# Patient Record
Sex: Female | Born: 1945
Health system: Southern US, Community
[De-identification: ages and names within clinical notes are randomized; demographics above are authoritative.]

## PROBLEM LIST (undated history)

## (undated) DIAGNOSIS — R42 Dizziness and giddiness: Secondary | ICD-10-CM

## (undated) DIAGNOSIS — F329 Major depressive disorder, single episode, unspecified: Secondary | ICD-10-CM

## (undated) DIAGNOSIS — J449 Chronic obstructive pulmonary disease, unspecified: Secondary | ICD-10-CM

## (undated) DIAGNOSIS — F32A Depression, unspecified: Secondary | ICD-10-CM

## (undated) DIAGNOSIS — I071 Rheumatic tricuspid insufficiency: Secondary | ICD-10-CM

## (undated) DIAGNOSIS — D649 Anemia, unspecified: Secondary | ICD-10-CM

## (undated) DIAGNOSIS — A770 Spotted fever due to Rickettsia rickettsii: Secondary | ICD-10-CM

## (undated) DIAGNOSIS — G4730 Sleep apnea, unspecified: Secondary | ICD-10-CM

## (undated) DIAGNOSIS — K219 Gastro-esophageal reflux disease without esophagitis: Secondary | ICD-10-CM

## (undated) DIAGNOSIS — T7840XA Allergy, unspecified, initial encounter: Secondary | ICD-10-CM

## (undated) DIAGNOSIS — M810 Age-related osteoporosis without current pathological fracture: Secondary | ICD-10-CM

## (undated) DIAGNOSIS — I517 Cardiomegaly: Secondary | ICD-10-CM

## (undated) DIAGNOSIS — I272 Pulmonary hypertension, unspecified: Secondary | ICD-10-CM

## (undated) DIAGNOSIS — K761 Chronic passive congestion of liver: Secondary | ICD-10-CM

## (undated) DIAGNOSIS — G4700 Insomnia, unspecified: Secondary | ICD-10-CM

## (undated) DIAGNOSIS — K760 Fatty (change of) liver, not elsewhere classified: Secondary | ICD-10-CM

## (undated) DIAGNOSIS — E785 Hyperlipidemia, unspecified: Secondary | ICD-10-CM

## (undated) DIAGNOSIS — I2721 Secondary pulmonary arterial hypertension: Secondary | ICD-10-CM

## (undated) DIAGNOSIS — G43909 Migraine, unspecified, not intractable, without status migrainosus: Secondary | ICD-10-CM

## (undated) DIAGNOSIS — E119 Type 2 diabetes mellitus without complications: Secondary | ICD-10-CM

## (undated) DIAGNOSIS — I5032 Chronic diastolic (congestive) heart failure: Secondary | ICD-10-CM

## (undated) DIAGNOSIS — G459 Transient cerebral ischemic attack, unspecified: Secondary | ICD-10-CM

## (undated) DIAGNOSIS — F419 Anxiety disorder, unspecified: Secondary | ICD-10-CM

## (undated) DIAGNOSIS — I10 Essential (primary) hypertension: Secondary | ICD-10-CM

## (undated) HISTORY — DX: Type 2 diabetes mellitus without complications: E11.9

## (undated) HISTORY — DX: Hyperlipidemia, unspecified: E78.5

## (undated) HISTORY — DX: Transient cerebral ischemic attack, unspecified: G45.9

## (undated) HISTORY — PX: TUBAL LIGATION: SHX77

## (undated) HISTORY — PX: CORONARY ANGIOPLASTY: SHX604

## (undated) HISTORY — DX: Major depressive disorder, single episode, unspecified: F32.9

## (undated) HISTORY — DX: Migraine, unspecified, not intractable, without status migrainosus: G43.909

## (undated) HISTORY — DX: Anxiety disorder, unspecified: F41.9

## (undated) HISTORY — PX: ABDOMINAL HYSTERECTOMY: SHX81

## (undated) HISTORY — DX: Insomnia, unspecified: G47.00

## (undated) HISTORY — DX: Age-related osteoporosis without current pathological fracture: M81.0

## (undated) HISTORY — DX: Spotted fever due to Rickettsia rickettsii: A77.0

## (undated) HISTORY — DX: Allergy, unspecified, initial encounter: T78.40XA

## (undated) HISTORY — DX: Depression, unspecified: F32.A

## (undated) HISTORY — DX: Anemia, unspecified: D64.9

## (undated) HISTORY — DX: Essential (primary) hypertension: I10

---

## 2004-08-03 ENCOUNTER — Emergency Department: Payer: Self-pay | Admitting: Emergency Medicine

## 2004-10-06 ENCOUNTER — Ambulatory Visit: Payer: Self-pay | Admitting: Family Medicine

## 2005-08-16 ENCOUNTER — Ambulatory Visit: Payer: Self-pay | Admitting: Cardiovascular Disease

## 2007-06-28 DIAGNOSIS — M1990 Unspecified osteoarthritis, unspecified site: Secondary | ICD-10-CM | POA: Insufficient documentation

## 2007-07-11 ENCOUNTER — Ambulatory Visit: Payer: Self-pay | Admitting: Gastroenterology

## 2010-05-29 ENCOUNTER — Ambulatory Visit: Payer: Self-pay | Admitting: Family Medicine

## 2010-07-03 ENCOUNTER — Ambulatory Visit: Payer: Self-pay | Admitting: Internal Medicine

## 2010-07-13 ENCOUNTER — Ambulatory Visit: Payer: Self-pay | Admitting: Unknown Physician Specialty

## 2010-08-05 ENCOUNTER — Ambulatory Visit: Payer: Self-pay | Admitting: Unknown Physician Specialty

## 2011-03-19 ENCOUNTER — Emergency Department: Payer: Self-pay | Admitting: Unknown Physician Specialty

## 2011-11-14 LAB — HM MAMMOGRAPHY

## 2012-02-10 ENCOUNTER — Ambulatory Visit: Payer: Self-pay | Admitting: Medical

## 2012-02-10 ENCOUNTER — Emergency Department: Payer: Self-pay | Admitting: Emergency Medicine

## 2012-02-10 LAB — BASIC METABOLIC PANEL
Anion Gap: 7 (ref 7–16)
Calcium, Total: 9.4 mg/dL (ref 8.5–10.1)
Chloride: 102 mmol/L (ref 98–107)
Co2: 31 mmol/L (ref 21–32)
EGFR (African American): >60
EGFR (Non-African Amer.): >60
Sodium: 140 mmol/L (ref 136–145)

## 2012-02-10 LAB — CBC
HGB: 11.6 g/dL — ABNORMAL LOW (ref 12.0–16.0)
MCH: 29.6 pg (ref 26.0–34.0)
MCHC: 34.1 g/dL (ref 32.0–36.0)
MCV: 87 fL (ref 80–100)
Platelet: 250 10*3/uL (ref 150–440)
RBC: 3.91 10*6/uL (ref 3.80–5.20)

## 2012-02-10 LAB — CK TOTAL AND CKMB (NOT AT ARMC)
CK, Total: 99 U/L (ref 21–215)
CK-MB: <0.5 ng/mL — ABNORMAL LOW (ref 0.5–3.6)

## 2012-02-14 DIAGNOSIS — E1165 Type 2 diabetes mellitus with hyperglycemia: Secondary | ICD-10-CM

## 2012-02-14 HISTORY — DX: Type 2 diabetes mellitus with hyperglycemia: E11.65

## 2012-03-20 DIAGNOSIS — E039 Hypothyroidism, unspecified: Secondary | ICD-10-CM | POA: Insufficient documentation

## 2012-04-17 ENCOUNTER — Observation Stay: Payer: Self-pay | Admitting: Internal Medicine

## 2012-04-17 LAB — CBC
HCT: 33.4 % — ABNORMAL LOW (ref 35.0–47.0)
HGB: 11 g/dL — ABNORMAL LOW (ref 12.0–16.0)
MCH: 28.6 pg (ref 26.0–34.0)
MCHC: 33.1 g/dL (ref 32.0–36.0)
MCV: 86 fL (ref 80–100)
Platelet: 242 10*3/uL (ref 150–440)
RBC: 3.87 10*6/uL (ref 3.80–5.20)
RDW: 14.4 % (ref 11.5–14.5)
WBC: 5 10*3/uL (ref 3.6–11.0)

## 2012-04-17 LAB — COMPREHENSIVE METABOLIC PANEL
Albumin: 3.7 g/dL (ref 3.4–5.0)
Alkaline Phosphatase: 58 U/L (ref 50–136)
Anion Gap: 8 (ref 7–16)
BUN: 12 mg/dL (ref 7–18)
Calcium, Total: 8.8 mg/dL (ref 8.5–10.1)
Creatinine: 0.79 mg/dL (ref 0.60–1.30)
Glucose: 105 mg/dL — ABNORMAL HIGH (ref 65–99)
Osmolality: 287 (ref 275–301)
Potassium: 3.8 mmol/L (ref 3.5–5.1)
Sodium: 144 mmol/L (ref 136–145)
Total Protein: 7.2 g/dL (ref 6.4–8.2)

## 2012-04-17 LAB — URINALYSIS, COMPLETE
Bacteria: NONE SEEN
Bilirubin,UR: NEGATIVE
Blood: NEGATIVE
Glucose,UR: NEGATIVE mg/dL (ref 0–75)
Ketone: NEGATIVE
Nitrite: NEGATIVE
Specific Gravity: 1.024 (ref 1.003–1.030)
Squamous Epithelial: <1
WBC UR: 3 /HPF (ref 0–5)

## 2012-04-17 LAB — PROTIME-INR
INR: 0.9
Prothrombin Time: 12.2 secs (ref 11.5–14.7)

## 2012-04-17 LAB — APTT: Activated PTT: 27.3 secs (ref 23.6–35.9)

## 2012-04-18 LAB — HEMOGLOBIN A1C: Hemoglobin A1C: 6.5 % — ABNORMAL HIGH (ref 4.2–6.3)

## 2012-04-18 LAB — LIPID PANEL
Cholesterol: 185 mg/dL (ref 0–200)
HDL Cholesterol: 42 mg/dL (ref 40–60)
Ldl Cholesterol, Calc: 118 mg/dL — ABNORMAL HIGH (ref 0–100)
Triglycerides: 126 mg/dL (ref 0–200)
VLDL Cholesterol, Calc: 25 mg/dL (ref 5–40)

## 2012-04-18 LAB — CBC WITH DIFFERENTIAL/PLATELET
Basophil #: 0 10*3/uL (ref 0.0–0.1)
Eosinophil #: 0.1 10*3/uL (ref 0.0–0.7)
Eosinophil %: 1.3 %
MCH: 28.8 pg (ref 26.0–34.0)
Monocyte #: 0.3 x10 3/mm (ref 0.2–0.9)
Neutrophil #: 3.2 10*3/uL (ref 1.4–6.5)
Neutrophil %: 64.1 %
Platelet: 215 10*3/uL (ref 150–440)
RBC: 3.47 10*6/uL — ABNORMAL LOW (ref 3.80–5.20)
RDW: 14.5 % (ref 11.5–14.5)
WBC: 4.9 10*3/uL (ref 3.6–11.0)

## 2013-02-13 ENCOUNTER — Ambulatory Visit: Payer: Self-pay | Admitting: Gastroenterology

## 2013-02-18 LAB — HM COLONOSCOPY

## 2013-04-10 ENCOUNTER — Ambulatory Visit: Payer: Self-pay | Admitting: Internal Medicine

## 2013-04-10 LAB — CBC CANCER CENTER
Basophil #: 0 x10 3/mm (ref 0.0–0.1)
Eosinophil #: 0 x10 3/mm (ref 0.0–0.7)
Eosinophil %: 0.6 %
HCT: 35.5 % (ref 35.0–47.0)
Lymphocyte #: 1.8 x10 3/mm (ref 1.0–3.6)
Lymphocyte %: 34.2 %
MCH: 28.7 pg (ref 26.0–34.0)
MCHC: 32.5 g/dL (ref 32.0–36.0)
MCV: 88 fL (ref 80–100)
Monocyte #: 0.3 x10 3/mm (ref 0.2–0.9)
Monocyte %: 4.7 %
Neutrophil #: 3.2 x10 3/mm (ref 1.4–6.5)
Neutrophil %: 60.2 %
Platelet: 274 x10 3/mm (ref 150–440)
RBC: 4.02 10*6/uL (ref 3.80–5.20)
RDW: 14.3 % (ref 11.5–14.5)

## 2013-04-10 LAB — RETICULOCYTES
Absolute Retic Count: 0.0666 10*6/uL (ref 0.019–0.186)
Reticulocyte: 1.66 % (ref 0.4–3.1)

## 2013-05-06 ENCOUNTER — Ambulatory Visit: Payer: Self-pay | Admitting: Internal Medicine

## 2013-11-25 DIAGNOSIS — R9431 Abnormal electrocardiogram [ECG] [EKG]: Secondary | ICD-10-CM

## 2013-11-25 DIAGNOSIS — R002 Palpitations: Secondary | ICD-10-CM

## 2013-11-25 HISTORY — DX: Abnormal electrocardiogram (ECG) (EKG): R94.31

## 2013-11-25 HISTORY — DX: Palpitations: R00.2

## 2014-04-18 ENCOUNTER — Ambulatory Visit: Payer: Self-pay | Admitting: Family Medicine

## 2014-04-24 LAB — HM DIABETES EYE EXAM

## 2014-04-29 ENCOUNTER — Telehealth: Payer: Self-pay | Admitting: *Deleted

## 2014-04-29 ENCOUNTER — Encounter: Payer: Self-pay | Admitting: Podiatry

## 2014-04-29 ENCOUNTER — Ambulatory Visit (INDEPENDENT_AMBULATORY_CARE_PROVIDER_SITE_OTHER): Payer: Medicare Other | Admitting: Podiatry

## 2014-04-29 ENCOUNTER — Ambulatory Visit (INDEPENDENT_AMBULATORY_CARE_PROVIDER_SITE_OTHER): Payer: BC Managed Care – PPO

## 2014-04-29 VITALS — BP 144/86 | HR 57 | Resp 16 | Ht 67.0 in | Wt 181.0 lb

## 2014-04-29 DIAGNOSIS — E119 Type 2 diabetes mellitus without complications: Secondary | ICD-10-CM

## 2014-04-29 DIAGNOSIS — M722 Plantar fascial fibromatosis: Secondary | ICD-10-CM

## 2014-04-29 NOTE — Progress Notes (Signed)
Subjective:    Patient ID: MIKELL CAMP, female    DOB: 04-05-1946, 69 y.o.   MRN: [id number]  HPI Comments: 68 year old female presents the office today with complaints of a cyst on the bottom of her left foot which has been present for proximally 2 weeks. She does state the area has decreased in size to the last couple weeks and the pain is resolving. She does that she continues have mild discomfort to the area particularly with pressure. She has not had any prior treatment. Due to her history diabetes she is concerned about possible infection. She denies any redness or any skin changes overlying the area. She denies any systemic complaints as fevers, chills, nausea, vomiting. No other complaints at this time.  Foot Pain Associated symptoms include fatigue.      Review of Systems  Constitutional: Positive for fatigue.  All other systems reviewed and are negative.      Objective:   Physical Exam  AAO 3, NAD DP/PT pulses palpable bilaterally, CRT less than 3 seconds Protective sensation intact with Simms Weinstein monofilament, vibratory sensation intact, Achilles tendon reflex intact. There is a small, firm, mobile soft tissue mass within the plantar aspect of the left forefoot just proximal to the metatarsal heads. There is no fluctuance or crepitus identified. There is no overlying skin ulceration. No overlying erythema, edema, streaking. There is no clinical signs of infection. There is mild tenderness upon deep palpation to the area. No other areas identified. No open lesions. MMT 5/5, ROM WNL      Assessment & Plan:  68 year old female left foot plantar soft tissue mass -X-rays were obtained and reviewed with the patient. -Conservative versus surgical treatment discussed including alternatives, risks, complications. -Patient is concerned about what the actual mass is. It feels like it is a fibroma. We'll obtain an ultrasound to further evaluate the mass. Discussed with the  patient to monitor for any clinical signs or symptoms of infection and directed to call the office if any are to occur or go directly to the emergency room. -Dispensed offloading pads to help alleviate the pressure while walking/standing. -Follow-up after ultrasound. In the meantime, call the office with any questions, concerns, changes symptoms.

## 2014-04-29 NOTE — Patient Instructions (Signed)
Monitor for any signs/symptoms of infection. Call the office immediately if any occur or go directly to the emergency room. Call with any questions/concerns.

## 2014-04-29 NOTE — Telephone Encounter (Signed)
Called and left message for pt letting her know ultrasound sch for 12.1.15 at 3:15. Arrival time 3:00 [place] location. [address] - [facility].

## 2014-05-06 ENCOUNTER — Ambulatory Visit: Payer: Self-pay | Admitting: Family Medicine

## 2014-05-06 ENCOUNTER — Ambulatory Visit: Payer: Self-pay | Admitting: Podiatry

## 2014-05-08 ENCOUNTER — Encounter: Payer: Self-pay | Admitting: Podiatry

## 2014-05-13 ENCOUNTER — Ambulatory Visit (INDEPENDENT_AMBULATORY_CARE_PROVIDER_SITE_OTHER): Payer: BC Managed Care – PPO | Admitting: Podiatry

## 2014-05-13 VITALS — BP 134/68 | HR 66 | Resp 16

## 2014-05-13 DIAGNOSIS — E119 Type 2 diabetes mellitus without complications: Secondary | ICD-10-CM

## 2014-05-13 DIAGNOSIS — M722 Plantar fascial fibromatosis: Secondary | ICD-10-CM

## 2014-05-13 NOTE — Patient Instructions (Signed)
Monitor the mass for any changes. If there is any increase in size, pain or skin changes over the area call the office.

## 2014-05-13 NOTE — Progress Notes (Signed)
Patient ID: Jamie Murray, female   DOB: 11/14/1945, 68 y.o.   MRN: [id number]  Subjective: 68 year old female returns the office they for follow-up evaluation to discuss results of the ultrasound. She states that since last appointment the area has decreased in size and swelling and she only has pain to the area with direct pressure over the mass. She denies stepping on any foreign objects and she states that she routinely checks her feet every day to ensure this. No acute changes since last appointment. No other complaints at this time. Denies any systemic complaints as fevers, chills, nausea, vomiting.  Objective: AAO x3, NAD DP/PT pulses palpable bilaterally, CRT less than 3 seconds Protective sensation intact with Simms Weinstein monofilament, vibratory sensation intact, Achilles tendon reflex intact Small firm mobile soft tissue mass in the plantar aspect left forefoot 15 the first and second metatarsal heads proximally. Overlying skin is intact for any skin changes. The mass is appear to be somewhat smaller compared to prior appointment. There is only pain with direct palpation of the mass. There is no areas of fluctuance, crepitus. There is no overlying erythema, increase in warmth. Patient is able to wear regular shoes.difficulty and presents today wearing a high-heeled shoe. MMT 5/5, ROM WNL No open lesions or pre-ulcerative lesions. No pain with calf compression, swelling, warmth, erythema.  Assessment: 68 year old female with left plantar soft tissue mass.  Plan: -Ultrasound results were reviewed with the patient. Ultrasound revealed possible foreign body reaction (which the patient adamantly states that she did not step on any objects and there is no signs of a foreign body on x-ray), versus inclusion cyst. A morton neuroma or fibroma is less likely. Discussed with the patient we can obtain an MRI to further evaluate the area. However at this time. This appear to be improving symptoms  will hold off at this time. Discussed the patient to continue to monitor the area for any changes in the call the office should any occur. Discussed with her that if the area becomes more painful, increase in size, any overlying skin changes toe and is now and we will order an MRI. -Dispensed offloading pads to help protect the area. -Follow-up as needed. In the meantime, call the office with any questions, concerns, change in symptoms.

## 2014-06-06 ENCOUNTER — Ambulatory Visit: Payer: Self-pay | Admitting: Family Medicine

## 2014-06-06 DIAGNOSIS — G459 Transient cerebral ischemic attack, unspecified: Secondary | ICD-10-CM

## 2014-06-06 HISTORY — DX: Transient cerebral ischemic attack, unspecified: G45.9

## 2014-09-23 NOTE — H&P (Signed)
PATIENT NAME:  Jamie Murray, Jamie Murray MR#:  [id number] DATE OF BIRTH:  1946/03/30  DATE OF ADMISSION:  04/17/2012  PRIMARY CARE PHYSICIAN: [facility]  ER PHYSICIAN: Marta Antu, MD  CHIEF COMPLAINT: Slurred speech and confusion.   HISTORY OF PRESENT ILLNESS: A 69 year old female with history of hypertension and diabetes who came because the patient had an episode of slurred speech, confusion  ____________________________ Epifanio Lesches, MD sk:slb D: 04/17/2012 14:30:00 ET T: 04/17/2012 14:49:47 ET JOB#: [id number]  cc: Epifanio Lesches, MD, <Dictator> Epifanio Lesches MD ELECTRONICALLY SIGNED 05/22/2012 14:49

## 2014-09-23 NOTE — H&P (Signed)
PATIENT NAME:  Jamie Murray, ROYAL MR#:  [id number] DATE OF BIRTH:  [birth year]  DATE OF ADMISSION:  04/17/2012  PRIMARY CARE PHYSICIAN: Golden Pop, MD  ER PHYSICIAN: Marta Antu, MD  CHIEF COMPLAINT: Altered mental status, slurred speech, and headache.   HISTORY OF PRESENT ILLNESS: This is a 69 year old female with hypertension and diabetes brought by the daughter. The patient and the daughter was in [place] on Sunday and after eating dinner Sunday night the patient started to have sudden onset of confusion and her speech was slurred and also not right, according to the daughter. The patient was noticed to have weakness and numbness on the left hand which lasted about 15 minutes. After that the patient's speech was clear and she slept okay and then the family flew back to [facility] yesterday morning. The  patient says that she ha been having headaches since Sunday and she felt dizzy Sunday night. She did not have any blurred vision and speech was slurred and weakness of the left hand. The patient denies any loss of consciousness, no seizure activity, no deviation of mouth, and no fever. The patient's blood sugar was 220, according to the daughter.  PAST MEDICAL HISTORY:  1. Hypertension. 2. Diabetes.  3. Hypothyroidism.  ALLERGIES: No known drug allergies.   SOCIAL HISTORY: No smoking, no drinking, and no drugs.   PAST SURGICAL HISTORY: Hysterectomy.   FAMILY HISTORY: No hypertension or diabetes.   MEDICATIONS:  1. Aspirin 81 mg daily.  2. HCTZ/Losartan 12.5/150 mg p.o. twice a day.  3. Januvia 100 mg p.o. daily.  4. Levothyroxine 50 mcg p.o. daily.  5. Metformin 1 gram p.o. twice a day. 6. Multivitamin 1 tablet daily.  7. Trazodone 100 mg p.o. daily. 8. Vitamin B12 1,000 mcg p.o. daily.  9. Ambien 10 mg daily.   REVIEW OF SYSTEMS: CONSTITUTIONAL: Has no fever but complains of fatigue. EYES: No blurred vision. ENT: No tinnitus. No epistaxis. No difficulty swallowing. RESPIRATORY: No  cough. No wheezing. CARDIOVASCULAR: No chest pain. No orthopnea. GASTROINTESTINAL: Feels nauseous all the time and poor p.o. intake. GENITOURINARY: No dysuria. ENDOCRINE: No polyuria or nocturia. INTEGUMENTARY: No skin dryness. MUSCULOSKELETAL: No joint pain. NEUROLOGIC: Had numbness in the left hand on Sunday which is resolved. Now no weakness. Had dysarthria on Sunday. No epilepsy. No tremor, vertigo, or ataxia. The patient has been complaining of headache for last two days. PSYCH: No anxiety or insomnia.   PHYSICAL EXAMINATION:   VITAL SIGNS: Temperature 97.4, pulse 75, respirations 20, blood pressure 140/74, and saturation 98% on room air.  GENERAL: Alert, awake, and oriented.   HEAD/EYES: Head atraumatic, normocephalic. Pupils are equally reacting to light. Extraocular movements intact.   ENT: No tympanic membrane congestion. No turbinate hypertrophy. No oropharyngeal erythema.   NECK: Normal range of motion. No JVD. No carotid bruit.   CARDIOVASCULAR: S1 and S2 regular. No murmurs. PMI is not displaced. Cranial nerves are intact.  PULMONARY: Lungs are clear to auscultation. No wheeze. No rales.   ABDOMEN: Soft, nontender, and nondistended. Bowel sounds present. No hernia.  EXTREMITIES: No extremity edema. No cyanosis. No clubbing.   NEUROLOGIC: She is alert, awake, and oriented. Cranial nerves II through XII are intact. Power is 5 out of 5 in upper and lower extremities. Sensation is intact. Deep tendon reflexes are 2+ bilaterally. Finger-nose test is intact.   PSYCH: Mood and affect are within normal limits.   LABORATORY, DIAGNOSTIC AND RADIOLOGIC DATA: CT of head showed no acute intracranial process.  WBC 5, hemoglobin 11, hematocrit 33.4, and platelets 242.   Electrolytes: Sodium 144, potassium 3.8, chloride 107, bicarbonate 29, BUN 12, creatinine 0.79, and glucose 105.   LFTs within normal limits.   Urinalysis is clear, 1+ leukocyte esterase.   EKG: I ordered one and is  not done yet.   ASSESSMENT AND PLAN:  80. A 69 year old female patient with severe headache and confusion on Sunday. The patient does not have any confusion or slurred speech here, but still has headache. The patient's symptoms of sudden onset of slurred speech with weakness on the left side is resolved now concerning for transient ischemic attack and has risk factors of hypertension and diabetes. Admit her overnight, continue aspirin, and check fasting lipase. Get MRI, EKG, and an echocardiogram.  2. Diabetes mellitus type II. Continue the home medication, Januvia, along with metformin.  3. Hypothyroidism. Continue Synthroid.  4. History of hypertension. Continue HCTZ/losartan 12.5/150 mg daily. The patient had an echocardiogram and a stress test in September with Dr. Neoma Laming. According to her, they were normal, so we will get those records from them.      I discussed the plan with the patient's daughter.   TIME SPENT: About 55 minutes.  ____________________________ Epifanio Lesches, MD sk:slb D: 04/17/2012 14:37:41 ET T: 04/17/2012 15:01:19 ET JOB#: [id number]  cc: Epifanio Lesches, MD, <Dictator> Guadalupe Maple, MD Epifanio Lesches MD ELECTRONICALLY SIGNED 05/22/2012 14:54

## 2014-09-23 NOTE — Discharge Summary (Signed)
PATIENT NAME:  Jamie Murray, Jamie Murray MR#:  [id number] DATE OF BIRTH:  1945/12/17  DATE OF ADMISSION:  04/17/2012 DATE OF DISCHARGE:  04/18/2012  PRIMARY CARE PHYSICIAN: Clayborn Bigness, MD  DISCHARGE DIAGNOSES:  1. Transient ischemic attack.  2. Hypertension.  3. Hyperlipidemia.   IMAGING STUDIES: MRI of the brain showed no acute stroke, mass, or bleed.   CT scan of the head showed no acute intracranial abnormality.   Ultrasound of the carotids showed no significant stenosis.   Recent 2-D echocardiogram done as an outpatient showed no significant valvular disease, ejection fraction greater than 55%, no PFO or intraluminal clots.   ADMITTING HISTORY AND PHYSICAL: Please see detailed history and physical dictated on 04/17/2012. In brief, this is a 69 year old African American female patient with history of hypertension and diabetes who presented to the Emergency Room complaining of slurred speech, headache, and confusion which had resolved prior to admission. The patient was admitted secondary to her advanced age and risk factors for further work-up transient ischemic attack/cerebrovascular accident.   HOSPITAL COURSE:  1. Transient ischemic attack. The patient's symptoms resolved and did not have any recurrence during the hospital stay. Telemetry did not show any arrhythmias. A 2-D echocardiogram done recently showed no acute abnormalities. Carotid Dopplers and MRI have been normal. The patient was diagnosed with transient ischemic attack and started on aspirin and statin with Zocor. LDL was 118, fasting. The patient did not need any physical therapy services,  was deep vein thrombosis prophylaxis during the hospital stay.  2. Hypertension and diabetes were controlled during the hospital stay.  At the time of discharge, temperature is 98.4, blood pressure 112/71, heart rate 60, and is being discharged home with normal neurological examination.   DISCHARGE MEDICATIONS:  1. Aspirin 81 mg oral once a day.   2. Zocor 40 mg oral once a day.  3. Trazodone 100 mg oral once a day.  4. Januvia 100 mg oral once a day.  5. Hydrochlorothiazide/losartan 12.5/150 mg oral twice a day.  6. Metformin 500 mg 2 tablets oral twice a day. 7. Zolpidem 10 mg oral once a day at bedtime.  8. Levothyroxine 50 mcg oral once a day.  9. Vitamin B12 1000 mg oral once a day.  10. Multivitamin 1 tablet oral once a day.   DISCHARGE INSTRUCTIONS: The patient will be on a low-salt, low-fat diabetic diet. Activity as tolerated. Follow-up with primary care physician, Dr. Clayborn Bigness, in 1 to 2 weeks. The patient has been asked to call her doctor or return to the Emergency Room if she notices any similar symptoms or focal weakness of stroke. This plan was discussed with the patient and her husband at bedside who verbalized understanding and are okay with the plan.   TIME SPENT: Time spent today on the day of discharge and coordination was 35 minutes. ____________________________ Leia Alf Fynley Chrystal, MD srs:slb D: 04/18/2012 14:23:32 ET T: 04/18/2012 15:19:03 ET JOB#: [id number]  cc: Alveta Heimlich R. Darvin Neighbours, MD, <Dictator> Lavera Guise, MD Neita Carp MD ELECTRONICALLY SIGNED 04/21/2012 9:01

## 2014-09-24 ENCOUNTER — Other Ambulatory Visit: Payer: Self-pay | Admitting: Unknown Physician Specialty

## 2014-09-24 DIAGNOSIS — R42 Dizziness and giddiness: Secondary | ICD-10-CM

## 2014-09-25 ENCOUNTER — Ambulatory Visit
Admit: 2014-09-25 | Disposition: A | Discharge status: Home / Self Care | Payer: Self-pay | Attending: Unknown Physician Specialty | Admitting: Unknown Physician Specialty

## 2014-09-25 LAB — CREATININE, SERUM
Creatinine: 0.87 mg/dL
EGFR (African American): >60
EGFR (Non-African Amer.): >60

## 2014-10-06 DIAGNOSIS — R42 Dizziness and giddiness: Secondary | ICD-10-CM | POA: Insufficient documentation

## 2014-10-06 DIAGNOSIS — G479 Sleep disorder, unspecified: Secondary | ICD-10-CM | POA: Insufficient documentation

## 2014-10-06 DIAGNOSIS — G4700 Insomnia, unspecified: Secondary | ICD-10-CM | POA: Insufficient documentation

## 2014-10-06 DIAGNOSIS — G44221 Chronic tension-type headache, intractable: Secondary | ICD-10-CM | POA: Insufficient documentation

## 2014-10-09 DIAGNOSIS — R0602 Shortness of breath: Secondary | ICD-10-CM

## 2014-11-26 ENCOUNTER — Other Ambulatory Visit: Payer: Self-pay | Admitting: Unknown Physician Specialty

## 2014-12-31 ENCOUNTER — Other Ambulatory Visit: Payer: Self-pay

## 2014-12-31 MED ORDER — IRBESARTAN-HYDROCHLOROTHIAZIDE 150-12.5 MG PO TABS: 1.0000 | ORAL_TABLET | Freq: Every day | ORAL | Status: DC

## 2014-12-31 NOTE — Telephone Encounter (Signed)
Patient was last seen on 10/08/14, practice partner number is [phone], and pharmacy is [facility].

## 2015-01-26 ENCOUNTER — Telehealth: Payer: Self-pay

## 2015-01-26 DIAGNOSIS — R42 Dizziness and giddiness: Secondary | ICD-10-CM

## 2015-01-26 DIAGNOSIS — F32A Depression, unspecified: Secondary | ICD-10-CM

## 2015-01-26 DIAGNOSIS — F410 Panic disorder [episodic paroxysmal anxiety] without agoraphobia: Secondary | ICD-10-CM

## 2015-01-26 DIAGNOSIS — E119 Type 2 diabetes mellitus without complications: Secondary | ICD-10-CM

## 2015-01-26 DIAGNOSIS — E039 Hypothyroidism, unspecified: Secondary | ICD-10-CM

## 2015-01-26 DIAGNOSIS — G43909 Migraine, unspecified, not intractable, without status migrainosus: Secondary | ICD-10-CM | POA: Insufficient documentation

## 2015-01-26 DIAGNOSIS — G4700 Insomnia, unspecified: Secondary | ICD-10-CM

## 2015-01-26 DIAGNOSIS — I639 Cerebral infarction, unspecified: Secondary | ICD-10-CM | POA: Insufficient documentation

## 2015-01-26 DIAGNOSIS — D509 Iron deficiency anemia, unspecified: Secondary | ICD-10-CM

## 2015-01-26 DIAGNOSIS — F329 Major depressive disorder, single episode, unspecified: Secondary | ICD-10-CM

## 2015-01-26 DIAGNOSIS — J309 Allergic rhinitis, unspecified: Secondary | ICD-10-CM

## 2015-01-26 DIAGNOSIS — F418 Other specified anxiety disorders: Secondary | ICD-10-CM | POA: Insufficient documentation

## 2015-01-26 DIAGNOSIS — F419 Anxiety disorder, unspecified: Secondary | ICD-10-CM

## 2015-01-26 DIAGNOSIS — E785 Hyperlipidemia, unspecified: Secondary | ICD-10-CM | POA: Insufficient documentation

## 2015-01-26 DIAGNOSIS — I10 Essential (primary) hypertension: Secondary | ICD-10-CM | POA: Insufficient documentation

## 2015-01-26 DIAGNOSIS — M810 Age-related osteoporosis without current pathological fracture: Secondary | ICD-10-CM

## 2015-01-26 HISTORY — DX: Cerebral infarction, unspecified: I63.9

## 2015-01-26 MED ORDER — IRBESARTAN-HYDROCHLOROTHIAZIDE 150-12.5 MG PO TABS
2.0000 | ORAL_TABLET | Freq: Every day | ORAL | Status: DC
Start: 2015-01-26 — End: 2015-09-07

## 2015-01-26 NOTE — Telephone Encounter (Signed)
Pharmacy called stating that the patient called about her irbesartan hctz. Patient states she has always taken twice a day but the new rx says to only take one a day. Practice partner number is [phone]. If it is supposed to be twice a day, they just need a new rx sent into [facility].

## 2015-01-27 ENCOUNTER — Ambulatory Visit (INDEPENDENT_AMBULATORY_CARE_PROVIDER_SITE_OTHER): Payer: Medicare Other | Admitting: Unknown Physician Specialty

## 2015-01-27 ENCOUNTER — Encounter: Payer: Self-pay | Admitting: Unknown Physician Specialty

## 2015-01-27 VITALS — BP 126/74 | HR 49 | Temp 98.0°F | Ht 66.0 in | Wt 174.8 lb

## 2015-01-27 DIAGNOSIS — E785 Hyperlipidemia, unspecified: Secondary | ICD-10-CM

## 2015-01-27 DIAGNOSIS — I10 Essential (primary) hypertension: Secondary | ICD-10-CM

## 2015-01-27 DIAGNOSIS — E119 Type 2 diabetes mellitus without complications: Secondary | ICD-10-CM

## 2015-01-27 DIAGNOSIS — G4700 Insomnia, unspecified: Secondary | ICD-10-CM | POA: Diagnosis not present

## 2015-01-27 LAB — MICROALBUMIN, URINE WAIVED
Creatinine, Urine Waived: 100 mg/dL (ref 10–300)
Microalb, Ur Waived: 30 mg/L — ABNORMAL HIGH (ref 0–19)
Microalb/Creat Ratio: <30 mg/g (ref <30)

## 2015-01-27 LAB — BAYER DCA HB A1C WAIVED: HB A1C (BAYER DCA - WAIVED): 7.2 % — ABNORMAL HIGH (ref <7.0)

## 2015-01-27 MED ORDER — LIRAGLUTIDE 18 MG/3ML ~~LOC~~ SOPN: 1.8000 mg | PEN_INJECTOR | Freq: Every morning | SUBCUTANEOUS | Status: DC

## 2015-01-27 NOTE — Progress Notes (Signed)
BP 126/74 mmHg  Pulse 49  Temp(Src) 98 F (36.7 C)  Ht _0  (1.676 m)  Wt 174 lb 12.8 oz (79.289 kg)  BMI 28.23 kg/m2  SpO2 99%  LMP  (LMP Unknown)   Subjective:    Patient ID: Jamie Murray, female    DOB: 09-Mar-1946, 69 y.o.   MRN: [id number]  HPI: Jamie Murray is a 69 y.o. female  Chief Complaint  Patient presents with  . Diabetes  . Hyperlipidemia  . Hypertension    Relevant past medical, surgical, family and social history reviewed and updated as indicated. Interim medical history since our last visit reviewed. Allergies and medications reviewed and updated.  Hypertension/Hyperlipidemia: Patient takes blood pressure medication twice per day. She checks her blood pressure a few times each week. Last week it was running high in 559'R systolic. Today it is within normal limits. She denies headaches, chest pain or shortness of breath.   Diabetes: She reports not being able to afford Trulicity on a regular basis. She took it for three months but has not been back on it. She checks her Blood glucose twice daily, morning sugars have been high in 120's to 130. She would like to get her A1C back in the 6.0 - 6.5 range.   Insomnia: She reports feeling better since starting trazadone. She no longer has a "foggy" feeling the next day.     Review of Systems  Constitutional: Negative.  Negative for activity change, appetite change and fatigue.  HENT: Negative.   Respiratory: Negative.  Negative for cough, chest tightness, shortness of breath, wheezing and stridor.   Cardiovascular: Negative.  Negative for chest pain, palpitations and leg swelling.  Genitourinary: Negative.  Negative for dysuria and frequency.  Skin: Negative.  Negative for color change, pallor, rash and wound.  Neurological: Negative.  Negative for dizziness, weakness, light-headedness, numbness and headaches.  Psychiatric/Behavioral: Negative.     Per HPI unless specifically indicated  above     Objective:    BP 126/74 mmHg  Pulse 49  Temp(Src) 98 F (36.7 C)  Ht _1  (1.676 m)  Wt 174 lb 12.8 oz (79.289 kg)  BMI 28.23 kg/m2  SpO2 99%  LMP  (LMP Unknown)  Wt Readings from Last 3 Encounters:  01/27/15 174 lb 12.8 oz (79.289 kg)  10/08/14 177 lb (80.287 kg)  04/29/14 181 lb (82.101 kg)    Physical Exam  Constitutional: She is oriented to person, place, and time. She appears well-developed and well-nourished. No distress.  HENT:  Head: Normocephalic and atraumatic.  Cardiovascular: Normal rate and regular rhythm.  Exam reveals no gallop and no friction rub.   No murmur heard. Pulmonary/Chest: Effort normal and breath sounds normal. No respiratory distress. She has no wheezes. She has no rales. She exhibits no tenderness.  Neurological: She is alert and oriented to person, place, and time.  Skin: Skin is warm and dry. No rash noted. She is not diaphoretic. No erythema. No pallor.  Psychiatric: She has a normal mood and affect. Her behavior is normal. Judgment and thought content normal.        Assessment & Plan:   Problem List Items Addressed This Visit      Unprioritized   Insomnia    Currently taking trazadone 152m nightly. Wakes with no residual feeling.      Hypertension    Blood Pressure well controlled on irbesartan-HCTZ 150-12.568mtwice daily.      Relevant Orders   Microalbumin, Urine  Waived   Uric acid   Comprehensive metabolic panel   Diabetes - Primary    A1C today is 7.2      Relevant Medications   Liraglutide (VICTOZA) 18 MG/3ML SOPN   Other Relevant Orders   Microalbumin, Urine Waived   Bayer DCA Hb A1c Waived   Hyperlipidemia    Last lipid panel in May was normal. Recheck in 3 months.          Follow up plan: Follow up in 3 months

## 2015-01-27 NOTE — Assessment & Plan Note (Signed)
Last lipid panel in May was normal. Recheck in 3 months.

## 2015-01-27 NOTE — Assessment & Plan Note (Addendum)
A1C today is 7.2. Started on Victoza. Start at 0.41m daily for two weeks then increase to 1.268mdaily. Recheck in 3 months

## 2015-01-27 NOTE — Patient Instructions (Signed)
Victoza.  Directon are 1 .8 mgs but take    mgs for 2 weeks and then 1.2 mgs.

## 2015-01-27 NOTE — Assessment & Plan Note (Addendum)
Blood Pressure well controlled on irbesartan-HCTZ 150-12.42m twice daily.

## 2015-01-27 NOTE — Assessment & Plan Note (Signed)
Currently taking trazadone 153m nightly. Wakes with no residual feeling.

## 2015-01-28 ENCOUNTER — Encounter: Payer: Self-pay | Admitting: Family Medicine

## 2015-01-28 LAB — URIC ACID: Uric Acid: 6.3 mg/dL (ref 2.5–7.1)

## 2015-02-02 ENCOUNTER — Other Ambulatory Visit: Payer: Self-pay

## 2015-02-02 MED ORDER — METFORMIN HCL ER 500 MG PO TB24: 500.0000 mg | ORAL_TABLET | Freq: Four times a day (QID) | ORAL | Status: DC

## 2015-02-02 NOTE — Telephone Encounter (Signed)
Patient was last seen 01/27/15 and pharmacy is [facility].

## 2015-02-04 ENCOUNTER — Telehealth: Payer: Self-pay | Admitting: Unknown Physician Specialty

## 2015-02-04 LAB — COMPREHENSIVE METABOLIC PANEL
A/G RATIO: 2 (ref 1.1–2.5)
ALK PHOS: 69 IU/L (ref 39–117)
ALT: 14 IU/L (ref 0–32)
AST: 14 IU/L (ref 0–40)
Albumin: 4.3 g/dL (ref 3.6–4.8)
BUN/Creatinine Ratio: 11 (ref 11–26)
BUN: 10 mg/dL (ref 8–27)
Bilirubin Total: <0.2 mg/dL (ref 0.0–1.2)
CO2: 19 mmol/L (ref 18–29)
Calcium: 9.2 mg/dL (ref 8.7–10.3)
Chloride: 101 mmol/L (ref 97–108)
Creatinine, Ser: 0.92 mg/dL (ref 0.57–1.00)
GFR calc Af Amer: 74 mL/min/{1.73_m2} (ref >59)
GFR calc non Af Amer: 64 mL/min/{1.73_m2} (ref >59)
GLUCOSE: 162 mg/dL — AB (ref 65–99)
Globulin, Total: 2.1 g/dL (ref 1.5–4.5)
POTASSIUM: 4 mmol/L (ref 3.5–5.2)
Sodium: 143 mmol/L (ref 134–144)
Total Protein: 6.4 g/dL (ref 6.0–8.5)

## 2015-02-04 LAB — SPECIMEN STATUS REPORT

## 2015-02-04 NOTE — Telephone Encounter (Signed)
Is there anything you recommend that I tell this patient before I call her?

## 2015-02-04 NOTE — Telephone Encounter (Signed)
I don't know what her questions are.

## 2015-02-04 NOTE — Telephone Encounter (Signed)
Pt called has questions about how to use Victoza. Please call pt ASAP. Pt does not know how to use the medication. Pt stated she is scared she does not like needles. Thanks.

## 2015-02-06 ENCOUNTER — Telehealth: Payer: Self-pay | Admitting: Unknown Physician Specialty

## 2015-02-06 MED ORDER — DULAGLUTIDE 1.5 MG/0.5ML ~~LOC~~ SOAJ: 1.5000 mg | SUBCUTANEOUS | Status: DC

## 2015-02-06 NOTE — Telephone Encounter (Signed)
I routed the message the first time she called to [place] but Malachy Mood must not have gotten it. Patient wants to know how to use Victoza and stated she is afraid of needles. Im not sure what to tell her as far as how to use this. Malachy Mood, can you please give me some sort of instructions on what I can tell this patient?

## 2015-02-06 NOTE — Telephone Encounter (Signed)
Called and let patient know new rx was sent.

## 2015-02-06 NOTE — Telephone Encounter (Signed)
Patient returned call and stated that she cannot do the needles for the [place]. Patient stated she used to be on trulicity but stopped because of the cost. Patient stated she is willing to pay the extra cost so she does not have to deal with needles if Malachy Mood will prescribe the trulicity. Pharmacy is [facility].

## 2015-02-06 NOTE — Telephone Encounter (Signed)
Called and left patient a voicemail asking for her to return my call.

## 2015-02-06 NOTE — Telephone Encounter (Signed)
Trulicity is written

## 2015-02-06 NOTE — Telephone Encounter (Signed)
I can't anticipate her question until we ask her

## 2015-02-06 NOTE — Telephone Encounter (Signed)
Pt called wondering why no one has called her back about her Victoza.  Can someone please call her today?  She has an appt at 1:30 so either before or after that would be good for her.

## 2015-02-25 ENCOUNTER — Ambulatory Visit: Payer: Medicare Other

## 2015-02-25 DIAGNOSIS — Z23 Encounter for immunization: Secondary | ICD-10-CM

## 2015-03-31 ENCOUNTER — Encounter: Payer: Self-pay | Admitting: Family Medicine

## 2015-03-31 ENCOUNTER — Ambulatory Visit (INDEPENDENT_AMBULATORY_CARE_PROVIDER_SITE_OTHER): Payer: Medicare Other | Admitting: Family Medicine

## 2015-03-31 VITALS — BP 119/67 | HR 66 | Temp 97.8°F | Ht 66.0 in | Wt 173.0 lb

## 2015-03-31 DIAGNOSIS — J309 Allergic rhinitis, unspecified: Secondary | ICD-10-CM | POA: Diagnosis not present

## 2015-03-31 DIAGNOSIS — E119 Type 2 diabetes mellitus without complications: Secondary | ICD-10-CM | POA: Diagnosis not present

## 2015-03-31 DIAGNOSIS — Z1239 Encounter for other screening for malignant neoplasm of breast: Secondary | ICD-10-CM

## 2015-03-31 NOTE — Assessment & Plan Note (Signed)
Discuss patient with normal thyroid and no signs symptoms of problems from Trulicity Discuss allergy care and treatment use of OTC medications

## 2015-03-31 NOTE — Progress Notes (Signed)
BP 119/67 mmHg  Pulse 66  Temp(Src) 97.8 F (36.6 C)  Ht _0  (1.676 m)  Wt 173 lb (78.472 kg)  BMI 27.94 kg/m2  LMP  (LMP Unknown)   Subjective:    Patient ID: Jamie Murray, female    DOB: 08-03-1945, 69 y.o.   MRN: [id number]  HPI: Jamie Murray is a 69 y.o. female  Chief Complaint  Patient presents with  . Adenopathy    bilateral   Patient concerned may be side effect from Trulicity . Patient's been having these symptoms for 4-5 days also some sinus congestion and runny nose and allergic shiners. Patient has not tried any medication. No fever or chills cough cold Is really concerned may be the neck issues related to Trulicity.  Relevant past medical, surgical, family and social history reviewed and updated as indicated. Interim medical history since our last visit reviewed. Allergies and medications reviewed and updated.  Review of Systems  Constitutional: Negative.   Respiratory: Negative.   Cardiovascular: Negative.     Per HPI unless specifically indicated above     Objective:    BP 119/67 mmHg  Pulse 66  Temp(Src) 97.8 F (36.6 C)  Ht _1  (1.676 m)  Wt 173 lb (78.472 kg)  BMI 27.94 kg/m2  LMP  (LMP Unknown)  Wt Readings from Last 3 Encounters:  03/31/15 173 lb (78.472 kg)  01/27/15 174 lb 12.8 oz (79.289 kg)  10/08/14 177 lb (80.287 kg)    Physical Exam  Constitutional: She is oriented to person, place, and time. She appears well-developed and well-nourished. No distress.  HENT:  Head: Normocephalic and atraumatic.  Right Ear: Hearing normal.  Left Ear: Hearing normal.  Nose: Nose normal.  Eyes: Conjunctivae and lids are normal. Right eye exhibits no discharge. Left eye exhibits no discharge. No scleral icterus.  Neck: No tracheal deviation present. No thyromegaly present.  Mildly prominent and tender cervical adenopathy Throat inflamed with cobblestone pattern of allergies  Pulmonary/Chest: Effort normal. No respiratory  distress.  Musculoskeletal: Normal range of motion.  Lymphadenopathy:    She has cervical adenopathy.  Neurological: She is alert and oriented to person, place, and time.  Skin: Skin is intact. No rash noted.  Psychiatric: She has a normal mood and affect. Her speech is normal and behavior is normal. Judgment and thought content normal. Cognition and memory are normal.    Results for orders placed or performed in visit on 01/27/15  Microalbumin, Urine Waived  Result Value Ref Range   Microalb, Ur Waived 30 (H) 0 - 19 mg/L   Creatinine, Urine Waived 100 10 - 300 mg/dL   Microalb/Creat Ratio <30 <30 mg/g  Uric acid  Result Value Ref Range   Uric Acid 6.3 2.5 - 7.1 mg/dL  Bayer DCA Hb A1c Waived  Result Value Ref Range   Bayer DCA Hb A1c Waived 7.2 (H) <7.0 %  Specimen status report  Result Value Ref Range   specimen status report Comment   Comprehensive metabolic panel  Result Value Ref Range   Glucose 162 (H) 65 - 99 mg/dL   BUN 10 8 - 27 mg/dL   Creatinine, Ser 0.92 0.57 - 1.00 mg/dL   GFR calc non Af Amer 64 >59 mL/min/1.73   GFR calc Af Amer 74 >59 mL/min/1.73   BUN/Creatinine Ratio 11 11 - 26   Sodium 143 134 - 144 mmol/L   Potassium 4.0 3.5 - 5.2 mmol/L   Chloride 101 97 - 108  mmol/L   CO2 19 18 - 29 mmol/L   Calcium 9.2 8.7 - 10.3 mg/dL   Total Protein 6.4 6.0 - 8.5 g/dL   Albumin 4.3 3.6 - 4.8 g/dL   Globulin, Total 2.1 1.5 - 4.5 g/dL   Albumin/Globulin Ratio 2.0 1.1 - 2.5   Bilirubin Total <0.2 0.0 - 1.2 mg/dL   Alkaline Phosphatase 69 39 - 117 IU/L   AST 14 0 - 40 IU/L   ALT 14 0 - 32 IU/L      Assessment & Plan:   Problem List Items Addressed This Visit      Respiratory   Allergic rhinitis    Discuss patient with normal thyroid and no signs symptoms of problems from Trulicity Discuss allergy care and treatment use of OTC medications        Endocrine   Diabetes ([place])    Continue current care case samples Trulicity Patient has follow-up next month        Other Visit Diagnoses    Breast cancer screening    -  Primary    Relevant Orders    MM DIGITAL SCREENING BILATERAL        Follow up plan: Return for Has an appointment coming up with Malachy Mood for diabetes recheck.

## 2015-03-31 NOTE — Assessment & Plan Note (Signed)
Continue current care case samples Trulicity Patient has follow-up next month

## 2015-04-08 ENCOUNTER — Other Ambulatory Visit: Payer: Self-pay

## 2015-04-08 MED ORDER — SIMVASTATIN 40 MG PO TABS: 40.0000 mg | ORAL_TABLET | Freq: Every day | ORAL | Status: DC

## 2015-04-08 NOTE — Telephone Encounter (Signed)
LAST VISIT: 03/31/2015 PATIENT HAS AN UPCOMING APPT: 04/28/2015  Request for Simvastin 52m tab.

## 2015-04-28 ENCOUNTER — Encounter: Payer: Self-pay | Admitting: Family Medicine

## 2015-04-28 ENCOUNTER — Ambulatory Visit (INDEPENDENT_AMBULATORY_CARE_PROVIDER_SITE_OTHER): Payer: Medicare Other | Admitting: Family Medicine

## 2015-04-28 VITALS — BP 153/82 | HR 65 | Temp 97.7°F | Ht 66.5 in | Wt 168.0 lb

## 2015-04-28 DIAGNOSIS — I10 Essential (primary) hypertension: Secondary | ICD-10-CM

## 2015-04-28 DIAGNOSIS — N3000 Acute cystitis without hematuria: Secondary | ICD-10-CM

## 2015-04-28 DIAGNOSIS — E119 Type 2 diabetes mellitus without complications: Secondary | ICD-10-CM

## 2015-04-28 DIAGNOSIS — N390 Urinary tract infection, site not specified: Secondary | ICD-10-CM | POA: Insufficient documentation

## 2015-04-28 LAB — URINALYSIS, ROUTINE W REFLEX MICROSCOPIC
Bilirubin, UA: NEGATIVE
Glucose, UA: NEGATIVE
Ketones, UA: NEGATIVE
Nitrite, UA: NEGATIVE
PH UA: 5.5 (ref 5.0–7.5)
PROTEIN UA: NEGATIVE
RBC, UA: NEGATIVE
Specific Gravity, UA: <1.005 — ABNORMAL LOW (ref 1.005–1.030)
UUROB: 0.2 mg/dL (ref 0.2–1.0)

## 2015-04-28 LAB — MICROSCOPIC EXAMINATION

## 2015-04-28 LAB — BAYER DCA HB A1C WAIVED: HB A1C: 6 % (ref <7.0)

## 2015-04-28 MED ORDER — AMITRIPTYLINE HCL 25 MG PO TABS: 25.0000 mg | ORAL_TABLET | Freq: Every day | ORAL | Status: DC

## 2015-04-28 MED ORDER — CIPROFLOXACIN HCL 250 MG PO TABS: 250.0000 mg | ORAL_TABLET | Freq: Two times a day (BID) | ORAL | Status: DC

## 2015-04-28 NOTE — Assessment & Plan Note (Signed)
The current medical regimen is effective;  continue present plan and medications.  

## 2015-04-28 NOTE — Assessment & Plan Note (Signed)
Patient's blood pressures elevated today but will observe does well at home has been taken some over-the-counter cough and cold medications

## 2015-04-28 NOTE — Progress Notes (Signed)
BP 153/82 mmHg  Pulse 65  Temp(Src) 97.7 F (36.5 C)  Ht 5' 6.5" (1.689 m)  Wt 168 lb (76.204 kg)  BMI 26.71 kg/m2  SpO2 95%  LMP  (LMP Unknown)   Subjective:    Patient ID: Jamie Murray, female    DOB: 20-Oct-1945, 69 y.o.   MRN: [id number]  HPI: Jamie Murray is a 69 y.o. female  Chief Complaint  Patient presents with  . Diabetes  . Insomnia   diabetes doing well with no complaints from medications Elavil causes too much dry mouth once to try something else to help for when necessary sleep Has frequency urgency dysuria ongoing for over a week As upper respiratory tract infection which is on the wane after about 2 weeks  Relevant past medical, surgical, family and social history reviewed and updated as indicated. Interim medical history since our last visit reviewed. Allergies and medications reviewed and updated.  Review of Systems  Constitutional: Negative.   Respiratory: Negative.   Cardiovascular: Negative.     Per HPI unless specifically indicated above     Objective:    BP 153/82 mmHg  Pulse 65  Temp(Src) 97.7 F (36.5 C)  Ht 5' 6.5" (1.689 m)  Wt 168 lb (76.204 kg)  BMI 26.71 kg/m2  SpO2 95%  LMP  (LMP Unknown)  Wt Readings from Last 3 Encounters:  04/28/15 168 lb (76.204 kg)  03/31/15 173 lb (78.472 kg)  01/27/15 174 lb 12.8 oz (79.289 kg)    Physical Exam  Constitutional: She is oriented to person, place, and time. She appears well-developed and well-nourished. No distress.  HENT:  Head: Normocephalic and atraumatic.  Right Ear: Hearing normal.  Left Ear: Hearing normal.  Nose: Nose normal.  Eyes: Conjunctivae and lids are normal. Right eye exhibits no discharge. Left eye exhibits no discharge. No scleral icterus.  Cardiovascular: Normal rate, regular rhythm and normal heart sounds.   Pulmonary/Chest: Effort normal and breath sounds normal. No respiratory distress.  Musculoskeletal: Normal range of motion.  Neurological: She  is alert and oriented to person, place, and time.  Skin: Skin is intact. No rash noted.  Psychiatric: She has a normal mood and affect. Her speech is normal and behavior is normal. Judgment and thought content normal. Cognition and memory are normal.    Results for orders placed or performed in visit on 01/27/15  Microalbumin, Urine Waived  Result Value Ref Range   Microalb, Ur Waived 30 (H) 0 - 19 mg/L   Creatinine, Urine Waived 100 10 - 300 mg/dL   Microalb/Creat Ratio <30 <30 mg/g  Uric acid  Result Value Ref Range   Uric Acid 6.3 2.5 - 7.1 mg/dL  Bayer DCA Hb A1c Waived  Result Value Ref Range   Bayer DCA Hb A1c Waived 7.2 (H) <7.0 %  Specimen status report  Result Value Ref Range   specimen status report Comment   Comprehensive metabolic panel  Result Value Ref Range   Glucose 162 (H) 65 - 99 mg/dL   BUN 10 8 - 27 mg/dL   Creatinine, Ser 0.92 0.57 - 1.00 mg/dL   GFR calc non Af Amer 64 >59 mL/min/1.73   GFR calc Af Amer 74 >59 mL/min/1.73   BUN/Creatinine Ratio 11 11 - 26   Sodium 143 134 - 144 mmol/L   Potassium 4.0 3.5 - 5.2 mmol/L   Chloride 101 97 - 108 mmol/L   CO2 19 18 - 29 mmol/L   Calcium 9.2 8.7 -  10.3 mg/dL   Total Protein 6.4 6.0 - 8.5 g/dL   Albumin 4.3 3.6 - 4.8 g/dL   Globulin, Total 2.1 1.5 - 4.5 g/dL   Albumin/Globulin Ratio 2.0 1.1 - 2.5   Bilirubin Total <0.2 0.0 - 1.2 mg/dL   Alkaline Phosphatase 69 39 - 117 IU/L   AST 14 0 - 40 IU/L   ALT 14 0 - 32 IU/L      Assessment & Plan:   Problem List Items Addressed This Visit      Cardiovascular and Mediastinum   Hypertension    Patient's blood pressures elevated today but will observe does well at home has been taken some over-the-counter cough and cold medications        Endocrine   DM type 2 (diabetes mellitus, type 2) ([place]) - Primary    The current medical regimen is effective;  continue present plan and medications.       Relevant Orders   Bayer DCA Hb A1c Waived   Urinalysis,  Routine w reflex microscopic (not at [facility])     Genitourinary   Urinary tract infection    Discuss urinary tract infection care and treatment start Cipro          Follow up plan: Return in about 3 months (around 07/29/2015) for Physical Exam with Kathrine Haddock.

## 2015-04-28 NOTE — Assessment & Plan Note (Signed)
Discuss urinary tract infection care and treatment start Cipro

## 2015-05-15 ENCOUNTER — Other Ambulatory Visit: Payer: Self-pay

## 2015-05-15 MED ORDER — SITAGLIPTIN PHOSPHATE 100 MG PO TABS: 100.0000 mg | ORAL_TABLET | Freq: Every day | ORAL | Status: DC

## 2015-05-15 NOTE — Telephone Encounter (Signed)
Patient saw Dr. Jeananne Rama on 04/28/15 for diabetes but she is Jamie Murray's patient. Pharmacy is [facility].

## 2015-05-18 ENCOUNTER — Other Ambulatory Visit: Payer: Self-pay

## 2015-05-18 MED ORDER — ESCITALOPRAM OXALATE 20 MG PO TABS: 20.0000 mg | ORAL_TABLET | Freq: Every day | ORAL | Status: DC

## 2015-05-18 NOTE — Telephone Encounter (Signed)
Patient was last seen 04/28/15 and pharmacy is [facility].

## 2015-07-22 ENCOUNTER — Ambulatory Visit (INDEPENDENT_AMBULATORY_CARE_PROVIDER_SITE_OTHER): Payer: Medicare Other | Admitting: Unknown Physician Specialty

## 2015-07-22 ENCOUNTER — Encounter: Payer: Self-pay | Admitting: Unknown Physician Specialty

## 2015-07-22 VITALS — BP 140/82 | HR 83 | Temp 97.9°F | Ht 66.1 in | Wt 177.4 lb

## 2015-07-22 DIAGNOSIS — Z1239 Encounter for other screening for malignant neoplasm of breast: Secondary | ICD-10-CM | POA: Diagnosis not present

## 2015-07-22 DIAGNOSIS — F418 Other specified anxiety disorders: Secondary | ICD-10-CM

## 2015-07-22 DIAGNOSIS — K219 Gastro-esophageal reflux disease without esophagitis: Secondary | ICD-10-CM | POA: Diagnosis not present

## 2015-07-22 DIAGNOSIS — F419 Anxiety disorder, unspecified: Principal | ICD-10-CM

## 2015-07-22 DIAGNOSIS — E785 Hyperlipidemia, unspecified: Secondary | ICD-10-CM | POA: Diagnosis not present

## 2015-07-22 DIAGNOSIS — E1122 Type 2 diabetes mellitus with diabetic chronic kidney disease: Secondary | ICD-10-CM

## 2015-07-22 DIAGNOSIS — I10 Essential (primary) hypertension: Secondary | ICD-10-CM | POA: Diagnosis not present

## 2015-07-22 DIAGNOSIS — N182 Chronic kidney disease, stage 2 (mild): Secondary | ICD-10-CM

## 2015-07-22 DIAGNOSIS — Z0000 Encounter for general adult medical examination without abnormal findings: Secondary | ICD-10-CM

## 2015-07-22 DIAGNOSIS — F329 Major depressive disorder, single episode, unspecified: Secondary | ICD-10-CM

## 2015-07-22 LAB — BAYER DCA HB A1C WAIVED: HB A1C: 7.5 % — AB (ref <7.0)

## 2015-07-22 NOTE — Assessment & Plan Note (Signed)
Await lipid panel

## 2015-07-22 NOTE — Assessment & Plan Note (Signed)
Stable, continue present medications.   

## 2015-07-22 NOTE — Progress Notes (Signed)
BP 140/82 mmHg  Pulse 83  Temp(Src) 97.9 F (36.6 C)  Ht 5' 6.1" (1.679 m)  Wt 177 lb 6.4 oz (80.468 kg)  BMI 28.54 kg/m2  SpO2   LMP  (LMP Unknown)   Subjective:    Patient ID: Jamie Murray, female    DOB: 1946-01-30, 70 y.o.   MRN: [id number]  HPI: Jamie Murray is a 70 y.o. female  Chief Complaint  Patient presents with  . Medicare Wellness    requesting last eye exam report from pt's eye doctor  . Medication Refill    pt states she needs meclizine refilled  . Gastroesophageal Reflux    pt states she is having terrible heartburn at night   Functional Status Survey: Is the patient deaf or have difficulty hearing?: No Does the patient have difficulty seeing, even when wearing glasses/contacts?: No Does the patient have difficulty concentrating, remembering, or making decisions?: No Does the patient have difficulty walking or climbing stairs?: No Does the patient have difficulty dressing or bathing?: No Does the patient have difficulty doing errands alone such as visiting a doctor's office or shopping?: No  Fall Risk  07/22/2015 04/28/2015  Falls in the past year? No No    Pt is able to perform complex mental tasks, recognize clock face, recognize time and do a 3 item recall.      Diabetes:  Using medications without difficulties No hypoglycemic episodes No hyperglycemic episodes Feet problems: none Blood Sugars averaging: 135-140 eye exam within last year: No, needs to do that  Hypertension:  Using medications without difficulty Average home BPs "good at home"   Using medication without problems or lightheadedness No chest pain with exertion or shortness of breath No Edema  Elevated Cholesterol: Using medications without problems: No Muscle aches Diet compliance: poor Exercise: Stopped walking due to family stress.    GERD Cardiologist has her on Pantoprazole.  She eats at 6p but admits to an evening snack.    Depression Lots of  stress.   Depression screen [facility] 2/9 07/22/2015 04/28/2015  Decreased Interest 0 1  Down, Depressed, Hopeless 0 0  PHQ - 2 Score 0 1    Relevant past medical, surgical, family and social history reviewed and updated as indicated. Interim medical history since our last visit reviewed. Allergies and medications reviewed and updated.  Review of Systems  Constitutional: Negative.   HENT: Negative.   Eyes: Negative.   Respiratory: Negative.   Cardiovascular: Negative.   Gastrointestinal: Negative.   Endocrine: Negative.   Genitourinary: Negative.   Musculoskeletal: Negative.   Skin: Negative.   Allergic/Immunologic: Negative.   Neurological: Negative.   Hematological: Negative.   Psychiatric/Behavioral: Negative.     Per HPI unless specifically indicated above     Objective:    BP 140/82 mmHg  Pulse 83  Temp(Src) 97.9 F (36.6 C)  Ht 5' 6.1" (1.679 m)  Wt 177 lb 6.4 oz (80.468 kg)  BMI 28.54 kg/m2  SpO2   LMP  (LMP Unknown)  Wt Readings from Last 3 Encounters:  07/22/15 177 lb 6.4 oz (80.468 kg)  04/28/15 168 lb (76.204 kg)  03/31/15 173 lb (78.472 kg)    Physical Exam  Constitutional: She is oriented to person, place, and time. She appears well-developed and well-nourished.  HENT:  Head: Normocephalic and atraumatic.  Eyes: Pupils are equal, round, and reactive to light. Right eye exhibits no discharge. Left eye exhibits no discharge. No scleral icterus.  Neck: Normal range of motion.  Neck supple. Carotid bruit is not present. No thyromegaly present.  Cardiovascular: Normal rate, regular rhythm and normal heart sounds.  Exam reveals no gallop and no friction rub.   No murmur heard. Pulmonary/Chest: Effort normal and breath sounds normal. No respiratory distress. She has no wheezes. She has no rales.  Abdominal: Soft. Bowel sounds are normal. There is no tenderness. There is no rebound.  Genitourinary: No breast swelling, tenderness or discharge.  Musculoskeletal:  Normal range of motion.  Lymphadenopathy:    She has no cervical adenopathy.  Neurological: She is alert and oriented to person, place, and time.  Skin: Skin is warm, dry and intact. No rash noted.  Psychiatric: She has a normal mood and affect. Her speech is normal and behavior is normal. Judgment and thought content normal. Cognition and memory are normal.      Assessment & Plan:   Problem List Items Addressed This Visit      Unprioritized   Anxiety and depression - Primary   Hypertension    Stable, continue present medications.        Relevant Orders   Comprehensive metabolic panel   DM type 2 (diabetes mellitus, type 2) (HCC)    Hgb A1C is 7.5.  She will work on her diet and exercise rather than change medications      Relevant Orders   CBC with Differential/Platelet   Bayer DCA Hb A1c Waived   Comprehensive metabolic panel   Hyperlipidemia    Await lipid panel      Relevant Orders   Lipid Panel w/o Chol/HDL Ratio   Gastroesophageal reflux disease without esophagitis    Refer to GI with no help from Protonix.  Eliminate evening snack      Relevant Orders   Ambulatory referral to Gastroenterology    Other Visit Diagnoses    Routine general medical examination at a health care facility        Relevant Orders    Hepatitis C antibody    Breast cancer screening            Follow up plan: Return in about 3 months (around 10/19/2015).

## 2015-07-22 NOTE — Assessment & Plan Note (Signed)
Hgb A1C is 7.5.  She will work on her diet and exercise rather than change medications

## 2015-07-22 NOTE — Patient Instructions (Signed)
Please do call to schedule your mammogram; the number to schedule one at either [facility] or [facility] is [phone]

## 2015-07-22 NOTE — Assessment & Plan Note (Addendum)
Refer to GI with no help from Protonix.  Eliminate evening snack

## 2015-07-23 LAB — CBC WITH DIFFERENTIAL/PLATELET
BASOS ABS: 0 10*3/uL (ref 0.0–0.2)
BASOS: 0 %
EOS (ABSOLUTE): 0.1 10*3/uL (ref 0.0–0.4)
Eos: 2 %
Hematocrit: 32.4 % — ABNORMAL LOW (ref 34.0–46.6)
Hemoglobin: 10.3 g/dL — ABNORMAL LOW (ref 11.1–15.9)
IMMATURE GRANS (ABS): 0 10*3/uL (ref 0.0–0.1)
IMMATURE GRANULOCYTES: 0 %
LYMPHS: 30 %
Lymphocytes Absolute: 1.6 10*3/uL (ref 0.7–3.1)
MCH: 27 pg (ref 26.6–33.0)
MCHC: 31.8 g/dL (ref 31.5–35.7)
MCV: 85 fL (ref 79–97)
MONOS ABS: 0.3 10*3/uL (ref 0.1–0.9)
Monocytes: 5 %
NEUTROS PCT: 63 %
Neutrophils Absolute: 3.3 10*3/uL (ref 1.4–7.0)
PLATELETS: 250 10*3/uL (ref 150–379)
RBC: 3.81 x10E6/uL (ref 3.77–5.28)
RDW: 15 % (ref 12.3–15.4)
WBC: 5.3 10*3/uL (ref 3.4–10.8)

## 2015-07-23 LAB — COMPREHENSIVE METABOLIC PANEL
ALK PHOS: 68 IU/L (ref 39–117)
ALT: 19 IU/L (ref 0–32)
AST: 14 IU/L (ref 0–40)
Albumin/Globulin Ratio: 1.7 (ref 1.1–2.5)
Albumin: 4.1 g/dL (ref 3.6–4.8)
BUN/Creatinine Ratio: 13 (ref 11–26)
BUN: 11 mg/dL (ref 8–27)
Bilirubin Total: 0.2 mg/dL (ref 0.0–1.2)
CO2: 24 mmol/L (ref 18–29)
Calcium: 9.5 mg/dL (ref 8.7–10.3)
Chloride: 98 mmol/L (ref 96–106)
Creatinine, Ser: 0.82 mg/dL (ref 0.57–1.00)
GFR calc Af Amer: 85 mL/min/{1.73_m2} (ref >59)
GFR calc non Af Amer: 74 mL/min/{1.73_m2} (ref >59)
Globulin, Total: 2.4 g/dL (ref 1.5–4.5)
Glucose: 147 mg/dL — ABNORMAL HIGH (ref 65–99)
Potassium: 3.9 mmol/L (ref 3.5–5.2)
SODIUM: 138 mmol/L (ref 134–144)
Total Protein: 6.5 g/dL (ref 6.0–8.5)

## 2015-07-23 LAB — LIPID PANEL W/O CHOL/HDL RATIO
Cholesterol, Total: 139 mg/dL (ref 100–199)
HDL: 45 mg/dL (ref >39)
LDL Calculated: 63 mg/dL (ref 0–99)
Triglycerides: 156 mg/dL — ABNORMAL HIGH (ref 0–149)
VLDL Cholesterol Cal: 31 mg/dL (ref 5–40)

## 2015-07-23 LAB — HEPATITIS C ANTIBODY: Hep C Virus Ab: <0.1 s/co ratio (ref 0.0–0.9)

## 2015-07-28 ENCOUNTER — Encounter: Payer: Self-pay | Admitting: Unknown Physician Specialty

## 2015-07-31 ENCOUNTER — Telehealth: Payer: Self-pay

## 2015-07-31 NOTE — Telephone Encounter (Signed)
Patient notified. I asked for her to give [place] a call if she has any questions about the letter once she gets it.

## 2015-07-31 NOTE — Telephone Encounter (Signed)
Patient called and stated she was returning Cheryl's call about labs. She states that if she does not answer that you can leave a voicemail.

## 2015-07-31 NOTE — Telephone Encounter (Signed)
Please let her know I sent her a letter about her labs

## 2015-08-13 ENCOUNTER — Encounter: Payer: Self-pay | Admitting: Gastroenterology

## 2015-08-13 ENCOUNTER — Ambulatory Visit: Payer: Medicare Other | Admitting: Gastroenterology

## 2015-08-13 ENCOUNTER — Ambulatory Visit (INDEPENDENT_AMBULATORY_CARE_PROVIDER_SITE_OTHER): Payer: Medicare Other | Admitting: Gastroenterology

## 2015-08-13 VITALS — BP 127/75 | HR 83 | Ht 66.5 in | Wt 177.0 lb

## 2015-08-13 DIAGNOSIS — K219 Gastro-esophageal reflux disease without esophagitis: Secondary | ICD-10-CM

## 2015-08-14 ENCOUNTER — Other Ambulatory Visit: Payer: Self-pay

## 2015-08-15 NOTE — Progress Notes (Signed)
Gastroenterology Consultation  Referring Provider:     Kathrine Haddock, NP Primary Care Physician:  Kathrine Haddock, NP Primary Gastroenterologist:  Dr. Allen Norris     Reason for Consultation:     Heartburn         HPI:   Jamie Murray is a 70 y.o. y/o female referred for consultation & management of Heartburn by Dr. Kathrine Haddock, NP.    This patient comes in today with a history of heartburn. The patient reports that she takes Protonix for her heartburn.  She also tells me that she has acid breakthrough at night. She sometimes has to get up multiple times and night to take comes but usually treats herself with Pepto-Bismol  For her heartburn.  There is no report of any unexplained weight loss, fevers, chills, nausea or vomiting. She also reports that she takes the Protonix in the morning.  The heartburn usually occurs every single day.  She denies any dysphagia with food or liquid being unable to go down.  Past Medical History  Diagnosis Date  . Diabetes ([place])   . Anxiety   . Osteoporosis   . Anemia   . Hypertension   . Hyperlipidemia   . Insomnia   . Depression   . [facility] spotted fever   . Allergy   . Transient cerebral ischemia   . Migraines     Past Surgical History  Procedure Laterality Date  . Abdominal hysterectomy    . Tubal ligation    . Coronary angioplasty      Prior to Admission medications   Medication Sig Start Date End Date Taking? Authorizing Provider  amitriptyline (ELAVIL) 25 MG tablet Take 1 tablet (25 mg total) by mouth at bedtime. PRN sleep 04/28/15  Yes Guadalupe Maple, MD  aspirin 81 MG tablet Take 81 mg by mouth daily.   Yes Historical Provider, MD  clopidogrel (PLAVIX) 75 MG tablet Take 75 mg by mouth daily.   Yes Historical Provider, MD  Dulaglutide (TRULICITY) 1.5 DJ/2.4QA SOPN Inject 1.5 mg into the skin once a week. 02/06/15  Yes Kathrine Haddock, NP  escitalopram (LEXAPRO) 20 MG tablet Take 1 tablet (20 mg total) by mouth daily. 05/18/15   Yes Kathrine Haddock, NP  irbesartan-hydrochlorothiazide (AVALIDE) 150-12.5 MG per tablet Take 2 tablets by mouth daily. 01/26/15  Yes Kathrine Haddock, NP  metFORMIN (GLUCOPHAGE-XR) 500 MG 24 hr tablet Take 1 tablet (500 mg total) by mouth 4 (four) times daily. 02/02/15  Yes Kathrine Haddock, NP  metoprolol succinate (TOPROL-XL) 25 MG 24 hr tablet Take 12.5 mg by mouth daily.  04/15/14  Yes Historical Provider, MD  Multiple Vitamin (MULTIVITAMIN) tablet Take 1 tablet by mouth daily.   Yes Historical Provider, MD  pantoprazole (PROTONIX) 40 MG tablet Take 40 mg by mouth daily.   Yes Historical Provider, MD  simvastatin (ZOCOR) 40 MG tablet Take 1 tablet (40 mg total) by mouth daily at 6 PM. 04/08/15  Yes Guadalupe Maple, MD  sitaGLIPtin (JANUVIA) 100 MG tablet Take 1 tablet (100 mg total) by mouth daily. 05/15/15  Yes Kathrine Haddock, NP  vitamin B-12 (CYANOCOBALAMIN) 1000 MCG tablet Take 1,000 mcg by mouth daily.   Yes Historical Provider, MD  meclizine (ANTIVERT) 25 MG tablet Take 25 mg by mouth every 6 (six) hours as needed. Reported on 08/13/2015 04/22/14   Historical Provider, MD    Family History  Problem Relation Age of Onset  . Diabetes Mother   . Hypertension Mother   .  Stroke Mother   . Heart disease Mother     MI  . Cancer Father     pancreatic  . Stroke Father   . Hypertension Sister   . Diabetes Brother   . Diabetes Maternal Grandfather   . Diabetes Sister   . Diabetes Sister   . Diabetes Brother   . Diabetes Brother      Social History  Substance Use Topics  . Smoking status: Never Smoker   . Smokeless tobacco: Never Used  . Alcohol Use: No    Allergies as of 08/13/2015 - Review Complete 08/13/2015  Allergen Reaction Noted  . Prozac [fluoxetine hcl] Other (See Comments) 01/26/2015    Review of Systems:    All systems reviewed and negative except where noted in HPI.   Physical Exam:  BP 127/75 mmHg  Pulse 83  Ht 5' 6.5" (1.689 m)  Wt 177 lb (80.287 kg)  BMI 28.14 kg/m2   LMP  (LMP Unknown) No LMP recorded (lmp unknown). Patient has had a hysterectomy. Psych:  Alert and cooperative. Normal mood and affect. General:   Alert,  Well-developed, well-nourished, pleasant and cooperative in NAD Head:  Normocephalic and atraumatic. Eyes:  Sclera clear, no icterus.   Conjunctiva pink. Ears:  Normal auditory acuity. Nose:  No deformity, discharge, or lesions. Mouth:  No deformity or lesions,oropharynx pink & moist. Neck:  Supple; no masses or thyromegaly. Lungs:  Respirations even and unlabored.  Clear throughout to auscultation.   No wheezes, crackles, or rhonchi. No acute distress. Heart:  Regular rate and rhythm; no murmurs, clicks, rubs, or gallops. Abdomen:  Normal bowel sounds.  No bruits.  Soft, non-tender and non-distended without masses, hepatosplenomegaly or hernias noted.  No guarding or rebound tenderness.  Negative Carnett sign.   Rectal:  Deferred.  Msk:  Symmetrical without gross deformities.  Good, equal movement & strength bilaterally. Pulses:  Normal pulses noted. Extremities:  No clubbing or edema.  No cyanosis. Neurologic:  Alert and oriented x3;  grossly normal neurologically. Skin:  Intact without significant lesions or rashes.  No jaundice. Lymph Nodes:  No significant cervical adenopathy. Psych:  Alert and cooperative. Normal mood and affect.  Imaging Studies: No results found.  Assessment and Plan:   Jamie Murray is a 70 y.o. y/o female  Who comes in today with a history of acid reflux not be completely resolved by  Pantoprazole. The patient reports that most of her acid breakthrough is at night when she's laying down and it wakes her up for a sleep. The patient has been told that she should take her Protonix in the evening for better nocturnal acid suppression. She will also be set up for an upper endoscopy due to the long-standing heartburn.I have discussed risks & benefits which include, but are not limited to, bleeding,  infection, perforation & drug reaction.  The patient agrees with this plan & written consent will be obtained.      Note: This dictation was prepared with Dragon dictation along with smaller phrase technology. Any transcriptional errors that result from this process are unintentional.

## 2015-08-17 ENCOUNTER — Encounter: Payer: Self-pay | Admitting: *Deleted

## 2015-08-20 NOTE — Discharge Instructions (Signed)
General Anesthesia, Adult, Care After Refer to this sheet in the next few weeks. These instructions provide you with information on caring for yourself after your procedure. Your health care provider may also give you more specific instructions. Your treatment has been planned according to current medical practices, but problems sometimes occur. Call your health care provider if you have any problems or questions after your procedure. WHAT TO EXPECT AFTER THE PROCEDURE After the procedure, it is typical to experience:  Sleepiness.  Nausea and vomiting. HOME CARE INSTRUCTIONS  For the first 24 hours after general anesthesia:  Have a responsible person with you.  Do not drive a car. If you are alone, do not take public transportation.  Do not drink alcohol.  Do not take medicine that has not been prescribed by your health care provider.  Do not sign important papers or make important decisions.  You may resume a normal diet and activities as directed by your health care provider.  Change bandages (dressings) as directed.  If you have questions or problems that seem related to general anesthesia, call the hospital and ask for the anesthetist or anesthesiologist on call. SEEK MEDICAL CARE IF:  You have nausea and vomiting that continue the day after anesthesia.  You develop a rash. SEEK IMMEDIATE MEDICAL CARE IF:   You have difficulty breathing.  You have chest pain.  You have any allergic problems.   This information is not intended to replace advice given to you by your health care provider. Make sure you discuss any questions you have with your health care provider.   Document Released: 08/29/2000 Document Revised: 06/13/2014 Document Reviewed: 09/21/2011 Elsevier Interactive Patient Education [employer].

## 2015-08-24 ENCOUNTER — Other Ambulatory Visit: Payer: Self-pay

## 2015-08-24 ENCOUNTER — Ambulatory Visit: Payer: Medicare Other | Admitting: Anesthesiology

## 2015-08-24 ENCOUNTER — Ambulatory Visit
Admission: RE | Admit: 2015-08-24 | Discharge: 2015-08-24 | Disposition: A | Discharge status: Home / Self Care | Payer: Medicare Other | Source: Ambulatory Visit | Attending: Gastroenterology | Admitting: Gastroenterology

## 2015-08-24 ENCOUNTER — Encounter
Admission: RE | Disposition: A | Discharge status: Home / Self Care | Payer: Self-pay | Source: Ambulatory Visit | Attending: Gastroenterology

## 2015-08-24 ENCOUNTER — Encounter: Payer: Self-pay | Admitting: *Deleted

## 2015-08-24 DIAGNOSIS — G43909 Migraine, unspecified, not intractable, without status migrainosus: Secondary | ICD-10-CM | POA: Insufficient documentation

## 2015-08-24 DIAGNOSIS — Z79899 Other long term (current) drug therapy: Secondary | ICD-10-CM | POA: Insufficient documentation

## 2015-08-24 DIAGNOSIS — R12 Heartburn: Secondary | ICD-10-CM | POA: Diagnosis not present

## 2015-08-24 DIAGNOSIS — E119 Type 2 diabetes mellitus without complications: Secondary | ICD-10-CM | POA: Diagnosis not present

## 2015-08-24 DIAGNOSIS — F329 Major depressive disorder, single episode, unspecified: Secondary | ICD-10-CM | POA: Diagnosis not present

## 2015-08-24 DIAGNOSIS — Z7984 Long term (current) use of oral hypoglycemic drugs: Secondary | ICD-10-CM | POA: Diagnosis not present

## 2015-08-24 DIAGNOSIS — K449 Diaphragmatic hernia without obstruction or gangrene: Secondary | ICD-10-CM | POA: Diagnosis not present

## 2015-08-24 DIAGNOSIS — K219 Gastro-esophageal reflux disease without esophagitis: Secondary | ICD-10-CM | POA: Diagnosis not present

## 2015-08-24 DIAGNOSIS — E785 Hyperlipidemia, unspecified: Secondary | ICD-10-CM | POA: Diagnosis not present

## 2015-08-24 DIAGNOSIS — F419 Anxiety disorder, unspecified: Secondary | ICD-10-CM | POA: Diagnosis not present

## 2015-08-24 DIAGNOSIS — K222 Esophageal obstruction: Secondary | ICD-10-CM | POA: Insufficient documentation

## 2015-08-24 DIAGNOSIS — Z7982 Long term (current) use of aspirin: Secondary | ICD-10-CM | POA: Diagnosis not present

## 2015-08-24 DIAGNOSIS — E039 Hypothyroidism, unspecified: Secondary | ICD-10-CM | POA: Diagnosis not present

## 2015-08-24 DIAGNOSIS — I10 Essential (primary) hypertension: Secondary | ICD-10-CM | POA: Diagnosis not present

## 2015-08-24 DIAGNOSIS — M810 Age-related osteoporosis without current pathological fracture: Secondary | ICD-10-CM | POA: Diagnosis not present

## 2015-08-24 DIAGNOSIS — Z90710 Acquired absence of both cervix and uterus: Secondary | ICD-10-CM | POA: Insufficient documentation

## 2015-08-24 HISTORY — PX: ESOPHAGOGASTRODUODENOSCOPY (EGD) WITH PROPOFOL: SHX5813

## 2015-08-24 HISTORY — DX: Gastro-esophageal reflux disease without esophagitis: K21.9

## 2015-08-24 HISTORY — DX: Dizziness and giddiness: R42

## 2015-08-24 LAB — GLUCOSE, CAPILLARY
Glucose-Capillary: 119 mg/dL — ABNORMAL HIGH (ref 65–99)
Glucose-Capillary: 109 mg/dL — ABNORMAL HIGH (ref 65–99)

## 2015-08-24 SURGERY — ESOPHAGOGASTRODUODENOSCOPY (EGD) WITH PROPOFOL
Anesthesia: Monitor Anesthesia Care | Wound class: Clean Contaminated

## 2015-08-24 MED ORDER — AMITRIPTYLINE HCL 25 MG PO TABS: 25.0000 mg | ORAL_TABLET | Freq: Every day | ORAL | Status: DC

## 2015-08-24 MED ORDER — SIMVASTATIN 40 MG PO TABS: 40.0000 mg | ORAL_TABLET | Freq: Every day | ORAL | Status: DC

## 2015-08-24 MED ORDER — PROPOFOL 10 MG/ML IV BOLUS
INTRAVENOUS | Status: DC | PRN
  Administered 2015-08-24: 20 mg via INTRAVENOUS
  Administered 2015-08-24: 140 mg via INTRAVENOUS
  Administered 2015-08-24: 50 mg via INTRAVENOUS

## 2015-08-24 MED ORDER — PANTOPRAZOLE SODIUM 40 MG PO TBEC: 40.0000 mg | DELAYED_RELEASE_TABLET | Freq: Two times a day (BID) | ORAL | Status: DC

## 2015-08-24 MED ORDER — SIMETHICONE 40 MG/0.6ML PO SUSP
ORAL | Status: DC | PRN
  Administered 2015-08-24: 08:00:00

## 2015-08-24 MED ORDER — GLYCOPYRROLATE 0.2 MG/ML IJ SOLN
INTRAMUSCULAR | Status: DC | PRN
  Administered 2015-08-24: 0.1 mg via INTRAVENOUS

## 2015-08-24 MED ORDER — ACETAMINOPHEN 160 MG/5ML PO SOLN: 325.0000 mg | ORAL | Status: DC | PRN

## 2015-08-24 MED ORDER — LACTATED RINGERS IV SOLN
INTRAVENOUS | Status: DC
  Administered 2015-08-24: 07:00:00 via INTRAVENOUS

## 2015-08-24 MED ORDER — LIDOCAINE HCL (CARDIAC) 20 MG/ML IV SOLN
INTRAVENOUS | Status: DC | PRN
  Administered 2015-08-24: 50 mg via INTRAVENOUS

## 2015-08-24 MED ORDER — ACETAMINOPHEN 325 MG PO TABS: 325.0000 mg | ORAL_TABLET | ORAL | Status: DC | PRN

## 2015-08-24 SURGICAL SUPPLY — 31 items
BALLN DILATOR 10-12 8 (BALLOONS)
BALLN DILATOR 12-15 8 (BALLOONS) ×3
BALLN DILATOR 15-18 8 (BALLOONS)
BALLN DILATOR CRE 0-12 8 (BALLOONS)
BALLN DILATOR ESOPH 8 10 CRE (MISCELLANEOUS) IMPLANT
BALLOON DILATOR 12-15 8 (BALLOONS) ×1 IMPLANT
BALLOON DILATOR 15-18 8 (BALLOONS) IMPLANT
BALLOON DILATOR CRE 0-12 8 (BALLOONS) IMPLANT
BLOCK BITE 60FR ADLT L/F GRN (MISCELLANEOUS) ×3 IMPLANT
CANISTER SUCT 1200ML W/VALVE (MISCELLANEOUS) ×3 IMPLANT
CLIP HMST 235XBRD CATH ROT (MISCELLANEOUS) IMPLANT
CLIP RESOLUTION 360 11X235 (MISCELLANEOUS)
FCP ESCP3.2XJMB 240X2.8X (MISCELLANEOUS)
FORCEPS BIOP RAD 4 LRG CAP 4 (CUTTING FORCEPS) IMPLANT
FORCEPS BIOP RJ4 240 W/NDL (MISCELLANEOUS)
FORCEPS ESCP3.2XJMB 240X2.8X (MISCELLANEOUS) IMPLANT
GOWN CVR UNV OPN BCK APRN NK (MISCELLANEOUS) ×2 IMPLANT
GOWN ISOL THUMB LOOP REG UNIV (MISCELLANEOUS) ×4
INJECTOR VARIJECT VIN23 (MISCELLANEOUS) IMPLANT
KIT DEFENDO VALVE AND CONN (KITS) IMPLANT
KIT ENDO PROCEDURE OLY (KITS) ×3 IMPLANT
MARKER SPOT ENDO TATTOO 5ML (MISCELLANEOUS) IMPLANT
PAD GROUND ADULT SPLIT (MISCELLANEOUS) IMPLANT
SNARE SHORT THROW 13M SML OVAL (MISCELLANEOUS) IMPLANT
SNARE SHORT THROW 30M LRG OVAL (MISCELLANEOUS) IMPLANT
SPOT EX ENDOSCOPIC TATTOO (MISCELLANEOUS)
SYR INFLATION 60ML (SYRINGE) ×3 IMPLANT
VARIJECT INJECTOR VIN23 (MISCELLANEOUS)
WATER STERILE IRR 250ML POUR (IV SOLUTION) ×3 IMPLANT
WIDE-EYE POLYPTRAP (MISCELLANEOUS) IMPLANT
WIRE CRE 18-20MM 8CM F G (MISCELLANEOUS) IMPLANT

## 2015-08-24 NOTE — H&P (Signed)
[facility]  [address] [place], [place] [address] Phone: [phone] Fax : [phone]  Primary Care Physician:  Kathrine Haddock, NP Primary Gastroenterologist:  Dr. Allen Norris  Pre-Procedure History & Physical: HPI:  Jamie Murray is a 70 y.o. female is here for an endoscopy.   Past Medical History  Diagnosis Date  . Diabetes ([place])   . Anxiety   . Osteoporosis   . Anemia   . Hypertension   . Hyperlipidemia   . Insomnia   . Depression   . [facility] spotted fever   . Allergy   . Transient cerebral ischemia   . GERD (gastroesophageal reflux disease)   . Migraines   . Vertigo     every 2-3 months    Past Surgical History  Procedure Laterality Date  . Abdominal hysterectomy    . Tubal ligation    . Coronary angioplasty      Prior to Admission medications   Medication Sig Start Date End Date Taking? Authorizing Provider  amitriptyline (ELAVIL) 25 MG tablet Take 1 tablet (25 mg total) by mouth at bedtime. PRN sleep 04/28/15  Yes Guadalupe Maple, MD  aspirin 81 MG tablet Take 81 mg by mouth daily.   Yes Historical Provider, MD  clopidogrel (PLAVIX) 75 MG tablet Take 75 mg by mouth daily.   Yes Historical Provider, MD  Dulaglutide (TRULICITY) 1.5 CX/4.4YJ SOPN Inject 1.5 mg into the skin once a week. 02/06/15  Yes Kathrine Haddock, NP  escitalopram (LEXAPRO) 20 MG tablet Take 1 tablet (20 mg total) by mouth daily. 05/18/15  Yes Kathrine Haddock, NP  irbesartan-hydrochlorothiazide (AVALIDE) 150-12.5 MG per tablet Take 2 tablets by mouth daily. 01/26/15  Yes Kathrine Haddock, NP  meclizine (ANTIVERT) 25 MG tablet Take 25 mg by mouth every 6 (six) hours as needed. Reported on 08/13/2015 04/22/14  Yes Historical Provider, MD  metFORMIN (GLUCOPHAGE-XR) 500 MG 24 hr tablet Take 1 tablet (500 mg total) by mouth 4 (four) times daily. 02/02/15  Yes Kathrine Haddock, NP  metoprolol succinate (TOPROL-XL) 25 MG 24 hr tablet Take 12.5 mg by mouth daily.  04/15/14  Yes  Historical Provider, MD  Multiple Vitamin (MULTIVITAMIN) tablet Take 1 tablet by mouth daily.   Yes Historical Provider, MD  pantoprazole (PROTONIX) 40 MG tablet Take 40 mg by mouth daily.   Yes Historical Provider, MD  simvastatin (ZOCOR) 40 MG tablet Take 1 tablet (40 mg total) by mouth daily at 6 PM. 04/08/15  Yes Guadalupe Maple, MD  sitaGLIPtin (JANUVIA) 100 MG tablet Take 1 tablet (100 mg total) by mouth daily. 05/15/15  Yes Kathrine Haddock, NP  vitamin B-12 (CYANOCOBALAMIN) 1000 MCG tablet Take 1,000 mcg by mouth daily.   Yes Historical Provider, MD    Allergies as of 08/14/2015 - Review Complete 08/13/2015  Allergen Reaction Noted  . Prozac [fluoxetine hcl] Other (See Comments) 01/26/2015    Family History  Problem Relation Age of Onset  . Diabetes Mother   . Hypertension Mother   . Stroke Mother   . Heart disease Mother     MI  . Cancer Father     pancreatic  . Stroke Father   . Hypertension Sister   . Diabetes Brother   . Diabetes Maternal Grandfather   . Diabetes Sister   . Diabetes Sister   . Diabetes Brother   . Diabetes Brother     Social History   Social History  . Marital Status: Married    Spouse Name:  N/A  . Number of Children: N/A  . Years of Education: N/A   Occupational History  . Not on file.   Social History Main Topics  . Smoking status: Never Smoker   . Smokeless tobacco: Never Used  . Alcohol Use: No  . Drug Use: No  . Sexual Activity: No   Other Topics Concern  . Not on file   Social History Narrative    Review of Systems: See HPI, otherwise negative ROS  Physical Exam: BP 149/85 mmHg  Pulse 99  Temp(Src) 97.5 F (36.4 C)  Resp 16  Ht 5' 6.5" (1.689 m)  Wt 177 lb (80.287 kg)  BMI 28.14 kg/m2  SpO2 99%  LMP  (LMP Unknown) General:   Alert,  pleasant and cooperative in NAD Head:  Normocephalic and atraumatic. Neck:  Supple; no masses or thyromegaly. Lungs:  Clear throughout to auscultation.    Heart:  Regular rate and  rhythm. Abdomen:  Soft, nontender and nondistended. Normal bowel sounds, without guarding, and without rebound.   Neurologic:  Alert and  oriented x4;  grossly normal neurologically.  Impression/Plan: Jamie Murray is here for an endoscopy to be performed for GERD  Risks, benefits, limitations, and alternatives regarding  endoscopy have been reviewed with the patient.  Questions have been answered.  All parties agreeable.   Ollen Bowl, MD  08/24/2015, 7:45 AM

## 2015-08-24 NOTE — Anesthesia Preprocedure Evaluation (Signed)
Anesthesia Evaluation  Patient identified by MRN, date of birth, ID band Patient awake    Reviewed: Allergy & Precautions, H&P , NPO status , Patient's Chart, lab work & pertinent test results, reviewed documented beta blocker date and time   Airway Mallampati: II  TM Distance: >3 FB Neck ROM: full    Dental no notable dental hx.    Pulmonary neg pulmonary ROS,    Pulmonary exam normal breath sounds clear to auscultation       Cardiovascular Exercise Tolerance: Good hypertension, Normal cardiovascular exam Rhythm:regular Rate:Normal     Neuro/Psych PSYCHIATRIC DISORDERS negative neurological ROS     GI/Hepatic Neg liver ROS, GERD  Poorly Controlled,  Endo/Other  diabetes, Type 2Hypothyroidism   Renal/GU negative Renal ROS  negative genitourinary   Musculoskeletal   Abdominal   Peds  Hematology negative hematology ROS (+)   Anesthesia Other Findings   Reproductive/Obstetrics negative OB ROS                             Anesthesia Physical Anesthesia Plan  ASA: II  Anesthesia Plan: MAC   Post-op Pain Management:    Induction: Intravenous  Airway Management Planned: Nasal Cannula  Additional Equipment:   Intra-op Plan:   Post-operative Plan:   Informed Consent: I have reviewed the patients History and Physical, chart, labs and discussed the procedure including the risks, benefits and alternatives for the proposed anesthesia with the patient or authorized representative who has indicated his/her understanding and acceptance.   Dental Advisory Given  Plan Discussed with: CRNA  Anesthesia Plan Comments:         Anesthesia Quick Evaluation

## 2015-08-24 NOTE — Op Note (Signed)
[facility] Patient Name: Jamie Murray Procedure Date: 08/24/2015 7:40 AM MRN: [id number] Account #: [id number] Date of Birth: [birth year] Admit Type: Outpatient Age: 70 Room: [facility] OR ROOM 01 Gender: Female Note Status: Finalized Procedure:            Upper GI endoscopy Indications:          Heartburn Providers:            Lucilla Lame, MD Referring MD:         Kathrine Haddock, PA (Referring MD) Medicines:            Propofol per Anesthesia Complications:        No immediate complications. Procedure:            Pre-Anesthesia Assessment:                       - Prior to the procedure, a History and Physical was                        performed, and patient medications and allergies were                        reviewed. The patient's tolerance of previous                        anesthesia was also reviewed. The risks and benefits of                        the procedure and the sedation options and risks were                        discussed with the patient. All questions were                        answered, and informed consent was obtained. Prior                        Anticoagulants: The patient has taken no previous                        anticoagulant or antiplatelet agents. ASA Grade                        Assessment: II - A patient with mild systemic disease.                        After reviewing the risks and benefits, the patient was                        deemed in satisfactory condition to undergo the                        procedure.                       After obtaining informed consent, the endoscope was                        passed under direct vision. Throughout the procedure,  the patient's blood pressure, pulse, and oxygen                        saturations were monitored continuously. The Olympus                        GIF H180J endoscope (S#: [id number]) was introduced                        through the mouth,  and advanced to the second part of                        duodenum. The upper GI endoscopy was accomplished                        without difficulty. The patient tolerated the procedure                        well. Findings:      A small hiatal hernia was present.      One mild benign-appearing, intrinsic stenosis was found at the       gastroesophageal junction. And was traversed. A TTS dilator was passed       through the scope. Dilation with a 15-16.5-18 mm balloon dilator was       performed to 18 mm.      A large amount of food (residue) was found in the entire examined       stomach.      The examined duodenum was normal. Impression:           - Small hiatal hernia.                       - Benign-appearing esophageal stenosis. Dilated.                       - A large amount of food (residue) in the stomach.                       - Normal examined duodenum.                       - No specimens collected. Recommendation:       - Use Protonix (pantoprazole) 40 mg PO BID. Procedure Code(s):    --- Professional ---                       [phone], Esophagogastroduodenoscopy, flexible, transoral;                        with transendoscopic balloon dilation of esophagus                        (less than 30 mm diameter) Diagnosis Code(s):    --- Professional ---                       R12, Heartburn                       K44.9, Diaphragmatic hernia without obstruction or  gangrene                       K22.2, Esophageal obstruction CPT copyright 2016 American Medical Association. All rights reserved. The codes documented in this report are preliminary and upon coder review may  be revised to meet current compliance requirements. Lucilla Lame, MD 08/24/2015 8:10:30 AM This report has been signed electronically. Number of Addenda: 0 Note Initiated On: 08/24/2015 7:40 AM      [facility]

## 2015-08-24 NOTE — Anesthesia Postprocedure Evaluation (Signed)
Anesthesia Post Note  Patient: Jamie Murray  Procedure(s) Performed: Procedure(s) (LRB): ESOPHAGOGASTRODUODENOSCOPY (EGD) WITH PROPOFOL with dialation (N/A)  Patient location during evaluation: PACU Anesthesia Type: MAC Level of consciousness: awake and alert Pain management: pain level controlled Vital Signs Assessment: post-procedure vital signs reviewed and stable Respiratory status: spontaneous breathing, nonlabored ventilation and respiratory function stable Cardiovascular status: stable and blood pressure returned to baseline Anesthetic complications: no    Trecia Rogers

## 2015-08-24 NOTE — Telephone Encounter (Signed)
Patient was last seen 07/22/15 and has appointment 10/21/15. Pharmacy is [facility].

## 2015-08-24 NOTE — Transfer of Care (Signed)
Immediate Anesthesia Transfer of Care Note  Patient: Jamie Murray  Procedure(s) Performed: Procedure(s) with comments: ESOPHAGOGASTRODUODENOSCOPY (EGD) WITH PROPOFOL with dialation (N/A) - Diabetic - oral meds  Patient Location: PACU  Anesthesia Type: MAC  Level of Consciousness: awake, alert  and patient cooperative  Airway and Oxygen Therapy: Patient Spontanous Breathing and Patient connected to supplemental oxygen  Post-op Assessment: Post-op Vital signs reviewed, Patient's Cardiovascular Status Stable, Respiratory Function Stable, Patent Airway and No signs of Nausea or vomiting  Post-op Vital Signs: Reviewed and stable  Complications: No apparent anesthesia complications

## 2015-08-24 NOTE — Anesthesia Procedure Notes (Signed)
Procedure Name: MAC Performed by: Cameron Ali Pre-anesthesia Checklist: Patient identified, Emergency Drugs available, Suction available, Timeout performed and Patient being monitored Patient Re-evaluated:Patient Re-evaluated prior to inductionOxygen Delivery Method: Nasal cannula Placement Confirmation: positive ETCO2

## 2015-08-25 ENCOUNTER — Encounter: Payer: Self-pay | Admitting: Gastroenterology

## 2015-09-07 ENCOUNTER — Other Ambulatory Visit: Payer: Self-pay

## 2015-09-07 MED ORDER — IRBESARTAN-HYDROCHLOROTHIAZIDE 150-12.5 MG PO TABS: 2.0000 | ORAL_TABLET | Freq: Every day | ORAL | Status: DC

## 2015-09-07 NOTE — Telephone Encounter (Signed)
Patient was last seen 07/22/15 for physical. Pharmacy is [facility].

## 2015-09-21 ENCOUNTER — Telehealth: Payer: Self-pay

## 2015-09-21 ENCOUNTER — Other Ambulatory Visit: Payer: Self-pay

## 2015-09-21 DIAGNOSIS — R42 Dizziness and giddiness: Secondary | ICD-10-CM

## 2015-09-21 MED ORDER — MECLIZINE HCL 25 MG PO TABS: 25.0000 mg | ORAL_TABLET | Freq: Four times a day (QID) | ORAL | Status: DC | PRN

## 2015-09-21 NOTE — Telephone Encounter (Signed)
Patient's daughter, Tillie Rung , called and stated that the patient has been very anxious, no concentration here lately, and has been having "head spells". Daughter states that she knows her mother takes meclizine for dizziness. Patient was seen by neurology, Dr. Melrose Nakayama, last May and daughter states that he found a spot on the patient's brain. Patient's daughter stated that the patient is aware that she called [place] today and the family would like a referral for the patient to go back to neurology to see if the spot that Dr. Melrose Nakayama found was getting worse or if they could find out why the patient is experiencing these symptoms. Patient's daughter stated that they prefer not to go back to Dr. Melrose Nakayama, but she would like her mother to be re-evaluated by neurology.

## 2015-09-21 NOTE — Telephone Encounter (Signed)
Routing to provider.

## 2015-09-21 NOTE — Telephone Encounter (Signed)
Called and let patient's daughter know that referral was put in for [facility]. I asked for her to please give [place] a call if they have not heard anything within a week.

## 2015-09-21 NOTE — Telephone Encounter (Signed)
Will do.  We probably need to refer to [facility] neurological

## 2015-10-01 ENCOUNTER — Ambulatory Visit: Payer: BC Managed Care – PPO | Admitting: Neurology

## 2015-10-02 ENCOUNTER — Other Ambulatory Visit: Payer: Self-pay

## 2015-10-02 ENCOUNTER — Ambulatory Visit: Payer: BC Managed Care – PPO | Admitting: Diagnostic Neuroimaging

## 2015-10-02 MED ORDER — CLOPIDOGREL BISULFATE 75 MG PO TABS: 75.0000 mg | ORAL_TABLET | Freq: Every day | ORAL | Status: DC

## 2015-10-02 NOTE — Telephone Encounter (Signed)
Patient was seen 07/22/15 and has appointment 10/21/15. Pharmacy is [facility].

## 2015-10-20 ENCOUNTER — Ambulatory Visit (INDEPENDENT_AMBULATORY_CARE_PROVIDER_SITE_OTHER): Payer: Medicare Other | Admitting: Diagnostic Neuroimaging

## 2015-10-20 ENCOUNTER — Encounter: Payer: Self-pay | Admitting: Diagnostic Neuroimaging

## 2015-10-20 VITALS — Ht 67.0 in | Wt 179.8 lb

## 2015-10-20 DIAGNOSIS — G450 Vertebro-basilar artery syndrome: Secondary | ICD-10-CM

## 2015-10-20 DIAGNOSIS — R4781 Slurred speech: Secondary | ICD-10-CM | POA: Diagnosis not present

## 2015-10-20 DIAGNOSIS — R42 Dizziness and giddiness: Secondary | ICD-10-CM | POA: Diagnosis not present

## 2015-10-20 DIAGNOSIS — R269 Unspecified abnormalities of gait and mobility: Secondary | ICD-10-CM

## 2015-10-20 NOTE — Progress Notes (Signed)
[facility]  PATIENT: Jamie Murray DOB: November 29, 1945  REFERRING CLINICIAN: Katheren Puller, NP HISTORY FROM: patient and daughter Tillie Rung) REASON FOR VISIT: new consult   HISTORICAL  CHIEF COMPLAINT:  Chief Complaint  Patient presents with  . Dizziness    rm 6, New Pt, dgtr- Kendra, "falls x 2, vertigo/dizzness episodes increasing"    HISTORY OF PRESENT ILLNESS:   70 year old right-handed female with history of depression, anxiety, diabetes, here for evaluation of dizziness and balance difficulty. Patient has had intermittent "spells" for past 5-6 years, every 2 weeks or every 2 months, lasting 1-3 days. She feels a foggy, cloudy, so he headed sensation or head. No spinning sensation nausea or vomiting. No passing out. She has had several falls. Sometimes she has slurred speech, memory loss and anxiety.  Over the past 2 months patient has had increasing problems with balance and walking difficulty. Patient was diagnosed with possible vertigo problems as well as possible silent migraines. She has been treated with meclizine which seems to relieve her symptoms partially. She was also prescribed nortriptyline which did not help.  No specific triggering or aggravating factors.   REVIEW OF SYSTEMS: Full 14 system review of systems performed and negative with exception of: Depression anxiety not asleep decreased energy disinterest in activities allergies slurred speech dizziness sleepiness feeling hot cough fatigue.  ALLERGIES: Allergies  Allergen Reactions  . Prozac [Fluoxetine Hcl] Other (See Comments)    "felt crazy"    HOME MEDICATIONS: Outpatient Prescriptions Prior to Visit  Medication Sig Dispense Refill  . amitriptyline (ELAVIL) 25 MG tablet Take 1 tablet (25 mg total) by mouth at bedtime. PRN sleep 30 tablet 3  . aspirin 81 MG tablet Take 81 mg by mouth daily.    . clopidogrel (PLAVIX) 75 MG tablet Take 1 tablet (75 mg total) by mouth daily. 90  tablet 3  . Dulaglutide (TRULICITY) 1.5 HY/8.6VH SOPN Inject 1.5 mg into the skin once a week. 12 pen 3  . escitalopram (LEXAPRO) 20 MG tablet Take 1 tablet (20 mg total) by mouth daily. 90 tablet 1  . irbesartan-hydrochlorothiazide (AVALIDE) 150-12.5 MG tablet Take 2 tablets by mouth daily. 180 tablet 1  . meclizine (ANTIVERT) 25 MG tablet Take 1 tablet (25 mg total) by mouth every 6 (six) hours as needed. Reported on 08/13/2015 30 tablet 1  . metFORMIN (GLUCOPHAGE-XR) 500 MG 24 hr tablet Take 1 tablet (500 mg total) by mouth 4 (four) times daily. 360 tablet 3  . metoprolol succinate (TOPROL-XL) 25 MG 24 hr tablet Take 12.5 mg by mouth daily.     . Multiple Vitamin (MULTIVITAMIN) tablet Take 1 tablet by mouth daily.    . pantoprazole (PROTONIX) 40 MG tablet Take 1 tablet (40 mg total) by mouth 2 (two) times daily. 60 tablet 11  . simvastatin (ZOCOR) 40 MG tablet Take 1 tablet (40 mg total) by mouth daily at 6 PM. 30 tablet 3  . sitaGLIPtin (JANUVIA) 100 MG tablet Take 1 tablet (100 mg total) by mouth daily. 90 tablet 3  . vitamin B-12 (CYANOCOBALAMIN) 1000 MCG tablet Take 1,000 mcg by mouth daily.    . pantoprazole (PROTONIX) 40 MG tablet Take 40 mg by mouth daily.     No facility-administered medications prior to visit.    PAST MEDICAL HISTORY: Past Medical History  Diagnosis Date  . Diabetes ([place])   . Anxiety   . Osteoporosis   . Anemia   . Hypertension   . Hyperlipidemia   .  Insomnia   . Depression   . [facility] spotted fever   . Allergy   . Transient cerebral ischemia   . GERD (gastroesophageal reflux disease)   . Migraines   . Vertigo     every 2-3 months    PAST SURGICAL HISTORY: Past Surgical History  Procedure Laterality Date  . Abdominal hysterectomy    . Tubal ligation    . Coronary angioplasty    . Esophagogastroduodenoscopy (egd) with propofol N/A 08/24/2015    Procedure: ESOPHAGOGASTRODUODENOSCOPY (EGD) WITH PROPOFOL with dialation;  Surgeon: Lucilla Lame,  MD;  Location: [place];  Service: Endoscopy;  Laterality: N/A;  Diabetic - oral meds    FAMILY HISTORY: Family History  Problem Relation Age of Onset  . Diabetes Mother   . Hypertension Mother   . Stroke Mother   . Heart disease Mother     MI  . Cancer Father     pancreatic  . Stroke Father   . Hypertension Sister   . Diabetes Brother   . Diabetes Maternal Grandfather   . Diabetes Sister   . Diabetes Sister   . Diabetes Brother   . Diabetes Brother     SOCIAL HISTORY:  Social History   Social History  . Marital Status: Married    Spouse Name: [name]  . Number of Children: 3  . Years of Education: 14   Occupational History  .      work part time, day care   Social History Main Topics  . Smoking status: Never Smoker   . Smokeless tobacco: Never Used  . Alcohol Use: No  . Drug Use: No  . Sexual Activity: No   Other Topics Concern  . Not on file   Social History Narrative   Lives with husband   Caffeine use- coffee 2 cups daily, Coke once a week, green tea daily     PHYSICAL EXAM  GENERAL EXAM/CONSTITUTIONAL: Vitals:  Filed Vitals:   10/20/15 0840  Height: _0  (1.702 m)  Weight: 179 lb 12.8 oz (81.557 kg)    Orthostatic Blood Pressure (10/20/15) Blood pressure: lying 154/89, sitting 149/85, standing 144/79 Pulse:  lying 73, sitting 71, standing 75   Body mass index is 28.15 kg/(m^2).  Visual Acuity Screening   Right eye Left eye Both eyes  Without correction: 20/40 20/30   With correction:        Patient is in no distress; well developed, nourished and groomed; neck is supple  CARDIOVASCULAR:  Examination of carotid arteries is normal; no carotid bruits  Regular rate and rhythm, no murmurs  Examination of peripheral vascular system by observation and palpation is normal  EYES:  Ophthalmoscopic exam of optic discs and posterior segments is normal; no papilledema or hemorrhages  MUSCULOSKELETAL:  Gait, strength, tone,  movements noted in Neurologic exam below  NEUROLOGIC: MENTAL STATUS:  No flowsheet data found.  awake, alert, oriented to person, place and time  recent and remote memory intact  normal attention and concentration  language fluent, comprehension intact, naming intact,   fund of knowledge appropriate  CRANIAL NERVE:   2nd - no papilledema on fundoscopic exam  2nd, 3rd, 4th, 6th - pupils equal and reactive to light, visual fields full to confrontation, extraocular muscles intact, no nystagmus  5th - facial sensation symmetric  7th - facial strength symmetric  8th - hearing intact  9th - palate elevates symmetrically, uvula midline  11th - shoulder shrug symmetric  12th - tongue protrusion midline  MOTOR:   normal bulk and tone, full strength in the BUE, BLE  SENSORY:   normal and symmetric to light touch, pinprick, temperature, vibration; DECR PP AND VIB AT TOES  COORDINATION:   finger-nose-finger, fine finger movements normal  REFLEXES:   deep tendon reflexes present and symmetric; TRACE AT ANKLES  GAIT/STATION:   narrow based gait; romberg is negative    DIAGNOSTIC DATA (LABS, IMAGING, TESTING) - I reviewed patient records, labs, notes, testing and imaging myself where available.  Lab Results  Component Value Date   WBC 5.3 07/22/2015   HGB 11.5* 04/10/2013   HCT 32.4* 07/22/2015   MCV 85 07/22/2015   PLT 250 07/22/2015      Component Value Date/Time   NA 138 07/22/2015 0843   NA 144 04/17/2012 1056   K 3.9 07/22/2015 0843   K 3.8 04/17/2012 1056   CL 98 07/22/2015 0843   CL 107 04/17/2012 1056   CO2 24 07/22/2015 0843   CO2 29 04/17/2012 1056   GLUCOSE 147* 07/22/2015 0843   GLUCOSE 105* 04/17/2012 1056   BUN 11 07/22/2015 0843   BUN 12 04/17/2012 1056   CREATININE 0.82 07/22/2015 0843   CREATININE 0.87 09/25/2014 1028   CALCIUM 9.5 07/22/2015 0843   CALCIUM 8.8 04/17/2012 1056   PROT 6.5 07/22/2015 0843   PROT 7.2 04/17/2012 1056    ALBUMIN 4.1 07/22/2015 0843   ALBUMIN 3.7 04/17/2012 1056   AST 14 07/22/2015 0843   AST 24 04/17/2012 1056   ALT 19 07/22/2015 0843   ALT 19 04/17/2012 1056   ALKPHOS 68 07/22/2015 0843   ALKPHOS 58 04/17/2012 1056   BILITOT 0.2 07/22/2015 0843   BILITOT 0.3 04/17/2012 1056   GFRNONAA 74 07/22/2015 0843   GFRNONAA >60 09/25/2014 1028   GFRAA 85 07/22/2015 0843   GFRAA >60 09/25/2014 1028   Lab Results  Component Value Date   CHOL 139 07/22/2015   HDL 45 07/22/2015   LDLCALC 63 07/22/2015   TRIG 156* 07/22/2015   Lab Results  Component Value Date   HGBA1C 6.5* 04/18/2012   No results found for: VITAMINB12 No results found for: TSH  02/10/12 EKG [I reviewed images myself and agree with interpretation. -VRP]  - normal sinus rhythm; LVH, cannot rule out septal infarct.  04/17/12 carotid u/s  - There is no evidence of a hemodynamically significant carotid stenosis.   09/25/14 MRI brain [I reviewed images myself and agree with interpretation. -VRP]  - Question small acute infarct right midbrain versus artifact. - Otherwise negative. - Sinusitis.    ASSESSMENT AND PLAN  70 y.o. year old female here with 5-6 years intermittent spells of cloudy headed sensation, foggy headed sensation, dizziness, slurred speech, confusion, balance difficulty.  Ddx: TIA, migraine phenomenon, metabolic, hypoglycemia, hypotension, diabetic neuropathy (+/- autonomic neuropathy)   1. Vertebrobasilar TIAs   2. Gait difficulty   3. Slurred speech   4. Dizziness and giddiness      PLAN: - I will check MRI brain, MRA head and neck - continue aspirin, plavix, BP, diabetes and cholesterol medications - monitor blood pressure and blood sugar at home; monitor spells at home and work - consider PT for balance difficulty  Orders Placed This Encounter  Procedures  . MR Brain W Wo Contrast  . MR MRA HEAD WO CONTRAST  . MR Angiogram Neck W Wo Contrast   Return in about 6 weeks (around  12/01/2015).  I reviewed images, labs, notes, records myself. I summarized findings  and reviewed with patient, for this high risk condition (possible TIA) requiring high complexity decision making.    Penni Bombard, MD 5/54/7689, 15:52 AM Certified in Neurology, Neurophysiology and Neuroimaging  [facility] [address] [place], [place] [address] [phone]

## 2015-10-20 NOTE — Patient Instructions (Signed)
Thank you for coming to see [place] at [facility]. I hope we have been able to provide you high quality care today.  You may receive a patient satisfaction survey over the next few weeks. We would appreciate your feedback and comments so that we may continue to improve ourselves and the health of our patients.  - I will check MRI brain, MRA head and neck - continue aspirin, plavix, BP, diabetes and cholesterol medications - monitor blood pressure and blood sugar at home; monitor spells at home and work   ~~~~~~~~~~~~~~~~~~~~~~~~~~~~~~~~~~~~~~~~~~~~~~~~~~~~~~~~~~~~~~~~~  DR. Brandin Dilday'S GUIDE TO HAPPY AND HEALTHY LIVING These are some of my general health and wellness recommendations. Some of them may apply to you better than others. Please use common sense as you try these suggestions and feel free to ask me any questions.   ACTIVITY/FITNESS Mental, social, emotional and physical stimulation are very important for brain and body health. Try learning a new activity (arts, music, language, sports, games).  Keep moving your body to the best of your abilities. You can do this at home, inside or outside, the park, community center, gym or anywhere you like. Consider a physical therapist or personal trainer to get started. Consider the app Sworkit. Fitness trackers such as smart-watches, smart-phones or Fitbits can help as well.   NUTRITION Eat more plants: colorful vegetables, nuts, seeds and berries.  Eat less sugar, salt, preservatives and processed foods.  Avoid toxins such as cigarettes and alcohol.  Drink water when you are thirsty. Warm water with a slice of lemon is an excellent morning drink to start the day.  Consider these websites for more information The Nutrition Source (https://www.henry-hernandez.biz/) Precision Nutrition (WindowBlog.ch)   RELAXATION Consider practicing mindfulness meditation or other relaxation  techniques such as deep breathing, prayer, yoga, tai chi, massage. See website mindful.org or the apps Headspace or Calm to help get started.   SLEEP Try to get at least 7-8+ hours sleep per day. Regular exercise and reduced caffeine will help you sleep better. Practice good sleep hygeine techniques. See website sleep.org for more information.   PLANNING Prepare estate planning, living will, healthcare POA documents. Sometimes this is best planned with the help of an attorney. Theconversationproject.org and agingwithdignity.org are excellent resources.

## 2015-10-21 ENCOUNTER — Encounter: Payer: Self-pay | Admitting: Unknown Physician Specialty

## 2015-10-21 ENCOUNTER — Ambulatory Visit (INDEPENDENT_AMBULATORY_CARE_PROVIDER_SITE_OTHER): Payer: Medicare Other | Admitting: Unknown Physician Specialty

## 2015-10-21 VITALS — BP 151/85 | HR 82 | Temp 97.3°F | Ht 66.3 in | Wt 179.2 lb

## 2015-10-21 DIAGNOSIS — I10 Essential (primary) hypertension: Secondary | ICD-10-CM

## 2015-10-21 DIAGNOSIS — N182 Chronic kidney disease, stage 2 (mild): Secondary | ICD-10-CM

## 2015-10-21 DIAGNOSIS — E1122 Type 2 diabetes mellitus with diabetic chronic kidney disease: Secondary | ICD-10-CM | POA: Diagnosis not present

## 2015-10-21 LAB — BAYER DCA HB A1C WAIVED: HB A1C (BAYER DCA - WAIVED): 7.7 % — ABNORMAL HIGH (ref <7.0)

## 2015-10-21 NOTE — Assessment & Plan Note (Signed)
Hgb A1C is 7.7.  She would like to not start any new medications at this time.  Will work on diet and exercise and recheck in 3 months

## 2015-10-21 NOTE — Progress Notes (Signed)
BP 151/85 mmHg  Pulse 82  Temp(Src) 97.3 F (36.3 C)  Ht 5' 6.3" (1.684 m)  Wt 179 lb 3.2 oz (81.285 kg)  BMI 28.66 kg/m2  SpO2   LMP  (LMP Unknown)   Subjective:    Patient ID: Jamie Murray, female    DOB: 07/09/1945, 70 y.o.   MRN: [id number]  HPI: Jamie Murray is a 70 y.o. female  Chief Complaint  Patient presents with  . Diabetes    pt states last eye exam that we have on file is correct  . Hyperlipidemia  . Hypertension   Diabetes: Using medications without difficulties No hypoglycemic episodes No hyperglycemic episodes Feet problems: none Blood Sugars averaging: "a little high lately" eye exam within last year Last Hgb A1C: 7.5  Hypertension  Using medications without difficulty Average home BPs "always good"   Using medication without problems or lightheadedness No chest pain with exertion or shortness of breath No Edema  Elevated Cholesterol Using medications without problems No Muscle aches  Diet/exercise: Sister in the hospital and not eating right or walking   Relevant past medical, surgical, family and social history reviewed and updated as indicated. Interim medical history since our last visit reviewed. Allergies and medications reviewed and updated.  Review of Systems  Constitutional: Negative.   HENT: Negative.   Eyes: Negative.   Respiratory: Negative.   Cardiovascular: Negative.   Gastrointestinal: Negative.   Endocrine: Negative.   Genitourinary: Negative.   Musculoskeletal: Negative.   Skin: Negative.        Some bruising at Trulicity injection sites  Allergic/Immunologic: Negative.   Neurological: Negative.   Hematological: Negative.   Psychiatric/Behavioral: Negative.     Per HPI unless specifically indicated above     Objective:    BP 151/85 mmHg  Pulse 82  Temp(Src) 97.3 F (36.3 C)  Ht 5' 6.3" (1.684 m)  Wt 179 lb 3.2 oz (81.285 kg)  BMI 28.66 kg/m2  SpO2   LMP  (LMP Unknown)  Wt Readings  from Last 3 Encounters:  10/21/15 179 lb 3.2 oz (81.285 kg)  10/20/15 179 lb 12.8 oz (81.557 kg)  08/24/15 177 lb (80.287 kg)    Physical Exam  Constitutional: She is oriented to person, place, and time. She appears well-developed and well-nourished. No distress.  HENT:  Head: Normocephalic and atraumatic.  Eyes: Conjunctivae and lids are normal. Right eye exhibits no discharge. Left eye exhibits no discharge. No scleral icterus.  Neck: Normal range of motion. Neck supple. No JVD present. Carotid bruit is not present.  Cardiovascular: Normal rate, regular rhythm and normal heart sounds.   Pulmonary/Chest: Effort normal and breath sounds normal.  Abdominal: Normal appearance. There is no splenomegaly or hepatomegaly.  Musculoskeletal: Normal range of motion.  Neurological: She is alert and oriented to person, place, and time.  Skin: Skin is warm, dry and intact. No rash noted. No pallor.  Psychiatric: She has a normal mood and affect. Her behavior is normal. Judgment and thought content normal.      Assessment & Plan:   Problem List Items Addressed This Visit      Unprioritized   DM type 2 (diabetes mellitus, type 2) ([place]) - Primary    Hgb A1C is 7.7.  She would like to not start any new medications at this time.  Will work on diet and exercise and recheck in 3 months      Relevant Orders   Bayer DCA Hb A1c Waived   Comprehensive  metabolic panel   Essential (primary) hypertension    Not to goal.  We will see if diet and exercise helps.  Better numbers at home          Follow up plan: Return in about 3 months (around 01/21/2016).

## 2015-10-21 NOTE — Assessment & Plan Note (Addendum)
Not to goal.  We will see if diet and exercise helps.  Better numbers at home

## 2015-10-22 LAB — COMPREHENSIVE METABOLIC PANEL
ALBUMIN: 3.8 g/dL (ref 3.6–4.8)
ALT: 19 IU/L (ref 0–32)
AST: 16 IU/L (ref 0–40)
Albumin/Globulin Ratio: 1.7 (ref 1.2–2.2)
Alkaline Phosphatase: 64 IU/L (ref 39–117)
BUN / CREAT RATIO: 12 (ref 12–28)
BUN: 11 mg/dL (ref 8–27)
Bilirubin Total: 0.2 mg/dL (ref 0.0–1.2)
CO2: 25 mmol/L (ref 18–29)
CREATININE: 0.91 mg/dL (ref 0.57–1.00)
Calcium: 9 mg/dL (ref 8.7–10.3)
Chloride: 99 mmol/L (ref 96–106)
GFR calc non Af Amer: 65 mL/min/{1.73_m2} (ref >59)
GFR, EST AFRICAN AMERICAN: 74 mL/min/{1.73_m2} (ref >59)
GLUCOSE: 119 mg/dL — AB (ref 65–99)
Globulin, Total: 2.2 g/dL (ref 1.5–4.5)
Potassium: 3.8 mmol/L (ref 3.5–5.2)
Sodium: 141 mmol/L (ref 134–144)
TOTAL PROTEIN: 6 g/dL (ref 6.0–8.5)

## 2015-11-03 ENCOUNTER — Telehealth: Payer: Self-pay | Admitting: Unknown Physician Specialty

## 2015-11-03 NOTE — Telephone Encounter (Signed)
Patient called stating that her BP was running 162/82 this morning and want Malachy Mood to know that its been like that for the last couple days and wants to talk to her about it.

## 2015-11-03 NOTE — Telephone Encounter (Signed)
Called pt and discussed her BP.  She states it is high when she gets dizzy.  When she is not, it is lower than 120/80.  I feel the dizzyness is causing increased BP at the time.  She is seeing neurology for her dizzyness.  She is awaiting a complete w/u

## 2015-11-03 NOTE — Telephone Encounter (Signed)
Routing to provider.  

## 2015-11-04 LAB — HM DIABETES EYE EXAM

## 2015-11-14 ENCOUNTER — Ambulatory Visit
Admission: EM | Admit: 2015-11-14 | Discharge: 2015-11-14 | Disposition: A | Discharge status: Home / Self Care | Payer: Medicare Other | Attending: Family Medicine | Admitting: Family Medicine

## 2015-11-14 DIAGNOSIS — F4329 Adjustment disorder with other symptoms: Secondary | ICD-10-CM

## 2015-11-14 DIAGNOSIS — I10 Essential (primary) hypertension: Secondary | ICD-10-CM

## 2015-11-14 LAB — GLUCOSE, CAPILLARY: GLUCOSE-CAPILLARY: 125 mg/dL — AB (ref 65–99)

## 2015-11-14 MED ORDER — CLONIDINE HCL 0.1 MG PO TABS
0.1000 mg | ORAL_TABLET | ORAL | Status: AC
  Administered 2015-11-14: 0.1 mg via ORAL

## 2015-11-14 NOTE — ED Provider Notes (Signed)
CSN: [id number]     Arrival date & time 11/14/15  1246 History   None    Chief Complaint  Patient presents with  . Headache    Pt awoke today feeling lightheaded and headache. Checked B/P and found to be high. Took one of her blood pressure medications but no improvement.   (Consider location/radiation/quality/duration/timing/severity/associated sxs/prior Treatment) HPI Comments: 70 yo female with a h/o hypertension, diabetes presents with a c/o high blood pressure since this morning. States has been under a lot of stress with family situation and has been having headaches. Denies any chest pains, shortness of breath, numbness/tingling, one-sided weakness, vision changes.   The history is provided by the patient.    Past Medical History  Diagnosis Date  . Diabetes ([place])   . Anxiety   . Osteoporosis   . Anemia   . Hypertension   . Hyperlipidemia   . Insomnia   . Depression   . [facility] spotted fever   . Allergy   . Transient cerebral ischemia   . GERD (gastroesophageal reflux disease)   . Migraines   . Vertigo     every 2-3 months   Past Surgical History  Procedure Laterality Date  . Abdominal hysterectomy    . Tubal ligation    . Coronary angioplasty    . Esophagogastroduodenoscopy (egd) with propofol N/A 08/24/2015    Procedure: ESOPHAGOGASTRODUODENOSCOPY (EGD) WITH PROPOFOL with dialation;  Surgeon: Lucilla Lame, MD;  Location: [place];  Service: Endoscopy;  Laterality: N/A;  Diabetic - oral meds   Family History  Problem Relation Age of Onset  . Diabetes Mother   . Hypertension Mother   . Stroke Mother   . Heart disease Mother     MI  . Cancer Father     pancreatic  . Stroke Father   . Hypertension Sister   . Diabetes Brother   . Diabetes Maternal Grandfather   . Diabetes Sister   . Diabetes Sister   . Diabetes Brother   . Diabetes Brother    Social History  Substance Use Topics  . Smoking status: Never Smoker   . Smokeless tobacco:  Never Used  . Alcohol Use: No   OB History    No data available     Review of Systems  Allergies  Prozac  Home Medications   Prior to Admission medications   Medication Sig Start Date End Date Taking? Authorizing Provider  amitriptyline (ELAVIL) 25 MG tablet Take 1 tablet (25 mg total) by mouth at bedtime. PRN sleep 08/24/15  Yes Kathrine Haddock, NP  aspirin 81 MG tablet Take 81 mg by mouth daily.   Yes Historical Provider, MD  clopidogrel (PLAVIX) 75 MG tablet Take 1 tablet (75 mg total) by mouth daily. 10/02/15  Yes Kathrine Haddock, NP  Dulaglutide (TRULICITY) 1.5 VO/5.3GU SOPN Inject 1.5 mg into the skin once a week. 02/06/15  Yes Kathrine Haddock, NP  escitalopram (LEXAPRO) 20 MG tablet Take 1 tablet (20 mg total) by mouth daily. 05/18/15  Yes Kathrine Haddock, NP  Flaxseed, Linseed, (FLAX SEED OIL PO) Take by mouth 2 (two) times daily.   Yes Historical Provider, MD  irbesartan-hydrochlorothiazide (AVALIDE) 150-12.5 MG tablet Take 2 tablets by mouth daily. 09/07/15  Yes Kathrine Haddock, NP  meclizine (ANTIVERT) 25 MG tablet Take 1 tablet (25 mg total) by mouth every 6 (six) hours as needed. Reported on 08/13/2015 09/21/15  Yes Kathrine Haddock, NP  metFORMIN (GLUCOPHAGE-XR) 500 MG 24 hr tablet Take 1 tablet (  500 mg total) by mouth 4 (four) times daily. 02/02/15  Yes Kathrine Haddock, NP  metoprolol succinate (TOPROL-XL) 25 MG 24 hr tablet Take 12.5 mg by mouth daily.  04/15/14  Yes Historical Provider, MD  Multiple Vitamin (MULTIVITAMIN) tablet Take 1 tablet by mouth daily.   Yes Historical Provider, MD  pantoprazole (PROTONIX) 40 MG tablet Take 1 tablet (40 mg total) by mouth 2 (two) times daily. 08/24/15  Yes Lucilla Lame, MD  simvastatin (ZOCOR) 40 MG tablet Take 1 tablet (40 mg total) by mouth daily at 6 PM. 08/24/15  Yes Kathrine Haddock, NP  sitaGLIPtin (JANUVIA) 100 MG tablet Take 1 tablet (100 mg total) by mouth daily. 05/15/15  Yes Kathrine Haddock, NP   Meds Ordered and Administered this Visit   Medications   cloNIDine (CATAPRES) tablet 0.1 mg (0.1 mg Oral Given 11/14/15 1312)    BP 150/90 mmHg  Pulse 83  Temp(Src) 98.5 F (36.9 C) (Oral)  Resp 18  Ht _0  (1.702 m)  Wt 180 lb (81.647 kg)  BMI 28.19 kg/m2  SpO2 99%  LMP  (LMP Unknown) No data found.   Physical Exam  Constitutional: She appears well-developed and well-nourished. No distress.  Neck: Neck supple.  Cardiovascular: Normal rate, regular rhythm and normal heart sounds.   Pulmonary/Chest: Effort normal and breath sounds normal. No respiratory distress. She has no wheezes. She has no rales.  Neurological: She is alert.  Skin: She is not diaphoretic.  Nursing note and vitals reviewed.   ED Course  Procedures (including critical care time)  Labs Review Labs Reviewed  GLUCOSE, CAPILLARY - Abnormal; Notable for the following:    Glucose-Capillary 125 (*)    All other components within normal limits  CBG MONITORING, ED    Imaging Review No results found.   Visual Acuity Review  Right Eye Distance:   Left Eye Distance:   Bilateral Distance:    Right Eye Near:   Left Eye Near:    Bilateral Near:         MDM   1. Essential hypertension   2. Stress and adjustment reaction    Discharge Medication List as of 11/14/2015  2:40 PM     1. diagnosis reviewed with patient 2. Patient given clonidine 0.55m po x 1 with improvement of blood pressure 3. Recommend supportive treatment with stress/anxiety reduction behavioral techniques; continue home blood pressure medication 4. Follow-up prn if symptoms worsen or don't improve    ONorval Gable MD 11/17/15 1435

## 2015-11-16 ENCOUNTER — Telehealth: Payer: Self-pay | Admitting: Unknown Physician Specialty

## 2015-11-16 NOTE — Telephone Encounter (Signed)
Routing to provider.  

## 2015-11-16 NOTE — Telephone Encounter (Signed)
Pt scheduled but she would like a call back from PCP to discuss what she should do until her appt.

## 2015-11-16 NOTE — Telephone Encounter (Signed)
Wednesday at 1:30.

## 2015-11-16 NOTE — Telephone Encounter (Signed)
Called and spoke to patient. I gave her Cheryl's instructions and patient questioned that because she is already taking 2 tablets of irbesartan/hctz. So I went and asked Malachy Mood and Malachy Mood asked for her to double her metoprolol instead. So I gave the patient these instructions. Will route back to provider so she can sign off on this.

## 2015-11-16 NOTE — Telephone Encounter (Signed)
Thank you

## 2015-11-16 NOTE — Telephone Encounter (Signed)
Pt called and stated that her BP is 182/104 and she would like to know if there was any way we could get her worked in.

## 2015-11-16 NOTE — Telephone Encounter (Signed)
I have only 15 patients tomorrow.  I should be able to work her in.  Ask her to double her Irbesartin/HCTZ until then

## 2015-11-18 ENCOUNTER — Encounter: Payer: Self-pay | Admitting: Unknown Physician Specialty

## 2015-11-18 ENCOUNTER — Ambulatory Visit (INDEPENDENT_AMBULATORY_CARE_PROVIDER_SITE_OTHER): Payer: Medicare Other | Admitting: Unknown Physician Specialty

## 2015-11-18 VITALS — BP 144/79 | HR 111 | Ht 66.0 in | Wt 178.4 lb

## 2015-11-18 DIAGNOSIS — R000 Tachycardia, unspecified: Secondary | ICD-10-CM

## 2015-11-18 DIAGNOSIS — R5382 Chronic fatigue, unspecified: Secondary | ICD-10-CM | POA: Diagnosis not present

## 2015-11-18 DIAGNOSIS — G4700 Insomnia, unspecified: Secondary | ICD-10-CM

## 2015-11-18 DIAGNOSIS — I10 Essential (primary) hypertension: Secondary | ICD-10-CM | POA: Diagnosis not present

## 2015-11-18 MED ORDER — VALSARTAN-HYDROCHLOROTHIAZIDE 320-25 MG PO TABS: 1.0000 | ORAL_TABLET | Freq: Every day | ORAL | Status: DC

## 2015-11-18 NOTE — Assessment & Plan Note (Signed)
Discussed use of Magnesium and Melatonin

## 2015-11-18 NOTE — Progress Notes (Signed)
BP 144/79 mmHg  Pulse 111  Ht 5' 6" (1.676 m)  Wt 178 lb 6.4 oz (80.922 kg)  BMI 28.81 kg/m2  SpO2   LMP  (LMP Unknown)   Subjective:    Patient ID: Jamie Murray, female    DOB: May 04, 1946, 70 y.o.   MRN: [id number]  HPI: Jamie Murray is a 70 y.o. female  Chief Complaint  Patient presents with  . Hypertension  . Labs Only    pt states she would like thyroid, cholesterol, and vitamin d checked   Hypertension Went to urgent care for high BP.  Was given Clonidine and it was decided her BP elevation was due to stress and anxiety.  She does not feel this.  Her increased the Irbesartan  2 daily and her daughter wants me to change to Valsartan.   After a phone call, we increased the Metoprolol to 25 mg as was only taking 12.5 mg.   No chest pain with exertion or shortness of breath No Edema  Fatigue Tired all the time.  Hard to move for "point a to b."  Stress test done in the last 6 weeks  Insomnia Family wants to know if she can take Melatonin.     Relevant past medical, surgical, family and social history reviewed and updated as indicated. Interim medical history since our last visit reviewed. Allergies and medications reviewed and updated.  Review of Systems  Per HPI unless specifically indicated above     Objective:    BP 144/79 mmHg  Pulse 111  Ht 5' 6" (1.676 m)  Wt 178 lb 6.4 oz (80.922 kg)  BMI 28.81 kg/m2  SpO2   LMP  (LMP Unknown)  Wt Readings from Last 3 Encounters:  11/18/15 178 lb 6.4 oz (80.922 kg)  11/14/15 180 lb (81.647 kg)  10/21/15 179 lb 3.2 oz (81.285 kg)    Physical Exam  Constitutional: She is oriented to person, place, and time. She appears well-developed and well-nourished. No distress.  HENT:  Head: Normocephalic and atraumatic.  Eyes: Conjunctivae and lids are normal. Right eye exhibits no discharge. Left eye exhibits no discharge. No scleral icterus.  Neck: Normal range of motion. Neck supple. No JVD present.  Carotid bruit is not present.  Cardiovascular: Regular rhythm and normal heart sounds.  Tachycardia present.   Recent stres test with Dr. Humphrey Rolls  Pulmonary/Chest: Effort normal and breath sounds normal.  Abdominal: Normal appearance. There is no splenomegaly or hepatomegaly.  Musculoskeletal: Normal range of motion.  Neurological: She is alert and oriented to person, place, and time.  Skin: Skin is warm, dry and intact. No rash noted. No pallor.  Psychiatric: She has a normal mood and affect. Her behavior is normal. Judgment and thought content normal.    Results for orders placed or performed during the hospital encounter of 11/14/15  Glucose, capillary  Result Value Ref Range   Glucose-Capillary 125 (H) 65 - 99 mg/dL      Assessment & Plan:   Problem List Items Addressed This Visit      Unprioritized   Hypertension - Primary    Change to Valsartan per pt request.  Will continue 25 mg Metoprolol as that seems to be helping her BP      Relevant Medications   valsartan-hydrochlorothiazide (DIOVAN-HCT) 320-25 MG tablet   Other Relevant Orders   Comprehensive metabolic panel   Insomnia    Discussed use of Magnesium and Melatonin       Other Visit  Diagnoses    Tachycardia        Relevant Orders    TSH    Chronic fatigue        Relevant Orders    CBC with Differential/Platelet    VITAMIN D 25 Hydroxy (Vit-D Deficiency, Fractures)    TSH        Follow up plan: Return in about 4 weeks (around 12/16/2015).

## 2015-11-18 NOTE — Assessment & Plan Note (Signed)
Change to Valsartan per pt request.  Will continue 25 mg Metoprolol as that seems to be helping her BP

## 2015-11-18 NOTE — Patient Instructions (Signed)
Take melatonin .5 to 1 mg an hour before bedtime with a Magnesium supplement.  I prefer Magnesium Citrate (I use something called Calm and Relax) which is better absorbed then the more common Magnesium Oxide.    Chamomile supplements help will help with anxiety.

## 2015-11-19 LAB — COMPREHENSIVE METABOLIC PANEL
A/G RATIO: 1.6 (ref 1.2–2.2)
ALT: 18 IU/L (ref 0–32)
AST: 14 IU/L (ref 0–40)
Albumin: 4.2 g/dL (ref 3.6–4.8)
Alkaline Phosphatase: 73 IU/L (ref 39–117)
BUN / CREAT RATIO: 10 — AB (ref 12–28)
BUN: 9 mg/dL (ref 8–27)
Bilirubin Total: 0.2 mg/dL (ref 0.0–1.2)
CALCIUM: 9.6 mg/dL (ref 8.7–10.3)
CO2: 22 mmol/L (ref 18–29)
Chloride: 95 mmol/L — ABNORMAL LOW (ref 96–106)
Creatinine, Ser: 0.87 mg/dL (ref 0.57–1.00)
GFR, EST AFRICAN AMERICAN: 79 mL/min/{1.73_m2} (ref >59)
GFR, EST NON AFRICAN AMERICAN: 68 mL/min/{1.73_m2} (ref >59)
GLOBULIN, TOTAL: 2.7 g/dL (ref 1.5–4.5)
Glucose: 248 mg/dL — ABNORMAL HIGH (ref 65–99)
POTASSIUM: 3.7 mmol/L (ref 3.5–5.2)
SODIUM: 137 mmol/L (ref 134–144)
TOTAL PROTEIN: 6.9 g/dL (ref 6.0–8.5)

## 2015-11-19 LAB — TSH: TSH: 5.25 u[IU]/mL — ABNORMAL HIGH (ref 0.450–4.500)

## 2015-11-19 LAB — CBC WITH DIFFERENTIAL/PLATELET
BASOS: 0 %
Basophils Absolute: 0 10*3/uL (ref 0.0–0.2)
EOS (ABSOLUTE): 0.1 10*3/uL (ref 0.0–0.4)
EOS: 1 %
HEMATOCRIT: 33.1 % — AB (ref 34.0–46.6)
Hemoglobin: 10.5 g/dL — ABNORMAL LOW (ref 11.1–15.9)
IMMATURE GRANS (ABS): 0 10*3/uL (ref 0.0–0.1)
IMMATURE GRANULOCYTES: 0 %
LYMPHS: 29 %
Lymphocytes Absolute: 1.3 10*3/uL (ref 0.7–3.1)
MCH: 26.8 pg (ref 26.6–33.0)
MCHC: 31.7 g/dL (ref 31.5–35.7)
MCV: 84 fL (ref 79–97)
MONOS ABS: 0.2 10*3/uL (ref 0.1–0.9)
Monocytes: 4 %
NEUTROS ABS: 3 10*3/uL (ref 1.4–7.0)
NEUTROS PCT: 66 %
Platelets: 245 10*3/uL (ref 150–379)
RBC: 3.92 x10E6/uL (ref 3.77–5.28)
RDW: 15.6 % — ABNORMAL HIGH (ref 12.3–15.4)
WBC: 4.6 10*3/uL (ref 3.4–10.8)

## 2015-11-19 LAB — VITAMIN D 25 HYDROXY (VIT D DEFICIENCY, FRACTURES): Vit D, 25-Hydroxy: 31.1 ng/mL (ref 30.0–100.0)

## 2015-11-20 ENCOUNTER — Telehealth: Payer: Self-pay | Admitting: Unknown Physician Specialty

## 2015-11-20 DIAGNOSIS — E1165 Type 2 diabetes mellitus with hyperglycemia: Principal | ICD-10-CM

## 2015-11-20 DIAGNOSIS — IMO0001 Reserved for inherently not codable concepts without codable children: Secondary | ICD-10-CM

## 2015-11-20 MED ORDER — GLIPIZIDE ER 5 MG PO TB24: 5.0000 mg | ORAL_TABLET | Freq: Every day | ORAL | Status: DC

## 2015-11-20 NOTE — Telephone Encounter (Signed)
Phone call with patient.  Discussed labs and high blood sugar.  Stop Januvia for no effect.  States BS is 161 in the AM.  She states she needs to think about insulin.  Will start Glipizide to take daily.  Will refer to the lifestyle center.  Will not start on a thyroid medication for now

## 2015-12-02 ENCOUNTER — Ambulatory Visit: Payer: BC Managed Care – PPO | Admitting: Diagnostic Neuroimaging

## 2015-12-11 ENCOUNTER — Ambulatory Visit
Admission: RE | Admit: 2015-12-11 | Discharge: 2015-12-11 | Disposition: A | Discharge status: Home / Self Care | Payer: Medicare Other | Source: Ambulatory Visit | Attending: Diagnostic Neuroimaging | Admitting: Diagnostic Neuroimaging

## 2015-12-11 DIAGNOSIS — R42 Dizziness and giddiness: Secondary | ICD-10-CM

## 2015-12-11 DIAGNOSIS — R269 Unspecified abnormalities of gait and mobility: Secondary | ICD-10-CM

## 2015-12-11 DIAGNOSIS — R4781 Slurred speech: Secondary | ICD-10-CM

## 2015-12-11 DIAGNOSIS — G450 Vertebro-basilar artery syndrome: Secondary | ICD-10-CM

## 2015-12-11 MED ORDER — GADOBENATE DIMEGLUMINE 529 MG/ML IV SOLN
16.0000 mL | Freq: Once | INTRAVENOUS | Status: AC | PRN
  Administered 2015-12-11: 16 mL via INTRAVENOUS

## 2015-12-14 ENCOUNTER — Ambulatory Visit (INDEPENDENT_AMBULATORY_CARE_PROVIDER_SITE_OTHER): Payer: Medicare Other | Admitting: Diagnostic Neuroimaging

## 2015-12-14 ENCOUNTER — Encounter: Payer: Self-pay | Admitting: Diagnostic Neuroimaging

## 2015-12-14 VITALS — BP 138/80 | HR 74 | Ht 63.0 in | Wt 181.2 lb

## 2015-12-14 DIAGNOSIS — I160 Hypertensive urgency: Secondary | ICD-10-CM

## 2015-12-14 DIAGNOSIS — R42 Dizziness and giddiness: Secondary | ICD-10-CM | POA: Diagnosis not present

## 2015-12-14 DIAGNOSIS — G450 Vertebro-basilar artery syndrome: Secondary | ICD-10-CM

## 2015-12-14 NOTE — Progress Notes (Signed)
[facility]  PATIENT: Jamie Murray DOB: Oct 02, 1945  REFERRING CLINICIAN: Katheren Puller, NP HISTORY FROM: patient  REASON FOR VISIT: follow up   HISTORICAL  CHIEF COMPLAINT:  Chief Complaint  Patient presents with  . Vertebrobasilar TIAs    rm 6, "have had 2 HA, took Meclizine and was fine, BP high both times; went to urgent care for hypertension;-new medicine"  . Follow-up    6 week, review results -- MRI, MRA on 12/11/15    HISTORY OF PRESENT ILLNESS:   UPDATE 12/14/15: Since last visit, had 2 more attacks. Both assoc with high systolic BP (979 and over 200). Now on better BP and DM meds. Test results reviewed.   PRIOR HPI (10/20/15): 70 year old right-handed female with history of depression, anxiety, diabetes, here for evaluation of dizziness and balance difficulty. Patient has had intermittent "spells" for past 5-6 years, every 2 weeks or every 2 months, lasting 1-3 days. She feels a foggy, cloudy, so he headed sensation or head. No spinning sensation nausea or vomiting. No passing out. She has had several falls. Sometimes she has slurred speech, memory loss and anxiety. Over the past 2 months patient has had increasing problems with balance and walking difficulty. Patient was diagnosed with possible vertigo problems as well as possible silent migraines. She has been treated with meclizine which seems to relieve her symptoms partially. She was also prescribed nortriptyline which did not help. No specific triggering or aggravating factors.   REVIEW OF SYSTEMS: Full 14 system review of systems performed and negative except HPI.  ALLERGIES: Allergies  Allergen Reactions  . Prozac [Fluoxetine Hcl] Other (See Comments)    "felt crazy"    HOME MEDICATIONS: Outpatient Prescriptions Prior to Visit  Medication Sig Dispense Refill  . amitriptyline (ELAVIL) 25 MG tablet Take 1 tablet (25 mg total) by mouth at bedtime. PRN sleep 30 tablet 3  . aspirin 81 MG  tablet Take 81 mg by mouth daily.    . clopidogrel (PLAVIX) 75 MG tablet Take 1 tablet (75 mg total) by mouth daily. 90 tablet 3  . Dulaglutide (TRULICITY) 1.5 GX/2.1JH SOPN Inject 1.5 mg into the skin once a week. 12 pen 3  . escitalopram (LEXAPRO) 20 MG tablet Take 1 tablet (20 mg total) by mouth daily. 90 tablet 1  . Flaxseed, Linseed, (FLAX SEED OIL PO) Take by mouth 2 (two) times daily.    Marland Kitchen glipiZIDE (GLUCOTROL XL) 5 MG 24 hr tablet Take 1 tablet (5 mg total) by mouth daily with breakfast. 30 tablet 2  . meclizine (ANTIVERT) 25 MG tablet Take 1 tablet (25 mg total) by mouth every 6 (six) hours as needed. Reported on 08/13/2015 30 tablet 1  . metFORMIN (GLUCOPHAGE-XR) 500 MG 24 hr tablet Take 1 tablet (500 mg total) by mouth 4 (four) times daily. (Patient taking differently: Take 500 mg by mouth. 4 tablets daily) 360 tablet 3  . metoprolol succinate (TOPROL-XL) 25 MG 24 hr tablet Take 12.5 mg by mouth daily.     . Multiple Vitamin (MULTIVITAMIN) tablet Take 1 tablet by mouth daily.    . pantoprazole (PROTONIX) 40 MG tablet Take 1 tablet (40 mg total) by mouth 2 (two) times daily. 60 tablet 11  . simvastatin (ZOCOR) 40 MG tablet Take 1 tablet (40 mg total) by mouth daily at 6 PM. 30 tablet 3  . valsartan-hydrochlorothiazide (DIOVAN-HCT) 320-25 MG tablet Take 1 tablet by mouth daily. DC Irbesartan 30 tablet 1   No facility-administered medications prior  to visit.    PAST MEDICAL HISTORY: Past Medical History  Diagnosis Date  . Diabetes ([place])   . Anxiety   . Osteoporosis   . Anemia   . Hypertension   . Hyperlipidemia   . Insomnia   . Depression   . [facility] spotted fever   . Allergy   . Transient cerebral ischemia   . GERD (gastroesophageal reflux disease)   . Migraines   . Vertigo     every 2-3 months    PAST SURGICAL HISTORY: Past Surgical History  Procedure Laterality Date  . Abdominal hysterectomy    . Tubal ligation    . Coronary angioplasty    .  Esophagogastroduodenoscopy (egd) with propofol N/A 08/24/2015    Procedure: ESOPHAGOGASTRODUODENOSCOPY (EGD) WITH PROPOFOL with dialation;  Surgeon: Lucilla Lame, MD;  Location: [place];  Service: Endoscopy;  Laterality: N/A;  Diabetic - oral meds    FAMILY HISTORY: Family History  Problem Relation Age of Onset  . Diabetes Mother   . Hypertension Mother   . Stroke Mother   . Heart disease Mother     MI  . Cancer Father     pancreatic  . Stroke Father   . Hypertension Sister   . Diabetes Brother   . Diabetes Maternal Grandfather   . Diabetes Sister   . Diabetes Sister   . Diabetes Brother   . Diabetes Brother     SOCIAL HISTORY:  Social History   Social History  . Marital Status: Married    Spouse Name: [name]  . Number of Children: 3  . Years of Education: 14   Occupational History  .      work part time, day care   Social History Main Topics  . Smoking status: Never Smoker   . Smokeless tobacco: Never Used  . Alcohol Use: No  . Drug Use: No  . Sexual Activity: No   Other Topics Concern  . Not on file   Social History Narrative   Lives with husband   Caffeine use- coffee 2 cups daily, Coke once a week, green tea daily     PHYSICAL EXAM  GENERAL EXAM/CONSTITUTIONAL: Vitals:  Filed Vitals:   12/14/15 1535  BP: 138/80  Pulse: 74  Height: _0  (1.6 m)  Weight: 181 lb 3.2 oz (82.192 kg)    Orthostatic Blood Pressure (10/20/15) Blood pressure: lying 154/89, sitting 149/85, standing 144/79 Pulse:  lying 73, sitting 71, standing 75  Body mass index is 32.11 kg/(m^2). No exam data present  Patient is in no distress; well developed, nourished and groomed; neck is supple  CARDIOVASCULAR:  Examination of carotid arteries is normal; no carotid bruits  Regular rate and rhythm, no murmurs  Examination of peripheral vascular system by observation and palpation is normal  EYES:  Ophthalmoscopic exam of optic discs and posterior segments is  normal; no papilledema or hemorrhages  MUSCULOSKELETAL:  Gait, strength, tone, movements noted in Neurologic exam below  NEUROLOGIC: MENTAL STATUS:  No flowsheet data found.  awake, alert, oriented to person, place and time  recent and remote memory intact  normal attention and concentration  language fluent, comprehension intact, naming intact,   fund of knowledge appropriate  CRANIAL NERVE:   2nd - no papilledema on fundoscopic exam  2nd, 3rd, 4th, 6th - pupils equal and reactive to light, visual fields full to confrontation, extraocular muscles intact, no nystagmus  5th - facial sensation symmetric  7th - facial strength symmetric  8th -  hearing intact  9th - palate elevates symmetrically, uvula midline  11th - shoulder shrug symmetric  12th - tongue protrusion midline  MOTOR:   normal bulk and tone, full strength in the BUE, BLE  SENSORY:   normal and symmetric to light touch, pinprick, temperature, vibration; DECR PP AND VIB AT TOES  COORDINATION:   finger-nose-finger, fine finger movements normal  REFLEXES:   deep tendon reflexes present and symmetric; TRACE AT ANKLES  GAIT/STATION:   narrow based gait; romberg is negative    DIAGNOSTIC DATA (LABS, IMAGING, TESTING) - I reviewed patient records, labs, notes, testing and imaging myself where available.  Lab Results  Component Value Date   WBC 4.6 11/18/2015   HGB 11.5* 04/10/2013   HCT 33.1* 11/18/2015   MCV 84 11/18/2015   PLT 245 11/18/2015      Component Value Date/Time   NA 137 11/18/2015 1403   NA 144 04/17/2012 1056   K 3.7 11/18/2015 1403   K 3.8 04/17/2012 1056   CL 95* 11/18/2015 1403   CL 107 04/17/2012 1056   CO2 22 11/18/2015 1403   CO2 29 04/17/2012 1056   GLUCOSE 248* 11/18/2015 1403   GLUCOSE 105* 04/17/2012 1056   BUN 9 11/18/2015 1403   BUN 12 04/17/2012 1056   CREATININE 0.87 11/18/2015 1403   CREATININE 0.87 09/25/2014 1028   CALCIUM 9.6 11/18/2015 1403    CALCIUM 8.8 04/17/2012 1056   PROT 6.9 11/18/2015 1403   PROT 7.2 04/17/2012 1056   ALBUMIN 4.2 11/18/2015 1403   ALBUMIN 3.7 04/17/2012 1056   AST 14 11/18/2015 1403   AST 24 04/17/2012 1056   ALT 18 11/18/2015 1403   ALT 19 04/17/2012 1056   ALKPHOS 73 11/18/2015 1403   ALKPHOS 58 04/17/2012 1056   BILITOT 0.2 11/18/2015 1403   BILITOT 0.3 04/17/2012 1056   GFRNONAA 68 11/18/2015 1403   GFRNONAA >60 09/25/2014 1028   GFRAA 79 11/18/2015 1403   GFRAA >60 09/25/2014 1028   Lab Results  Component Value Date   CHOL 139 07/22/2015   HDL 45 07/22/2015   LDLCALC 63 07/22/2015   TRIG 156* 07/22/2015   Lab Results  Component Value Date   HGBA1C 6.5* 04/18/2012   No results found for: [id number] Lab Results  Component Value Date   TSH 5.250* 11/18/2015    02/10/12 EKG [I reviewed images myself and agree with interpretation. -VRP]  - normal sinus rhythm; LVH, cannot rule out septal infarct.  04/17/12 carotid u/s  - There is no evidence of a hemodynamically significant carotid stenosis.   09/25/14 MRI brain [I reviewed images myself and agree with interpretation. -VRP]  - Question small acute infarct right midbrain versus artifact. - Otherwise negative. - Sinusitis.  12/11/15 MRI brain  1. There are a couple T2/FLAIR hyperintense foci consistent with very minimal chronic microvascular ischemic change. None of the foci appeared to be acute but 1 focus in the left periatrial white matter was not clearly seen on the prior MRI. 2. Chronic inflammatory changes noted in the right maxillary sinus. 3. There are no acute findings.  12/11/15 MRA head 1. There does not appear to be any region of stenosis. 2. No aneurysms are noted. 3. There is normal variant anatomy with the left vertebral artery terminating as the left posterior inferior cerebellar artery and the right posterior cerebral artery obtaining flow from the anterior circulation through a widely patent right  posterior communicating artery.  12/11/15 MRA neck - normal  ASSESSMENT AND PLAN  70 y.o. year old female here with 5-6 years intermittent spells of cloudy headed sensation, foggy headed sensation, dizziness, slurred speech, confusion, balance difficulty. Now associated with high BP attacks.   Ddx: likely hypertensive urgency; less likely TIA, migraine phenomenon, metabolic, diabetic neuropathy (+/- autonomic neuropathy)  1. Hypertensive urgency   2. Vertebrobasilar TIAs   3. Dizziness and giddiness     PLAN: - continue aspirin, plavix, BP, diabetes and cholesterol medications - continue to monitor blood pressure and blood sugar at home; monitor spells at home and work  Return if symptoms worsen or fail to improve, for return to PCP.    Penni Bombard, MD 3/50/0938, 1:82 PM Certified in Neurology, Neurophysiology and Neuroimaging  [facility] [address], [place], [place] [address] [phone]

## 2015-12-14 NOTE — Patient Instructions (Signed)
  Diagnosis: hypertensive urgency attacks (dizziness due to high blood pressure episodes)  PLAN: - continue aspirin, plavix, BP, diabetes and cholesterol medications - continue to monitor blood pressure and blood sugar at home; monitor spells at home and work

## 2015-12-18 ENCOUNTER — Ambulatory Visit (INDEPENDENT_AMBULATORY_CARE_PROVIDER_SITE_OTHER): Payer: Medicare Other | Admitting: Unknown Physician Specialty

## 2015-12-18 ENCOUNTER — Encounter: Payer: Self-pay | Admitting: Unknown Physician Specialty

## 2015-12-18 VITALS — BP 131/84 | HR 73 | Temp 98.1°F | Ht 66.5 in | Wt 178.2 lb

## 2015-12-18 DIAGNOSIS — I10 Essential (primary) hypertension: Secondary | ICD-10-CM

## 2015-12-18 NOTE — Assessment & Plan Note (Signed)
Stable, continue present medications.   

## 2015-12-18 NOTE — Progress Notes (Signed)
BP 131/84 mmHg  Pulse 73  Temp(Src) 98.1 F (36.7 C)  Ht 5' 6.5" (1.689 m)  Wt 178 lb 3.2 oz (80.831 kg)  BMI 28.33 kg/m2  SpO2   LMP  (LMP Unknown)   Subjective:    Patient ID: Jamie Murray, female    DOB: Jul 29, 1945, 70 y.o.   MRN: [id number]  HPI: Jamie Murray is a 70 y.o. female  Chief Complaint  Patient presents with  . Insomnia    Patient says she's sleeping a little better.   . Hypertension    States shes been checking her BP and it's been better with the valsartan.   Marland Kitchen Trulicity    Patient wants a demonstration/sample on using trulicity. Last time she tried, she spilled the medication out.    Hypertension Using medications without difficulty Average home BPs: It's doing well    No problems or lightheadedness No chest pain with exertion or shortness of breath No Edema  Relevant past medical, surgical, family and social history reviewed and updated as indicated. Interim medical history since our last visit reviewed. Allergies and medications reviewed and updated.  Review of Systems  Per HPI unless specifically indicated above     Objective:    BP 131/84 mmHg  Pulse 73  Temp(Src) 98.1 F (36.7 C)  Ht 5' 6.5" (1.689 m)  Wt 178 lb 3.2 oz (80.831 kg)  BMI 28.33 kg/m2  SpO2   LMP  (LMP Unknown)  Wt Readings from Last 3 Encounters:  12/18/15 178 lb 3.2 oz (80.831 kg)  12/14/15 181 lb 3.2 oz (82.192 kg)  11/18/15 178 lb 6.4 oz (80.922 kg)    Physical Exam  Constitutional: She is oriented to person, place, and time. She appears well-developed and well-nourished. No distress.  HENT:  Head: Normocephalic and atraumatic.  Eyes: Conjunctivae and lids are normal. Right eye exhibits no discharge. Left eye exhibits no discharge. No scleral icterus.  Neck: Normal range of motion. Neck supple. No JVD present. Carotid bruit is not present.  Cardiovascular: Normal rate, regular rhythm and normal heart sounds.   Pulmonary/Chest: Effort normal and  breath sounds normal.  Abdominal: Normal appearance. There is no splenomegaly or hepatomegaly.  Musculoskeletal: Normal range of motion.  Neurological: She is alert and oriented to person, place, and time.  Skin: Skin is warm, dry and intact. No rash noted. No pallor.  Psychiatric: She has a normal mood and affect. Her behavior is normal. Judgment and thought content normal.    Results for orders placed or performed in visit on 11/18/15  CBC with Differential/Platelet  Result Value Ref Range   WBC 4.6 3.4 - 10.8 x10E3/uL   RBC 3.92 3.77 - 5.28 x10E6/uL   Hemoglobin 10.5 (L) 11.1 - 15.9 g/dL   Hematocrit 33.1 (L) 34.0 - 46.6 %   MCV 84 79 - 97 fL   MCH 26.8 26.6 - 33.0 pg   MCHC 31.7 31.5 - 35.7 g/dL   RDW 15.6 (H) 12.3 - 15.4 %   Platelets 245 150 - 379 x10E3/uL   Neutrophils 66 %   Lymphs 29 %   Monocytes 4 %   Eos 1 %   Basos 0 %   Neutrophils Absolute 3.0 1.4 - 7.0 x10E3/uL   Lymphocytes Absolute 1.3 0.7 - 3.1 x10E3/uL   Monocytes Absolute 0.2 0.1 - 0.9 x10E3/uL   EOS (ABSOLUTE) 0.1 0.0 - 0.4 x10E3/uL   Basophils Absolute 0.0 0.0 - 0.2 x10E3/uL   Immature Granulocytes 0 %  Immature Grans (Abs) 0.0 0.0 - 0.1 x10E3/uL  Comprehensive metabolic panel  Result Value Ref Range   Glucose 248 (H) 65 - 99 mg/dL   BUN 9 8 - 27 mg/dL   Creatinine, Ser 0.87 0.57 - 1.00 mg/dL   GFR calc non Af Amer 68 >59 mL/min/1.73   GFR calc Af Amer 79 >59 mL/min/1.73   BUN/Creatinine Ratio 10 (L) 12 - 28   Sodium 137 134 - 144 mmol/L   Potassium 3.7 3.5 - 5.2 mmol/L   Chloride 95 (L) 96 - 106 mmol/L   CO2 22 18 - 29 mmol/L   Calcium 9.6 8.7 - 10.3 mg/dL   Total Protein 6.9 6.0 - 8.5 g/dL   Albumin 4.2 3.6 - 4.8 g/dL   Globulin, Total 2.7 1.5 - 4.5 g/dL   Albumin/Globulin Ratio 1.6 1.2 - 2.2   Bilirubin Total 0.2 0.0 - 1.2 mg/dL   Alkaline Phosphatase 73 39 - 117 IU/L   AST 14 0 - 40 IU/L   ALT 18 0 - 32 IU/L  VITAMIN D 25 Hydroxy (Vit-D Deficiency, Fractures)  Result Value Ref Range    Vit D, 25-Hydroxy 31.1 30.0 - 100.0 ng/mL  TSH  Result Value Ref Range   TSH 5.250 (H) 0.450 - 4.500 uIU/mL      Assessment & Plan:   Problem List Items Addressed This Visit      Unprioritized   Essential (primary) hypertension - Primary    Stable, continue present medications.            Follow up plan: Return in about 4 weeks (around 01/15/2016) for Diabetes.

## 2015-12-22 ENCOUNTER — Other Ambulatory Visit: Payer: Self-pay

## 2015-12-22 ENCOUNTER — Emergency Department: Payer: Medicare Other

## 2015-12-22 ENCOUNTER — Inpatient Hospital Stay
Admission: EM | Admit: 2015-12-22 | Discharge: 2015-12-24 | DRG: 683 | Disposition: A | Discharge status: Home / Self Care | Payer: Medicare Other | Attending: Internal Medicine | Admitting: Internal Medicine

## 2015-12-22 ENCOUNTER — Encounter: Payer: Self-pay | Admitting: Emergency Medicine

## 2015-12-22 ENCOUNTER — Ambulatory Visit: Payer: Medicare Other | Admitting: Family Medicine

## 2015-12-22 DIAGNOSIS — I959 Hypotension, unspecified: Secondary | ICD-10-CM | POA: Diagnosis present

## 2015-12-22 DIAGNOSIS — Z9861 Coronary angioplasty status: Secondary | ICD-10-CM

## 2015-12-22 DIAGNOSIS — E860 Dehydration: Secondary | ICD-10-CM | POA: Diagnosis present

## 2015-12-22 DIAGNOSIS — Z7902 Long term (current) use of antithrombotics/antiplatelets: Secondary | ICD-10-CM | POA: Diagnosis not present

## 2015-12-22 DIAGNOSIS — Z90710 Acquired absence of both cervix and uterus: Secondary | ICD-10-CM

## 2015-12-22 DIAGNOSIS — Z79899 Other long term (current) drug therapy: Secondary | ICD-10-CM | POA: Diagnosis not present

## 2015-12-22 DIAGNOSIS — Z7982 Long term (current) use of aspirin: Secondary | ICD-10-CM | POA: Diagnosis not present

## 2015-12-22 DIAGNOSIS — E785 Hyperlipidemia, unspecified: Secondary | ICD-10-CM | POA: Diagnosis present

## 2015-12-22 DIAGNOSIS — I10 Essential (primary) hypertension: Secondary | ICD-10-CM | POA: Diagnosis present

## 2015-12-22 DIAGNOSIS — K219 Gastro-esophageal reflux disease without esophagitis: Secondary | ICD-10-CM | POA: Diagnosis present

## 2015-12-22 DIAGNOSIS — N289 Disorder of kidney and ureter, unspecified: Secondary | ICD-10-CM

## 2015-12-22 DIAGNOSIS — R109 Unspecified abdominal pain: Secondary | ICD-10-CM | POA: Diagnosis present

## 2015-12-22 DIAGNOSIS — N179 Acute kidney failure, unspecified: Principal | ICD-10-CM | POA: Diagnosis present

## 2015-12-22 DIAGNOSIS — M810 Age-related osteoporosis without current pathological fracture: Secondary | ICD-10-CM | POA: Diagnosis present

## 2015-12-22 DIAGNOSIS — E871 Hypo-osmolality and hyponatremia: Secondary | ICD-10-CM | POA: Diagnosis present

## 2015-12-22 DIAGNOSIS — R197 Diarrhea, unspecified: Secondary | ICD-10-CM | POA: Diagnosis not present

## 2015-12-22 DIAGNOSIS — E119 Type 2 diabetes mellitus without complications: Secondary | ICD-10-CM | POA: Diagnosis present

## 2015-12-22 DIAGNOSIS — E876 Hypokalemia: Secondary | ICD-10-CM | POA: Diagnosis present

## 2015-12-22 DIAGNOSIS — R000 Tachycardia, unspecified: Secondary | ICD-10-CM | POA: Diagnosis present

## 2015-12-22 DIAGNOSIS — Z8673 Personal history of transient ischemic attack (TIA), and cerebral infarction without residual deficits: Secondary | ICD-10-CM

## 2015-12-22 DIAGNOSIS — N1832 Chronic kidney disease, stage 3b: Secondary | ICD-10-CM | POA: Diagnosis present

## 2015-12-22 DIAGNOSIS — Z7984 Long term (current) use of oral hypoglycemic drugs: Secondary | ICD-10-CM | POA: Diagnosis not present

## 2015-12-22 DIAGNOSIS — G4700 Insomnia, unspecified: Secondary | ICD-10-CM | POA: Diagnosis present

## 2015-12-22 DIAGNOSIS — Z9851 Tubal ligation status: Secondary | ICD-10-CM | POA: Diagnosis not present

## 2015-12-22 HISTORY — DX: Hypokalemia: E87.6

## 2015-12-22 LAB — GASTROINTESTINAL PANEL BY PCR, STOOL (REPLACES STOOL CULTURE)

## 2015-12-22 LAB — URINALYSIS COMPLETE WITH MICROSCOPIC (ARMC ONLY)
Bilirubin Urine: NEGATIVE
Glucose, UA: 50 mg/dL — AB
Hgb urine dipstick: NEGATIVE
LEUKOCYTES UA: NEGATIVE
Nitrite: NEGATIVE
PROTEIN: 100 mg/dL — AB
Specific Gravity, Urine: 1.025 (ref 1.005–1.030)
pH: 5 (ref 5.0–8.0)

## 2015-12-22 LAB — MAGNESIUM: Magnesium: 1.3 mg/dL — ABNORMAL LOW (ref 1.7–2.4)

## 2015-12-22 LAB — COMPREHENSIVE METABOLIC PANEL
ALT: 16 U/L (ref 14–54)
AST: 25 U/L (ref 15–41)
Albumin: 3.6 g/dL (ref 3.5–5.0)
Alkaline Phosphatase: 102 U/L (ref 38–126)
Anion gap: 18 — ABNORMAL HIGH (ref 5–15)
BUN: 21 mg/dL — ABNORMAL HIGH (ref 6–20)
CHLORIDE: 95 mmol/L — AB (ref 101–111)
CO2: 20 mmol/L — AB (ref 22–32)
Calcium: 8.3 mg/dL — ABNORMAL LOW (ref 8.9–10.3)
Creatinine, Ser: 1.95 mg/dL — ABNORMAL HIGH (ref 0.44–1.00)
GFR, EST AFRICAN AMERICAN: 29 mL/min — AB (ref >60)
GFR, EST NON AFRICAN AMERICAN: 25 mL/min — AB (ref >60)
Glucose, Bld: 393 mg/dL — ABNORMAL HIGH (ref 65–99)
POTASSIUM: 2.5 mmol/L — AB (ref 3.5–5.1)
SODIUM: 133 mmol/L — AB (ref 135–145)
Total Bilirubin: 0.4 mg/dL (ref 0.3–1.2)
Total Protein: 6.9 g/dL (ref 6.5–8.1)

## 2015-12-22 LAB — CBC
HEMATOCRIT: 41.5 % (ref 35.0–47.0)
Hemoglobin: 13.8 g/dL (ref 12.0–16.0)
MCH: 27.9 pg (ref 26.0–34.0)
MCHC: 33.2 g/dL (ref 32.0–36.0)
MCV: 84.1 fL (ref 80.0–100.0)
Platelets: 327 10*3/uL (ref 150–440)
RBC: 4.93 MIL/uL (ref 3.80–5.20)
RDW: 15.8 % — ABNORMAL HIGH (ref 11.5–14.5)
WBC: 10.9 10*3/uL (ref 3.6–11.0)

## 2015-12-22 LAB — PHOSPHORUS: PHOSPHORUS: 4.4 mg/dL (ref 2.5–4.6)

## 2015-12-22 LAB — C DIFFICILE QUICK SCREEN W PCR REFLEX
C DIFFICILE (CDIFF) INTERP: NOT DETECTED
C DIFFICILE (CDIFF) TOXIN: NEGATIVE
C Diff antigen: NEGATIVE

## 2015-12-22 LAB — GLUCOSE, CAPILLARY
GLUCOSE-CAPILLARY: 239 mg/dL — AB (ref 65–99)
GLUCOSE-CAPILLARY: 263 mg/dL — AB (ref 65–99)
GLUCOSE-CAPILLARY: 196 mg/dL — AB (ref 65–99)
Glucose-Capillary: 192 mg/dL — ABNORMAL HIGH (ref 65–99)

## 2015-12-22 LAB — LIPASE, BLOOD: LIPASE: 19 U/L (ref 11–51)

## 2015-12-22 LAB — TROPONIN I

## 2015-12-22 MED ORDER — SODIUM CHLORIDE 0.9 % IV BOLUS (SEPSIS)
1000.0000 mL | Freq: Once | INTRAVENOUS | Status: AC
  Administered 2015-12-22: 1000 mL via INTRAVENOUS

## 2015-12-22 MED ORDER — SODIUM CHLORIDE 0.9 % IV SOLN
Freq: Once | INTRAVENOUS | Status: AC
  Administered 2015-12-22: 08:00:00 via INTRAVENOUS

## 2015-12-22 MED ORDER — ASPIRIN EC 81 MG PO TBEC
81.0000 mg | DELAYED_RELEASE_TABLET | Freq: Every day | ORAL | Status: DC
  Administered 2015-12-22 – 2015-12-23 (×2): 81 mg via ORAL
  Filled 2015-12-22 (×2): qty 1

## 2015-12-22 MED ORDER — INSULIN ASPART 100 UNIT/ML ~~LOC~~ SOLN
0.0000 [IU] | Freq: Three times a day (TID) | SUBCUTANEOUS | Status: DC
  Administered 2015-12-22: 5 [IU] via SUBCUTANEOUS
  Administered 2015-12-22: 2 [IU] via SUBCUTANEOUS
  Administered 2015-12-23: 3 [IU] via SUBCUTANEOUS
  Administered 2015-12-23: 5 [IU] via SUBCUTANEOUS
  Administered 2015-12-23: 3 [IU] via SUBCUTANEOUS
  Administered 2015-12-24: 5 [IU] via SUBCUTANEOUS
  Filled 2015-12-22 (×2): qty 3
  Filled 2015-12-22 (×3): qty 5
  Filled 2015-12-22: qty 2

## 2015-12-22 MED ORDER — ONDANSETRON HCL 4 MG PO TABS: 4.0000 mg | ORAL_TABLET | Freq: Four times a day (QID) | ORAL | Status: DC | PRN

## 2015-12-22 MED ORDER — DIATRIZOATE MEGLUMINE & SODIUM 66-10 % PO SOLN: 15.0000 mL | ORAL | Status: AC

## 2015-12-22 MED ORDER — SIMVASTATIN 40 MG PO TABS
40.0000 mg | ORAL_TABLET | Freq: Every day | ORAL | Status: DC
  Administered 2015-12-22 – 2015-12-23 (×2): 40 mg via ORAL
  Filled 2015-12-22 (×2): qty 1

## 2015-12-22 MED ORDER — ONDANSETRON HCL 4 MG/2ML IJ SOLN: 4.0000 mg | Freq: Four times a day (QID) | INTRAMUSCULAR | Status: DC | PRN

## 2015-12-22 MED ORDER — ESCITALOPRAM OXALATE 10 MG PO TABS
20.0000 mg | ORAL_TABLET | Freq: Every day | ORAL | Status: DC
  Administered 2015-12-22 – 2015-12-24 (×3): 20 mg via ORAL
  Filled 2015-12-22 (×3): qty 2

## 2015-12-22 MED ORDER — AMITRIPTYLINE HCL 25 MG PO TABS
25.0000 mg | ORAL_TABLET | Freq: Every day | ORAL | Status: DC
  Administered 2015-12-22 – 2015-12-23 (×2): 25 mg via ORAL
  Filled 2015-12-22 (×2): qty 1

## 2015-12-22 MED ORDER — POTASSIUM CHLORIDE 10 MEQ/100ML IV SOLN
10.0000 meq | INTRAVENOUS | Status: AC
  Administered 2015-12-22 (×2): 10 meq via INTRAVENOUS
  Filled 2015-12-22 (×2): qty 100

## 2015-12-22 MED ORDER — MECLIZINE HCL 25 MG PO TABS: 25.0000 mg | ORAL_TABLET | Freq: Four times a day (QID) | ORAL | Status: DC | PRN

## 2015-12-22 MED ORDER — HYDROCODONE-ACETAMINOPHEN 5-325 MG PO TABS
1.0000 | ORAL_TABLET | ORAL | Status: DC | PRN
  Administered 2015-12-24: 1 via ORAL
  Filled 2015-12-22: qty 1

## 2015-12-22 MED ORDER — AMITRIPTYLINE HCL 25 MG PO TABS: 25.0000 mg | ORAL_TABLET | Freq: Every day | ORAL | Status: DC

## 2015-12-22 MED ORDER — POTASSIUM CHLORIDE CRYS ER 20 MEQ PO TBCR
40.0000 meq | EXTENDED_RELEASE_TABLET | Freq: Once | ORAL | Status: AC
  Administered 2015-12-22: 40 meq via ORAL
  Filled 2015-12-22 (×2): qty 2

## 2015-12-22 MED ORDER — POTASSIUM CHLORIDE CRYS ER 20 MEQ PO TBCR
40.0000 meq | EXTENDED_RELEASE_TABLET | Freq: Once | ORAL | Status: AC
  Administered 2015-12-22: 40 meq via ORAL
  Filled 2015-12-22: qty 2

## 2015-12-22 MED ORDER — HEPARIN SODIUM (PORCINE) 5000 UNIT/ML IJ SOLN
5000.0000 [IU] | Freq: Three times a day (TID) | INTRAMUSCULAR | Status: DC
  Administered 2015-12-22 – 2015-12-24 (×6): 5000 [IU] via SUBCUTANEOUS
  Filled 2015-12-22 (×6): qty 1

## 2015-12-22 MED ORDER — CLOPIDOGREL BISULFATE 75 MG PO TABS
75.0000 mg | ORAL_TABLET | Freq: Every day | ORAL | Status: DC
  Administered 2015-12-22 – 2015-12-24 (×3): 75 mg via ORAL
  Filled 2015-12-22 (×3): qty 1

## 2015-12-22 MED ORDER — ACETAMINOPHEN 325 MG PO TABS: 650.0000 mg | ORAL_TABLET | Freq: Four times a day (QID) | ORAL | Status: DC | PRN

## 2015-12-22 MED ORDER — ALBUTEROL SULFATE (2.5 MG/3ML) 0.083% IN NEBU
2.5000 mg | INHALATION_SOLUTION | RESPIRATORY_TRACT | Status: DC | PRN
Start: 2015-12-22 — End: 2015-12-24

## 2015-12-22 MED ORDER — PANTOPRAZOLE SODIUM 40 MG PO TBEC
40.0000 mg | DELAYED_RELEASE_TABLET | Freq: Two times a day (BID) | ORAL | Status: DC
  Administered 2015-12-22 – 2015-12-24 (×5): 40 mg via ORAL
  Filled 2015-12-22 (×5): qty 1

## 2015-12-22 MED ORDER — INSULIN ASPART 100 UNIT/ML ~~LOC~~ SOLN
0.0000 [IU] | Freq: Every day | SUBCUTANEOUS | Status: DC
  Administered 2015-12-23: 2 [IU] via SUBCUTANEOUS
  Filled 2015-12-22: qty 2

## 2015-12-22 MED ORDER — MAGNESIUM SULFATE 4 GM/100ML IV SOLN
4.0000 g | Freq: Once | INTRAVENOUS | Status: AC
  Administered 2015-12-22: 4 g via INTRAVENOUS
  Filled 2015-12-22: qty 100

## 2015-12-22 MED ORDER — ACETAMINOPHEN 650 MG RE SUPP: 650.0000 mg | Freq: Four times a day (QID) | RECTAL | Status: DC | PRN

## 2015-12-22 NOTE — ED Notes (Signed)
Pt presents to ED via EMS from home with c/o abd cramping and frequent diarrhea for the past several days. Denies n/v.

## 2015-12-22 NOTE — ED Notes (Addendum)
Dr. Lovena Le at bedside for rectal, guiac negative

## 2015-12-22 NOTE — ED Notes (Signed)
Pt vomited immediately after potassium PO administration.  Pt cleaned, gown changed, sheets changed, pt denies current nausea.  Dr. Lovena Le informed

## 2015-12-22 NOTE — H&P (Addendum)
[facility] NAME: Jamie Murray    MR#:  [id number]  DATE OF BIRTH:  [birth year]  DATE OF ADMISSION:  12/22/2015  PRIMARY CARE PHYSICIAN: Kathrine Haddock, NP   REQUESTING/REFERRING PHYSICIAN: Ruby Cola, MD  CHIEF COMPLAINT:   Chief Complaint  Patient presents with  . Diarrhea  . Abdominal Cramping    HISTORY OF PRESENT ILLNESS:  Jamie Murray  is a 71 y.o. female with a known history of HTN, DM, anemia and GERD. She has had watery diarrhea multiple times a day for 4 days. She feels dizzy and weak, has abdominal cramp, vomited once in ED,  but denies any fever, chills, abdominal pain, melena or bloody stool. She eat at restaurant 4 days ago. No history of ill contact or recent travel. She was found hypotension, tachycaria, renal failure and K 2.5 in ED, being given NS iv and KCl.  PAST MEDICAL HISTORY:   Past Medical History  Diagnosis Date  . Diabetes ([place])   . Anxiety   . Osteoporosis   . Anemia   . Hypertension   . Hyperlipidemia   . Insomnia   . Depression   . [facility] spotted fever   . Allergy   . Transient cerebral ischemia   . GERD (gastroesophageal reflux disease)   . Migraines   . Vertigo     every 2-3 months    PAST SURGICAL HISTORY:   Past Surgical History  Procedure Laterality Date  . Abdominal hysterectomy    . Tubal ligation    . Coronary angioplasty    . Esophagogastroduodenoscopy (egd) with propofol N/A 08/24/2015    Procedure: ESOPHAGOGASTRODUODENOSCOPY (EGD) WITH PROPOFOL with dialation;  Surgeon: Lucilla Lame, MD;  Location: [facility];  Service: Endoscopy;  Laterality: N/A;  Diabetic - oral meds    SOCIAL HISTORY:   Social History  Substance Use Topics  . Smoking status: Never Smoker   . Smokeless tobacco: Never Used  . Alcohol Use: No    FAMILY HISTORY:   Family History  Problem Relation Age of Onset  . Diabetes Mother   . Hypertension Mother   . Stroke Mother     . Heart disease Mother     MI  . Cancer Father     pancreatic  . Stroke Father   . Hypertension Sister   . Diabetes Brother   . Diabetes Maternal Grandfather   . Diabetes Sister   . Diabetes Sister   . Diabetes Brother   . Diabetes Brother     DRUG ALLERGIES:   Allergies  Allergen Reactions  . Prozac [Fluoxetine Hcl] Other (See Comments)    "felt crazy"    REVIEW OF SYSTEMS:   Review of Systems  Constitutional: Positive for malaise/fatigue. Negative for fever and chills.  Eyes: Negative for blurred vision and double vision.  Respiratory: Negative for cough, sputum production, shortness of breath and wheezing.   Cardiovascular: Negative for chest pain, palpitations, orthopnea and leg swelling.  Gastrointestinal: Positive for nausea, vomiting and diarrhea. Negative for abdominal pain, blood in stool and melena.  Genitourinary: Negative for dysuria, urgency, frequency and flank pain.  Musculoskeletal: Negative for joint pain.  Skin: Negative.   Neurological: Positive for dizziness and weakness. Negative for focal weakness, seizures, loss of consciousness and headaches.  Psychiatric/Behavioral: Negative for depression. The patient is not nervous/anxious.     MEDICATIONS AT HOME:   Prior to Admission medications   Medication Sig Start Date  End Date Taking? Authorizing Provider  aspirin 81 MG tablet Take 81 mg by mouth daily.   Yes Historical Provider, MD  Dulaglutide (TRULICITY) 1.5 DI/2.6EB SOPN Inject 1.5 mg into the skin once a week. 02/06/15  Yes Kathrine Haddock, NP  Flaxseed, Linseed, (FLAX SEED OIL PO) Take by mouth 2 (two) times daily.   Yes Historical Provider, MD  JANUVIA 100 MG tablet Take 100 mg by mouth daily.  11/16/15  Yes Historical Provider, MD  metoprolol succinate (TOPROL-XL) 25 MG 24 hr tablet Take 12.5 mg by mouth daily.  04/15/14  Yes Historical Provider, MD  Multiple Vitamin (MULTIVITAMIN) tablet Take 1 tablet by mouth daily.   Yes Historical Provider, MD   amitriptyline (ELAVIL) 25 MG tablet Take 1 tablet (25 mg total) by mouth at bedtime. PRN sleep 08/24/15   Kathrine Haddock, NP  clopidogrel (PLAVIX) 75 MG tablet Take 1 tablet (75 mg total) by mouth daily. 10/02/15   Kathrine Haddock, NP  escitalopram (LEXAPRO) 20 MG tablet Take 1 tablet (20 mg total) by mouth daily. 05/18/15   Kathrine Haddock, NP  glipiZIDE (GLUCOTROL XL) 5 MG 24 hr tablet Take 1 tablet (5 mg total) by mouth daily with breakfast. 11/20/15   Kathrine Haddock, NP  meclizine (ANTIVERT) 25 MG tablet Take 1 tablet (25 mg total) by mouth every 6 (six) hours as needed. Reported on 08/13/2015 Patient taking differently: Take 25 mg by mouth every 6 (six) hours as needed for dizziness. Reported on 08/13/2015 09/21/15   Kathrine Haddock, NP  metFORMIN (GLUCOPHAGE-XR) 500 MG 24 hr tablet Take 1 tablet (500 mg total) by mouth 4 (four) times daily. Patient taking differently: Take 500 mg by mouth. 4 tablets daily 02/02/15   Kathrine Haddock, NP  pantoprazole (PROTONIX) 40 MG tablet Take 1 tablet (40 mg total) by mouth 2 (two) times daily. 08/24/15   Lucilla Lame, MD  simvastatin (ZOCOR) 40 MG tablet Take 1 tablet (40 mg total) by mouth daily at 6 PM. 08/24/15   Kathrine Haddock, NP  valsartan-hydrochlorothiazide (DIOVAN-HCT) 320-25 MG tablet Take 1 tablet by mouth daily. DC Irbesartan 11/18/15   Kathrine Haddock, NP      VITAL SIGNS:  Blood pressure 126/76, pulse 109, temperature 97.4 F (36.3 C), temperature source Oral, resp. rate 24, height _0  (1.702 m), weight 178 lb (80.74 kg), SpO2 99 %.  PHYSICAL EXAMINATION:  Physical Exam  Constitutional: She is oriented to person, place, and time and well-developed, well-nourished, and in no distress. No distress.  HENT:  Head: Normocephalic.  Mouth/Throat: Oropharynx is clear and moist.  Eyes: Conjunctivae and EOM are normal. Pupils are equal, round, and reactive to light.  Neck: Neck supple. No JVD present. No tracheal deviation present. No thyromegaly present.   Cardiovascular: Normal rate, regular rhythm, normal heart sounds and intact distal pulses.  Exam reveals no friction rub.   No murmur heard. Pulmonary/Chest: Effort normal and breath sounds normal. No respiratory distress. She has no wheezes. She has no rales.  Abdominal: Soft. Bowel sounds are normal. She exhibits no distension. There is no tenderness. There is no rebound and no guarding.  Musculoskeletal: Normal range of motion. She exhibits no edema or tenderness.  Lymphadenopathy:    She has no cervical adenopathy.  Neurological: She is alert and oriented to person, place, and time. She has normal reflexes. No cranial nerve deficit.  Skin: Skin is warm and dry. No rash noted. No erythema.  Psychiatric: Mood, memory, affect and judgment normal.    GENERAL:  70 y.o.-year-old patient lying in the bed with no acute distress.  EYES: Pupils equal, round, reactive to light and accommodation. No scleral icterus. Extraocular muscles intact.  HEENT: Head atraumatic, normocephalic. Oropharynx and nasopharynx clear.  NECK:  Supple, no jugular venous distention. No thyroid enlargement, no tenderness.  LUNGS: Normal breath sounds bilaterally, no wheezing, rales,rhonchi or crepitation. No use of accessory muscles of respiration.  CARDIOVASCULAR: S1, S2 normal. No murmurs, rubs, or gallops.  ABDOMEN: Soft, nontender, nondistended. Bowel sounds present. No organomegaly or mass.  EXTREMITIES: No pedal edema, cyanosis, or clubbing.  NEUROLOGIC: Cranial nerves II through XII are intact. Muscle strength 5/5 in all extremities. Sensation intact. Gait not checked.  PSYCHIATRIC: The patient is alert and oriented x 3.  SKIN: No obvious rash, lesion, or ulcer.   LABORATORY PANEL:   CBC  Recent Labs Lab 12/22/15 0500  WBC 10.9  HGB 13.8  HCT 41.5  PLT 327   ------------------------------------------------------------------------------------------------------------------  Chemistries   Recent  Labs Lab 12/22/15 0500  NA 133*  K 2.5*  CL 95*  CO2 20*  GLUCOSE 393*  BUN 21*  CREATININE 1.95*  CALCIUM 8.3*  AST 25  ALT 16  ALKPHOS 102  BILITOT 0.4   ------------------------------------------------------------------------------------------------------------------  Cardiac Enzymes  Recent Labs Lab 12/22/15 0500  TROPONINI <0.03   ------------------------------------------------------------------------------------------------------------------  RADIOLOGY:  Ct Abdomen Pelvis Wo Contrast  12/22/2015  CLINICAL DATA:  Abdominal pain and diarrhea EXAM: CT ABDOMEN AND PELVIS WITHOUT CONTRAST TECHNIQUE: Multidetector CT imaging of the abdomen and pelvis was performed following the standard protocol without IV contrast. COMPARISON:  None. FINDINGS: Lower chest and abdominal wall:  No contributory findings. Hepatobiliary: No focal liver abnormality.Rim of pericholecystic edema. No visible calcified gallstone. No bile duct dilatation. Pancreas: Unremarkable. Spleen: Unremarkable. Adrenals/Urinary Tract: Negative adrenals. No hydronephrosis or stone. Unremarkable bladder. Stomach/Bowel:  No obstruction. No appendicitis. Reproductive:Hysterectomy and possible oophorectomies. Vascular/Lymphatic: No acute vascular abnormality. Prominent number of mesenteric lymph nodes but nonenlarged and likely incidental. Other: No ascites or pneumoperitoneum. Musculoskeletal: No acute abnormalities. Advanced lower lumbar facet arthropathy. IMPRESSION: 1. Pericholecystic fluid. If acute cholecystitis is a clinical possibility, recommend sonography to evaluate for calculi. 2. No other explanation for abdominal pain. Electronically Signed   By: Monte Fantasia M.D.   On: 12/22/2015 06:12   Dg Chest 1 View  12/22/2015  CLINICAL DATA:  Initial evaluation for acute abdominal pain and diarrhea. EXAM: CHEST 1 VIEW COMPARISON:  Air radiograph from 04/17/2012. FINDINGS: The cardiac and mediastinal silhouettes are  stable in size and contour, and remain within normal limits. Minimal plaque within the aortic arch. The lungs are hypoinflated. Associated mild bibasilar atelectasis. No focal infiltrates. No pulmonary edema or pleural effusion. No pneumothorax. No acute osseous abnormality identified. IMPRESSION: 1. Shallow lung inflation with mild bibasilar atelectasis. No other active cardiopulmonary disease. 2. Aortic atherosclerosis. Electronically Signed   By: Jeannine Boga M.D.   On: 12/22/2015 05:56      IMPRESSION AND PLAN:   Acute diarrhea. IVF support. Follow up stool test.  ARF and hyponatremia. Admitted to medical floor, hold diovan-HCTZ, give NS iv, follow up BMP.  Hypokalemia. Give KC iv and po, follow up BMP and mag.  Tachycardia due to above.   DM2. Hold po medication, start sliding scale.  HTN. Hold HTN medication due to low BP.    All the records are reviewed and case discussed with ED provider. Management plans discussed with the patient, her husband and son,  and they are in agreement.  CODE STATUS: full code.  TOTAL TIME TAKING CARE OF THIS PATIENT: 55 minutes.    Demetrios Loll M.D on 12/22/2015 at 8:09 AM  Between 7am to 6pm - Pager - [phone]  After 6pm go to [email or web address] - Proofreader  [facility]  Office  [phone]  CC: Primary care physician; Kathrine Haddock, NP   Note: This dictation was prepared with Dragon dictation along with smaller phrase technology. Any transcriptional errors that result from this process are unintentional.

## 2015-12-22 NOTE — ED Provider Notes (Addendum)
[facility] Emergency Department Provider Note  ___________________________________________  Time seen: Approximately 4:59 AM  I have reviewed the triage vital signs and the nursing notes.   HISTORY  Chief Complaint Diarrhea and Abdominal Cramping  HPI Jamie Murray is a 71 y.o. female who has had multiple episodes of diarrhea and abdominal cramping that has progressively worsened over the past 3-4 days. Patient states that the diarrhea has been diffuse and watery and it initially got better for about 24 hours but is back again and is just as bad as it was when this started. Patient has had a lot of abdominal cramping but mostly comes right before the diarrhea and then get some better. She denies any fever or chills but states she has been sweating. Patient is diabetic and has history of hypertension but has not taken any of her medicines and the 4 days because she wasn't able to eat with the diarrhea. She is also on Plavix and has not taken that as well. On arrival to the ER patient was tachycardic and mildly hypotensive. Patient states her pain in her abdomen at this time is about a 3 and is more of a colicky crampy type of pain. She has not recently been on antibiotics.   Past Medical History  Diagnosis Date  . Diabetes ([place])   . Anxiety   . Osteoporosis   . Anemia   . Hypertension   . Hyperlipidemia   . Insomnia   . Depression   . [facility] spotted fever   . Allergy   . Transient cerebral ischemia   . GERD (gastroesophageal reflux disease)   . Migraines   . Vertigo     every 2-3 months    Patient Active Problem List   Diagnosis Date Noted  . Heartburn   . Hiatal hernia   . Stricture and stenosis of esophagus   . Gastroesophageal reflux disease without esophagitis 07/22/2015  . Urinary tract infection 04/28/2015  . Osteoporosis 01/26/2015  . Iron deficiency anemia 01/26/2015  . Insomnia 01/26/2015  . Allergic rhinitis  01/26/2015  . Anxiety and depression 01/26/2015  . Hypothyroidism 01/26/2015  . Migraine 01/26/2015  . Hypertension 01/26/2015  . Panic disorder 01/26/2015  . DM type 2 (diabetes mellitus, type 2) ([place]) 01/26/2015  . Hyperlipidemia 01/26/2015  . Vertigo 01/26/2015  . CVA (cerebral infarction) 01/26/2015  . Chronic tension-type headache, intractable 10/06/2014  . Dizziness 10/06/2014  . Essential (primary) hypertension 10/06/2014  . Disordered sleep 10/06/2014    Past Surgical History  Procedure Laterality Date  . Abdominal hysterectomy    . Tubal ligation    . Coronary angioplasty    . Esophagogastroduodenoscopy (egd) with propofol N/A 08/24/2015    Procedure: ESOPHAGOGASTRODUODENOSCOPY (EGD) WITH PROPOFOL with dialation;  Surgeon: Lucilla Lame, MD;  Location: [facility];  Service: Endoscopy;  Laterality: N/A;  Diabetic - oral meds    Current Outpatient Rx  Name  Route  Sig  Dispense  Refill  . aspirin 81 MG tablet   Oral   Take 81 mg by mouth daily.         . Dulaglutide (TRULICITY) 1.5 WC/3.7SE SOPN   Subcutaneous   Inject 1.5 mg into the skin once a week.   12 pen   3   . Flaxseed, Linseed, (FLAX SEED OIL PO)   Oral   Take by mouth 2 (two) times daily.         Marland Kitchen JANUVIA 100 MG tablet  Oral   Take 100 mg by mouth daily.       3     Dispense as written.   . metoprolol succinate (TOPROL-XL) 25 MG 24 hr tablet   Oral   Take 12.5 mg by mouth daily.          . Multiple Vitamin (MULTIVITAMIN) tablet   Oral   Take 1 tablet by mouth daily.         Marland Kitchen amitriptyline (ELAVIL) 25 MG tablet   Oral   Take 1 tablet (25 mg total) by mouth at bedtime. PRN sleep   30 tablet   3   . clopidogrel (PLAVIX) 75 MG tablet   Oral   Take 1 tablet (75 mg total) by mouth daily.   90 tablet   3   . escitalopram (LEXAPRO) 20 MG tablet   Oral   Take 1 tablet (20 mg total) by mouth daily.   90 tablet   1   . glipiZIDE (GLUCOTROL XL) 5 MG 24 hr tablet    Oral   Take 1 tablet (5 mg total) by mouth daily with breakfast.   30 tablet   2   . meclizine (ANTIVERT) 25 MG tablet   Oral   Take 1 tablet (25 mg total) by mouth every 6 (six) hours as needed. Reported on 08/13/2015 Patient taking differently: Take 25 mg by mouth every 6 (six) hours as needed for dizziness. Reported on 08/13/2015   30 tablet   1   . metFORMIN (GLUCOPHAGE-XR) 500 MG 24 hr tablet   Oral   Take 1 tablet (500 mg total) by mouth 4 (four) times daily. Patient taking differently: Take 500 mg by mouth. 4 tablets daily   360 tablet   3   . pantoprazole (PROTONIX) 40 MG tablet   Oral   Take 1 tablet (40 mg total) by mouth 2 (two) times daily.   60 tablet   11   . simvastatin (ZOCOR) 40 MG tablet   Oral   Take 1 tablet (40 mg total) by mouth daily at 6 PM.   30 tablet   3   . valsartan-hydrochlorothiazide (DIOVAN-HCT) 320-25 MG tablet   Oral   Take 1 tablet by mouth daily. DC Irbesartan   30 tablet   1     Allergies Prozac  Family History  Problem Relation Age of Onset  . Diabetes Mother   . Hypertension Mother   . Stroke Mother   . Heart disease Mother     MI  . Cancer Father     pancreatic  . Stroke Father   . Hypertension Sister   . Diabetes Brother   . Diabetes Maternal Grandfather   . Diabetes Sister   . Diabetes Sister   . Diabetes Brother   . Diabetes Brother     Social History Social History  Substance Use Topics  . Smoking status: Never Smoker   . Smokeless tobacco: Never Used  . Alcohol Use: No    Review of Systems Constitutional: No fever/chills Eyes: No visual changes. ENT: No sore throat. Cardiovascular: Denies chest pain.Rapid heart rate Respiratory: Denies shortness of breath. Gastrointestinal: Positive for abdominal cramping.  Mild nausea, no vomiting.  Multiple episodes of diarrhea that were profuse watery in nature.  No constipation. Genitourinary: Positive for dysuria, frequency, and suprapubic 18. Musculoskeletal:  Negative for back pain. Skin: Negative for rash. Neurological: Negative for headaches, focal weakness or numbness.  10-point ROS otherwise negative.  ____________________________________________  PHYSICAL EXAM:  VITAL SIGNS: ED Triage Vitals  Enc Vitals Group     BP --      Pulse --      Resp 12/22/15 0452 18     Temp --      Temp Source 12/22/15 0452 Rectal     SpO2 --      Weight 12/22/15 0452 178 lb (80.74 kg)     Height 12/22/15 0452 _0  (1.702 m)     Head Cir --      Peak Flow --      Pain Score 12/22/15 0457 0     Pain Loc --      Pain Edu? --      Excl. in [place]? --     Constitutional: Alert and oriented. Well appearing and in Mild distress secondary to her tachycardia and hypotension. Eyes: Conjunctivae are normal. PERRL. EOMI.  Head: Atraumatic. Nose: No congestion/rhinnorhea. Mouth/Throat: Mucous membranes are .  Oropharynx non-erythematous. Neck: No stridor.   Cardiovascular: Rapid rate, regular rhythm. Grossly normal heart sounds.  Good peripheral circulation. Respiratory: Normal respiratory effort.  No retractions. Lungs CTAB. Gastrointestinal: Soft and mild diffuse periumbilical tenderness but no significant rebound or guarding or peritoneal signs.. No distention. No abdominal bruits. No CVA tenderness. Hemoccult was performed, patient had no masses per the rectum and she was Hemoccult negative. Musculoskeletal: No lower extremity tenderness nor edema.  No joint effusions. Neurologic:  Normal speech and language. No gross focal neurologic deficits are appreciated. No gait instability. Skin:  Skin is warm, dry and intact. No rash noted. Psychiatric: Mood and affect are normal. Speech and behavior are normal.  ____________________________________________   LABS (all labs ordered are listed, but only abnormal results are displayed)  Labs Reviewed  COMPREHENSIVE METABOLIC PANEL - Abnormal; Notable for the following:    Sodium 133 (*)    Potassium 2.5 (*)     Chloride 95 (*)    CO2 20 (*)    Glucose, Bld 393 (*)    BUN 21 (*)    Creatinine, Ser 1.95 (*)    Calcium 8.3 (*)    GFR calc non Af Amer 25 (*)    GFR calc Af Amer 29 (*)    Anion gap 18 (*)    All other components within normal limits  CBC - Abnormal; Notable for the following:    RDW 15.8 (*)    All other components within normal limits  URINALYSIS COMPLETEWITH MICROSCOPIC ([facility] ONLY) - Abnormal; Notable for the following:    Color, Urine AMBER (*)    APPearance HAZY (*)    Glucose, UA 50 (*)    Ketones, ur TRACE (*)    Protein, ur 100 (*)    Bacteria, UA RARE (*)    Squamous Epithelial / LPF 0-5 (*)    All other components within normal limits  C DIFFICILE QUICK SCREEN W PCR REFLEX  GASTROINTESTINAL PANEL BY PCR, STOOL (REPLACES STOOL CULTURE)  LIPASE, BLOOD  TROPONIN I   ____________________________________________  EKG  ED ECG REPORT I, Ruby Cola, the attending physician, personally viewed and interpreted this ECG.   Date: 12/22/2015  EKG Time: 0458  Rate: 121  Rhythm: sinus tachycardia  Axis: normal  Intervals:none  ST&T Change: Nonspecific ST/Twave changes  ____________________________________________  RADIOLOGY  Ct Abdomen Pelvis Wo Contrast  12/22/2015  CLINICAL DATA:  Abdominal pain and diarrhea EXAM: CT ABDOMEN AND PELVIS WITHOUT CONTRAST TECHNIQUE: Multidetector CT imaging of the abdomen and pelvis  was performed following the standard protocol without IV contrast. COMPARISON:  None. FINDINGS: Lower chest and abdominal wall:  No contributory findings. Hepatobiliary: No focal liver abnormality.Rim of pericholecystic edema. No visible calcified gallstone. No bile duct dilatation. Pancreas: Unremarkable. Spleen: Unremarkable. Adrenals/Urinary Tract: Negative adrenals. No hydronephrosis or stone. Unremarkable bladder. Stomach/Bowel:  No obstruction. No appendicitis. Reproductive:Hysterectomy and possible oophorectomies. Vascular/Lymphatic: No acute  vascular abnormality. Prominent number of mesenteric lymph nodes but nonenlarged and likely incidental. Other: No ascites or pneumoperitoneum. Musculoskeletal: No acute abnormalities. Advanced lower lumbar facet arthropathy. IMPRESSION: 1. Pericholecystic fluid. If acute cholecystitis is a clinical possibility, recommend sonography to evaluate for calculi. 2. No other explanation for abdominal pain. Electronically Signed   By: Monte Fantasia M.D.   On: 12/22/2015 06:12   Dg Chest 1 View  12/22/2015  CLINICAL DATA:  Initial evaluation for acute abdominal pain and diarrhea. EXAM: CHEST 1 VIEW COMPARISON:  Air radiograph from 04/17/2012. FINDINGS: The cardiac and mediastinal silhouettes are stable in size and contour, and remain within normal limits. Minimal plaque within the aortic arch. The lungs are hypoinflated. Associated mild bibasilar atelectasis. No focal infiltrates. No pulmonary edema or pleural effusion. No pneumothorax. No acute osseous abnormality identified. IMPRESSION: 1. Shallow lung inflation with mild bibasilar atelectasis. No other active cardiopulmonary disease. 2. Aortic atherosclerosis. Electronically Signed   By: Jeannine Boga M.D.   On: 12/22/2015 05:56   Mr Virgel P[facility] Contrast  12/12/2015   [facility] [address], [place], [place] [address] [phone] NEUROIMAGING REPORT STUDY DATE: 12/11/2015 PATIENT NAME: Krisa Blattner DOB: 01-01-1946 MRN: [id number] EXAM: MR angiogram of the intracranial arteries ORDERING CLINICIAN: Andrey Spearman M.D. CLINICAL HISTORY: 70 year old woman with vertebral basilar transient ischemic attack, dizziness, gait disturbance and slurred speech. COMPARISON FILMS: none TECHNIQUE: MR angiogram of the head was obtained utilizing 3D time of flight sequences from below the vertebrobasilar junction up to the intracranial vasculature without contrast.  Computerized reconstructions were obtained. CONTRAST: None IMAGING SITE:  [facility], [place], [place], [place] FINDINGS: The imaged extracranial and intracranial portions of the internal carotid arteries appear normal. The middle cerebral and anterior cerebral arteries appear normal. In the posterior circulation, the right vertebral artery is tortuous forms the basilar artery. The left vertebral artery terminates as the left posterior inferior cerebellar artery. No stenosis is noted within the vertebral and the basilar arteries. The posterior cerebral arteries appear normal.   Of note, there is a normal variant where the right posterior cerebral artery obtains its flow from the anterior circulation through a patent right posterior communicating artery. No aneurysms were identified.   12/12/2015   This MR angiogram of the intracerebral arteries shows the following: 1.    There does not appear to be any region of stenosis. 2.     No aneurysms are noted. 3.     There is normal variant anatomy with the left vertebral artery terminating as the left posterior inferior cerebellar artery and the right posterior cerebral artery obtaining flow from the anterior circulation through a widely patent right posterior communicating artery. INTERPRETING PHYSICIAN: Richard A. Felecia Shelling, MD, PhD Certified in  [place] by [employer] of Neuroimaging   Mr Angiogram Neck W [facility] Contrast  12/12/2015   [facility] [address] [place], [place] [address] [phone] NEUROIMAGING REPORT STUDY DATE:   12/11/2015 PATIENT NAME: Emilina Smarr DOB: [birth year] MRN: [id number] EXAM:  MR angiogram of the neck arteries with and without contrast  ORDERING CLINICIAN:  Andrey Spearman M.D. CLINICAL HISTORY:  70 year old woman with vertebral basilar TIAs,dizziness, slurred speech and gait disturbance COMPARISON FILMS: none TECHNIQUE: MR angiogram of the neck was obtained utilizing 3D time-of-flight sequences from the aortic arch up to the intracranial vasculature postbolus  contrast infusion.  2D time-of-flight noncontrast views and computerized reconstructions were obtained. CONTRAST:  16 mL MultiHance IMAGING SITE: [facility], [place], [place], [place] FINDINGS: This study is of adequate technical quality.  There is anterograde flow in the bilateral vertebral and carotid arteries on 2D-TOF views.  The flow signal of the left subclavian artery has no stenosis.  The left common, internal and external carotid arteries have no stenosis.  The left vertebral artery has no stenosis from its origin distally.    Of note, the left vertebral artery terminates as the left posterior inferior cerebellar artery.  On the right, the brachiocephalic trunk and subclavian arteries have no stenosis. The right common, internal and external carotid arteries have no stenosis. The right vertebral artery has no stenosis from its origin to the vertebrobasilar junction.   12/12/2015   This is a normal MR angiogram of the neck arteries.   Incidental note is made of a normal variant where the left vertebral artery terminates as the left posterior inferior cerebellar artery. INTERPRETING PHYSICIAN: Richard A. Felecia Shelling, MD, PhD Certified in  [place] by [place] of Neuroimaging   Mr Jeri [facility] Contrast  12/12/2015   [facility] [address], [place], [place] [address] [phone] NEUROIMAGING REPORT STUDY DATE: 12/11/2015 PATIENT NAME: Rebeckah Masih DOB: Oct 29, 1945 MRN: [id number] EXAM: MRI Brain with and without contrast ORDERING CLINICIAN: Andrey Spearman M.D. CLINICAL HISTORY: 70 year old woman with vertebral basilar TIA, dizziness, slurred speech and gait disturbance COMPARISON FILMS: MRI of the brain 09/25/2014 TECHNIQUE:MRI of the brain with and without contrast was obtained utilizing 5 mm axial slices with T1, T2, T2 flair, SWI and diffusion weighted views.  T1 sagittal, T2 coronal and postcontrast views in the axial and coronal plane were  obtained. CONTRAST: 16 ml Multihance IMAGING SITE: [facility], [place]. FINDINGS: On sagittal images, the spinal cord is imaged caudally to C4 and is normal in caliber.   The contents of the posterior fossa are of normal size and position.   The pituitary gland and optic chiasm appear normal.    Brain volume appears normal for age.   The ventricles are normal in size and without distortion.  There are no abnormal extra-axial collections of fluid.  The cerebellum and brainstem appears normal.   The deep gray matter appears normal.  There are a couple small T2/FLAIR hyperintense foci in the left cerebral hemisphere. One left periatrial focus was not seen on the 09/25/2014 MRI.Marland Kitchen  The orbits appear normal.   The VIIth/VIIIth nerve complex appears normal.  The mastoid air cells appear normal.  There is mucoperiosteal thickening a large retention cyst in the right maxillary sinus. Other paranasal sinuses appear normal.  Flow voids are identified within the major intracerebral arteries.   Diffusion weighted images are normal.  Susceptibility weighted images are normal.   After the infusion of contrast material, a normal enhancement pattern is noted.   12/12/2015   This MRI of the brain with and without contrast shows the following: 1.    There are a couple T2/FLAIR hyperintense foci consistent with very minimal chronic microvascular ischemic change. None of the foci appeared to be acute but 1 focus in the  left periatrial white matter was not clearly seen on the prior MRI. 2.    Chronic inflammatory changes noted in the right maxillary sinus. 3.    There are no acute findings. INTERPRETING PHYSICIAN: Richard A. Felecia Shelling, MD, PhD Certified in  [place] by [place] of Neuroimaging    ____________________________________________   PROCEDURES  Procedure(s) performed: None  Procedures  Critical Care performed: No  ____________________________________________   INITIAL IMPRESSION /  ASSESSMENT AND PLAN / ED COURSE  Pertinent labs & imaging results that were available during my care of the patient were reviewed by me and considered in my medical decision making (see chart for details).  6:42 AM Patient will be started on IV fluids and given some pain and nausea medicine while awaiting routine labs and CT of the abdomen and pelvis to evaluate. We also ordered stool for C. difficile as well as routine stool cultures.  6:42 AM Patient's potassium was 2.5. Patient was given some by mouth potassium 40 mEq as well as 10 mEq per IV 2 doses.  Patient's creatinine was also mildly elevated therefore she was given a second liter IV bolus along with start an infusion at 120 from the lesion per hour for her severe dehydration.  6:42 AM Pt was admitted by the hospitalist for further treatment.  Pt's CT was essentially negative. ____________________________________________   FINAL CLINICAL IMPRESSION(S) / ED DIAGNOSES  Final diagnoses:  Acute abdominal pain  Diarrhea, unspecified type  Dehydration  Acute renal insufficiency  Hypokalemia      NEW MEDICATIONS STARTED DURING THIS VISIT:  New Prescriptions   No medications on file     Note:  This document was prepared using Dragon voice recognition software and may include unintentional dictation errors.    Ruby Cola, MD 12/22/15 [place], MD 12/22/15 [phone]

## 2015-12-22 NOTE — ED Notes (Signed)
Pt transported to room via tech

## 2015-12-23 LAB — BASIC METABOLIC PANEL
Anion gap: 9 (ref 5–15)
BUN: 20 mg/dL (ref 6–20)
CO2: 23 mmol/L (ref 22–32)
Calcium: 7.7 mg/dL — ABNORMAL LOW (ref 8.9–10.3)
Chloride: 101 mmol/L (ref 101–111)
Creatinine, Ser: 0.96 mg/dL (ref 0.44–1.00)
GFR, EST NON AFRICAN AMERICAN: 59 mL/min — AB (ref >60)
Glucose, Bld: 236 mg/dL — ABNORMAL HIGH (ref 65–99)
POTASSIUM: 3.2 mmol/L — AB (ref 3.5–5.1)
SODIUM: 133 mmol/L — AB (ref 135–145)

## 2015-12-23 LAB — CBC
HEMATOCRIT: 37.3 % (ref 35.0–47.0)
Hemoglobin: 12.6 g/dL (ref 12.0–16.0)
MCH: 28 pg (ref 26.0–34.0)
MCHC: 33.9 g/dL (ref 32.0–36.0)
MCV: 82.6 fL (ref 80.0–100.0)
PLATELETS: 281 10*3/uL (ref 150–440)
RBC: 4.52 MIL/uL (ref 3.80–5.20)
RDW: 16.6 % — AB (ref 11.5–14.5)
WBC: 7.7 10*3/uL (ref 3.6–11.0)

## 2015-12-23 LAB — GLUCOSE, CAPILLARY
GLUCOSE-CAPILLARY: 237 mg/dL — AB (ref 65–99)
GLUCOSE-CAPILLARY: 262 mg/dL — AB (ref 65–99)
GLUCOSE-CAPILLARY: 244 mg/dL — AB (ref 65–99)
Glucose-Capillary: 244 mg/dL — ABNORMAL HIGH (ref 65–99)

## 2015-12-23 LAB — MAGNESIUM: Magnesium: 2.4 mg/dL (ref 1.7–2.4)

## 2015-12-23 MED ORDER — LOPERAMIDE HCL 2 MG PO CAPS
2.0000 mg | ORAL_CAPSULE | ORAL | Status: DC | PRN
  Administered 2015-12-23 – 2015-12-24 (×5): 2 mg via ORAL
  Filled 2015-12-23 (×5): qty 1

## 2015-12-23 MED ORDER — INSULIN GLARGINE 100 UNIT/ML ~~LOC~~ SOLN
16.0000 [IU] | Freq: Every day | SUBCUTANEOUS | Status: DC
  Administered 2015-12-23: 16 [IU] via SUBCUTANEOUS
  Filled 2015-12-23 (×2): qty 0.16

## 2015-12-23 MED ORDER — POTASSIUM CHLORIDE CRYS ER 20 MEQ PO TBCR
40.0000 meq | EXTENDED_RELEASE_TABLET | Freq: Once | ORAL | Status: AC
  Administered 2015-12-23: 40 meq via ORAL
  Filled 2015-12-23: qty 2

## 2015-12-23 MED ORDER — SODIUM CHLORIDE 0.9 % IV SOLN: Freq: Once | INTRAVENOUS | Status: DC

## 2015-12-23 NOTE — Progress Notes (Signed)
[place] [facility] [place] NAME: Jamie Murray    MR#:  [id number]  DATE OF BIRTH:  November 13, 1945  SUBJECTIVE:  CHIEF COMPLAINT:   Chief Complaint  Patient presents with  . Diarrhea  . Abdominal Cramping   Multiple watery diarrhea REVIEW OF SYSTEMS:  Review of Systems  Constitutional: Negative for fever and chills.  HENT: Negative.   Eyes: Negative.   Respiratory: Negative for cough, hemoptysis, sputum production, shortness of breath and wheezing.   Cardiovascular: Negative for chest pain, palpitations and leg swelling.  Gastrointestinal: Positive for diarrhea. Negative for nausea, vomiting, abdominal pain, blood in stool and melena.  Genitourinary: Negative for dysuria, urgency and frequency.  Musculoskeletal: Negative for joint pain.  Skin: Negative.   Neurological: Negative for dizziness and weakness.  Psychiatric/Behavioral: Negative for depression. The patient is not nervous/anxious.     DRUG ALLERGIES:   Allergies  Allergen Reactions  . Prozac [Fluoxetine Hcl] Other (See Comments)    "felt crazy"   VITALS:  Blood pressure 127/72, pulse 113, temperature 97.7 F (36.5 C), temperature source Oral, resp. rate 17, height 5' 7" (1.702 m), weight 178 lb (80.74 kg), SpO2 100 %. PHYSICAL EXAMINATION:  Physical Exam  Constitutional: She is oriented to person, place, and time and well-developed, well-nourished, and in no distress.  HENT:  Head: Normocephalic.  Mouth/Throat: Oropharynx is clear and moist.  Eyes: Conjunctivae and EOM are normal. Pupils are equal, round, and reactive to light.  Neck: Neck supple. No JVD present. No thyromegaly present.  Cardiovascular: Normal rate, regular rhythm and normal heart sounds.   No murmur heard. Pulmonary/Chest: Effort normal and breath sounds normal. No respiratory distress. She has no wheezes. She has no rales.  Abdominal: Soft. Bowel sounds are normal. She exhibits no distension. There is no  tenderness.  Musculoskeletal: Normal range of motion. She exhibits no edema or tenderness.  Neurological: She is alert and oriented to person, place, and time. No cranial nerve deficit.  Skin: No rash noted. No erythema.  Psychiatric: Mood, memory, affect and judgment normal.   LABORATORY PANEL:   CBC  Recent Labs Lab 12/23/15 0513  WBC 7.7  HGB 12.6  HCT 37.3  PLT 281   ------------------------------------------------------------------------------------------------------------------ Chemistries   Recent Labs Lab 12/22/15 0500 12/23/15 0513  NA 133* 133*  K 2.5* 3.2*  CL 95* 101  CO2 20* 23  GLUCOSE 393* 236*  BUN 21* 20  CREATININE 1.95* 0.96  CALCIUM 8.3* 7.7*  MG 1.3* 2.4  AST 25  --   ALT 16  --   ALKPHOS 102  --   BILITOT 0.4  --    RADIOLOGY:  No results found. ASSESSMENT AND PLAN:   Acute diarrhea. Treated with IVF support. Negative stool test.  ARF and hyponatremia.  hold diovan-HCTZ,  Improved with NS iv.  Hypokalemia. Given KC iv and po, improving. Hypomagnesemia. Given mag iv, improved. Tachycardia due to above.   DM2. Hold po medication, on sliding scale. Add lantus 16 units HS.  HTN. Hold HTN medication due to low BP.  All the records are reviewed and case discussed with Care Management/Social Worker. Management plans discussed with the patient, family and they are in agreement.  CODE STATUS: full code.  TOTAL TIME TAKING CARE OF THIS PATIENT: 33 minutes.   More than 50% of the time was spent in counseling/coordination of care: YES  POSSIBLE D/C IN 1-2 DAYS, DEPENDING ON CLINICAL CONDITION.   Demetrios Loll M.D on  12/23/2015 at 2:50 PM  Between 7am to 6pm - Pager - [phone]  After 6pm go to [email or web address] - Proofreader  [facility]  Office  [phone]  CC: Primary care physician; Kathrine Haddock, NP  Note: This dictation was prepared with Dragon dictation along with smaller phrase technology.  Any transcriptional errors that result from this process are unintentional.

## 2015-12-23 NOTE — Progress Notes (Signed)
Inpatient Diabetes Program Recommendations  AACE/ADA: New Consensus Statement on Inpatient Glycemic Control (2015)  Target Ranges:  Prepandial:   less than 140 mg/dL      Peak postprandial:   less than 180 mg/dL (1-2 hours)      Critically ill patients:  140 - 180 mg/dL     Review of Glycemic ControlResults for YASMEN, CORTNER (MRN [id number]) as of 12/23/2015 12:07  Ref. Range 12/22/2015 11:22 12/22/2015 15:55 12/22/2015 21:00 12/23/2015 07:58 12/23/2015 11:27  Glucose-Capillary Latest Ref Range: 65-99 mg/dL 263 (H) 196 (H) 192 (H) 237 (H) 262 (H)   Diabetes history: Type 2 diabetes- last A1C per primary MD's note was 7.7% Outpatient Diabetes medications: Truliciity 1.5 mg weekly, Januvia 100 mg daily, Glipizide XL 5 mg daily, Metformin 500 mg 4 times daily Current orders for Inpatient glycemic control:  Novolog sensitive tid with meals and HS  Inpatient Diabetes Program Recommendations:   Note that diabetes oral agents and GLP-1 on hold during hospitalization. Trulicity and Metformin can both have side effects of diarrhea/GI upset therefore may want to avoid until patient condition improves.  May consider adding Lantus 16 units daily while in the hospital.  Will need follow-up with PCP.   Thanks, Adah Perl, RN, BC-ADM Inpatient Diabetes Coordinator Pager [phone] (8a-5p)

## 2015-12-24 LAB — BASIC METABOLIC PANEL
Anion gap: 8 (ref 5–15)
BUN: 21 mg/dL — AB (ref 6–20)
CHLORIDE: 99 mmol/L — AB (ref 101–111)
CO2: 23 mmol/L (ref 22–32)
Calcium: 7.5 mg/dL — ABNORMAL LOW (ref 8.9–10.3)
Creatinine, Ser: 0.9 mg/dL (ref 0.44–1.00)
GFR calc Af Amer: >60 mL/min (ref >60)
GFR calc non Af Amer: >60 mL/min (ref >60)
GLUCOSE: 248 mg/dL — AB (ref 65–99)
POTASSIUM: 3.6 mmol/L (ref 3.5–5.1)
SODIUM: 130 mmol/L — AB (ref 135–145)

## 2015-12-24 LAB — GLUCOSE, CAPILLARY: GLUCOSE-CAPILLARY: 252 mg/dL — AB (ref 65–99)

## 2015-12-24 NOTE — Progress Notes (Signed)
Inpatient Diabetes Program Recommendations  AACE/ADA: New Consensus Statement on Inpatient Glycemic Control (2015)  Target Ranges:  Prepandial:   less than 140 mg/dL      Peak postprandial:   less than 180 mg/dL (1-2 hours)      Critically ill patients:  140 - 180 mg/dL   Lab Results  Component Value Date   GLUCAP 252* 12/24/2015   HGBA1C 6.5* 04/18/2012    Review of Glycemic Control  Results for WARDA, MCQUEARY (MRN [id number]) as of 12/24/2015 08:54  Ref. Range 12/23/2015 07:58 12/23/2015 11:27 12/23/2015 16:23 12/23/2015 21:21 12/24/2015 07:31  Glucose-Capillary Latest Ref Range: 65-99 mg/dL 237 (H) 262 (H) 244 (H) 244 (H) 252 (H)    Diabetes history: Type 2 diabetes- last A1C per primary MD's note was 7.7% Outpatient Diabetes medications: Truliciity 1.5 mg weekly, Januvia 100 mg daily, Glipizide XL 5 mg daily, Metformin 500 mg 4 times daily  Current orders for Inpatient glycemic control: Novolog sensitive tid with meals and HS, Lantus 16 units qhs  Inpatient Diabetes Program Recommendations:  Note that diabetes oral agents and GLP-1 on hold during hospitalization. Trulicity and Metformin can both have side effects of diarrhea/GI upset therefore may want to avoid until patient condition improves.  Will need follow-up with PCP.   Fasting blood sugar 270m/dl despite Lantus 16 units last night- consider increasing Lantus to 20 units qhs.   Post prandial blood sugars elevated despite current dose of Novolog correction- consider adding Novolog 3 units tid with meals.   JGentry Fitz RN, BA, MHA, CDE Diabetes Coordinator Inpatient Diabetes Program  [phone](Team Pager) [phone]([place] 12/24/2015 8:57 AM

## 2015-12-24 NOTE — Progress Notes (Signed)
Pt A and O x 4. VSS. Pt tolerating diet well. No complaints of pain or nausea. IV removed intact. Pt voiced understanding of discharge instructions with no further questions. Pt discharged via wheelchair with axillary.

## 2015-12-24 NOTE — Discharge Summary (Addendum)
[facility] [place] NAME: Jamie Murray    MR#:  [id number]  DATE OF BIRTH:  09/05/1945  DATE OF ADMISSION:  12/22/2015 ADMITTING PHYSICIAN: Demetrios Loll, MD  DATE OF DISCHARGE: 12/24/2015 10:52 AM  PRIMARY CARE PHYSICIAN: Kathrine Haddock, NP    ADMISSION DIAGNOSIS:  Dehydration [E86.0] Hypokalemia [E87.6] Acute abdominal pain [R10.0] Acute renal insufficiency [N28.9] Diarrhea, unspecified type [R19.7]  DISCHARGE DIAGNOSIS:  Principal Problem:   ARF (acute renal failure) (HCC) Active Problems:   Hypokalemia Acute diarrhea.  SECONDARY DIAGNOSIS:   Past Medical History  Diagnosis Date  . Diabetes ([place])   . Anxiety   . Osteoporosis   . Anemia   . Hypertension   . Hyperlipidemia   . Insomnia   . Depression   . [facility] spotted fever   . Allergy   . Transient cerebral ischemia   . GERD (gastroesophageal reflux disease)   . Migraines   . Vertigo     every 2-3 months    HOSPITAL COURSE:   Acute diarrhea. Resolved. Treated with IVF support. Negative stool test.  ARF and hyponatremia.  hold diovan-HCTZ, Improved with NS iv.  Hypokalemia. Given KC iv and po, improved. Hypomagnesemia. Given mag iv, improved. Tachycardia due to above. Improved.  DM2. Hold po medication, on sliding scale and  lantus 16 units HS. Resume po meds after discharge.  HTN. Hold HTN medication due to low BP. Resume after discharge.  DISCHARGE CONDITIONS:   Stable, discharged to home today.  CONSULTS OBTAINED:     DRUG ALLERGIES:   Allergies  Allergen Reactions  . Prozac [Fluoxetine Hcl] Other (See Comments)    "felt crazy"    DISCHARGE MEDICATIONS:   Discharge Medication List as of 12/24/2015  9:50 AM    CONTINUE these medications which have NOT CHANGED   Details  aspirin 81 MG tablet Take 81 mg by mouth daily., Until Discontinued, Historical Med    Dulaglutide (TRULICITY) 1.5 LT/9.0ZE SOPN Inject 1.5 mg into the  skin once a week., Starting 02/06/2015, Until Discontinued, Normal    Flaxseed, Linseed, (FLAX SEED OIL PO) Take by mouth 2 (two) times daily., Until Discontinued, Historical Med    JANUVIA 100 MG tablet Take 100 mg by mouth daily. , Starting 11/16/2015, Until Discontinued, Historical Med    metoprolol succinate (TOPROL-XL) 25 MG 24 hr tablet Take 12.5 mg by mouth daily. , Starting 04/15/2014, Until Discontinued, Historical Med    Multiple Vitamin (MULTIVITAMIN) tablet Take 1 tablet by mouth daily., Until Discontinued, Historical Med    amitriptyline (ELAVIL) 25 MG tablet Take 1 tablet (25 mg total) by mouth at bedtime. PRN sleep, Starting 12/22/2015, Until Discontinued, Normal    clopidogrel (PLAVIX) 75 MG tablet Take 1 tablet (75 mg total) by mouth daily., Starting 10/02/2015, Until Discontinued, Normal    escitalopram (LEXAPRO) 20 MG tablet Take 1 tablet (20 mg total) by mouth daily., Starting 05/18/2015, Until Discontinued, Normal    glipiZIDE (GLUCOTROL XL) 5 MG 24 hr tablet Take 1 tablet (5 mg total) by mouth daily with breakfast., Starting 11/20/2015, Until Discontinued, Normal    meclizine (ANTIVERT) 25 MG tablet Take 1 tablet (25 mg total) by mouth every 6 (six) hours as needed. Reported on 08/13/2015, Starting 09/21/2015, Until Discontinued, Normal    metFORMIN (GLUCOPHAGE-XR) 500 MG 24 hr tablet Take 1 tablet (500 mg total) by mouth 4 (four) times daily., Starting 02/02/2015, Until Discontinued, Normal    pantoprazole (PROTONIX) 40 MG  tablet Take 1 tablet (40 mg total) by mouth 2 (two) times daily., Starting 08/24/2015, Until Discontinued, Normal    simvastatin (ZOCOR) 40 MG tablet Take 1 tablet (40 mg total) by mouth daily at 6 PM., Starting 08/24/2015, Until Discontinued, Normal    valsartan-hydrochlorothiazide (DIOVAN-HCT) 320-25 MG tablet Take 1 tablet by mouth daily. DC Irbesartan, Starting 11/18/2015, Until Discontinued, Normal         DISCHARGE INSTRUCTIONS:    DIET:  Heart  healthy and ADA diet.  DISCHARGE CONDITION:  Stable  ACTIVITY:  As tolerated.  DISCHARGE LOCATION:    If you experience worsening of your admission symptoms, develop shortness of breath, life threatening emergency, suicidal or homicidal thoughts you must seek medical attention immediately by calling 911 or calling your MD immediately  if symptoms less severe.  You Must read complete instructions/literature along with all the possible adverse reactions/side effects for all the Medicines you take and that have been prescribed to you. Take any new Medicines after you have completely understood and accpet all the possible adverse reactions/side effects.   Please note  You were cared for by a hospitalist during your hospital stay. If you have any questions about your discharge medications or the care you received while you were in the hospital after you are discharged, you can call the unit and asked to speak with the hospitalist on call if the hospitalist that took care of you is not available. Once you are discharged, your primary care physician will handle any further medical issues. Please note that NO REFILLS for any discharge medications will be authorized once you are discharged, as it is imperative that you return to your primary care physician (or establish a relationship with a primary care physician if you do not have one) for your aftercare needs so that they can reassess your need for medications and monitor your lab values.    On the day of Discharge:  VITAL SIGNS:  Blood pressure 116/59, pulse 89, temperature 97.9 F (36.6 C), temperature source Oral, resp. rate 17, height _0  (1.702 m), weight 178 lb (80.74 kg), SpO2 98 %.  PHYSICAL EXAMINATION:  GENERAL:  70 y.o.-year-old patient lying in the bed with no acute distress.  EYES: Pupils equal, round, reactive to light and accommodation. No scleral icterus. Extraocular muscles intact.  HEENT: Head atraumatic, normocephalic.  Oropharynx and nasopharynx clear.  NECK:  Supple, no jugular venous distention. No thyroid enlargement, no tenderness.  LUNGS: Normal breath sounds bilaterally, no wheezing, rales,rhonchi or crepitation. No use of accessory muscles of respiration.  CARDIOVASCULAR: S1, S2 normal. No murmurs, rubs, or gallops.  ABDOMEN: Soft, non-tender, non-distended. Bowel sounds present. No organomegaly or mass.  EXTREMITIES: No pedal edema, cyanosis, or clubbing.  NEUROLOGIC: Cranial nerves II through XII are intact. Muscle strength 5/5 in all extremities. Sensation intact. Gait not checked.  PSYCHIATRIC: The patient is alert and oriented x 3.  SKIN: No obvious rash, lesion, or ulcer.  DATA REVIEW:   CBC  Recent Labs Lab 12/23/15 0513  WBC 7.7  HGB 12.6  HCT 37.3  PLT 281    Chemistries   Recent Labs Lab 12/22/15 0500 12/23/15 0513 12/24/15 0805  NA 133* 133* 130*  K 2.5* 3.2* 3.6  CL 95* 101 99*  CO2 20* 23 23  GLUCOSE 393* 236* 248*  BUN 21* 20 21*  CREATININE 1.95* 0.96 0.90  CALCIUM 8.3* 7.7* 7.5*  MG 1.3* 2.4  --   AST 25  --   --  ALT 16  --   --   ALKPHOS 102  --   --   BILITOT 0.4  --   --     Cardiac Enzymes  Recent Labs Lab 12/22/15 0500  TROPONINI <0.03    Microbiology Results  Results for orders placed or performed during the hospital encounter of 12/22/15  C difficile quick scan w PCR reflex     Status: None   Collection Time: 12/22/15 10:16 AM  Result Value Ref Range Status   C Diff antigen NEGATIVE NEGATIVE Final   C Diff toxin NEGATIVE NEGATIVE Final   C Diff interpretation No C. difficile detected.  Final  Gastrointestinal Panel by PCR , Stool     Status: None   Collection Time: 12/22/15 10:16 AM  Result Value Ref Range Status   Campylobacter species NOT DETECTED NOT DETECTED Final   Plesimonas shigelloides NOT DETECTED NOT DETECTED Final   Salmonella species NOT DETECTED NOT DETECTED Final   Yersinia enterocolitica NOT DETECTED NOT DETECTED Final     Vibrio species NOT DETECTED NOT DETECTED Final   Vibrio cholerae NOT DETECTED NOT DETECTED Final   Enteroaggregative E coli (EAEC) NOT DETECTED NOT DETECTED Final   Enteropathogenic E coli (EPEC) NOT DETECTED NOT DETECTED Final   Enterotoxigenic E coli (ETEC) NOT DETECTED NOT DETECTED Final   Shiga like toxin producing E coli (STEC) NOT DETECTED NOT DETECTED Final   E. coli O157 NOT DETECTED NOT DETECTED Final   Shigella/Enteroinvasive E coli (EIEC) NOT DETECTED NOT DETECTED Final   Cryptosporidium NOT DETECTED NOT DETECTED Final   Cyclospora cayetanensis NOT DETECTED NOT DETECTED Final   Entamoeba histolytica NOT DETECTED NOT DETECTED Final   Giardia lamblia NOT DETECTED NOT DETECTED Final   Adenovirus F40/41 NOT DETECTED NOT DETECTED Final   Astrovirus NOT DETECTED NOT DETECTED Final   Norovirus GI/GII NOT DETECTED NOT DETECTED Final   Rotavirus A NOT DETECTED NOT DETECTED Final   Sapovirus (I, II, IV, and V) NOT DETECTED NOT DETECTED Final    RADIOLOGY:  No results found.   Management plans discussed with the patient, family and they are in agreement.  CODE STATUS:  Code Status History    Date Active Date Inactive Code Status Order ID Comments User Context   12/22/2015  8:16 AM 12/24/2015  1:52 PM Full Code [id number]  Demetrios Loll, MD Inpatient      TOTAL TIME TAKING CARE OF THIS PATIENT: 33 minutes.    Demetrios Loll M.D on 12/24/2015 at 2:53 PM  Between 7am to 6pm - Pager - [phone]  After 6pm go to [email or web address] - password [email or web address] [facility]  Office  [phone]  CC: Primary care physician; Kathrine Haddock, NP   Note: This dictation was prepared with Dragon dictation along with smaller phrase technology. Any transcriptional errors that result from this process are unintentional.

## 2015-12-24 NOTE — Discharge Instructions (Signed)
Heart health and ADA diet. Activity as tolerated.

## 2015-12-28 ENCOUNTER — Telehealth: Payer: Self-pay

## 2015-12-28 ENCOUNTER — Ambulatory Visit (INDEPENDENT_AMBULATORY_CARE_PROVIDER_SITE_OTHER): Payer: Medicare Other | Admitting: Family Medicine

## 2015-12-28 ENCOUNTER — Encounter: Payer: Self-pay | Admitting: Family Medicine

## 2015-12-28 VITALS — BP 120/79 | HR 72 | Temp 97.4°F | Wt 175.0 lb

## 2015-12-28 DIAGNOSIS — R197 Diarrhea, unspecified: Secondary | ICD-10-CM

## 2015-12-28 LAB — CBC WITH DIFFERENTIAL/PLATELET
HEMATOCRIT: 33 % — AB (ref 34.0–46.6)
HEMOGLOBIN: 11.5 g/dL (ref 11.1–15.9)
Lymphocytes Absolute: 1.9 10*3/uL (ref 0.7–3.1)
Lymphs: 29 %
MCH: 29.1 pg (ref 26.6–33.0)
MCHC: 34.8 g/dL (ref 31.5–35.7)
MCV: 84 fL (ref 79–97)
MID (Absolute): 2 10*3/uL — ABNORMAL HIGH (ref 0.1–1.6)
MID: 30 %
NEUTROS ABS: 2.8 10*3/uL (ref 1.4–7.0)
NEUTROS PCT: 41 %
Platelets: 180 10*3/uL (ref 150–379)
RBC: 3.95 x10E6/uL (ref 3.77–5.28)
RDW: 15.8 % — ABNORMAL HIGH (ref 12.3–15.4)
WBC: 6.7 10*3/uL (ref 3.4–10.8)

## 2015-12-28 MED ORDER — FAMOTIDINE 20 MG PO TABS: 20.0000 mg | ORAL_TABLET | Freq: Two times a day (BID) | ORAL | 3 refills | Status: DC

## 2015-12-28 NOTE — Telephone Encounter (Signed)
It certainly wouldn't harm anything if she did take one.

## 2015-12-28 NOTE — Patient Instructions (Signed)
Follow up if no improvement in the next week

## 2015-12-28 NOTE — Telephone Encounter (Signed)
Patient notified.

## 2015-12-28 NOTE — Progress Notes (Signed)
BP 120/79   Pulse 72   Temp 97.4 F (36.3 C)   Wt 175 lb (79.4 kg)   LMP  (LMP Unknown)   BMI 27.41 kg/m    Subjective:    Patient ID: Jamie Murray, female    DOB: 10-Oct-1945, 70 y.o.   MRN: [id number]  HPI: Jamie Murray is a 70 y.o. female  Chief Complaint  Patient presents with  . Diarrhea    Since last Monday. Was in the hospital last week. She states it's always worse at night. Wants to know what could be causing it, ecoli, etc. Should she take Metamucil?  . Other    Wants to know if she can get a B12 injection or have her levels checked.   Patient presents with persistent diarrhea following d/c from hospital last week. Stool cultures and GI panel in hospital all negative. CT abdomen pelvis negative. Given IV fluids and repletion of electrolytes with resolution of acute renal failure. States she feels fine during the day, but is having diarrhea and abdominal pain in the night that is waking her up several times a night. Taking imodium with no relief. Cramping lower abdominal pain right before having to go to the bathroom. Resolves after BM. Denies urinary symptoms.    Relevant past medical, surgical, family and social history reviewed and updated as indicated. Interim medical history since our last visit reviewed. Allergies and medications reviewed and updated.  Review of Systems  Constitutional: Negative for chills and fever.  HENT: Negative.   Respiratory: Negative.   Cardiovascular: Negative.   Gastrointestinal: Positive for abdominal pain and diarrhea.       Reflux  Genitourinary: Negative for dysuria, flank pain, frequency, hematuria and urgency.  Musculoskeletal: Negative.   Neurological: Negative.   Psychiatric/Behavioral: Negative.     Per HPI unless specifically indicated above     Objective:    BP 120/79   Pulse 72   Temp 97.4 F (36.3 C)   Wt 175 lb (79.4 kg)   LMP  (LMP Unknown)   BMI 27.41 kg/m   Wt Readings from Last 3  Encounters:  12/28/15 175 lb (79.4 kg)  12/22/15 178 lb (80.7 kg)  12/18/15 178 lb 3.2 oz (80.8 kg)    Physical Exam  Constitutional: She is oriented to person, place, and time. She appears well-developed and well-nourished. No distress.  HENT:  Head: Atraumatic.  Eyes: Conjunctivae are normal. No scleral icterus.  Neck: Normal range of motion. Neck supple.  Cardiovascular: Normal rate and regular rhythm.   Pulmonary/Chest: Effort normal. No respiratory distress.  Abdominal: Soft. Bowel sounds are normal. She exhibits no distension. There is no tenderness. There is no rebound and no guarding.  Musculoskeletal: Normal range of motion.  Neurological: She is alert and oriented to person, place, and time.  Skin: Skin is warm and dry.  Psychiatric: She has a normal mood and affect. Her behavior is normal.  Nursing note and vitals reviewed.       Assessment & Plan:   Problem List Items Addressed This Visit    None    Visit Diagnoses    Diarrhea, unspecified type    -  Primary   Relevant Orders   CBC With Differential/Platelet   Comprehensive metabolic panel    CBC and exam reassuring today. If no resolution of symptoms within the next week on imodium and pedialyte, will send her to GI for consult. CMP ordered, await results.  Also recommended she start pepcid  for GERD symptoms that seem to be flared right now.    Follow up plan: No Follow-up on file.

## 2015-12-28 NOTE — Telephone Encounter (Signed)
Patient's daughter wants to know if she should take a probiotic or Align if that would help.

## 2015-12-29 ENCOUNTER — Telehealth: Payer: Self-pay | Admitting: Family Medicine

## 2015-12-29 ENCOUNTER — Other Ambulatory Visit: Payer: Self-pay | Admitting: Family Medicine

## 2015-12-29 DIAGNOSIS — R748 Abnormal levels of other serum enzymes: Secondary | ICD-10-CM

## 2015-12-29 LAB — COMPREHENSIVE METABOLIC PANEL
A/G RATIO: 1.5 (ref 1.2–2.2)
ALBUMIN: 3.3 g/dL — AB (ref 3.6–4.8)
ALT: 39 IU/L — AB (ref 0–32)
AST: 31 IU/L (ref 0–40)
Alkaline Phosphatase: 90 IU/L (ref 39–117)
BUN / CREAT RATIO: 11 — AB (ref 12–28)
BUN: 9 mg/dL (ref 8–27)
CALCIUM: 9.6 mg/dL (ref 8.7–10.3)
CO2: 22 mmol/L (ref 18–29)
Chloride: 98 mmol/L (ref 96–106)
Creatinine, Ser: 0.79 mg/dL (ref 0.57–1.00)
GFR, EST AFRICAN AMERICAN: 88 mL/min/{1.73_m2} (ref >59)
GFR, EST NON AFRICAN AMERICAN: 77 mL/min/{1.73_m2} (ref >59)
Globulin, Total: 2.2 g/dL (ref 1.5–4.5)
Glucose: 220 mg/dL — ABNORMAL HIGH (ref 65–99)
POTASSIUM: 3.6 mmol/L (ref 3.5–5.2)
Sodium: 137 mmol/L (ref 134–144)
TOTAL PROTEIN: 5.5 g/dL — AB (ref 6.0–8.5)

## 2015-12-29 NOTE — Telephone Encounter (Signed)
Patient notified.

## 2015-12-29 NOTE — Telephone Encounter (Signed)
Please call pt and let her know that her electrolytes have normalized, but her liver enzymes were very mildly elevated. This could be from recent tylenol or alcohol use, nothing to worry about but I would like her to come back in a week or two to recheck and to avoid taking tylenol or drinking alcohol until then. I will put the lab in.

## 2016-01-20 ENCOUNTER — Emergency Department
Admission: EM | Admit: 2016-01-20 | Discharge: 2016-01-21 | Disposition: A | Discharge status: Home / Self Care | Payer: Medicare Other | Attending: Emergency Medicine | Admitting: Emergency Medicine

## 2016-01-20 DIAGNOSIS — E119 Type 2 diabetes mellitus without complications: Secondary | ICD-10-CM | POA: Insufficient documentation

## 2016-01-20 DIAGNOSIS — E039 Hypothyroidism, unspecified: Secondary | ICD-10-CM | POA: Diagnosis not present

## 2016-01-20 DIAGNOSIS — I10 Essential (primary) hypertension: Secondary | ICD-10-CM | POA: Insufficient documentation

## 2016-01-20 DIAGNOSIS — Z7984 Long term (current) use of oral hypoglycemic drugs: Secondary | ICD-10-CM | POA: Insufficient documentation

## 2016-01-20 DIAGNOSIS — R109 Unspecified abdominal pain: Secondary | ICD-10-CM | POA: Diagnosis not present

## 2016-01-20 DIAGNOSIS — R197 Diarrhea, unspecified: Secondary | ICD-10-CM | POA: Diagnosis present

## 2016-01-20 DIAGNOSIS — Z79899 Other long term (current) drug therapy: Secondary | ICD-10-CM | POA: Insufficient documentation

## 2016-01-20 DIAGNOSIS — Z7982 Long term (current) use of aspirin: Secondary | ICD-10-CM | POA: Insufficient documentation

## 2016-01-20 LAB — CBC
HCT: 37 % (ref 35.0–47.0)
Hemoglobin: 11.8 g/dL — ABNORMAL LOW (ref 12.0–16.0)
MCH: 27.7 pg (ref 26.0–34.0)
MCHC: 32 g/dL (ref 32.0–36.0)
MCV: 86.3 fL (ref 80.0–100.0)
PLATELETS: 331 10*3/uL (ref 150–440)
RBC: 4.28 MIL/uL (ref 3.80–5.20)
RDW: 17.3 % — AB (ref 11.5–14.5)
WBC: 8.2 10*3/uL (ref 3.6–11.0)

## 2016-01-20 LAB — C DIFFICILE QUICK SCREEN W PCR REFLEX
C DIFFICILE (CDIFF) INTERP: NOT DETECTED
C Diff antigen: NEGATIVE
C Diff toxin: NEGATIVE

## 2016-01-20 LAB — COMPREHENSIVE METABOLIC PANEL
ALBUMIN: 4.2 g/dL (ref 3.5–5.0)
ALK PHOS: 67 U/L (ref 38–126)
ALT: 21 U/L (ref 14–54)
AST: 26 U/L (ref 15–41)
Anion gap: 12 (ref 5–15)
BUN: 16 mg/dL (ref 6–20)
CALCIUM: 9.5 mg/dL (ref 8.9–10.3)
CO2: 26 mmol/L (ref 22–32)
CREATININE: 0.8 mg/dL (ref 0.44–1.00)
Chloride: 97 mmol/L — ABNORMAL LOW (ref 101–111)
GFR calc Af Amer: >60 mL/min (ref >60)
GFR calc non Af Amer: >60 mL/min (ref >60)
GLUCOSE: 189 mg/dL — AB (ref 65–99)
Potassium: 3.5 mmol/L (ref 3.5–5.1)
SODIUM: 135 mmol/L (ref 135–145)
Total Bilirubin: 0.7 mg/dL (ref 0.3–1.2)
Total Protein: 7.9 g/dL (ref 6.5–8.1)

## 2016-01-20 LAB — LIPASE, BLOOD: Lipase: 22 U/L (ref 11–51)

## 2016-01-20 MED ORDER — SODIUM CHLORIDE 0.9 % IV BOLUS (SEPSIS)
1000.0000 mL | Freq: Once | INTRAVENOUS | Status: AC
  Administered 2016-01-20: 1000 mL via INTRAVENOUS

## 2016-01-20 MED ORDER — DIATRIZOATE MEGLUMINE & SODIUM 66-10 % PO SOLN
15.0000 mL | Freq: Once | ORAL | Status: AC
  Administered 2016-01-20: 15 mL via ORAL

## 2016-01-20 MED ORDER — DICYCLOMINE HCL 10 MG PO CAPS
10.0000 mg | ORAL_CAPSULE | Freq: Once | ORAL | Status: AC
  Administered 2016-01-20: 10 mg via ORAL
  Filled 2016-01-20: qty 1

## 2016-01-20 NOTE — ED Provider Notes (Signed)
[facility] Emergency Department Provider Note  ____________________________________________   First MD Initiated Contact with Patient 01/20/16 2322     (approximate)  I have reviewed the triage vital signs and the nursing notes.   HISTORY  Chief Complaint Diarrhea    HPI Jamie Murray is a 70 y.o. female with history of recent diarrheal illness 1 month ago with no clear etiology identified returns to the emergency department with history of return of diarrhea today. Patient states that she's had approximately 10-15 episodes of nonbloody diarrhea today. Patient admits to generalized abdominal cramping. Patient states that she did not have this discomfort with the first episode of diarrhea however she describes it as intense. Patient denies any abdominal pain at this time stating her last bowel movement was approximately 20 minutes ago. Patient denies any fever afebrile on presentation temp of 97.4.   Past Medical History:  Diagnosis Date  . Allergy   . Anemia   . Anxiety   . Depression   . Diabetes ([place])   . GERD (gastroesophageal reflux disease)   . Hyperlipidemia   . Hypertension   . Insomnia   . Migraines   . Osteoporosis   . [facility] spotted fever   . Transient cerebral ischemia   . Vertigo    every 2-3 months    Patient Active Problem List   Diagnosis Date Noted  . ARF (acute renal failure) ([place]) 12/22/2015  . Hypokalemia 12/22/2015  . Heartburn   . Hiatal hernia   . Stricture and stenosis of esophagus   . Gastroesophageal reflux disease without esophagitis 07/22/2015  . Urinary tract infection 04/28/2015  . Osteoporosis 01/26/2015  . Iron deficiency anemia 01/26/2015  . Insomnia 01/26/2015  . Allergic rhinitis 01/26/2015  . Anxiety and depression 01/26/2015  . Hypothyroidism 01/26/2015  . Migraine 01/26/2015  . Hypertension 01/26/2015  . Panic disorder 01/26/2015  . DM type 2 (diabetes mellitus, type 2) ([place])  01/26/2015  . Hyperlipidemia 01/26/2015  . Vertigo 01/26/2015  . CVA (cerebral infarction) 01/26/2015  . Chronic tension-type headache, intractable 10/06/2014  . Dizziness 10/06/2014  . Essential (primary) hypertension 10/06/2014  . Disordered sleep 10/06/2014    Past Surgical History:  Procedure Laterality Date  . ABDOMINAL HYSTERECTOMY    . CORONARY ANGIOPLASTY    . ESOPHAGOGASTRODUODENOSCOPY (EGD) WITH PROPOFOL N/A 08/24/2015   Procedure: ESOPHAGOGASTRODUODENOSCOPY (EGD) WITH PROPOFOL with dialation;  Surgeon: Lucilla Lame, MD;  Location: [facility];  Service: Endoscopy;  Laterality: N/A;  Diabetic - oral meds  . TUBAL LIGATION      Prior to Admission medications   Medication Sig Start Date End Date Taking? Authorizing Provider  amitriptyline (ELAVIL) 25 MG tablet Take 1 tablet (25 mg total) by mouth at bedtime. PRN sleep 12/22/15  Yes Kathrine Haddock, NP  aspirin 81 MG tablet Take 81 mg by mouth daily.   Yes Historical Provider, MD  clopidogrel (PLAVIX) 75 MG tablet Take 1 tablet (75 mg total) by mouth daily. 10/02/15  Yes Kathrine Haddock, NP  escitalopram (LEXAPRO) 20 MG tablet Take 1 tablet (20 mg total) by mouth daily. 05/18/15  Yes Kathrine Haddock, NP  famotidine (PEPCID) 20 MG tablet Take 1 tablet (20 mg total) by mouth 2 (two) times daily. 12/28/15  Yes Volney American, PA-C  Flaxseed, Linseed, (FLAX SEED OIL PO) Take by mouth 2 (two) times daily.   Yes Historical Provider, MD  glipiZIDE (GLUCOTROL XL) 5 MG 24 hr tablet Take 1 tablet (5 mg total)  by mouth daily with breakfast. 11/20/15  Yes Kathrine Haddock, NP  JANUVIA 100 MG tablet Take 100 mg by mouth daily.  11/16/15  Yes Historical Provider, MD  metFORMIN (GLUCOPHAGE-XR) 500 MG 24 hr tablet Take 1 tablet (500 mg total) by mouth 4 (four) times daily. Patient taking differently: Take 500 mg by mouth. 4 tablets daily 02/02/15  Yes Kathrine Haddock, NP  metoprolol succinate (TOPROL-XL) 25 MG 24 hr tablet Take 12.5 mg by mouth  daily.  04/15/14  Yes Historical Provider, MD  Multiple Vitamin (MULTIVITAMIN) tablet Take 1 tablet by mouth daily.   Yes Historical Provider, MD  pantoprazole (PROTONIX) 40 MG tablet Take 1 tablet (40 mg total) by mouth 2 (two) times daily. 08/24/15  Yes Lucilla Lame, MD  simvastatin (ZOCOR) 40 MG tablet Take 1 tablet (40 mg total) by mouth daily at 6 PM. 08/24/15  Yes Kathrine Haddock, NP  valsartan-hydrochlorothiazide (DIOVAN-HCT) 320-25 MG tablet Take 1 tablet by mouth daily. DC Irbesartan 11/18/15  Yes Kathrine Haddock, NP  Dulaglutide (TRULICITY) 1.5 OI/3.2PQ SOPN Inject 1.5 mg into the skin once a week. 02/06/15   Kathrine Haddock, NP  meclizine (ANTIVERT) 25 MG tablet Take 1 tablet (25 mg total) by mouth every 6 (six) hours as needed. Reported on 08/13/2015 Patient taking differently: Take 25 mg by mouth every 6 (six) hours as needed for dizziness. Reported on 08/13/2015 09/21/15   Kathrine Haddock, NP    Allergies Prozac [fluoxetine hcl]  Family History  Problem Relation Age of Onset  . Diabetes Mother   . Hypertension Mother   . Stroke Mother   . Heart disease Mother     MI  . Cancer Father     pancreatic  . Stroke Father   . Hypertension Sister   . Diabetes Brother   . Diabetes Maternal Grandfather   . Diabetes Sister   . Diabetes Sister   . Diabetes Brother   . Diabetes Brother     Social History Social History  Substance Use Topics  . Smoking status: Never Smoker  . Smokeless tobacco: Never Used  . Alcohol use No    Review of Systems Constitutional: No fever/chills Eyes: No visual changes. ENT: No sore throat. Cardiovascular: Denies chest pain. Respiratory: Denies shortness of breath. Gastrointestinal: No abdominal pain.  No nausea, no vomiting.  No diarrhea.  No constipation. Genitourinary: Negative for dysuria. Musculoskeletal: Negative for back pain. Skin: Negative for rash. Neurological: Negative for headaches, focal weakness or numbness.  10-point ROS otherwise  negative.  ____________________________________________   PHYSICAL EXAM:  VITAL SIGNS: ED Triage Vitals  Enc Vitals Group     BP 01/20/16 2010 (!) 144/94     Pulse Rate 01/20/16 2010 97     Resp 01/20/16 2010 16     Temp 01/20/16 2010 97.4 F (36.3 C)     Temp Source 01/20/16 2010 Oral     SpO2 01/20/16 2010 97 %     Weight 01/20/16 2004 170 lb (77.1 kg)     Height 01/20/16 2004 _0  (1.702 m)     Head Circumference --      Peak Flow --      Pain Score 01/20/16 2005 6     Pain Loc --      Pain Edu? --      Excl. in [place]? --     Constitutional: Alert and oriented. Well appearing and in no acute distress. Eyes: Conjunctivae are normal. PERRL. EOMI. Head: Atraumatic. Mouth/Throat: Mucous membranes are moist.  Neck: No stridor.  No meningeal signs.   Cardiovascular: Normal rate, regular rhythm. Good peripheral circulation. Grossly normal heart sounds.   Respiratory: Normal respiratory effort.  No retractions. Lungs CTAB. Gastrointestinal: Soft and nontender. No distention.  Genitourinary:  Musculoskeletal: No lower extremity tenderness nor edema. No gross deformities of extremities. Neurologic:  Normal speech and language. No gross focal neurologic deficits are appreciated.  Skin:  Skin is warm, dry and intact. No rash noted. Psychiatric: Mood and affect are normal. Speech and behavior are normal.  ____________________________________________   LABS (all labs ordered are listed, but only abnormal results are displayed)  Labs Reviewed  COMPREHENSIVE METABOLIC PANEL - Abnormal; Notable for the following:       Result Value   Chloride 97 (*)    Glucose, Bld 189 (*)    All other components within normal limits  CBC - Abnormal; Notable for the following:    Hemoglobin 11.8 (*)    RDW 17.3 (*)    All other components within normal limits  URINALYSIS COMPLETEWITH MICROSCOPIC ([facility] ONLY) - Abnormal; Notable for the following:    Color, Urine STRAW (*)    APPearance CLEAR  (*)    Leukocytes, UA TRACE (*)    Squamous Epithelial / LPF 0-5 (*)    All other components within normal limits  C DIFFICILE QUICK SCREEN W PCR REFLEX  GASTROINTESTINAL PANEL BY PCR, STOOL (REPLACES STOOL CULTURE)  LIPASE, BLOOD    RADIOLOGY I, [place] Theisen, personally viewed and evaluated these images (plain radiographs) as part of my medical decision making, as well as reviewing the written report by the radiologist.   Procedures    INITIAL IMPRESSION / [place] / ED COURSE  Pertinent labs & imaging results that were available during my care of the patient were reviewed by me and considered in my medical decision making (see chart for details).  Patient denied any further abdominal pain or diarrhea all in the emergency department CT scan of the abdomen and pelvis revealed no gross abnormality. Stool culture sample unable to be obtained secondary to the fact that the patient did not have a bowel movement. Specimen cuff was applied and patient advised to obtain a sample and take it to her primary care provider. Patient's stool culture from last month reviewed which was negative.  Clinical Course    ____________________________________________  FINAL CLINICAL IMPRESSION(S) / ED DIAGNOSES  Final diagnoses:  Diarrhea, unspecified type     MEDICATIONS GIVEN DURING THIS VISIT:  Medications  sodium chloride 0.9 % bolus 1,000 mL (0 mLs Intravenous Stopped 01/21/16 0059)  dicyclomine (BENTYL) capsule 10 mg (10 mg Oral Given 01/20/16 2328)  diatrizoate meglumine-sodium (GASTROGRAFIN) 66-10 % solution 15 mL (15 mLs Oral Given 01/20/16 2344)  iopamidol (ISOVUE-300) 61 % injection 100 mL (100 mLs Intravenous Contrast Given 01/21/16 0017)     NEW OUTPATIENT MEDICATIONS STARTED DURING THIS VISIT:  New Prescriptions   No medications on file      Note:  This document was prepared using Dragon voice recognition software and may include unintentional dictation  errors.    Gregor Hams, MD 01/21/16 [phone]

## 2016-01-20 NOTE — ED Notes (Signed)
Lab reports not enough stool specimen to complete gastrointestinal panel; will reorder as requested

## 2016-01-20 NOTE — ED Triage Notes (Signed)
Pt arrived to ED reporting onset of diarrhea this morning. Pt reports having had 10-15 BM today. PT reports she was just d/c from the hospital a month ago after being treated for 4 days for diarrhea. Pt reports the abd cramping is worse this time than last but stool is the same. Pt denies dark tar colored stool but verbalized it has a "feverish" smell to it. Pt denies vomiting but reports nausea beginning today. Pt reports she has not recently been treated with antibiotics.

## 2016-01-20 NOTE — ED Notes (Signed)
Pt told that we need another stool sample and a urine sample, verbalized understanding. Hat in toilet to collect stool sample, cup next to toilet for urine sample.

## 2016-01-21 ENCOUNTER — Other Ambulatory Visit: Payer: Self-pay | Admitting: Family Medicine

## 2016-01-21 ENCOUNTER — Encounter: Payer: Self-pay | Admitting: Radiology

## 2016-01-21 ENCOUNTER — Emergency Department: Payer: Medicare Other

## 2016-01-21 DIAGNOSIS — R197 Diarrhea, unspecified: Secondary | ICD-10-CM | POA: Diagnosis not present

## 2016-01-21 LAB — URINALYSIS COMPLETE WITH MICROSCOPIC (ARMC ONLY)
BACTERIA UA: NONE SEEN
Bilirubin Urine: NEGATIVE
Glucose, UA: NEGATIVE mg/dL
Hgb urine dipstick: NEGATIVE
Ketones, ur: NEGATIVE mg/dL
Nitrite: NEGATIVE
PROTEIN: NEGATIVE mg/dL
SPECIFIC GRAVITY, URINE: 1.014 (ref 1.005–1.030)
pH: 7 (ref 5.0–8.0)

## 2016-01-21 MED ORDER — IOPAMIDOL (ISOVUE-300) INJECTION 61%
100.0000 mL | Freq: Once | INTRAVENOUS | Status: AC | PRN
  Administered 2016-01-21: 100 mL via INTRAVENOUS

## 2016-01-21 NOTE — ED Notes (Signed)
Discharge instructions reviewed with patient. Patient verbalized understanding. Patient ambulated to lobby without difficulty.

## 2016-01-21 NOTE — ED Notes (Signed)
Pt still doesn't feel like she needs to have bowel movement. Hat still in toilet in the event that she can have BM. MD notified.

## 2016-01-21 NOTE — ED Notes (Signed)
Pt finished drinking oral contrast, Megan in [place] notified.

## 2016-01-22 ENCOUNTER — Other Ambulatory Visit: Payer: Self-pay

## 2016-01-22 MED ORDER — SIMVASTATIN 40 MG PO TABS: 40.0000 mg | ORAL_TABLET | Freq: Every day | ORAL | 1 refills | Status: DC

## 2016-01-25 ENCOUNTER — Ambulatory Visit (INDEPENDENT_AMBULATORY_CARE_PROVIDER_SITE_OTHER): Payer: Medicare Other | Admitting: Unknown Physician Specialty

## 2016-01-25 ENCOUNTER — Encounter: Payer: Self-pay | Admitting: Unknown Physician Specialty

## 2016-01-25 VITALS — BP 117/78 | HR 123 | Temp 97.6°F | Ht 66.5 in | Wt 176.8 lb

## 2016-01-25 DIAGNOSIS — K591 Functional diarrhea: Secondary | ICD-10-CM | POA: Diagnosis not present

## 2016-01-25 DIAGNOSIS — K219 Gastro-esophageal reflux disease without esophagitis: Secondary | ICD-10-CM

## 2016-01-25 DIAGNOSIS — R5382 Chronic fatigue, unspecified: Secondary | ICD-10-CM

## 2016-01-25 DIAGNOSIS — D7589 Other specified diseases of blood and blood-forming organs: Secondary | ICD-10-CM

## 2016-01-25 DIAGNOSIS — E1122 Type 2 diabetes mellitus with diabetic chronic kidney disease: Secondary | ICD-10-CM | POA: Diagnosis not present

## 2016-01-25 DIAGNOSIS — N182 Chronic kidney disease, stage 2 (mild): Secondary | ICD-10-CM

## 2016-01-25 HISTORY — DX: Other specified diseases of blood and blood-forming organs: D75.89

## 2016-01-25 HISTORY — DX: Functional diarrhea: K59.1

## 2016-01-25 MED ORDER — METFORMIN HCL ER 500 MG PO TB24: 1000.0000 mg | ORAL_TABLET | Freq: Every day | ORAL | 3 refills | Status: DC

## 2016-01-25 NOTE — Assessment & Plan Note (Addendum)
Check [place].  Discussed counseling.  Keep a food diary

## 2016-01-25 NOTE — Assessment & Plan Note (Signed)
Check B12

## 2016-01-25 NOTE — Assessment & Plan Note (Signed)
Decrease Metformin from 2,000 to 1,000 due to diarrhea.  Stop Januvia as is on Trulicity.  Reviewed injection sites.

## 2016-01-25 NOTE — Progress Notes (Signed)
BP 117/78 (BP Location: Left Arm, Patient Position: Sitting, Cuff Size: Normal)   Pulse (!) 123   Temp 97.6 F (36.4 C)   Ht 5' 6.5" (1.689 m)   Wt 176 lb 12.8 oz (80.2 kg)   LMP  (LMP Unknown)   BMI 28.11 kg/m    Subjective:    Patient ID: Jamie Murray, female    DOB: [birth year], 70 y.o.   MRN: [id number]  HPI: Jamie Murray is a 70 y.o. female  Chief Complaint  Patient presents with  . Hospitalization Follow-up  . Orders    pt states she would like to have order for mammogram   Pt is here with her daughter with concerns of fatigue and episodic diarrhea.    Seen 8/16 for diarrhea and needed to go to the hospital in an ambulance.  She was rehydrated and discharged with no cause found.  Abdominal and pelvic CT was negative and reviewed. .  She was discharged earlier from the hospital for similar symptoms.  She has not been able to identify foods causing problems.  She did see GI and get an endoscopy 3/20 and diagnosed with a Hiatal hernia.  She has not had an assessment for her diarrhea.    She saw Dr. Allen Norris earlier for an endoscopy with a hiatal hernia.    Fatigue Extreme fatige.  She would like a B12 shot for "energy".  Mild anemia from last hospital visit with some macrocytosis.  But, this has been a chronic problem noted for years by her, myself and her daughter.  She is dealing with a great amount of stress.      Relevant past medical, surgical, family and social history reviewed and updated as indicated. Interim medical history since our last visit reviewed. Allergies and medications reviewed and updated.  Review of Systems  Per HPI unless specifically indicated above     Objective:    BP 117/78 (BP Location: Left Arm, Patient Position: Sitting, Cuff Size: Normal)   Pulse (!) 123   Temp 97.6 F (36.4 C)   Ht 5' 6.5" (1.689 m)   Wt 176 lb 12.8 oz (80.2 kg)   LMP  (LMP Unknown)   BMI 28.11 kg/m   Wt Readings from Last 3 Encounters:  01/25/16  176 lb 12.8 oz (80.2 kg)  01/20/16 170 lb (77.1 kg)  12/28/15 175 lb (79.4 kg)    Physical Exam  Constitutional: She is oriented to person, place, and time. She appears well-developed and well-nourished. No distress.  HENT:  Head: Normocephalic and atraumatic.  Eyes: Conjunctivae and lids are normal. Right eye exhibits no discharge. Left eye exhibits no discharge. No scleral icterus.  Neck: Normal range of motion. Neck supple. No JVD present. Carotid bruit is not present.  Cardiovascular: Normal rate, regular rhythm and normal heart sounds.   Pulmonary/Chest: Effort normal and breath sounds normal.  Abdominal: Normal appearance. There is no splenomegaly or hepatomegaly.  Musculoskeletal: Normal range of motion.  Neurological: She is alert and oriented to person, place, and time.  Skin: Skin is warm, dry and intact. No rash noted. No pallor.  Psychiatric: She has a normal mood and affect. Her behavior is normal. Judgment and thought content normal.    Results for orders placed or performed during the hospital encounter of 01/20/16  C difficile quick scan w PCR reflex  Result Value Ref Range   C Diff antigen NEGATIVE NEGATIVE   C Diff toxin NEGATIVE NEGATIVE   C Diff  interpretation No C. difficile detected.   Lipase, blood  Result Value Ref Range   Lipase 22 11 - 51 U/L  Comprehensive metabolic panel  Result Value Ref Range   Sodium 135 135 - 145 mmol/L   Potassium 3.5 3.5 - 5.1 mmol/L   Chloride 97 (L) 101 - 111 mmol/L   CO2 26 22 - 32 mmol/L   Glucose, Bld 189 (H) 65 - 99 mg/dL   BUN 16 6 - 20 mg/dL   Creatinine, Ser 0.80 0.44 - 1.00 mg/dL   Calcium 9.5 8.9 - 10.3 mg/dL   Total Protein 7.9 6.5 - 8.1 g/dL   Albumin 4.2 3.5 - 5.0 g/dL   AST 26 15 - 41 U/L   ALT 21 14 - 54 U/L   Alkaline Phosphatase 67 38 - 126 U/L   Total Bilirubin 0.7 0.3 - 1.2 mg/dL   GFR calc non Af Amer >60 >60 mL/min   GFR calc Af Amer >60 >60 mL/min   Anion gap 12 5 - 15  CBC  Result Value Ref  Range   WBC 8.2 3.6 - 11.0 K/uL   RBC 4.28 3.80 - 5.20 MIL/uL   Hemoglobin 11.8 (L) 12.0 - 16.0 g/dL   HCT 37.0 35.0 - 47.0 %   MCV 86.3 80.0 - 100.0 fL   MCH 27.7 26.0 - 34.0 pg   MCHC 32.0 32.0 - 36.0 g/dL   RDW 17.3 (H) 11.5 - 14.5 %   Platelets 331 150 - 440 K/uL  Urinalysis complete, with microscopic  Result Value Ref Range   Color, Urine STRAW (A) YELLOW   APPearance CLEAR (A) CLEAR   Glucose, UA NEGATIVE NEGATIVE mg/dL   Bilirubin Urine NEGATIVE NEGATIVE   Ketones, ur NEGATIVE NEGATIVE mg/dL   Specific Gravity, Urine 1.014 1.005 - 1.030   Hgb urine dipstick NEGATIVE NEGATIVE   pH 7.0 5.0 - 8.0   Protein, ur NEGATIVE NEGATIVE mg/dL   Nitrite NEGATIVE NEGATIVE   Leukocytes, UA TRACE (A) NEGATIVE   RBC / HPF 0-5 0 - 5 RBC/hpf   WBC, UA 0-5 0 - 5 WBC/hpf   Bacteria, UA NONE SEEN NONE SEEN   Squamous Epithelial / LPF 0-5 (A) NONE SEEN      Assessment & Plan:   Problem List Items Addressed This Visit      Unprioritized   Diarrhea, functional    Check [place].  Discussed counseling.  Keep a food diary      Relevant Orders   US Abdomen Complete   Ambulatory referral to Gastroenterology   DM type 2 (diabetes mellitus, type 2) ([place])    Decrease Metformin from 2,000 to 1,000 due to diarrhea.  Stop Januvia as is on Trulicity.  Reviewed injection sites.        Relevant Medications   metFORMIN (GLUCOPHAGE-XR) 500 MG 24 hr tablet   Gastroesophageal reflux disease without esophagitis - Primary   Relevant Orders   US Abdomen Complete   Macrocytosis    Check B12       Other Visit Diagnoses    Chronic fatigue       Relevant Orders   B12   CBC with Differential/Platelet       Follow up plan: Return in about 4 weeks (around 02/22/2016).

## 2016-01-26 LAB — CBC WITH DIFFERENTIAL/PLATELET
BASOS ABS: 0 10*3/uL (ref 0.0–0.2)
BASOS: 0 %
EOS (ABSOLUTE): 0.4 10*3/uL (ref 0.0–0.4)
Eos: 8 %
HEMOGLOBIN: 10.9 g/dL — AB (ref 11.1–15.9)
Hematocrit: 33.6 % — ABNORMAL LOW (ref 34.0–46.6)
IMMATURE GRANS (ABS): 0 10*3/uL (ref 0.0–0.1)
IMMATURE GRANULOCYTES: 0 %
LYMPHS: 30 %
Lymphocytes Absolute: 1.6 10*3/uL (ref 0.7–3.1)
MCH: 27.8 pg (ref 26.6–33.0)
MCHC: 32.4 g/dL (ref 31.5–35.7)
MCV: 86 fL (ref 79–97)
MONOCYTES: 4 %
Monocytes Absolute: 0.2 10*3/uL (ref 0.1–0.9)
NEUTROS ABS: 3 10*3/uL (ref 1.4–7.0)
Neutrophils: 58 %
Platelets: 264 10*3/uL (ref 150–379)
RBC: 3.92 x10E6/uL (ref 3.77–5.28)
RDW: 16.1 % — ABNORMAL HIGH (ref 12.3–15.4)
WBC: 5.2 10*3/uL (ref 3.4–10.8)

## 2016-01-26 LAB — VITAMIN B12: Vitamin B-12: 322 pg/mL (ref 211–946)

## 2016-01-27 ENCOUNTER — Other Ambulatory Visit: Payer: Self-pay | Admitting: Unknown Physician Specialty

## 2016-01-27 ENCOUNTER — Ambulatory Visit (INDEPENDENT_AMBULATORY_CARE_PROVIDER_SITE_OTHER): Payer: Medicare Other

## 2016-01-27 ENCOUNTER — Telehealth: Payer: Self-pay | Admitting: Unknown Physician Specialty

## 2016-01-27 DIAGNOSIS — D649 Anemia, unspecified: Secondary | ICD-10-CM

## 2016-01-27 DIAGNOSIS — E538 Deficiency of other specified B group vitamins: Secondary | ICD-10-CM

## 2016-01-27 MED ORDER — CYANOCOBALAMIN 1000 MCG/ML IJ SOLN
1000.0000 ug | INTRAMUSCULAR | Status: AC
  Administered 2016-01-27 – 2016-02-17 (×4): 1000 ug via INTRAMUSCULAR

## 2016-01-27 NOTE — Telephone Encounter (Signed)
Discussed with pt mild anemia.  Lower than normal B12 levels.  Ordered weekly B12 and will check a CBC in 1 month.  If no improvement, refer to hematology

## 2016-01-28 ENCOUNTER — Telehealth: Payer: Self-pay | Admitting: Unknown Physician Specialty

## 2016-01-28 NOTE — Telephone Encounter (Signed)
RX form filled out. Will get Malachy Mood to sign tomorrow and fax to the pharmacy.

## 2016-01-28 NOTE — Telephone Encounter (Signed)
Renee from [facility] called stated pt needs an RX for a new glucose meter. Please use "generic" as they don't know which meter insurance will cover. Thanks.

## 2016-01-29 ENCOUNTER — Ambulatory Visit: Admission: RE | Admit: 2016-01-29 | Payer: Medicare Other | Source: Ambulatory Visit

## 2016-01-29 NOTE — Telephone Encounter (Signed)
Form signed by Malachy Mood and faxed to pharmacy.

## 2016-02-01 ENCOUNTER — Ambulatory Visit
Admission: RE | Admit: 2016-02-01 | Discharge: 2016-02-01 | Disposition: A | Discharge status: Home / Self Care | Payer: Medicare Other | Source: Ambulatory Visit | Attending: Unknown Physician Specialty | Admitting: Unknown Physician Specialty

## 2016-02-01 DIAGNOSIS — K760 Fatty (change of) liver, not elsewhere classified: Secondary | ICD-10-CM | POA: Diagnosis not present

## 2016-02-01 DIAGNOSIS — K219 Gastro-esophageal reflux disease without esophagitis: Secondary | ICD-10-CM | POA: Diagnosis present

## 2016-02-01 DIAGNOSIS — R93422 Abnormal radiologic findings on diagnostic imaging of left kidney: Secondary | ICD-10-CM | POA: Diagnosis not present

## 2016-02-01 DIAGNOSIS — K591 Functional diarrhea: Secondary | ICD-10-CM

## 2016-02-03 ENCOUNTER — Ambulatory Visit (INDEPENDENT_AMBULATORY_CARE_PROVIDER_SITE_OTHER): Payer: Medicare Other

## 2016-02-03 DIAGNOSIS — E538 Deficiency of other specified B group vitamins: Secondary | ICD-10-CM

## 2016-02-10 ENCOUNTER — Ambulatory Visit (INDEPENDENT_AMBULATORY_CARE_PROVIDER_SITE_OTHER): Payer: Medicare Other

## 2016-02-10 DIAGNOSIS — E538 Deficiency of other specified B group vitamins: Secondary | ICD-10-CM | POA: Diagnosis not present

## 2016-02-17 ENCOUNTER — Ambulatory Visit (INDEPENDENT_AMBULATORY_CARE_PROVIDER_SITE_OTHER): Payer: Medicare Other

## 2016-02-17 DIAGNOSIS — E538 Deficiency of other specified B group vitamins: Secondary | ICD-10-CM

## 2016-02-22 ENCOUNTER — Other Ambulatory Visit: Payer: Self-pay

## 2016-02-22 ENCOUNTER — Telehealth: Payer: Self-pay | Admitting: Unknown Physician Specialty

## 2016-02-22 DIAGNOSIS — E1165 Type 2 diabetes mellitus with hyperglycemia: Principal | ICD-10-CM

## 2016-02-22 DIAGNOSIS — IMO0001 Reserved for inherently not codable concepts without codable children: Secondary | ICD-10-CM

## 2016-02-22 MED ORDER — GLIPIZIDE ER 5 MG PO TB24: 5.0000 mg | ORAL_TABLET | Freq: Every day | ORAL | 2 refills | Status: DC

## 2016-02-22 NOTE — Telephone Encounter (Signed)
Called and spoke to patient. I let her know that the RX for her meter was sent to the pharmacy on Thursday. Patient also stated she has been having hot flashes and feeling nervous and anxious ever since starting the valsartan/hctz medication. Patient wants to know if these are side effects or what she should do.

## 2016-02-22 NOTE — Telephone Encounter (Signed)
Called and let patient know what Malachy Mood said.

## 2016-02-22 NOTE — Telephone Encounter (Signed)
Pt needs to talk to [place] about the Diovan-HCT and has questions about getting a meter.  Please call pt.

## 2016-02-22 NOTE — Telephone Encounter (Signed)
These are not typical side effects of this medication.  I think it's probably related to anxiety.  Give the medicine some time

## 2016-02-23 ENCOUNTER — Other Ambulatory Visit: Payer: Self-pay

## 2016-02-23 MED ORDER — DULAGLUTIDE 1.5 MG/0.5ML ~~LOC~~ SOAJ: 1.5000 mg | SUBCUTANEOUS | 3 refills | Status: DC

## 2016-02-23 MED ORDER — VALSARTAN-HYDROCHLOROTHIAZIDE 320-25 MG PO TABS: 1.0000 | ORAL_TABLET | Freq: Every day | ORAL | 1 refills | Status: DC

## 2016-02-26 ENCOUNTER — Ambulatory Visit (INDEPENDENT_AMBULATORY_CARE_PROVIDER_SITE_OTHER): Payer: Medicare Other | Admitting: Podiatry

## 2016-02-26 VITALS — BP 113/80 | HR 93 | Resp 16

## 2016-02-26 DIAGNOSIS — M79671 Pain in right foot: Secondary | ICD-10-CM

## 2016-02-26 DIAGNOSIS — B353 Tinea pedis: Secondary | ICD-10-CM

## 2016-02-26 DIAGNOSIS — E119 Type 2 diabetes mellitus without complications: Secondary | ICD-10-CM | POA: Diagnosis not present

## 2016-02-26 DIAGNOSIS — M79672 Pain in left foot: Secondary | ICD-10-CM | POA: Diagnosis not present

## 2016-02-26 MED ORDER — NAFTIFINE HCL 1 % EX CREA: TOPICAL_CREAM | Freq: Every day | CUTANEOUS | 1 refills | Status: DC

## 2016-02-26 MED ORDER — TERBINAFINE HCL 250 MG PO TABS: 250.0000 mg | ORAL_TABLET | Freq: Every day | ORAL | 0 refills | Status: DC

## 2016-02-26 NOTE — Patient Instructions (Signed)
[place] Type Tinea Pedis Athlete's foot (tinea pedis) is a fungal infection of the skin on the feet. It often occurs on the skin between the toes or underneath the toes. It can also occur on the soles of the feet. Athlete's foot is more likely to occur in hot, humid weather. Not washing your feet or changing your socks often enough can contribute to athlete's foot. The infection can spread from person to person (contagious). CAUSES Athlete's foot is caused by a fungus. This fungus thrives in warm, moist places. Most people get athlete's foot by sharing shower stalls, towels, and wet floors with an infected person. People with weakened immune systems, including those with diabetes, may be more likely to get athlete's foot. SYMPTOMS   Itchy areas between the toes or on the soles of the feet.  White, flaky, or scaly areas between the toes or on the soles of the feet.  Tiny, intensely itchy blisters between the toes or on the soles of the feet.  Tiny cuts on the skin. These cuts can develop a bacterial infection.  Thick or discolored toenails. DIAGNOSIS  Your caregiver can usually tell what the problem is by doing a physical exam. Your caregiver may also take a skin sample from the rash area. The skin sample may be examined under a microscope, or it may be tested to see if fungus will grow in the sample. A sample may also be taken from your toenail for testing. TREATMENT  Over-the-counter and prescription medicines can be used to kill the fungus. These medicines are available as powders or creams. Your caregiver can suggest medicines for you. Fungal infections respond slowly to treatment. You may need to continue using your medicine for several weeks. PREVENTION   Do not share towels.  Wear sandals in wet areas, such as shared locker rooms and shared showers.  Keep your feet dry. Wear shoes that allow air to circulate. Wear cotton or wool socks. HOME CARE INSTRUCTIONS   Take medicines as  directed by your caregiver. Do not use steroid creams on athlete's foot.  Keep your feet clean and cool. Wash your feet daily and dry them thoroughly, especially between your toes.  Change your socks every day. Wear cotton or wool socks. In hot climates, you may need to change your socks 2 to 3 times per day.  Wear sandals or canvas tennis shoes with good air circulation.  If you have blisters, soak your feet in Burow's solution or Epsom salts for 20 to 30 minutes, 2 times a day to dry out the blisters. Make sure you dry your feet thoroughly afterward. SEEK MEDICAL CARE IF:   You have a fever.  You have swelling, soreness, warmth, or redness in your foot.  You are not getting better after 7 days of treatment.  You are not completely cured after 30 days.  You have any problems caused by your medicines. MAKE SURE YOU:   Understand these instructions.  Will watch your condition.  Will get help right away if you are not doing well or get worse.   This information is not intended to replace advice given to you by your health care provider. Make sure you discuss any questions you have with your health care provider.   Document Released: 05/20/2000 Document Revised: 08/15/2011 Document Reviewed: 11/24/2014 Elsevier Interactive Patient Education [employer].

## 2016-02-29 NOTE — Progress Notes (Signed)
Subjective: Patient presents today for itching and peeling of her bilateral feet which been going on for several weeks now. Patient states the itching is constant. Patient presents today for further treatment and evaluation   Objective: Physical Exam General: The patient is alert and oriented x3 in no acute distress.  Dermatology: Hyperkeratotic, pruritic, peeling skin noted to the bilateral feet around the weightbearing surfaces.   Skin is warm, dry and supple bilateral lower extremities. Negative for open lesions or macerations.  Vascular: Palpable pedal pulses bilaterally. No edema or erythema noted. Capillary refill within normal limits.  Neurological: Epicritic and protective threshold grossly intact bilaterally.   Musculoskeletal Exam: Range of motion within normal limits to all pedal and ankle joints bilateral. Muscle strength 5/5 in all groups bilateral.   Assessment: #1 tinea pedis bilateral-moccasin type #2 pruritus bilateral feet #3 pain in bilateral feet  Problem List Items Addressed This Visit    None    Visit Diagnoses   None.       Plan of Care:  #1 Patient was evaluated. #2 Today prescription for Lamisil 250 mg 28 days was prescribed along with Naftin 2% gel. #3 Patient is to return to clinic in 4 weeks     Dr. Edrick Kins, [place]

## 2016-03-02 ENCOUNTER — Encounter: Payer: Self-pay | Admitting: Gastroenterology

## 2016-03-02 ENCOUNTER — Ambulatory Visit (INDEPENDENT_AMBULATORY_CARE_PROVIDER_SITE_OTHER): Payer: Medicare Other | Admitting: Gastroenterology

## 2016-03-02 VITALS — BP 164/91 | HR 105 | Temp 97.6°F | Ht 67.0 in | Wt 180.0 lb

## 2016-03-02 DIAGNOSIS — K591 Functional diarrhea: Secondary | ICD-10-CM

## 2016-03-02 NOTE — Progress Notes (Signed)
Primary Care Physician: Kathrine Haddock, NP  Primary Gastroenterologist:  Dr. Lucilla Lame  Chief Complaint  Patient presents with  . Diarrhea    HPI: Jamie Murray is a 70 y.o. female here today with a report of having 2 episodes of diarrhea back in August which did and her going to the emergency department. They were both self-limiting and she states that she has not had any further episodes since August she does report that she was eating a banana split and started to have diarrhea after that. There is no report of any unexplained weight loss. The patient did have lab work done that showed her to have slight anemia with a high TSH. She now reports that her main concern is her fatigue.  Current Outpatient Prescriptions  Medication Sig Dispense Refill  . amitriptyline (ELAVIL) 25 MG tablet Take 1 tablet (25 mg total) by mouth at bedtime. PRN sleep 30 tablet 3  . aspirin 81 MG tablet Take 81 mg by mouth daily.    . clopidogrel (PLAVIX) 75 MG tablet Take 1 tablet (75 mg total) by mouth daily. 90 tablet 3  . Dulaglutide (TRULICITY) 1.5 VH/2.9WT SOPN Inject 1.5 mg into the skin once a week. 12 pen 3  . escitalopram (LEXAPRO) 20 MG tablet Take 1 tablet (20 mg total) by mouth daily. 90 tablet 1  . famotidine (PEPCID) 20 MG tablet Take 1 tablet (20 mg total) by mouth 2 (two) times daily. 60 tablet 3  . Flaxseed, Linseed, (FLAX SEED OIL PO) Take by mouth 2 (two) times daily.    Marland Kitchen glipiZIDE (GLUCOTROL XL) 5 MG 24 hr tablet Take 1 tablet (5 mg total) by mouth daily with breakfast. 30 tablet 2  . meclizine (ANTIVERT) 25 MG tablet Take 1 tablet (25 mg total) by mouth every 6 (six) hours as needed. Reported on 08/13/2015 (Patient taking differently: Take 25 mg by mouth every 6 (six) hours as needed for dizziness. Reported on 08/13/2015) 30 tablet 1  . metFORMIN (GLUCOPHAGE-XR) 500 MG 24 hr tablet Take 2 tablets (1,000 mg total) by mouth daily with breakfast. 180 tablet 3  . metoprolol succinate  (TOPROL-XL) 25 MG 24 hr tablet Take 12.5 mg by mouth daily.     . Multiple Vitamin (MULTIVITAMIN) tablet Take 1 tablet by mouth daily.    . naftifine (NAFTIN) 1 % cream Apply topically daily. 30 g 1  . pantoprazole (PROTONIX) 40 MG tablet Take 1 tablet (40 mg total) by mouth 2 (two) times daily. 60 tablet 11  . simvastatin (ZOCOR) 40 MG tablet Take 1 tablet (40 mg total) by mouth daily at 6 PM. 90 tablet 1  . terbinafine (LAMISIL) 250 MG tablet Take 1 tablet (250 mg total) by mouth daily. 28 tablet 0  . valsartan-hydrochlorothiazide (DIOVAN-HCT) 320-25 MG tablet Take 1 tablet by mouth daily. DC Irbesartan 30 tablet 1  . Blood Glucose Monitoring Suppl (GNP EASY TOUCH GLUCOSE METER) DEVI See admin instructions.  0  . EASY TOUCH TEST test strip USE TO CHECK BLOOD SUGAR ONCE DAILY  0   No current facility-administered medications for this visit.     Allergies as of 03/02/2016 - Review Complete 03/02/2016  Allergen Reaction Noted  . Prozac [fluoxetine hcl] Other (See Comments) 01/26/2015    ROS:  General: Negative for anorexia, weight loss, fever, chills, fatigue, weakness. ENT: Negative for hoarseness, difficulty swallowing , nasal congestion. CV: Negative for chest pain, angina, palpitations, dyspnea on exertion, peripheral edema.  Respiratory: Negative for dyspnea  at rest, dyspnea on exertion, cough, sputum, wheezing.  GI: See history of present illness. GU:  Negative for dysuria, hematuria, urinary incontinence, urinary frequency, nocturnal urination.  Endo: Negative for unusual weight change.    Physical Examination:   BP (!) 164/91   Pulse (!) 105   Temp 97.6 F (36.4 C) (Oral)   Ht _0  (1.702 m)   Wt 180 lb (81.6 kg)   LMP  (LMP Unknown)   BMI 28.19 kg/m   General: Well-nourished, well-developed in no acute distress.  Eyes: No icterus. Conjunctivae pink. Mouth: Oropharyngeal mucosa moist and pink , no lesions erythema or exudate. Lungs: Clear to auscultation  bilaterally. Non-labored. Heart: Regular rate and rhythm, no murmurs rubs or gallops.  Abdomen: Bowel sounds are normal, nontender, nondistended, no hepatosplenomegaly or masses, no abdominal bruits or hernia , no rebound or guarding.   Extremities: No lower extremity edema. No clubbing or deformities. Neuro: Alert and oriented x 3.  Grossly intact. Skin: Warm and dry, no jaundice.   Psych: Alert and cooperative, normal mood and affect.  Labs:    Imaging Studies: US Abdomen Complete  Result Date: 02/01/2016 CLINICAL DATA:  Gastroesophageal reflux symptoms for the past month, and diarrhea. EXAM: ABDOMEN ULTRASOUND COMPLETE COMPARISON:  Abdominal and pelvic CT scan of January 21, 2016. FINDINGS: Gallbladder: No gallstones or wall thickening visualized. No sonographic Murphy sign noted by sonographer. Common bile duct: Diameter: 3.5 mm Liver: The hepatic echotexture is mildly increased. There is no focal mass or ductal dilation. IVC: No abnormality visualized. Pancreas: Visualized portion unremarkable. Spleen: Size and appearance within normal limits. Right Kidney: Length: 10.6 cm. Echogenicity within normal limits. No mass or hydronephrosis visualized. Left Kidney: Length: 10.8 cm. In the upper pole cortex there is an 7 x 7 x 5 mm focus which likely reflects an angiomyo lipoma. The renal cortical echotexture is otherwise normal. There is no hydronephrosis. Abdominal aorta: No aneurysm visualized. Other findings: None. IMPRESSION: 1. No gallstones or sonographic evidence of acute cholecystitis. Mild fatty infiltrative change of the liver. 2. Probable sub cm upper pole angiomyolipoma of the left kidney. Electronically Signed   By: David  Martinique M.D.   On: 02/01/2016 10:42    Assessment and Plan:   Jamie Murray is a 70 y.o. y/o female who reports that she has fatigue and had episodes of diarrhea back in August. The patient now states that the diarrhea has completely resolved. She is not having  any abdominal pain or unexplained weight loss. The patient has a follow-up with her PCP. The patient has had a low blood count with her hemoglobin down to 10.9 and her TSH was high at lightly above 5. She will address these concerns with her PCP during her appointment next week. She has been told to contact me if her diarrhea returns.   Note: This dictation was prepared with Dragon dictation along with smaller phrase technology. Any transcriptional errors that result from this process are unintentional.

## 2016-03-07 ENCOUNTER — Ambulatory Visit (INDEPENDENT_AMBULATORY_CARE_PROVIDER_SITE_OTHER): Payer: PRIVATE HEALTH INSURANCE | Admitting: Unknown Physician Specialty

## 2016-03-07 ENCOUNTER — Other Ambulatory Visit: Payer: Self-pay

## 2016-03-07 ENCOUNTER — Encounter: Payer: Self-pay | Admitting: Unknown Physician Specialty

## 2016-03-07 VITALS — BP 158/91 | HR 92 | Temp 98.0°F | Ht 66.5 in | Wt 177.6 lb

## 2016-03-07 DIAGNOSIS — F418 Other specified anxiety disorders: Secondary | ICD-10-CM

## 2016-03-07 DIAGNOSIS — N182 Chronic kidney disease, stage 2 (mild): Secondary | ICD-10-CM | POA: Diagnosis not present

## 2016-03-07 DIAGNOSIS — E538 Deficiency of other specified B group vitamins: Secondary | ICD-10-CM

## 2016-03-07 DIAGNOSIS — Z23 Encounter for immunization: Secondary | ICD-10-CM | POA: Diagnosis not present

## 2016-03-07 DIAGNOSIS — R42 Dizziness and giddiness: Secondary | ICD-10-CM | POA: Diagnosis not present

## 2016-03-07 DIAGNOSIS — R5383 Other fatigue: Secondary | ICD-10-CM

## 2016-03-07 DIAGNOSIS — F419 Anxiety disorder, unspecified: Secondary | ICD-10-CM

## 2016-03-07 DIAGNOSIS — E1122 Type 2 diabetes mellitus with diabetic chronic kidney disease: Secondary | ICD-10-CM

## 2016-03-07 DIAGNOSIS — D509 Iron deficiency anemia, unspecified: Secondary | ICD-10-CM

## 2016-03-07 DIAGNOSIS — I10 Essential (primary) hypertension: Secondary | ICD-10-CM | POA: Diagnosis not present

## 2016-03-07 DIAGNOSIS — F329 Major depressive disorder, single episode, unspecified: Secondary | ICD-10-CM

## 2016-03-07 DIAGNOSIS — E785 Hyperlipidemia, unspecified: Secondary | ICD-10-CM

## 2016-03-07 DIAGNOSIS — F32A Depression, unspecified: Secondary | ICD-10-CM

## 2016-03-07 LAB — CBC WITH DIFFERENTIAL/PLATELET
HEMOGLOBIN: 11.6 g/dL (ref 11.1–15.9)
Hematocrit: 35.5 % (ref 34.0–46.6)
LYMPHS ABS: 1.3 10*3/uL (ref 0.7–3.1)
Lymphs: 29 %
MCH: 29.2 pg (ref 26.6–33.0)
MCHC: 32.7 g/dL (ref 31.5–35.7)
MCV: 89 fL (ref 79–97)
MID (ABSOLUTE): 0.2 10*3/uL (ref 0.1–1.6)
MID: 5 %
NEUTROS ABS: 2.9 10*3/uL (ref 1.4–7.0)
Neutrophils: 66 %
PLATELETS: 248 10*3/uL (ref 150–379)
RBC: 3.97 x10E6/uL (ref 3.77–5.28)
RDW: 15 % (ref 12.3–15.4)
WBC: 4.4 10*3/uL (ref 3.4–10.8)

## 2016-03-07 LAB — HEMOGLOBIN A1C

## 2016-03-07 LAB — BAYER DCA HB A1C WAIVED: HB A1C: 7.8 % — AB (ref <7.0)

## 2016-03-07 MED ORDER — METOPROLOL SUCCINATE ER 50 MG PO TB24: 50.0000 mg | ORAL_TABLET | Freq: Every day | ORAL | 1 refills | Status: DC

## 2016-03-07 MED ORDER — CYANOCOBALAMIN 1000 MCG/ML IJ SOLN
1000.0000 ug | INTRAMUSCULAR | Status: AC
  Administered 2016-03-22 – 2016-08-26 (×4): 1000 ug via INTRAMUSCULAR

## 2016-03-07 MED ORDER — LORAZEPAM 0.5 MG PO TABS: 0.5000 mg | ORAL_TABLET | Freq: Every day | ORAL | 1 refills | Status: DC | PRN

## 2016-03-07 NOTE — Assessment & Plan Note (Signed)
Treat BP.  Discussed ENT referral

## 2016-03-07 NOTE — Assessment & Plan Note (Signed)
Add Lorazepam for occasional use.  Do anxiety exercises ans recommended from therapist.  Increase metoprolol

## 2016-03-07 NOTE — Assessment & Plan Note (Addendum)
Hgb A1C is 7.8.   Stop all sugar drinks.  Cut back on tomatoes.  Will start exercises.

## 2016-03-07 NOTE — Progress Notes (Signed)
BP (!) 158/91 (BP Location: Left Arm, Cuff Size: Large)   Pulse 92   Temp 98 F (36.7 C)   Ht 5' 6.5" (1.689 m)   Wt 177 lb 9.6 oz (80.6 kg)   LMP  (LMP Unknown)   BMI 28.24 kg/m    Subjective:    Patient ID: Jamie Murray, female    DOB: 08-20-1945, 70 y.o.   MRN: [id number]  HPI: Jamie Murray is a 70 y.o. female  Chief Complaint  Patient presents with  . Fatigue    pt states she was doing good with the b12 injections   . Anxiety    pt states she has been "jumpy" for about 2 weeks   . balance issue    pt states she has been having trouble balancing    Fatigue - did well with B12 shots.  She would like more  Anxiety - states she is "jumpy."  Started counseling.  She has been jumpy for 2 weeks.  She is also anxious about a nodule in her neck.  This might be the reason for her anxiety.  She does   Balance - she has been having issues with balance.  This is a historical problem and has seen ENT and Neurology  Hypertension Using medications without difficulty Average home BPs 130's   No problems or lightheadedness No chest pain with exertion or shortness of breath No Edema  Diabetes: Using medications without difficulties No hypoglycemic episodes No hyperglycemic episodes Feet problems: none Blood Sugars averaging: 180 eye exam within last year Last Hgb A1C:7.8   Elevated Cholesterol Using medications without problems No Muscle aches  Diet:  Not good Exercise: Not exercising Not fasting today   Relevant past medical, surgical, family and social history reviewed and updated as indicated. Interim medical history since our last visit reviewed. Allergies and medications reviewed and updated.  Review of Systems  Per HPI unless specifically indicated above     Objective:    BP (!) 158/91 (BP Location: Left Arm, Cuff Size: Large)   Pulse 92   Temp 98 F (36.7 C)   Ht 5' 6.5" (1.689 m)   Wt 177 lb 9.6 oz (80.6 kg)   LMP  (LMP Unknown)    BMI 28.24 kg/m   Wt Readings from Last 3 Encounters:  03/07/16 177 lb 9.6 oz (80.6 kg)  03/02/16 180 lb (81.6 kg)  01/25/16 176 lb 12.8 oz (80.2 kg)    Physical Exam  Constitutional: She is oriented to person, place, and time. She appears well-developed and well-nourished. No distress.  HENT:  Head: Normocephalic and atraumatic.  Eyes: Conjunctivae and lids are normal. Right eye exhibits no discharge. Left eye exhibits no discharge. No scleral icterus.  Neck: Normal range of motion. Neck supple. No JVD present. Carotid bruit is not present.  Cardiovascular: Normal rate, regular rhythm and normal heart sounds.   Pulmonary/Chest: Effort normal and breath sounds normal.  Abdominal: Normal appearance. There is no splenomegaly or hepatomegaly.  Musculoskeletal: Normal range of motion.  Neurological: She is alert and oriented to person, place, and time.  Skin: Skin is warm, dry and intact. No rash noted. No pallor.  Psychiatric: She has a normal mood and affect. Her behavior is normal. Judgment and thought content normal.      Assessment & Plan:   Problem List Items Addressed This Visit      Unprioritized   Anxiety and depression    Add Lorazepam for occasional use.  Do anxiety exercises ans recommended from therapist.  Increase metoprolol      Dizziness    Treat BP.  Discussed ENT referral      DM type 2 (diabetes mellitus, type 2) ([place])    Hgb A1C is 7.8.   Stop all sugar drinks.  Cut back on tomatoes.  Will start exercises.         Relevant Orders   Bayer DCA Hb A1c Waived   Essential (primary) hypertension    Increase Metoprolol      Relevant Medications   metoprolol succinate (TOPROL-XL) 50 MG 24 hr tablet   Hyperlipidemia   Relevant Medications   metoprolol succinate (TOPROL-XL) 50 MG 24 hr tablet   Other Relevant Orders   Lipid Panel w/o Chol/HDL Ratio   Hypertension   Relevant Medications   metoprolol succinate (TOPROL-XL) 50 MG 24 hr tablet   Other Relevant  Orders   Comprehensive metabolic panel   Iron deficiency anemia   Relevant Medications   cyanocobalamin ((VITAMIN B-12)) injection 1,000 mcg (Start on 03/21/2016 12:00 AM)   Other Relevant Orders   CBC With Differential/Platelet    Other Visit Diagnoses    Need for influenza vaccination    -  Primary   Relevant Orders   Flu vaccine HIGH DOSE PF (Completed)   B12 deficiency       Relevant Medications   cyanocobalamin ((VITAMIN B-12)) injection 1,000 mcg (Start on 03/21/2016 12:00 AM)   Fatigue, unspecified type       Relevant Orders   TSH      Follow up plan: Return 1 and 3 months.

## 2016-03-07 NOTE — Patient Instructions (Signed)
Influenza (Flu) Vaccine (Inactivated or Recombinant):  1. Why get vaccinated? Influenza ("flu") is a contagious disease that spreads around the [place] every year, usually between October and May. Flu is caused by influenza viruses, and is spread mainly by coughing, sneezing, and close contact. Anyone can get flu. Flu strikes suddenly and can last several days. Symptoms vary by age, but can include:  fever/chills  sore throat  muscle aches  fatigue  cough  headache  runny or stuffy nose Flu can also lead to pneumonia and blood infections, and cause diarrhea and seizures in children. If you have a medical condition, such as heart or lung disease, flu can make it worse. Flu is more dangerous for some people. Infants and young children, people 69 years of age and older, pregnant women, and people with certain health conditions or a weakened immune system are at greatest risk. Each year thousands of people in the [place] die from flu, and many more are hospitalized. Flu vaccine can:  keep you from getting flu,  make flu less severe if you do get it, and  keep you from spreading flu to your family and other people. 2. Inactivated and recombinant flu vaccines A dose of flu vaccine is recommended every flu season. Children 6 months through 16 years of age may need two doses during the same flu season. Everyone else needs only one dose each flu season. Some inactivated flu vaccines contain a very small amount of a mercury-based preservative called thimerosal. Studies have not shown thimerosal in vaccines to be harmful, but flu vaccines that do not contain thimerosal are available. There is no live flu virus in flu shots. They cannot cause the flu. There are many flu viruses, and they are always changing. Each year a new flu vaccine is made to protect against three or four viruses that are likely to cause disease in the upcoming flu season. But even when the vaccine doesn't exactly  match these viruses, it may still provide some protection. Flu vaccine cannot prevent:  flu that is caused by a virus not covered by the vaccine, or  illnesses that look like flu but are not. It takes about 2 weeks for protection to develop after vaccination, and protection lasts through the flu season. 3. Some people should not get this vaccine Tell the person who is giving you the vaccine:  If you have any severe, life-threatening allergies. If you ever had a life-threatening allergic reaction after a dose of flu vaccine, or have a severe allergy to any part of this vaccine, you may be advised not to get vaccinated. Most, but not all, types of flu vaccine contain a small amount of egg protein.  If you ever had Guillain-Barre Syndrome (also called GBS). Some people with a history of GBS should not get this vaccine. This should be discussed with your doctor.  If you are not feeling well. It is usually okay to get flu vaccine when you have a mild illness, but you might be asked to come back when you feel better. 4. Risks of a vaccine reaction With any medicine, including vaccines, there is a chance of reactions. These are usually mild and go away on their own, but serious reactions are also possible. Most people who get a flu shot do not have any problems with it. Minor problems following a flu shot include:  soreness, redness, or swelling where the shot was given  hoarseness  sore, red or itchy eyes  cough  fever  aches  headache  itching  fatigue If these problems occur, they usually begin soon after the shot and last 1 or 2 days. More serious problems following a flu shot can include the following:  There may be a small increased risk of Guillain-Barre Syndrome (GBS) after inactivated flu vaccine. This risk has been estimated at 1 or 2 additional cases per million people vaccinated. This is much lower than the risk of severe complications from flu, which can be prevented by  flu vaccine.  Young children who get the flu shot along with pneumococcal vaccine (PCV13) and/or DTaP vaccine at the same time might be slightly more likely to have a seizure caused by fever. Ask your doctor for more information. Tell your doctor if a child who is getting flu vaccine has ever had a seizure. Problems that could happen after any injected vaccine:  People sometimes faint after a medical procedure, including vaccination. Sitting or lying down for about 15 minutes can help prevent fainting, and injuries caused by a fall. Tell your doctor if you feel dizzy, or have vision changes or ringing in the ears.  Some people get severe pain in the shoulder and have difficulty moving the arm where a shot was given. This happens very rarely.  Any medication can cause a severe allergic reaction. Such reactions from a vaccine are very rare, estimated at about 1 in a million doses, and would happen within a few minutes to a few hours after the vaccination. As with any medicine, there is a very remote chance of a vaccine causing a serious injury or death. The safety of vaccines is always being monitored. For more information, visit: http://www.aguilar.org/ 5. What if there is a serious reaction? What should I look for?  Look for anything that concerns you, such as signs of a severe allergic reaction, very high fever, or unusual behavior. Signs of a severe allergic reaction can include hives, swelling of the face and throat, difficulty breathing, a fast heartbeat, dizziness, and weakness. These would start a few minutes to a few hours after the vaccination. What should I do?  If you think it is a severe allergic reaction or other emergency that can't wait, call 9-1-1 and get the person to the nearest hospital. Otherwise, call your doctor.  Reactions should be reported to the Vaccine Adverse Event Reporting System (VAERS). Your doctor should file this report, or you can do it yourself through the  VAERS web site at www.vaers.SamedayNews.es, or by calling 757-309-9581. VAERS does not give medical advice. 6. The National Vaccine Injury Compensation Program The [employer] Vaccine Injury Compensation Program (VICP) is a federal program that was created to compensate people who may have been injured by certain vaccines. Persons who believe they may have been injured by a vaccine can learn about the program and about filing a claim by calling (216) 252-7030 or visiting the [place] website at GoldCloset.com.ee. There is a time limit to file a claim for compensation. 7. How can I learn more?  Ask your healthcare provider. He or she can give you the vaccine package insert or suggest other sources of information.  Call your local or state health department.  Contact the Centers for Disease Control and Prevention (CDC):  Call 603 792 4364 (1-800-CDC-INFO) or  Visit CDC's website at https://gibson.com/ Vaccine Information Statement Inactivated Influenza Vaccine (01/10/2014)   This information is not intended to replace advice given to you by your health care provider. Make sure you discuss any questions you have with  your health care provider.   Document Released: 03/17/2006 Document Revised: 06/13/2014 Document Reviewed: 01/13/2014 Elsevier Interactive Patient Education [employer].

## 2016-03-07 NOTE — Assessment & Plan Note (Signed)
>>  ASSESSMENT AND PLAN FOR DIZZINESS WRITTEN ON 03/07/2016 11:26 AM BY Julian Hy, CHERYL, NP  Treat BP.  Discussed ENT referral

## 2016-03-07 NOTE — Assessment & Plan Note (Signed)
Increase Metoprolol

## 2016-03-08 ENCOUNTER — Encounter: Payer: Self-pay | Admitting: Unknown Physician Specialty

## 2016-03-08 LAB — COMPREHENSIVE METABOLIC PANEL
A/G RATIO: 2 (ref 1.2–2.2)
ALT: 14 IU/L (ref 0–32)
AST: 15 IU/L (ref 0–40)
Albumin: 4.5 g/dL (ref 3.6–4.8)
Alkaline Phosphatase: 76 IU/L (ref 39–117)
BUN/Creatinine Ratio: 9 — ABNORMAL LOW (ref 12–28)
BUN: 8 mg/dL (ref 8–27)
Bilirubin Total: 0.3 mg/dL (ref 0.0–1.2)
CALCIUM: 9.8 mg/dL (ref 8.7–10.3)
CO2: 24 mmol/L (ref 18–29)
CREATININE: 0.9 mg/dL (ref 0.57–1.00)
Chloride: 98 mmol/L (ref 96–106)
GFR, EST AFRICAN AMERICAN: 75 mL/min/{1.73_m2} (ref >59)
GFR, EST NON AFRICAN AMERICAN: 65 mL/min/{1.73_m2} (ref >59)
Globulin, Total: 2.2 g/dL (ref 1.5–4.5)
Glucose: 171 mg/dL — ABNORMAL HIGH (ref 65–99)
POTASSIUM: 3.7 mmol/L (ref 3.5–5.2)
Sodium: 141 mmol/L (ref 134–144)
TOTAL PROTEIN: 6.7 g/dL (ref 6.0–8.5)

## 2016-03-08 LAB — LIPID PANEL W/O CHOL/HDL RATIO
Cholesterol, Total: 145 mg/dL (ref 100–199)
HDL: 43 mg/dL (ref >39)
LDL CALC: 77 mg/dL (ref 0–99)
Triglycerides: 123 mg/dL (ref 0–149)
VLDL CHOLESTEROL CAL: 25 mg/dL (ref 5–40)

## 2016-03-08 LAB — TSH: TSH: 3.7 u[IU]/mL (ref 0.450–4.500)

## 2016-03-15 ENCOUNTER — Telehealth: Payer: Self-pay | Admitting: Unknown Physician Specialty

## 2016-03-15 DIAGNOSIS — IMO0001 Reserved for inherently not codable concepts without codable children: Secondary | ICD-10-CM

## 2016-03-15 DIAGNOSIS — E1165 Type 2 diabetes mellitus with hyperglycemia: Principal | ICD-10-CM

## 2016-03-15 NOTE — Telephone Encounter (Signed)
Pts daugher(Karena) called and stated that her mom was very weak and tired and wondered if her vitamin D levels had been checked. She also stated that the pts blood sugar levels were average 180 and wondered if that has anything to do with the fatigue.

## 2016-03-16 MED ORDER — GLIPIZIDE ER 5 MG PO TB24: 10.0000 mg | ORAL_TABLET | Freq: Every day | ORAL | 2 refills | Status: DC

## 2016-03-16 NOTE — Telephone Encounter (Signed)
Patient's daughter asked for me to call the patient with Cheryl's response earlier this morning so I called the patient and let her know Cheryl's instructions and about new rx being sent in for her.

## 2016-03-16 NOTE — Telephone Encounter (Signed)
Yes, increase the Glipizide to 2 pills.  Take at the same time. I put an order in the chart and medication to pharmacy

## 2016-03-16 NOTE — Telephone Encounter (Signed)
Routing to provider for advice.

## 2016-03-16 NOTE — Telephone Encounter (Signed)
Called and let patient's daughter know what Malachy Mood said. Patient's daughter stated that patient had asked about labs from last week because she had not heard anything. I let her know that Malachy Mood had sent the patient a letter and I read her what Malachy Mood said in the letter. Daughter asked that another copy be sent to the patient so I will put one in the mail. Daughter wants to know if it would be beneficial for the patient to take 2 glipizide tablets?

## 2016-03-16 NOTE — Telephone Encounter (Signed)
The Vit D levels haven't been checked for a little while and I am happy to add in if needed.  She will have to come in for that draw.  I would recommend instead taking about 5,000 IUs/day and we can check next visit.   Yes, her blood sugar being high can cause fatigue, expecially if going up and down.  She should focus on her diet.

## 2016-03-17 ENCOUNTER — Emergency Department
Admission: EM | Admit: 2016-03-17 | Discharge: 2016-03-17 | Disposition: A | Discharge status: Home / Self Care | Payer: Medicare Other | Attending: Student in an Organized Health Care Education/Training Program | Admitting: Student in an Organized Health Care Education/Training Program

## 2016-03-17 ENCOUNTER — Encounter: Payer: Self-pay | Admitting: Emergency Medicine

## 2016-03-17 DIAGNOSIS — I10 Essential (primary) hypertension: Secondary | ICD-10-CM | POA: Diagnosis not present

## 2016-03-17 DIAGNOSIS — Z7982 Long term (current) use of aspirin: Secondary | ICD-10-CM | POA: Diagnosis not present

## 2016-03-17 DIAGNOSIS — I4581 Long QT syndrome: Secondary | ICD-10-CM | POA: Diagnosis not present

## 2016-03-17 DIAGNOSIS — R5383 Other fatigue: Secondary | ICD-10-CM | POA: Diagnosis present

## 2016-03-17 DIAGNOSIS — E119 Type 2 diabetes mellitus without complications: Secondary | ICD-10-CM | POA: Insufficient documentation

## 2016-03-17 DIAGNOSIS — E039 Hypothyroidism, unspecified: Secondary | ICD-10-CM | POA: Insufficient documentation

## 2016-03-17 DIAGNOSIS — R55 Syncope and collapse: Secondary | ICD-10-CM | POA: Insufficient documentation

## 2016-03-17 DIAGNOSIS — Z7984 Long term (current) use of oral hypoglycemic drugs: Secondary | ICD-10-CM | POA: Diagnosis not present

## 2016-03-17 DIAGNOSIS — Z79899 Other long term (current) drug therapy: Secondary | ICD-10-CM | POA: Insufficient documentation

## 2016-03-17 DIAGNOSIS — Z9861 Coronary angioplasty status: Secondary | ICD-10-CM | POA: Diagnosis not present

## 2016-03-17 DIAGNOSIS — R9431 Abnormal electrocardiogram [ECG] [EKG]: Secondary | ICD-10-CM

## 2016-03-17 LAB — COMPREHENSIVE METABOLIC PANEL
ALT: 22 U/L (ref 14–54)
ANION GAP: 10 (ref 5–15)
AST: 23 U/L (ref 15–41)
Albumin: 4.2 g/dL (ref 3.5–5.0)
Alkaline Phosphatase: 66 U/L (ref 38–126)
BUN: 10 mg/dL (ref 6–20)
CHLORIDE: 100 mmol/L — AB (ref 101–111)
CO2: 26 mmol/L (ref 22–32)
Calcium: 9.5 mg/dL (ref 8.9–10.3)
Creatinine, Ser: 0.97 mg/dL (ref 0.44–1.00)
GFR, EST NON AFRICAN AMERICAN: 58 mL/min — AB (ref >60)
Glucose, Bld: 186 mg/dL — ABNORMAL HIGH (ref 65–99)
POTASSIUM: 3.6 mmol/L (ref 3.5–5.1)
Sodium: 136 mmol/L (ref 135–145)
Total Bilirubin: 0.5 mg/dL (ref 0.3–1.2)
Total Protein: 7.6 g/dL (ref 6.5–8.1)

## 2016-03-17 LAB — MAGNESIUM: MAGNESIUM: 1.6 mg/dL — AB (ref 1.7–2.4)

## 2016-03-17 MED ORDER — MAGNESIUM SULFATE 2 GM/50ML IV SOLN
2.0000 g | Freq: Once | INTRAVENOUS | Status: AC
  Administered 2016-03-17: 2 g via INTRAVENOUS
  Filled 2016-03-17: qty 50

## 2016-03-17 MED ORDER — MAGNESIUM 30 MG PO TABS: 30.0000 mg | ORAL_TABLET | Freq: Two times a day (BID) | ORAL | 0 refills | Status: AC

## 2016-03-17 NOTE — ED Provider Notes (Signed)
[facility] Emergency Department Provider Note    First MD Initiated Contact with Patient 03/17/16 1430     (approximate)  I have reviewed the triage vital signs and the nursing notes.   HISTORY  Chief Complaint Magnesium Infusion    HPI Jamie Murray is a 70 y.o. female who presents due to reported prolonged QT at outpatient cardiology clinic and low magnesium. Patient sent for IV magnesium infusion. Patient denies any chest pain or shortness of breath. States that over the past several months she's been having intermittent sweats as well as generalized fatigue and weakness. No lateralizing symptoms. Patient has been started on amitriptyline as well as escitalopram and Lopressor. She does have a history of heart disease and is currently wearing a Holter monitor. She denies any lower extremity swelling. Denies any fevers. Has had a normal oral intake. She denies any other complaints at this time.   Past Medical History:  Diagnosis Date  . Allergy   . Anemia   . Anxiety   . Depression   . Diabetes ([place])   . GERD (gastroesophageal reflux disease)   . Hyperlipidemia   . Hypertension   . Insomnia   . Migraines   . Osteoporosis   . [facility] spotted fever   . Transient cerebral ischemia   . Vertigo    every 2-3 months    Patient Active Problem List   Diagnosis Date Noted  . Macrocytosis 01/25/2016  . Diarrhea, functional 01/25/2016  . ARF (acute renal failure) ([place]) 12/22/2015  . Hypokalemia 12/22/2015  . Heartburn   . Hiatal hernia   . Stricture and stenosis of esophagus   . Gastroesophageal reflux disease without esophagitis 07/22/2015  . Urinary tract infection 04/28/2015  . Osteoporosis 01/26/2015  . Iron deficiency anemia 01/26/2015  . Insomnia 01/26/2015  . Allergic rhinitis 01/26/2015  . Anxiety and depression 01/26/2015  . Hypothyroidism 01/26/2015  . Migraine 01/26/2015  . Hypertension 01/26/2015  . Panic disorder  01/26/2015  . DM type 2 (diabetes mellitus, type 2) ([place]) 01/26/2015  . Hyperlipidemia 01/26/2015  . Vertigo 01/26/2015  . CVA (cerebral infarction) 01/26/2015  . Chronic tension-type headache, intractable 10/06/2014  . Dizziness 10/06/2014  . Essential (primary) hypertension 10/06/2014  . Disordered sleep 10/06/2014    Past Surgical History:  Procedure Laterality Date  . ABDOMINAL HYSTERECTOMY    . CORONARY ANGIOPLASTY    . ESOPHAGOGASTRODUODENOSCOPY (EGD) WITH PROPOFOL N/A 08/24/2015   Procedure: ESOPHAGOGASTRODUODENOSCOPY (EGD) WITH PROPOFOL with dialation;  Surgeon: Lucilla Lame, MD;  Location: [place];  Service: Endoscopy;  Laterality: N/A;  Diabetic - oral meds  . TUBAL LIGATION      Prior to Admission medications   Medication Sig Start Date End Date Taking? Authorizing Provider  amitriptyline (ELAVIL) 25 MG tablet Take 1 tablet (25 mg total) by mouth at bedtime. PRN sleep 12/22/15   Kathrine Haddock, NP  aspirin 81 MG tablet Take 81 mg by mouth daily.    Historical Provider, MD  Blood Glucose Monitoring Suppl ([place]) [place] See admin instructions. 02/23/16   Historical Provider, MD  clopidogrel (PLAVIX) 75 MG tablet Take 1 tablet (75 mg total) by mouth daily. 10/02/15   Kathrine Haddock, NP  Dulaglutide (TRULICITY) 1.5 KC/1.2XN SOPN Inject 1.5 mg into the skin once a week. 02/23/16   Kathrine Haddock, NP  EASY TOUCH TEST test strip USE TO CHECK BLOOD SUGAR ONCE DAILY 02/23/16   Historical Provider, MD  escitalopram (LEXAPRO) 20  MG tablet Take 1 tablet (20 mg total) by mouth daily. 05/18/15   Kathrine Haddock, NP  famotidine (PEPCID) 20 MG tablet Take 1 tablet (20 mg total) by mouth 2 (two) times daily. 12/28/15   Volney American, PA-C  Flaxseed, Linseed, (FLAX SEED OIL PO) Take by mouth 2 (two) times daily.    Historical Provider, MD  glipiZIDE (GLUCOTROL XL) 5 MG 24 hr tablet Take 2 tablets (10 mg total) by mouth daily with breakfast. 03/16/16   Kathrine Haddock, NP  LORazepam (ATIVAN) 0.5 MG tablet Take 1 tablet (0.5 mg total) by mouth daily as needed for anxiety. 03/07/16   Kathrine Haddock, NP  magnesium 30 MG tablet Take 1 tablet (30 mg total) by mouth 2 (two) times daily. 03/17/16 03/24/16  Merlyn Lot, MD  meclizine (ANTIVERT) 25 MG tablet Take 1 tablet (25 mg total) by mouth every 6 (six) hours as needed. Reported on 08/13/2015 Patient taking differently: Take 25 mg by mouth every 6 (six) hours as needed for dizziness. Reported on 08/13/2015 09/21/15   Kathrine Haddock, NP  metFORMIN (GLUCOPHAGE-XR) 500 MG 24 hr tablet Take 2 tablets (1,000 mg total) by mouth daily with breakfast. 01/25/16   Kathrine Haddock, NP  metoprolol succinate (TOPROL-XL) 50 MG 24 hr tablet Take 1 tablet (50 mg total) by mouth daily. Take with or immediately following a meal. 03/07/16   Kathrine Haddock, NP  Multiple Vitamin (MULTIVITAMIN) tablet Take 1 tablet by mouth daily.    Historical Provider, MD  naftifine (NAFTIN) 1 % cream Apply topically daily. 02/26/16   Edrick Kins, DPM  pantoprazole (PROTONIX) 40 MG tablet Take 1 tablet (40 mg total) by mouth 2 (two) times daily. 08/24/15   Lucilla Lame, MD  simvastatin (ZOCOR) 40 MG tablet Take 1 tablet (40 mg total) by mouth daily at 6 PM. 01/22/16   Kathrine Haddock, NP  terbinafine (LAMISIL) 250 MG tablet Take 1 tablet (250 mg total) by mouth daily. 02/26/16 03/25/16  Edrick Kins, DPM  valsartan-hydrochlorothiazide (DIOVAN-HCT) 320-25 MG tablet Take 1 tablet by mouth daily. DC Irbesartan 02/23/16   Kathrine Haddock, NP    Allergies Prozac [fluoxetine hcl]  Family History  Problem Relation Age of Onset  . Diabetes Mother   . Hypertension Mother   . Stroke Mother   . Heart disease Mother     MI  . Cancer Father     pancreatic  . Stroke Father   . Hypertension Sister   . Diabetes Brother   . Diabetes Maternal Grandfather   . Diabetes Sister   . Diabetes Sister   . Diabetes Brother   . Diabetes Brother     Social  History Social History  Substance Use Topics  . Smoking status: Never Smoker  . Smokeless tobacco: Never Used  . Alcohol use No    Review of Systems Patient denies headaches, rhinorrhea, blurry vision, numbness, shortness of breath, chest pain, edema, cough, abdominal pain, nausea, vomiting, diarrhea, dysuria, fevers, rashes or hallucinations unless otherwise stated above in HPI. ____________________________________________   PHYSICAL EXAM:  VITAL SIGNS: Vitals:   03/17/16 1221 03/17/16 1500  BP: 132/84 118/81  Pulse: 88 90  Resp: 16 (!) 22  Temp: 97.6 F (36.4 C)     Constitutional: Alert and oriented. Well appearing and in no acute distress. Eyes: Conjunctivae are normal. PERRL. EOMI. Head: Atraumatic. Nose: No congestion/rhinnorhea. Mouth/Throat: Mucous membranes are moist.  Oropharynx non-erythematous. Neck: No stridor. Painless ROM. No cervical spine tenderness to palpation Hematological/Lymphatic/Immunilogical:  No cervical lymphadenopathy. Cardiovascular: Normal rate, regular rhythm. Grossly normal heart sounds.  Good peripheral circulation. Respiratory: Normal respiratory effort.  No retractions. Lungs CTAB. Gastrointestinal: Soft and nontender. No distention. No abdominal bruits. No CVA tenderness. Genitourinary:  Musculoskeletal: No lower extremity tenderness nor edema.  No joint effusions. Neurologic:  Normal speech and language. No gross focal neurologic deficits are appreciated. No gait instability. Skin:  Skin is warm, dry and intact. No rash noted. Psychiatric: Mood and affect are normal. Speech and behavior are normal.  ____________________________________________   LABS (all labs ordered are listed, but only abnormal results are displayed)  Results for orders placed or performed during the hospital encounter of 03/17/16 (from the past 24 hour(s))  Comprehensive metabolic panel     Status: Abnormal   Collection Time: 03/17/16 12:26 PM  Result Value Ref  Range   Sodium 136 135 - 145 mmol/L   Potassium 3.6 3.5 - 5.1 mmol/L   Chloride 100 (L) 101 - 111 mmol/L   CO2 26 22 - 32 mmol/L   Glucose, Bld 186 (H) 65 - 99 mg/dL   BUN 10 6 - 20 mg/dL   Creatinine, Ser 0.97 0.44 - 1.00 mg/dL   Calcium 9.5 8.9 - 10.3 mg/dL   Total Protein 7.6 6.5 - 8.1 g/dL   Albumin 4.2 3.5 - 5.0 g/dL   AST 23 15 - 41 U/L   ALT 22 14 - 54 U/L   Alkaline Phosphatase 66 38 - 126 U/L   Total Bilirubin 0.5 0.3 - 1.2 mg/dL   GFR calc non Af Amer 58 (L) >60 mL/min   GFR calc Af Amer >60 >60 mL/min   Anion gap 10 5 - 15  Magnesium     Status: Abnormal   Collection Time: 03/17/16 12:26 PM  Result Value Ref Range   Magnesium 1.6 (L) 1.7 - 2.4 mg/dL   ____________________________________________  EKG My review and personal interpretation at Time:   16:16 Indication: hypomag  Rate: 80  Rhythm: nsr Axis: normal Other: normal intervals, no acute ischemia ____________________________________________  RADIOLOGY   ____________________________________________   PROCEDURES  Procedure(s) performed: none    Critical Care performed: no ____________________________________________   INITIAL IMPRESSION / ASSESSMENT AND PLAN / ED COURSE  Pertinent labs & imaging results that were available during my care of the patient were reviewed by me and considered in my medical decision making (see chart for details).  DDX: electrolye abn, medication effect, prolonged QT syndrome, brugada  Satcha Nadina Fomby is a 70 y.o. who presents to the ED with Prolonged QT and hypomagnesemia. Patient afebrile and hemodynamic stable. No other acute symptoms. We'll give IV magnesium infusion and repeat EKG. Patient will be kept on monitor. We'll check a CMP to evaluate for other associated electrolyte abnormality's.  Clinical Course  Comment By Time  Adhesive infusion is complete. Repeat EKG shows normal interval. No prolonged QT or dysrhythmia. Patient remains dynamically stable.  Patient stable for follow-up with Dr. Carren Rang.  Have discussed with the patient and available family all diagnostics and treatments performed thus far and all questions were answered to the best of my ability. The patient demonstrates understanding and agreement with plan.  Merlyn Lot, MD 10/12 1633     ____________________________________________   FINAL CLINICAL IMPRESSION(S) / ED DIAGNOSES  Final diagnoses:  Prolonged QT interval  Hypomagnesemia      NEW MEDICATIONS STARTED DURING THIS VISIT:  New Prescriptions   MAGNESIUM 30 MG TABLET    Take 1 tablet (30  mg total) by mouth 2 (two) times daily.     Note:  This document was prepared using Dragon voice recognition software and may include unintentional dictation errors.    Merlyn Lot, MD 03/17/16 [phone]

## 2016-03-17 NOTE — ED Triage Notes (Signed)
Seen by Dr. Chancy Milroy today and EKG done.  Prolonged QTC seen.  Patient sent to ED for IV magnesium.  Patient has holter monitor currently for c/o fatigue.

## 2016-03-22 ENCOUNTER — Encounter: Payer: Self-pay | Admitting: Unknown Physician Specialty

## 2016-03-22 ENCOUNTER — Ambulatory Visit (INDEPENDENT_AMBULATORY_CARE_PROVIDER_SITE_OTHER): Payer: Medicare Other | Admitting: Unknown Physician Specialty

## 2016-03-22 DIAGNOSIS — R42 Dizziness and giddiness: Secondary | ICD-10-CM

## 2016-03-22 DIAGNOSIS — R9431 Abnormal electrocardiogram [ECG] [EKG]: Secondary | ICD-10-CM | POA: Diagnosis not present

## 2016-03-22 DIAGNOSIS — F418 Other specified anxiety disorders: Secondary | ICD-10-CM | POA: Diagnosis not present

## 2016-03-22 DIAGNOSIS — F32A Depression, unspecified: Secondary | ICD-10-CM

## 2016-03-22 DIAGNOSIS — F329 Major depressive disorder, single episode, unspecified: Secondary | ICD-10-CM

## 2016-03-22 DIAGNOSIS — F419 Anxiety disorder, unspecified: Secondary | ICD-10-CM

## 2016-03-22 DIAGNOSIS — E538 Deficiency of other specified B group vitamins: Secondary | ICD-10-CM

## 2016-03-22 HISTORY — DX: Hypomagnesemia: E83.42

## 2016-03-22 MED ORDER — SERTRALINE HCL 50 MG PO TABS: 50.0000 mg | ORAL_TABLET | Freq: Every day | ORAL | 3 refills | Status: DC

## 2016-03-22 NOTE — Assessment & Plan Note (Signed)
Continue OTC supplements.  Switch to Magnesium Citrate if she can find it.

## 2016-03-22 NOTE — Assessment & Plan Note (Signed)
Change from Lexapro which has a higher risk of QT prolongation to [employer]

## 2016-03-22 NOTE — Progress Notes (Signed)
BP (!) 152/85 (BP Location: Left Arm, Patient Position: Sitting, Cuff Size: Large)   Pulse 89   Temp 98.3 F (36.8 C)   Ht 5' 5.9" (1.674 m)   Wt 182 lb (82.6 kg)   LMP  (LMP Unknown)   BMI 29.46 kg/m    Subjective:    Patient ID: Jamie Murray, female    DOB: 01/14/1946, 70 y.o.   MRN: [id number]  HPI: Jamie Murray is a 70 y.o. female  Chief Complaint  Patient presents with  . ER Follow Up   Pt found to have a prolonged QT interval when she went to cardiology.  Due to the prolonged QT and complaints of dizzyness and fainting, sent to the ER for IV Magnesium.  Now taking OTC Magnesium 250 mg BID.  She claims she wasn't really fainting, just dizzy which seems more vertigo related as opposed to central origin and feels no change in her symptoms.  She was taken off of Lexapro but will need something for her depression.  She is taking Melatonin to help her sleep instead of low dose Amitriptyline.    She states her stomach gets swollen and "big."    Relevant past medical, surgical, family and social history reviewed and updated as indicated. Interim medical history since our last visit reviewed. Allergies and medications reviewed and updated.  Review of Systems  Per HPI unless specifically indicated above     Objective:    BP (!) 152/85 (BP Location: Left Arm, Patient Position: Sitting, Cuff Size: Large)   Pulse 89   Temp 98.3 F (36.8 C)   Ht 5' 5.9" (1.674 m)   Wt 182 lb (82.6 kg)   LMP  (LMP Unknown)   BMI 29.46 kg/m   Wt Readings from Last 3 Encounters:  03/22/16 182 lb (82.6 kg)  03/17/16 180 lb (81.6 kg)  03/07/16 177 lb 9.6 oz (80.6 kg)    Physical Exam  Constitutional: She is oriented to person, place, and time. She appears well-developed and well-nourished. No distress.  HENT:  Head: Normocephalic and atraumatic.  Eyes: Conjunctivae and lids are normal. Right eye exhibits no discharge. Left eye exhibits no discharge. No scleral icterus.    Neck: Normal range of motion. Neck supple. No JVD present. Carotid bruit is not present.  Cardiovascular: Normal rate, regular rhythm and normal heart sounds.   Pulmonary/Chest: Effort normal and breath sounds normal.  Abdominal: Normal appearance. There is no splenomegaly or hepatomegaly.  Musculoskeletal: Normal range of motion.  Neurological: She is alert and oriented to person, place, and time.  Skin: Skin is warm, dry and intact. No rash noted. No pallor.  Psychiatric: She has a normal mood and affect. Her behavior is normal. Judgment and thought content normal.    Results for orders placed or performed during the hospital encounter of 03/17/16  Comprehensive metabolic panel  Result Value Ref Range   Sodium 136 135 - 145 mmol/L   Potassium 3.6 3.5 - 5.1 mmol/L   Chloride 100 (L) 101 - 111 mmol/L   CO2 26 22 - 32 mmol/L   Glucose, Bld 186 (H) 65 - 99 mg/dL   BUN 10 6 - 20 mg/dL   Creatinine, Ser 0.97 0.44 - 1.00 mg/dL   Calcium 9.5 8.9 - 10.3 mg/dL   Total Protein 7.6 6.5 - 8.1 g/dL   Albumin 4.2 3.5 - 5.0 g/dL   AST 23 15 - 41 U/L   ALT 22 14 - 54 U/L  Alkaline Phosphatase 66 38 - 126 U/L   Total Bilirubin 0.5 0.3 - 1.2 mg/dL   GFR calc non Af Amer 58 (L) >60 mL/min   GFR calc Af Amer >60 >60 mL/min   Anion gap 10 5 - 15  Magnesium  Result Value Ref Range   Magnesium 1.6 (L) 1.7 - 2.4 mg/dL      Assessment & Plan:   Problem List Items Addressed This Visit      Unprioritized   Anxiety and depression    Change from Lexapro which has a higher risk of QT prolongation to Sertraling      Hypomagnesemia    Continue OTC supplements.  Switch to Magnesium Citrate if she can find it.        Relevant Orders   Comprehensive metabolic panel   Magnesium   Prolonged Q-T interval on ECG    Resolved when went to the ER.  Will f/u with Dr. Humphrey Rolls      Relevant Orders   Comprehensive metabolic panel   Magnesium   Vertigo    Dizzyness seems peripheral.  Does not want to go  back to ENT       Other Visit Diagnoses   None.      Follow up plan: Return in about 6 weeks (around 05/03/2016), or if symptoms worsen or fail to improve, for evaluation of Sertraline.

## 2016-03-22 NOTE — Assessment & Plan Note (Signed)
Dizzyness seems peripheral.  Does not want to go back to ENT

## 2016-03-22 NOTE — Assessment & Plan Note (Signed)
Resolved when went to the ER.  Will f/u with Dr. Humphrey Rolls

## 2016-03-23 LAB — COMPREHENSIVE METABOLIC PANEL
A/G RATIO: 1.6 (ref 1.2–2.2)
ALT: 29 IU/L (ref 0–32)
AST: 22 IU/L (ref 0–40)
Albumin: 4.2 g/dL (ref 3.6–4.8)
Alkaline Phosphatase: 76 IU/L (ref 39–117)
BUN/Creatinine Ratio: 13 (ref 12–28)
BUN: 12 mg/dL (ref 8–27)
CHLORIDE: 97 mmol/L (ref 96–106)
CO2: 24 mmol/L (ref 18–29)
Calcium: 9.7 mg/dL (ref 8.7–10.3)
Creatinine, Ser: 0.91 mg/dL (ref 0.57–1.00)
GFR calc Af Amer: 74 mL/min/{1.73_m2} (ref >59)
GFR calc non Af Amer: 65 mL/min/{1.73_m2} (ref >59)
Globulin, Total: 2.6 g/dL (ref 1.5–4.5)
Glucose: 181 mg/dL — ABNORMAL HIGH (ref 65–99)
POTASSIUM: 4 mmol/L (ref 3.5–5.2)
Sodium: 140 mmol/L (ref 134–144)
Total Protein: 6.8 g/dL (ref 6.0–8.5)

## 2016-03-23 LAB — MAGNESIUM: MAGNESIUM: 1.6 mg/dL (ref 1.6–2.3)

## 2016-03-24 ENCOUNTER — Encounter: Payer: Self-pay | Admitting: Unknown Physician Specialty

## 2016-03-25 ENCOUNTER — Ambulatory Visit: Payer: Medicare Other | Admitting: Podiatry

## 2016-03-29 ENCOUNTER — Other Ambulatory Visit: Payer: Self-pay

## 2016-03-29 MED ORDER — VALSARTAN-HYDROCHLOROTHIAZIDE 320-25 MG PO TABS: 1.0000 | ORAL_TABLET | Freq: Every day | ORAL | 1 refills | Status: DC

## 2016-04-08 ENCOUNTER — Ambulatory Visit (INDEPENDENT_AMBULATORY_CARE_PROVIDER_SITE_OTHER): Payer: Medicare Other | Admitting: Podiatry

## 2016-04-08 DIAGNOSIS — B353 Tinea pedis: Secondary | ICD-10-CM

## 2016-04-09 NOTE — Progress Notes (Signed)
Subjective:  Patient presents today for follow-up evaluation of tinea pedis bilaterally. Patient states she's doing much better and almost healed. Patient presents today for further treatment and evaluation    Objective/Physical Exam General: The patient is alert and oriented x3 in no acute distress.  Dermatology: Skin is much improved from last visit. There is localized dermatitis noted to the medial longitudinal arches bilaterally. However this is much improved. Mild hyperkeratotic flaking skin noted. Skin is warm, dry and supple bilateral lower extremities. Negative for open lesions or macerations.  Vascular: Palpable pedal pulses bilaterally. No edema or erythema noted. Capillary refill within normal limits.  Neurological: Epicritic and protective threshold grossly intact bilaterally.   Musculoskeletal Exam: Range of motion within normal limits to all pedal and ankle joints bilateral. Muscle strength 5/5 in all groups bilateral.   Assessment: #1 tinea pedis bilateral feet-moccasin type #2 pruritus bilateral feet #3 pain in bilateral feet   Plan of Care:  #1 Patient was evaluated. #2 recommend continuing Naftin 2% gel apply daily to the bilateral feet. #3 recommend daily treatment over-the-counter Tinactin spray bilateral feet. #4 patient is to return to clinic when necessary   Dr. Edrick Kins, [place]

## 2016-04-12 ENCOUNTER — Ambulatory Visit: Payer: Medicare Other | Admitting: Unknown Physician Specialty

## 2016-04-22 ENCOUNTER — Other Ambulatory Visit: Payer: Self-pay | Admitting: Podiatry

## 2016-04-26 ENCOUNTER — Ambulatory Visit: Payer: Medicare Other | Admitting: Unknown Physician Specialty

## 2016-05-02 ENCOUNTER — Encounter: Payer: Self-pay | Admitting: Emergency Medicine

## 2016-05-02 ENCOUNTER — Emergency Department
Admission: EM | Admit: 2016-05-02 | Discharge: 2016-05-02 | Disposition: A | Discharge status: Home / Self Care | Payer: Medicare Other | Source: Home / Self Care | Attending: Emergency Medicine | Admitting: Emergency Medicine

## 2016-05-02 ENCOUNTER — Emergency Department
Admission: EM | Admit: 2016-05-02 | Discharge: 2016-05-03 | Disposition: A | Discharge status: Home / Self Care | Payer: Medicare Other | Attending: Emergency Medicine | Admitting: Emergency Medicine

## 2016-05-02 ENCOUNTER — Emergency Department: Payer: Medicare Other

## 2016-05-02 DIAGNOSIS — Z7982 Long term (current) use of aspirin: Secondary | ICD-10-CM | POA: Insufficient documentation

## 2016-05-02 DIAGNOSIS — E119 Type 2 diabetes mellitus without complications: Secondary | ICD-10-CM | POA: Insufficient documentation

## 2016-05-02 DIAGNOSIS — R42 Dizziness and giddiness: Secondary | ICD-10-CM

## 2016-05-02 DIAGNOSIS — R0602 Shortness of breath: Secondary | ICD-10-CM | POA: Insufficient documentation

## 2016-05-02 DIAGNOSIS — E039 Hypothyroidism, unspecified: Secondary | ICD-10-CM

## 2016-05-02 DIAGNOSIS — Z79899 Other long term (current) drug therapy: Secondary | ICD-10-CM | POA: Insufficient documentation

## 2016-05-02 DIAGNOSIS — I10 Essential (primary) hypertension: Secondary | ICD-10-CM | POA: Insufficient documentation

## 2016-05-02 DIAGNOSIS — Z7984 Long term (current) use of oral hypoglycemic drugs: Secondary | ICD-10-CM | POA: Insufficient documentation

## 2016-05-02 DIAGNOSIS — R51 Headache: Secondary | ICD-10-CM

## 2016-05-02 DIAGNOSIS — Z9861 Coronary angioplasty status: Secondary | ICD-10-CM | POA: Insufficient documentation

## 2016-05-02 LAB — URINALYSIS COMPLETE WITH MICROSCOPIC (ARMC ONLY)
BILIRUBIN URINE: NEGATIVE
GLUCOSE, UA: 50 mg/dL — AB
Hgb urine dipstick: NEGATIVE
Ketones, ur: NEGATIVE mg/dL
Nitrite: NEGATIVE
Protein, ur: NEGATIVE mg/dL
Specific Gravity, Urine: 1.01 (ref 1.005–1.030)
pH: 6 (ref 5.0–8.0)

## 2016-05-02 LAB — TROPONIN I
TROPONIN I: 0.03 ng/mL — AB (ref <0.03)
Troponin I: <0.03 ng/mL (ref <0.03)

## 2016-05-02 LAB — CBC WITH DIFFERENTIAL/PLATELET
BASOS PCT: 0 %
Basophils Absolute: 0 10*3/uL (ref 0–0.1)
EOS PCT: 1 %
Eosinophils Absolute: 0 10*3/uL (ref 0–0.7)
HCT: 33.9 % — ABNORMAL LOW (ref 35.0–47.0)
HEMOGLOBIN: 11 g/dL — AB (ref 12.0–16.0)
Lymphocytes Relative: 16 %
Lymphs Abs: 1 10*3/uL (ref 1.0–3.6)
MCH: 27.4 pg (ref 26.0–34.0)
MCHC: 32.5 g/dL (ref 32.0–36.0)
MCV: 84.2 fL (ref 80.0–100.0)
Monocytes Absolute: 0.3 10*3/uL (ref 0.2–0.9)
Monocytes Relative: 4 %
NEUTROS PCT: 79 %
Neutro Abs: 5.2 10*3/uL (ref 1.4–6.5)
PLATELETS: 282 10*3/uL (ref 150–440)
RBC: 4.03 MIL/uL (ref 3.80–5.20)
RDW: 16.4 % — ABNORMAL HIGH (ref 11.5–14.5)
WBC: 6.5 10*3/uL (ref 3.6–11.0)

## 2016-05-02 LAB — BASIC METABOLIC PANEL
Anion gap: 10 (ref 5–15)
Anion gap: 9 (ref 5–15)
BUN: 11 mg/dL (ref 6–20)
BUN: 13 mg/dL (ref 6–20)
CHLORIDE: 102 mmol/L (ref 101–111)
CHLORIDE: 105 mmol/L (ref 101–111)
CO2: 22 mmol/L (ref 22–32)
CO2: 26 mmol/L (ref 22–32)
CREATININE: 0.75 mg/dL (ref 0.44–1.00)
CREATININE: 0.94 mg/dL (ref 0.44–1.00)
Calcium: 9.2 mg/dL (ref 8.9–10.3)
Calcium: 9.3 mg/dL (ref 8.9–10.3)
GFR calc Af Amer: >60 mL/min (ref >60)
GFR calc non Af Amer: >60 mL/min (ref >60)
Glucose, Bld: 192 mg/dL — ABNORMAL HIGH (ref 65–99)
Glucose, Bld: 210 mg/dL — ABNORMAL HIGH (ref 65–99)
POTASSIUM: 3.4 mmol/L — AB (ref 3.5–5.1)
Potassium: 3.4 mmol/L — ABNORMAL LOW (ref 3.5–5.1)
SODIUM: 137 mmol/L (ref 135–145)
SODIUM: 137 mmol/L (ref 135–145)

## 2016-05-02 LAB — CBC
HCT: 35.3 % (ref 35.0–47.0)
Hemoglobin: 11.6 g/dL — ABNORMAL LOW (ref 12.0–16.0)
MCH: 27.6 pg (ref 26.0–34.0)
MCHC: 32.8 g/dL (ref 32.0–36.0)
MCV: 84.3 fL (ref 80.0–100.0)
PLATELETS: 295 10*3/uL (ref 150–440)
RBC: 4.19 MIL/uL (ref 3.80–5.20)
RDW: 16.6 % — AB (ref 11.5–14.5)
WBC: 7.3 10*3/uL (ref 3.6–11.0)

## 2016-05-02 LAB — BRAIN NATRIURETIC PEPTIDE: B NATRIURETIC PEPTIDE 5: 481 pg/mL — AB (ref 0.0–100.0)

## 2016-05-02 LAB — GLUCOSE, CAPILLARY: Glucose-Capillary: 230 mg/dL — ABNORMAL HIGH (ref 65–99)

## 2016-05-02 MED ORDER — LORAZEPAM 1 MG PO TABS
1.0000 mg | ORAL_TABLET | Freq: Once | ORAL | Status: AC
  Administered 2016-05-02: 1 mg via ORAL
  Filled 2016-05-02: qty 1

## 2016-05-02 MED ORDER — SODIUM CHLORIDE 0.9 % IV BOLUS (SEPSIS)
1000.0000 mL | Freq: Once | INTRAVENOUS | Status: AC
  Administered 2016-05-02: 1000 mL via INTRAVENOUS

## 2016-05-02 MED ORDER — DIAZEPAM 5 MG/ML IJ SOLN: 2.0000 mg | Freq: Once | INTRAMUSCULAR | Status: DC

## 2016-05-02 MED ORDER — MECLIZINE HCL 25 MG PO TABS
ORAL_TABLET | ORAL | Status: AC
  Filled 2016-05-02: qty 1

## 2016-05-02 MED ORDER — SODIUM CHLORIDE 0.9 % IV BOLUS (SEPSIS)
500.0000 mL | Freq: Once | INTRAVENOUS | Status: AC
  Administered 2016-05-02: 500 mL via INTRAVENOUS

## 2016-05-02 MED ORDER — MECLIZINE HCL 25 MG PO TABS
25.0000 mg | ORAL_TABLET | Freq: Once | ORAL | Status: AC
  Administered 2016-05-02: 25 mg via ORAL
  Filled 2016-05-02: qty 1

## 2016-05-02 MED ORDER — LORAZEPAM 2 MG/ML IJ SOLN
0.5000 mg | Freq: Once | INTRAMUSCULAR | Status: AC
  Administered 2016-05-02: 0.5 mg via INTRAVENOUS
  Filled 2016-05-02: qty 1

## 2016-05-02 NOTE — ED Notes (Signed)
Jamie Murray is at [facility] and ready for this patient.  Marcie Bal is taking her to MRI and Charge was notified.

## 2016-05-02 NOTE — ED Triage Notes (Signed)
Pt comes into the ED via EMS from home with c/o dizziness, pt was just seen last night for same and discharged home.Jamie Murray

## 2016-05-02 NOTE — ED Notes (Signed)
Patient states her PCP changed her BP medications last week and she has been taking the losartan without the HCTZ whereas before it was integrated with the losartan.   She also states her sugar at home has been higher than usual at home although those diabetic medications have not been changed.

## 2016-05-02 NOTE — ED Provider Notes (Signed)
[facility] Emergency Department Provider Note  ____________________________________________   First MD Initiated Contact with Patient 05/02/16 1828     (approximate)  I have reviewed the triage vital signs and the nursing notes.   HISTORY  Chief Complaint Dizziness   HPI Jamie Murray is a 70 y.o. female with a history of a coronary angioplasty as well as vertigo who is presenting to the emergency department with vertigo. She was seen in the emergency department yesterday because of an inability to ambulate. She was evaluated including a CT of the head which found no acute pathology. She was given Ativan and had resolution of her symptoms. However, upon discharge to home today she had 4 episodes of vomiting and continues to be vertiginous. She has been taking meclizine without any relief. She says that she also feels a pressure in her head, generalized, which is different from her baseline episodes of vertigo. Denies any weakness or numbness.   Past Medical History:  Diagnosis Date  . Allergy   . Anemia   . Anxiety   . Depression   . Diabetes ([place])   . GERD (gastroesophageal reflux disease)   . Hyperlipidemia   . Hypertension   . Insomnia   . Migraines   . Osteoporosis   . [facility] spotted fever   . Transient cerebral ischemia   . Vertigo    every 2-3 months    Patient Active Problem List   Diagnosis Date Noted  . Hypomagnesemia 03/22/2016  . Prolonged Q-T interval on ECG 03/22/2016  . Macrocytosis 01/25/2016  . Diarrhea, functional 01/25/2016  . ARF (acute renal failure) ([place]) 12/22/2015  . Hypokalemia 12/22/2015  . Heartburn   . Hiatal hernia   . Stricture and stenosis of esophagus   . Gastroesophageal reflux disease without esophagitis 07/22/2015  . Urinary tract infection 04/28/2015  . Osteoporosis 01/26/2015  . Iron deficiency anemia 01/26/2015  . Insomnia 01/26/2015  . Allergic rhinitis 01/26/2015  . Anxiety and  depression 01/26/2015  . Hypothyroidism 01/26/2015  . Migraine 01/26/2015  . Hypertension 01/26/2015  . Panic disorder 01/26/2015  . DM type 2 (diabetes mellitus, type 2) ([place]) 01/26/2015  . Hyperlipidemia 01/26/2015  . Vertigo 01/26/2015  . CVA (cerebral infarction) 01/26/2015  . Chronic tension-type headache, intractable 10/06/2014  . Dizziness 10/06/2014  . Essential (primary) hypertension 10/06/2014  . Disordered sleep 10/06/2014    Past Surgical History:  Procedure Laterality Date  . ABDOMINAL HYSTERECTOMY    . CORONARY ANGIOPLASTY    . ESOPHAGOGASTRODUODENOSCOPY (EGD) WITH PROPOFOL N/A 08/24/2015   Procedure: ESOPHAGOGASTRODUODENOSCOPY (EGD) WITH PROPOFOL with dialation;  Surgeon: Lucilla Lame, MD;  Location: [facility];  Service: Endoscopy;  Laterality: N/A;  Diabetic - oral meds  . TUBAL LIGATION      Prior to Admission medications   Medication Sig Start Date End Date Taking? Authorizing Provider  aspirin 81 MG tablet Take 81 mg by mouth daily.    Historical Provider, MD  Blood Glucose Monitoring Suppl ([place]) [place] See admin instructions. 02/23/16   Historical Provider, MD  clopidogrel (PLAVIX) 75 MG tablet Take 1 tablet (75 mg total) by mouth daily. 10/02/15   Kathrine Haddock, NP  Dulaglutide (TRULICITY) 1.5 TA/5.6PV SOPN Inject 1.5 mg into the skin once a week. 02/23/16   Kathrine Haddock, NP  EASY TOUCH TEST test strip USE TO CHECK BLOOD SUGAR ONCE DAILY 02/23/16   Historical Provider, MD  Flaxseed, Linseed, (FLAX SEED OIL PO) Take by  mouth 2 (two) times daily.    Historical Provider, MD  glipiZIDE (GLUCOTROL XL) 5 MG 24 hr tablet Take 2 tablets (10 mg total) by mouth daily with breakfast. 03/16/16   Kathrine Haddock, NP  LORazepam (ATIVAN) 0.5 MG tablet Take 1 tablet (0.5 mg total) by mouth daily as needed for anxiety. 03/07/16   Kathrine Haddock, NP  meclizine (ANTIVERT) 25 MG tablet Take 1 tablet (25 mg total) by mouth every 6 (six) hours as needed.  Reported on 08/13/2015 Patient taking differently: Take 25 mg by mouth every 6 (six) hours as needed for dizziness. Reported on 08/13/2015 09/21/15   Kathrine Haddock, NP  metFORMIN (GLUCOPHAGE-XR) 500 MG 24 hr tablet Take 2 tablets (1,000 mg total) by mouth daily with breakfast. 01/25/16   Kathrine Haddock, NP  Multiple Vitamin (MULTIVITAMIN) tablet Take 1 tablet by mouth daily.    Historical Provider, MD  naftifine (NAFTIN) 1 % cream Apply topically daily. 02/26/16   Edrick Kins, DPM  pantoprazole (PROTONIX) 40 MG tablet Take 1 tablet (40 mg total) by mouth 2 (two) times daily. 08/24/15   Lucilla Lame, MD  sertraline (ZOLOFT) 50 MG tablet Take 1 tablet (50 mg total) by mouth daily. 03/22/16   Kathrine Haddock, NP  simvastatin (ZOCOR) 40 MG tablet Take 1 tablet (40 mg total) by mouth daily at 6 PM. 01/22/16   Kathrine Haddock, NP  valsartan-hydrochlorothiazide (DIOVAN-HCT) 320-25 MG tablet Take 1 tablet by mouth daily. DC Irbesartan 03/29/16   Kathrine Haddock, NP    Allergies Prozac [fluoxetine hcl]  Family History  Problem Relation Age of Onset  . Diabetes Mother   . Hypertension Mother   . Stroke Mother   . Heart disease Mother     MI  . Cancer Father     pancreatic  . Stroke Father   . Hypertension Sister   . Diabetes Brother   . Diabetes Maternal Grandfather   . Diabetes Sister   . Diabetes Sister   . Diabetes Brother   . Diabetes Brother     Social History Social History  Substance Use Topics  . Smoking status: Never Smoker  . Smokeless tobacco: Never Used  . Alcohol use No    Review of Systems Constitutional: No fever/chills Eyes: No visual changes. ENT: No sore throat. Cardiovascular: Denies chest pain. Respiratory: Denies shortness of breath. Gastrointestinal: No abdominal pain.  No nausea, no vomiting.  No diarrhea.  No constipation. Genitourinary: Negative for dysuria. Musculoskeletal: Negative for back pain. Skin: Negative for rash. Neurological: Negative for headaches, focal  weakness or numbness.  10-point ROS otherwise negative.  ____________________________________________   PHYSICAL EXAM:  VITAL SIGNS: ED Triage Vitals  Enc Vitals Group     BP 05/02/16 1628 (!) 167/106     Pulse Rate 05/02/16 1628 80     Resp 05/02/16 1628 18     Temp 05/02/16 1628 98.4 F (36.9 C)     Temp Source 05/02/16 1628 Oral     SpO2 05/02/16 1628 98 %     Weight 05/02/16 1629 177 lb (80.3 kg)     Height 05/02/16 1629 _0  (1.702 m)     Head Circumference --      Peak Flow --      Pain Score --      Pain Loc --      Pain Edu? --      Excl. in [place]? --     Constitutional: Alert and oriented. Well appearing and in no acute  distress. Eyes: Conjunctivae are normal. PERRL. EOMI. Head: Atraumatic. Nose: No congestion/rhinnorhea. Mouth/Throat: Mucous membranes are moist.   Neck: No stridor.   Cardiovascular: Normal rate, regular rhythm. Grossly normal heart sounds.   Respiratory: Normal respiratory effort.  No retractions. Lungs CTAB. Gastrointestinal: Soft and nontender. No distention. No abdominal bruits. No CVA tenderness. Musculoskeletal: No lower extremity tenderness nor edema.  No joint effusions. Neurologic:  Normal speech and language. No gross focal neurologic deficits are appreciated. No ataxia on finger to nose testing and heel-to-shin testing. Skin:  Skin is warm, dry and intact. No rash noted. Psychiatric: Mood and affect are normal. Speech and behavior are normal.  ____________________________________________   LABS (all labs ordered are listed, but only abnormal results are displayed)  Labs Reviewed  CBC WITH DIFFERENTIAL/PLATELET - Abnormal; Notable for the following:       Result Value   Hemoglobin 11.0 (*)    HCT 33.9 (*)    RDW 16.4 (*)    All other components within normal limits  BASIC METABOLIC PANEL - Abnormal; Notable for the following:    Potassium 3.4 (*)    Glucose, Bld 192 (*)    All other components within normal limits  TROPONIN  I   ____________________________________________  EKG  ED ECG REPORT I, Doran Stabler, the attending physician, personally viewed and interpreted this ECG.   Date: 05/02/2016  EKG Time: 2040  Rate: 82  Rhythm: normal sinus rhythm  Axis: Normal axis  Intervals:none  ST&T Change: T wave inversions V2 through V5. No ST elevation or depression. No significant change from EKG done earlier today at 00 17. ____________________________________________  RADIOLOGY   ____________________________________________   PROCEDURES  Procedure(s) performed:   Procedures  Critical Care performed:   ____________________________________________   INITIAL IMPRESSION / ASSESSMENT AND PLAN / ED COURSE  Pertinent labs & imaging results that were available during my care of the patient were reviewed by me and considered in my medical decision making (see chart for details).   Clinical Course   ----------------------------------------- 10:55 PM on 05/02/2016 ----------------------------------------- Patient pending MRI results. Patient says that she does not want to stay in the hospital. Signed out to Dr. Quentin Cornwall. Patient will require reevaluation and also this patient will depend on the results of the MRI.   ____________________________________________   FINAL CLINICAL IMPRESSION(S) / ED DIAGNOSES  Vertigo.    NEW MEDICATIONS STARTED DURING THIS VISIT:  New Prescriptions   No medications on file     Note:  This document was prepared using Dragon voice recognition software and may include unintentional dictation errors.    Orbie Pyo, MD 05/02/16 [phone]

## 2016-05-02 NOTE — ED Notes (Signed)
Report off to [facility]

## 2016-05-02 NOTE — ED Notes (Signed)
Patient introduced to MRI Tech Barbaraann Rondo) and given phone to speak with him for her MRI screening.

## 2016-05-02 NOTE — ED Notes (Signed)
Pt brought in via ems from home.  Pt was discharged from er earlier today with similar sx.  Pt states she has had a high blood sugar and high blood pressure making her feel dizzy.  No headache.  No n/v/d.  Iv fluids infusing .  Pt in hallway bed.  Family with pt.  Pt alert.  Speech clear.

## 2016-05-02 NOTE — ED Notes (Signed)
Pt ambulatory, denies dizziness, sts "I feel much better."

## 2016-05-02 NOTE — ED Notes (Signed)
Barbaraann Rondo called asking information regarding this patient but I am assisting another RN with an in and out cath on her patient so I told him I would call him back after I had free hands.

## 2016-05-02 NOTE — ED Notes (Signed)
Barbaraann Rondo has finished with patient and told me he is on the way in and will call me when he arrives so we can complete the MRI.

## 2016-05-02 NOTE — ED Notes (Signed)
ED Provider at bedside. 

## 2016-05-02 NOTE — ED Notes (Signed)
Pharmacy says we are on national shortage of Valium, MD notified.

## 2016-05-02 NOTE — Discharge Instructions (Signed)
Follow-up with your primary care physician, please return with any other concerns

## 2016-05-02 NOTE — ED Triage Notes (Signed)
Per [facility], pt presents with near syncopal episode. Pt denies LOC or fall and is alert and oriented at this time in triage. Pt states that Jamie Murray has recently changed her BP medicine to Losartan, but otherwise no changes in medicine. Pt had a BP of 193/93, O2 of 100% RA, and HR of 85.

## 2016-05-02 NOTE — ED Provider Notes (Signed)
[facility] Emergency Department Provider Note   ____________________________________________   First MD Initiated Contact with Patient 05/02/16 [phone]     (approximate)  I have reviewed the triage vital signs and the nursing notes.   HISTORY  Chief Complaint Near Syncope    HPI Jamie Murray is a 70 y.o. female who comes into the hospital today with dizziness. The patient reports that her husband helped her to the bathroom but she reports that the dizziness continued. The patient reports that her head hit started hurting but that is currently improved. The patient reports that her blood pressure was high in the ambulance. It was 193/93. The patient's family states that her blood pressures been up all month and her doctor has been trying to bring him down. Her blood pressure has been 170s to 200s over 90s to 100s. The patient denies any chest pain but has had some shortness of breath. She denies cough and vomiting but did have some nausea. She's been feeling tired over the last few days and her blood sugar is also been high. The patient was recently started on hydralazine and valsartan for her blood pressure. The patient does have a history of vertigo and takes meclizine but reports that this dizziness and headache feels different from when she's had in the previous times. The patient is here today for evaluation.   Past Medical History:  Diagnosis Date  . Allergy   . Anemia   . Anxiety   . Depression   . Diabetes ([place])   . GERD (gastroesophageal reflux disease)   . Hyperlipidemia   . Hypertension   . Insomnia   . Migraines   . Osteoporosis   . [facility] spotted fever   . Transient cerebral ischemia   . Vertigo    every 2-3 months    Patient Active Problem List   Diagnosis Date Noted  . Hypomagnesemia 03/22/2016  . Prolonged Q-T interval on ECG 03/22/2016  . Macrocytosis 01/25/2016  . Diarrhea, functional 01/25/2016  . ARF (acute  renal failure) ([place]) 12/22/2015  . Hypokalemia 12/22/2015  . Heartburn   . Hiatal hernia   . Stricture and stenosis of esophagus   . Gastroesophageal reflux disease without esophagitis 07/22/2015  . Urinary tract infection 04/28/2015  . Osteoporosis 01/26/2015  . Iron deficiency anemia 01/26/2015  . Insomnia 01/26/2015  . Allergic rhinitis 01/26/2015  . Anxiety and depression 01/26/2015  . Hypothyroidism 01/26/2015  . Migraine 01/26/2015  . Hypertension 01/26/2015  . Panic disorder 01/26/2015  . DM type 2 (diabetes mellitus, type 2) ([place]) 01/26/2015  . Hyperlipidemia 01/26/2015  . Vertigo 01/26/2015  . CVA (cerebral infarction) 01/26/2015  . Chronic tension-type headache, intractable 10/06/2014  . Dizziness 10/06/2014  . Essential (primary) hypertension 10/06/2014  . Disordered sleep 10/06/2014    Past Surgical History:  Procedure Laterality Date  . ABDOMINAL HYSTERECTOMY    . CORONARY ANGIOPLASTY    . ESOPHAGOGASTRODUODENOSCOPY (EGD) WITH PROPOFOL N/A 08/24/2015   Procedure: ESOPHAGOGASTRODUODENOSCOPY (EGD) WITH PROPOFOL with dialation;  Surgeon: Lucilla Lame, MD;  Location: [place];  Service: Endoscopy;  Laterality: N/A;  Diabetic - oral meds  . TUBAL LIGATION      Prior to Admission medications   Medication Sig Start Date End Date Taking? Authorizing Provider  aspirin 81 MG tablet Take 81 mg by mouth daily.    Historical Provider, MD  Blood Glucose Monitoring Suppl ([place]) [place] See admin instructions. 02/23/16   Historical Provider,  MD  clopidogrel (PLAVIX) 75 MG tablet Take 1 tablet (75 mg total) by mouth daily. 10/02/15   Kathrine Haddock, NP  Dulaglutide (TRULICITY) 1.5 OZ/3.6UY SOPN Inject 1.5 mg into the skin once a week. 02/23/16   Kathrine Haddock, NP  EASY TOUCH TEST test strip USE TO CHECK BLOOD SUGAR ONCE DAILY 02/23/16   Historical Provider, MD  Flaxseed, Linseed, (FLAX SEED OIL PO) Take by mouth 2 (two) times daily.    Historical  Provider, MD  glipiZIDE (GLUCOTROL XL) 5 MG 24 hr tablet Take 2 tablets (10 mg total) by mouth daily with breakfast. 03/16/16   Kathrine Haddock, NP  LORazepam (ATIVAN) 0.5 MG tablet Take 1 tablet (0.5 mg total) by mouth daily as needed for anxiety. 03/07/16   Kathrine Haddock, NP  meclizine (ANTIVERT) 25 MG tablet Take 1 tablet (25 mg total) by mouth every 6 (six) hours as needed. Reported on 08/13/2015 Patient taking differently: Take 25 mg by mouth every 6 (six) hours as needed for dizziness. Reported on 08/13/2015 09/21/15   Kathrine Haddock, NP  metFORMIN (GLUCOPHAGE-XR) 500 MG 24 hr tablet Take 2 tablets (1,000 mg total) by mouth daily with breakfast. 01/25/16   Kathrine Haddock, NP  Multiple Vitamin (MULTIVITAMIN) tablet Take 1 tablet by mouth daily.    Historical Provider, MD  naftifine (NAFTIN) 1 % cream Apply topically daily. 02/26/16   Edrick Kins, DPM  pantoprazole (PROTONIX) 40 MG tablet Take 1 tablet (40 mg total) by mouth 2 (two) times daily. 08/24/15   Lucilla Lame, MD  sertraline (ZOLOFT) 50 MG tablet Take 1 tablet (50 mg total) by mouth daily. 03/22/16   Kathrine Haddock, NP  simvastatin (ZOCOR) 40 MG tablet Take 1 tablet (40 mg total) by mouth daily at 6 PM. 01/22/16   Kathrine Haddock, NP  valsartan-hydrochlorothiazide (DIOVAN-HCT) 320-25 MG tablet Take 1 tablet by mouth daily. DC Irbesartan 03/29/16   Kathrine Haddock, NP    Allergies Prozac [fluoxetine hcl]  Family History  Problem Relation Age of Onset  . Diabetes Mother   . Hypertension Mother   . Stroke Mother   . Heart disease Mother     MI  . Cancer Father     pancreatic  . Stroke Father   . Hypertension Sister   . Diabetes Brother   . Diabetes Maternal Grandfather   . Diabetes Sister   . Diabetes Sister   . Diabetes Brother   . Diabetes Brother     Social History Social History  Substance Use Topics  . Smoking status: Never Smoker  . Smokeless tobacco: Never Used  . Alcohol use No    Review of Systems Constitutional: No  fever/chills Eyes: No visual changes. ENT: No sore throat. Cardiovascular: Denies chest pain. Respiratory:  shortness of breath. Gastrointestinal: Nausea with No abdominal pain. no vomiting.  No diarrhea.  No constipation. Genitourinary: Negative for dysuria. Musculoskeletal: Negative for back pain. Skin: Negative for rash. Neurological: Dizziness and headache  10-point ROS otherwise negative.  ____________________________________________   PHYSICAL EXAM:  VITAL SIGNS: ED Triage Vitals  Enc Vitals Group     BP 05/02/16 0019 (!) 158/90     Pulse Rate 05/02/16 0019 83     Resp 05/02/16 0019 15     Temp 05/02/16 0019 98.2 F (36.8 C)     Temp Source 05/02/16 0019 Oral     SpO2 05/02/16 0019 99 %     Weight 05/02/16 0020 177 lb (80.3 kg)     Height 05/02/16 0020  _0  (1.702 m)     Head Circumference --      Peak Flow --      Pain Score 05/02/16 0020 0     Pain Loc --      Pain Edu? --      Excl. in [place]? --     Constitutional: Alert and oriented. Well appearing and in mild distress. Eyes: Conjunctivae are normal. PERRL. EOMI. Head: Atraumatic. Nose: No congestion/rhinnorhea. Mouth/Throat: Mucous membranes are moist.  Oropharynx non-erythematous. Cardiovascular: Normal rate, regular rhythm. Grossly normal heart sounds.  Good peripheral circulation. Respiratory: Normal respiratory effort.  No retractions. Lungs CTAB. Gastrointestinal: Soft and nontender. No distention. Positive bowel sound Musculoskeletal: No lower extremity tenderness nor edema.   Neurologic:  Normal speech and language. Cranial nerves II through XII grossly intact with no focal motor or neuro deficits Skin:  Skin is warm, dry and intact.  Psychiatric: Mood and affect are normal.   ____________________________________________   LABS (all labs ordered are listed, but only abnormal results are displayed)  Labs Reviewed  BASIC METABOLIC PANEL - Abnormal; Notable for the following:       Result Value     Potassium 3.4 (*)    Glucose, Bld 210 (*)    All other components within normal limits  CBC - Abnormal; Notable for the following:    Hemoglobin 11.6 (*)    RDW 16.6 (*)    All other components within normal limits  URINALYSIS COMPLETEWITH MICROSCOPIC ([facility] ONLY) - Abnormal; Notable for the following:    Color, Urine STRAW (*)    APPearance CLEAR (*)    Glucose, UA 50 (*)    Leukocytes, UA TRACE (*)    Bacteria, UA RARE (*)    Squamous Epithelial / LPF 0-5 (*)    All other components within normal limits  BRAIN NATRIURETIC PEPTIDE - Abnormal; Notable for the following:    B Natriuretic Peptide 481.0 (*)    All other components within normal limits  TROPONIN I - Abnormal; Notable for the following:    Troponin I 0.03 (*)    All other components within normal limits  GLUCOSE, CAPILLARY - Abnormal; Notable for the following:    Glucose-Capillary 230 (*)    All other components within normal limits  TROPONIN I  CBG MONITORING, ED   ____________________________________________  EKG  ED ECG REPORT I, Loney Hering, the attending physician, personally viewed and interpreted this ECG.   Date: 05/02/2016  EKG Time: 0017  Rate: 84  Rhythm: normal sinus rhythm  Axis: normal  Intervals:none  ST&T Change: ST depression in leads 2 and 3  ____________________________________________  RADIOLOGY  CXR CT head ____________________________________________   PROCEDURES  Procedure(s) performed: None  Procedures  Critical Care performed: No  ____________________________________________   INITIAL IMPRESSION / ASSESSMENT AND PLAN / ED COURSE  Pertinent labs & imaging results that were available during my care of the patient were reviewed by me and considered in my medical decision making (see chart for details).  This is a 70 year old female who comes into the hospital today with some dizziness. The patient's blood pressure is elevated for multiple weeks. I will  send the patient for CT scan of her head as well as a chest x-ray given her shortness of breath. The patient does sound like she has some crackles on her exam. The patient also has some ST depressions on her EKG. I will reassess the patient once I received the results of her imaging  studies as well as her blood work. I will also give the patient a dose of meclizine given her history.  Clinical Course as of May 02 806  [facility] May 02, 2016  0153 No active cardiopulmonary disease. DG Chest 2 View [AW]  2957 No acute intracranial abnormalities. CT Head Wo Contrast [AW]    Clinical Course User Index [AW] Loney Hering, MD   I did give the patient a dose of Ativan. Her dizziness is improved and she is able to walk without any difficulty. The patient's repeat troponin was 0.03 but she is not having any chest pain or any worsening symptoms. The patient will be discharged home to follow-up with her primary care physician.  ____________________________________________   FINAL CLINICAL IMPRESSION(S) / ED DIAGNOSES  Final diagnoses:  Dizziness      NEW MEDICATIONS STARTED DURING THIS VISIT:  Discharge Medication List as of 05/02/2016  6:31 AM       Note:  This document was prepared using Dragon voice recognition software and may include unintentional dictation errors.    Loney Hering, MD 05/02/16 [phone]

## 2016-05-03 ENCOUNTER — Telehealth: Payer: Self-pay | Admitting: Unknown Physician Specialty

## 2016-05-03 ENCOUNTER — Encounter: Payer: Self-pay | Admitting: Unknown Physician Specialty

## 2016-05-03 ENCOUNTER — Ambulatory Visit (INDEPENDENT_AMBULATORY_CARE_PROVIDER_SITE_OTHER): Payer: Medicare Other | Admitting: Unknown Physician Specialty

## 2016-05-03 VITALS — BP 159/79 | HR 74 | Temp 98.3°F | Wt 178.2 lb

## 2016-05-03 DIAGNOSIS — M810 Age-related osteoporosis without current pathological fracture: Secondary | ICD-10-CM

## 2016-05-03 DIAGNOSIS — R42 Dizziness and giddiness: Secondary | ICD-10-CM | POA: Diagnosis not present

## 2016-05-03 DIAGNOSIS — E1165 Type 2 diabetes mellitus with hyperglycemia: Secondary | ICD-10-CM | POA: Diagnosis not present

## 2016-05-03 DIAGNOSIS — N182 Chronic kidney disease, stage 2 (mild): Secondary | ICD-10-CM

## 2016-05-03 DIAGNOSIS — E1122 Type 2 diabetes mellitus with diabetic chronic kidney disease: Secondary | ICD-10-CM | POA: Diagnosis not present

## 2016-05-03 DIAGNOSIS — E538 Deficiency of other specified B group vitamins: Secondary | ICD-10-CM | POA: Diagnosis not present

## 2016-05-03 DIAGNOSIS — F32A Depression, unspecified: Secondary | ICD-10-CM | POA: Insufficient documentation

## 2016-05-03 DIAGNOSIS — F331 Major depressive disorder, recurrent, moderate: Secondary | ICD-10-CM

## 2016-05-03 DIAGNOSIS — F329 Major depressive disorder, single episode, unspecified: Secondary | ICD-10-CM | POA: Insufficient documentation

## 2016-05-03 DIAGNOSIS — I10 Essential (primary) hypertension: Secondary | ICD-10-CM | POA: Diagnosis not present

## 2016-05-03 DIAGNOSIS — R0602 Shortness of breath: Secondary | ICD-10-CM

## 2016-05-03 DIAGNOSIS — R5383 Other fatigue: Secondary | ICD-10-CM | POA: Diagnosis not present

## 2016-05-03 DIAGNOSIS — D7589 Other specified diseases of blood and blood-forming organs: Secondary | ICD-10-CM

## 2016-05-03 DIAGNOSIS — E039 Hypothyroidism, unspecified: Secondary | ICD-10-CM | POA: Diagnosis not present

## 2016-05-03 DIAGNOSIS — IMO0001 Reserved for inherently not codable concepts without codable children: Secondary | ICD-10-CM

## 2016-05-03 MED ORDER — METFORMIN HCL ER 500 MG PO TB24: 2000.0000 mg | ORAL_TABLET | Freq: Every day | ORAL | 3 refills | Status: DC

## 2016-05-03 MED ORDER — SERTRALINE HCL 50 MG PO TABS: 50.0000 mg | ORAL_TABLET | Freq: Every day | ORAL | 3 refills | Status: DC

## 2016-05-03 NOTE — Assessment & Plan Note (Signed)
Pt with electrolyte abnormalities.  Refer to Nephrology

## 2016-05-03 NOTE — Telephone Encounter (Signed)
Discussed with daughter

## 2016-05-03 NOTE — ED Provider Notes (Signed)
Patient received in signout from Dr. Clearnce Hasten pending MRI.  MRI shows no acute abnormality. Patient with hemodynamic stable and asymptomatic. Denies any chest pain or shortness of breath. Her troponin is negative and EKG shows no acute ischemia. Not clinically consistent with ACS. On further discussion with patient and family she is reporting decreased oral intake having only one meal daily. States that the dizziness is most frequently occurring at night when she gets up to use the bathroom. She states that she takes most of her blood pressure medications at night. I do suspect that this may be a component of temporary hypotension versus bradycardia from blood pressure medications. Patient currently stable and in no acute distress. She has follow-up with her primary care physician in the morning.  Have discussed with the patient and available family all diagnostics and treatments performed thus far and all questions were answered to the best of my ability. The patient demonstrates understanding and agreement with plan.    Merlyn Lot, MD 05/03/16 [phone]

## 2016-05-03 NOTE — Progress Notes (Signed)
BP (!) 159/79 (BP Location: Left Arm, Cuff Size: Normal)   Pulse 74   Temp 98.3 F (36.8 C)   Wt 178 lb 3.2 oz (80.8 kg)   LMP  (LMP Unknown)   BMI 27.91 kg/m    Subjective:    Patient ID: Jamie Murray, female    DOB: 05/06/1946, 70 y.o.   MRN: [id number]  HPI: Jamie Murray is a 70 y.o. female  Chief Complaint  Patient presents with  . Hypertension    pt states her BP has been running high  . Diabetes    pt states her sugar has been running high  . Dizziness    pt states this is a different kind of dizzy than she normally experiences   Pt is here for a f/u of Zoloft but never did pick it up.  Since she was here last, she has been to the ER for dizzyness which is different than her typical.  She states her dizzyness is such she has trouble moving. MRI in the ER was normal.  Magnesium infusion given in the ER.  ER notes reviewed.  In review of chart, we stopped Lexapro for her prolonged QT but did not start the Sertraline.    States the dizzyness comes and goes.  BS is high and up to around 200.  She would like to increase her Metfromin to 2,000 mg.   Last Hgb A1C was 7.8.  Saw Dr. Humphrey Rolls who changed BP meds to the Hydralazine.    Relevant past medical, surgical, family and social history reviewed and updated as indicated. Interim medical history since our last visit reviewed. Allergies and medications reviewed and updated.  Review of Systems  Constitutional: Positive for activity change and fatigue. Negative for unexpected weight change.  HENT: Negative.   Respiratory: Positive for shortness of breath.   Gastrointestinal: Negative.   Musculoskeletal: Positive for gait problem.       "walking like a drunk person  Neurological: Positive for dizziness.    Per HPI unless specifically indicated above     Objective:    BP (!) 159/79 (BP Location: Left Arm, Cuff Size: Normal)   Pulse 74   Temp 98.3 F (36.8 C)   Wt 178 lb 3.2 oz (80.8 kg)   LMP  (LMP  Unknown)   BMI 27.91 kg/m   Wt Readings from Last 3 Encounters:  05/03/16 178 lb 3.2 oz (80.8 kg)  05/02/16 177 lb (80.3 kg)  05/02/16 177 lb (80.3 kg)    Physical Exam  Constitutional: She is oriented to person, place, and time. She appears well-developed and well-nourished. No distress.  HENT:  Head: Normocephalic and atraumatic.  Eyes: Conjunctivae and lids are normal. Right eye exhibits no discharge. Left eye exhibits no discharge. No scleral icterus.  Neck: Normal range of motion. Neck supple. No JVD present. Carotid bruit is not present.  Cardiovascular: Normal rate, regular rhythm and normal heart sounds.   Pulmonary/Chest: Effort normal and breath sounds normal.  Abdominal: Normal appearance. There is no splenomegaly or hepatomegaly.  Musculoskeletal: Normal range of motion.  Neurological: She is alert and oriented to person, place, and time.  Skin: Skin is warm, dry and intact. No rash noted. No pallor.  Psychiatric: She has a normal mood and affect. Her behavior is normal. Judgment and thought content normal.    Results for orders placed or performed during the hospital encounter of 05/02/16  CBC with Differential  Result Value Ref Range  WBC 6.5 3.6 - 11.0 K/uL   RBC 4.03 3.80 - 5.20 MIL/uL   Hemoglobin 11.0 (L) 12.0 - 16.0 g/dL   HCT 33.9 (L) 35.0 - 47.0 %   MCV 84.2 80.0 - 100.0 fL   MCH 27.4 26.0 - 34.0 pg   MCHC 32.5 32.0 - 36.0 g/dL   RDW 16.4 (H) 11.5 - 14.5 %   Platelets 282 150 - 440 K/uL   Neutrophils Relative % 79 %   Neutro Abs 5.2 1.4 - 6.5 K/uL   Lymphocytes Relative 16 %   Lymphs Abs 1.0 1.0 - 3.6 K/uL   Monocytes Relative 4 %   Monocytes Absolute 0.3 0.2 - 0.9 K/uL   Eosinophils Relative 1 %   Eosinophils Absolute 0.0 0 - 0.7 K/uL   Basophils Relative 0 %   Basophils Absolute 0.0 0 - 0.1 K/uL  Basic metabolic panel  Result Value Ref Range   Sodium 137 135 - 145 mmol/L   Potassium 3.4 (L) 3.5 - 5.1 mmol/L   Chloride 105 101 - 111 mmol/L    CO2 22 22 - 32 mmol/L   Glucose, Bld 192 (H) 65 - 99 mg/dL   BUN 11 6 - 20 mg/dL   Creatinine, Ser 0.75 0.44 - 1.00 mg/dL   Calcium 9.2 8.9 - 10.3 mg/dL   GFR calc non Af Amer >60 >60 mL/min   GFR calc Af Amer >60 >60 mL/min   Anion gap 10 5 - 15  Troponin I  Result Value Ref Range   Troponin I <0.03 <0.03 ng/mL      Assessment & Plan:   Problem List Items Addressed This Visit      Unprioritized   Depression    Start Zoloft that she didn't receive before.        Relevant Medications   sertraline (ZOLOFT) 50 MG tablet   Dizziness    Unclear etiology.  Seems to be associated with difficulty walking.  She has seen neurology. I think it is related to stopping Lexapro due to a prolonged QT and not starting the the prescribed Zoloft due to pharmacy confusion      DM type 2 (diabetes mellitus, type 2) (HCC)    Increase Metformin      Relevant Medications   valsartan (DIOVAN) 320 MG tablet   metFORMIN (GLUCOPHAGE-XR) 500 MG 24 hr tablet   Essential (primary) hypertension    Poor control.  Dr. Humphrey Rolls currently adjusting medication      Relevant Medications   hydrALAZINE (APRESOLINE) 100 MG tablet   metoprolol succinate (TOPROL-XL) 100 MG 24 hr tablet   valsartan (DIOVAN) 320 MG tablet   Hypertension   Relevant Medications   hydrALAZINE (APRESOLINE) 100 MG tablet   metoprolol succinate (TOPROL-XL) 100 MG 24 hr tablet   valsartan (DIOVAN) 320 MG tablet   Other Relevant Orders   Comprehensive metabolic panel   Hypomagnesemia    Pt with electrolyte abnormalities.  Refer to Nephrology      Relevant Orders   Ambulatory referral to Nephrology   Hypothyroidism   Relevant Medications   metoprolol succinate (TOPROL-XL) 100 MG 24 hr tablet   Other Relevant Orders   Thyroid Panel With TSH   Macrocytosis    Check CBC      Osteoporosis - Primary   Relevant Orders   VITAMIN D 25 Hydroxy (Vit-D Deficiency, Fractures)   PTH, intact (no Ca)    Other Visit Diagnoses     Fatigue, unspecified type  Relevant Orders   VITAMIN D 25 Hydroxy (Vit-D Deficiency, Fractures)   CBC with Differential/Platelet   SOB (shortness of breath)       Normal Spirometry and stress test is pending with Dr. Humphrey Rolls.     Relevant Orders   Spirometry with Graph (Completed)   Uncontrolled type 2 diabetes mellitus without complication, without long-term current use of insulin (HCC)       Relevant Medications   valsartan (DIOVAN) 320 MG tablet   metFORMIN (GLUCOPHAGE-XR) 500 MG 24 hr tablet      Encouraged to take a journal.    Follow up plan: Return in about 1 week (around 05/10/2016) for Wednesday if possible.  .  Look at blood sugar and Seratonin syndrome

## 2016-05-03 NOTE — Assessment & Plan Note (Signed)
>>  ASSESSMENT AND PLAN FOR DIZZINESS WRITTEN ON 05/03/2016  9:51 AM BY Kathrine Haddock, NP  Unclear etiology.  Seems to be associated with difficulty walking.  She has seen neurology. I think it is related to stopping Lexapro due to a prolonged QT and not starting the the prescribed Zoloft due to pharmacy confusion

## 2016-05-03 NOTE — Assessment & Plan Note (Signed)
Start Zoloft that she didn't receive before.

## 2016-05-03 NOTE — ED Notes (Signed)
24G LH IV removed clean dry and intact.  Was placed before I assumed care.

## 2016-05-03 NOTE — Assessment & Plan Note (Signed)
Poor control.  Dr. Humphrey Rolls currently adjusting medication

## 2016-05-03 NOTE — Assessment & Plan Note (Signed)
Increase Metformin

## 2016-05-03 NOTE — Telephone Encounter (Signed)
Pt's daughter called wanted to express concerns for pt. Stated pt has spent the last 2 nights in the ER. Pt's BP has been really high, pt has been seeing Dr. Chancy Milroy but her BP is still not coming down even after medication increase. Pt's blood sugar is running around 180. Stated she would like Cheryl to check pt's T3 and T4 levels if not previously checked. Stated pt needs nutrition management, pt is not eating well. Stated pt's appetite has decreased. Pt has only been taking 1000 mg of metformin per day for her diabetes. Anything changes or recommendations, can they be written down and sent home with the patient. The patient's daughters are going to set up a pill box for the patient. Please contact pt's daughter Barbaraann Rondo @ [phone] with any questions or issues. Pt's daughter is on pt's DPR. Thanks.

## 2016-05-03 NOTE — Assessment & Plan Note (Addendum)
Unclear etiology.  Seems to be associated with difficulty walking.  She has seen neurology. I think it is related to stopping Lexapro due to a prolonged QT and not starting the the prescribed Zoloft due to pharmacy confusion

## 2016-05-03 NOTE — Discharge Instructions (Signed)
Please be sure to drink plenty of fluids as well as make sure to have 3  adequate meals daily. Return for any worsening symptoms. Follow up with her PCP in the morning to further discuss blood pressure medication and ears current symptoms.

## 2016-05-03 NOTE — Telephone Encounter (Signed)
Routing to provider. Patient has an appointment scheduled for this morning at 8:45.

## 2016-05-03 NOTE — Assessment & Plan Note (Signed)
Check CBC 

## 2016-05-04 LAB — CBC WITH DIFFERENTIAL/PLATELET
BASOS ABS: 0 10*3/uL (ref 0.0–0.2)
Basos: 0 %
EOS (ABSOLUTE): 0.1 10*3/uL (ref 0.0–0.4)
Eos: 2 %
Hematocrit: 32.4 % — ABNORMAL LOW (ref 34.0–46.6)
Hemoglobin: 10.4 g/dL — ABNORMAL LOW (ref 11.1–15.9)
IMMATURE GRANULOCYTES: 0 %
Immature Grans (Abs): 0 10*3/uL (ref 0.0–0.1)
LYMPHS ABS: 1.1 10*3/uL (ref 0.7–3.1)
Lymphs: 20 %
MCH: 26.4 pg — ABNORMAL LOW (ref 26.6–33.0)
MCHC: 32.1 g/dL (ref 31.5–35.7)
MCV: 82 fL (ref 79–97)
MONOS ABS: 0.3 10*3/uL (ref 0.1–0.9)
Monocytes: 5 %
NEUTROS PCT: 73 %
Neutrophils Absolute: 3.8 10*3/uL (ref 1.4–7.0)
Platelets: 285 10*3/uL (ref 150–379)
RBC: 3.94 x10E6/uL (ref 3.77–5.28)
RDW: 15.9 % — AB (ref 12.3–15.4)
WBC: 5.3 10*3/uL (ref 3.4–10.8)

## 2016-05-04 LAB — THYROID PANEL WITH TSH
FREE THYROXINE INDEX: 2.4 (ref 1.2–4.9)
T3 UPTAKE RATIO: 26 % (ref 24–39)
T4 TOTAL: 9.1 ug/dL (ref 4.5–12.0)
TSH: 6.58 u[IU]/mL — AB (ref 0.450–4.500)

## 2016-05-04 LAB — COMPREHENSIVE METABOLIC PANEL
ALK PHOS: 65 IU/L (ref 39–117)
ALT: 33 IU/L — AB (ref 0–32)
AST: 29 IU/L (ref 0–40)
Albumin/Globulin Ratio: 1.7 (ref 1.2–2.2)
Albumin: 3.9 g/dL (ref 3.6–4.8)
BUN/Creatinine Ratio: 14 (ref 12–28)
BUN: 12 mg/dL (ref 8–27)
Bilirubin Total: 0.3 mg/dL (ref 0.0–1.2)
CALCIUM: 9.2 mg/dL (ref 8.7–10.3)
CO2: 19 mmol/L (ref 18–29)
CREATININE: 0.85 mg/dL (ref 0.57–1.00)
Chloride: 101 mmol/L (ref 96–106)
GFR calc Af Amer: 81 mL/min/{1.73_m2} (ref >59)
GFR, EST NON AFRICAN AMERICAN: 70 mL/min/{1.73_m2} (ref >59)
GLOBULIN, TOTAL: 2.3 g/dL (ref 1.5–4.5)
GLUCOSE: 177 mg/dL — AB (ref 65–99)
Potassium: 3.9 mmol/L (ref 3.5–5.2)
SODIUM: 138 mmol/L (ref 134–144)
Total Protein: 6.2 g/dL (ref 6.0–8.5)

## 2016-05-04 LAB — PARATHYROID HORMONE, INTACT (NO CA): PTH: 37 pg/mL (ref 15–65)

## 2016-05-04 LAB — VITAMIN D 25 HYDROXY (VIT D DEFICIENCY, FRACTURES): Vit D, 25-Hydroxy: 37.7 ng/mL (ref 30.0–100.0)

## 2016-05-06 LAB — SPECIMEN STATUS REPORT

## 2016-05-06 LAB — FERRITIN: FERRITIN: 36 ng/mL (ref 15–150)

## 2016-05-11 ENCOUNTER — Encounter: Payer: Self-pay | Admitting: Unknown Physician Specialty

## 2016-05-11 ENCOUNTER — Ambulatory Visit (INDEPENDENT_AMBULATORY_CARE_PROVIDER_SITE_OTHER): Payer: Medicare Other | Admitting: Unknown Physician Specialty

## 2016-05-11 VITALS — BP 143/86 | HR 76 | Temp 97.8°F | Wt 174.6 lb

## 2016-05-11 DIAGNOSIS — R5382 Chronic fatigue, unspecified: Secondary | ICD-10-CM | POA: Diagnosis not present

## 2016-05-11 DIAGNOSIS — D649 Anemia, unspecified: Secondary | ICD-10-CM | POA: Diagnosis not present

## 2016-05-11 DIAGNOSIS — F5101 Primary insomnia: Secondary | ICD-10-CM

## 2016-05-11 DIAGNOSIS — E782 Mixed hyperlipidemia: Secondary | ICD-10-CM | POA: Diagnosis not present

## 2016-05-11 DIAGNOSIS — I10 Essential (primary) hypertension: Secondary | ICD-10-CM

## 2016-05-11 NOTE — Progress Notes (Signed)
BP (!) 143/86 (BP Location: Left Arm, Patient Position: Sitting, Cuff Size: Normal)   Pulse 76   Temp 97.8 F (36.6 C)   Wt 174 lb 9.6 oz (79.2 kg)   LMP  (LMP Unknown)   SpO2 100%   BMI 27.35 kg/m    Subjective:    Patient ID: Jamie Murray, female    DOB: 1945-12-03, 70 y.o.   MRN: [id number]  HPI: Hollye Anisah Kuck is a 70 y.o. female  Chief Complaint  Patient presents with  . Follow-up   Pt is here to f/u on dizzyness. This has improved with the addition of Zoloft.  However, she continues to feel weak and tired with an elevated BP.  She states she can walk but tires easily and is planning to skip her daughter's graduation.  Her BP has been high with medication adjustments being done by Dr. Humphrey Rolls.  She is seeing him tomorrow.  Her home SBP BP readings have been running 140s - 170s  She takes them in the AM before any medications.    Diabetes- states her BS is high.  Reviewed her diet.  It seems she is drinking Boost in the AM.   She has lost some weight which confirms diet changes.    Relevant past medical, surgical, family and social history reviewed and updated as indicated. Interim medical history since our last visit reviewed. Allergies and medications reviewed and updated.  Review of Systems  Per HPI unless specifically indicated above     Objective:    BP (!) 143/86 (BP Location: Left Arm, Patient Position: Sitting, Cuff Size: Normal)   Pulse 76   Temp 97.8 F (36.6 C)   Wt 174 lb 9.6 oz (79.2 kg)   LMP  (LMP Unknown)   SpO2 100%   BMI 27.35 kg/m   Wt Readings from Last 3 Encounters:  05/11/16 174 lb 9.6 oz (79.2 kg)  05/03/16 178 lb 3.2 oz (80.8 kg)  05/02/16 177 lb (80.3 kg)    Physical Exam  Constitutional: She is oriented to person, place, and time. She appears well-developed and well-nourished. No distress.  HENT:  Head: Normocephalic and atraumatic.  Eyes: Conjunctivae and lids are normal. Right eye exhibits no discharge. Left eye  exhibits no discharge. No scleral icterus.  Neck: Normal range of motion. Neck supple. No JVD present. Carotid bruit is not present.  Cardiovascular: Normal rate, regular rhythm and normal heart sounds.   Pulmonary/Chest: Effort normal and breath sounds normal.  Abdominal: Normal appearance. There is no splenomegaly or hepatomegaly.  Musculoskeletal: Normal range of motion.  Neurological: She is alert and oriented to person, place, and time.  Skin: Skin is warm, dry and intact. No rash noted. No pallor.  Psychiatric: She has a normal mood and affect. Her behavior is normal. Judgment and thought content normal.    Results for orders placed or performed in visit on 05/03/16  VITAMIN D 25 Hydroxy (Vit-D Deficiency, Fractures)  Result Value Ref Range   Vit D, 25-Hydroxy 37.7 30.0 - 100.0 ng/mL  Comprehensive metabolic panel  Result Value Ref Range   Glucose 177 (H) 65 - 99 mg/dL   BUN 12 8 - 27 mg/dL   Creatinine, Ser 0.85 0.57 - 1.00 mg/dL   GFR calc non Af Amer 70 >59 mL/min/1.73   GFR calc Af Amer 81 >59 mL/min/1.73   BUN/Creatinine Ratio 14 12 - 28   Sodium 138 134 - 144 mmol/L   Potassium 3.9 3.5 - 5.2  mmol/L   Chloride 101 96 - 106 mmol/L   CO2 19 18 - 29 mmol/L   Calcium 9.2 8.7 - 10.3 mg/dL   Total Protein 6.2 6.0 - 8.5 g/dL   Albumin 3.9 3.6 - 4.8 g/dL   Globulin, Total 2.3 1.5 - 4.5 g/dL   Albumin/Globulin Ratio 1.7 1.2 - 2.2   Bilirubin Total 0.3 0.0 - 1.2 mg/dL   Alkaline Phosphatase 65 39 - 117 IU/L   AST 29 0 - 40 IU/L   ALT 33 (H) 0 - 32 IU/L  Thyroid Panel With TSH  Result Value Ref Range   TSH 6.580 (H) 0.450 - 4.500 uIU/mL   T4, Total 9.1 4.5 - 12.0 ug/dL   T3 Uptake Ratio 26 24 - 39 %   Free Thyroxine Index 2.4 1.2 - 4.9  CBC with Differential/Platelet  Result Value Ref Range   WBC 5.3 3.4 - 10.8 x10E3/uL   RBC 3.94 3.77 - 5.28 x10E6/uL   Hemoglobin 10.4 (L) 11.1 - 15.9 g/dL   Hematocrit 32.4 (L) 34.0 - 46.6 %   MCV 82 79 - 97 fL   MCH 26.4 (L) 26.6 -  33.0 pg   MCHC 32.1 31.5 - 35.7 g/dL   RDW 15.9 (H) 12.3 - 15.4 %   Platelets 285 150 - 379 x10E3/uL   Neutrophils 73 Not Estab. %   Lymphs 20 Not Estab. %   Monocytes 5 Not Estab. %   Eos 2 Not Estab. %   Basos 0 Not Estab. %   Neutrophils Absolute 3.8 1.4 - 7.0 x10E3/uL   Lymphocytes Absolute 1.1 0.7 - 3.1 x10E3/uL   Monocytes Absolute 0.3 0.1 - 0.9 x10E3/uL   EOS (ABSOLUTE) 0.1 0.0 - 0.4 x10E3/uL   Basophils Absolute 0.0 0.0 - 0.2 x10E3/uL   Immature Granulocytes 0 Not Estab. %   Immature Grans (Abs) 0.0 0.0 - 0.1 x10E3/uL  PTH, intact (no Ca)  Result Value Ref Range   PTH 37 15 - 65 pg/mL  Ferritin  Result Value Ref Range   Ferritin 36 15 - 150 ng/mL  Specimen status report  Result Value Ref Range   specimen status report Comment       Assessment & Plan:   Problem List Items Addressed This Visit      Unprioritized   Anemia    Unknown cause.  Ferritin level is normal      Relevant Orders   Celiac Ab tTG DGP TIgA   Ambulatory referral to Nephrology   Vitamin B12   Folate   Iron and TIBC   Chronic fatigue - Primary    Her neurological issues seem to be improved.  Her biggest problem is "overwhelming fatigue."   Will order rheumatology tests and check for celiac.        Relevant Orders   CK Total (and CKMB)   Celiac Ab tTG DGP TIgA   ANA w/Reflex   Ambulatory referral to Nephrology   Hyperlipidemia    Stop Simvistatin for 2 weeks to see if it helps.  Next visit, restart a different statin due to multiple drug interactions with Simvistatin.        Hypertension    Refer to nephrology due to mild anemia plus BP plus low magnesium      Relevant Orders   Ambulatory referral to Nephrology   Insomnia    Improved as she is able to sleep         Follow up plan: Return for in the  next 1-2 weeks.

## 2016-05-11 NOTE — Assessment & Plan Note (Signed)
Improved as she is able to sleep

## 2016-05-11 NOTE — Assessment & Plan Note (Signed)
Her neurological issues seem to be improved.  Her biggest problem is "overwhelming fatigue."   Will order rheumatology tests and check for celiac.

## 2016-05-11 NOTE — Assessment & Plan Note (Signed)
Refer to nephrology due to mild anemia plus BP plus low magnesium

## 2016-05-11 NOTE — Assessment & Plan Note (Signed)
Stop Simvistatin for 2 weeks to see if it helps.  Next visit, restart a different statin due to multiple drug interactions with Simvistatin.

## 2016-05-11 NOTE — Assessment & Plan Note (Signed)
Unknown cause.  Ferritin level is normal

## 2016-05-14 LAB — IRON AND TIBC
IRON: 40 ug/dL (ref 27–139)
Iron Saturation: 14 % — ABNORMAL LOW (ref 15–55)
Total Iron Binding Capacity: 288 ug/dL (ref 250–450)
UIBC: 248 ug/dL (ref 118–369)

## 2016-05-14 LAB — VITAMIN B12: VITAMIN B 12: 569 pg/mL (ref 232–1245)

## 2016-05-14 LAB — ANA W/REFLEX: Anti Nuclear Antibody(ANA): NEGATIVE

## 2016-05-14 LAB — CK TOTAL AND CKMB (NOT AT ARMC)
CK MB INDEX: 2.6 ng/mL (ref 0.0–5.3)
CK TOTAL: 70 U/L (ref 24–173)

## 2016-05-14 LAB — CELIAC AB TTG DGP TIGA
Antigliadin Abs, IgA: 2 units (ref 0–19)
Gliadin IgG: 2 units (ref 0–19)
IgA/Immunoglobulin A, Serum: 83 mg/dL — ABNORMAL LOW (ref 87–352)
Tissue Transglut Ab: <2 U/mL (ref 0–5)
Transglutaminase IgA: <2 U/mL (ref 0–3)

## 2016-05-14 LAB — FOLATE

## 2016-05-16 ENCOUNTER — Telehealth: Payer: Self-pay | Admitting: Unknown Physician Specialty

## 2016-05-16 NOTE — Telephone Encounter (Signed)
Discussed with pt lab results are normal.  Will check back in 2 weeks to see how she does off Simvistatin.

## 2016-05-16 NOTE — Progress Notes (Signed)
Patient notified of results by phone.

## 2016-05-23 ENCOUNTER — Encounter: Payer: Self-pay | Admitting: Unknown Physician Specialty

## 2016-05-23 ENCOUNTER — Ambulatory Visit (INDEPENDENT_AMBULATORY_CARE_PROVIDER_SITE_OTHER): Payer: Medicare Other | Admitting: Unknown Physician Specialty

## 2016-05-23 ENCOUNTER — Encounter (INDEPENDENT_AMBULATORY_CARE_PROVIDER_SITE_OTHER): Payer: Self-pay

## 2016-05-23 DIAGNOSIS — E1122 Type 2 diabetes mellitus with diabetic chronic kidney disease: Secondary | ICD-10-CM

## 2016-05-23 DIAGNOSIS — I10 Essential (primary) hypertension: Secondary | ICD-10-CM

## 2016-05-23 DIAGNOSIS — E782 Mixed hyperlipidemia: Secondary | ICD-10-CM

## 2016-05-23 DIAGNOSIS — N182 Chronic kidney disease, stage 2 (mild): Secondary | ICD-10-CM

## 2016-05-23 MED ORDER — DULAGLUTIDE 1.5 MG/0.5ML ~~LOC~~ SOAJ: 1.5000 mg | SUBCUTANEOUS | 3 refills | Status: DC

## 2016-05-23 NOTE — Assessment & Plan Note (Signed)
Per Dr. Humphrey Rolls

## 2016-05-23 NOTE — Assessment & Plan Note (Signed)
Stay off Simvistatin as feels better.  Will consider Atorvastatin next visit

## 2016-05-23 NOTE — Assessment & Plan Note (Addendum)
Increase Trulicity to 1.5.

## 2016-05-23 NOTE — Progress Notes (Signed)
BP 134/75 (BP Location: Left Arm, Patient Position: Sitting, Cuff Size: Normal)   Pulse 72   Temp 98.6 F (37 C)   Wt 174 lb (78.9 kg)   LMP  (LMP Unknown)   BMI 27.25 kg/m    Subjective:    Patient ID: Jamie Murray, female    DOB: [birth year], 70 y.o.   MRN: [id number]  HPI: Jamie Murray is a 70 y.o. female  Chief Complaint  Patient presents with  . Follow-up    pt is here for a f/up, states she feels a little better   Fatigue Last visit BP medication adjusted by Dr. Humphrey Rolls and I took her off Simvistatin.  Pt states she is 85.7% better.  Dizzyness is gone.  Is able to walk without too much SOB.  Notes from Dr. Humphrey Rolls was reviewed.  She is starting to walk in order to increase fitness.    Diabetes  Sugar is elevated to 190.    Relevant past medical, surgical, family and social history reviewed and updated as indicated. Interim medical history since our last visit reviewed. Allergies and medications reviewed and updated.  Review of Systems  Per HPI unless specifically indicated above     Objective:    BP 134/75 (BP Location: Left Arm, Patient Position: Sitting, Cuff Size: Normal)   Pulse 72   Temp 98.6 F (37 C)   Wt 174 lb (78.9 kg)   LMP  (LMP Unknown)   BMI 27.25 kg/m   Wt Readings from Last 3 Encounters:  05/23/16 174 lb (78.9 kg)  05/11/16 174 lb 9.6 oz (79.2 kg)  05/03/16 178 lb 3.2 oz (80.8 kg)    Physical Exam  Constitutional: She is oriented to person, place, and time. She appears well-developed and well-nourished. No distress.  HENT:  Head: Normocephalic and atraumatic.  Eyes: Conjunctivae and lids are normal. Right eye exhibits no discharge. Left eye exhibits no discharge. No scleral icterus.  Neck: Normal range of motion. Neck supple. No JVD present. Carotid bruit is not present.  Cardiovascular: Normal rate, regular rhythm and normal heart sounds.   Pulmonary/Chest: Effort normal and breath sounds normal.  Abdominal: Normal  appearance. There is no splenomegaly or hepatomegaly.  Musculoskeletal: Normal range of motion.  Neurological: She is alert and oriented to person, place, and time.  Skin: Skin is warm, dry and intact. No rash noted. No pallor.  Psychiatric: She has a normal mood and affect. Her behavior is normal. Judgment and thought content normal.    Results for orders placed or performed in visit on 05/11/16  CK Total (and CKMB)  Result Value Ref Range   Total CK 70 24 - 173 U/L   CK-MB Index 2.6 0.0 - 5.3 ng/mL  Celiac Ab tTG DGP TIgA  Result Value Ref Range   IgA/Immunoglobulin A, Serum 83 (L) 87 - 352 mg/dL   Antigliadin Abs, IgA 2 0 - 19 units   Gliadin IgG 2 0 - 19 units   Transglutaminase IgA <2 0 - 3 U/mL   Tissue Transglut Ab <2 0 - 5 U/mL  ANA w/Reflex  Result Value Ref Range   Anit Nuclear Antibody(ANA) Negative Negative  Vitamin B12  Result Value Ref Range   Vitamin B-12 569 232 - 1,245 pg/mL  Folate  Result Value Ref Range   Folate >20.0 >3.0 ng/mL  Iron and TIBC  Result Value Ref Range   Total Iron Binding Capacity 288 250 - 450 ug/dL   UIBC 248  118 - 369 ug/dL   Iron 40 27 - 139 ug/dL   Iron Saturation 14 (L) 15 - 55 %      Assessment & Plan:   Problem List Items Addressed This Visit      Unprioritized   DM type 2 (diabetes mellitus, type 2) ([place])    Increase Trulicity to 1.5.        Relevant Medications   Dulaglutide (TRULICITY) 1.5 PN/2.2ZY SOPN   Essential (primary) hypertension    Per Dr. Humphrey Rolls      Hyperlipidemia    Stay off Simvistatin as feels better.  Will consider Atorvastatin next visit          Follow up plan: Return in about 3 months (around 08/21/2016).

## 2016-06-07 ENCOUNTER — Encounter: Payer: Self-pay | Admitting: Unknown Physician Specialty

## 2016-06-07 ENCOUNTER — Ambulatory Visit (INDEPENDENT_AMBULATORY_CARE_PROVIDER_SITE_OTHER): Payer: Medicare Other | Admitting: Unknown Physician Specialty

## 2016-06-07 VITALS — BP 127/80 | HR 73 | Ht 67.0 in | Wt 171.0 lb

## 2016-06-07 DIAGNOSIS — E1122 Type 2 diabetes mellitus with diabetic chronic kidney disease: Secondary | ICD-10-CM | POA: Diagnosis not present

## 2016-06-07 DIAGNOSIS — E538 Deficiency of other specified B group vitamins: Secondary | ICD-10-CM

## 2016-06-07 DIAGNOSIS — I10 Essential (primary) hypertension: Secondary | ICD-10-CM | POA: Diagnosis not present

## 2016-06-07 DIAGNOSIS — E782 Mixed hyperlipidemia: Secondary | ICD-10-CM

## 2016-06-07 DIAGNOSIS — N182 Chronic kidney disease, stage 2 (mild): Secondary | ICD-10-CM

## 2016-06-07 LAB — BAYER DCA HB A1C WAIVED: HB A1C (BAYER DCA - WAIVED): 8.5 % — ABNORMAL HIGH (ref <7.0)

## 2016-06-07 NOTE — Assessment & Plan Note (Signed)
Stable, continue present medications.   

## 2016-06-07 NOTE — Assessment & Plan Note (Signed)
Hgb A1C is 8.5.  She will increase Trulicity and plans on walking.

## 2016-06-07 NOTE — Progress Notes (Signed)
BP 127/80   Pulse 73   Ht _0  (1.702 m)   Wt 171 lb (77.6 kg)   LMP  (LMP Unknown)   BMI 26.78 kg/m    Subjective:    Patient ID: Jamie Murray, female    DOB: 1946/01/14, 70 y.o.   MRN: [id number]  HPI: Jamie Murray is a 71 y.o. female  Chief Complaint  Patient presents with  . Follow-up   "I'm up to 90%"  Diabetes: Using medications without difficulties.  Starting with the higher dose trulicity today.   No hypoglycemic episodes No hyperglycemic episodes Feet problems: none Blood Sugars averaging: "running high" 159-200s eye exam within last year Last Hgb A1C: 7.8  Hypertension  Using medications without difficulty Average home BPs   Using medication without problems or lightheadedness No chest pain with exertion or shortness of breath No Edema  Elevated Cholesterol Using medications without problems No Muscle aches  Diet: Losing weight with a stricter diet Exercise: starting to walk  Relevant past medical, surgical, family and social history reviewed and updated as indicated. Interim medical history since our last visit reviewed. Allergies and medications reviewed and updated.  Review of Systems  All other systems reviewed and are negative.   Per HPI unless specifically indicated above     Objective:    BP 127/80   Pulse 73   Ht _1  (1.702 m)   Wt 171 lb (77.6 kg)   LMP  (LMP Unknown)   BMI 26.78 kg/m   Wt Readings from Last 3 Encounters:  06/07/16 171 lb (77.6 kg)  05/23/16 174 lb (78.9 kg)  05/11/16 174 lb 9.6 oz (79.2 kg)    Physical Exam  Constitutional: She is oriented to person, place, and time. She appears well-developed and well-nourished. No distress.  HENT:  Head: Normocephalic and atraumatic.  Eyes: Conjunctivae and lids are normal. Right eye exhibits no discharge. Left eye exhibits no discharge. No scleral icterus.  Neck: Normal range of motion. Neck supple. No JVD present. Carotid bruit is not present.    Cardiovascular: Normal rate, regular rhythm and normal heart sounds.   Pulmonary/Chest: Effort normal and breath sounds normal.  Abdominal: Normal appearance. There is no splenomegaly or hepatomegaly.  Musculoskeletal: Normal range of motion.  Neurological: She is alert and oriented to person, place, and time.  Skin: Skin is warm, dry and intact. No rash noted. No pallor.  Psychiatric: She has a normal mood and affect. Her behavior is normal. Judgment and thought content normal.    Results for orders placed or performed in visit on 05/11/16  CK Total (and CKMB)  Result Value Ref Range   Total CK 70 24 - 173 U/L   CK-MB Index 2.6 0.0 - 5.3 ng/mL  Celiac Ab tTG DGP TIgA  Result Value Ref Range   IgA/Immunoglobulin A, Serum 83 (L) 87 - 352 mg/dL   Antigliadin Abs, IgA 2 0 - 19 units   Gliadin IgG 2 0 - 19 units   Transglutaminase IgA <2 0 - 3 U/mL   Tissue Transglut Ab <2 0 - 5 U/mL  ANA w/Reflex  Result Value Ref Range   Anit Nuclear Antibody(ANA) Negative Negative  Vitamin B12  Result Value Ref Range   Vitamin B-12 569 232 - 1,245 pg/mL  Folate  Result Value Ref Range   Folate >20.0 >3.0 ng/mL  Iron and TIBC  Result Value Ref Range   Total Iron Binding Capacity 288 250 - 450 ug/dL  UIBC 248 118 - 369 ug/dL   Iron 40 27 - 139 ug/dL   Iron Saturation 14 (L) 15 - 55 %      Assessment & Plan:   Problem List Items Addressed This Visit      Unprioritized   DM type 2 (diabetes mellitus, type 2) ([place]) - Primary    Hgb A1C is 8.5.  She will increase Trulicity and plans on walking.        Relevant Orders   Bayer DCA Hb A1c Waived   Essential (primary) hypertension    Stable, continue present medications.        Hyperlipidemia    Stable, continue present medications.        Relevant Orders   Lipid Panel w/o Chol/HDL Ratio   Hypertension   Relevant Orders   Comprehensive metabolic panel       Follow up plan: Return in about 3 months (around  09/05/2016).

## 2016-06-08 ENCOUNTER — Telehealth: Payer: Self-pay | Admitting: Unknown Physician Specialty

## 2016-06-08 LAB — COMPREHENSIVE METABOLIC PANEL
ALBUMIN: 4.1 g/dL (ref 3.6–4.8)
ALK PHOS: 61 IU/L (ref 39–117)
ALT: 49 IU/L — ABNORMAL HIGH (ref 0–32)
AST: 34 IU/L (ref 0–40)
Albumin/Globulin Ratio: 1.6 (ref 1.2–2.2)
BUN / CREAT RATIO: 16 (ref 12–28)
BUN: 14 mg/dL (ref 8–27)
Bilirubin Total: 0.3 mg/dL (ref 0.0–1.2)
CALCIUM: 9.6 mg/dL (ref 8.7–10.3)
CO2: 20 mmol/L (ref 18–29)
CREATININE: 0.86 mg/dL (ref 0.57–1.00)
Chloride: 98 mmol/L (ref 96–106)
GFR, EST AFRICAN AMERICAN: 80 mL/min/{1.73_m2} (ref >59)
GFR, EST NON AFRICAN AMERICAN: 69 mL/min/{1.73_m2} (ref >59)
GLOBULIN, TOTAL: 2.5 g/dL (ref 1.5–4.5)
GLUCOSE: 176 mg/dL — AB (ref 65–99)
Potassium: 4.3 mmol/L (ref 3.5–5.2)
Sodium: 138 mmol/L (ref 134–144)
TOTAL PROTEIN: 6.6 g/dL (ref 6.0–8.5)

## 2016-06-08 LAB — LIPID PANEL W/O CHOL/HDL RATIO
CHOLESTEROL TOTAL: 213 mg/dL — AB (ref 100–199)
HDL: 37 mg/dL — ABNORMAL LOW (ref >39)
LDL CALC: 143 mg/dL — AB (ref 0–99)
Triglycerides: 164 mg/dL — ABNORMAL HIGH (ref 0–149)
VLDL CHOLESTEROL CAL: 33 mg/dL (ref 5–40)

## 2016-06-08 MED ORDER — DULAGLUTIDE 1.5 MG/0.5ML ~~LOC~~ SOAJ: 1.5000 mg | SUBCUTANEOUS | 3 refills | Status: DC

## 2016-06-08 MED ORDER — ATORVASTATIN CALCIUM 10 MG PO TABS: 10.0000 mg | ORAL_TABLET | Freq: Every day | ORAL | 3 refills | Status: DC

## 2016-06-08 NOTE — Telephone Encounter (Signed)
Discuss with pt about adding Atorvastatin as we stopped due to ? Simvastatin side effects.

## 2016-06-08 NOTE — Telephone Encounter (Signed)
Jamie Murray did you try to call this patient?

## 2016-06-08 NOTE — Telephone Encounter (Signed)
Patient said someone had tried to reach her from the office possibly Malachy Mood.    Thank You  Santiago Glad

## 2016-06-13 ENCOUNTER — Telehealth: Payer: Self-pay | Admitting: Unknown Physician Specialty

## 2016-06-13 NOTE — Telephone Encounter (Signed)
PA submitted and pending review with cover my meds.

## 2016-06-13 NOTE — Telephone Encounter (Signed)
Patient called stating the pharmacy needs prior auth on her Trulicity 1.5BW taken as once a week on a specific day. Her pharmacy is [facility]  Thank you Santiago Glad

## 2016-06-17 NOTE — Telephone Encounter (Signed)
Letter scanned into patient's chart stating that trulicity does not need PA because it is a covered medication under patient's plan. Form faxed back to pharmacy letting them know this.

## 2016-06-28 ENCOUNTER — Other Ambulatory Visit: Payer: Self-pay

## 2016-06-28 DIAGNOSIS — IMO0001 Reserved for inherently not codable concepts without codable children: Secondary | ICD-10-CM

## 2016-06-28 DIAGNOSIS — E1165 Type 2 diabetes mellitus with hyperglycemia: Principal | ICD-10-CM

## 2016-06-29 MED ORDER — GLIPIZIDE ER 5 MG PO TB24: 10.0000 mg | ORAL_TABLET | Freq: Every day | ORAL | 2 refills | Status: DC

## 2016-07-05 ENCOUNTER — Telehealth: Payer: Self-pay | Admitting: Unknown Physician Specialty

## 2016-07-05 DIAGNOSIS — R5382 Chronic fatigue, unspecified: Secondary | ICD-10-CM

## 2016-07-05 NOTE — Telephone Encounter (Signed)
That would be fine and I'm happy to make a referra.  But first I have a couple of other ideas.  I would like to send her for a sleep study and check her for Lyme's.

## 2016-07-05 NOTE — Telephone Encounter (Signed)
Called and spoke to patient. She stated she is still feeling tired and has no energy. States the B12 injections are not helping. She would like to be referred to [facility] for a second opinion about these problems she is having.

## 2016-07-06 NOTE — Telephone Encounter (Signed)
Called and let patient know about sleep study referral. I told her that I was not sure if our office would call her about this or the sleep study office would call her but I asked for her to give [place] a call if she does not hear anything within a week or two from either office. I also let her know that Malachy Mood wants to have a couple of labs checked on her for Lyme's. I asked for the patient to stop by whenever she could to have those drawn. Patient verbally understood.

## 2016-07-11 ENCOUNTER — Other Ambulatory Visit: Payer: Medicare Other

## 2016-07-11 ENCOUNTER — Ambulatory Visit
Admission: EM | Admit: 2016-07-11 | Discharge: 2016-07-11 | Disposition: A | Discharge status: Home / Self Care | Payer: Medicare Other | Attending: Family Medicine | Admitting: Family Medicine

## 2016-07-11 DIAGNOSIS — R9431 Abnormal electrocardiogram [ECG] [EKG]: Secondary | ICD-10-CM | POA: Insufficient documentation

## 2016-07-11 DIAGNOSIS — Z7982 Long term (current) use of aspirin: Secondary | ICD-10-CM | POA: Insufficient documentation

## 2016-07-11 DIAGNOSIS — Z7984 Long term (current) use of oral hypoglycemic drugs: Secondary | ICD-10-CM | POA: Diagnosis not present

## 2016-07-11 DIAGNOSIS — I10 Essential (primary) hypertension: Secondary | ICD-10-CM | POA: Insufficient documentation

## 2016-07-11 DIAGNOSIS — I160 Hypertensive urgency: Secondary | ICD-10-CM | POA: Diagnosis not present

## 2016-07-11 DIAGNOSIS — Z79899 Other long term (current) drug therapy: Secondary | ICD-10-CM | POA: Insufficient documentation

## 2016-07-11 DIAGNOSIS — E119 Type 2 diabetes mellitus without complications: Secondary | ICD-10-CM | POA: Insufficient documentation

## 2016-07-11 DIAGNOSIS — R5382 Chronic fatigue, unspecified: Secondary | ICD-10-CM

## 2016-07-11 DIAGNOSIS — I493 Ventricular premature depolarization: Secondary | ICD-10-CM | POA: Diagnosis not present

## 2016-07-11 LAB — GLUCOSE, CAPILLARY: Glucose-Capillary: 114 mg/dL — ABNORMAL HIGH (ref 65–99)

## 2016-07-11 MED ORDER — CLONIDINE HCL 0.1 MG PO TABS
0.1000 mg | ORAL_TABLET | Freq: Once | ORAL | Status: AC
  Administered 2016-07-11: 0.1 mg via ORAL

## 2016-07-11 MED ORDER — CLONIDINE HCL 0.2 MG PO TABS
0.2000 mg | ORAL_TABLET | Freq: Once | ORAL | Status: AC
  Administered 2016-07-11: 0.2 mg via ORAL

## 2016-07-11 MED ORDER — ACETAMINOPHEN 500 MG PO TABS
1000.0000 mg | ORAL_TABLET | Freq: Once | ORAL | Status: AC
  Administered 2016-07-11: 1000 mg via ORAL

## 2016-07-11 NOTE — ED Provider Notes (Signed)
[facility]    CSN: [id number] Arrival date & time: 07/11/16  1731     History   Chief Complaint Chief Complaint  Patient presents with  . Headache    HPI Jamie Murray is a 71 y.o. female.   72 yo female with a h/o hypertension and diabetes presents with a c/o headache for the past 3-4 hours and "just not feeling well". States headache is 5-6/10 and generalized.  Denies any vision changes, numbness/tingling, one-sided weakness, speech or swallowing difficulties, vomiting, photophobia, chest pain, shortness of breath, palpitations, fevers or chills. States she checked her blood pressure at home and it was high.     Headache  Pain location:  Generalized   Past Medical History:  Diagnosis Date  . Allergy   . Anemia   . Anxiety   . Depression   . Diabetes ([place])   . GERD (gastroesophageal reflux disease)   . Hyperlipidemia   . Hypertension   . Insomnia   . Migraines   . Osteoporosis   . [facility] spotted fever   . Transient cerebral ischemia   . Vertigo    every 2-3 months    Patient Active Problem List   Diagnosis Date Noted  . Chronic fatigue 05/11/2016  . Anemia 05/11/2016  . Depression 05/03/2016  . Hypomagnesemia 03/22/2016  . Prolonged Q-T interval on ECG 03/22/2016  . Macrocytosis 01/25/2016  . Diarrhea, functional 01/25/2016  . ARF (acute renal failure) ([place]) 12/22/2015  . Hypokalemia 12/22/2015  . Heartburn   . Hiatal hernia   . Stricture and stenosis of esophagus   . Gastroesophageal reflux disease without esophagitis 07/22/2015  . Urinary tract infection 04/28/2015  . Osteoporosis 01/26/2015  . Insomnia 01/26/2015  . Allergic rhinitis 01/26/2015  . Anxiety and depression 01/26/2015  . Hypothyroidism 01/26/2015  . Migraine 01/26/2015  . Hypertension 01/26/2015  . DM type 2 (diabetes mellitus, type 2) ([place]) 01/26/2015  . Hyperlipidemia 01/26/2015  . CVA (cerebral infarction) 01/26/2015  . Chronic tension-type  headache, intractable 10/06/2014  . Essential (primary) hypertension 10/06/2014  . Disordered sleep 10/06/2014    Past Surgical History:  Procedure Laterality Date  . ABDOMINAL HYSTERECTOMY    . CORONARY ANGIOPLASTY    . ESOPHAGOGASTRODUODENOSCOPY (EGD) WITH PROPOFOL N/A 08/24/2015   Procedure: ESOPHAGOGASTRODUODENOSCOPY (EGD) WITH PROPOFOL with dialation;  Surgeon: Lucilla Lame, MD;  Location: [facility];  Service: Endoscopy;  Laterality: N/A;  Diabetic - oral meds  . TUBAL LIGATION      OB History    No data available       Home Medications    Prior to Admission medications   Medication Sig Start Date End Date Taking? Authorizing Provider  aspirin 81 MG tablet Take 81 mg by mouth daily.   Yes Historical Provider, MD  atorvastatin (LIPITOR) 10 MG tablet Take 1 tablet (10 mg total) by mouth daily. 06/08/16  Yes Kathrine Haddock, NP  Blood Glucose Monitoring Suppl (GNP EASY TOUCH GLUCOSE METER) DEVI See admin instructions. 02/23/16  Yes Historical Provider, MD  Cholecalciferol (VITAMIN D3) 2000 units TABS Take 2,000 Units by mouth daily.   Yes Historical Provider, MD  clopidogrel (PLAVIX) 75 MG tablet Take 1 tablet (75 mg total) by mouth daily. 10/02/15  Yes Kathrine Haddock, NP  Dulaglutide (TRULICITY) 1.5 KC/0.0LK SOPN Inject 1.5 mg into the skin once a week. 06/08/16  Yes Kathrine Haddock, NP  EASY TOUCH TEST test strip USE TO CHECK BLOOD SUGAR ONCE DAILY 02/23/16  Yes Historical Provider,  MD  Flaxseed, Linseed, (FLAX SEED OIL PO) Take 1,000 mg by mouth 2 (two) times daily.    Yes Historical Provider, MD  glipiZIDE (GLUCOTROL XL) 5 MG 24 hr tablet Take 2 tablets (10 mg total) by mouth daily with breakfast. 06/29/16  Yes Kathrine Haddock, NP  hydrALAZINE (APRESOLINE) 100 MG tablet Take 100 mg by mouth 3 (three) times daily. 04/29/16  Yes Historical Provider, MD  LORazepam (ATIVAN) 0.5 MG tablet Take 1 tablet (0.5 mg total) by mouth daily as needed for anxiety. 03/07/16  Yes Kathrine Haddock, NP    Magnesium Oxide 250 MG TABS Take 250 mg by mouth daily.   Yes Historical Provider, MD  metFORMIN (GLUCOPHAGE-XR) 500 MG 24 hr tablet Take 4 tablets (2,000 mg total) by mouth daily with breakfast. 05/03/16  Yes Kathrine Haddock, NP  metoprolol succinate (TOPROL-XL) 100 MG 24 hr tablet Take 100 mg by mouth daily.  04/29/16  Yes Historical Provider, MD  Multiple Vitamin (MULTIVITAMIN) tablet Take 1 tablet by mouth daily.   Yes Historical Provider, MD  pantoprazole (PROTONIX) 40 MG tablet Take 1 tablet (40 mg total) by mouth 2 (two) times daily. 08/24/15  Yes Lucilla Lame, MD  sertraline (ZOLOFT) 50 MG tablet Take 1 tablet (50 mg total) by mouth daily. 05/03/16  Yes Kathrine Haddock, NP  valsartan (DIOVAN) 320 MG tablet Take 320 mg by mouth daily. 04/26/16  Yes Historical Provider, MD    Family History Family History  Problem Relation Age of Onset  . Diabetes Mother   . Hypertension Mother   . Stroke Mother   . Heart disease Mother     MI  . Cancer Father     pancreatic  . Stroke Father   . Hypertension Sister   . Diabetes Brother   . Diabetes Maternal Grandfather   . Diabetes Sister   . Diabetes Sister   . Diabetes Brother   . Diabetes Brother     Social History Social History  Substance Use Topics  . Smoking status: Never Smoker  . Smokeless tobacco: Never Used  . Alcohol use No     Allergies   Prozac [fluoxetine hcl]   Review of Systems Review of Systems  Neurological: Positive for headaches.     Physical Exam Triage Vital Signs ED Triage Vitals  Enc Vitals Group     BP 07/11/16 1800 (!) 210/86     Pulse Rate 07/11/16 1800 75     Resp 07/11/16 1800 20     Temp 07/11/16 1800 97.8 F (36.6 C)     Temp Source 07/11/16 1800 Oral     SpO2 07/11/16 1800 99 %     Weight 07/11/16 1801 180 lb (81.6 kg)     Height 07/11/16 1801 _0  (1.702 m)     Head Circumference --      Peak Flow --      Pain Score 07/11/16 1803 8     Pain Loc --      Pain Edu? --      Excl. in  [place]? --    No data found.   Updated Vital Signs BP (!) 205/100 (BP Location: Left Arm)   Pulse 75   Temp 97.8 F (36.6 C) (Oral)   Resp 20   Ht _1  (1.702 m)   Wt 180 lb (81.6 kg)   LMP  (LMP Unknown)   SpO2 99%   BMI 28.19 kg/m   Visual Acuity Right Eye Distance:   Left Eye Distance:  Bilateral Distance:    Right Eye Near:   Left Eye Near:    Bilateral Near:     Physical Exam  Constitutional: She is oriented to person, place, and time. She appears well-developed and well-nourished.  Non-toxic appearance. She does not have a sickly appearance. No distress.  Eyes: EOM are normal. Pupils are equal, round, and reactive to light.  Neck: Neck supple. No JVD present.  Cardiovascular: Normal rate, regular rhythm, normal heart sounds and intact distal pulses.   Pulmonary/Chest: Effort normal and breath sounds normal. No stridor. No respiratory distress. She has no wheezes. She has no rales.  Neurological: She is alert and oriented to person, place, and time. She displays normal reflexes. No cranial nerve deficit or sensory deficit. She exhibits normal muscle tone. Coordination normal.  Skin: She is not diaphoretic.  Psychiatric: She has a normal mood and affect. Her behavior is normal. Judgment and thought content normal.  Nursing note and vitals reviewed.    UC Treatments / Results  Labs (all labs ordered are listed, but only abnormal results are displayed) Labs Reviewed  GLUCOSE, CAPILLARY - Abnormal; Notable for the following:       Result Value   Glucose-Capillary 114 (*)    All other components within normal limits    EKG  EKG Interpretation None       Radiology No results found.  Procedures ED EKG Date/Time: 07/11/2016 9:29 PM Performed by: Norval Gable Authorized by: Norval Gable   ECG reviewed by ED Physician in the absence of a cardiologist: yes   Previous ECG:    Previous ECG:  Compared to current   Similarity:  No change Interpretation:     Interpretation: normal   Rate:    ECG rate assessment: normal   Rhythm:    Rhythm: sinus rhythm   Ectopy:    Ectopy: none   QRS:    QRS axis:  Normal Conduction:    Conduction: normal   ST segments:    ST segments:  Normal T waves:    T waves: normal     (including critical care time)  Medications Ordered in UC Medications  cloNIDine (CATAPRES) tablet 0.2 mg (0.2 mg Oral Given 07/11/16 1902)  acetaminophen (TYLENOL) tablet 1,000 mg (1,000 mg Oral Given 07/11/16 1910)  cloNIDine (CATAPRES) tablet 0.1 mg (0.1 mg Oral Given 07/11/16 1947)     Initial Impression / Assessment and Plan / UC Course  I have reviewed the triage vital signs and the nursing notes.  Pertinent labs & imaging results that were available during my care of the patient were reviewed by me and considered in my medical decision making (see chart for details).       Final Clinical Impressions(s) / UC Diagnoses   Final diagnoses:  Hypertensive urgency    New Prescriptions Discharge Medication List as of 07/11/2016  8:30 PM     1. ekg results (no change from prior) and diagnosis reviewed with patient 2. Patient given clonidine 0.39m x 1 and monitored with no improvement in blood pressure; subsequent, additional dose of 0.127mclonidine given again with no improvement on blood pressure; patient also given 1gm acetaminophen 3. Due to no improvement on blood pressure and risks of uncontrolled severely elevated blood pressure explained that I recommend patient go to ED for further evaluation and management;  [facility] Patient in stable condition. Report  called to charge RN at [facility].    Norval Gable, MD 07/11/16 2136

## 2016-07-11 NOTE — ED Triage Notes (Addendum)
Pt c/o headache that started about 3 hours ago, she says she just doesn't feel good. She feels the headache may be from high BP. 210/86 Manual 205/82  She also feels very dizzy.

## 2016-07-12 ENCOUNTER — Telehealth: Payer: Self-pay | Admitting: Unknown Physician Specialty

## 2016-07-12 NOTE — Telephone Encounter (Signed)
Routing to provider.  

## 2016-07-12 NOTE — Telephone Encounter (Signed)
Discussed with daughter about a second opinion for "figuring out what's wrong."  At the ER today for malignant hypertension.  Will review ER/hospital notes when she is discharged to determine optimal referral.  Nephrology appt is pending.

## 2016-07-13 ENCOUNTER — Telehealth: Payer: Self-pay | Admitting: Unknown Physician Specialty

## 2016-07-13 DIAGNOSIS — R5383 Other fatigue: Secondary | ICD-10-CM

## 2016-07-13 DIAGNOSIS — D649 Anemia, unspecified: Secondary | ICD-10-CM

## 2016-07-13 LAB — LYME AB/WESTERN BLOT REFLEX

## 2016-07-13 LAB — ROCKY MTN SPOTTED FVR ABS PNL(IGG+IGM)
RMSF IGG: NEGATIVE
RMSF IgM: 0.16 index (ref 0.00–0.89)

## 2016-07-13 NOTE — Telephone Encounter (Signed)
Pt needs an anemia panel.  Discussed her high blood pressure and feelings of fatigue.  She would like an internal medicine second opinion at [facility].  Will set that up.  She is due to see cardiology and nephrology this month

## 2016-07-14 ENCOUNTER — Other Ambulatory Visit: Payer: Medicare Other

## 2016-07-14 DIAGNOSIS — D649 Anemia, unspecified: Secondary | ICD-10-CM

## 2016-07-15 ENCOUNTER — Other Ambulatory Visit: Payer: Self-pay | Admitting: Unknown Physician Specialty

## 2016-07-15 LAB — FOLATE: Folate: >20 ng/mL (ref >3.0)

## 2016-07-15 LAB — IRON AND TIBC
Iron Saturation: 13 % — ABNORMAL LOW (ref 15–55)
Iron: 38 ug/dL (ref 27–139)
Total Iron Binding Capacity: 302 ug/dL (ref 250–450)
UIBC: 264 ug/dL (ref 118–369)

## 2016-07-15 LAB — FERRITIN: FERRITIN: 32 ng/mL (ref 15–150)

## 2016-07-15 LAB — VITAMIN B12: Vitamin B-12: 552 pg/mL (ref 232–1245)

## 2016-07-15 MED ORDER — ZOLPIDEM TARTRATE 10 MG PO TABS: 10.0000 mg | ORAL_TABLET | Freq: Every evening | ORAL | 1 refills | Status: DC | PRN

## 2016-07-20 LAB — HM DIABETES EYE EXAM

## 2016-07-21 ENCOUNTER — Encounter: Payer: Self-pay | Admitting: Unknown Physician Specialty

## 2016-07-28 ENCOUNTER — Other Ambulatory Visit: Payer: Self-pay

## 2016-07-29 MED ORDER — LORAZEPAM 0.5 MG PO TABS: 0.5000 mg | ORAL_TABLET | Freq: Every day | ORAL | 1 refills | Status: DC | PRN

## 2016-08-09 ENCOUNTER — Emergency Department: Payer: Medicare Other

## 2016-08-09 ENCOUNTER — Observation Stay
Admission: EM | Admit: 2016-08-09 | Discharge: 2016-08-11 | Disposition: A | Discharge status: Home / Self Care | Payer: Medicare Other | Attending: Internal Medicine | Admitting: Internal Medicine

## 2016-08-09 DIAGNOSIS — K219 Gastro-esophageal reflux disease without esophagitis: Secondary | ICD-10-CM | POA: Insufficient documentation

## 2016-08-09 DIAGNOSIS — D638 Anemia in other chronic diseases classified elsewhere: Secondary | ICD-10-CM | POA: Insufficient documentation

## 2016-08-09 DIAGNOSIS — E876 Hypokalemia: Secondary | ICD-10-CM | POA: Diagnosis not present

## 2016-08-09 DIAGNOSIS — E785 Hyperlipidemia, unspecified: Secondary | ICD-10-CM | POA: Diagnosis not present

## 2016-08-09 DIAGNOSIS — F419 Anxiety disorder, unspecified: Secondary | ICD-10-CM | POA: Insufficient documentation

## 2016-08-09 DIAGNOSIS — G4700 Insomnia, unspecified: Secondary | ICD-10-CM | POA: Diagnosis not present

## 2016-08-09 DIAGNOSIS — Z7984 Long term (current) use of oral hypoglycemic drugs: Secondary | ICD-10-CM | POA: Insufficient documentation

## 2016-08-09 DIAGNOSIS — Z7982 Long term (current) use of aspirin: Secondary | ICD-10-CM | POA: Insufficient documentation

## 2016-08-09 DIAGNOSIS — R079 Chest pain, unspecified: Secondary | ICD-10-CM | POA: Diagnosis present

## 2016-08-09 DIAGNOSIS — I10 Essential (primary) hypertension: Secondary | ICD-10-CM | POA: Insufficient documentation

## 2016-08-09 DIAGNOSIS — Z8673 Personal history of transient ischemic attack (TIA), and cerebral infarction without residual deficits: Secondary | ICD-10-CM | POA: Diagnosis not present

## 2016-08-09 DIAGNOSIS — E119 Type 2 diabetes mellitus without complications: Secondary | ICD-10-CM | POA: Insufficient documentation

## 2016-08-09 DIAGNOSIS — Z7902 Long term (current) use of antithrombotics/antiplatelets: Secondary | ICD-10-CM | POA: Insufficient documentation

## 2016-08-09 DIAGNOSIS — R112 Nausea with vomiting, unspecified: Secondary | ICD-10-CM | POA: Insufficient documentation

## 2016-08-09 DIAGNOSIS — Z79899 Other long term (current) drug therapy: Secondary | ICD-10-CM | POA: Diagnosis not present

## 2016-08-09 DIAGNOSIS — F329 Major depressive disorder, single episode, unspecified: Secondary | ICD-10-CM | POA: Diagnosis not present

## 2016-08-09 HISTORY — DX: Chest pain, unspecified: R07.9

## 2016-08-09 LAB — CBC
HCT: 32.1 % — ABNORMAL LOW (ref 35.0–47.0)
HEMOGLOBIN: 10.4 g/dL — AB (ref 12.0–16.0)
MCH: 27.1 pg (ref 26.0–34.0)
MCHC: 32.4 g/dL (ref 32.0–36.0)
MCV: 83.6 fL (ref 80.0–100.0)
Platelets: 223 10*3/uL (ref 150–440)
RBC: 3.84 MIL/uL (ref 3.80–5.20)
RDW: 16.4 % — ABNORMAL HIGH (ref 11.5–14.5)
WBC: 7 10*3/uL (ref 3.6–11.0)

## 2016-08-09 LAB — BASIC METABOLIC PANEL
ANION GAP: 11 (ref 5–15)
BUN: 7 mg/dL (ref 6–20)
CHLORIDE: 108 mmol/L (ref 101–111)
CO2: 22 mmol/L (ref 22–32)
Calcium: 8.9 mg/dL (ref 8.9–10.3)
Creatinine, Ser: 0.72 mg/dL (ref 0.44–1.00)
GFR calc Af Amer: >60 mL/min (ref >60)
Glucose, Bld: 89 mg/dL (ref 65–99)
POTASSIUM: 2.8 mmol/L — AB (ref 3.5–5.1)
SODIUM: 141 mmol/L (ref 135–145)

## 2016-08-09 LAB — TROPONIN I
TROPONIN I: 0.05 ng/mL — AB (ref <0.03)
Troponin I: <0.03 ng/mL (ref <0.03)

## 2016-08-09 MED ORDER — ADULT MULTIVITAMIN W/MINERALS CH
1.0000 | ORAL_TABLET | Freq: Every day | ORAL | Status: DC
  Administered 2016-08-10 – 2016-08-11 (×2): 1 via ORAL
  Filled 2016-08-09: qty 1

## 2016-08-09 MED ORDER — MAGNESIUM OXIDE 250 MG PO TABS: 250.0000 mg | ORAL_TABLET | Freq: Every day | ORAL | Status: DC

## 2016-08-09 MED ORDER — HYDRALAZINE HCL 50 MG PO TABS
100.0000 mg | ORAL_TABLET | Freq: Three times a day (TID) | ORAL | Status: DC
  Administered 2016-08-09 – 2016-08-11 (×5): 100 mg via ORAL
  Filled 2016-08-09 (×4): qty 2

## 2016-08-09 MED ORDER — IRBESARTAN 150 MG PO TABS
300.0000 mg | ORAL_TABLET | Freq: Every day | ORAL | Status: DC
Start: 2016-08-10 — End: 2016-08-11
  Administered 2016-08-10 – 2016-08-11 (×2): 300 mg via ORAL
  Filled 2016-08-09 (×3): qty 2

## 2016-08-09 MED ORDER — MORPHINE SULFATE (PF) 4 MG/ML IV SOLN: 2.0000 mg | INTRAVENOUS | Status: DC | PRN

## 2016-08-09 MED ORDER — POTASSIUM CHLORIDE ER 10 MEQ PO TBCR: 10.0000 meq | EXTENDED_RELEASE_TABLET | Freq: Two times a day (BID) | ORAL | 0 refills | Status: DC

## 2016-08-09 MED ORDER — HEPARIN SODIUM (PORCINE) 5000 UNIT/ML IJ SOLN
5000.0000 [IU] | Freq: Three times a day (TID) | INTRAMUSCULAR | Status: DC
  Administered 2016-08-10 – 2016-08-11 (×4): 5000 [IU] via SUBCUTANEOUS
  Filled 2016-08-09 (×4): qty 1

## 2016-08-09 MED ORDER — ASPIRIN 81 MG PO CHEW
81.0000 mg | CHEWABLE_TABLET | Freq: Every day | ORAL | Status: DC
  Administered 2016-08-10 – 2016-08-11 (×2): 81 mg via ORAL
  Filled 2016-08-09 (×2): qty 1

## 2016-08-09 MED ORDER — SODIUM CHLORIDE 0.9 % IV SOLN
30.0000 meq | Freq: Once | INTRAVENOUS | Status: AC
  Administered 2016-08-09: 30 meq via INTRAVENOUS
  Filled 2016-08-09: qty 15

## 2016-08-09 MED ORDER — POTASSIUM CHLORIDE CRYS ER 20 MEQ PO TBCR
40.0000 meq | EXTENDED_RELEASE_TABLET | Freq: Once | ORAL | Status: AC
  Administered 2016-08-09: 40 meq via ORAL
  Filled 2016-08-09: qty 2

## 2016-08-09 MED ORDER — METOPROLOL SUCCINATE ER 100 MG PO TB24
100.0000 mg | ORAL_TABLET | Freq: Every day | ORAL | Status: DC
  Administered 2016-08-10 – 2016-08-11 (×2): 100 mg via ORAL
  Filled 2016-08-09 (×2): qty 1

## 2016-08-09 MED ORDER — VITAMIN D 1000 UNITS PO TABS
2000.0000 [IU] | ORAL_TABLET | Freq: Every day | ORAL | Status: DC
  Administered 2016-08-10 – 2016-08-11 (×2): 2000 [IU] via ORAL
  Filled 2016-08-09 (×2): qty 2

## 2016-08-09 MED ORDER — SERTRALINE HCL 50 MG PO TABS
50.0000 mg | ORAL_TABLET | Freq: Every day | ORAL | Status: DC
  Administered 2016-08-10 – 2016-08-11 (×2): 50 mg via ORAL
  Filled 2016-08-09 (×2): qty 1

## 2016-08-09 MED ORDER — HYDRALAZINE HCL 50 MG PO TABS
ORAL_TABLET | ORAL | Status: AC
  Administered 2016-08-09: 100 mg via ORAL
  Filled 2016-08-09: qty 2

## 2016-08-09 MED ORDER — LORAZEPAM 0.5 MG PO TABS: 0.5000 mg | ORAL_TABLET | Freq: Every day | ORAL | Status: DC | PRN

## 2016-08-09 MED ORDER — ATORVASTATIN CALCIUM 10 MG PO TABS
10.0000 mg | ORAL_TABLET | Freq: Every day | ORAL | Status: DC
  Administered 2016-08-10 – 2016-08-11 (×2): 10 mg via ORAL
  Filled 2016-08-09 (×2): qty 1

## 2016-08-09 MED ORDER — SODIUM CHLORIDE 0.9 % IV SOLN
INTRAVENOUS | Status: DC
  Administered 2016-08-10: 01:00:00 via INTRAVENOUS

## 2016-08-09 MED ORDER — PANTOPRAZOLE SODIUM 40 MG PO TBEC
40.0000 mg | DELAYED_RELEASE_TABLET | Freq: Two times a day (BID) | ORAL | Status: DC
  Administered 2016-08-10 – 2016-08-11 (×4): 40 mg via ORAL
  Filled 2016-08-09 (×4): qty 1

## 2016-08-09 MED ORDER — CLOPIDOGREL BISULFATE 75 MG PO TABS
75.0000 mg | ORAL_TABLET | Freq: Every day | ORAL | Status: DC
  Administered 2016-08-10 – 2016-08-11 (×2): 75 mg via ORAL
  Filled 2016-08-09 (×2): qty 1

## 2016-08-09 MED ORDER — ACETAMINOPHEN 325 MG PO TABS
650.0000 mg | ORAL_TABLET | ORAL | Status: DC | PRN
  Administered 2016-08-10: 650 mg via ORAL
  Filled 2016-08-09: qty 2

## 2016-08-09 MED ORDER — ZOLPIDEM TARTRATE 5 MG PO TABS: 10.0000 mg | ORAL_TABLET | Freq: Every evening | ORAL | Status: DC | PRN

## 2016-08-09 MED ORDER — GI COCKTAIL ~~LOC~~
30.0000 mL | Freq: Four times a day (QID) | ORAL | Status: DC | PRN
  Filled 2016-08-09: qty 30

## 2016-08-09 NOTE — ED Notes (Signed)
Elevate troponin reported to Dr Joni Fears

## 2016-08-09 NOTE — ED Notes (Signed)
Awaiting pharmacy to send IV potassium

## 2016-08-09 NOTE — ED Triage Notes (Signed)
Pt arrived via ems for c/o chest pain - pt reports that she started with chest pressure around 2pm and then began with nausea and vomiting - hx of HTN and DM - pt was given Zofran by ems (vomited 4 times en route to er) - pt given 368m of asa and ntg x3 (relieved the pressure from a 10/10 to a 2/10

## 2016-08-09 NOTE — ED Notes (Signed)
Pt arrived via ems for c/o chest pain - pt reports that she started with chest pressure around 2pm and then began with nausea and vomiting - hx of HTN and DM - pt was given Zofran by ems (vomited 4 times en route to er) - pt given 321m of asa and ntg x3 (relieved the pressure from a 10/10 to a 2/10 - pt denies nausea at this time - reports that she has feeling of elephant sitting on her chest at this time

## 2016-08-09 NOTE — Discharge Instructions (Signed)
You were seen for chest pain. Your workup today was reassuring. As I explained to you that does not mean that you do not have heart disease. You may need further evaluation to ensure you do not have a serious heart problem. Therefore it is imperative that you follow up with your doctor in 1-2 days for further evaluation.   When should you call for help?  Call 911 if: You passed out (lost consciousness) or if you feel dizzy. You have difficulty breathing. You have symptoms of a heart attack. These may include: Chest pain or pressure, or a strange feeling in your chest. Indigestion. Sweating. Shortness of breath. Nausea or vomiting. Pain, pressure, or a strange feeling in your back, neck, jaw, or upper belly or in one or both shoulders or arms. Lightheadedness or sudden weakness. A fast or irregular heartbeat. After you call 911, the operator may tell you to chew 1 adult-strength or 2 to 4 low-dose aspirin. Wait for an ambulance. Do not try to drive yourself.   Call your doctor today if: You have any trouble breathing. Your chest pain gets worse. You are dizzy or lightheaded, or you feel like you may faint. You are not getting better as expected. You are having new or different chest pain  How can you care for yourself at home? Rest until you feel better. Take your medicine exactly as prescribed. Call your doctor if you think you are having a problem with your medicine. Do not drive after taking a prescription pain medicine.

## 2016-08-09 NOTE — H&P (Signed)
History and Physical   [facility] - [place] @ [facility] Admission History and Physical [employer], D.O.    Patient Name: Jamie Murray MR#: [id number] Date of Birth: November 10, 1945 Date of Admission: 08/09/2016  Referring MD/NP/PA: Dr. Alfred Levins Primary Care Physician: Kathrine Haddock, NP Patient coming from: Home Outpatient Specialists: Dr. Humphrey Rolls   Chief Complaint:  Chief Complaint  Patient presents with  . Chest Pain    HPI: Jamie Murray is a 71 y.o. female with a known history of Allergies, anemia, anxiety/depression, diabetes, GERD, hypertension, hyperlipidemia, insomnia, migraines, osteoporosis, TIA presents to the emergency department for evaluation of Chest pain.  Patient was in a usual state of health until this evening when she developed the sudden onset of nausea associated with central substernal pressure/heaviness which was localized, nonradiating. She did have one episode of vomiting. In the ambulance she received 1 full dose aspirin and 3 sublingual nitroglycerin with resolution of her pain. Patient denies fevers/chills, weakness, dizziness, shortness of breath, constipation, diarrhea, abdominal pain, dysuria/frequency, changes in mental status.   Of note she has recently had a catheterization and stress test. Reports several months of ongoing severe fatigue. She has an appointment with a nephrologist regarding refractory hypertension.   Otherwise there has been no change in status. Patient has been taking medication as prescribed and there has been no recent change in medication or diet.  No recent antibiotics.  There has been no recent illness, hospitalizations, travel or sick contacts.    ED Course: Patient received PO and IV potassium.  Review of Systems:  CONSTITUTIONAL: No fever/chills, fatigue, weakness, weight gain/loss, headache. EYES: No blurry or double vision. ENT: No tinnitus, postnasal drip, redness or soreness of the oropharynx. RESPIRATORY: No cough,  dyspnea, wheeze.  No hemoptysis.  CARDIOVASCULAR: Positive chest pain, negative palpitations, syncope, orthopnea. No lower extremity edema.  GASTROINTESTINAL: Positive nausea, vomiting, negative abdominal pain, diarrhea, constipation.  No hematemesis, melena or hematochezia. GENITOURINARY: No dysuria, frequency, hematuria. ENDOCRINE: No polyuria or nocturia. No heat or cold intolerance. HEMATOLOGY: No anemia, bruising, bleeding. INTEGUMENTARY: No rashes, ulcers, lesions. MUSCULOSKELETAL: No arthritis, gout, dyspnea. NEUROLOGIC: No numbness, tingling, ataxia, seizure-type activity, weakness. PSYCHIATRIC: No anxiety, depression, insomnia.   Past Medical History:  Diagnosis Date  . Allergy   . Anemia   . Anxiety   . Depression   . Diabetes ([place])   . GERD (gastroesophageal reflux disease)   . Hyperlipidemia   . Hypertension   . Insomnia   . Migraines   . Osteoporosis   . [facility] spotted fever   . Transient cerebral ischemia   . Vertigo    every 2-3 months    Past Surgical History:  Procedure Laterality Date  . ABDOMINAL HYSTERECTOMY    . CORONARY ANGIOPLASTY    . ESOPHAGOGASTRODUODENOSCOPY (EGD) WITH PROPOFOL N/A 08/24/2015   Procedure: ESOPHAGOGASTRODUODENOSCOPY (EGD) WITH PROPOFOL with dialation;  Surgeon: Lucilla Lame, MD;  Location: [facility];  Service: Endoscopy;  Laterality: N/A;  Diabetic - oral meds  . TUBAL LIGATION       reports that she has never smoked. She has never used smokeless tobacco. She reports that she does not drink alcohol or use drugs.  Allergies  Allergen Reactions  . Prozac [Fluoxetine Hcl] Other (See Comments)    "felt crazy"    Family History  Problem Relation Age of Onset  . Diabetes Mother   . Hypertension Mother   . Stroke Mother   . Heart disease Mother     MI  .  Cancer Father     pancreatic  . Stroke Father   . Hypertension Sister   . Diabetes Brother   . Diabetes Maternal Grandfather   . Diabetes Sister   .  Diabetes Sister   . Diabetes Brother   . Diabetes Brother    Family history has been reviewed and confirmed with patient.   Prior to Admission medications   Medication Sig Start Date End Date Taking? Authorizing Provider  aspirin 81 MG tablet Take 81 mg by mouth daily.   Yes Historical Provider, MD  atorvastatin (LIPITOR) 10 MG tablet Take 1 tablet (10 mg total) by mouth daily. 06/08/16  Yes Kathrine Haddock, NP  Blood Glucose Monitoring Suppl (GNP EASY TOUCH GLUCOSE METER) DEVI See admin instructions. 02/23/16  Yes Historical Provider, MD  Cholecalciferol (VITAMIN D3) 2000 units TABS Take 2,000 Units by mouth daily.   Yes Historical Provider, MD  clopidogrel (PLAVIX) 75 MG tablet Take 1 tablet (75 mg total) by mouth daily. 10/02/15  Yes Kathrine Haddock, NP  EASY TOUCH TEST test strip USE TO CHECK BLOOD SUGAR ONCE DAILY 02/23/16  Yes Historical Provider, MD  glipiZIDE (GLUCOTROL XL) 5 MG 24 hr tablet Take 2 tablets (10 mg total) by mouth daily with breakfast. 06/29/16  Yes Kathrine Haddock, NP  hydrALAZINE (APRESOLINE) 100 MG tablet Take 100 mg by mouth 3 (three) times daily. 04/29/16  Yes Historical Provider, MD  LORazepam (ATIVAN) 0.5 MG tablet Take 1 tablet (0.5 mg total) by mouth daily as needed for anxiety. 07/29/16  Yes Kathrine Haddock, NP  Magnesium Oxide 250 MG TABS Take 250 mg by mouth daily.   Yes Historical Provider, MD  metFORMIN (GLUCOPHAGE-XR) 500 MG 24 hr tablet Take 4 tablets (2,000 mg total) by mouth daily with breakfast. 05/03/16  Yes Kathrine Haddock, NP  metoprolol succinate (TOPROL-XL) 100 MG 24 hr tablet Take 100 mg by mouth daily.  04/29/16  Yes Historical Provider, MD  Multiple Vitamin (MULTIVITAMIN) tablet Take 1 tablet by mouth daily.   Yes Historical Provider, MD  pantoprazole (PROTONIX) 40 MG tablet Take 1 tablet (40 mg total) by mouth 2 (two) times daily. 08/24/15  Yes Lucilla Lame, MD  sertraline (ZOLOFT) 50 MG tablet Take 1 tablet (50 mg total) by mouth daily. 05/03/16  Yes Kathrine Haddock, NP  valsartan (DIOVAN) 320 MG tablet Take 320 mg by mouth daily. 04/26/16  Yes Historical Provider, MD  zolpidem (AMBIEN) 10 MG tablet Take 1 tablet (10 mg total) by mouth at bedtime as needed for sleep. 07/15/16 08/14/16 Yes Kathrine Haddock, NP  Dulaglutide (TRULICITY) 1.5 YS/0.6TK SOPN Inject 1.5 mg into the skin once a week. 06/08/16   Kathrine Haddock, NP  Flaxseed, Linseed, (FLAX SEED OIL PO) Take 1,000 mg by mouth 2 (two) times daily.     Historical Provider, MD  potassium chloride (KLOR-CON 10) 10 MEQ tablet Take 1 tablet (10 mEq total) by mouth 2 (two) times daily. 08/09/16   Rudene Re, MD    Physical Exam: Vitals:   08/09/16 1930 08/09/16 2000 08/09/16 2100 08/09/16 2130  BP: (!) 178/115 (!) 169/150 (!) 170/121 (!) 191/113  Pulse: 87 87 91 91  Resp: _0 Temp:      TempSrc:      SpO2: 99% 95% 95% 98%  Weight:      Height:        GENERAL: 71 y.o.-year-old female patient, well-developed, well-nourished lying in the bed in no acute distress.  Pleasant and cooperative.  HEENT: Head atraumatic, normocephalic. Pupils equal, round, reactive to light and accommodation. No scleral icterus. Extraocular muscles intact. Nares are patent. Oropharynx is clear. Mucus membranes moist. NECK: Supple, full range of motion. No JVD, no bruit heard. No thyroid enlargement, no tenderness, no cervical lymphadenopathy. CHEST: Normal breath sounds bilaterally. No wheezing, rales, rhonchi or crackles. No use of accessory muscles of respiration.  No reproducible chest wall tenderness.  CARDIOVASCULAR: S1, S2 normal. No murmurs, rubs, or gallops. Cap refill <2 seconds. Pulses intact distally.  ABDOMEN: Soft, nondistended, nontender. No rebound, guarding, rigidity. Normoactive bowel sounds present in all four quadrants. No organomegaly or mass. EXTREMITIES: No pedal edema, cyanosis, or clubbing. No calf tenderness or Homan's sign.  NEUROLOGIC: The patient is alert and oriented x 3. Cranial nerves  II through XII are grossly intact with no focal sensorimotor deficit. Muscle strength 5/5 in all extremities. Sensation intact. Gait not checked. PSYCHIATRIC:  Normal affect, mood, thought content. SKIN: Warm, dry, and intact without obvious rash, lesion, or ulcer.    Labs on Admission:  CBC:  Recent Labs Lab 08/09/16 1714  WBC 7.0  HGB 10.4*  HCT 32.1*  MCV 83.6  PLT 650   Basic Metabolic Panel:  Recent Labs Lab 08/09/16 1714  NA 141  K 2.8*  CL 108  CO2 22  GLUCOSE 89  BUN 7  CREATININE 0.72  CALCIUM 8.9   GFR: Estimated Creatinine Clearance: 69.9 mL/min (by C-G formula based on SCr of 0.72 mg/dL). Liver Function Tests: No results for input(s): AST, ALT, ALKPHOS, BILITOT, PROT, ALBUMIN in the last 168 hours. No results for input(s): LIPASE, AMYLASE in the last 168 hours. No results for input(s): AMMONIA in the last 168 hours. Coagulation Profile: No results for input(s): INR, PROTIME in the last 168 hours. Cardiac Enzymes:  Recent Labs Lab 08/09/16 1714 08/09/16 2016  TROPONINI <0.03 0.05*   BNP (last 3 results) No results for input(s): PROBNP in the last 8760 hours. HbA1C: No results for input(s): HGBA1C in the last 72 hours. CBG: No results for input(s): GLUCAP in the last 168 hours. Lipid Profile: No results for input(s): CHOL, HDL, LDLCALC, TRIG, CHOLHDL, LDLDIRECT in the last 72 hours. Thyroid Function Tests: No results for input(s): TSH, T4TOTAL, FREET4, T3FREE, THYROIDAB in the last 72 hours. Anemia Panel: No results for input(s): VITAMINB12, FOLATE, FERRITIN, TIBC, IRON, RETICCTPCT in the last 72 hours. Urine analysis:  Sepsis Labs: _0 (procalcitonin:4,lacticidven:4) )No results found for this or any previous visit (from the past 240 hour(s)).   Radiological Exams on Admission: Dg Chest 2 View  Result Date: 08/09/2016 CLINICAL DATA:  Central chest pain starting today EXAM: CHEST  2 VIEW COMPARISON:  05/02/2016 FINDINGS: Borderline  cardiomegaly. No infiltrate or pulmonary edema. Mild thoracic spine osteopenia. Minimal degenerative changes mid thoracic spine. IMPRESSION: No active cardiopulmonary disease. Electronically Signed   By: Lahoma Crocker M.D.   On: 08/09/2016 17:49    EKG: Normal sinus rhythm at 93 bpm with normal axis, diffuse T wave flattening and nonspecific ST-T wave changes.   Assessment/Plan  This is a 71 y.o. female with a history of Allergies, anemia, anxiety/depression, diabetes, GERD, hypertension, hyperlipidemia, insomnia, migraines, osteoporosis, TIA now being admitted with:  #. Chest pain, rule out ACS given presentation and multiple risk factors - Admit to observation with telemetry monitoring. - Trend troponins, check lipids and TSH. - Morphine, nitro, beta blocker, aspirin and statin ordered.  Continue Plavix. - Cardiology consultation has been requested of Dr. Humphrey Rolls.  #. Hypokalemia,  mild - Replace PO - Check mag, phos  #. Anemia, chronic, stable - Monitor CBC  #. History of anxiety/depression - Continue Zoloft, Ativan  #. H/o Diabetes - Accuchecks achs with RISS coverage - Heart healthy, carb controlled diet -Hold metformin, glipizide, Trulicity  #. History of GERD - Continue Protonix  #. History of hypertension - Continue hydralazine, metoprolol, Diovan  #. History of hyperlipidemia - Continue Lipitor  #. History of insomnia -Continue Ambien  #. History of TIA - Continue aspirin, Plavix  Admission status: Observation, telemetry IV Fluids: Hep-Lock Diet/Nutrition: Heart healthy, carb controlled, nothing by mouth after midnight Consults called: Cardiology  DVT Px: Heparin, SCDs and early ambulation. Code Status: Full Code  Disposition Plan: To home in less than 24 hours  All records are reviewed and case discussed with ED provider. Management plans discussed with the patient and/or family who express understanding and agree with plan of care.  Jai Steil D.O.  on 08/09/2016 at 10:34 PM Between 7am to 6pm - Pager - [phone] After 6pm go to [email or web address] - [employer] [facility] [place] [facility] Office [phone] CC: Primary care physician; Kathrine Haddock, NP   08/09/2016, 10:34 PM

## 2016-08-09 NOTE — ED Provider Notes (Signed)
-----------------------------------------   9:33 PM on 08/09/2016 -----------------------------------------  Patient's repeat troponin is 0.05. Given the patient's comorbidities along with concerning chest pain story this afternoon we will admit to the hospital for further treatment.   Harvest Dark, MD 08/09/16 2133

## 2016-08-09 NOTE — ED Provider Notes (Signed)
[facility] Emergency Department Provider Note  ____________________________________________  Time seen: Approximately 5:24 PM  I have reviewed the triage vital signs and the nursing notes.   HISTORY  Chief Complaint Chest Pain   HPI Jamie Murray is a 71 y.o. female with a history of diabetes, hypertension, hyperlipidemia, anxiety, anemia who presents for evaluation of chest pain. Patient reports thatshe was sitting watching TV this afternoon when she started to feel nauseous, had several episodes of NBNB emesis and developed chest pain. She describes the chest pain as an elephant sitting on her chest, severe, located in the center of her chest, constant, and non radiating, associated with dizziness, nausea, vomiting. She was concerned she was having a heart attack as she has a family history and her maternal side of the family. She called EMS. She received a full dose of aspirin and 3 sublingual nitros with resolution of her pain. Patient is seen by Dr. Humphrey Rolls, cardiology and tells me she has had many LHC although does not remember the last one. She tells me her stress test and cath were normal. She denies URI symptoms such as cough, congestion, fever. She is not a smoker.  Past Medical History:  Diagnosis Date  . Allergy   . Anemia   . Anxiety   . Depression   . Diabetes ([place])   . GERD (gastroesophageal reflux disease)   . Hyperlipidemia   . Hypertension   . Insomnia   . Migraines   . Osteoporosis   . [facility] spotted fever   . Transient cerebral ischemia   . Vertigo    every 2-3 months    Patient Active Problem List   Diagnosis Date Noted  . Chest pain, rule out acute myocardial infarction 08/09/2016  . Chronic fatigue 05/11/2016  . Anemia 05/11/2016  . Depression 05/03/2016  . Hypomagnesemia 03/22/2016  . Prolonged Q-T interval on ECG 03/22/2016  . Macrocytosis 01/25/2016  . Diarrhea, functional 01/25/2016  . ARF (acute renal  failure) ([place]) 12/22/2015  . Hypokalemia 12/22/2015  . Heartburn   . Hiatal hernia   . Stricture and stenosis of esophagus   . Gastroesophageal reflux disease without esophagitis 07/22/2015  . Urinary tract infection 04/28/2015  . Osteoporosis 01/26/2015  . Insomnia 01/26/2015  . Allergic rhinitis 01/26/2015  . Anxiety and depression 01/26/2015  . Hypothyroidism 01/26/2015  . Migraine 01/26/2015  . Hypertension 01/26/2015  . DM type 2 (diabetes mellitus, type 2) ([place]) 01/26/2015  . Hyperlipidemia 01/26/2015  . CVA (cerebral infarction) 01/26/2015  . Chronic tension-type headache, intractable 10/06/2014  . Essential (primary) hypertension 10/06/2014  . Disordered sleep 10/06/2014    Past Surgical History:  Procedure Laterality Date  . ABDOMINAL HYSTERECTOMY    . CORONARY ANGIOPLASTY    . ESOPHAGOGASTRODUODENOSCOPY (EGD) WITH PROPOFOL N/A 08/24/2015   Procedure: ESOPHAGOGASTRODUODENOSCOPY (EGD) WITH PROPOFOL with dialation;  Surgeon: Lucilla Lame, MD;  Location: [place];  Service: Endoscopy;  Laterality: N/A;  Diabetic - oral meds  . TUBAL LIGATION      Prior to Admission medications   Medication Sig Start Date End Date Taking? Authorizing Provider  aspirin 81 MG tablet Take 81 mg by mouth daily.   Yes Historical Provider, MD  atorvastatin (LIPITOR) 10 MG tablet Take 1 tablet (10 mg total) by mouth daily. 06/08/16  Yes Kathrine Haddock, NP  Blood Glucose Monitoring Suppl (GNP EASY TOUCH GLUCOSE METER) DEVI See admin instructions. 02/23/16  Yes Historical Provider, MD  Cholecalciferol (VITAMIN D3) 2000 units  TABS Take 2,000 Units by mouth daily.   Yes Historical Provider, MD  clopidogrel (PLAVIX) 75 MG tablet Take 1 tablet (75 mg total) by mouth daily. 10/02/15  Yes Kathrine Haddock, NP  EASY TOUCH TEST test strip USE TO CHECK BLOOD SUGAR ONCE DAILY 02/23/16  Yes Historical Provider, MD  glipiZIDE (GLUCOTROL XL) 5 MG 24 hr tablet Take 2 tablets (10 mg total) by mouth daily with  breakfast. 06/29/16  Yes Kathrine Haddock, NP  hydrALAZINE (APRESOLINE) 100 MG tablet Take 100 mg by mouth 3 (three) times daily. 04/29/16  Yes Historical Provider, MD  LORazepam (ATIVAN) 0.5 MG tablet Take 1 tablet (0.5 mg total) by mouth daily as needed for anxiety. 07/29/16  Yes Kathrine Haddock, NP  Magnesium Oxide 250 MG TABS Take 250 mg by mouth daily.   Yes Historical Provider, MD  metFORMIN (GLUCOPHAGE-XR) 500 MG 24 hr tablet Take 4 tablets (2,000 mg total) by mouth daily with breakfast. 05/03/16  Yes Kathrine Haddock, NP  metoprolol succinate (TOPROL-XL) 100 MG 24 hr tablet Take 100 mg by mouth daily.  04/29/16  Yes Historical Provider, MD  Multiple Vitamin (MULTIVITAMIN) tablet Take 1 tablet by mouth daily.   Yes Historical Provider, MD  pantoprazole (PROTONIX) 40 MG tablet Take 1 tablet (40 mg total) by mouth 2 (two) times daily. 08/24/15  Yes Lucilla Lame, MD  sertraline (ZOLOFT) 50 MG tablet Take 1 tablet (50 mg total) by mouth daily. 05/03/16  Yes Kathrine Haddock, NP  valsartan (DIOVAN) 320 MG tablet Take 320 mg by mouth daily. 04/26/16  Yes Historical Provider, MD  zolpidem (AMBIEN) 10 MG tablet Take 1 tablet (10 mg total) by mouth at bedtime as needed for sleep. 07/15/16 08/14/16 Yes Kathrine Haddock, NP  Dulaglutide (TRULICITY) 1.5 AC/1.6SA SOPN Inject 1.5 mg into the skin once a week. 06/08/16   Kathrine Haddock, NP  Flaxseed, Linseed, (FLAX SEED OIL PO) Take 1,000 mg by mouth 2 (two) times daily.     Historical Provider, MD  isosorbide mononitrate (IMDUR) 30 MG 24 hr tablet Take 1 tablet (30 mg total) by mouth daily. 08/11/16   Epifanio Lesches, MD  ondansetron (ZOFRAN) 4 MG tablet Take 1 tablet (4 mg total) by mouth every 8 (eight) hours as needed for nausea or vomiting. 08/10/16   Epifanio Lesches, MD  potassium chloride (KLOR-CON 10) 10 MEQ tablet Take 1 tablet (10 mEq total) by mouth 2 (two) times daily. 08/09/16   Rudene Re, MD    Allergies Prozac [fluoxetine hcl]  Family History  Problem  Relation Age of Onset  . Diabetes Mother   . Hypertension Mother   . Stroke Mother   . Heart disease Mother     MI  . Cancer Father     pancreatic  . Stroke Father   . Hypertension Sister   . Diabetes Brother   . Diabetes Maternal Grandfather   . Diabetes Sister   . Diabetes Sister   . Diabetes Brother   . Diabetes Brother     Social History Social History  Substance Use Topics  . Smoking status: Never Smoker  . Smokeless tobacco: Never Used  . Alcohol use No    Review of Systems  Constitutional: Negative for fever. + dizziness Eyes: Negative for visual changes. ENT: Negative for sore throat. Neck: No neck pain  Cardiovascular: + chest pain. Respiratory: Negative for shortness of breath. Gastrointestinal: Negative for abdominal pain, diarrhea. + N.V Genitourinary: Negative for dysuria. Musculoskeletal: Negative for back pain. Skin: Negative for rash.  Neurological: Negative for headaches, weakness or numbness. Psych: No SI or HI  ____________________________________________   PHYSICAL EXAM:  VITAL SIGNS: ED Triage Vitals  Enc Vitals Group     BP 08/09/16 1710 (!) 178/94     Pulse Rate 08/09/16 1710 91     Resp 08/09/16 1710 18     Temp 08/09/16 1710 98.2 F (36.8 C)     Temp Source 08/09/16 1710 Oral     SpO2 08/09/16 1707 100 %     Weight 08/09/16 1709 164 lb (74.4 kg)     Height 08/09/16 1709 _0  (1.702 m)     Head Circumference --      Peak Flow --      Pain Score 08/09/16 1709 6     Pain Loc --      Pain Edu? --      Excl. in [place]? --     Constitutional: Alert and oriented. Well appearing and in no apparent distress. HEENT:      Head: Normocephalic and atraumatic.         Eyes: Conjunctivae are normal. Sclera is non-icteric. EOMI. PERRL      Mouth/Throat: Mucous membranes are moist.       Neck: Supple with no signs of meningismus. Cardiovascular: Regular rate and rhythm. No murmurs, gallops, or rubs. 2+ symmetrical distal pulses are present  in all extremities. No JVD. Respiratory: Normal respiratory effort. Lungs are clear to auscultation bilaterally. No wheezes, crackles, or rhonchi.  Gastrointestinal: Soft, non tender, and non distended with positive bowel sounds. No rebound or guarding. Musculoskeletal: Nontender with normal range of motion in all extremities. No edema, cyanosis, or erythema of extremities. Neurologic: Normal speech and language. Face is symmetric. Moving all extremities. No gross focal neurologic deficits are appreciated. Skin: Skin is warm, dry and intact. No rash noted. Psychiatric: Mood and affect are normal. Speech and behavior are normal.  ____________________________________________   LABS (all labs ordered are listed, but only abnormal results are displayed)  Labs Reviewed  BASIC METABOLIC PANEL - Abnormal; Notable for the following:       Result Value   Potassium 2.8 (*)    All other components within normal limits  CBC - Abnormal; Notable for the following:    Hemoglobin 10.4 (*)    HCT 32.1 (*)    RDW 16.4 (*)    All other components within normal limits  TROPONIN I - Abnormal; Notable for the following:    Troponin I 0.05 (*)    All other components within normal limits  TROPONIN I - Abnormal; Notable for the following:    Troponin I 0.06 (*)    All other components within normal limits  TROPONIN I - Abnormal; Notable for the following:    Troponin I 0.05 (*)    All other components within normal limits  CBC - Abnormal; Notable for the following:    RBC 3.79 (*)    Hemoglobin 10.1 (*)    HCT 30.9 (*)    RDW 16.7 (*)    All other components within normal limits  GLUCOSE, CAPILLARY - Abnormal; Notable for the following:    Glucose-Capillary 147 (*)    All other components within normal limits  MAGNESIUM - Abnormal; Notable for the following:    Magnesium 1.3 (*)    All other components within normal limits  BASIC METABOLIC PANEL - Abnormal; Notable for the following:    CO2 21  (*)    Glucose, Bld 175 (*)  Calcium 8.8 (*)    All other components within normal limits  TROPONIN I  CREATININE, SERUM  PHOSPHORUS  POTASSIUM  BASIC METABOLIC PANEL  MAGNESIUM   ____________________________________________  EKG  ED ECG REPORT I, Rudene Re, the attending physician, personally viewed and interpreted this ECG.  Normal sinus rhythm, rate of 93, normal intervals, normal axis, no ST elevations or depressions, flattening T waves diffusely. Unchanged from prior. ____________________________________________  RADIOLOGY  CXR: Negative ____________________________________________   PROCEDURES  Procedure(s) performed: None Procedures Critical Care performed:  None ____________________________________________   INITIAL IMPRESSION / ASSESSMENT AND PLAN / ED COURSE   71 y.o. female with a history of diabetes, hypertension, hyperlipidemia, anxiety, anemia who presents for evaluation of sudden onset chest pressure associated with N/V and dizziness. Pain resolved with sublingual nitro x 3 per EMS. Patient also received ASA per EMS. NO pain at this time. EKG with no ischemic changes. Patient placed on telemetry. Will monitor closely and cycle cardiac enzymes. Will discuss with patient's cardiologist Dr. Humphrey Rolls.  Clinical Course as of Aug 10 1699  Tue Aug 09, 2016  2033 Patient remains chest pain-free. Troponin 1 negative. Potassium was low at 2.8 which was supplemented. Discussed patient's visit and workup with Dr. Humphrey Rolls, her cardiologist who will see for close follow in 2 days in his clinic.Patient is comfortable with this plan. Recommended that she start potassium supplementation at home. Second troponin pending. Care transferred to Dr. Kerman Passey  [CV]    Clinical Course User Index [CV] Rudene Re, MD    Pertinent labs & imaging results that were available during my care of the patient were reviewed by me and considered in my medical decision making  (see chart for details).    ____________________________________________   FINAL CLINICAL IMPRESSION(S) / ED DIAGNOSES  Final diagnoses:  Chest pain, unspecified type  Hypokalemia      NEW MEDICATIONS STARTED DURING THIS VISIT:  Current Discharge Medication List    START taking these medications   Details  isosorbide mononitrate (IMDUR) 30 MG 24 hr tablet Take 1 tablet (30 mg total) by mouth daily. Qty: 30 tablet, Refills: 0    ondansetron (ZOFRAN) 4 MG tablet Take 1 tablet (4 mg total) by mouth every 8 (eight) hours as needed for nausea or vomiting. Qty: 20 tablet, Refills: 0    potassium chloride (KLOR-CON 10) 10 MEQ tablet Take 1 tablet (10 mEq total) by mouth 2 (two) times daily. Qty: 60 tablet, Refills: 0         Note:  This document was prepared using Dragon voice recognition software and may include unintentional dictation errors.    Rudene Re, MD 08/10/16 [phone]

## 2016-08-10 ENCOUNTER — Observation Stay
Admit: 2016-08-10 | Discharge: 2016-08-10 | Disposition: A | Discharge status: Home / Self Care | Payer: Medicare Other | Attending: Family Medicine | Admitting: Family Medicine

## 2016-08-10 DIAGNOSIS — E876 Hypokalemia: Secondary | ICD-10-CM | POA: Diagnosis not present

## 2016-08-10 LAB — CBC
HCT: 30.9 % — ABNORMAL LOW (ref 35.0–47.0)
Hemoglobin: 10.1 g/dL — ABNORMAL LOW (ref 12.0–16.0)
MCH: 26.7 pg (ref 26.0–34.0)
MCHC: 32.8 g/dL (ref 32.0–36.0)
MCV: 81.5 fL (ref 80.0–100.0)
PLATELETS: 262 10*3/uL (ref 150–440)
RBC: 3.79 MIL/uL — ABNORMAL LOW (ref 3.80–5.20)
RDW: 16.7 % — AB (ref 11.5–14.5)
WBC: 6.6 10*3/uL (ref 3.6–11.0)

## 2016-08-10 LAB — CREATININE, SERUM
Creatinine, Ser: 0.76 mg/dL (ref 0.44–1.00)
GFR calc Af Amer: >60 mL/min (ref >60)
GFR calc non Af Amer: >60 mL/min (ref >60)

## 2016-08-10 LAB — BASIC METABOLIC PANEL
ANION GAP: 10 (ref 5–15)
BUN: 9 mg/dL (ref 6–20)
CALCIUM: 8.8 mg/dL — AB (ref 8.9–10.3)
CO2: 21 mmol/L — ABNORMAL LOW (ref 22–32)
Chloride: 106 mmol/L (ref 101–111)
Creatinine, Ser: 0.62 mg/dL (ref 0.44–1.00)
GFR calc Af Amer: >60 mL/min (ref >60)
GLUCOSE: 175 mg/dL — AB (ref 65–99)
Potassium: 3.9 mmol/L (ref 3.5–5.1)
SODIUM: 137 mmol/L (ref 135–145)

## 2016-08-10 LAB — TROPONIN I
TROPONIN I: 0.05 ng/mL — AB (ref <0.03)
Troponin I: 0.06 ng/mL (ref <0.03)

## 2016-08-10 LAB — MAGNESIUM: Magnesium: 1.3 mg/dL — ABNORMAL LOW (ref 1.7–2.4)

## 2016-08-10 LAB — POTASSIUM: POTASSIUM: 4.1 mmol/L (ref 3.5–5.1)

## 2016-08-10 LAB — PHOSPHORUS: PHOSPHORUS: 3.7 mg/dL (ref 2.5–4.6)

## 2016-08-10 LAB — GLUCOSE, CAPILLARY: Glucose-Capillary: 147 mg/dL — ABNORMAL HIGH (ref 65–99)

## 2016-08-10 MED ORDER — NITROGLYCERIN 0.4 MG SL SUBL: 0.4000 mg | SUBLINGUAL_TABLET | SUBLINGUAL | Status: DC | PRN

## 2016-08-10 MED ORDER — HYDRALAZINE HCL 20 MG/ML IJ SOLN
5.0000 mg | Freq: Once | INTRAMUSCULAR | Status: AC
  Administered 2016-08-10: 5 mg via INTRAVENOUS
  Filled 2016-08-10: qty 1

## 2016-08-10 MED ORDER — ISOSORBIDE MONONITRATE ER 30 MG PO TB24
30.0000 mg | ORAL_TABLET | Freq: Every day | ORAL | Status: DC
  Administered 2016-08-10 – 2016-08-11 (×2): 30 mg via ORAL
  Filled 2016-08-10 (×2): qty 1

## 2016-08-10 MED ORDER — ZOLPIDEM TARTRATE 5 MG PO TABS
5.0000 mg | ORAL_TABLET | Freq: Every evening | ORAL | Status: DC | PRN
  Administered 2016-08-10: 5 mg via ORAL
  Filled 2016-08-10: qty 1

## 2016-08-10 MED ORDER — PROCHLORPERAZINE EDISYLATE 5 MG/ML IJ SOLN
10.0000 mg | Freq: Four times a day (QID) | INTRAMUSCULAR | Status: DC | PRN
  Administered 2016-08-10 (×2): 10 mg via INTRAVENOUS
  Filled 2016-08-10 (×3): qty 2

## 2016-08-10 MED ORDER — MAGNESIUM SULFATE 4 GM/100ML IV SOLN
4.0000 g | Freq: Once | INTRAVENOUS | Status: AC
Start: 2016-08-10 — End: 2016-08-10
  Administered 2016-08-10: 4 g via INTRAVENOUS
  Filled 2016-08-10: qty 100

## 2016-08-10 MED ORDER — ONDANSETRON HCL 4 MG PO TABS: 4.0000 mg | ORAL_TABLET | Freq: Three times a day (TID) | ORAL | 0 refills | Status: DC | PRN

## 2016-08-10 MED ORDER — LABETALOL HCL 5 MG/ML IV SOLN
20.0000 mg | Freq: Once | INTRAVENOUS | Status: AC
  Administered 2016-08-10: 20 mg via INTRAVENOUS
  Filled 2016-08-10: qty 4

## 2016-08-10 MED ORDER — MAGNESIUM OXIDE 400 (241.3 MG) MG PO TABS
200.0000 mg | ORAL_TABLET | Freq: Every day | ORAL | Status: DC
  Administered 2016-08-10 – 2016-08-11 (×2): 200 mg via ORAL
  Filled 2016-08-10 (×2): qty 1

## 2016-08-10 MED ORDER — ISOSORBIDE MONONITRATE ER 30 MG PO TB24: 30.0000 mg | ORAL_TABLET | Freq: Every day | ORAL | 0 refills | Status: DC

## 2016-08-10 NOTE — Progress Notes (Signed)
Medication Adjustment:    71 yo female ordered zolpidem 41m QHS prn as a sleep aid. Per protocol for all female patients, will decrease dose to zolpidem 55mQHS prn.   MLS  [phone]

## 2016-08-10 NOTE — Progress Notes (Signed)
[place] for electrolyte monitoring   Pharmacy consulted for electrolyte replacement for 71 yo female admitted with chest pain. Patient has received potassium 73mq PO x 1 and potassium 360m IV x 1. Patient ordered magnesium oxide 20054mO Daily.   Plan:   Will order magnesium 4g IV x 1. Will recheck potassium 1800. Will recheck all BMP/magenesium with am labs.    Allergies  Allergen Reactions  . Prozac [Fluoxetine Hcl] Other (See Comments)    "felt crazy"    Patient Measurements: Height: _0  (170.2 cm) Weight: 166 lb (75.3 kg) IBW/kg (Calculated) : 61.6   Vital Signs: Temp: 98.3 F (36.8 C) (03/07 0800) Temp Source: Oral (03/07 0800) BP: 158/82 (03/07 0800) Pulse Rate: 81 (03/07 0800) Intake/Output from previous day: No intake/output data recorded. Intake/Output from this shift: No intake/output data recorded.  Labs:  Recent Labs  08/09/16 1714 08/10/16 0038 08/10/16 0528  WBC 7.0 6.6  --   HGB 10.4* 10.1*  --   HCT 32.1* 30.9*  --   PLT 223 262  --   CREATININE 0.72 0.76  --   MG  --   --  1.3*  PHOS  --   --  3.7   Estimated Creatinine Clearance: 70.3 mL/min (by C-G formula based on SCr of 0.76 mg/dL).   Pharmacy will continue to monitor and adjust per consult.   Earland Reish L 08/10/2016,9:43 AM

## 2016-08-10 NOTE — Consult Note (Signed)
Jamie Murray is a 70 y.o. female  [id number]  Primary Cardiologist: Dr. Neoma Laming Reason for Consultation: Chest pressure and mildly elevated troponin  HPI: 71yo African American female presents for evaluation of chest pressure. She has a history of diabetes, anemia, GERD, HTN, hyperlipidemia, TIA insomnia, migraines. She presented to ER with chest pressure and nausea. Her chest pressure resolved after 3 sublingual nitroglycerine. She has an apptment with a nephrologist regarding refractory HTN.    Review of Systems: No chest pain, LE edema, or abdominal pain. Mild SOB with exertion which is chronic.     Past Medical History:  Diagnosis Date  . Allergy   . Anemia   . Anxiety   . Depression   . Diabetes ([place])   . GERD (gastroesophageal reflux disease)   . Hyperlipidemia   . Hypertension   . Insomnia   . Migraines   . Osteoporosis   . [facility] spotted fever   . Transient cerebral ischemia   . Vertigo    every 2-3 months    Facility-Administered Medications Prior to Admission  Medication Dose Route Frequency Provider Last Rate Last Dose  . cyanocobalamin ((VITAMIN B-12)) injection 1,000 mcg  1,000 mcg Intramuscular Q30 days Kathrine Haddock, NP   1,000 mcg at 06/07/16 7034   Medications Prior to Admission  Medication Sig Dispense Refill  . aspirin 81 MG tablet Take 81 mg by mouth daily.    Marland Kitchen atorvastatin (LIPITOR) 10 MG tablet Take 1 tablet (10 mg total) by mouth daily. 90 tablet 3  . Blood Glucose Monitoring Suppl (GNP EASY TOUCH GLUCOSE METER) DEVI See admin instructions.  0  . Cholecalciferol (VITAMIN D3) 2000 units TABS Take 2,000 Units by mouth daily.    . clopidogrel (PLAVIX) 75 MG tablet Take 1 tablet (75 mg total) by mouth daily. 90 tablet 3  . EASY TOUCH TEST test strip USE TO CHECK BLOOD SUGAR ONCE DAILY  0  . glipiZIDE (GLUCOTROL XL) 5 MG 24 hr tablet Take 2 tablets (10 mg total) by mouth daily with breakfast. 60 tablet 2  . hydrALAZINE  (APRESOLINE) 100 MG tablet Take 100 mg by mouth 3 (three) times daily.    Marland Kitchen LORazepam (ATIVAN) 0.5 MG tablet Take 1 tablet (0.5 mg total) by mouth daily as needed for anxiety. 30 tablet 1  . Magnesium Oxide 250 MG TABS Take 250 mg by mouth daily.    . metFORMIN (GLUCOPHAGE-XR) 500 MG 24 hr tablet Take 4 tablets (2,000 mg total) by mouth daily with breakfast. 360 tablet 3  . metoprolol succinate (TOPROL-XL) 100 MG 24 hr tablet Take 100 mg by mouth daily.     . Multiple Vitamin (MULTIVITAMIN) tablet Take 1 tablet by mouth daily.    . pantoprazole (PROTONIX) 40 MG tablet Take 1 tablet (40 mg total) by mouth 2 (two) times daily. 60 tablet 11  . sertraline (ZOLOFT) 50 MG tablet Take 1 tablet (50 mg total) by mouth daily. 30 tablet 3  . valsartan (DIOVAN) 320 MG tablet Take 320 mg by mouth daily.    Marland Kitchen zolpidem (AMBIEN) 10 MG tablet Take 1 tablet (10 mg total) by mouth at bedtime as needed for sleep. 15 tablet 1  . Dulaglutide (TRULICITY) 1.5 KB/5.2YE SOPN Inject 1.5 mg into the skin once a week. 12 pen 3  . Flaxseed, Linseed, (FLAX SEED OIL PO) Take 1,000 mg by mouth 2 (two) times daily.        Marland Kitchen aspirin  81 mg Oral  Daily  . atorvastatin  10 mg Oral Daily  . cholecalciferol  2,000 Units Oral Daily  . clopidogrel  75 mg Oral Daily  . heparin  5,000 Units Subcutaneous Q8H  . hydrALAZINE  100 mg Oral TID  . irbesartan  300 mg Oral Daily  . magnesium oxide  200 mg Oral Daily  . metoprolol succinate  100 mg Oral Daily  . multivitamin with minerals  1 tablet Oral Daily  . pantoprazole  40 mg Oral BID  . sertraline  50 mg Oral Daily    Infusions:   Allergies  Allergen Reactions  . Prozac [Fluoxetine Hcl] Other (See Comments)    "felt crazy"    Social History   Social History  . Marital status: Married    Spouse name: [name]  . Number of children: 3  . Years of education: 14   Occupational History  .      work part time, day care   Social History Main Topics  . Smoking status:  Never Smoker  . Smokeless tobacco: Never Used  . Alcohol use No  . Drug use: No  . Sexual activity: No   Other Topics Concern  . Not on file   Social History Narrative   Lives with husband   Caffeine use- coffee 2 cups daily, Coke once a week, green tea daily    Family History  Problem Relation Age of Onset  . Diabetes Mother   . Hypertension Mother   . Stroke Mother   . Heart disease Mother     MI  . Cancer Father     pancreatic  . Stroke Father   . Hypertension Sister   . Diabetes Brother   . Diabetes Maternal Grandfather   . Diabetes Sister   . Diabetes Sister   . Diabetes Brother   . Diabetes Brother     PHYSICAL EXAM: Vitals:   08/10/16 0600 08/10/16 0800  BP: (!) 186/103 (!) 158/82  Pulse: 96 81  Resp: 18 16  Temp: 98 F (36.7 C) 98.3 F (36.8 C)     Intake/Output Summary (Last 24 hours) at 08/10/16 0930 Last data filed at 08/10/16 0904  Gross per 24 hour  Intake                0 ml  Output                0 ml  Net                0 ml    General:  Well appearing. No respiratory difficulty HEENT: normal Neck: supple. no JVD. Carotids 2+ bilat; no bruits. No lymphadenopathy or thryomegaly appreciated. Cor: PMI nondisplaced. Regular rate & rhythm. No rubs, gallops or murmurs. Lungs: clear Abdomen: soft, nontender, nondistended. No hepatosplenomegaly. No bruits or masses. Good bowel sounds. Extremities: no cyanosis, clubbing, rash, edema Neuro: alert & oriented x 3, cranial nerves grossly intact. moves all 4 extremities w/o difficulty. Affect pleasant.  ECG: Normal sinus rhythm 81bpm  Results for orders placed or performed during the hospital encounter of 08/09/16 (from the past 24 hour(s))  Basic metabolic panel     Status: Abnormal   Collection Time: 08/09/16  5:14 PM  Result Value Ref Range   Sodium 141 135 - 145 mmol/L   Potassium 2.8 (L) 3.5 - 5.1 mmol/L   Chloride 108 101 - 111 mmol/L   CO2 22 22 - 32 mmol/L   Glucose, Bld 89 65 - 99  mg/dL   BUN 7 6 - 20 mg/dL   Creatinine, Ser 0.72 0.44 - 1.00 mg/dL   Calcium 8.9 8.9 - 10.3 mg/dL   GFR calc non Af Amer >60 >60 mL/min   GFR calc Af Amer >60 >60 mL/min   Anion gap 11 5 - 15  CBC     Status: Abnormal   Collection Time: 08/09/16  5:14 PM  Result Value Ref Range   WBC 7.0 3.6 - 11.0 K/uL   RBC 3.84 3.80 - 5.20 MIL/uL   Hemoglobin 10.4 (L) 12.0 - 16.0 g/dL   HCT 32.1 (L) 35.0 - 47.0 %   MCV 83.6 80.0 - 100.0 fL   MCH 27.1 26.0 - 34.0 pg   MCHC 32.4 32.0 - 36.0 g/dL   RDW 16.4 (H) 11.5 - 14.5 %   Platelets 223 150 - 440 K/uL  Troponin I     Status: None   Collection Time: 08/09/16  5:14 PM  Result Value Ref Range   Troponin I <0.03 <0.03 ng/mL  Troponin I     Status: Abnormal   Collection Time: 08/09/16  8:16 PM  Result Value Ref Range   Troponin I 0.05 (HH) <0.03 ng/mL  Troponin I-serum (0, 3, 6 hours)     Status: Abnormal   Collection Time: 08/10/16 12:38 AM  Result Value Ref Range   Troponin I 0.06 (HH) <0.03 ng/mL  CBC     Status: Abnormal   Collection Time: 08/10/16 12:38 AM  Result Value Ref Range   WBC 6.6 3.6 - 11.0 K/uL   RBC 3.79 (L) 3.80 - 5.20 MIL/uL   Hemoglobin 10.1 (L) 12.0 - 16.0 g/dL   HCT 30.9 (L) 35.0 - 47.0 %   MCV 81.5 80.0 - 100.0 fL   MCH 26.7 26.0 - 34.0 pg   MCHC 32.8 32.0 - 36.0 g/dL   RDW 16.7 (H) 11.5 - 14.5 %   Platelets 262 150 - 440 K/uL  Creatinine, serum     Status: None   Collection Time: 08/10/16 12:38 AM  Result Value Ref Range   Creatinine, Ser 0.76 0.44 - 1.00 mg/dL   GFR calc non Af Amer >60 >60 mL/min   GFR calc Af Amer >60 >60 mL/min  Troponin I-serum (0, 3, 6 hours)     Status: Abnormal   Collection Time: 08/10/16  5:28 AM  Result Value Ref Range   Troponin I 0.05 (HH) <0.03 ng/mL  Glucose, capillary     Status: Abnormal   Collection Time: 08/10/16  6:07 AM  Result Value Ref Range   Glucose-Capillary 147 (H) 65 - 99 mg/dL   Dg Chest 2 View  Result Date: 08/09/2016 CLINICAL DATA:  Central chest pain  starting today EXAM: CHEST  2 VIEW COMPARISON:  05/02/2016 FINDINGS: Borderline cardiomegaly. No infiltrate or pulmonary edema. Mild thoracic spine osteopenia. Minimal degenerative changes mid thoracic spine. IMPRESSION: No active cardiopulmonary disease. Electronically Signed   By: Lahoma Crocker M.D.   On: 08/09/2016 17:49     ASSESSMENT AND PLAN:   1. Chest pain: Mildly elevated troponin likely due to demand ischemia with no remaining chest pain. Blood pressure remains elevated with history of refractory HTN.    2. Hypokalemia noted on yesterday's labwork. Replacement was given last night, monitor potassium closely.   3. Start Plavix, Aspirin, and isosorbide and may discharge patient today. We will complete outpatient workup and follow up in our office.    Appointment tomorrow 08/11/2016 at 10am with [facility].    Jake Bathe

## 2016-08-10 NOTE — Progress Notes (Signed)
[facility] [place] NAME: Jamie Murray    MR#:  [id number]  DATE OF BIRTH:  10-27-1945  SUBJECTIVE:  CHIEF COMPLAINT:   Chief Complaint  Patient presents with  . Chest Pain    REVIEW OF SYSTEMS:    ROS  Nutrition: Tolerating Diet: Tolerating PT:      DRUG ALLERGIES:   Allergies  Allergen Reactions  . Prozac [Fluoxetine Hcl] Other (See Comments)    "felt crazy"    VITALS:  Blood pressure (!) 157/77, pulse 80, temperature 97.7 F (36.5 C), temperature source Oral, resp. rate 18, height _0  (1.702 m), weight 75.3 kg (166 lb), SpO2 99 %.  PHYSICAL EXAMINATION:   Physical Exam  GENERAL:  71 y.o.-year-old patient lying in the bed with no acute distress.  EYES: Pupils equal, round, reactive to light and accommodation. No scleral icterus. Extraocular muscles intact.  HEENT: Head atraumatic, normocephalic. Oropharynx and nasopharynx clear.  NECK:  Supple, no jugular venous distention. No thyroid enlargement, no tenderness.  LUNGS: Normal breath sounds bilaterally, no wheezing, rales,rhonchi or crepitation. No use of accessory muscles of respiration.  CARDIOVASCULAR: S1, S2 normal. No murmurs, rubs, or gallops.  ABDOMEN: Soft, nontender, nondistended. Bowel sounds present. No organomegaly or mass.  EXTREMITIES: No pedal edema, cyanosis, or clubbing.  NEUROLOGIC: Cranial nerves II through XII are intact. Muscle strength 5/5 in all extremities. Sensation intact. Gait not checked.  PSYCHIATRIC: The patient is alert and oriented x 3.  SKIN: No obvious rash, lesion, or ulcer.    LABORATORY PANEL:   CBC  Recent Labs Lab 08/10/16 0038  WBC 6.6  HGB 10.1*  HCT 30.9*  PLT 262   ------------------------------------------------------------------------------------------------------------------  Chemistries   Recent Labs Lab 08/10/16 0528 08/10/16 1433  NA  --  137  K  --  3.9  CL  --  106  CO2  --  21*   GLUCOSE  --  175*  BUN  --  9  CREATININE  --  0.62  CALCIUM  --  8.8*  MG 1.3*  --    ------------------------------------------------------------------------------------------------------------------  Cardiac Enzymes  Recent Labs Lab 08/10/16 0528  TROPONINI 0.05*   ------------------------------------------------------------------------------------------------------------------  RADIOLOGY:  Dg Chest 2 View  Result Date: 08/09/2016 CLINICAL DATA:  Central chest pain starting today EXAM: CHEST  2 VIEW COMPARISON:  05/02/2016 FINDINGS: Borderline cardiomegaly. No infiltrate or pulmonary edema. Mild thoracic spine osteopenia. Minimal degenerative changes mid thoracic spine. IMPRESSION: No active cardiopulmonary disease. Electronically Signed   By: Lahoma Crocker M.D.   On: 08/09/2016 17:49     ASSESSMENT AND PLAN:   Active Problems:   Chest pain, rule out acute myocardial infarction   1.chest pain;likely non cardiac.troponin negative.continue asa,plavix,bblocker,nitrates,statins,Dr.Shaukat khan tomorrow but pt not discharged  Due to urine  Retention requiring in and out cath 2.DMII;continue home meds 3.chronic anemia 4.anxiety/depression;  D/w daughter   All the records are reviewed and case discussed with Care Management/Social Workerr. Management plans discussed with the patient, family and they are in agreement.  CODE STATUS:full  TOTAL TIME TAKING CARE OF THIS PATIENT: 35 minutes.   POSSIBLE D/C IN 1-2DAYS, DEPENDING ON CLINICAL CONDITION.   Epifanio Lesches M.D on 08/10/2016 at 7:07 PM  Between 7am to 6pm - Pager - [phone]  After 6pm go to [email or web address] - password [email or web address]  Office  [phone]  CC: Primary care physician; Kathrine Haddock, NP

## 2016-08-10 NOTE — Progress Notes (Signed)
[facility] for electrolyte monitoring   Pharmacy consulted for electrolyte replacement for 71 yo female admitted with chest pain. Patient has received potassium 81mq PO x 1 and potassium 373m IV x 1. Patient ordered magnesium oxide 20062mO Daily.   Assessment/Plan:  K = 4.1 is WNL this evening after repletion. No supplementation needed, will recheck electrolytes with AM labs tomorrow.   Allergies  Allergen Reactions  . Prozac [Fluoxetine Hcl] Other (See Comments)    "felt crazy"    Patient Measurements: Height: _0  (170.2 cm) Weight: 166 lb (75.3 kg) IBW/kg (Calculated) : 61.6   Vital Signs: Temp: 97.7 F (36.5 C) (03/07 1133) Temp Source: Oral (03/07 1133) BP: 157/77 (03/07 1133) Pulse Rate: 80 (03/07 1133) Intake/Output from previous day: No intake/output data recorded. Intake/Output from this shift: No intake/output data recorded.  Labs:  Recent Labs  08/09/16 1714 08/10/16 0038 08/10/16 0528 08/10/16 1433  WBC 7.0 6.6  --   --   HGB 10.4* 10.1*  --   --   HCT 32.1* 30.9*  --   --   PLT 223 262  --   --   CREATININE 0.72 0.76  --  0.62  MG  --   --  1.3*  --   PHOS  --   --  3.7  --    Estimated Creatinine Clearance: 70.3 mL/min (by C-G formula based on SCr of 0.62 mg/dL).   Pharmacy will continue to monitor and adjust per consult.   MarLenis NoonharmD Clinical Pharmacist 08/10/2016,7:23 PM

## 2016-08-10 NOTE — Progress Notes (Signed)
Blood pressure elevated. Notified MD. Orders placed. Will continue to monitor and assess.

## 2016-08-10 NOTE — Care Management Obs Status (Signed)
[facility] NOTIFICATION   Patient Details  Name: Telitha Plath MRN: [id number] Date of Birth: [birth year]   Medicare Observation Status Notification Given:  Yes    Marshell Garfinkel, RN 08/10/2016, 4:33 PM

## 2016-08-10 NOTE — Progress Notes (Signed)
Pt arrived via stretcher from ED with family at bedside. Pt A&O. Telemetry monitor applied and called to CCMD. Oriented to room and call bell with bed alarm in place. Yellow socks placed.

## 2016-08-10 NOTE — Discharge Summary (Signed)
Jamie Murray, is a 71 y.o. female  DOB [birth year]  MRN [id number].  Admission date:  08/09/2016  Admitting Physician  Harvie Bridge, DO  Discharge Date:  08/10/2016   Primary MD  Kathrine Haddock, NP  Recommendations for primary care physician for things to follow:   Follow-up with Dr. Lenard Forth in the office at 9 AM   Admission Diagnosis  Hypokalemia [E87.6] Chest pain, unspecified type [R07.9]   Discharge Diagnosis  Hypokalemia [E87.6] Chest pain, unspecified type [R07.9]    Active Problems:   Chest pain, rule out acute myocardial infarction      Past Medical History:  Diagnosis Date  . Allergy   . Anemia   . Anxiety   . Depression   . Diabetes ([place])   . GERD (gastroesophageal reflux disease)   . Hyperlipidemia   . Hypertension   . Insomnia   . Migraines   . Osteoporosis   . [facility] spotted fever   . Transient cerebral ischemia   . Vertigo    every 2-3 months    Past Surgical History:  Procedure Laterality Date  . ABDOMINAL HYSTERECTOMY    . CORONARY ANGIOPLASTY    . ESOPHAGOGASTRODUODENOSCOPY (EGD) WITH PROPOFOL N/A 08/24/2015   Procedure: ESOPHAGOGASTRODUODENOSCOPY (EGD) WITH PROPOFOL with dialation;  Surgeon: Lucilla Lame, MD;  Location: [facility];  Service: Endoscopy;  Laterality: N/A;  Diabetic - oral meds  . TUBAL LIGATION         History of present illness and  Hospital Course:     Kindly see H&P for history of present illness and admission details, please review complete Labs, Consult reports and Test reports for all details in brief  HPI  from the history and physical done on the day of admission 71 year old female patient with multiple medical problems of depression hypertension, anxiety, depression, diabetes mellitus type 2, hyperlipidemia came in  because of chest pain, admitted for chest pain evaluation.   Hospital Course  Chest pain likely secondary to anxiety,; patient troponins slightly elevated at 0.05. Seen by cardiology,Dr.Shaukat recommended outpatient workup including cardiac CT tomorrow in the office. advised to continue aspirin, Plavix, Imdur, statins. Spoke with cardiologist.he is ok with discharging pt.  #2 severe hypokalemia: Patient potassium 2.8 yesterday evening, recheck the potassium before the discharge.  3.DMII ,continue Trulicity,metformin, and polite. #4 history of TIA: Continue aspirin, Plavix #5 history of for depression, anxiety: Patient is on Zoloft, Ativan Anemia of chronic disease: Patient advised to follow-up with primary doctor and refer her to gastroenterology.   #6 nausea patient needs workup for possible diabetic gastroparesis: Patient requested. Prescriptions for Zofran.  D/w daughter.   Discharge Condition: stable   Follow UP  Follow-up Information    Dionisio David, MD On 08/11/2016.   Specialty:  Cardiology Why:  Time: 9:00 a.m. Contact information: [place] [address] [phone]             Discharge Instructions  and  Discharge Medications     Allergies as of 08/10/2016      Reactions   Prozac [fluoxetine Hcl] Other (See Comments)   "felt crazy"      Medication List    TAKE these medications   aspirin 81 MG tablet Take 81 mg by mouth daily.   atorvastatin 10 MG tablet Commonly known as:  LIPITOR Take 1 tablet (10 mg total) by mouth daily.   clopidogrel 75 MG tablet Commonly known as:  PLAVIX Take 1 tablet (75 mg total)  by mouth daily.   Dulaglutide 1.5 MG/0.5ML Sopn Commonly known as:  TRULICITY Inject 1.5 mg into the skin once a week.   EASY TOUCH TEST test strip Generic drug:  glucose blood USE TO CHECK BLOOD SUGAR ONCE DAILY   FLAX SEED OIL PO Take 1,000 mg by mouth 2 (two) times daily.   glipiZIDE 5 MG 24 hr tablet Commonly  known as:  GLUCOTROL XL Take 2 tablets (10 mg total) by mouth daily with breakfast.   GNP EASY TOUCH GLUCOSE METER Devi See admin instructions.   hydrALAZINE 100 MG tablet Commonly known as:  APRESOLINE Take 100 mg by mouth 3 (three) times daily.   isosorbide mononitrate 30 MG 24 hr tablet Commonly known as:  IMDUR Take 1 tablet (30 mg total) by mouth daily. Start taking on:  08/11/2016   LORazepam 0.5 MG tablet Commonly known as:  ATIVAN Take 1 tablet (0.5 mg total) by mouth daily as needed for anxiety.   Magnesium Oxide 250 MG Tabs Take 250 mg by mouth daily.   metFORMIN 500 MG 24 hr tablet Commonly known as:  GLUCOPHAGE-XR Take 4 tablets (2,000 mg total) by mouth daily with breakfast.   metoprolol succinate 100 MG 24 hr tablet Commonly known as:  TOPROL-XL Take 100 mg by mouth daily.   multivitamin tablet Take 1 tablet by mouth daily.   ondansetron 4 MG tablet Commonly known as:  ZOFRAN Take 1 tablet (4 mg total) by mouth every 8 (eight) hours as needed for nausea or vomiting.   pantoprazole 40 MG tablet Commonly known as:  PROTONIX Take 1 tablet (40 mg total) by mouth 2 (two) times daily.   potassium chloride 10 MEQ tablet Commonly known as:  KLOR-CON 10 Take 1 tablet (10 mEq total) by mouth 2 (two) times daily.   sertraline 50 MG tablet Commonly known as:  ZOLOFT Take 1 tablet (50 mg total) by mouth daily.   valsartan 320 MG tablet Commonly known as:  DIOVAN Take 320 mg by mouth daily.   Vitamin D3 2000 units Tabs Take 2,000 Units by mouth daily.   zolpidem 10 MG tablet Commonly known as:  AMBIEN Take 1 tablet (10 mg total) by mouth at bedtime as needed for sleep.         Diet and Activity recommendation: See Discharge Instructions above   Consults obtained - cardio   Major procedures and Radiology Reports - PLEASE review detailed and final reports for all details, in brief -      Dg Chest 2 View  Result Date: 08/09/2016 CLINICAL DATA:   Central chest pain starting today EXAM: CHEST  2 VIEW COMPARISON:  05/02/2016 FINDINGS: Borderline cardiomegaly. No infiltrate or pulmonary edema. Mild thoracic spine osteopenia. Minimal degenerative changes mid thoracic spine. IMPRESSION: No active cardiopulmonary disease. Electronically Signed   By: Lahoma Crocker M.D.   On: 08/09/2016 17:49    Micro Results    No results found for this or any previous visit (from the past 240 hour(s)).     Today   Subjective:   Kimimila Tauzin today has no headache,no chest abdominal pain,no new weakness tingling or numbness, feels much better wants to go home today.   Objective:   Blood pressure (!) 157/77, pulse 80, temperature 97.7 F (36.5 C), temperature source Oral, resp. rate 18, height _0  (1.702 m), weight 75.3 kg (166 lb), SpO2 99 %.   Intake/Output Summary (Last 24 hours) at 08/10/16 1408 Last data filed at 08/10/16 1402  Gross per 24 hour  Intake                0 ml  Output                0 ml  Net                0 ml    Exam Awake Alert, Oriented x 3, No new F.N deficits, Normal affect [place].AT,PERRAL Supple Neck,No JVD, No cervical lymphadenopathy appriciated.  Symmetrical Chest wall movement, Good air movement bilaterally, CTAB RRR,No Gallops,Rubs or new Murmurs, No Parasternal Heave +ve B.Sounds, Abd Soft, Non tender, No organomegaly appriciated, No rebound -guarding or rigidity. No Cyanosis, Clubbing or edema, No new Rash or bruise  Data Review   CBC w Diff: Lab Results  Component Value Date   WBC 6.6 08/10/2016   HGB 10.1 (L) 08/10/2016   HGB 11.5 (L) 04/10/2013   HCT 30.9 (L) 08/10/2016   HCT 32.4 (L) 05/03/2016   PLT 262 08/10/2016   PLT 285 05/03/2016   LYMPHOPCT 16 05/02/2016   LYMPHOPCT 34.2 04/10/2013   MONOPCT 4 05/02/2016   MONOPCT 4.7 04/10/2013   EOSPCT 1 05/02/2016   EOSPCT 0.6 04/10/2013   BASOPCT 0 05/02/2016   BASOPCT 0.3 04/10/2013    CMP: Lab Results  Component Value Date   NA 141 08/09/2016    NA 138 06/07/2016   NA 144 04/17/2012   K 2.8 (L) 08/09/2016   K 3.8 04/17/2012   CL 108 08/09/2016   CL 107 04/17/2012   CO2 22 08/09/2016   CO2 29 04/17/2012   BUN 7 08/09/2016   BUN 14 06/07/2016   BUN 12 04/17/2012   CREATININE 0.76 08/10/2016   CREATININE 0.87 09/25/2014   PROT 6.6 06/07/2016   PROT 7.2 04/17/2012   ALBUMIN 4.1 06/07/2016   ALBUMIN 3.7 04/17/2012   BILITOT 0.3 06/07/2016   BILITOT 0.3 04/17/2012   ALKPHOS 61 06/07/2016   ALKPHOS 58 04/17/2012   AST 34 06/07/2016   AST 24 04/17/2012   ALT 49 (H) 06/07/2016   ALT 19 04/17/2012  .   Total Time in preparing paper work, data evaluation and todays exam - 19 minutes  Amelio Brosky M.D on 08/10/2016 at 2:08 PM    Note: This dictation was prepared with Dragon dictation along with smaller phrase technology. Any transcriptional errors that result from this process are unintentional.

## 2016-08-10 NOTE — Progress Notes (Signed)
Pt with elevated blood pressure. MD notified, orders placed. Will continue to monitor and assess.

## 2016-08-10 NOTE — Progress Notes (Signed)
*  PRELIMINARY RESULTS* Echocardiogram 2D Echocardiogram has been performed.  Sherrie Sport 08/10/2016, 4:04 PM

## 2016-08-11 DIAGNOSIS — E876 Hypokalemia: Secondary | ICD-10-CM | POA: Diagnosis not present

## 2016-08-11 LAB — BASIC METABOLIC PANEL
Anion gap: 13 (ref 5–15)
BUN: 9 mg/dL (ref 6–20)
CALCIUM: 9.3 mg/dL (ref 8.9–10.3)
CO2: 20 mmol/L — ABNORMAL LOW (ref 22–32)
CREATININE: 0.86 mg/dL (ref 0.44–1.00)
Chloride: 101 mmol/L (ref 101–111)
GFR calc Af Amer: >60 mL/min (ref >60)
GLUCOSE: 163 mg/dL — AB (ref 65–99)
Potassium: 3.9 mmol/L (ref 3.5–5.1)
Sodium: 134 mmol/L — ABNORMAL LOW (ref 135–145)

## 2016-08-11 LAB — ECHOCARDIOGRAM COMPLETE
Height: 67 in
WEIGHTICAEL: 2656 [oz_av]

## 2016-08-11 LAB — MAGNESIUM: Magnesium: 1.9 mg/dL (ref 1.7–2.4)

## 2016-08-11 NOTE — Discharge Summary (Signed)
Jamie Murray, is a 71 y.o. female  DOB 07-19-1945  MRN [id number].  Admission date:  08/09/2016  Admitting Physician  Harvie Bridge, DO  Discharge Date:  08/11/2016   Primary MD  Kathrine Haddock, NP  Recommendations for primary care physician for things to follow:   Follow-up with Dr. Lenard Forth in the office at 11 am   Admission Diagnosis  Hypokalemia [E87.6] Chest pain, unspecified type [R07.9]   Discharge Diagnosis  Hypokalemia [E87.6] Chest pain, unspecified type [R07.9]    Active Problems:   Chest pain, rule out acute myocardial infarction      Past Medical History:  Diagnosis Date  . Allergy   . Anemia   . Anxiety   . Depression   . Diabetes ([place])   . GERD (gastroesophageal reflux disease)   . Hyperlipidemia   . Hypertension   . Insomnia   . Migraines   . Osteoporosis   . [facility] spotted fever   . Transient cerebral ischemia   . Vertigo    every 2-3 months    Past Surgical History:  Procedure Laterality Date  . ABDOMINAL HYSTERECTOMY    . CORONARY ANGIOPLASTY    . ESOPHAGOGASTRODUODENOSCOPY (EGD) WITH PROPOFOL N/A 08/24/2015   Procedure: ESOPHAGOGASTRODUODENOSCOPY (EGD) WITH PROPOFOL with dialation;  Surgeon: Lucilla Lame, MD;  Location: [facility];  Service: Endoscopy;  Laterality: N/A;  Diabetic - oral meds  . TUBAL LIGATION         History of present illness and  Hospital Course:     Kindly see H&P for history of present illness and admission details, please review complete Labs, Consult reports and Test reports for all details in brief  HPI  from the history and physical done on the day of admission 71 year old female patient with multiple medical problems of depression hypertension, anxiety, depression, diabetes mellitus type 2, hyperlipidemia came in  because of chest pain, admitted for chest pain evaluation.   Hospital Course  Chest pain likely secondary to anxiety,; patient troponins slightly elevated at 0.05. Seen by cardiology,Dr.Shaukat recommended outpatient workup including cardiac CT tomorrow in the office. advised to continue aspirin, Plavix, Imdur, statins. Spoke with cardiologist.he is ok with discharging pt.  #2 severe hypokalemia: Patient potassium 2.8 yesterday evening, recheck the potassium before the discharge.  3.DMII ,continue Trulicity,metformin, and polite. #4 history of TIA: Continue aspirin, Plavix #5 history of for depression, anxiety: Patient is on Zoloft, Ativan Anemia of chronic disease: Patient advised to follow-up with primary doctor and refer her to gastroenterology.   #6 nausea patient needs workup for possible diabetic gastroparesis: Patient requested. Prescriptions for Zofran. Marland KitchenUurine retention issue is resolving, she is voiding appropriately.  D/w daughter.   Discharge Condition: stable   Follow UP  Follow-up Information    Dionisio David, MD On 08/11/2016.   Specialty:  Cardiology Why:  Time: 9:00 a.m. Contact information: [place] [address] [phone]             Discharge Instructions  and  Discharge Medications     Allergies as of 08/11/2016      Reactions   Prozac [fluoxetine Hcl] Other (See Comments)   "felt crazy"      Medication List    TAKE these medications   aspirin 81 MG tablet Take 81 mg by mouth daily.   atorvastatin 10 MG tablet Commonly known as:  LIPITOR Take 1 tablet (10 mg total) by mouth daily.   clopidogrel 75 MG tablet Commonly known  as:  PLAVIX Take 1 tablet (75 mg total) by mouth daily.   Dulaglutide 1.5 MG/0.5ML Sopn Commonly known as:  TRULICITY Inject 1.5 mg into the skin once a week.   EASY TOUCH TEST test strip Generic drug:  glucose blood USE TO CHECK BLOOD SUGAR ONCE DAILY   FLAX SEED OIL PO Take 1,000 mg by  mouth 2 (two) times daily.   glipiZIDE 5 MG 24 hr tablet Commonly known as:  GLUCOTROL XL Take 2 tablets (10 mg total) by mouth daily with breakfast.   GNP EASY TOUCH GLUCOSE METER Devi See admin instructions.   hydrALAZINE 100 MG tablet Commonly known as:  APRESOLINE Take 100 mg by mouth 3 (three) times daily.   isosorbide mononitrate 30 MG 24 hr tablet Commonly known as:  IMDUR Take 1 tablet (30 mg total) by mouth daily.   LORazepam 0.5 MG tablet Commonly known as:  ATIVAN Take 1 tablet (0.5 mg total) by mouth daily as needed for anxiety.   Magnesium Oxide 250 MG Tabs Take 250 mg by mouth daily.   metFORMIN 500 MG 24 hr tablet Commonly known as:  GLUCOPHAGE-XR Take 4 tablets (2,000 mg total) by mouth daily with breakfast.   metoprolol succinate 100 MG 24 hr tablet Commonly known as:  TOPROL-XL Take 100 mg by mouth daily.   multivitamin tablet Take 1 tablet by mouth daily.   ondansetron 4 MG tablet Commonly known as:  ZOFRAN Take 1 tablet (4 mg total) by mouth every 8 (eight) hours as needed for nausea or vomiting.   pantoprazole 40 MG tablet Commonly known as:  PROTONIX Take 1 tablet (40 mg total) by mouth 2 (two) times daily.   potassium chloride 10 MEQ tablet Commonly known as:  KLOR-CON 10 Take 1 tablet (10 mEq total) by mouth 2 (two) times daily.   sertraline 50 MG tablet Commonly known as:  ZOLOFT Take 1 tablet (50 mg total) by mouth daily.   valsartan 320 MG tablet Commonly known as:  DIOVAN Take 320 mg by mouth daily.   Vitamin D3 2000 units Tabs Take 2,000 Units by mouth daily.   zolpidem 10 MG tablet Commonly known as:  AMBIEN Take 1 tablet (10 mg total) by mouth at bedtime as needed for sleep.         Diet and Activity recommendation: See Discharge Instructions above   Consults obtained - cardio   Major procedures and Radiology Reports - PLEASE review detailed and final reports for all details, in brief -      Dg Chest 2  View  Result Date: 08/09/2016 CLINICAL DATA:  Central chest pain starting today EXAM: CHEST  2 VIEW COMPARISON:  05/02/2016 FINDINGS: Borderline cardiomegaly. No infiltrate or pulmonary edema. Mild thoracic spine osteopenia. Minimal degenerative changes mid thoracic spine. IMPRESSION: No active cardiopulmonary disease. Electronically Signed   By: Lahoma Crocker M.D.   On: 08/09/2016 17:49    Micro Results    No results found for this or any previous visit (from the past 240 hour(s)).     Today   Subjective:   Jamie Murray today has no headache,no chest abdominal pain,no new weakness tingling or numbness, feels much better wants to go home today.   Objective:   Blood pressure 129/79, pulse 78, temperature 98.3 F (36.8 C), temperature source Oral, resp. rate 15, height _0  (1.702 m), weight 75.3 kg (166 lb), SpO2 100 %.   Intake/Output Summary (Last 24 hours) at 08/11/16 1204 Last data filed at  08/11/16 1027  Gross per 24 hour  Intake              560 ml  Output             2125 ml  Net            -1565 ml    Exam Awake Alert, Oriented x 3, No new F.N deficits, Normal affect [place].AT,PERRAL Supple Neck,No JVD, No cervical lymphadenopathy appriciated.  Symmetrical Chest wall movement, Good air movement bilaterally, CTAB RRR,No Gallops,Rubs or new Murmurs, No Parasternal Heave +ve B.Sounds, Abd Soft, Non tender, No organomegaly appriciated, No rebound -guarding or rigidity. No Cyanosis, Clubbing or edema, No new Rash or bruise  Data Review   CBC w Diff:  Lab Results  Component Value Date   WBC 6.6 08/10/2016   HGB 10.1 (L) 08/10/2016   HGB 11.5 (L) 04/10/2013   HCT 30.9 (L) 08/10/2016   HCT 32.4 (L) 05/03/2016   PLT 262 08/10/2016   PLT 285 05/03/2016   LYMPHOPCT 16 05/02/2016   LYMPHOPCT 34.2 04/10/2013   MONOPCT 4 05/02/2016   MONOPCT 4.7 04/10/2013   EOSPCT 1 05/02/2016   EOSPCT 0.6 04/10/2013   BASOPCT 0 05/02/2016   BASOPCT 0.3 04/10/2013    CMP:  Lab  Results  Component Value Date   NA 134 (L) 08/11/2016   NA 138 06/07/2016   NA 144 04/17/2012   K 3.9 08/11/2016   K 3.8 04/17/2012   CL 101 08/11/2016   CL 107 04/17/2012   CO2 20 (L) 08/11/2016   CO2 29 04/17/2012   BUN 9 08/11/2016   BUN 14 06/07/2016   BUN 12 04/17/2012   CREATININE 0.86 08/11/2016   CREATININE 0.87 09/25/2014   PROT 6.6 06/07/2016   PROT 7.2 04/17/2012   ALBUMIN 4.1 06/07/2016   ALBUMIN 3.7 04/17/2012   BILITOT 0.3 06/07/2016   BILITOT 0.3 04/17/2012   ALKPHOS 61 06/07/2016   ALKPHOS 58 04/17/2012   AST 34 06/07/2016   AST 24 04/17/2012   ALT 49 (H) 06/07/2016   ALT 19 04/17/2012  .   Total Time in preparing paper work, data evaluation and todays exam - 15 minutes  Jamie Murray M.D on 08/11/2016 at 12:04 PM    Note: This dictation was prepared with Dragon dictation along with smaller phrase technology. Any transcriptional errors that result from this process are unintentional.

## 2016-08-11 NOTE — Discharge Planning (Signed)
Patient IV and tele DCd.  Discharge papers given, explained and educated.  Scripted sent into [facility] and patient informed of suggested FU appts and appts made.  RN assessment and VS revealed stability for DC to home.  Patient wheeled to front and family transporting home via car.

## 2016-08-11 NOTE — Progress Notes (Signed)
Urinary retention resolved, patient stable for discharge, she is eager to go home, voiding well. No retention noted. Discharge home today. Discharge instructions are reviewed, discharge summary reviewed. Patient will follow with Dr. Humphrey Rolls from cardiology tomorrow at 11 AM.

## 2016-08-11 NOTE — Progress Notes (Signed)
SUBJECTIVE: Patient reports feeling much better today. She denies chest pain or pressure. She reports she is drinking a lot more water and is voiding appropriately.   Vitals:   08/10/16 0800 08/10/16 1133 08/10/16 1930 08/11/16 0428  BP: (!) 158/82 (!) 157/77 (!) 141/69 130/81  Pulse: 81 80 71 85  Resp: _0 Temp: 98.3 F (36.8 C) 97.7 F (36.5 C) 98.4 F (36.9 C) 98.4 F (36.9 C)  TempSrc: Oral Oral Oral Oral  SpO2: 100% 99% 96% 100%  Weight:      Height:        Intake/Output Summary (Last 24 hours) at 08/11/16 0855 Last data filed at 08/11/16 0617  Gross per 24 hour  Intake              240 ml  Output             1525 ml  Net            -1285 ml    LABS: Basic Metabolic Panel:  Recent Labs  08/10/16 0528 08/10/16 1433 08/10/16 1823 08/11/16 0514  NA  --  137  --  134*  K  --  3.9 4.1 3.9  CL  --  106  --  101  CO2  --  21*  --  20*  GLUCOSE  --  175*  --  163*  BUN  --  9  --  9  CREATININE  --  0.62  --  0.86  CALCIUM  --  8.8*  --  9.3  MG 1.3*  --   --  1.9  PHOS 3.7  --   --   --    Liver Function Tests: No results for input(s): AST, ALT, ALKPHOS, BILITOT, PROT, ALBUMIN in the last 72 hours. No results for input(s): LIPASE, AMYLASE in the last 72 hours. CBC:  Recent Labs  08/09/16 1714 08/10/16 0038  WBC 7.0 6.6  HGB 10.4* 10.1*  HCT 32.1* 30.9*  MCV 83.6 81.5  PLT 223 262   Cardiac Enzymes:  Recent Labs  08/09/16 2016 08/10/16 0038 08/10/16 0528  TROPONINI 0.05* 0.06* 0.05*   BNP: Invalid input(s): POCBNP D-Dimer: No results for input(s): DDIMER in the last 72 hours. Hemoglobin A1C: No results for input(s): HGBA1C in the last 72 hours. Fasting Lipid Panel: No results for input(s): CHOL, HDL, LDLCALC, TRIG, CHOLHDL, LDLDIRECT in the last 72 hours. Thyroid Function Tests: No results for input(s): TSH, T4TOTAL, T3FREE, THYROIDAB in the last 72 hours.  Invalid input(s): FREET3 Anemia Panel: No results for input(s):  VITAMINB12, FOLATE, FERRITIN, TIBC, IRON, RETICCTPCT in the last 72 hours.   PHYSICAL EXAM General: Well developed, well nourished, in no acute distress HEENT:  Normocephalic and atramatic Neck:  No JVD.  Lungs: Clear bilaterally to auscultation and percussion. Heart: HRRR . Normal S1 and S2 without gallops or murmurs.  Abdomen: Bowel sounds are positive, abdomen soft and non-tender  Msk:  Back normal, normal gait. Normal strength and tone for age. Extremities: No clubbing, cyanosis or edema.   Neuro: Alert and oriented X 3. Psych:  Good affect, responds appropriately  TELEMETRY:Normal sinus rhythm.  ASSESSMENT AND PLAN:  1. History of chest pressure with mildly elevated troponin, likely due to demand ischemia. Patient is feeling much better today and denies chest pain.  2.Uurine retention issue is resolving, she is voiding appropriately.  3.Hypokalemia has been corrected with replacements given yesterday.  4.Blood pressure is well-controlled.  Outpatient cardiac follow-up  scheduled tomorrow 11 AM with Dr. Neoma Laming, [facility].  Active Problems:   Chest pain, rule out acute myocardial infarction    Jake Bathe, NP-C 08/11/2016 8:55 AM

## 2016-08-11 NOTE — Progress Notes (Signed)
[place] for electrolyte monitoring   Pharmacy consulted for electrolyte replacement for 70 yo female admitted with chest pain. Patient has received potassium 88mq PO x 1 and potassium 319m IV x 1. Patient ordered magnesium oxide 20062mO Daily.   Assessment/Plan:  K = 3.9 Mg 1.9- both WNL.  No supplementation needed. Will sign off consult.   Allergies  Allergen Reactions  . Prozac [Fluoxetine Hcl] Other (See Comments)    "felt crazy"    Patient Measurements: Height: 5' 7" (170.2 cm) Weight: 166 lb (75.3 kg) IBW/kg (Calculated) : 61.6   Vital Signs: Temp: 98.3 F (36.8 C) (03/08 0900) Temp Source: Oral (03/08 0900) BP: 129/79 (03/08 1024) Pulse Rate: 78 (03/08 1024) Intake/Output from previous day: 03/07 0701 - 03/08 0700 In: 240 [P.O.:240] Out: 1525 [Urine:1525] Intake/Output from this shift: Total I/O In: 320 [P.O.:320] Out: 600 [Urine:600]  Labs:  Recent Labs  08/09/16 1714 08/10/16 0038 08/10/16 0528 08/10/16 1433 08/11/16 0514  WBC 7.0 6.6  --   --   --   HGB 10.4* 10.1*  --   --   --   HCT 32.1* 30.9*  --   --   --   PLT 223 262  --   --   --   CREATININE 0.72 0.76  --  0.62 0.86  MG  --   --  1.3*  --  1.9  PHOS  --   --  3.7  --   --    Estimated Creatinine Clearance: 65.4 mL/min (by C-G formula based on SCr of 0.86 mg/dL).   Pharmacy will continue to monitor and adjust per consult.   ShePernell DupreharmD Clinical Pharmacist 08/11/2016,10:45 AM

## 2016-08-16 ENCOUNTER — Telehealth: Payer: Self-pay

## 2016-08-16 MED ORDER — SERTRALINE HCL 100 MG PO TABS: 100.0000 mg | ORAL_TABLET | Freq: Every day | ORAL | 3 refills | Status: DC

## 2016-08-16 MED ORDER — ZOLPIDEM TARTRATE 10 MG PO TABS: 10.0000 mg | ORAL_TABLET | Freq: Every evening | ORAL | 1 refills | Status: DC | PRN

## 2016-08-16 NOTE — Telephone Encounter (Signed)
Received call from patient stating she needs something for sleep. Has not been sleeping.  Was hospitalized for 2 nights due to high Bp.  It was also mentioned that possibly Malachy Mood could increase the patients sertraline for patients depression.  Thanks  [phone]

## 2016-08-16 NOTE — Telephone Encounter (Signed)
Received a fax from the call center while we were closed yesterday afternoon because of weather stating that she needs a refill on sleeping medication. Patient stated that she had not slept in 2 nights.

## 2016-08-17 NOTE — Telephone Encounter (Signed)
Pt called and stated her pharmacy said her medication needed approval.

## 2016-08-18 NOTE — Telephone Encounter (Signed)
Called pharmacy to see what the patient was needing. Pharmacy stated that patient's Lorrin Mais is needing PA because of patient's age. Pharmacy stated that the insurance was covering this until they stopped recently. Pharmacy stated that they sent the PA request yesterday but I explained that the person that usually prints our faxes is out sick. Pharmacy stated that they would refax the PA request now.

## 2016-08-19 NOTE — Telephone Encounter (Signed)
PA was entered yesterday. PA Pending.

## 2016-08-19 NOTE — Telephone Encounter (Signed)
Routing to provider. Malachy Mood, please see documentation below. Is there anything else we can send in for the patient?

## 2016-08-19 NOTE — Telephone Encounter (Signed)
Patient's Ambien was denied.  Even though patient has been on the medication since 2004 and tried and failed Trazadone and Amitriptyline.  High risk medication for patient's 65 an older. We should be receiving a fax saying what is covered an if the provider wants to do an appeal.

## 2016-08-19 NOTE — Telephone Encounter (Signed)
Nothing additional that I know.  But, if pt buys without insurance it may not be expensive.

## 2016-08-22 NOTE — Telephone Encounter (Signed)
Called and spoke to patient. I let her know that her Ambien was denied by insurance. Patient stated that she would talk with Jamie Murray about it Friday at her appointment and may try trazodone again.

## 2016-08-25 LAB — HM DIABETES FOOT EXAM: HM Diabetic Foot Exam: NORMAL

## 2016-08-26 ENCOUNTER — Ambulatory Visit (INDEPENDENT_AMBULATORY_CARE_PROVIDER_SITE_OTHER): Payer: Medicare Other | Admitting: Unknown Physician Specialty

## 2016-08-26 ENCOUNTER — Other Ambulatory Visit: Payer: Self-pay

## 2016-08-26 ENCOUNTER — Encounter: Payer: Self-pay | Admitting: Unknown Physician Specialty

## 2016-08-26 DIAGNOSIS — F32A Depression, unspecified: Secondary | ICD-10-CM

## 2016-08-26 DIAGNOSIS — E538 Deficiency of other specified B group vitamins: Secondary | ICD-10-CM | POA: Diagnosis not present

## 2016-08-26 DIAGNOSIS — F5101 Primary insomnia: Secondary | ICD-10-CM

## 2016-08-26 DIAGNOSIS — I10 Essential (primary) hypertension: Secondary | ICD-10-CM

## 2016-08-26 DIAGNOSIS — N182 Chronic kidney disease, stage 2 (mild): Secondary | ICD-10-CM | POA: Diagnosis not present

## 2016-08-26 DIAGNOSIS — F418 Other specified anxiety disorders: Secondary | ICD-10-CM | POA: Diagnosis not present

## 2016-08-26 DIAGNOSIS — F329 Major depressive disorder, single episode, unspecified: Secondary | ICD-10-CM

## 2016-08-26 DIAGNOSIS — E1122 Type 2 diabetes mellitus with diabetic chronic kidney disease: Secondary | ICD-10-CM | POA: Diagnosis not present

## 2016-08-26 DIAGNOSIS — E1165 Type 2 diabetes mellitus with hyperglycemia: Principal | ICD-10-CM

## 2016-08-26 DIAGNOSIS — Z7409 Other reduced mobility: Secondary | ICD-10-CM | POA: Insufficient documentation

## 2016-08-26 DIAGNOSIS — F419 Anxiety disorder, unspecified: Secondary | ICD-10-CM

## 2016-08-26 DIAGNOSIS — IMO0001 Reserved for inherently not codable concepts without codable children: Secondary | ICD-10-CM

## 2016-08-26 MED ORDER — GLIPIZIDE ER 5 MG PO TB24: 10.0000 mg | ORAL_TABLET | Freq: Every day | ORAL | 2 refills | Status: DC

## 2016-08-26 NOTE — Assessment & Plan Note (Signed)
Increase Sertraline to 100 mg

## 2016-08-26 NOTE — Progress Notes (Signed)
BP (!) 150/88 (BP Location: Left Arm, Cuff Size: Normal)   Pulse 66   Temp 98.3 F (36.8 C)   Wt 168 lb 9.6 oz (76.5 kg)   LMP  (LMP Unknown)   BMI 26.41 kg/m    Subjective:    Patient ID: Jamie Murray, female    DOB: 12-03-1945, 71 y.o.   MRN: [id number]  HPI: Jamie Murray is a 71 y.o. female  Chief Complaint  Patient presents with  . Diabetes  . Hypertension  . Insomnia    pt's insurance will no longer cover ambien    Diabetes: Using medications without difficulties No hypoglycemic episodes No hyperglycemic episodes Feet problems: Blood Sugars averaging: eye exam within last year Last Hgb A1C: 7.2 when "insurance provider came out.  This is down from my previous of 8.5  Hypertension  Using medications without difficulty Average home BPs Not checking but also high with insurance   Using medication without problems or lightheadedness No chest pain with exertion or shortness of breath No Edema Also saw Dr. Humphrey Rolls and told she has pulmonary hypertension and plans to f/u  Elevated Cholesterol Using medications without problems No Muscle aches   Insomnia Insurance won't pay for Ambien and wants an alternative.  She goes to bed between 10:30-11. Wakes up at 2A but sleeps until 4.    Dizzyness Intermittent problem long-standing.  The las was 2 weeks ago and found to be magnesium deficient.     Depression Taking Sertraline 100 mg daily.  States Ativan not working.    States she is unsteady when she walks and requires a cane.    Relevant past medical, surgical, family and social history reviewed and updated as indicated. Interim medical history since our last visit reviewed. Allergies and medications reviewed and updated.  Review of Systems  Per HPI unless specifically indicated above     Objective:    BP (!) 150/88 (BP Location: Left Arm, Cuff Size: Normal)   Pulse 66   Temp 98.3 F (36.8 C)   Wt 168 lb 9.6 oz (76.5 kg)   LMP  (LMP  Unknown)   BMI 26.41 kg/m   Wt Readings from Last 3 Encounters:  08/26/16 168 lb 9.6 oz (76.5 kg)  08/10/16 166 lb (75.3 kg)  07/11/16 180 lb (81.6 kg)    Physical Exam  Constitutional: She is oriented to person, place, and time. She appears well-developed and well-nourished. No distress.  HENT:  Head: Normocephalic and atraumatic.  Eyes: Conjunctivae and lids are normal. Right eye exhibits no discharge. Left eye exhibits no discharge. No scleral icterus.  Neck: Normal range of motion. Neck supple. No JVD present. Carotid bruit is not present.  Cardiovascular: Normal rate, regular rhythm and normal heart sounds.   Pulmonary/Chest: Effort normal and breath sounds normal.  Abdominal: Normal appearance. There is no splenomegaly or hepatomegaly.  Musculoskeletal: Normal range of motion.  Neurological: She is alert and oriented to person, place, and time.  Skin: Skin is warm, dry and intact. No rash noted. No pallor.  Psychiatric: She has a normal mood and affect. Her behavior is normal. Judgment and thought content normal.    Results for orders placed or performed during the hospital encounter of 93/26/71  Basic metabolic panel  Result Value Ref Range   Sodium 141 135 - 145 mmol/L   Potassium 2.8 (L) 3.5 - 5.1 mmol/L   Chloride 108 101 - 111 mmol/L   CO2 22 22 - 32 mmol/L  Glucose, Bld 89 65 - 99 mg/dL   BUN 7 6 - 20 mg/dL   Creatinine, Ser 0.72 0.44 - 1.00 mg/dL   Calcium 8.9 8.9 - 10.3 mg/dL   GFR calc non Af Amer >60 >60 mL/min   GFR calc Af Amer >60 >60 mL/min   Anion gap 11 5 - 15  CBC  Result Value Ref Range   WBC 7.0 3.6 - 11.0 K/uL   RBC 3.84 3.80 - 5.20 MIL/uL   Hemoglobin 10.4 (L) 12.0 - 16.0 g/dL   HCT 32.1 (L) 35.0 - 47.0 %   MCV 83.6 80.0 - 100.0 fL   MCH 27.1 26.0 - 34.0 pg   MCHC 32.4 32.0 - 36.0 g/dL   RDW 16.4 (H) 11.5 - 14.5 %   Platelets 223 150 - 440 K/uL  Troponin I  Result Value Ref Range   Troponin I <0.03 <0.03 ng/mL  Troponin I  Result Value  Ref Range   Troponin I 0.05 (HH) <0.03 ng/mL  Troponin I-serum (0, 3, 6 hours)  Result Value Ref Range   Troponin I 0.06 (HH) <0.03 ng/mL  Troponin I-serum (0, 3, 6 hours)  Result Value Ref Range   Troponin I 0.05 (HH) <0.03 ng/mL  CBC  Result Value Ref Range   WBC 6.6 3.6 - 11.0 K/uL   RBC 3.79 (L) 3.80 - 5.20 MIL/uL   Hemoglobin 10.1 (L) 12.0 - 16.0 g/dL   HCT 30.9 (L) 35.0 - 47.0 %   MCV 81.5 80.0 - 100.0 fL   MCH 26.7 26.0 - 34.0 pg   MCHC 32.8 32.0 - 36.0 g/dL   RDW 16.7 (H) 11.5 - 14.5 %   Platelets 262 150 - 440 K/uL  Creatinine, serum  Result Value Ref Range   Creatinine, Ser 0.76 0.44 - 1.00 mg/dL   GFR calc non Af Amer >60 >60 mL/min   GFR calc Af Amer >60 >60 mL/min  Glucose, capillary  Result Value Ref Range   Glucose-Capillary 147 (H) 65 - 99 mg/dL  Magnesium  Result Value Ref Range   Magnesium 1.3 (L) 1.7 - 2.4 mg/dL  Phosphorus  Result Value Ref Range   Phosphorus 3.7 2.5 - 4.6 mg/dL  Potassium  Result Value Ref Range   Potassium 4.1 3.5 - 5.1 mmol/L  Basic metabolic panel  Result Value Ref Range   Sodium 137 135 - 145 mmol/L   Potassium 3.9 3.5 - 5.1 mmol/L   Chloride 106 101 - 111 mmol/L   CO2 21 (L) 22 - 32 mmol/L   Glucose, Bld 175 (H) 65 - 99 mg/dL   BUN 9 6 - 20 mg/dL   Creatinine, Ser 0.62 0.44 - 1.00 mg/dL   Calcium 8.8 (L) 8.9 - 10.3 mg/dL   GFR calc non Af Amer >60 >60 mL/min   GFR calc Af Amer >60 >60 mL/min   Anion gap 10 5 - 15  Basic metabolic panel  Result Value Ref Range   Sodium 134 (L) 135 - 145 mmol/L   Potassium 3.9 3.5 - 5.1 mmol/L   Chloride 101 101 - 111 mmol/L   CO2 20 (L) 22 - 32 mmol/L   Glucose, Bld 163 (H) 65 - 99 mg/dL   BUN 9 6 - 20 mg/dL   Creatinine, Ser 0.86 0.44 - 1.00 mg/dL   Calcium 9.3 8.9 - 10.3 mg/dL   GFR calc non Af Amer >60 >60 mL/min   GFR calc Af Amer >60 >60 mL/min   Anion  gap 13 5 - 15  Magnesium  Result Value Ref Range   Magnesium 1.9 1.7 - 2.4 mg/dL  Echocardiogram  Result Value Ref Range     Weight 2,656 oz   Height 67 in   BP 157/77 mmHg      Assessment & Plan:   Problem List Items Addressed This Visit      Unprioritized   Anxiety and depression    Increase Sertraline to 100 mg      DM type 2 (diabetes mellitus, type 2) (HCC)    Hgb A1C 7.2 and nurse visit from insurance just yesterday.  This down from 8.5.  Will continue to work on exercise.  Pt aware of goal      Essential (primary) hypertension    High today.  Maximum meds.  Seeing Dr. Humphrey Rolls for pulmonary hypertension.  Will also go to nephrology for further evaluation.  For right now, no change in medications      Hypomagnesemia    F/u pending with Nephrology.  Will start Magnesium supplements      Insomnia    Discuss with counselor.  Consider Belsomra.  Discussed sleep hygeine such as removing PT      Mobility impaired    Will refer to PT.  Going to cardiology for the SOB.            Follow up plan: Return in about 6 months (around 02/26/2017).

## 2016-08-26 NOTE — Patient Instructions (Signed)
Take melatonin .5 to 1 mg an hour before bedtime with a Magnesium supplement.  I prefer Magnesium Citrate (I use something called Calm and Relax) which is better absorbed then the more common Magnesium Oxide.

## 2016-08-26 NOTE — Assessment & Plan Note (Signed)
F/u pending with Nephrology.  Will start Magnesium supplements

## 2016-08-26 NOTE — Assessment & Plan Note (Signed)
High today.  Maximum meds.  Seeing Dr. Humphrey Rolls for pulmonary hypertension.  Will also go to nephrology for further evaluation.  For right now, no change in medications

## 2016-08-26 NOTE — Assessment & Plan Note (Signed)
Will refer to PT.  Going to cardiology for the SOB.

## 2016-08-26 NOTE — Assessment & Plan Note (Signed)
Hgb A1C 7.2 and nurse visit from insurance just yesterday.  This down from 8.5.  Will continue to work on exercise.  Pt aware of goal

## 2016-08-26 NOTE — Assessment & Plan Note (Addendum)
Discuss with counselor.  Consider Belsomra.  Discussed sleep hygeine such as removing PT

## 2016-09-07 ENCOUNTER — Other Ambulatory Visit: Payer: Self-pay

## 2016-09-20 ENCOUNTER — Other Ambulatory Visit: Payer: Self-pay

## 2016-09-20 MED ORDER — POTASSIUM CHLORIDE ER 10 MEQ PO TBCR: 10.0000 meq | EXTENDED_RELEASE_TABLET | Freq: Two times a day (BID) | ORAL | 0 refills | Status: DC

## 2016-09-26 ENCOUNTER — Other Ambulatory Visit: Payer: Self-pay

## 2016-09-26 MED ORDER — PANTOPRAZOLE SODIUM 40 MG PO TBEC: 40.0000 mg | DELAYED_RELEASE_TABLET | Freq: Two times a day (BID) | ORAL | 11 refills | Status: DC

## 2016-09-29 DIAGNOSIS — G4733 Obstructive sleep apnea (adult) (pediatric): Secondary | ICD-10-CM | POA: Insufficient documentation

## 2016-09-30 ENCOUNTER — Other Ambulatory Visit: Payer: Self-pay

## 2016-09-30 MED ORDER — LORAZEPAM 0.5 MG PO TABS: 0.5000 mg | ORAL_TABLET | Freq: Every day | ORAL | 1 refills | Status: DC | PRN

## 2016-09-30 MED ORDER — ZOLPIDEM TARTRATE 10 MG PO TABS: 10.0000 mg | ORAL_TABLET | Freq: Every evening | ORAL | 1 refills | Status: DC | PRN

## 2016-10-03 ENCOUNTER — Other Ambulatory Visit: Payer: Self-pay

## 2016-10-04 MED ORDER — CLOPIDOGREL BISULFATE 75 MG PO TABS: 75.0000 mg | ORAL_TABLET | Freq: Every day | ORAL | 3 refills | Status: DC

## 2016-10-24 ENCOUNTER — Ambulatory Visit: Payer: Medicare Other | Admitting: Internal Medicine

## 2016-10-25 ENCOUNTER — Other Ambulatory Visit
Admission: RE | Admit: 2016-10-25 | Discharge: 2016-10-25 | Disposition: A | Discharge status: Home / Self Care | Payer: Medicare Other | Source: Ambulatory Visit | Attending: Internal Medicine | Admitting: Internal Medicine

## 2016-10-25 ENCOUNTER — Ambulatory Visit
Admission: RE | Admit: 2016-10-25 | Discharge: 2016-10-25 | Disposition: A | Discharge status: Home / Self Care | Payer: Medicare Other | Source: Ambulatory Visit | Attending: Internal Medicine | Admitting: Internal Medicine

## 2016-10-25 ENCOUNTER — Encounter: Payer: Self-pay | Admitting: Internal Medicine

## 2016-10-25 ENCOUNTER — Ambulatory Visit (INDEPENDENT_AMBULATORY_CARE_PROVIDER_SITE_OTHER): Payer: Medicare Other | Admitting: Internal Medicine

## 2016-10-25 VITALS — BP 138/80 | HR 76 | Resp 16 | Ht 66.0 in | Wt 166.0 lb

## 2016-10-25 DIAGNOSIS — R5383 Other fatigue: Secondary | ICD-10-CM

## 2016-10-25 DIAGNOSIS — R0602 Shortness of breath: Secondary | ICD-10-CM

## 2016-10-25 DIAGNOSIS — I272 Pulmonary hypertension, unspecified: Secondary | ICD-10-CM

## 2016-10-25 LAB — HEPATIC FUNCTION PANEL
ALT: 52 U/L (ref 14–54)
AST: 42 U/L — ABNORMAL HIGH (ref 15–41)
Albumin: 3.8 g/dL (ref 3.5–5.0)
Alkaline Phosphatase: 76 U/L (ref 38–126)
Bilirubin, Direct: 0.1 mg/dL (ref 0.1–0.5)
Indirect Bilirubin: 0.5 mg/dL (ref 0.3–0.9)
Total Bilirubin: 0.6 mg/dL (ref 0.3–1.2)
Total Protein: 7.2 g/dL (ref 6.5–8.1)

## 2016-10-25 LAB — POCT I-STAT CREATININE: Creatinine, Ser: 1.1 mg/dL — ABNORMAL HIGH (ref 0.44–1.00)

## 2016-10-25 MED ORDER — IOPAMIDOL (ISOVUE-370) INJECTION 76%
75.0000 mL | Freq: Once | INTRAVENOUS | Status: AC | PRN
  Administered 2016-10-25: 75 mL via INTRAVENOUS

## 2016-10-25 NOTE — Progress Notes (Signed)
Name: Jamie Murray MRN: [id number] DOB: [birth year]    CONSULTATION DATE: 10/25/2016   REFERRING MD : Julian Hy NP  CHIEF COMPLAINT:  Assessment for Pulmonary HTN  HISTORY OF PRESENT ILLNESS:  71 yo pleasant female was recently assessed in ER for chest pain Patient did not have MI but was told to see cardiologist as outpatient-patient underwent cardiac work up and lab testing Patient obtained ECHO and was noted in report to have severe pulmonary HTN  Patient has been having SOB and DOE for several months with LE swelling She also feels very tired all the time She sees DR Humphrey Rolls Cardiology for 10 years She was recently DX with OSA on CPAP therapy 10 cm h20 AHI is 0.9 with current therapy  She has been having lower ext swelling for several months She has extensive second hand smoking history for 26 years    PAST MEDICAL HISTORY :   has a past medical history of Allergy; Anemia; Anxiety; Depression; Diabetes ([place]); GERD (gastroesophageal reflux disease); Hyperlipidemia; Hypertension; Insomnia; Migraines; Osteoporosis; [facility] spotted fever; Transient cerebral ischemia; and Vertigo.  has a past surgical history that includes Abdominal hysterectomy; Tubal ligation; Coronary angioplasty; and Esophagogastroduodenoscopy (egd) with propofol (N/A, 08/24/2015). Prior to Admission medications   Medication Sig Start Date End Date Taking? Authorizing Provider  aspirin 81 MG tablet Take 81 mg by mouth daily.    [provider]  atorvastatin (LIPITOR) 10 MG tablet Take 1 tablet (10 mg total) by mouth daily. 06/08/16   Kathrine Haddock, NP  Blood Glucose Monitoring Suppl ([place]) [place] See admin instructions. 02/23/16   [provider]  Cholecalciferol (VITAMIN D3) 2000 units TABS Take 2,000 Units by mouth daily.    [provider]  clopidogrel (PLAVIX) 75 MG tablet Take 1 tablet (75 mg total) by mouth daily. 10/04/16   Kathrine Haddock, NP    Dulaglutide (TRULICITY) 1.5 GY/1.8HU SOPN Inject 1.5 mg into the skin once a week. 06/08/16   Kathrine Haddock, NP  EASY TOUCH TEST test strip USE TO CHECK BLOOD SUGAR ONCE DAILY 02/23/16   [provider]  glipiZIDE (GLUCOTROL XL) 5 MG 24 hr tablet Take 2 tablets (10 mg total) by mouth daily with breakfast. 08/26/16   Kathrine Haddock, NP  hydrALAZINE (APRESOLINE) 100 MG tablet Take 100 mg by mouth 3 (three) times daily. 04/29/16   [provider]  isosorbide mononitrate (IMDUR) 30 MG 24 hr tablet Take 1 tablet (30 mg total) by mouth daily. 08/11/16   Epifanio Lesches, MD  LORazepam (ATIVAN) 0.5 MG tablet Take 1 tablet (0.5 mg total) by mouth daily as needed. 09/30/16   Kathrine Haddock, NP  metFORMIN (GLUCOPHAGE-XR) 500 MG 24 hr tablet Take 4 tablets (2,000 mg total) by mouth daily with breakfast. 05/03/16   Kathrine Haddock, NP  metoprolol succinate (TOPROL-XL) 100 MG 24 hr tablet Take 100 mg by mouth daily.  04/29/16   [provider]  Multiple Vitamin (MULTIVITAMIN) tablet Take 1 tablet by mouth daily.    [provider]  ondansetron (ZOFRAN) 4 MG tablet Take 1 tablet (4 mg total) by mouth every 8 (eight) hours as needed for nausea or vomiting. 08/10/16   Epifanio Lesches, MD  pantoprazole (PROTONIX) 40 MG tablet Take 1 tablet (40 mg total) by mouth 2 (two) times daily. 09/26/16   Kathrine Haddock, NP  potassium chloride (KLOR-CON 10) 10 MEQ tablet Take 1 tablet (10 mEq total) by mouth 2 (two) times daily. 09/20/16  Kathrine Haddock, NP  sertraline (ZOLOFT) 100 MG tablet Take 1 tablet (100 mg total) by mouth daily. Patient not taking: Reported on 08/26/2016 08/16/16   Kathrine Haddock, NP  sertraline (ZOLOFT) 50 MG tablet Take 50 mg by mouth daily. 07/26/16   [provider]  valsartan (DIOVAN) 320 MG tablet Take 320 mg by mouth daily. 04/26/16   [provider]  zolpidem (AMBIEN) 10 MG tablet Take 1 tablet (10 mg total) by mouth at bedtime as needed. for  sleep 09/30/16   Kathrine Haddock, NP   Allergies  Allergen Reactions  . Prozac [Fluoxetine Hcl] Other (See Comments)    "felt crazy"    FAMILY HISTORY:  family history includes Cancer in her father; Diabetes in her brother, brother, brother, maternal grandfather, mother, sister, and sister; Heart disease in her mother; Hypertension in her mother and sister; Stroke in her father and mother. SOCIAL HISTORY:  reports that she has never smoked. She has never used smokeless tobacco. She reports that she does not drink alcohol or use drugs.  REVIEW OF SYSTEMS:   Constitutional: Negative for fever, chills, weight loss, malaise/fatigue and diaphoresis.  HENT: Negative for hearing loss, ear pain, nosebleeds, congestion, sore throat, neck pain, tinnitus and ear discharge.   Eyes: Negative for blurred vision, double vision, photophobia, pain, discharge and redness.  Respiratory: -hemoptysis, -sputum production, +shortness of breath, +wheezing and- stridor.   Cardiovascular: Negative for chest pain, palpitations, orthopnea, claudication, +leg swelling and +PND.  Gastrointestinal: Negative for heartburn, nausea, vomiting, abdominal pain, diarrhea, constipation, blood in stool and melena.  Genitourinary: Negative for dysuria, urgency, frequency, hematuria and flank pain.  Musculoskeletal: Negative for myalgias, back pain, joint pain and falls.  Skin: Negative for itching and rash.  Neurological: Negative for dizziness, tingling, tremors, sensory change, speech change, focal weakness, seizures, loss of consciousness, weakness and headaches.  Endo/Heme/Allergies: Negative for environmental allergies and polydipsia. Does not bruise/bleed easily.  ALL OTHER ROS ARE NEGATIVE   BP 138/80 (BP Location: Right Arm, Cuff Size: Normal)   Pulse 76   Resp 16   Ht _0  (1.676 m)   Wt 166 lb (75.3 kg)   LMP  (LMP Unknown)   SpO2 99%   BMI 26.79 kg/m    Physical Examination:   GENERAL:NAD, no fevers,  chills, + fatigue+weakness HEAD: Normocephalic, atraumatic.  EYES: Pupils equal, round, reactive to light. Extraocular muscles intact. No scleral icterus.  MOUTH: Moist mucosal membrane.   EAR, NOSE, THROAT: Clear without exudates. No external lesions.  NECK: Supple. No thyromegaly. No nodules. No JVD.  PULMONARY:CTA B/L no wheezes, no crackles, no rhonchi CARDIOVASCULAR: S1 and S2. Regular rate and rhythm. No murmurs, rubs, or gallops. No edema.  GASTROINTESTINAL: Soft, nontender, nondistended. No masses. Positive bowel sounds.  MUSCULOSKELETAL: +swelling and +edema. Range of motion full in all extremities.  NEUROLOGIC: Cranial nerves II through XII are intact. No gross focal neurological deficits.  SKIN: No ulceration, lesions, rashes, or cyanosis. Skin warm and dry. Turgor intact.  PSYCHIATRIC: Mood, affect within normal limits. The patient is awake, alert and oriented x 3. Insight, judgment intact.     I have Independently reviewed images of CXR on 08/09/2016    on 10/25/2016 Interpretation:No infiltrate or pulmonary edema   ASSESSMENT / PLAN:  71 yo AAF with ongoing SOB and DOE with lower ext swelling and edema with signs of cor pulmonale with elevated pulm artery pressures on ECHO with dx of underlying OSA with extensive second hand smoke exposure  1.SOB/DOE-multifactorial? COPD?diastolic dysfunction? RT heart Strain? Will need to check 6MWT to assess for hypoxia  2.OSA on CPAP Will need to check for assessment  Of oxygen  -continue CPAP therapy of 10 cm h20 with AHI 0.9  3.Extensive smoking exposure -will need to check PFT's  4.elevated Pulm art pressures, cor pulmonale Will need to assess after all tests completed and patient will need Left?Right heart cath for definitive dx -will discuss after above tests completed  Patient/Family are satisfied with Plan of action and management. All questions answered  Corrin Parker, M.D.  [facility]   Medical Director [place] Director [facility] Cardio-Pulmonary Department

## 2016-10-25 NOTE — Patient Instructions (Signed)
CT chest r/o PE Check Hep panel Check HIV Check 6MWT first then ONO if needed Check PFT's Continue CPAP as prescribed

## 2016-10-26 LAB — HIV ANTIBODY (ROUTINE TESTING W REFLEX): HIV Screen 4th Generation wRfx: NONREACTIVE

## 2016-10-27 ENCOUNTER — Other Ambulatory Visit: Payer: Self-pay

## 2016-10-27 MED ORDER — POTASSIUM CHLORIDE ER 10 MEQ PO TBCR: 10.0000 meq | EXTENDED_RELEASE_TABLET | Freq: Two times a day (BID) | ORAL | 0 refills | Status: DC

## 2016-10-27 MED ORDER — METFORMIN HCL ER 500 MG PO TB24: 2000.0000 mg | ORAL_TABLET | Freq: Every day | ORAL | 0 refills | Status: DC

## 2016-10-27 NOTE — Telephone Encounter (Signed)
Routing to provider. Appt on 11/29/16.

## 2016-11-01 LAB — HM MAMMOGRAPHY

## 2016-11-04 ENCOUNTER — Telehealth: Payer: Self-pay | Admitting: Unknown Physician Specialty

## 2016-11-04 ENCOUNTER — Ambulatory Visit (INDEPENDENT_AMBULATORY_CARE_PROVIDER_SITE_OTHER): Payer: Medicare Other | Admitting: *Deleted

## 2016-11-04 DIAGNOSIS — R0600 Dyspnea, unspecified: Secondary | ICD-10-CM | POA: Diagnosis not present

## 2016-11-04 NOTE — Progress Notes (Signed)
SIX MIN WALK 11/04/2016  Medications Vit D3, Clonazepam, Plavix, Glipizide, Hydralazine, Metformin, MVI, Protonix, Potassium, Zoloft, Valsartan  Supplimental Oxygen during Test? (L/min) No  Laps 4  Partial Lap (in Meters) 24  Baseline BP (sitting) 130/78  Baseline Heartrate 74  Baseline Dyspnea (Borg Scale) 3.5  Baseline Fatigue (Borg Scale) 3.5  Baseline SPO2 100  BP (sitting) 148/90  Heartrate 87  Dyspnea (Borg Scale) 5  Fatigue (Borg Scale) 3.5  SPO2 86  BP (sitting) 140/86  Heartrate 84  SPO2 100  Stopped or Paused before Six Minutes No  Distance Completed 216  Tech Comments: Pt walked at a slow pace.    SMW performed today.

## 2016-11-04 NOTE — Telephone Encounter (Signed)
Called pt to schedule Annual Wellness Visit with Nurse Health Advisor for june:  - knb

## 2016-11-08 ENCOUNTER — Ambulatory Visit: Payer: Medicare Other | Attending: Internal Medicine

## 2016-11-08 DIAGNOSIS — R0602 Shortness of breath: Secondary | ICD-10-CM | POA: Diagnosis not present

## 2016-11-08 DIAGNOSIS — R5383 Other fatigue: Secondary | ICD-10-CM | POA: Insufficient documentation

## 2016-11-08 MED ORDER — ALBUTEROL SULFATE (2.5 MG/3ML) 0.083% IN NEBU
2.5000 mg | INHALATION_SOLUTION | Freq: Once | RESPIRATORY_TRACT | Status: AC
  Administered 2016-11-08: 2.5 mg via RESPIRATORY_TRACT
  Filled 2016-11-08: qty 3

## 2016-11-09 ENCOUNTER — Telehealth: Payer: Self-pay | Admitting: Internal Medicine

## 2016-11-09 NOTE — Telephone Encounter (Signed)
Returned call to patient's daughter. Patient had PFT done yesterday. After receiving Albuterol patient felt better. When she went home she could climb the stairs without getting winded. Patient wants to know if she can get rescue inhaler.

## 2016-11-09 NOTE — Telephone Encounter (Signed)
Pt daughter, Tillie Rung, states pt felt better after her breathing treatment, and pt wonders if she will have to wait 3 months to get another one, or will she be able to get these as needed. Please call and advise

## 2016-11-10 MED ORDER — ALBUTEROL SULFATE HFA 108 (90 BASE) MCG/ACT IN AERS: 2.0000 | INHALATION_SPRAY | RESPIRATORY_TRACT | 2 refills | Status: DC | PRN

## 2016-11-10 NOTE — Telephone Encounter (Signed)
Albuterol 2-4 puffs every 4 hrs as needed

## 2016-11-10 NOTE — Telephone Encounter (Signed)
Rx sent to pharmacy.

## 2016-11-29 ENCOUNTER — Ambulatory Visit (INDEPENDENT_AMBULATORY_CARE_PROVIDER_SITE_OTHER): Payer: Medicare Other | Admitting: Unknown Physician Specialty

## 2016-11-29 ENCOUNTER — Encounter: Payer: Self-pay | Admitting: Unknown Physician Specialty

## 2016-11-29 VITALS — BP 158/84 | HR 65 | Temp 97.6°F | Wt 171.2 lb

## 2016-11-29 DIAGNOSIS — E1122 Type 2 diabetes mellitus with diabetic chronic kidney disease: Secondary | ICD-10-CM | POA: Diagnosis not present

## 2016-11-29 DIAGNOSIS — E782 Mixed hyperlipidemia: Secondary | ICD-10-CM | POA: Diagnosis not present

## 2016-11-29 DIAGNOSIS — F331 Major depressive disorder, recurrent, moderate: Secondary | ICD-10-CM | POA: Diagnosis not present

## 2016-11-29 DIAGNOSIS — I2721 Secondary pulmonary arterial hypertension: Secondary | ICD-10-CM | POA: Insufficient documentation

## 2016-11-29 DIAGNOSIS — G43909 Migraine, unspecified, not intractable, without status migrainosus: Secondary | ICD-10-CM

## 2016-11-29 DIAGNOSIS — I10 Essential (primary) hypertension: Secondary | ICD-10-CM | POA: Diagnosis not present

## 2016-11-29 DIAGNOSIS — D7589 Other specified diseases of blood and blood-forming organs: Secondary | ICD-10-CM

## 2016-11-29 DIAGNOSIS — I270 Primary pulmonary hypertension: Secondary | ICD-10-CM

## 2016-11-29 DIAGNOSIS — N182 Chronic kidney disease, stage 2 (mild): Secondary | ICD-10-CM

## 2016-11-29 LAB — BAYER DCA HB A1C WAIVED: HB A1C: 7.1 % — AB (ref <7.0)

## 2016-11-29 MED ORDER — BUPROPION HCL ER (SR) 150 MG PO TB12: 150.0000 mg | ORAL_TABLET | Freq: Two times a day (BID) | ORAL | 1 refills | Status: DC

## 2016-11-29 NOTE — Assessment & Plan Note (Signed)
resolved 

## 2016-11-29 NOTE — Assessment & Plan Note (Signed)
Normal at home.  Will recheck in 3 months.

## 2016-11-29 NOTE — Assessment & Plan Note (Addendum)
Hasn't seen her counselor since Christmas.  We try to re-establish contact.  Add Wellbutrin daily and increase to BID

## 2016-11-29 NOTE — Patient Instructions (Addendum)
Pulmonary Hypertension Pulmonary hypertension is high blood pressure within the arteries in your lungs (pulmonary arteries). It is different than having high blood pressure elsewhere in your body, such as blood pressure that is measured with a blood pressure cuff. Pulmonary hypertension makes it harder for blood to flow through the lungs. As a result, the heart must work harder to pump blood through the lungs, and it may be harder for you to breathe. Over time, this can weaken the heart muscle. Pulmonary hypertension is a serious condition and it can be fatal. What are the causes? Many different medical conditions can cause pulmonary hypertension. Pulmonary hypertension can be categorized by cause into five groups: Group 1 Pulmonary hypertension that is caused by abnormal growth of small blood vessels in the lungs (pulmonary arterial hypertension). The abnormal blood vessel growth may have no known cause, or it may be:  Passed along from a parent (hereditary).  Caused by another disease, such as a connective tissue disease (including lupus or scleroderma) or HIV.  Caused by certain drugs or toxins.  Group 2 Pulmonary hypertension that is caused by weakness of the main chamber of the heart (left ventricle) or heart valve disease. Group 3 Pulmonary hypertension that is caused by lung disease or low oxygen levels. Causes in this group include:  Emphysema or chronic obstructive pulmonary disease (COPD).  Untreated sleep apnea.  Pulmonary fibrosis.  Group 4 Pulmonary hypertension that is caused by blood clots in the lungs (pulmonary emboli). Group 5 Other causes of pulmonary hypertension, such as sickle cell anemia, or a mix of multiple causes. What are the signs or symptoms? Symptoms of this condition include:  Shortness of breath. You may notice shortness of breath with: ? Activity, such as walking. ? No activity.  Tiredness and fatigue.  Dizziness or fainting.  Rapid heartbeat or  feeling your heart flutter or skip a beat (palpitations).  Neck vein enlargement.  Bluish color to your lips and fingertips.  How is this diagnosed? This condition may be diagnosed by:  Chest X-ray.  Arterial blood gases. This test checks the acidity of your blood as well as your blood oxygen and carbon dioxide levels.  CT scan. This test can provide detailed images of your lungs.  Pulmonary function test. This test measures how much air your lungs can hold. It also tests how well air moves in and out of your lungs.  Electrocardiogram (ECG). This test traces the electrical activity of your heart.  Echocardiogram. This test is used to look at your heart in motion and check how it is functioning.  Heart catheterization. This test can measure the pressure in your pulmonary artery and the right side of your heart.  Lung biopsy. This procedure involves checking a sample of lung tissue to find underlying causes.  How is this treated? There is no cure for pulmonary hypertension, but treatment can help to relieve symptoms and slow the progress of the condition. Treatment can involve:  Medicines, such as: ? Blood pressure medicines. ? Medicines to relax (dilate) the pulmonary blood vessels. ? Water pills to get rid of extra fluid (diuretic medicines). ? Blood-thinning medicines.  Surgery. For severe pulmonary hypertension that does not respond to medical treatment, heart-lung or lung transplant may be needed.  Follow these instructions at home:  Take medicines only as directed by your health care provider. These include over-the-counter medicines and prescription medicines. Take all medicines exactly as instructed. Do not change or stop medicines without first checking with your health   care provider.  Do not smoke. If you need help quitting, ask your health care provider.  Eat a healthy diet.  Limit your salt (sodium) intake to less than 2,300 mg per day.  Stay as active as  possible. Exercise as directed by your health care provider. Talk with your health care provider about what type of exercise is safe for you.  Avoid high altitudes.  Avoid hot tubs and saunas.  Avoid becoming pregnant, if this applies. Talk with your health care provider about safe methods of birth control.  Keep all follow-up visits as directed by your health care provider. This is important. Get help right away if:  You have severe shortness of breath.  You develop chest pain or pressure in your chest.  You cough up blood.  You develop swelling of your feet or legs.  You have a significant increase in weight within 1-2 days. This information is not intended to replace advice given to you by your health care provider. Make sure you discuss any questions you have with your health care provider. Document Released: 03/20/2007 Document Revised: 12/11/2015 Document Reviewed: 11/12/2012 Elsevier Interactive Patient Education  2018 [employer]  

## 2016-11-29 NOTE — Assessment & Plan Note (Signed)
Per pulmonary.  Notes reviewed.  Discussed and corrected inhaler technique

## 2016-11-29 NOTE — Addendum Note (Signed)
Addended by: Kathrine Haddock on: 11/29/2016 10:09 AM   Modules accepted: Orders

## 2016-11-29 NOTE — Assessment & Plan Note (Signed)
Hgb A1C is 7.1%.  Don't make any changes now.  Will hopefully increase exercise when pulmonary hypertension is properly treated.  ? Pulmonary rehab

## 2016-11-29 NOTE — Progress Notes (Signed)
BP (!) 158/84 (Cuff Size: Normal)   Pulse 65   Temp 97.6 F (36.4 C)   Wt 171 lb 3.2 oz (77.7 kg)   LMP  (LMP Unknown)   SpO2 100%   BMI 27.63 kg/m    Subjective:    Patient ID: Jamie Murray, female    DOB: 11-07-1945, 71 y.o.   MRN: [id number]  HPI: Jamie Murray is a 71 y.o. female  Chief Complaint  Patient presents with  . Anxiety    pt unsure of which dosage of sertraline she is taking   . Diabetes  . Hypertension   Diabetes: Using medications without difficulties No hypoglycemic episodes No hyperglycemic episodes Feet problems: edema but no numbness or tingling Blood Sugars averaging: 13-140's eye exam within last year Last Hgb A1C: 7.4  Hypertension  Using medications without difficulty Average home BPs "they run fine at home"  130/70's  Using medication without problems or lightheadedness No chest pain with exertion or shortness of breath Swelling in legs  Elevated Cholesterol Using medications without problems No Muscle aches  Diet: Exercise:  SOB Seeing pulmonary.  Significant DOE.  Getting a pulmonary function chest.  She does not smoke but significant smoking exposure.  Noted   Depression States she is depressed at this time due to symptoms.  Unable to even walk across the road.   Depression screen [facility] 2/9 11/29/2016 11/29/2016 07/22/2015 04/28/2015  Decreased Interest 3 3 0 1  Down, Depressed, Hopeless 3 3 0 0  PHQ - 2 Score 6 6 0 1  Altered sleeping 3 3 - -  Tired, decreased energy 3 3 - -  Change in appetite 2 2 - -  Feeling bad or failure about yourself  3 3 - -  Trouble concentrating 2 2 - -  Moving slowly or fidgety/restless 0 0 - -  Suicidal thoughts 2 2 - -  PHQ-9 Score 21 21 - -        Relevant past medical, surgical, family and social history reviewed and updated as indicated. Interim medical history since our last visit reviewed. Allergies and medications reviewed and updated.  Review of Systems  Per HPI  unless specifically indicated above     Objective:    BP (!) 158/84 (Cuff Size: Normal)   Pulse 65   Temp 97.6 F (36.4 C)   Wt 171 lb 3.2 oz (77.7 kg)   LMP  (LMP Unknown)   SpO2 100%   BMI 27.63 kg/m   Wt Readings from Last 3 Encounters:  11/29/16 171 lb 3.2 oz (77.7 kg)  10/25/16 166 lb (75.3 kg)  08/26/16 168 lb 9.6 oz (76.5 kg)    Physical Exam  Constitutional: She is oriented to person, place, and time. She appears well-developed and well-nourished. No distress.  HENT:  Head: Normocephalic and atraumatic.  Eyes: Conjunctivae and lids are normal. Right eye exhibits no discharge. Left eye exhibits no discharge. No scleral icterus.  Neck: Normal range of motion. Neck supple. No JVD present. Carotid bruit is not present.  Cardiovascular: Normal rate, regular rhythm and normal heart sounds.   Pulmonary/Chest: Effort normal and breath sounds normal.  Abdominal: Normal appearance. There is no splenomegaly or hepatomegaly.  Musculoskeletal: Normal range of motion.  Neurological: She is alert and oriented to person, place, and time.  Skin: Skin is warm, dry and intact. No rash noted. No pallor.  Psychiatric: She has a normal mood and affect. Her behavior is normal. Judgment and thought content  normal.      Assessment & Plan:   Problem List Items Addressed This Visit      Unprioritized   Depression    Hasn't seen her counselor since Christmas.  We try to re-establish contact.  Add Wellbutrin daily and increase to BID      Relevant Medications   sertraline (ZOLOFT) 100 MG tablet   buPROPion (WELLBUTRIN SR) 150 MG 12 hr tablet   DM type 2 (diabetes mellitus, type 2) (HCC) - Primary    Hgb A1C is 7.1%.  Don't make any changes now.  Will hopefully increase exercise when pulmonary hypertension is properly treated.  ? Pulmonary rehab      Relevant Medications   valsartan (DIOVAN) 160 MG tablet   Other Relevant Orders   Bayer DCA Hb A1c Waived   Hyperlipidemia   Relevant  Medications   valsartan (DIOVAN) 160 MG tablet   Other Relevant Orders   Lipid Panel w/o Chol/HDL Ratio   Hypertension    Normal at home.  Will recheck in 3 months.        Relevant Medications   valsartan (DIOVAN) 160 MG tablet   Other Relevant Orders   Comprehensive metabolic panel   RESOLVED: Migraine    resolved      Relevant Medications   valsartan (DIOVAN) 160 MG tablet   sertraline (ZOLOFT) 100 MG tablet   buPROPion (WELLBUTRIN SR) 150 MG 12 hr tablet   Pulmonary hypertension, primary ([place])    Per pulmonary.  Notes reviewed.  Discussed and corrected inhaler technique      Relevant Medications   valsartan (DIOVAN) 160 MG tablet       Follow up plan: Return in about 3 months (around 03/01/2017).

## 2016-12-01 LAB — COMPREHENSIVE METABOLIC PANEL
ALK PHOS: 114 IU/L (ref 39–117)
ALT: 52 IU/L — ABNORMAL HIGH (ref 0–32)
AST: 48 IU/L — AB (ref 0–40)
Albumin/Globulin Ratio: 1.5 (ref 1.2–2.2)
Albumin: 4 g/dL (ref 3.5–4.8)
BUN/Creatinine Ratio: 12 (ref 12–28)
BUN: 13 mg/dL (ref 8–27)
Bilirubin Total: 0.8 mg/dL (ref 0.0–1.2)
CALCIUM: 8.9 mg/dL (ref 8.7–10.3)
CO2: 18 mmol/L — AB (ref 20–29)
CREATININE: 1.05 mg/dL — AB (ref 0.57–1.00)
Chloride: 105 mmol/L (ref 96–106)
GFR calc Af Amer: 62 mL/min/{1.73_m2} (ref >59)
GFR, EST NON AFRICAN AMERICAN: 54 mL/min/{1.73_m2} — AB (ref >59)
GLOBULIN, TOTAL: 2.6 g/dL (ref 1.5–4.5)
GLUCOSE: 159 mg/dL — AB (ref 65–99)
Potassium: 4.2 mmol/L (ref 3.5–5.2)
SODIUM: 139 mmol/L (ref 134–144)
Total Protein: 6.6 g/dL (ref 6.0–8.5)

## 2016-12-01 LAB — VITAMIN B12: VITAMIN B 12: 422 pg/mL (ref 232–1245)

## 2016-12-01 LAB — LIPID PANEL W/O CHOL/HDL RATIO
CHOLESTEROL TOTAL: 95 mg/dL — AB (ref 100–199)
HDL: 29 mg/dL — AB (ref >39)
LDL CALC: 45 mg/dL (ref 0–99)
TRIGLYCERIDES: 105 mg/dL (ref 0–149)
VLDL CHOLESTEROL CAL: 21 mg/dL (ref 5–40)

## 2016-12-05 ENCOUNTER — Telehealth: Payer: Self-pay | Admitting: Internal Medicine

## 2016-12-05 NOTE — Telephone Encounter (Signed)
appt has been scheduled to see Dr. Mortimer Fries 7/31. Patient daughter asked if she could bring her mother by office for education on rescue inhaler on Thurs, or Fri. Patient sounded a little resistant. Nothing further needed at this time.

## 2016-12-05 NOTE — Telephone Encounter (Signed)
Pt daughter calling us back  Please use the work number provided

## 2016-12-05 NOTE — Telephone Encounter (Signed)
Pt daughter calling having some concerns for patient/not sure if she should wait until August to see Dr Mortimer Fries  She has some concerns  She states the breathing treatment we did for patient worked really well for patient They were trying to see if there is something like that, where they may be able to do it at home She states patient has the inhaler but patient is not using it correctly and its not helping her as it did when she did the breathing treatment here In office.   She is also asking if Dr Mortimer Fries has looked into patient may needing a cath   Please advise.

## 2016-12-05 NOTE — Telephone Encounter (Signed)
Left message for patient's daughter to call back.

## 2016-12-12 ENCOUNTER — Telehealth: Payer: Self-pay | Admitting: Unknown Physician Specialty

## 2016-12-12 NOTE — Telephone Encounter (Signed)
Patient would like to talk with Malachy Mood or [place] regarding the swelling in her ankles. She is very concerned about this.  Thank You  [phone]

## 2016-12-12 NOTE — Telephone Encounter (Signed)
Called and spoke to patient. She states that both of her ankles are swelling but states that she has not been out in the heat and has been trying to drink more water. Patient would like to know what she can do to reduce the swelling in her ankles. While on the phone patient also states that her daughter wanted her to ask if the bupropion and sertraline were ok or good to take together.

## 2016-12-12 NOTE — Telephone Encounter (Signed)
Called and let patient know what Jamie Murray said. Patient states that she is going to talk to her daughter and she will let us know if she wants to be started on a fluid pill.

## 2016-12-12 NOTE — Telephone Encounter (Signed)
We could give her a fluid pill but will need to be cautions due to history of magnesium deficiency. She can use compression stockings.  If she would like a fluid pill, I am happy to prescribe but electrolyte levels would need to be monitored very carefully.    It is OK tp take Buprorion and Sertraline together, especially with her relatively low dose of Sertraline she is on.

## 2017-01-03 ENCOUNTER — Encounter: Payer: Self-pay | Admitting: Internal Medicine

## 2017-01-03 ENCOUNTER — Ambulatory Visit (INDEPENDENT_AMBULATORY_CARE_PROVIDER_SITE_OTHER): Payer: Medicare Other | Admitting: Internal Medicine

## 2017-01-03 VITALS — BP 130/88 | HR 73 | Resp 16 | Ht 66.0 in | Wt 158.0 lb

## 2017-01-03 DIAGNOSIS — I272 Pulmonary hypertension, unspecified: Secondary | ICD-10-CM | POA: Diagnosis not present

## 2017-01-03 DIAGNOSIS — J449 Chronic obstructive pulmonary disease, unspecified: Secondary | ICD-10-CM | POA: Diagnosis not present

## 2017-01-03 MED ORDER — IPRATROPIUM-ALBUTEROL 0.5-2.5 (3) MG/3ML IN SOLN: 3.0000 mL | RESPIRATORY_TRACT | 12 refills | Status: DC | PRN

## 2017-01-03 NOTE — Patient Instructions (Addendum)
repeat ECHO in 3 months GI consult recommended Dounebs every 4 hrs as needed Avoid second hand smoke exposure

## 2017-01-03 NOTE — Progress Notes (Signed)
Name: Jamie Murray MRN: [id number] DOB: June 25, 1945    CONSULTATION DATE: 01/03/2017   REFERRING MD : Julian Hy NP  CHIEF COMPLAINT:  Assessment for Pulmonary HTN  Previous medical history Patient did not have MI but was told to see cardiologist as outpatient-patient underwent cardiac work up and lab testing Patient obtained ECHO and was noted in report to have severe pulmonary HTN  Patient has been having SOB and DOE for several months with LE swelling She also feels very tired all the time She sees DR [facility] for 10 years She was recently DX with OSA on CPAP therapy 10 cm h20 AHI is 0.9 with current therapy  She has been having lower ext swelling for several months She has extensive second hand smoking history for 26 years    History of present illness Patient here for follow-up test results  Follow-up today for a function testing which shows an FEV1 FEC ratio of 86% predicted Has FEV1 FEV1 of 1.25 L at 50% predicted her FVC is 1.46 L which is 46% predicted her TLC is 78% predicted DLCO is 64% however it is 113% predicted when uncorrected for volumes Interpretation: moderate restrictive lung disease with a positive bronchodilator response findings to suggest underlying reactive airways disease and small airways obstructive disease with bronchodilator response Flow volume loops suggest scooping of the expiratory limb Findings suggest underlying mild COPD Patient states that she had a nebulizer treatment and that has significantly improved her shortness of breath Her PCP obtained a CT abdomen which shows ascites and pleural effusion and etiology is still unknown Patient probably has pleural effusions does not  any type of invasive procedure at this time     REVIEW OF SYSTEMS:   Constitutional: Negative for fever, chills, weight loss, malaise/fatigue and diaphoresis.  HENT: Negative for hearing loss, ear pain, nosebleeds, congestion, sore throat, neck pain,  tinnitus and ear discharge.   Eyes: Negative for blurred vision, double vision, photophobia, pain, discharge and redness.  Respiratory: -hemoptysis, -sputum production, +shortness of breath, -wheezing and- stridor.   Cardiovascular: Negative for chest pain, palpitations, orthopnea, claudication, +leg swelling and +PND.  Gastrointestinal: Negative for heartburn, nausea, vomiting, abdominal pain, diarrhea, constipation, blood in stool and melena.  + Bloating   ALL OTHER ROS ARE NEGATIVE   Ht _0  (1.676 m)   LMP  (LMP Unknown)    Physical Examination:   GENERAL:NAD, no fevers, chills, + fatigue+weakness HEAD: Normocephalic, atraumatic.  EYES: Pupils equal, round, reactive to light. Extraocular muscles intact. No scleral icterus.  MOUTH: Moist mucosal membrane.   EAR, NOSE, THROAT: Clear without exudates. No external lesions.  NECK: Supple. No thyromegaly. No nodules. No JVD.  PULMONARY:CTA B/L no wheezes, no crackles, no rhonchi CARDIOVASCULAR: S1 and S2. Regular rate and rhythm. No murmurs, rubs, or gallops. No edema.  GASTROINTESTINAL: Soft, nontender, nondistended. No masses. Positive bowel sounds.  PSYCHIATRIC: Mood, affect within normal limits. The patient is awake, alert and oriented x 3. Insight, judgment intact.     I have Independently reviewed images of CXR on 08/09/2016     Interpretation:No infiltrate or pulmonary edema   ASSESSMENT / PLAN:  71 yo AAF with ongoing SOB and DOE with lower ext swelling and edema with signs of cor pulmonale with elevated pulm artery pressures on ECHO with dx of underlying OSA with extensive second hand smoke exposure  At this time I believe her elevated PA pressures on echocardiogram I related to her underlying intermittent reactive airways disease with  small airways obstructive disease along with her diagnosis of obstructive sleep apnea which correlates with secondary pulmonary hypertension related to other underlying disorders. In the  setting of a normal 6 minute walk test patient does not have exertional hypoxia    1.SOB/DOE-causes are multifactorial Mild COPD , diastolic dysfunction, increased abdominal pressure and possible pleural effusions  2.OSA on CPAP -continue CPAP therapy of 10 cm h20 with AHI 0.9  3.Extensive smoking exposure mild small airways obstructive disease -Avoid secondhand smoke Will start DuoNeb's as needed A need to consider inhaled corticosteroid tip therapy and long-acting beta agonist or anticholinergic therapy next visit yet  4.elevated Pulm art pressures, cor pulmonale with effusions After further assessment I do believe her PA pressures are related to underlying sleep apnea as well as small airways obstructive disease Will defer cardiology assessment at this time and will repeat an echocardiogram and assess PA pressures in 3 months Continue Lasix as prescribed  #5 ascites with effusions Refer to GI for further evaluation and assessment of liver dysfunction  Follow-up in 3 months    Patient/Family are satisfied with Plan of action and management. All questions answered  Corrin Parker, M.D.  [facility]  Medical Director [place] Director [facility]io-P[facility] Department

## 2017-01-05 ENCOUNTER — Ambulatory Visit
Admission: EM | Admit: 2017-01-05 | Discharge: 2017-01-05 | Disposition: A | Discharge status: Home / Self Care | Payer: Medicare Other | Attending: Family Medicine | Admitting: Family Medicine

## 2017-01-05 ENCOUNTER — Encounter: Payer: Self-pay | Admitting: Emergency Medicine

## 2017-01-05 DIAGNOSIS — J029 Acute pharyngitis, unspecified: Secondary | ICD-10-CM

## 2017-01-05 DIAGNOSIS — R05 Cough: Secondary | ICD-10-CM

## 2017-01-05 DIAGNOSIS — J22 Unspecified acute lower respiratory infection: Secondary | ICD-10-CM

## 2017-01-05 LAB — RAPID STREP SCREEN (MED CTR MEBANE ONLY): STREPTOCOCCUS, GROUP A SCREEN (DIRECT): NEGATIVE

## 2017-01-05 MED ORDER — BENZONATATE 200 MG PO CAPS: 200.0000 mg | ORAL_CAPSULE | Freq: Three times a day (TID) | ORAL | 0 refills | Status: DC | PRN

## 2017-01-05 MED ORDER — CEFUROXIME AXETIL 500 MG PO TABS: 500.0000 mg | ORAL_TABLET | Freq: Two times a day (BID) | ORAL | 0 refills | Status: DC

## 2017-01-05 NOTE — ED Provider Notes (Signed)
[facility]    CSN: [id number] Arrival date & time: 01/05/17  0830     History   Chief Complaint Chief Complaint  Patient presents with  . Cough  . Sore Throat    HPI Jamie Murray is a 71 y.o. female.   Patient is a 48-year-old black female multiple medical problems. She states that she's had this chest congestion and tightness started about 3 days ago husband recently had pneumonia she's worried that she maybe gets pneumonia from him. She states it feels that something is catching in her chest and hitting his chest wall when she coughs. She is does not have fever cough is nonproductive the husband keeps trying to get her to cough something up. Sclerae wet his progress in the last 3 days. She got multiple medical problems with hypertension of recurrent chest pain prolonged QT interval diabetes hypertension etc. area no pertinent family medical history relevant for today's visit other than her husband have pneumonia recently she does not smoke no known drug allergies previous surgeries abdominal hysterectomy coronary angioplasty and esophagogastroduodenoscopy tubal ligation. She does not smoke.   The history is provided by the patient. No language interpreter was used.  Cough  Cough characteristics:  Non-productive Severity:  Moderate Duration:  3 days Timing:  Constant Progression:  Worsening Chronicity:  New Smoker: no   Context: sick contacts   Relieved by:  Nothing Worsened by:  Deep breathing Ineffective treatments:  None tried Associated symptoms: sore throat   Associated symptoms: no chest pain, no fever and no shortness of breath   Risk factors: no recent infection   Sore Throat  Pertinent negatives include no chest pain and no shortness of breath.    Past Medical History:  Diagnosis Date  . Allergy   . Anemia   . Anxiety   . Depression   . Diabetes ([place])   . GERD (gastroesophageal reflux disease)   . Hyperlipidemia   . Hypertension   .  Insomnia   . Migraines   . Osteoporosis   . [facility] spotted fever   . Transient cerebral ischemia   . Vertigo    every 2-3 months    Patient Active Problem List   Diagnosis Date Noted  . Pulmonary hypertension, primary ([place]) 11/29/2016  . Mobility impaired 08/26/2016  . Chest pain, rule out acute myocardial infarction 08/09/2016  . Chronic fatigue 05/11/2016  . Anemia 05/11/2016  . Depression 05/03/2016  . Hypomagnesemia 03/22/2016  . Prolonged Q-T interval on ECG 03/22/2016  . Macrocytosis 01/25/2016  . Diarrhea, functional 01/25/2016  . Hypokalemia 12/22/2015  . Heartburn   . Hiatal hernia   . Stricture and stenosis of esophagus   . Gastroesophageal reflux disease without esophagitis 07/22/2015  . Urinary tract infection 04/28/2015  . Osteoporosis 01/26/2015  . Insomnia 01/26/2015  . Allergic rhinitis 01/26/2015  . Anxiety and depression 01/26/2015  . Hypothyroidism 01/26/2015  . Hypertension 01/26/2015  . DM type 2 (diabetes mellitus, type 2) ([place]) 01/26/2015  . Hyperlipidemia 01/26/2015  . CVA (cerebral infarction) 01/26/2015  . Chronic tension-type headache, intractable 10/06/2014  . Disordered sleep 10/06/2014    Past Surgical History:  Procedure Laterality Date  . ABDOMINAL HYSTERECTOMY    . CORONARY ANGIOPLASTY    . ESOPHAGOGASTRODUODENOSCOPY (EGD) WITH PROPOFOL N/A 08/24/2015   Procedure: ESOPHAGOGASTRODUODENOSCOPY (EGD) WITH PROPOFOL with dialation;  Surgeon: Lucilla Lame, MD;  Location: [facility];  Service: Endoscopy;  Laterality: N/A;  Diabetic - oral meds  . TUBAL LIGATION  OB History    No data available       Home Medications    Prior to Admission medications   Medication Sig Start Date End Date Taking? Authorizing Provider  albuterol (PROVENTIL HFA;VENTOLIN HFA) 108 (90 Base) MCG/ACT inhaler Inhale 2 puffs into the lungs every 4 (four) hours as needed for wheezing or shortness of breath. 11/10/16   Flora Lipps, MD  aspirin  81 MG tablet Take 81 mg by mouth daily.    [provider]  atorvastatin (LIPITOR) 10 MG tablet Take 1 tablet (10 mg total) by mouth daily. 06/08/16   Kathrine Haddock, NP  benzonatate (TESSALON) 200 MG capsule Take 1 capsule (200 mg total) by mouth 3 (three) times daily as needed. 01/05/17   Frederich Cha, MD  Blood Glucose Monitoring Suppl ([place]) [place] See admin instructions. 02/23/16   [provider]  buPROPion (WELLBUTRIN SR) 150 MG 12 hr tablet Take 1 tablet (150 mg total) by mouth 2 (two) times daily. Take 1 tablet in the morning for 2 weeks then take twice and day. 11/29/16   Kathrine Haddock, NP  cefUROXime (CEFTIN) 500 MG tablet Take 1 tablet (500 mg total) by mouth 2 (two) times daily. 01/05/17   Frederich Cha, MD  Cholecalciferol (VITAMIN D3) 2000 units TABS Take 2,000 Units by mouth daily.    [provider]  clonazePAM (KLONOPIN) 1 MG tablet Take 1 mg by mouth at bedtime. 09/29/16   [provider]  clopidogrel (PLAVIX) 75 MG tablet Take 1 tablet (75 mg total) by mouth daily. 10/04/16   Kathrine Haddock, NP  Dulaglutide (TRULICITY) 1.5 XQ/1.1HE SOPN Inject 1.5 mg into the skin once a week. 06/08/16   Kathrine Haddock, NP  EASY TOUCH TEST test strip USE TO CHECK BLOOD SUGAR ONCE DAILY 02/23/16   [provider]  furosemide (LASIX) 20 MG tablet Take 20 mg by mouth daily as needed. 12/15/16   [provider]  glipiZIDE (GLUCOTROL XL) 5 MG 24 hr tablet Take 2 tablets (10 mg total) by mouth daily with breakfast. 08/26/16   Kathrine Haddock, NP  hydrALAZINE (APRESOLINE) 50 MG tablet Take 100 mg by mouth 2 (two) times daily. 12/26/16   [provider]  ipratropium-albuterol (DUONEB) 0.5-2.5 (3) MG/3ML SOLN Take 3 mLs by nebulization every 4 (four) hours as needed. 01/03/17   Flora Lipps, MD  isosorbide mononitrate (IMDUR) 30 MG 24 hr tablet Take 1 tablet (30 mg total) by mouth daily. 08/11/16   Epifanio Lesches, MD  LORazepam (ATIVAN)  0.5 MG tablet Take 1 tablet (0.5 mg total) by mouth daily as needed. 09/30/16   Kathrine Haddock, NP  losartan (COZAAR) 100 MG tablet Take 100 mg by mouth daily. 12/29/16   [provider]  metFORMIN (GLUCOPHAGE-XR) 500 MG 24 hr tablet Take 4 tablets (2,000 mg total) by mouth daily with breakfast. 10/27/16   Park Liter P, DO  metoprolol succinate (TOPROL-XL) 100 MG 24 hr tablet Take 100 mg by mouth daily.  04/29/16   [provider]  Multiple Vitamin (MULTIVITAMIN) tablet Take 1 tablet by mouth daily.    [provider]  ondansetron (ZOFRAN) 4 MG tablet Take 1 tablet (4 mg total) by mouth every 8 (eight) hours as needed for nausea or vomiting. 08/10/16   Epifanio Lesches, MD  pantoprazole (PROTONIX) 40 MG tablet Take 1 tablet (40 mg total) by mouth 2 (two) times daily. 09/26/16   Kathrine Haddock, NP  potassium chloride (KLOR-CON 10) 10 MEQ  tablet Take 1 tablet (10 mEq total) by mouth 2 (two) times daily. 10/27/16   Johnson, Megan P, DO  sertraline (ZOLOFT) 100 MG tablet Take 100 mg by mouth daily. 11/07/16   [provider]  spironolactone (ALDACTONE) 25 MG tablet Take 25 mg by mouth daily. 12/26/16   [provider]    Family History Family History  Problem Relation Age of Onset  . Diabetes Mother   . Hypertension Mother   . Stroke Mother   . Heart disease Mother        MI  . Cancer Father        pancreatic  . Stroke Father   . Hypertension Sister   . Diabetes Brother   . Diabetes Maternal Grandfather   . Diabetes Sister   . Diabetes Sister   . Diabetes Brother   . Diabetes Brother     Social History Social History  Substance Use Topics  . Smoking status: Never Smoker  . Smokeless tobacco: Never Used  . Alcohol use No     Allergies   Prozac [fluoxetine hcl]   Review of Systems Review of Systems  Constitutional: Negative for fever.  HENT: Positive for sore throat.   Respiratory: Positive for cough. Negative for shortness of  breath.   Cardiovascular: Negative for chest pain.  All other systems reviewed and are negative.    Physical Exam Triage Vital Signs ED Triage Vitals  Enc Vitals Group     BP 01/05/17 0845 (!) 156/64     Pulse Rate 01/05/17 0845 62     Resp 01/05/17 0845 16     Temp 01/05/17 0845 97.6 F (36.4 C)     Temp Source 01/05/17 0845 Oral     SpO2 01/05/17 0845 99 %     Weight 01/05/17 0842 157 lb (71.2 kg)     Height 01/05/17 0842 _0  (1.702 m)     Head Circumference --      Peak Flow --      Pain Score 01/05/17 0842 4     Pain Loc --      Pain Edu? --      Excl. in [place]? --    No data found.   Updated Vital Signs BP (!) 156/64 (BP Location: Left Arm)   Pulse 62   Temp 97.6 F (36.4 C) (Oral)   Resp 16   Ht _1  (1.702 m)   Wt 157 lb (71.2 kg)   LMP  (LMP Unknown)   SpO2 99%   BMI 24.59 kg/m   Visual Acuity Right Eye Distance:   Left Eye Distance:   Bilateral Distance:    Right Eye Near:   Left Eye Near:    Bilateral Near:     Physical Exam  Constitutional: She is oriented to person, place, and time. She appears well-developed and well-nourished.  HENT:  Head: Normocephalic.  Right Ear: Hearing, tympanic membrane, external ear and ear canal normal.  Left Ear: Hearing, tympanic membrane, external ear and ear canal normal.  Nose: Mucosal edema present. Right sinus exhibits no maxillary sinus tenderness and no frontal sinus tenderness. Left sinus exhibits no maxillary sinus tenderness and no frontal sinus tenderness.  Mouth/Throat: Uvula is midline. No uvula swelling. Posterior oropharyngeal erythema present.  Eyes: Pupils are equal, round, and reactive to light.  Neck: Normal range of motion. Neck supple.  Cardiovascular: Normal rate.   Pulmonary/Chest: Effort normal.  Musculoskeletal: Normal range of motion.  Neurological: She is alert and  oriented to person, place, and time. No cranial nerve deficit.  Skin: Skin is warm.  Psychiatric: She has a normal mood  and affect.  Vitals reviewed.    UC Treatments / Results  Labs (all labs ordered are listed, but only abnormal results are displayed) Labs Reviewed  RAPID STREP SCREEN (NOT AT [facility])  CULTURE, GROUP A STREP [facility])    EKG  EKG Interpretation None       Radiology No results found.  Procedures Procedures (including critical care time)  Medications Ordered in UC Medications - No data to display  Results for orders placed or performed during the hospital encounter of 01/05/17  Rapid strep screen  Result Value Ref Range   Streptococcus, Group A Screen (Direct) NEGATIVE NEGATIVE    Initial Impression / Assessment and Plan / UC Course  I have reviewed the triage vital signs and the nursing notes.  Pertinent labs & imaging results that were available during my care of the patient were reviewed by me and considered in my medical decision making (see chart for details).   patient didn't cooperate well with the attempt to get strep test. Which was negative. Here because her multitude of medical problems age and comorbidities will go ahead and place on anabiotic emotions under 63 days that she was placed on azithromycin but because of her QT prolongation with place on Ceftin 500 mg 1 tablet twice a day Tessalon Perles 2 times a day 5 PCP 1 week if not better.    Final Clinical Impressions(s) / UC Diagnoses   Final diagnoses:  Lower respiratory tract infection  Pharyngitis, unspecified etiology    New Prescriptions New Prescriptions   BENZONATATE (TESSALON) 200 MG CAPSULE    Take 1 capsule (200 mg total) by mouth 3 (three) times daily as needed.   CEFUROXIME (CEFTIN) 500 MG TABLET    Take 1 tablet (500 mg total) by mouth 2 (two) times daily.     Note: This dictation was prepared with Dragon dictation along with smaller phrase technology. Any transcriptional errors that result from this process are unintentional.   Frederich Cha, MD 01/05/17 [phone]

## 2017-01-05 NOTE — ED Triage Notes (Signed)
Patient c/o sore throat, cough, runny nose, and chest congestion started 2 days ago.  Patient denies fevers.

## 2017-01-08 LAB — CULTURE, GROUP A STREP (THRC)

## 2017-01-12 ENCOUNTER — Telehealth: Payer: Self-pay | Admitting: Internal Medicine

## 2017-01-12 NOTE — Telephone Encounter (Signed)
Returned call to [facility] and request that Echo be faxed. Nothing further needed.

## 2017-01-12 NOTE — Telephone Encounter (Signed)
Patient went to [facility] to sign an roi to obtain records for [place].  Per office they have already sent notes .  Is there something else we need. Please call. [phone].

## 2017-01-12 NOTE — Telephone Encounter (Signed)
Error

## 2017-01-23 ENCOUNTER — Other Ambulatory Visit: Payer: Self-pay

## 2017-01-23 NOTE — Telephone Encounter (Signed)
Didn't she transfer her records?

## 2017-01-24 NOTE — Telephone Encounter (Signed)
Called and spoke to patient. She states that she is seeing her new provider on Thursday and is not out of any of her medications that she knows of. I asked for her to give [place] a call if she needs anything from [place].

## 2017-01-31 ENCOUNTER — Telehealth: Payer: Self-pay | Admitting: Internal Medicine

## 2017-01-31 NOTE — Telephone Encounter (Signed)
Pt daughter calling asking if we received notes from Dr Berniece Andreas Khan's office  And if so has Dr Mortimer Fries been able to review it  Please call back

## 2017-01-31 NOTE — Telephone Encounter (Signed)
Returned call to patient's daughter. Informed that we have not received most recent imaging performed on her mother. Asked if possible she could obtain copies and drop off for Dr. Mortimer Fries to review. She seems to thinks perhaps something needs to be address sooner than 10/18 f/u appt. States mother as new found pulmonary nodules. She will obtain documents and drop off for MD review.

## 2017-02-10 ENCOUNTER — Telehealth: Payer: Self-pay | Admitting: Internal Medicine

## 2017-02-10 NOTE — Telephone Encounter (Signed)
Daughter dropped off report from recnet CT scans from [name] in [place].   Placed in [place] in [place]

## 2017-02-10 NOTE — Telephone Encounter (Signed)
Results placed in DK's folder for his review. Nothing further needed.

## 2017-02-13 ENCOUNTER — Other Ambulatory Visit: Payer: Self-pay | Admitting: *Deleted

## 2017-02-13 DIAGNOSIS — I272 Pulmonary hypertension, unspecified: Secondary | ICD-10-CM

## 2017-02-27 ENCOUNTER — Encounter: Payer: Medicare Other | Attending: Internal Medicine

## 2017-02-27 VITALS — Ht 67.5 in | Wt 151.5 lb

## 2017-02-27 DIAGNOSIS — I272 Pulmonary hypertension, unspecified: Secondary | ICD-10-CM | POA: Insufficient documentation

## 2017-02-27 NOTE — Progress Notes (Signed)
Pulmonary Individual Treatment Plan  Patient Details  Name: Jamie Murray MRN: [id number] Date of Birth: 1946/04/22 Referring Provider:     Pulmonary Rehab from 02/27/2017 in [facility] Cardiac and Pulmonary Rehab  Referring Provider  Trellis Moment MD      Initial Encounter Date:    Pulmonary Rehab from 02/27/2017 in [facility] Cardiac and Pulmonary Rehab  Date  02/27/17  Referring Provider  Trellis Moment MD      Visit Diagnosis: Pulmonary hypertension ([place])  Patient's Home Medications on Admission:  Current Outpatient Prescriptions:  .  albuterol (PROVENTIL HFA;VENTOLIN HFA) 108 (90 Base) MCG/ACT inhaler, Inhale 2 puffs into the lungs every 4 (four) hours as needed for wheezing or shortness of breath., Disp: 1 Inhaler, Rfl: 2 .  aspirin 81 MG tablet, Take 81 mg by mouth daily., Disp: , Rfl:  .  atorvastatin (LIPITOR) 10 MG tablet, Take 1 tablet (10 mg total) by mouth daily., Disp: 90 tablet, Rfl: 3 .  benzonatate (TESSALON) 200 MG capsule, Take 1 capsule (200 mg total) by mouth 3 (three) times daily as needed., Disp: 30 capsule, Rfl: 0 .  Blood Glucose Monitoring Suppl (GNP EASY TOUCH GLUCOSE METER) DEVI, See admin instructions., Disp: , Rfl: 0 .  buPROPion (WELLBUTRIN SR) 150 MG 12 hr tablet, Take 1 tablet (150 mg total) by mouth 2 (two) times daily. Take 1 tablet in the morning for 2 weeks then take twice and day., Disp: 180 tablet, Rfl: 1 .  cefUROXime (CEFTIN) 500 MG tablet, Take 1 tablet (500 mg total) by mouth 2 (two) times daily., Disp: 20 tablet, Rfl: 0 .  Cholecalciferol (VITAMIN D3) 2000 units TABS, Take 2,000 Units by mouth daily., Disp: , Rfl:  .  clonazePAM (KLONOPIN) 1 MG tablet, Take 1 mg by mouth at bedtime., Disp: , Rfl: 0 .  clopidogrel (PLAVIX) 75 MG tablet, Take 1 tablet (75 mg total) by mouth daily., Disp: 90 tablet, Rfl: 3 .  Dulaglutide (TRULICITY) 1.5 BW/6.2MB SOPN, Inject 1.5 mg into the skin once a week., Disp: 12 pen, Rfl: 3 .  EASY TOUCH TEST test strip, USE TO  CHECK BLOOD SUGAR ONCE DAILY, Disp: , Rfl: 0 .  furosemide (LASIX) 20 MG tablet, Take 20 mg by mouth daily as needed., Disp: , Rfl: 2 .  glipiZIDE (GLUCOTROL XL) 5 MG 24 hr tablet, Take 2 tablets (10 mg total) by mouth daily with breakfast., Disp: 60 tablet, Rfl: 2 .  hydrALAZINE (APRESOLINE) 50 MG tablet, Take 100 mg by mouth 2 (two) times daily., Disp: , Rfl: 1 .  ipratropium-albuterol (DUONEB) 0.5-2.5 (3) MG/3ML SOLN, Take 3 mLs by nebulization every 4 (four) hours as needed., Disp: 360 mL, Rfl: 12 .  isosorbide mononitrate (IMDUR) 30 MG 24 hr tablet, Take 1 tablet (30 mg total) by mouth daily., Disp: 30 tablet, Rfl: 0 .  LORazepam (ATIVAN) 0.5 MG tablet, Take 1 tablet (0.5 mg total) by mouth daily as needed., Disp: 30 tablet, Rfl: 1 .  losartan (COZAAR) 100 MG tablet, Take 100 mg by mouth daily., Disp: , Rfl: 6 .  metFORMIN (GLUCOPHAGE-XR) 500 MG 24 hr tablet, Take 4 tablets (2,000 mg total) by mouth daily with breakfast., Disp: 360 tablet, Rfl: 0 .  metoprolol succinate (TOPROL-XL) 100 MG 24 hr tablet, Take 100 mg by mouth daily. , Disp: , Rfl:  .  Multiple Vitamin (MULTIVITAMIN) tablet, Take 1 tablet by mouth daily., Disp: , Rfl:  .  ondansetron (ZOFRAN) 4 MG tablet, Take 1 tablet (4 mg total)  by mouth every 8 (eight) hours as needed for nausea or vomiting., Disp: 20 tablet, Rfl: 0 .  pantoprazole (PROTONIX) 40 MG tablet, Take 1 tablet (40 mg total) by mouth 2 (two) times daily., Disp: 60 tablet, Rfl: 11 .  potassium chloride (KLOR-CON 10) 10 MEQ tablet, Take 1 tablet (10 mEq total) by mouth 2 (two) times daily., Disp: 60 tablet, Rfl: 0 .  sertraline (ZOLOFT) 100 MG tablet, Take 100 mg by mouth daily., Disp: , Rfl: 3 .  spironolactone (ALDACTONE) 25 MG tablet, Take 25 mg by mouth daily., Disp: , Rfl: 6  Current Facility-Administered Medications:  .  cyanocobalamin ((VITAMIN B-12)) injection 1,000 mcg, 1,000 mcg, Intramuscular, Q30 days, Kathrine Haddock, NP, 1,000 mcg at 08/26/16 8628  Past  Medical History: Past Medical History:  Diagnosis Date  . Allergy   . Anemia   . Anxiety   . Depression   . Diabetes ([place])   . GERD (gastroesophageal reflux disease)   . Hyperlipidemia   . Hypertension   . Insomnia   . Migraines   . Osteoporosis   . [facility] spotted fever   . Transient cerebral ischemia   . Vertigo    every 2-3 months    Tobacco Use: History  Smoking Status  . Never Smoker  Smokeless Tobacco  . Never Used    Labs: Recent Review Flowsheet Data    Labs for ITP Cardiac and Pulmonary Rehab Latest Ref Rng & Units 04/18/2012 07/22/2015 03/07/2016 06/07/2016 11/29/2016   Cholestrol 100 - 199 mg/dL 185 139 145 213(H) 95(L)   LDLCALC 0 - 99 mg/dL 118(H) 63 77 143(H) 45   HDL >39 mg/dL 42 45 43 37(L) 29(L)   Trlycerides 0 - 149 mg/dL 126 156(H) 123 164(H) 105   Hemoglobin A1c - 6.5(H) - 7.8% - -       Pulmonary Assessment Scores:     Pulmonary Assessment Scores    Row Name 02/27/17 1515         ADL UCSD   ADL Phase Entry     SOB Score total 48     Rest 0     Walk 2     Stairs 1     Bath 3     Dress 0     Shop 3       CAT Score   CAT Score 14       mMRC Score   mMRC Score 4        Pulmonary Function Assessment:     Pulmonary Function Assessment - 02/27/17 1525      Breath   Bilateral Breath Sounds Clear   Shortness of Breath No  she feels tired not short of breath       Exercise Target Goals: Date: 02/27/17  Exercise Program Goal: Individual exercise prescription set with THRR, safety & activity barriers. Participant demonstrates ability to understand and report RPE using BORG scale, to self-measure pulse accurately, and to acknowledge the importance of the exercise prescription.  Exercise Prescription Goal: Starting with aerobic activity 30 plus minutes a day, 3 days per week for initial exercise prescription. Provide home exercise prescription and guidelines that participant acknowledges understanding prior to  discharge.  Activity Barriers & Risk Stratification:     Activity Barriers & Cardiac Risk Stratification - 02/27/17 1640      Activity Barriers & Cardiac Risk Stratification   Activity Barriers Deconditioning;Muscular Weakness;Shortness of Breath;Assistive Device      6 Minute Walk:  6 Minute Walk    Row Name 02/27/17 1637         6 Minute Walk   Phase Initial     Distance 610 feet     Walk Time 6 minutes     # of Rest Breaks 0     MPH 1.15     METS 2.08     RPE 9     Perceived Dyspnea  0     VO2 Peak 7.28     Symptoms No  walking with cane and wearing slides     Resting HR 72 bpm     Resting BP 128/66     Resting Oxygen Saturation  99 %     Exercise Oxygen Saturation  during 6 min walk 90 %     Max Ex. HR 98 bpm     Max Ex. BP 152/80     2 Minute Post BP 134/74       Interval HR   1 Minute HR 85     2 Minute HR 86     3 Minute HR 87     4 Minute HR 96     5 Minute HR 92     6 Minute HR 98     2 Minute Post HR 76     Interval Heart Rate? Yes       Interval Oxygen   Interval Oxygen? Yes     Baseline Oxygen Saturation % 99 %     1 Minute Oxygen Saturation % 96 %     1 Minute Liters of Oxygen 0 L  room air     2 Minute Oxygen Saturation % 93 %     2 Minute Liters of Oxygen 0 L     3 Minute Oxygen Saturation % 90 %     3 Minute Liters of Oxygen 0 L     4 Minute Oxygen Saturation % 97 %     4 Minute Liters of Oxygen 0 L     5 Minute Oxygen Saturation % 99 %     5 Minute Liters of Oxygen 0 L     6 Minute Oxygen Saturation % 91 %     6 Minute Liters of Oxygen 0 L     2 Minute Post Oxygen Saturation % 95 %     2 Minute Post Liters of Oxygen 0 L       Oxygen Initial Assessment:     Oxygen Initial Assessment - 02/27/17 1520      Home Oxygen   Home Oxygen Device None   Sleep Oxygen Prescription CPAP   Liters per minute 0   Home Exercise Oxygen Prescription None   Home at Rest Exercise Oxygen Prescription None   Compliance with Home Oxygen Use  Yes     Initial 6 min Walk   Oxygen Used None     Program Oxygen Prescription   Program Oxygen Prescription None     Intervention   Short Term Goals To learn and understand importance of monitoring SPO2 with pulse oximeter and demonstrate accurate use of the pulse oximeter.;To learn and demonstrate proper pursed lip breathing techniques or other breathing techniques.;To learn and understand importance of maintaining oxygen saturations>88%;To learn and demonstrate proper use of respiratory medications   Long  Term Goals Maintenance of O2 saturations>88%;Compliance with respiratory medication;Demonstrates proper use of MDI's;Exhibits proper breathing techniques, such as pursed lip breathing or other method taught during program session;Verbalizes importance of monitoring  SPO2 with pulse oximeter and return demonstration      Oxygen Re-Evaluation:   Oxygen Discharge (Final Oxygen Re-Evaluation):   Initial Exercise Prescription:     Initial Exercise Prescription - 02/27/17 1600      Date of Initial Exercise RX and Referring Provider   Date 02/27/17   Referring Provider Trellis Moment MD     Treadmill   MPH 1   Grade 0.5   Minutes 15   METs 1.83     NuStep   Level 1   SPM 80   Minutes 15   METs 2     REL-XR   Level 1   Speed 50   Minutes 15   METs 2     Prescription Details   Frequency (times per week) 3   Duration Progress to 45 minutes of aerobic exercise without signs/symptoms of physical distress     Intensity   THRR 40-80% of Max Heartrate 103-134   Ratings of Perceived Exertion 11-13   Perceived Dyspnea 0-4     Progression   Progression Continue to progress workloads to maintain intensity without signs/symptoms of physical distress.     Resistance Training   Training Prescription Yes   Weight 3 lbs      Perform Capillary Blood Glucose checks as needed.  Exercise Prescription Changes:     Exercise Prescription Changes    Row Name 02/27/17 1600              Response to Exercise   Blood Pressure (Admit) 128/66       Blood Pressure (Exercise) 152/80       Blood Pressure (Exit) 134/74       Heart Rate (Admit) 72 bpm       Heart Rate (Exercise) 98 bpm       Heart Rate (Exit) 76 bpm       Oxygen Saturation (Admit) 99 %       Oxygen Saturation (Exercise) 90 %       Oxygen Saturation (Exit) 95 %       Rating of Perceived Exertion (Exercise) 9       Perceived Dyspnea (Exercise) 0       Symptoms none       Comments walk test results, wearing slides, using cane          Exercise Comments:   Exercise Goals and Review:     Exercise Goals    Row Name 02/27/17 1643             Exercise Goals   Increase Physical Activity Yes       Intervention Provide advice, education, support and counseling about physical activity/exercise needs.;Develop an individualized exercise prescription for aerobic and resistive training based on initial evaluation findings, risk stratification, comorbidities and participant's personal goals.       Expected Outcomes Achievement of increased cardiorespiratory fitness and enhanced flexibility, muscular endurance and strength shown through measurements of functional capacity and personal statement of participant.       Increase Strength and Stamina Yes       Intervention Provide advice, education, support and counseling about physical activity/exercise needs.;Develop an individualized exercise prescription for aerobic and resistive training based on initial evaluation findings, risk stratification, comorbidities and participant's personal goals.       Expected Outcomes Achievement of increased cardiorespiratory fitness and enhanced flexibility, muscular endurance and strength shown through measurements of functional capacity and personal statement of participant.       Able  to understand and use rate of perceived exertion (RPE) scale Yes       Intervention Provide education and explanation on how to use RPE  scale       Expected Outcomes Short Term: Able to use RPE daily in rehab to express subjective intensity level;Long Term:  Able to use RPE to guide intensity level when exercising independently       Able to understand and use Dyspnea scale Yes       Intervention Provide education and explanation on how to use Dyspnea scale       Expected Outcomes Short Term: Able to use Dyspnea scale daily in rehab to express subjective sense of shortness of breath during exertion;Long Term: Able to use Dyspnea scale to guide intensity level when exercising independently       Knowledge and understanding of Target Heart Rate Range (THRR) Yes       Intervention Provide education and explanation of THRR including how the numbers were predicted and where they are located for reference       Expected Outcomes Short Term: Able to state/look up THRR;Long Term: Able to use THRR to govern intensity when exercising independently;Short Term: Able to use daily as guideline for intensity in rehab       Able to check pulse independently Yes       Intervention Provide education and demonstration on how to check pulse in carotid and radial arteries.;Review the importance of being able to check your own pulse for safety during independent exercise       Expected Outcomes Short Term: Able to explain why pulse checking is important during independent exercise;Long Term: Able to check pulse independently and accurately       Understanding of Exercise Prescription Yes       Intervention Provide education, explanation, and written materials on patient's individual exercise prescription       Expected Outcomes Short Term: Able to explain program exercise prescription;Long Term: Able to explain home exercise prescription to exercise independently          Exercise Goals Re-Evaluation :   Discharge Exercise Prescription (Final Exercise Prescription Changes):     Exercise Prescription Changes - 02/27/17 1600      Response to  Exercise   Blood Pressure (Admit) 128/66   Blood Pressure (Exercise) 152/80   Blood Pressure (Exit) 134/74   Heart Rate (Admit) 72 bpm   Heart Rate (Exercise) 98 bpm   Heart Rate (Exit) 76 bpm   Oxygen Saturation (Admit) 99 %   Oxygen Saturation (Exercise) 90 %   Oxygen Saturation (Exit) 95 %   Rating of Perceived Exertion (Exercise) 9   Perceived Dyspnea (Exercise) 0   Symptoms none   Comments walk test results, wearing slides, using cane      Nutrition:  Target Goals: Understanding of nutrition guidelines, daily intake of sodium <1529m, cholesterol <2034m calories 30% from fat and 7% or less from saturated fats, daily to have 5 or more servings of fruits and vegetables.  Biometrics:     Pre Biometrics - 02/27/17 1643      Pre Biometrics   Height 5' 7.5" (1.715 m)   Weight 151 lb 8 oz (68.7 kg)   Waist Circumference 33 inches   Hip Circumference 36.75 inches   Waist to Hip Ratio 0.9 %   BMI (Calculated) 23.36       Nutrition Therapy Plan and Nutrition Goals:     Nutrition Therapy & Goals - 02/27/17 1514  Intervention Plan   Intervention Prescribe, educate and counsel regarding individualized specific dietary modifications aiming towards targeted core components such as weight, hypertension, lipid management, diabetes, heart failure and other comorbidities.;Nutrition handout(s) given to patient.   Expected Outcomes Short Term Goal: Understand basic principles of dietary content, such as calories, fat, sodium, cholesterol and nutrients.;Short Term Goal: A plan has been developed with personal nutrition goals set during dietitian appointment.;Long Term Goal: Adherence to prescribed nutrition plan.      Nutrition Discharge: Rate Your Plate Scores:     Nutrition Assessments - 02/27/17 1614      MEDFICTS Scores   Pre Score 65      Nutrition Goals Re-Evaluation:   Nutrition Goals Discharge (Final Nutrition Goals Re-Evaluation):   Psychosocial: Target  Goals: Acknowledge presence or absence of significant depression and/or stress, maximize coping skills, provide positive support system. Participant is able to verbalize types and ability to use techniques and skills needed for reducing stress and depression.   Initial Review & Psychosocial Screening:     Initial Psych Review & Screening - 02/27/17 1511      Initial Review   Current issues with Current Depression;Current Stress Concerns;Current Anxiety/Panic   Source of Stress Concerns Chronic Illness;Family   Comments Relationship not well with her husband     [place]? Yes   Comments Daughters show her support     Barriers   Psychosocial barriers to participate in program The patient should benefit from training in stress management and relaxation.     Screening Interventions   Interventions Yes;To provide support and resources with identified psychosocial needs;Provide feedback about the scores to participant;Encouraged to exercise;Program counselor consult   Expected Outcomes Short Term goal: Utilizing psychosocial counselor, staff and physician to assist with identification of specific Stressors or current issues interfering with healing process. Setting desired goal for each stressor or current issue identified.;Long Term Goal: Stressors or current issues are controlled or eliminated.;Short Term goal: Identification and review with participant of any Quality of Life or Depression concerns found by scoring the questionnaire.;Long Term goal: The participant improves quality of Life and PHQ9 Scores as seen by post scores and/or verbalization of changes      Quality of Life Scores:   PHQ-9: Recent Review Flowsheet Data    Depression screen [facility] 2/9 02/27/2017 11/29/2016 11/29/2016 07/22/2015 04/28/2015   Decreased Interest _0 0 1   Down, Depressed, Hopeless _1 0 0   PHQ - 2 Score _2 0 1   Altered sleeping 0 3 3 - -   Tired, decreased energy _3 - -   Change in appetite _4 - -   Feeling bad or failure about yourself  _5 - -   Trouble concentrating _6 - -   Moving slowly or fidgety/restless 0 0 0 - -   Suicidal thoughts _7 - -   PHQ-9 Score _8 - -   Difficult doing work/chores Somewhat difficult - - - -     Interpretation of Total Score  Total Score Depression Severity:  1-4 = Minimal depression, 5-9 = Mild depression, 10-14 = Moderate depression, 15-19 = Moderately severe depression, 20-27 = Severe depression   Psychosocial Evaluation and Intervention:   Psychosocial Re-Evaluation:   Psychosocial Discharge (Final Psychosocial Re-Evaluation):   Education: Education Goals: Education classes will be provided on a weekly basis, covering required topics.  Participant will state understanding/return demonstration of topics presented.  Learning Barriers/Preferences:     Learning Barriers/Preferences - 02/27/17 1643      Learning Barriers/Preferences   Learning Barriers None   Learning Preferences None      Education Topics: Initial Evaluation Education: - Verbal, written and demonstration of respiratory meds, RPE/PD scales, oximetry and breathing techniques. Instruction on use of nebulizers and MDIs: cleaning and proper use, rinsing mouth with steroid doses and importance of monitoring MDI activations.   Pulmonary Rehab from 02/27/2017 in [facility] Cardiac and Pulmonary Rehab  Date  02/27/17  Educator  [place]  Instruction Review Code  1- Verbalizes Understanding      General Nutrition Guidelines/Fats and Fiber: -Group instruction provided by verbal, written material, models and posters to present the general guidelines for heart healthy nutrition. Gives an explanation and review of dietary fats and fiber.   Pulmonary Rehab from 02/27/2017 in [facility] Cardiac and Pulmonary Rehab  Date  02/27/17  Educator  [facility]  Instruction Review Code  1- Verbalizes Understanding      Controlling Sodium/Reading Food  Labels: -Group verbal and written material supporting the discussion of sodium use in heart healthy nutrition. Review and explanation with models, verbal and written materials for utilization of the food label.   Exercise Physiology & Risk Factors: - Group verbal and written instruction with models to review the exercise physiology of the cardiovascular system and associated critical values. Details cardiovascular disease risk factors and the goals associated with each risk factor.   Aerobic Exercise & Resistance Training: - Gives group verbal and written discussion on the health impact of inactivity. On the components of aerobic and resistive training programs and the benefits of this training and how to safely progress through these programs.   Flexibility, Balance, General Exercise Guidelines: - Provides group verbal and written instruction on the benefits of flexibility and balance training programs. Provides general exercise guidelines with specific guidelines to those with heart or lung disease. Demonstration and skill practice provided.   Stress Management: - Provides group verbal and written instruction about the health risks of elevated stress, cause of high stress, and healthy ways to reduce stress.   Depression: - Provides group verbal and written instruction on the correlation between heart/lung disease and depressed mood, treatment options, and the stigmas associated with seeking treatment.   Exercise & Equipment Safety: - Individual verbal instruction and demonstration of equipment use and safety with use of the equipment.   Infection Prevention: - Provides verbal and written material to individual with discussion of infection control including proper hand washing and proper equipment cleaning during exercise session.   Pulmonary Rehab from 02/27/2017 in [facility] Cardiac and Pulmonary Rehab  Date  02/27/17  Educator  [facility]  Instruction Review Code  1- Verbalizes Understanding       Falls Prevention: - Provides verbal and written material to individual with discussion of falls prevention and safety.   Pulmonary Rehab from 02/27/2017 in [facility] Cardiac and Pulmonary Rehab  Date  02/27/17  Educator  [facility]  Instruction Review Code  1- Verbalizes Understanding      Diabetes: - Individual verbal and written instruction to review signs/symptoms of diabetes, desired ranges of glucose level fasting, after meals and with exercise. Advice that pre and post exercise glucose checks will be done for 3 sessions at entry of program.   Chronic Lung Diseases: - Group verbal and written instruction to review new updates, new respiratory medications, new advancements in procedures and treatments. Provide  informative websites and "800" numbers of self-education.   Lung Procedures: - Group verbal and written instruction to describe testing methods done to diagnose lung disease. Review the outcome of test results. Describe the treatment choices: Pulmonary Function Tests, ABGs and oximetry.   Energy Conservation: - Provide group verbal and written instruction for methods to conserve energy, plan and organize activities. Instruct on pacing techniques, use of adaptive equipment and posture/positioning to relieve shortness of breath.   Triggers: - Group verbal and written instruction to review types of environmental controls: home humidity, furnaces, filters, dust mite/pet prevention, HEPA vacuums. To discuss weather changes, air quality and the benefits of nasal washing.   Exacerbations: - Group verbal and written instruction to provide: warning signs, infection symptoms, calling MD promptly, preventive modes, and value of vaccinations. Review: effective airway clearance, coughing and/or vibration techniques. Create an Sports administrator.   Oxygen: - Individual and group verbal and written instruction on oxygen therapy. Includes supplement oxygen, available portable oxygen systems,  continuous and intermittent flow rates, oxygen safety, concentrators, and Medicare reimbursement for oxygen.   Respiratory Medications: - Group verbal and written instruction to review medications for lung disease. Drug class, frequency, complications, importance of spacers, rinsing mouth after steroid MDI's, and proper cleaning methods for nebulizers.   AED/CPR: - Group verbal and written instruction with the use of models to demonstrate the basic use of the AED with the basic ABC's of resuscitation.   Breathing Retraining: - Provides individuals verbal and written instruction on purpose, frequency, and proper technique of diaphragmatic breathing and pursed-lipped breathing. Applies individual practice skills.   Pulmonary Rehab from 02/27/2017 in [facility] Cardiac and Pulmonary Rehab  Date  02/27/17  Educator  [facility]  Instruction Review Code  1- Verbalizes Understanding      Anatomy and Physiology of the Lungs: - Group verbal and written instruction with the use of models to provide basic lung anatomy and physiology related to function, structure and complications of lung disease.   Anatomy & Physiology of the Heart: - Group verbal and written instruction and models provide basic cardiac anatomy and physiology, with the coronary electrical and arterial systems. Review of: AMI, Angina, Valve disease, Heart Failure, Cardiac Arrhythmia, Pacemakers, and the ICD.   Heart Failure: - Group verbal and written instruction on the basics of heart failure: signs/symptoms, treatments, explanation of ejection fraction, enlarged heart and cardiomyopathy.   Sleep Apnea: - Individual verbal and written instruction to review Obstructive Sleep Apnea. Review of risk factors, methods for diagnosing and types of masks and machines for OSA.   Pulmonary Rehab from 02/27/2017 in [facility] Cardiac and Pulmonary Rehab  Date  02/27/17  Educator  [facility]  Instruction Review Code  1- Verbalizes Understanding      Anxiety: -  Provides group, verbal and written instruction on the correlation between heart/lung disease and anxiety, treatment options, and management of anxiety.   Relaxation: - Provides group, verbal and written instruction about the benefits of relaxation for patients with heart/lung disease. Also provides patients with examples of relaxation techniques.   Cardiac Medications: - Group verbal and written instruction to review commonly prescribed medications for heart disease. Reviews the medication, class of the drug, and side effects.   Know Your Numbers: -Group verbal and written instruction about important numbers in your health.  Review of Cholesterol, Blood Pressure, Diabetes, and BMI and the role they play in your overall health.   Other: -Provides group and verbal instruction on various topics (see comments)  Knowledge Questionnaire Score:     Knowledge Questionnaire Score - 02/27/17 1519      Knowledge Questionnaire Score   Pre Score 8/10       Core Components/Risk Factors/Patient Goals at Admission:     Personal Goals and Risk Factors at Admission - 02/27/17 1526      Core Components/Risk Factors/Patient Goals on Admission    Weight Management Weight Maintenance;Yes   Intervention Weight Management: Develop a combined nutrition and exercise program designed to reach desired caloric intake, while maintaining appropriate intake of nutrient and fiber, sodium and fats, and appropriate energy expenditure required for the weight goal.;Weight Management: Provide education and appropriate resources to help participant work on and attain dietary goals.;Weight Management/Obesity: Establish reasonable short term and long term weight goals.   Admit Weight 151 lb 8 oz (68.7 kg)   Goal Weight: Short Term 146 lb (66.2 kg)   Goal Weight: Long Term 150 lb (68 kg)   Expected Outcomes Short Term: Continue to assess and modify interventions until short term weight is achieved;Long Term:  Adherence to nutrition and physical activity/exercise program aimed toward attainment of established weight goal;Weight Maintenance: Understanding of the daily nutrition guidelines, which includes 25-35% calories from fat, 7% or less cal from saturated fats, less than 29m cholesterol, less than 1.5gm of sodium, & 5 or more servings of fruits and vegetables daily;Understanding recommendations for meals to include 15-35% energy as protein, 25-35% energy from fat, 35-60% energy from carbohydrates, less than 2058mof dietary cholesterol, 20-35 gm of total fiber daily;Understanding of distribution of calorie intake throughout the day with the consumption of 4-5 meals/snacks   Diabetes Yes   Intervention Provide education about signs/symptoms and action to take for hypo/hyperglycemia.;Provide education about proper nutrition, including hydration, and aerobic/resistive exercise prescription along with prescribed medications to achieve blood glucose in normal ranges: Fasting glucose 65-99 mg/dL   Expected Outcomes Short Term: Participant verbalizes understanding of the signs/symptoms and immediate care of hyper/hypoglycemia, proper foot care and importance of medication, aerobic/resistive exercise and nutrition plan for blood glucose control.;Long Term: Attainment of HbA1C < 7%.   Hypertension Yes   Intervention Provide education on lifestyle modifcations including regular physical activity/exercise, weight management, moderate sodium restriction and increased consumption of fresh fruit, vegetables, and low fat dairy, alcohol moderation, and smoking cessation.;Monitor prescription use compliance.   Expected Outcomes Short Term: Continued assessment and intervention until BP is < 140/9058mG in hypertensive participants. < 130/73m75m in hypertensive participants with diabetes, heart failure or chronic kidney disease.;Long Term: Maintenance of blood pressure at goal levels.   Stress Yes   Intervention Offer  individual and/or small group education and counseling on adjustment to heart disease, stress management and health-related lifestyle change. Teach and support self-help strategies.;Refer participants experiencing significant psychosocial distress to appropriate mental health specialists for further evaluation and treatment. When possible, include family members and significant others in education/counseling sessions.   Expected Outcomes Short Term: Participant demonstrates changes in health-related behavior, relaxation and other stress management skills, ability to obtain effective social support, and compliance with psychotropic medications if prescribed.;Long Term: Emotional wellbeing is indicated by absence of clinically significant psychosocial distress or social isolation.      Core Components/Risk Factors/Patient Goals Review:    Core Components/Risk Factors/Patient Goals at Discharge (Final Review):    ITP Comments:     ITP Comments    Row Name 02/27/17 1626           ITP Comments Medical evaluation completed. Chart  sent to Dr. Emily Filbert director of [place] for signature and review.          Comments: Initial ITP

## 2017-02-27 NOTE — Patient Instructions (Signed)
Patient Instructions  Patient Details  Name: Jamie Murray MRN: [id number] Date of Birth: [birth year] Referring Provider:  Flora Lipps, MD  Below are the personal goals you chose as well as exercise and nutrition goals. Our goal is to help you keep on track towards obtaining and maintaining your goals. We will be discussing your progress on these goals with you throughout the program.  Initial Exercise Prescription:     Initial Exercise Prescription - 02/27/17 1600      Date of Initial Exercise RX and Referring Provider   Date 02/27/17   Referring Provider Trellis Moment MD     Treadmill   MPH 1   Grade 0.5   Minutes 15   METs 1.83     NuStep   Level 1   SPM 80   Minutes 15   METs 2     REL-XR   Level 1   Speed 50   Minutes 15   METs 2     Prescription Details   Frequency (times per week) 3   Duration Progress to 45 minutes of aerobic exercise without signs/symptoms of physical distress     Intensity   THRR 40-80% of Max Heartrate 103-134   Ratings of Perceived Exertion 11-13   Perceived Dyspnea 0-4     Progression   Progression Continue to progress workloads to maintain intensity without signs/symptoms of physical distress.     Resistance Training   Training Prescription Yes   Weight 3 lbs      Exercise Goals: Frequency: Be able to perform aerobic exercise three times per week working toward 3-5 days per week.  Intensity: Work with a perceived exertion of 11 (fairly light) - 15 (hard) as tolerated. Follow your new exercise prescription and watch for changes in prescription as you progress with the program. Changes will be reviewed with you when they are made.  Duration: You should be able to do 30 minutes of continuous aerobic exercise in addition to a 5 minute warm-up and a 5 minute cool-down routine.  Nutrition Goals: Your personal nutrition goals will be established when you do your nutrition analysis with the dietician.  The following are  nutrition guidelines to follow: Cholesterol < 278m/day Sodium < 15024mday Fiber: Women over 50 yrs - 21 grams per day  Personal Goals:     Personal Goals and Risk Factors at Admission - 02/27/17 1526      Core Components/Risk Factors/Patient Goals on Admission    Weight Management Weight Maintenance;Yes   Intervention Weight Management: Develop a combined nutrition and exercise program designed to reach desired caloric intake, while maintaining appropriate intake of nutrient and fiber, sodium and fats, and appropriate energy expenditure required for the weight goal.;Weight Management: Provide education and appropriate resources to help participant work on and attain dietary goals.;Weight Management/Obesity: Establish reasonable short term and long term weight goals.   Admit Weight 151 lb 8 oz (68.7 kg)   Goal Weight: Short Term 146 lb (66.2 kg)   Goal Weight: Long Term 150 lb (68 kg)   Expected Outcomes Short Term: Continue to assess and modify interventions until short term weight is achieved;Long Term: Adherence to nutrition and physical activity/exercise program aimed toward attainment of established weight goal;Weight Maintenance: Understanding of the daily nutrition guidelines, which includes 25-35% calories from fat, 7% or less cal from saturated fats, less than 20079mholesterol, less than 1.5gm of sodium, & 5 or more servings of fruits and vegetables daily;Understanding recommendations for meals to include 15-35%  energy as protein, 25-35% energy from fat, 35-60% energy from carbohydrates, less than 237m of dietary cholesterol, 20-35 gm of total fiber daily;Understanding of distribution of calorie intake throughout the day with the consumption of 4-5 meals/snacks   Diabetes Yes   Intervention Provide education about signs/symptoms and action to take for hypo/hyperglycemia.;Provide education about proper nutrition, including hydration, and aerobic/resistive exercise prescription along with  prescribed medications to achieve blood glucose in normal ranges: Fasting glucose 65-99 mg/dL   Expected Outcomes Short Term: Participant verbalizes understanding of the signs/symptoms and immediate care of hyper/hypoglycemia, proper foot care and importance of medication, aerobic/resistive exercise and nutrition plan for blood glucose control.;Long Term: Attainment of HbA1C < 7%.   Hypertension Yes   Intervention Provide education on lifestyle modifcations including regular physical activity/exercise, weight management, moderate sodium restriction and increased consumption of fresh fruit, vegetables, and low fat dairy, alcohol moderation, and smoking cessation.;Monitor prescription use compliance.   Expected Outcomes Short Term: Continued assessment and intervention until BP is < 140/986mHG in hypertensive participants. < 130/8064mG in hypertensive participants with diabetes, heart failure or chronic kidney disease.;Long Term: Maintenance of blood pressure at goal levels.   Stress Yes   Intervention Offer individual and/or small group education and counseling on adjustment to heart disease, stress management and health-related lifestyle change. Teach and support self-help strategies.;Refer participants experiencing significant psychosocial distress to appropriate mental health specialists for further evaluation and treatment. When possible, include family members and significant others in education/counseling sessions.   Expected Outcomes Short Term: Participant demonstrates changes in health-related behavior, relaxation and other stress management skills, ability to obtain effective social support, and compliance with psychotropic medications if prescribed.;Long Term: Emotional wellbeing is indicated by absence of clinically significant psychosocial distress or social isolation.      Tobacco Use Initial Evaluation: History  Smoking Status  . Never Smoker  Smokeless Tobacco  . Never Used     Exercise Goals and Review:     Exercise Goals    Row Name 02/27/17 1643             Exercise Goals   Increase Physical Activity Yes       Intervention Provide advice, education, support and counseling about physical activity/exercise needs.;Develop an individualized exercise prescription for aerobic and resistive training based on initial evaluation findings, risk stratification, comorbidities and participant's personal goals.       Expected Outcomes Achievement of increased cardiorespiratory fitness and enhanced flexibility, muscular endurance and strength shown through measurements of functional capacity and personal statement of participant.       Increase Strength and Stamina Yes       Intervention Provide advice, education, support and counseling about physical activity/exercise needs.;Develop an individualized exercise prescription for aerobic and resistive training based on initial evaluation findings, risk stratification, comorbidities and participant's personal goals.       Expected Outcomes Achievement of increased cardiorespiratory fitness and enhanced flexibility, muscular endurance and strength shown through measurements of functional capacity and personal statement of participant.       Able to understand and use rate of perceived exertion (RPE) scale Yes       Intervention Provide education and explanation on how to use RPE scale       Expected Outcomes Short Term: Able to use RPE daily in rehab to express subjective intensity level;Long Term:  Able to use RPE to guide intensity level when exercising independently       Able to understand and use Dyspnea scale  Yes       Intervention Provide education and explanation on how to use Dyspnea scale       Expected Outcomes Short Term: Able to use Dyspnea scale daily in rehab to express subjective sense of shortness of breath during exertion;Long Term: Able to use Dyspnea scale to guide intensity level when exercising  independently       Knowledge and understanding of Target Heart Rate Range (THRR) Yes       Intervention Provide education and explanation of THRR including how the numbers were predicted and where they are located for reference       Expected Outcomes Short Term: Able to state/look up THRR;Long Term: Able to use THRR to govern intensity when exercising independently;Short Term: Able to use daily as guideline for intensity in rehab       Able to check pulse independently Yes       Intervention Provide education and demonstration on how to check pulse in carotid and radial arteries.;Review the importance of being able to check your own pulse for safety during independent exercise       Expected Outcomes Short Term: Able to explain why pulse checking is important during independent exercise;Long Term: Able to check pulse independently and accurately       Understanding of Exercise Prescription Yes       Intervention Provide education, explanation, and written materials on patient's individual exercise prescription       Expected Outcomes Short Term: Able to explain program exercise prescription;Long Term: Able to explain home exercise prescription to exercise independently          Copy of goals given to participant.

## 2017-03-01 ENCOUNTER — Ambulatory Visit: Payer: Medicare Other | Admitting: Unknown Physician Specialty

## 2017-03-06 ENCOUNTER — Encounter: Payer: Medicare Other | Attending: Internal Medicine

## 2017-03-06 DIAGNOSIS — I272 Pulmonary hypertension, unspecified: Secondary | ICD-10-CM | POA: Diagnosis present

## 2017-03-06 LAB — GLUCOSE, CAPILLARY
Glucose-Capillary: 139 mg/dL — ABNORMAL HIGH (ref 65–99)
Glucose-Capillary: 149 mg/dL — ABNORMAL HIGH (ref 65–99)

## 2017-03-06 NOTE — Progress Notes (Signed)
Daily Session Note  Patient Details  Name: Jamie Murray MRN: [id number] Date of Birth: [birth year] Referring Provider:     Pulmonary Rehab from 02/27/2017 in [facility] Cardiac and Pulmonary Rehab  Referring Provider  Trellis Moment MD      Encounter Date: 03/06/2017  Check In:     Session Check In - 03/06/17 1156      Check-In   Location [facility]-Cardiac & Pulmonary Rehab   Staff Present Nada Maclachlan, BA, ACSM CEP, Exercise Physiologist;Kelly Amedeo Plenty, BS, ACSM CEP, Exercise Physiologist;Hodaya Curto Flavia Shipper   Supervising physician immediately available to respond to emergencies [facility] immediately available ER MD   Physician(s) Dr. Alfred Levins and Mariea Clonts   Medication changes reported     No   Fall or balance concerns reported    No   Warm-up and Cool-down Performed as group-led instruction   Resistance Training Performed Yes   VAD Patient? No     Pain Assessment   Currently in Pain? No/denies   Multiple Pain Sites No         History  Smoking Status  . Never Smoker  Smokeless Tobacco  . Never Used    Goals Met:  Exercise tolerated well Queuing for purse lip breathing No report of cardiac concerns or symptoms Strength training completed today  Goals Unmet:  Not Applicable  Comments: First full day of exercise!  Patient was oriented to gym and equipment including functions, settings, policies, and procedures.  Patient's individual exercise prescription and treatment plan were reviewed.  All starting workloads were established based on the results of the 6 minute walk test done at initial orientation visit.  The plan for exercise progression was also introduced and progression will be customized based on patient's performance and goals. Spent 15 minutes talking with Reshma about PLB.   Dr. Emily Filbert is Medical Director for [place] and [facility].

## 2017-03-08 ENCOUNTER — Encounter: Payer: Medicare Other | Admitting: *Deleted

## 2017-03-08 DIAGNOSIS — I272 Pulmonary hypertension, unspecified: Secondary | ICD-10-CM | POA: Diagnosis not present

## 2017-03-08 LAB — GLUCOSE, CAPILLARY
GLUCOSE-CAPILLARY: 72 mg/dL (ref 65–99)
GLUCOSE-CAPILLARY: 61 mg/dL — AB (ref 65–99)
GLUCOSE-CAPILLARY: 93 mg/dL (ref 65–99)
Glucose-Capillary: 84 mg/dL (ref 65–99)

## 2017-03-08 NOTE — Progress Notes (Deleted)
Daily Session Note  Patient Details  Name: Jamie Murray MRN: [id number] Date of Birth: 1946-01-28 Referring Provider:     Pulmonary Rehab from 02/27/2017 in [facility] Cardiac and Pulmonary Rehab  Referring Provider  Trellis Moment MD      Encounter Date: 03/08/2017  Check In:     Session Check In - 03/08/17 1148      Check-In   Location [facility]-Cardiac & Pulmonary Rehab   Staff Present Renita Papa, RN BSN;Joseph Darrin Nipper, [place], ACSM RCEP, Exercise Physiologist   Supervising physician immediately available to respond to emergencies [facility] immediately available ER MD   Physician(s) Dr. Clearnce Hasten and Jimmye Norman   Medication changes reported     No   Fall or balance concerns reported    No   Warm-up and Cool-down Performed as group-led instruction   Resistance Training Performed Yes   VAD Patient? No     Pain Assessment   Currently in Pain? No/denies         History  Smoking Status  . Never Smoker  Smokeless Tobacco  . Never Used    Goals Met:  Proper associated with RPD/PD & O2 Sat Independence with exercise equipment Using PLB without cueing & demonstrates good technique Exercise tolerated well Strength training completed today  Goals Unmet:  Not Applicable  Comments: Pt able to follow exercise prescription today without complaint.  Will continue to monitor for progression.   Dr. Emily Filbert is Medical Director for [place] and [facility].

## 2017-03-08 NOTE — Progress Notes (Signed)
Incomplete Session Note  Patient Details  Name: Jamie Murray MRN: [id number] Date of Birth: 07/11/1945 Referring Provider:     Pulmonary Rehab from 02/27/2017 in [facility] Cardiac and Pulmonary Rehab  Referring Provider  Trellis Moment MD      Litzy Melbourne Abts did not complete her rehab session. Her pre exercise CBG was 72, crackers given. CBG went down to 61, gel given. Her CBG went up to 82. Patient given education on importance of protein and checking her sugar regularly. She was instructed to eat lunch right when she gets home.

## 2017-03-09 ENCOUNTER — Encounter: Payer: Self-pay | Admitting: Podiatry

## 2017-03-09 ENCOUNTER — Ambulatory Visit (INDEPENDENT_AMBULATORY_CARE_PROVIDER_SITE_OTHER): Payer: Medicare Other | Admitting: Podiatry

## 2017-03-09 ENCOUNTER — Ambulatory Visit (INDEPENDENT_AMBULATORY_CARE_PROVIDER_SITE_OTHER): Payer: PRIVATE HEALTH INSURANCE | Admitting: Podiatry

## 2017-03-09 DIAGNOSIS — L603 Nail dystrophy: Secondary | ICD-10-CM | POA: Diagnosis not present

## 2017-03-09 DIAGNOSIS — E119 Type 2 diabetes mellitus without complications: Secondary | ICD-10-CM

## 2017-03-09 NOTE — Progress Notes (Addendum)
This patient presents the office with chief complaint of a big toenail that has become loose from the cuticle.  This patient states that it has recently developed, but is not causing any pain or discomfort.  She denies any drainage or pus coming from the site of the nail  attachment.  Patient states that she is diabetic and she is concerned about her nail.   General Appearance  Alert, conversant and in no acute stress.  Vascular  Dorsalis pedis and posterior pulses are palpable  bilaterally.  Capillary return is within normal limits  Bilaterally. Temperature is within normal limits  Bilaterally  Neurologic  Senn-Weinstein monofilament wire test within normal limits  bilaterally. Muscle power  Within normal limits bilaterally.  Nails  the nail at the proximal nail fold of the left hallux has loosened and become unattached.  The remaining nail is attached to the nailbed with no evidence of any redness, swelling or drainage No evidence of bacterial infection or drainage bilaterally.  Orthopedic  No limitations of motion of motion feet bilaterally.  No crepitus or effusions noted.  HAV  B/L.  Skin  normotropic skin with no porokeratosis noted bilaterally.  No signs of infections or ulcers noted. 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  ROV>  discussed this condition with this patient and told her we needed to allow it to declare itself. Prior to any definitive treatment of the nail.  Return to clinic when necessary   Gardiner Barefoot DPM

## 2017-03-10 NOTE — Progress Notes (Signed)
patient presents the office with chief complaint of a big toenail that has become loose from the cuticle.  This patient states that it has recently developed, but is not causing any pain or discomfort.  She denies any drainage or pus coming from the site of the nail  attachment.  Patient states that she is diabetic and she is concerned about her nail.   General Appearance  Alert, conversant and in no acute stress.  Vascular  Dorsalis pedis and posterior pulses are palpable  bilaterally.  Capillary return is within normal limits  Bilaterally. Temperature is within normal limits  Bilaterally  Neurologic  Senn-Weinstein monofilament wire test within normal limits  bilaterally. Muscle power  Within normal limits bilaterally.  Nails  the nail at the proximal nail fold of the left hallux has loosened and become unattached.  The remaining nail is attached to the nailbed with no evidence of any redness, swelling or drainage No evidence of bacterial infection or drainage bilaterally.  Orthopedic  No limitations of motion of motion feet bilaterally.  No crepitus or effusions noted.  HAV  B/L.  Skin  normotropic skin with no porokeratosis noted bilaterally.  No signs of infections or ulcers noted. 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  ROV>  discussed this condition with this patient and told her we needed to allow it to declare itself. Prior to any definitive treatment of the nail.  Return to clinic when necessary   Gardiner Barefoot DPM

## 2017-03-13 DIAGNOSIS — I272 Pulmonary hypertension, unspecified: Secondary | ICD-10-CM

## 2017-03-13 LAB — GLUCOSE, CAPILLARY
GLUCOSE-CAPILLARY: 198 mg/dL — AB (ref 65–99)
GLUCOSE-CAPILLARY: 106 mg/dL — AB (ref 65–99)

## 2017-03-13 NOTE — Progress Notes (Signed)
Daily Session Note  Patient Details  Name: Jamie Murray MRN: [id number] Date of Birth: Nov 04, 1945 Referring Provider:     Pulmonary Rehab from 02/27/2017 in [facility] Cardiac and Pulmonary Rehab  Referring Provider  Trellis Moment MD      Encounter Date: 03/13/2017  Check In:     Session Check In - 03/13/17 1158      Check-In   Staff Present Nada Maclachlan, BA, ACSM CEP, Exercise Physiologist;Kelly Amedeo Plenty, BS, ACSM CEP, Exercise Physiologist;Joseph Flavia Shipper   Supervising physician immediately available to respond to emergencies [facility] immediately available ER MD   Physician(s) Dr. Jimmye Norman and [facility]   Medication changes reported     No   Fall or balance concerns reported    No   Warm-up and Cool-down Performed as group-led instruction   Resistance Training Performed Yes   VAD Patient? No     Pain Assessment   Currently in Pain? No/denies   Multiple Pain Sites No         History  Smoking Status  . Never Smoker  Smokeless Tobacco  . Never Used    Goals Met:  Independence with exercise equipment Exercise tolerated well No report of cardiac concerns or symptoms Strength training completed today  Goals Unmet:  Not Applicable  Comments: Pt able to follow exercise prescription today without complaint.  Will continue to monitor for progression.   Dr. Emily Filbert is Medical Director for [place] and [facility].

## 2017-03-15 DIAGNOSIS — I272 Pulmonary hypertension, unspecified: Secondary | ICD-10-CM | POA: Diagnosis not present

## 2017-03-15 LAB — GLUCOSE, CAPILLARY
GLUCOSE-CAPILLARY: 153 mg/dL — AB (ref 65–99)
Glucose-Capillary: 186 mg/dL — ABNORMAL HIGH (ref 65–99)

## 2017-03-15 NOTE — Progress Notes (Signed)
Daily Session Note  Patient Details  Name: Jamie Murray MRN: [id number] Date of Birth: 05/05/1946 Referring Provider:     Pulmonary Rehab from 02/27/2017 in [facility]  Referring Provider  Kasa, Kurnin MD      Encounter Date: 03/15/2017  Check In:     Session Check In - 03/15/17 1120      Check-In   Location [facility]   Staff Present Jessica Hawkins, MA, ACSM RCEP, Exercise Physiologist;Joseph Hood RCP,RRT,BSRT;Meredith Craven, RN BSN   Supervising physician immediately available to respond to emergencies [facility] immediately available ER MD   Physician(s) Dr. Malinda and Williams   Medication changes reported     No   Fall or balance concerns reported    No   Warm-up and Cool-down Performed as group-led instruction   Resistance Training Performed Yes   VAD Patient? No     Pain Assessment   Currently in Pain? No/denies   Multiple Pain Sites No           Exercise Prescription Changes - 03/14/17 1300      Response to Exercise   Blood Pressure (Admit) 132/78   Blood Pressure (Exercise) 138/76   Blood Pressure (Exit) 128/64   Heart Rate (Admit) 78 bpm   Heart Rate (Exercise) 86 bpm   Heart Rate (Exit) 70 bpm   Oxygen Saturation (Admit) 95 %   Oxygen Saturation (Exercise) 96 %   Oxygen Saturation (Exit) 94 %   Rating of Perceived Exertion (Exercise) 11   Perceived Dyspnea (Exercise) 0   Symptoms none   Comments second day full day of exercise   Duration Progress to 45 minutes of aerobic exercise without signs/symptoms of physical distress   Intensity THRR unchanged     Progression   Progression Continue to progress workloads to maintain intensity without signs/symptoms of physical distress.   Average METs 1.87     Resistance Training   Training Prescription Yes   Weight 3 lbs   Reps 10-15     Interval Training   Interval Training No     Treadmill   MPH 0   Grade 0.5   Minutes 15   METs 1.83     NuStep   Level 1   SPM 60   Minutes 15   METs 1.7     REL-XR   Level 1   Speed 40   Minutes 15   METs 2.1      History  Smoking Status  . Never Smoker  Smokeless Tobacco  . Never Used    Goals Met:  Independence with exercise equipment Exercise tolerated well No report of cardiac concerns or symptoms Strength training completed today  Goals Unmet:  Not Applicable  Comments: Reviewed home exercise with pt today.  Pt plans to walk and use the cross trainer at [employer] for exercise.  Reviewed THR, pulse, RPE, sign and symptoms, NTG use, and when to call 911 or MD.  Also discussed weather considerations and indoor options.  Pt voiced understanding.Pt able to follow exercise prescription today without complaint.  Will continue to monitor for progression.   Dr. Mark Miller is Medical Director for [facility] and [facility]. 

## 2017-03-16 ENCOUNTER — Ambulatory Visit: Payer: Medicare Other

## 2017-03-17 DIAGNOSIS — I272 Pulmonary hypertension, unspecified: Secondary | ICD-10-CM | POA: Diagnosis not present

## 2017-03-17 NOTE — Progress Notes (Signed)
Daily Session Note  Patient Details  Name: Jamie Murray MRN: [id number] Date of Birth: Jun 29, 1945 Referring Provider:     Pulmonary Rehab from 02/27/2017 in [facility] Re[facility]  Referring Provider  Trellis Moment MD      Encounter Date: 03/17/2017  Check In:     Session Check In - 03/17/17 1137      Check-In   Location [facility]   Staff Present Alberteen Sam, MA, ACSM RCEP, Exercise Physiologist;Demarrius Guerrero Alcus Dad, RN BSN   Supervising physician immediately available to respond to emergencies [facility] immediately available ER MD   Physician(s) Dr. Clearnce Hasten and Cinda Quest   Medication changes reported     No   Fall or balance concerns reported    No   Warm-up and Cool-down Performed as group-led instruction   Resistance Training Performed Yes   VAD Patient? No     Pain Assessment   Currently in Pain? No/denies   Multiple Pain Sites No         History  Smoking Status  . Never Smoker  Smokeless Tobacco  . Never Used    Goals Met:  Independence with exercise equipment Exercise tolerated well No report of cardiac concerns or symptoms Strength training completed today  Goals Unmet:  Not Applicable  Comments: Pt able to follow exercise prescription today without complaint.  Will continue to monitor for progression.   Dr. Emily Filbert is Medical Director for [place] and [facility].

## 2017-03-22 DIAGNOSIS — I272 Pulmonary hypertension, unspecified: Secondary | ICD-10-CM | POA: Diagnosis not present

## 2017-03-22 NOTE — Progress Notes (Signed)
Daily Session Note  Patient Details  Name: Koi Yarbro MRN: [id number] Date of Birth: 09-29-1945 Referring Provider:     Pulmonary Rehab from 02/27/2017 in [facility] Re[facility]  Referring Provider  Trellis Moment MD      Encounter Date: 03/22/2017  Check In:     Session Check In - 03/22/17 1146      Check-In   Location [facility]   Staff Present Gerlene Burdock, RN, Geralyn Corwin, RN BSN;Daija Routson Flavia Shipper   Supervising physician immediately available to respond to emergencies [facility] immediately available ER MD   Physician(s) Dr. Mariea Clonts and Joni Fears   Medication changes reported     No   Fall or balance concerns reported    No   Warm-up and Cool-down Performed as group-led instruction   Resistance Training Performed Yes   VAD Patient? No     Pain Assessment   Currently in Pain? No/denies   Multiple Pain Sites No         History  Smoking Status  . Never Smoker  Smokeless Tobacco  . Never Used    Goals Met:  Independence with exercise equipment Exercise tolerated well No report of cardiac concerns or symptoms Strength training completed today  Goals Unmet:  Not Applicable  Comments: Pt able to follow exercise prescription today without complaint.  Will continue to monitor for progression.   Dr. Emily Filbert is Medical Director for [place] and [facility].

## 2017-03-23 ENCOUNTER — Ambulatory Visit: Payer: Medicare Other | Admitting: Internal Medicine

## 2017-03-24 ENCOUNTER — Encounter: Payer: Medicare Other | Admitting: *Deleted

## 2017-03-24 DIAGNOSIS — I272 Pulmonary hypertension, unspecified: Secondary | ICD-10-CM

## 2017-03-24 NOTE — Progress Notes (Signed)
Daily Session Note  Patient Details  Name: Jamie Murray MRN: [id number] Date of Birth: Dec 12, 1945 Referring Provider:     Pulmonary Rehab from 02/27/2017 in [facility] Pul[facility] Rehab  Referring Provider  Trellis Moment MD      Encounter Date: 03/24/2017  Check In:     Session Check In - 03/24/17 1126      Check-In   Location [facility]   Staff Present Renita Papa, RN BSN;Joseph Christain [place], RN BSN   Supervising physician immediately available to respond to emergencies [facility] immediately available ER MD   Physician(s) Dr. Kerman Passey and Quentin Cornwall   Medication changes reported     No   Fall or balance concerns reported    No   Warm-up and Cool-down Performed as group-led instruction   Resistance Training Performed Yes   VAD Patient? No     Pain Assessment   Currently in Pain? No/denies         History  Smoking Status  . Never Smoker  Smokeless Tobacco  . Never Used    Goals Met:  Proper associated with RPD/PD & O2 Sat Independence with exercise equipment Using PLB without cueing & demonstrates good technique Exercise tolerated well Strength training completed today  Goals Unmet:  Not Applicable  Comments: Pt able to follow exercise prescription today without complaint.  Will continue to monitor for progression.    Dr. Emily Filbert is Medical Director for [place] and [facility].

## 2017-03-27 DIAGNOSIS — I272 Pulmonary hypertension, unspecified: Secondary | ICD-10-CM

## 2017-03-27 NOTE — Progress Notes (Signed)
Daily Session Note  Patient Details  Name: Jamie Murray MRN: [id number] Date of Birth: 01-Aug-1945 Referring Provider:     Pulmonary Rehab from 02/27/2017 in [facility] Card[facility] Pul[facility] Rehab  Referring Provider  Trellis Moment MD      Encounter Date: 03/27/2017  Check In:     Session Check In - 03/27/17 1142      Check-In   Location [facility]   Staff Present Nada Maclachlan, BA, ACSM CEP, Exercise Physiologist;Kelly Amedeo Plenty, BS, ACSM CEP, Exercise Physiologist;Miia Blanks Flavia Shipper   Supervising physician immediately available to respond to emergencies [facility] immediately available ER MD   Physician(s) Dr. Kerman Passey and Mariea Clonts   Medication changes reported     No   Fall or balance concerns reported    No   Warm-up and Cool-down Performed as group-led instruction   Resistance Training Performed Yes   VAD Patient? No     Pain Assessment   Currently in Pain? No/denies   Multiple Pain Sites No         History  Smoking Status  . Never Smoker  Smokeless Tobacco  . Never Used    Goals Met:  Exercise tolerated well No report of cardiac concerns or symptoms Strength training completed today  Goals Unmet:  Not Applicable  Comments: Pt able to follow exercise prescription today without complaint.  Will continue to monitor for progression.   Dr. Emily Filbert is Medical Director for [place] and [facility].

## 2017-03-27 NOTE — Progress Notes (Signed)
Pulmonary Individual Treatment Plan  Patient Details  Name: Jamie Murray MRN: [id number] Date of Birth: 1946/04/22 Referring Provider:     Pulmonary Rehab from 02/27/2017 in [facility] Cardiac and Pulmonary Rehab  Referring Provider  Trellis Moment MD      Initial Encounter Date:    Pulmonary Rehab from 02/27/2017 in [facility] Cardiac and Pulmonary Rehab  Date  02/27/17  Referring Provider  Trellis Moment MD      Visit Diagnosis: Pulmonary hypertension ([place])  Patient's Home Medications on Admission:  Current Outpatient Prescriptions:  .  albuterol (PROVENTIL HFA;VENTOLIN HFA) 108 (90 Base) MCG/ACT inhaler, Inhale 2 puffs into the lungs every 4 (four) hours as needed for wheezing or shortness of breath., Disp: 1 Inhaler, Rfl: 2 .  aspirin 81 MG tablet, Take 81 mg by mouth daily., Disp: , Rfl:  .  atorvastatin (LIPITOR) 10 MG tablet, Take 1 tablet (10 mg total) by mouth daily., Disp: 90 tablet, Rfl: 3 .  benzonatate (TESSALON) 200 MG capsule, Take 1 capsule (200 mg total) by mouth 3 (three) times daily as needed., Disp: 30 capsule, Rfl: 0 .  Blood Glucose Monitoring Suppl (GNP EASY TOUCH GLUCOSE METER) DEVI, See admin instructions., Disp: , Rfl: 0 .  buPROPion (WELLBUTRIN SR) 150 MG 12 hr tablet, Take 1 tablet (150 mg total) by mouth 2 (two) times daily. Take 1 tablet in the morning for 2 weeks then take twice and day., Disp: 180 tablet, Rfl: 1 .  cefUROXime (CEFTIN) 500 MG tablet, Take 1 tablet (500 mg total) by mouth 2 (two) times daily., Disp: 20 tablet, Rfl: 0 .  Cholecalciferol (VITAMIN D3) 2000 units TABS, Take 2,000 Units by mouth daily., Disp: , Rfl:  .  clonazePAM (KLONOPIN) 1 MG tablet, Take 1 mg by mouth at bedtime., Disp: , Rfl: 0 .  clopidogrel (PLAVIX) 75 MG tablet, Take 1 tablet (75 mg total) by mouth daily., Disp: 90 tablet, Rfl: 3 .  Dulaglutide (TRULICITY) 1.5 BW/6.2MB SOPN, Inject 1.5 mg into the skin once a week., Disp: 12 pen, Rfl: 3 .  EASY TOUCH TEST test strip, USE TO  CHECK BLOOD SUGAR ONCE DAILY, Disp: , Rfl: 0 .  furosemide (LASIX) 20 MG tablet, Take 20 mg by mouth daily as needed., Disp: , Rfl: 2 .  glipiZIDE (GLUCOTROL XL) 5 MG 24 hr tablet, Take 2 tablets (10 mg total) by mouth daily with breakfast., Disp: 60 tablet, Rfl: 2 .  hydrALAZINE (APRESOLINE) 50 MG tablet, Take 100 mg by mouth 2 (two) times daily., Disp: , Rfl: 1 .  ipratropium-albuterol (DUONEB) 0.5-2.5 (3) MG/3ML SOLN, Take 3 mLs by nebulization every 4 (four) hours as needed., Disp: 360 mL, Rfl: 12 .  isosorbide mononitrate (IMDUR) 30 MG 24 hr tablet, Take 1 tablet (30 mg total) by mouth daily., Disp: 30 tablet, Rfl: 0 .  LORazepam (ATIVAN) 0.5 MG tablet, Take 1 tablet (0.5 mg total) by mouth daily as needed., Disp: 30 tablet, Rfl: 1 .  losartan (COZAAR) 100 MG tablet, Take 100 mg by mouth daily., Disp: , Rfl: 6 .  metFORMIN (GLUCOPHAGE-XR) 500 MG 24 hr tablet, Take 4 tablets (2,000 mg total) by mouth daily with breakfast., Disp: 360 tablet, Rfl: 0 .  metoprolol succinate (TOPROL-XL) 100 MG 24 hr tablet, Take 100 mg by mouth daily. , Disp: , Rfl:  .  Multiple Vitamin (MULTIVITAMIN) tablet, Take 1 tablet by mouth daily., Disp: , Rfl:  .  ondansetron (ZOFRAN) 4 MG tablet, Take 1 tablet (4 mg total)  by mouth every 8 (eight) hours as needed for nausea or vomiting., Disp: 20 tablet, Rfl: 0 .  pantoprazole (PROTONIX) 40 MG tablet, Take 1 tablet (40 mg total) by mouth 2 (two) times daily., Disp: 60 tablet, Rfl: 11 .  potassium chloride (KLOR-CON 10) 10 MEQ tablet, Take 1 tablet (10 mEq total) by mouth 2 (two) times daily., Disp: 60 tablet, Rfl: 0 .  sertraline (ZOLOFT) 100 MG tablet, Take 100 mg by mouth daily., Disp: , Rfl: 3 .  spironolactone (ALDACTONE) 25 MG tablet, Take 25 mg by mouth daily., Disp: , Rfl: 6  Past Medical History: Past Medical History:  Diagnosis Date  . Allergy   . Anemia   . Anxiety   . Depression   . Diabetes ([place])   . GERD (gastroesophageal reflux disease)   .  Hyperlipidemia   . Hypertension   . Insomnia   . Migraines   . Osteoporosis   . [facility] spotted fever   . Transient cerebral ischemia   . Vertigo    every 2-3 months    Tobacco Use: History  Smoking Status  . Never Smoker  Smokeless Tobacco  . Never Used    Labs: Recent Review Flowsheet Data    Labs for ITP Cardiac and Pulmonary Rehab Latest Ref Rng & Units 04/18/2012 07/22/2015 03/07/2016 06/07/2016 11/29/2016   Cholestrol 100 - 199 mg/dL 185 139 145 213(H) 95(L)   LDLCALC 0 - 99 mg/dL 118(H) 63 77 143(H) 45   HDL >39 mg/dL 42 45 43 37(L) 29(L)   Trlycerides 0 - 149 mg/dL 126 156(H) 123 164(H) 105   Hemoglobin A1c - 6.5(H) - 7.8% - -       Pulmonary Assessment Scores:     Pulmonary Assessment Scores    Row Name 02/27/17 1515         ADL UCSD   ADL Phase Entry     SOB Score total 48     Rest 0     Walk 2     Stairs 1     Bath 3     Dress 0     Shop 3       CAT Score   CAT Score 14       mMRC Score   mMRC Score 4        Pulmonary Function Assessment:     Pulmonary Function Assessment - 02/27/17 1525      Breath   Bilateral Breath Sounds Clear   Shortness of Breath No  she feels tired not short of breath       Exercise Target Goals:    Exercise Program Goal: Individual exercise prescription set with THRR, safety & activity barriers. Participant demonstrates ability to understand and report RPE using BORG scale, to self-measure pulse accurately, and to acknowledge the importance of the exercise prescription.  Exercise Prescription Goal: Starting with aerobic activity 30 plus minutes a day, 3 days per week for initial exercise prescription. Provide home exercise prescription and guidelines that participant acknowledges understanding prior to discharge.  Activity Barriers & Risk Stratification:     Activity Barriers & Cardiac Risk Stratification - 02/27/17 1640      Activity Barriers & Cardiac Risk Stratification   Activity Barriers  Deconditioning;Muscular Weakness;Shortness of Breath;Assistive Device      6 Minute Walk:     6 Minute Walk    Row Name 02/27/17 1637         6 Minute Walk   Phase Initial  Distance 610 feet     Walk Time 6 minutes     # of Rest Breaks 0     MPH 1.15     METS 2.08     RPE 9     Perceived Dyspnea  0     VO2 Peak 7.28     Symptoms No  walking with cane and wearing slides     Resting HR 72 bpm     Resting BP 128/66     Resting Oxygen Saturation  99 %     Exercise Oxygen Saturation  during 6 min walk 90 %     Max Ex. HR 98 bpm     Max Ex. BP 152/80     2 Minute Post BP 134/74       Interval HR   1 Minute HR 85     2 Minute HR 86     3 Minute HR 87     4 Minute HR 96     5 Minute HR 92     6 Minute HR 98     2 Minute Post HR 76     Interval Heart Rate? Yes       Interval Oxygen   Interval Oxygen? Yes     Baseline Oxygen Saturation % 99 %     1 Minute Oxygen Saturation % 96 %     1 Minute Liters of Oxygen 0 L  room air     2 Minute Oxygen Saturation % 93 %     2 Minute Liters of Oxygen 0 L     3 Minute Oxygen Saturation % 90 %     3 Minute Liters of Oxygen 0 L     4 Minute Oxygen Saturation % 97 %     4 Minute Liters of Oxygen 0 L     5 Minute Oxygen Saturation % 99 %     5 Minute Liters of Oxygen 0 L     6 Minute Oxygen Saturation % 91 %     6 Minute Liters of Oxygen 0 L     2 Minute Post Oxygen Saturation % 95 %     2 Minute Post Liters of Oxygen 0 L       Oxygen Initial Assessment:     Oxygen Initial Assessment - 02/27/17 1520      Home Oxygen   Home Oxygen Device None   Sleep Oxygen Prescription CPAP   Liters per minute 0   Home Exercise Oxygen Prescription None   Home at Rest Exercise Oxygen Prescription None   Compliance with Home Oxygen Use Yes     Initial 6 min Walk   Oxygen Used None     Program Oxygen Prescription   Program Oxygen Prescription None     Intervention   Short Term Goals To learn and understand importance of  monitoring SPO2 with pulse oximeter and demonstrate accurate use of the pulse oximeter.;To learn and demonstrate proper pursed lip breathing techniques or other breathing techniques.;To learn and understand importance of maintaining oxygen saturations>88%;To learn and demonstrate proper use of respiratory medications   Long  Term Goals Maintenance of O2 saturations>88%;Compliance with respiratory medication;Demonstrates proper use of MDI's;Exhibits proper breathing techniques, such as pursed lip breathing or other method taught during program session;Verbalizes importance of monitoring SPO2 with pulse oximeter and return demonstration      Oxygen Re-Evaluation:     Oxygen Re-Evaluation    Row Name 03/06/17 1240 03/24/17 1402  Program Oxygen Prescription   Program Oxygen Prescription None None        Home Oxygen   Home Oxygen Device None None      Sleep Oxygen Prescription CPAP CPAP      Liters per minute 0 0      Home Exercise Oxygen Prescription None None      Home at Rest Exercise Oxygen Prescription None None      Compliance with Home Oxygen Use Yes  CPAP Yes  CPAP        Goals/Expected Outcomes   Short Term Goals To learn and understand importance of maintaining oxygen saturations>88%;To learn and demonstrate proper use of respiratory medications;To learn and demonstrate proper pursed lip breathing techniques or other breathing techniques.;To learn and understand importance of monitoring SPO2 with pulse oximeter and demonstrate accurate use of the pulse oximeter. To learn and understand importance of maintaining oxygen saturations>88%;To learn and exhibit compliance with exercise, home and travel O2 prescription;To learn and understand importance of monitoring SPO2 with pulse oximeter and demonstrate accurate use of the pulse oximeter.;To learn and demonstrate proper pursed lip breathing techniques or other breathing techniques.;To learn and demonstrate proper use of  respiratory medications      Long  Term Goals Verbalizes importance of monitoring SPO2 with pulse oximeter and return demonstration;Exhibits proper breathing techniques, such as pursed lip breathing or other method taught during program session;Compliance with respiratory medication;Maintenance of O2 saturations>88% Maintenance of O2 saturations>88%;Compliance with respiratory medication;Exhibits compliance with exercise, home and travel O2 prescription;Verbalizes importance of monitoring SPO2 with pulse oximeter and return demonstration;Exhibits proper breathing techniques, such as pursed lip breathing or other method taught during program session      Comments Jilliann is using her CPAP everynight as instructed. Explained to her that PLB can be used to help catch her breath, also that she can purchase a pulse oximeter to moniter her oxygen at home. Explained to her that her oxygen should be 88 percent and above.  [employer] does not have a pulse oximeter at home to check her own oxygen. She states she has been taking her nebulizer and her inhalers as prescribed. She needs to work on PLB techniques while she is in class.       Goals/Expected Outcomes Short: Work on PLB. Obtain a pulse oximeter. Long Be proficient in checking oxygen saturation and PLB Short: Be more proficient with PLB. Long: Be independent with PLB.         Oxygen Discharge (Final Oxygen Re-Evaluation):     Oxygen Re-Evaluation - 03/24/17 1402      Program Oxygen Prescription   Program Oxygen Prescription None     Home Oxygen   Home Oxygen Device None   Sleep Oxygen Prescription CPAP   Liters per minute 0   Home Exercise Oxygen Prescription None   Home at Rest Exercise Oxygen Prescription None   Compliance with Home Oxygen Use Yes  CPAP     Goals/Expected Outcomes   Short Term Goals To learn and understand importance of maintaining oxygen saturations>88%;To learn and exhibit compliance with exercise, home and travel O2  prescription;To learn and understand importance of monitoring SPO2 with pulse oximeter and demonstrate accurate use of the pulse oximeter.;To learn and demonstrate proper pursed lip breathing techniques or other breathing techniques.;To learn and demonstrate proper use of respiratory medications   Long  Term Goals Maintenance of O2 saturations>88%;Compliance with respiratory medication;Exhibits compliance with exercise, home and travel O2 prescription;Verbalizes importance of monitoring SPO2 with pulse oximeter  and return demonstration;Exhibits proper breathing techniques, such as pursed lip breathing or other method taught during program session   Comments [employer]s does not have a pulse oximeter at home to check her own oxygen. She states she has been taking her nebulizer and her inhalers as prescribed. She needs to work on PLB techniques while she is in class.    Goals/Expected Outcomes Short: Be more proficient with PLB. Long: Be independent with PLB.      Initial Exercise Prescription:     Initial Exercise Prescription - 02/27/17 1600      Date of Initial Exercise RX and Referring Provider   Date 02/27/17   Referring Provider Trellis Moment MD     Treadmill   MPH 1   Grade 0.5   Minutes 15   METs 1.83     NuStep   Level 1   SPM 80   Minutes 15   METs 2     REL-XR   Level 1   Speed 50   Minutes 15   METs 2     Prescription Details   Frequency (times per week) 3   Duration Progress to 45 minutes of aerobic exercise without signs/symptoms of physical distress     Intensity   THRR 40-80% of Max Heartrate 103-134   Ratings of Perceived Exertion 11-13   Perceived Dyspnea 0-4     Progression   Progression Continue to progress workloads to maintain intensity without signs/symptoms of physical distress.     Resistance Training   Training Prescription Yes   Weight 3 lbs      Perform Capillary Blood Glucose checks as needed.  Exercise Prescription Changes:     Exercise  Prescription Changes    Row Name 02/27/17 1600 03/14/17 1300 03/15/17 1300         Response to Exercise   Blood Pressure (Admit) 128/66 132/78  -     Blood Pressure (Exercise) 152/80 138/76  -     Blood Pressure (Exit) 134/74 128/64  -     Heart Rate (Admit) 72 bpm 78 bpm  -     Heart Rate (Exercise) 98 bpm 86 bpm  -     Heart Rate (Exit) 76 bpm 70 bpm  -     Oxygen Saturation (Admit) 99 % 95 %  -     Oxygen Saturation (Exercise) 90 % 96 %  -     Oxygen Saturation (Exit) 95 % 94 %  -     Rating of Perceived Exertion (Exercise) 9 11  -     Perceived Dyspnea (Exercise) 0 0  -     Symptoms none none  -     Comments walk test results, wearing slides, using cane second day full day of exercise  -     Duration  - Progress to 45 minutes of aerobic exercise without signs/symptoms of physical distress  -     Intensity  - THRR unchanged  -       Progression   Progression  - Continue to progress workloads to maintain intensity without signs/symptoms of physical distress.  -     Average METs  - 1.87  -       Resistance Training   Training Prescription  - Yes  -     Weight  - 3 lbs  -     Reps  - 10-15  -       Interval Training   Interval Training  - No  -  Treadmill   MPH  - 0  -     Grade  - 0.5  -     Minutes  - 15  -     METs  - 1.83  -       NuStep   Level  - 1  -     SPM  - 60  -     Minutes  - 15  -     METs  - 1.7  -       REL-XR   Level  - 1  -     Speed  - 40  -     Minutes  - 15  -     METs  - 2.1  -       Home Exercise Plan   Plans to continue exercise at  -  - [employer] (comment)  [employer]     Frequency  -  - Add 1 additional day to program exercise sessions.     Initial Home Exercises Provided  -  - 03/15/17        Exercise Comments:     Exercise Comments    Row Name 03/06/17 1157           Exercise Comments First full day of exercise!  Patient was oriented to gym and equipment including functions, settings, policies, and  procedures.  Patient's individual exercise prescription and treatment plan were reviewed.  All starting workloads were established based on the results of the 6 minute walk test done at initial orientation visit.  The plan for exercise progression was also introduced and progression will be customized based on patient's performance and goals.          Exercise Goals and Review:     Exercise Goals    Row Name 02/27/17 1643             Exercise Goals   Increase Physical Activity Yes       Intervention Provide advice, education, support and counseling about physical activity/exercise needs.;Develop an individualized exercise prescription for aerobic and resistive training based on initial evaluation findings, risk stratification, comorbidities and participant's personal goals.       Expected Outcomes Achievement of increased cardiorespiratory fitness and enhanced flexibility, muscular endurance and strength shown through measurements of functional capacity and personal statement of participant.       Increase Strength and Stamina Yes       Intervention Provide advice, education, support and counseling about physical activity/exercise needs.;Develop an individualized exercise prescription for aerobic and resistive training based on initial evaluation findings, risk stratification, comorbidities and participant's personal goals.       Expected Outcomes Achievement of increased cardiorespiratory fitness and enhanced flexibility, muscular endurance and strength shown through measurements of functional capacity and personal statement of participant.       Able to understand and use rate of perceived exertion (RPE) scale Yes       Intervention Provide education and explanation on how to use RPE scale       Expected Outcomes Short Term: Able to use RPE daily in rehab to express subjective intensity level;Long Term:  Able to use RPE to guide intensity level when exercising independently       Able to  understand and use Dyspnea scale Yes       Intervention Provide education and explanation on how to use Dyspnea scale       Expected Outcomes Short Term: Able to use  Dyspnea scale daily in rehab to express subjective sense of shortness of breath during exertion;Long Term: Able to use Dyspnea scale to guide intensity level when exercising independently       Knowledge and understanding of Target Heart Rate Range (THRR) Yes       Intervention Provide education and explanation of THRR including how the numbers were predicted and where they are located for reference       Expected Outcomes Short Term: Able to state/look up THRR;Long Term: Able to use THRR to govern intensity when exercising independently;Short Term: Able to use daily as guideline for intensity in rehab       Able to check pulse independently Yes       Intervention Provide education and demonstration on how to check pulse in carotid and radial arteries.;Review the importance of being able to check your own pulse for safety during independent exercise       Expected Outcomes Short Term: Able to explain why pulse checking is important during independent exercise;Long Term: Able to check pulse independently and accurately       Understanding of Exercise Prescription Yes       Intervention Provide education, explanation, and written materials on patient's individual exercise prescription       Expected Outcomes Short Term: Able to explain program exercise prescription;Long Term: Able to explain home exercise prescription to exercise independently          Exercise Goals Re-Evaluation :     Exercise Goals Re-Evaluation    Row Name 03/14/17 1323 03/15/17 1259           Exercise Goal Re-Evaluation   Exercise Goals Review Increase Physical Activity;Increase Strength and Stamina Increase Physical Activity;Able to understand and use Dyspnea scale;Understanding of Exercise Prescription;Knowledge and understanding of Target Heart Rate Range  (THRR);Increase Strength and Stamina;Able to understand and use rate of perceived exertion (RPE) scale;Able to check pulse independently      Comments Sharlotte is off to a good start with rehab.  She has completed two full days of exercise.  She is now up to 5 min intervals on the treadmill.  We will continue to monitor her progress. Reviewed home exercise with pt today.  Pt plans to walk and use the cross trainer at [employer] for exercise.  Reviewed THR, pulse, RPE, sign and symptoms, NTG use, and when to call 911 or MD.  Also discussed weather considerations and indoor options.  Pt voiced understanding      Expected Outcomes Short: Continue to build up stamina on treadmill.  Long: Continue to increase physical activity.  Short: adding one extra day of exercise a week. Long: Maintain a workout routine independently post [facility].         Discharge Exercise Prescription (Final Exercise Prescription Changes):     Exercise Prescription Changes - 03/15/17 1300      Home Exercise Plan   Plans to continue exercise at [facility] (comment)  [employer]   Frequency Add 1 additional day to program exercise sessions.   Initial Home Exercises Provided 03/15/17      Nutrition:  Target Goals: Understanding of nutrition guidelines, daily intake of sodium <1550m, cholesterol <2057m calories 30% from fat and 7% or less from saturated fats, daily to have 5 or more servings of fruits and vegetables.  Biometrics:     Pre Biometrics - 02/27/17 1643      Pre Biometrics   Height 5' 7.5" (1.715 m)   Weight 151 lb  8 oz (68.7 kg)   Waist Circumference 33 inches   Hip Circumference 36.75 inches   Waist to Hip Ratio 0.9 %   BMI (Calculated) 23.36       Nutrition Therapy Plan and Nutrition Goals:     Nutrition Therapy & Goals - 02/27/17 1514      Intervention Plan   Intervention Prescribe, educate and counsel regarding individualized specific dietary modifications aiming towards  targeted core components such as weight, hypertension, lipid management, diabetes, heart failure and other comorbidities.;Nutrition handout(s) given to patient.   Expected Outcomes Short Term Goal: Understand basic principles of dietary content, such as calories, fat, sodium, cholesterol and nutrients.;Short Term Goal: A plan has been developed with personal nutrition goals set during dietitian appointment.;Long Term Goal: Adherence to prescribed nutrition plan.      Nutrition Discharge: Rate Your Plate Scores:     Nutrition Assessments - 02/27/17 1614      MEDFICTS Scores   Pre Score 65      Nutrition Goals Re-Evaluation:     Nutrition Goals Re-Evaluation    [place] Name 03/17/17 1247             Goals   Current Weight 152 lb 12.8 oz (69.3 kg)       Nutrition Goal Eat healthier and adhere to a diet plan       Comment Shalena has a nutrition appointment November 5th.       Expected Outcome Short: Meet with the dietician. Long: Adhere to a nutrition plan          Nutrition Goals Discharge (Final Nutrition Goals Re-Evaluation):     Nutrition Goals Re-Evaluation - 03/17/17 1247      Goals   Current Weight 152 lb 12.8 oz (69.3 kg)   Nutrition Goal Eat healthier and adhere to a diet plan   Comment Shakela has a nutrition appointment November 5th.   Expected Outcome Short: Meet with the dietician. Long: Adhere to a nutrition plan      Psychosocial: Target Goals: Acknowledge presence or absence of significant depression and/or stress, maximize coping skills, provide positive support system. Participant is able to verbalize types and ability to use techniques and skills needed for reducing stress and depression.   Initial Review & Psychosocial Screening:     Initial Psych Review & Screening - 02/27/17 1511      Initial Review   Current issues with Current Depression;Current Stress Concerns;Current Anxiety/Panic   Source of Stress Concerns Chronic Illness;Family   Comments  Relationship not well with her husband     [place]? Yes   Comments Daughters show her support     Barriers   Psychosocial barriers to participate in program The patient should benefit from training in stress management and relaxation.     Screening Interventions   Interventions Yes;To provide support and resources with identified psychosocial needs;Provide feedback about the scores to participant;Encouraged to exercise;Program counselor consult   Expected Outcomes Short Term goal: Utilizing psychosocial counselor, staff and physician to assist with identification of specific Stressors or current issues interfering with healing process. Setting desired goal for each stressor or current issue identified.;Long Term Goal: Stressors or current issues are controlled or eliminated.;Short Term goal: Identification and review with participant of any Quality of Life or Depression concerns found by scoring the questionnaire.;Long Term goal: The participant improves quality of Life and PHQ9 Scores as seen by post scores and/or verbalization of changes  Quality of Life Scores:   PHQ-9: Recent Review Flowsheet Data    Depression screen [facility] 2/9 02/27/2017 11/29/2016 11/29/2016 07/22/2015 04/28/2015   Decreased Interest _0 0 1   Down, Depressed, Hopeless _1 0 0   PHQ - 2 Score _2 0 1   Altered sleeping 0 3 3 - -   Tired, decreased energy _3 - -   Change in appetite _4 - -   Feeling bad or failure about yourself  _5 - -   Trouble concentrating _6 - -   Moving slowly or fidgety/restless 0 0 0 - -   Suicidal thoughts _7 - -   PHQ-9 Score _8 - -   Difficult doing work/chores Somewhat difficult - - - -     Interpretation of Total Score  Total Score Depression Severity:  1-4 = Minimal depression, 5-9 = Mild depression, 10-14 = Moderate depression, 15-19 = Moderately severe depression, 20-27 = Severe depression   Psychosocial Evaluation and  Intervention:   Psychosocial Re-Evaluation:     Psychosocial Re-Evaluation    Row Name 03/24/17 1405             Psychosocial Re-Evaluation   Current issues with Current Depression;Current Stress Concerns       Comments Keir states that her home life has not changed since the start of the program. She states that if she could live alone she would. Peggs states she does not get along with her husband. She seems really depressed as of late. She states that she has a doctors appointment for her depression.       Expected Outcomes Short: Meet with the mental health counselor. Long: decrease PHQ9 scores.       Interventions Encouraged to attend Pulmonary Rehabilitation for the exercise;Stress management education       Continue Psychosocial Services  Follow up required by counselor          Psychosocial Discharge (Final Psychosocial Re-Evaluation):     Psychosocial Re-Evaluation - 03/24/17 1405      Psychosocial Re-Evaluation   Current issues with Current Depression;Current Stress Concerns   Comments Laketia states that her home life has not changed since the start of the program. She states that if she could live alone she would. Peggs states she does not get along with her husband. She seems really depressed as of late. She states that she has a doctors appointment for her depression.   Expected Outcomes Short: Meet with the mental health counselor. Long: decrease PHQ9 scores.   Interventions Encouraged to attend Pulmonary Rehabilitation for the exercise;Stress management education   Continue Psychosocial Services  Follow up required by counselor      Education: Education Goals: Education classes will be provided on a weekly basis, covering required topics. Participant will state understanding/return demonstration of topics presented.  Learning Barriers/Preferences:     Learning Barriers/Preferences - 02/27/17 1643      Learning Barriers/Preferences   Learning Barriers None    Learning Preferences None      Education Topics: Initial Evaluation Education: - Verbal, written and demonstration of respiratory meds, RPE/PD scales, oximetry and breathing techniques. Instruction on use of nebulizers and MDIs: cleaning and proper use, rinsing mouth with steroid doses and importance of monitoring MDI activations.   Pulmonary Rehab from 03/22/2017 in [facility] Cardiac and Pulmonary Rehab  Date  02/27/17  Educator  [facility]  Instruction Review  Code  1- Verbalizes Understanding      General Nutrition Guidelines/Fats and Fiber: -Group instruction provided by verbal, written material, models and posters to present the general guidelines for heart healthy nutrition. Gives an explanation and review of dietary fats and fiber.   Pulmonary Rehab from 03/22/2017 in [facility] Cardiac and Pulmonary Rehab  Date  02/27/17  Educator  [facility]  Instruction Review Code  1- Verbalizes Understanding      Controlling Sodium/Reading Food Labels: -Group verbal and written material supporting the discussion of sodium use in heart healthy nutrition. Review and explanation with models, verbal and written materials for utilization of the food label.   Exercise Physiology & Risk Factors: - Group verbal and written instruction with models to review the exercise physiology of the cardiovascular system and associated critical values. Details cardiovascular disease risk factors and the goals associated with each risk factor.   Aerobic Exercise & Resistance Training: - Gives group verbal and written discussion on the health impact of inactivity. On the components of aerobic and resistive training programs and the benefits of this training and how to safely progress through these programs.   Flexibility, Balance, General Exercise Guidelines: - Provides group verbal and written instruction on the benefits of flexibility and balance training programs. Provides general exercise guidelines with specific guidelines to  those with heart or lung disease. Demonstration and skill practice provided.   Stress Management: - Provides group verbal and written instruction about the health risks of elevated stress, cause of high stress, and healthy ways to reduce stress.   Depression: - Provides group verbal and written instruction on the correlation between heart/lung disease and depressed mood, treatment options, and the stigmas associated with seeking treatment.   Exercise & Equipment Safety: - Individual verbal instruction and demonstration of equipment use and safety with use of the equipment.   Infection Prevention: - Provides verbal and written material to individual with discussion of infection control including proper hand washing and proper equipment cleaning during exercise session.   Pulmonary Rehab from 03/22/2017 in [facility] Cardiac and Pulmonary Rehab  Date  02/27/17  Educator  [facility]  Instruction Review Code  1- Verbalizes Understanding      Falls Prevention: - Provides verbal and written material to individual with discussion of falls prevention and safety.   Pulmonary Rehab from 03/22/2017 in [facility] Cardiac and Pulmonary Rehab  Date  02/27/17  Educator  [facility]  Instruction Review Code  1- Verbalizes Understanding      Diabetes: - Individual verbal and written instruction to review signs/symptoms of diabetes, desired ranges of glucose level fasting, after meals and with exercise. Advice that pre and post exercise glucose checks will be done for 3 sessions at entry of program.   Chronic Lung Diseases: - Group verbal and written instruction to review new updates, new respiratory medications, new advancements in procedures and treatments. Provide informative websites and "800" numbers of self-education.   Pulmonary Rehab from 03/22/2017 in [facility] Cardiac and Pulmonary Rehab  Date  03/08/17  Educator  [facility]  Instruction Review Code  1- Verbalizes Understanding      Lung Procedures: - Group verbal and  written instruction to describe testing methods done to diagnose lung disease. Review the outcome of test results. Describe the treatment choices: Pulmonary Function Tests, ABGs and oximetry.   Energy Conservation: - Provide group verbal and written instruction for methods to conserve energy, plan and organize activities. Instruct on pacing techniques, use of adaptive equipment and posture/positioning to relieve shortness  of breath.   Triggers: - Group verbal and written instruction to review types of environmental controls: home humidity, furnaces, filters, dust mite/pet prevention, HEPA vacuums. To discuss weather changes, air quality and the benefits of nasal washing.   Exacerbations: - Group verbal and written instruction to provide: warning signs, infection symptoms, calling MD promptly, preventive modes, and value of vaccinations. Review: effective airway clearance, coughing and/or vibration techniques. Create an Sports administrator.   Oxygen: - Individual and group verbal and written instruction on oxygen therapy. Includes supplement oxygen, available portable oxygen systems, continuous and intermittent flow rates, oxygen safety, concentrators, and Medicare reimbursement for oxygen.   Respiratory Medications: - Group verbal and written instruction to review medications for lung disease. Drug class, frequency, complications, importance of spacers, rinsing mouth after steroid MDI's, and proper cleaning methods for nebulizers.   AED/CPR: - Group verbal and written instruction with the use of models to demonstrate the basic use of the AED with the basic ABC's of resuscitation.   Breathing Retraining: - Provides individuals verbal and written instruction on purpose, frequency, and proper technique of diaphragmatic breathing and pursed-lipped breathing. Applies individual practice skills.   Pulmonary Rehab from 03/22/2017 in [facility] Cardiac and Pulmonary Rehab  Date  02/27/17  Educator  [facility]   Instruction Review Code  1- Verbalizes Understanding      Anatomy and Physiology of the Lungs: - Group verbal and written instruction with the use of models to provide basic lung anatomy and physiology related to function, structure and complications of lung disease.   Anatomy & Physiology of the Heart: - Group verbal and written instruction and models provide basic cardiac anatomy and physiology, with the coronary electrical and arterial systems. Review of: AMI, Angina, Valve disease, Heart Failure, Cardiac Arrhythmia, Pacemakers, and the ICD.   Heart Failure: - Group verbal and written instruction on the basics of heart failure: signs/symptoms, treatments, explanation of ejection fraction, enlarged heart and cardiomyopathy.   Sleep Apnea: - Individual verbal and written instruction to review Obstructive Sleep Apnea. Review of risk factors, methods for diagnosing and types of masks and machines for OSA.   Pulmonary Rehab from 03/22/2017 in [facility] Cardiac and Pulmonary Rehab  Date  02/27/17  Educator  [facility]  Instruction Review Code  1- Verbalizes Understanding      Anxiety: - Provides group, verbal and written instruction on the correlation between heart/lung disease and anxiety, treatment options, and management of anxiety.   Relaxation: - Provides group, verbal and written instruction about the benefits of relaxation for patients with heart/lung disease. Also provides patients with examples of relaxation techniques.   Pulmonary Rehab from 03/22/2017 in [facility] Cardiac and Pulmonary Rehab  Date  03/22/17  Educator  [facility]  Instruction Review Code  1- Verbalizes Understanding      Cardiac Medications: - Group verbal and written instruction to review commonly prescribed medications for heart disease. Reviews the medication, class of the drug, and side effects.   Know Your Numbers: -Group verbal and written instruction about important numbers in your health.  Review of Cholesterol,  Blood Pressure, Diabetes, and BMI and the role they play in your overall health.   Pulmonary Rehab from 03/22/2017 in [facility] Cardiac and Pulmonary Rehab  Date  03/17/17  Educator  [facility]  Instruction Review Code  1- Verbalizes Understanding      Other: -Provides group and verbal instruction on various topics (see comments)    Knowledge Questionnaire Score:     Knowledge Questionnaire Score - 02/27/17 1519  Knowledge Questionnaire Score   Pre Score 8/10       Core Components/Risk Factors/Patient Goals at Admission:     Personal Goals and Risk Factors at Admission - 02/27/17 1526      Core Components/Risk Factors/Patient Goals on Admission    Weight Management Weight Maintenance;Yes   Intervention Weight Management: Develop a combined nutrition and exercise program designed to reach desired caloric intake, while maintaining appropriate intake of nutrient and fiber, sodium and fats, and appropriate energy expenditure required for the weight goal.;Weight Management: Provide education and appropriate resources to help participant work on and attain dietary goals.;Weight Management/Obesity: Establish reasonable short term and long term weight goals.   Admit Weight 151 lb 8 oz (68.7 kg)   Goal Weight: Short Term 146 lb (66.2 kg)   Goal Weight: Long Term 150 lb (68 kg)   Expected Outcomes Short Term: Continue to assess and modify interventions until short term weight is achieved;Long Term: Adherence to nutrition and physical activity/exercise program aimed toward attainment of established weight goal;Weight Maintenance: Understanding of the daily nutrition guidelines, which includes 25-35% calories from fat, 7% or less cal from saturated fats, less than 226m cholesterol, less than 1.5gm of sodium, & 5 or more servings of fruits and vegetables daily;Understanding recommendations for meals to include 15-35% energy as protein, 25-35% energy from fat, 35-60% energy from carbohydrates, less than  2053mof dietary cholesterol, 20-35 gm of total fiber daily;Understanding of distribution of calorie intake throughout the day with the consumption of 4-5 meals/snacks   Diabetes Yes   Intervention Provide education about signs/symptoms and action to take for hypo/hyperglycemia.;Provide education about proper nutrition, including hydration, and aerobic/resistive exercise prescription along with prescribed medications to achieve blood glucose in normal ranges: Fasting glucose 65-99 mg/dL   Expected Outcomes Short Term: Participant verbalizes understanding of the signs/symptoms and immediate care of hyper/hypoglycemia, proper foot care and importance of medication, aerobic/resistive exercise and nutrition plan for blood glucose control.;Long Term: Attainment of HbA1C < 7%.   Hypertension Yes   Intervention Provide education on lifestyle modifcations including regular physical activity/exercise, weight management, moderate sodium restriction and increased consumption of fresh fruit, vegetables, and low fat dairy, alcohol moderation, and smoking cessation.;Monitor prescription use compliance.   Expected Outcomes Short Term: Continued assessment and intervention until BP is < 140/9054mG in hypertensive participants. < 130/29m14m in hypertensive participants with diabetes, heart failure or chronic kidney disease.;Long Term: Maintenance of blood pressure at goal levels.   Stress Yes   Intervention Offer individual and/or small group education and counseling on adjustment to heart disease, stress management and health-related lifestyle change. Teach and support self-help strategies.;Refer participants experiencing significant psychosocial distress to appropriate mental health specialists for further evaluation and treatment. When possible, include family members and significant others in education/counseling sessions.   Expected Outcomes Short Term: Participant demonstrates changes in health-related behavior,  relaxation and other stress management skills, ability to obtain effective social support, and compliance with psychotropic medications if prescribed.;Long Term: Emotional wellbeing is indicated by absence of clinically significant psychosocial distress or social isolation.      Core Components/Risk Factors/Patient Goals Review:      Goals and Risk Factor Review    Row Name 03/24/17 1358             Core Components/Risk Factors/Patient Goals Review   Personal Goals Review Weight Management/Obesity;Improve shortness of breath with ADL's;Diabetes;Stress;Hypertension       Review PeggLeverne had alot of stress dealing with her home  situation. She states her husband and her do not get along and she would rather not be living with him. She states her diabetes has been under control and she checks her levels at home.  Her ADL's have not  improved much since the start of the program .        Expected Outcomes Short: attend regularly to decrease stress. Long: Maintain a healthy stress free home.          Core Components/Risk Factors/Patient Goals at Discharge (Final Review):      Goals and Risk Factor Review - 03/24/17 1358      Core Components/Risk Factors/Patient Goals Review   Personal Goals Review Weight Management/Obesity;Improve shortness of breath with ADL's;Diabetes;Stress;Hypertension   Review Lowana has had alot of stress dealing with her home situation. She states her husband and her do not get along and she would rather not be living with him. She states her diabetes has been under control and she checks her levels at home.  Her ADL's have not  improved much since the start of the program .    Expected Outcomes Short: attend regularly to decrease stress. Long: Maintain a healthy stress free home.      ITP Comments:     ITP Comments    Row Name 02/27/17 1626 03/06/17 1240 03/08/17 1312 03/27/17 0834     ITP Comments Medical evaluation completed. Chart sent to Dr. Emily Filbert  director of [place] for signature and review. Spent 15 minutes talking with Dayami about PLB. Hisako Chelcey Caputo did not complete her rehab session. Her pre exercise CBG was 72, crackers given. CBG went down to 61, gel given. Her CBG went up to 82. Patient given education on importance of protein and checking her sugar regularly. She was instructed to eat lunch right when she gets home.  30 day review completed. ITP sent to Dr. Emily Filbert Director of [place]. Continue with ITP unless changes are made by physician.         Comments: 30 day review

## 2017-03-29 DIAGNOSIS — I272 Pulmonary hypertension, unspecified: Secondary | ICD-10-CM | POA: Diagnosis not present

## 2017-03-29 NOTE — Progress Notes (Signed)
Daily Session Note  Patient Details  Name: Jamie Murray MRN: [id number] Date of Birth: 1945-09-03 Referring Provider:     Pulmonary Rehab from 02/27/2017 in [facility] Cardiac and Pulmonary Rehab  Referring Provider  Trellis Moment MD      Encounter Date: 03/29/2017  Check In:     Session Check In - 03/29/17 1141      Check-In   Location [facility]-Cardiac & Pulmonary Rehab   Staff Present Renita Papa, RN BSN;Dequante Tremaine Darrin Nipper, [place], ACSM RCEP, Exercise Physiologist   Supervising physician immediately available to respond to emergencies [facility] immediately available ER MD   Physician(s) Dr. Jimmye Norman and Clearnce Hasten   Medication changes reported     No   Fall or balance concerns reported    No   Warm-up and Cool-down Performed as group-led instruction   Resistance Training Performed Yes   VAD Patient? No     Pain Assessment   Currently in Pain? No/denies         History  Smoking Status  . Never Smoker  Smokeless Tobacco  . Never Used    Goals Met:  Proper associated with RPD/PD & O2 Sat Independence with exercise equipment Using PLB without cueing & demonstrates good technique Exercise tolerated well Strength training completed today  Goals Unmet:  Not Applicable  Comments: Pt able to follow exercise prescription today without complaint.  Will continue to monitor for progression.    Dr. Emily Filbert is Medical Director for [place] and [facility] Pulmonary Rehabilitation.

## 2017-03-30 ENCOUNTER — Other Ambulatory Visit: Payer: Self-pay | Admitting: Internal Medicine

## 2017-03-30 ENCOUNTER — Encounter: Payer: Self-pay | Admitting: Internal Medicine

## 2017-04-06 ENCOUNTER — Ambulatory Visit: Payer: Medicare Other | Admitting: Internal Medicine

## 2017-04-07 ENCOUNTER — Encounter: Payer: Medicare Other | Attending: Internal Medicine

## 2017-04-07 DIAGNOSIS — I272 Pulmonary hypertension, unspecified: Secondary | ICD-10-CM | POA: Diagnosis not present

## 2017-04-07 NOTE — Progress Notes (Signed)
Daily Session Note  Patient Details  Name: Bentli Llorente MRN: [id number] Date of Birth: June 30, 1945 Referring Provider:     Pulmonary Rehab from 02/27/2017 in [facility] Cardiac and Pulmonary Rehab  Referring Provider  Trellis Moment MD      Encounter Date: 04/07/2017  Check In:     Session Check In - 04/07/17 1110      Check-In   Location [facility]-Cardiac & Pulmonary Rehab   Staff Present Alberteen Sam, MA, ACSM RCEP, Exercise Physiologist;Joseph Flavia Shipper   Supervising physician immediately available to respond to emergencies [facility] immediately available ER MD   Physician(s) Dr. Burlene Arnt and Clearnce Hasten   Medication changes reported     No   Fall or balance concerns reported    No   Warm-up and Cool-down Performed as group-led instruction   Resistance Training Performed Yes   VAD Patient? No     Pain Assessment   Currently in Pain? No/denies         History  Smoking Status  . Never Smoker  Smokeless Tobacco  . Never Used    Goals Met:  Exercise tolerated well No report of cardiac concerns or symptoms Strength training completed today  Goals Unmet:  Not Applicable  Comments: Pt able to follow exercise prescription today without complaint.  Will continue to monitor for progression.   Dr. Emily Filbert is Medical Director for [place] and [facility] Pulmonary Rehabilitation.

## 2017-04-10 DIAGNOSIS — I272 Pulmonary hypertension, unspecified: Secondary | ICD-10-CM

## 2017-04-10 NOTE — Progress Notes (Signed)
Daily Session Note  Patient Details  Name: Jamie Murray MRN: [id number] Date of Birth: 1946-04-14 Referring Provider:     Pulmonary Rehab from 02/27/2017 in [facility] Cardiac and Pulmonary Rehab  Referring Provider  Trellis Moment MD      Encounter Date: 04/10/2017  Check In: Session Check In - 04/10/17 1153      Check-In   Location  [facility]-Cardiac & Pulmonary Rehab    Staff Present  Earlean Shawl, BS, ACSM CEP, Exercise Physiologist;Amanda Oletta Darter, BA, ACSM CEP, Exercise Physiologist;Gor Vestal Flavia Shipper    Supervising physician immediately available to respond to emergencies  [facility] immediately available ER MD    Physician(s)  Dr. Jimmye Norman and Alfred Levins    Medication changes reported      No    Fall or balance concerns reported     No    Warm-up and Cool-down  Performed as group-led instruction    Resistance Training Performed  Yes    VAD Patient?  No      Pain Assessment   Currently in Pain?  No/denies          Social History   Tobacco Use  Smoking Status Never Smoker  Smokeless Tobacco Never Used    Goals Met:  Independence with exercise equipment Exercise tolerated well No report of cardiac concerns or symptoms Strength training completed today  Goals Unmet:  Not Applicable  Comments: Pt able to follow exercise prescription today without complaint.  Will continue to monitor for progression.   Dr. Emily Filbert is Medical Director for [place] and [facility] Pulmonary Rehabilitation.

## 2017-04-12 ENCOUNTER — Encounter: Payer: Medicare Other | Admitting: *Deleted

## 2017-04-12 ENCOUNTER — Other Ambulatory Visit: Payer: Self-pay | Admitting: *Deleted

## 2017-04-12 ENCOUNTER — Inpatient Hospital Stay
Admission: RE | Admit: 2017-04-12 | Discharge: 2017-04-12 | Disposition: A | Discharge status: Home / Self Care | Payer: Self-pay | Source: Ambulatory Visit | Attending: *Deleted | Admitting: *Deleted

## 2017-04-12 DIAGNOSIS — Z9289 Personal history of other medical treatment: Secondary | ICD-10-CM

## 2017-04-12 DIAGNOSIS — I272 Pulmonary hypertension, unspecified: Secondary | ICD-10-CM | POA: Diagnosis not present

## 2017-04-12 NOTE — Progress Notes (Signed)
Daily Session Note  Patient Details  Name: Shayanne Gomm MRN: [id number] Date of Birth: [birth year] Referring Provider:     Pulmonary Rehab from 02/27/2017 in [facility] Cardiac and Pulmonary Rehab  Referring Provider  Trellis Moment MD      Encounter Date: 04/12/2017  Check In: Session Check In - 04/12/17 1130      Check-In   Location  [facility]-Cardiac & Pulmonary Rehab    Staff Present  Renita Papa, RN BSN;Joseph Darrin Nipper, [place], ACSM RCEP, Exercise Physiologist    Supervising physician immediately available to respond to emergencies  [facility] immediately available ER MD    Physician(s)  Dr. Jimmye Norman and [facility]    Medication changes reported      No    Fall or balance concerns reported     No    Warm-up and Cool-down  Performed as group-led instruction    Resistance Training Performed  Yes    VAD Patient?  No      Pain Assessment   Currently in Pain?  No/denies        Exercise Prescription Changes - 04/11/17 1600      Response to Exercise   Blood Pressure (Admit)  114/64    Blood Pressure (Exit)  128/80    Heart Rate (Admit)  70 bpm    Heart Rate (Exercise)  95 bpm    Heart Rate (Exit)  85 bpm    Oxygen Saturation (Admit)  95 %    Oxygen Saturation (Exercise)  97 %    Oxygen Saturation (Exit)  97 %    Rating of Perceived Exertion (Exercise)  13    Perceived Dyspnea (Exercise)  0    Symptoms  none    Duration  Continue with 45 min of aerobic exercise without signs/symptoms of physical distress.    Intensity  THRR unchanged      Progression   Progression  Continue to progress workloads to maintain intensity without signs/symptoms of physical distress.    Average METs  2.55      Resistance Training   Training Prescription  Yes    Weight  3 lbs    Reps  10-15      Interval Training   Interval Training  No      Treadmill   MPH  1.5    Grade  1.5    Minutes  15    METs  2.46      NuStep   Level  5    SPM  81    Minutes  15    METs  2.2      REL-XR   Level  1    Speed  42    Minutes  15    METs  3      Home Exercise Plan   Plans to continue exercise at  [employer] (comment) [place]   Frequency  Add 1 additional day to program exercise sessions.    Initial Home Exercises Provided  03/15/17       Social History   Tobacco Use  Smoking Status Never Smoker  Smokeless Tobacco Never Used    Goals Met:  Proper associated with RPD/PD & O2 Sat Independence with exercise equipment Using PLB without cueing & demonstrates good technique Exercise tolerated well Personal goals reviewed Strength training completed today  Goals Unmet:  Not Applicable  Comments: Pt able to follow exercise prescription today without complaint.  Will continue to monitor for progression.  Dr. Emily Filbert is Medical Director for [place] and [facility].

## 2017-04-14 DIAGNOSIS — I272 Pulmonary hypertension, unspecified: Secondary | ICD-10-CM | POA: Diagnosis not present

## 2017-04-14 NOTE — Progress Notes (Signed)
Daily Session Note  Patient Details  Name: Jamie Murray MRN: [id number] Date of Birth: 06/15/1945 Referring Provider:     Pulmonary Rehab from 02/27/2017 in [facility] Cardiac and Pulmonary Rehab  Referring Provider  Trellis Moment MD      Encounter Date: 04/14/2017  Check In: Session Check In - 04/14/17 1135      Check-In   Location  [facility]-Cardiac & Pulmonary Rehab    Staff Present  Justin Mend Lorre Nick, [place], ACSM RCEP, Exercise Physiologist;Meredith Sherryll Burger, RN BSN    Supervising physician immediately available to respond to emergencies  [facility] immediately available ER MD    Physician(s)  Dr. Mable Paris and [facility]    Medication changes reported      No    Fall or balance concerns reported     No    Warm-up and Cool-down  Performed as group-led instruction    Resistance Training Performed  Yes    VAD Patient?  No      Pain Assessment   Currently in Pain?  No/denies          Social History   Tobacco Use  Smoking Status Never Smoker  Smokeless Tobacco Never Used    Goals Met:  Independence with exercise equipment Exercise tolerated well No report of cardiac concerns or symptoms Strength training completed today  Goals Unmet:  Not Applicable  Comments:  Pt able to follow exercise prescription today without complaint.  Will continue to monitor for progression.   Dr. Emily Filbert is Medical Director for [place] and [facility].

## 2017-04-19 DIAGNOSIS — I272 Pulmonary hypertension, unspecified: Secondary | ICD-10-CM

## 2017-04-19 NOTE — Progress Notes (Signed)
Daily Session Note  Patient Details  Name: Jamie Murray MRN: [id number] Date of Birth: 12/13/1945 Referring Provider:     Pulmonary Rehab from 02/27/2017 in [facility] Cardiac and Pulmonary Rehab  Referring Provider  Kasa, Kurnin MD      Encounter Date: 04/19/2017  Check In: Session Check In - 04/19/17 1124      Check-In   Location  [facility]-Cardiac & Pulmonary Rehab    Staff Present  Meredith Craven, RN BSN;Joseph Hood RCP,RRT,BSRT;Jessica Hawkins, MA, ACSM RCEP, Exercise Physiologist    Supervising physician immediately available to respond to emergencies  [facility] immediately available ER MD    Physician(s)  Dr. Williams and [place]    Medication changes reported      No    Fall or balance concerns reported     No    Warm-up and Cool-down  Performed as group-led instruction    Resistance Training Performed  Yes    VAD Patient?  No      Pain Assessment   Currently in Pain?  No/denies          Social History   Tobacco Use  Smoking Status Never Smoker  Smokeless Tobacco Never Used    Goals Met:  Independence with exercise equipment Exercise tolerated well No report of cardiac concerns or symptoms Strength training completed today  Goals Unmet:  Not Applicable  Comments: Pt able to follow exercise prescription today without complaint.  Will continue to monitor for progression.   Dr. Mark Miller is Medical Director for [facility] and [facility]. 

## 2017-04-21 DIAGNOSIS — I272 Pulmonary hypertension, unspecified: Secondary | ICD-10-CM | POA: Diagnosis not present

## 2017-04-21 NOTE — Progress Notes (Signed)
Daily Session Note  Patient Details  Name: Jamie Murray MRN: [id number] Date of Birth: [birth year] Referring Provider:     Pulmonary Rehab from 02/27/2017 in [facility] Cardiac and Pulmonary Rehab  Referring Provider  Trellis Moment MD      Encounter Date: 04/21/2017  Check In: Session Check In - 04/21/17 1125      Check-In   Location  [facility]-Cardiac & Pulmonary Rehab    Staff Present  Renita Papa, RN BSN;Joseph Darrin Nipper, [place], ACSM RCEP, Exercise Physiologist    Supervising physician immediately available to respond to emergencies  [facility] immediately available ER MD    Physician(s)  Dr. Corky Downs and Burlene Arnt    Medication changes reported      No    Fall or balance concerns reported     No    Warm-up and Cool-down  Performed as group-led instruction    Resistance Training Performed  Yes    VAD Patient?  No      Pain Assessment   Currently in Pain?  No/denies          Social History   Tobacco Use  Smoking Status Never Smoker  Smokeless Tobacco Never Used    Goals Met:  Proper associated with RPD/PD & O2 Sat Independence with exercise equipment Using PLB without cueing & demonstrates good technique Exercise tolerated well No report of cardiac concerns or symptoms Strength training completed today  Goals Unmet:  Not Applicable  Comments: Pt able to follow exercise prescription today without complaint.  Will continue to monitor for progression.    Dr. Emily Filbert is Medical Director for [place] and [facility].

## 2017-04-24 ENCOUNTER — Telehealth: Payer: Self-pay

## 2017-04-24 DIAGNOSIS — I272 Pulmonary hypertension, unspecified: Secondary | ICD-10-CM

## 2017-04-24 NOTE — Progress Notes (Signed)
Pulmonary Individual Treatment Plan  Patient Details  Name: Jamie Murray MRN: [id number] Date of Birth: 1946/05/23 Referring Provider:     Pulmonary Rehab from 02/27/2017 in [facility] Cardiac and Pulmonary Rehab  Referring Provider  Trellis Moment MD      Initial Encounter Date:    Pulmonary Rehab from 02/27/2017 in [facility] Cardiac and Pulmonary Rehab  Date  02/27/17  Referring Provider  Trellis Moment MD      Visit Diagnosis: Pulmonary hypertension ([place])  Patient's Home Medications on Admission:  Current Outpatient Medications:  .  aspirin 81 MG tablet, Take 81 mg by mouth daily., Disp: , Rfl:  .  atorvastatin (LIPITOR) 10 MG tablet, Take 1 tablet (10 mg total) by mouth daily., Disp: 90 tablet, Rfl: 3 .  benzonatate (TESSALON) 200 MG capsule, Take 1 capsule (200 mg total) by mouth 3 (three) times daily as needed., Disp: 30 capsule, Rfl: 0 .  Blood Glucose Monitoring Suppl (GNP EASY TOUCH GLUCOSE METER) DEVI, See admin instructions., Disp: , Rfl: 0 .  buPROPion (WELLBUTRIN SR) 150 MG 12 hr tablet, Take 1 tablet (150 mg total) by mouth 2 (two) times daily. Take 1 tablet in the morning for 2 weeks then take twice and day., Disp: 180 tablet, Rfl: 1 .  cefUROXime (CEFTIN) 500 MG tablet, Take 1 tablet (500 mg total) by mouth 2 (two) times daily., Disp: 20 tablet, Rfl: 0 .  Cholecalciferol (VITAMIN D3) 2000 units TABS, Take 2,000 Units by mouth daily., Disp: , Rfl:  .  clonazePAM (KLONOPIN) 1 MG tablet, Take 1 mg by mouth at bedtime., Disp: , Rfl: 0 .  clopidogrel (PLAVIX) 75 MG tablet, Take 1 tablet (75 mg total) by mouth daily., Disp: 90 tablet, Rfl: 3 .  Dulaglutide (TRULICITY) 1.5 UV/2.5DG SOPN, Inject 1.5 mg into the skin once a week., Disp: 12 pen, Rfl: 3 .  EASY TOUCH TEST test strip, USE TO CHECK BLOOD SUGAR ONCE DAILY, Disp: , Rfl: 0 .  furosemide (LASIX) 20 MG tablet, Take 20 mg by mouth daily as needed., Disp: , Rfl: 2 .  glipiZIDE (GLUCOTROL XL) 5 MG 24 hr tablet, Take 2 tablets  (10 mg total) by mouth daily with breakfast., Disp: 60 tablet, Rfl: 2 .  hydrALAZINE (APRESOLINE) 50 MG tablet, Take 100 mg by mouth 2 (two) times daily., Disp: , Rfl: 1 .  ipratropium-albuterol (DUONEB) 0.5-2.5 (3) MG/3ML SOLN, Take 3 mLs by nebulization every 4 (four) hours as needed., Disp: 360 mL, Rfl: 12 .  isosorbide mononitrate (IMDUR) 30 MG 24 hr tablet, Take 1 tablet (30 mg total) by mouth daily., Disp: 30 tablet, Rfl: 0 .  LORazepam (ATIVAN) 0.5 MG tablet, Take 1 tablet (0.5 mg total) by mouth daily as needed., Disp: 30 tablet, Rfl: 1 .  losartan (COZAAR) 100 MG tablet, Take 100 mg by mouth daily., Disp: , Rfl: 6 .  metFORMIN (GLUCOPHAGE-XR) 500 MG 24 hr tablet, Take 4 tablets (2,000 mg total) by mouth daily with breakfast., Disp: 360 tablet, Rfl: 0 .  metoprolol succinate (TOPROL-XL) 100 MG 24 hr tablet, Take 100 mg by mouth daily. , Disp: , Rfl:  .  Multiple Vitamin (MULTIVITAMIN) tablet, Take 1 tablet by mouth daily., Disp: , Rfl:  .  ondansetron (ZOFRAN) 4 MG tablet, Take 1 tablet (4 mg total) by mouth every 8 (eight) hours as needed for nausea or vomiting., Disp: 20 tablet, Rfl: 0 .  pantoprazole (PROTONIX) 40 MG tablet, Take 1 tablet (40 mg total) by mouth 2 (two)  times daily., Disp: 60 tablet, Rfl: 11 .  potassium chloride (KLOR-CON 10) 10 MEQ tablet, Take 1 tablet (10 mEq total) by mouth 2 (two) times daily., Disp: 60 tablet, Rfl: 0 .  PROAIR HFA 108 (90 Base) MCG/ACT inhaler, Inhale 2 puffs into the lungs every 4 (four) hours as needed for wheezing or shortness of breath., Disp: 8.5 g, Rfl: 2 .  sertraline (ZOLOFT) 100 MG tablet, Take 100 mg by mouth daily., Disp: , Rfl: 3 .  spironolactone (ALDACTONE) 25 MG tablet, Take 25 mg by mouth daily., Disp: , Rfl: 6  Past Medical History: Past Medical History:  Diagnosis Date  . Allergy   . Anemia   . Anxiety   . Depression   . Diabetes ([place])   . GERD (gastroesophageal reflux disease)   . Hyperlipidemia   . Hypertension   .  Insomnia   . Migraines   . Osteoporosis   . [facility] spotted fever   . Transient cerebral ischemia   . Vertigo    every 2-3 months    Tobacco Use: Social History   Tobacco Use  Smoking Status Never Smoker  Smokeless Tobacco Never Used    Labs: Recent Review Flowsheet Data    Labs for ITP Cardiac and Pulmonary Rehab Latest Ref Rng & Units 04/18/2012 07/22/2015 03/07/2016 06/07/2016 11/29/2016   Cholestrol 100 - 199 mg/dL 185 139 145 213(H) 95(L)   LDLCALC 0 - 99 mg/dL 118(H) 63 77 143(H) 45   HDL >39 mg/dL 42 45 43 37(L) 29(L)   Trlycerides 0 - 149 mg/dL 126 156(H) 123 164(H) 105   Hemoglobin A1c - 6.5(H) - 7.8% - -       Pulmonary Assessment Scores: Pulmonary Assessment Scores    Row Name 02/27/17 1515         ADL UCSD   ADL Phase  Entry     SOB Score total  48     Rest  0     Walk  2     Stairs  1     Bath  3     Dress  0     Shop  3       CAT Score   CAT Score  14       mMRC Score   mMRC Score  4        Pulmonary Function Assessment: Pulmonary Function Assessment - 02/27/17 1525      Breath   Bilateral Breath Sounds  Clear    Shortness of Breath  No she feels tired not short of breath        Exercise Target Goals:    Exercise Program Goal: Individual exercise prescription set with THRR, safety & activity barriers. Participant demonstrates ability to understand and report RPE using BORG scale, to self-measure pulse accurately, and to acknowledge the importance of the exercise prescription.  Exercise Prescription Goal: Starting with aerobic activity 30 plus minutes a day, 3 days per week for initial exercise prescription. Provide home exercise prescription and guidelines that participant acknowledges understanding prior to discharge.  Activity Barriers & Risk Stratification: Activity Barriers & Cardiac Risk Stratification - 02/27/17 1640      Activity Barriers & Cardiac Risk Stratification   Activity Barriers  Deconditioning;Muscular  Weakness;Shortness of Breath;Assistive Device       6 Minute Walk: 6 Minute Walk    Row Name 02/27/17 1637         6 Minute Walk   Phase  Initial  Distance  610 feet     Walk Time  6 minutes     # of Rest Breaks  0     MPH  1.15     METS  2.08     RPE  9     Perceived Dyspnea   0     VO2 Peak  7.28     Symptoms  No walking with cane and wearing slides     Resting HR  72 bpm     Resting BP  128/66     Resting Oxygen Saturation   99 %     Exercise Oxygen Saturation  during 6 min walk  90 %     Max Ex. HR  98 bpm     Max Ex. BP  152/80     2 Minute Post BP  134/74       Interval HR   1 Minute HR  85     2 Minute HR  86     3 Minute HR  87     4 Minute HR  96     5 Minute HR  92     6 Minute HR  98     2 Minute Post HR  76     Interval Heart Rate?  Yes       Interval Oxygen   Interval Oxygen?  Yes     Baseline Oxygen Saturation %  99 %     1 Minute Oxygen Saturation %  96 %     1 Minute Liters of Oxygen  0 L room air     2 Minute Oxygen Saturation %  93 %     2 Minute Liters of Oxygen  0 L     3 Minute Oxygen Saturation %  90 %     3 Minute Liters of Oxygen  0 L     4 Minute Oxygen Saturation %  97 %     4 Minute Liters of Oxygen  0 L     5 Minute Oxygen Saturation %  99 %     5 Minute Liters of Oxygen  0 L     6 Minute Oxygen Saturation %  91 %     6 Minute Liters of Oxygen  0 L     2 Minute Post Oxygen Saturation %  95 %     2 Minute Post Liters of Oxygen  0 L       Oxygen Initial Assessment: Oxygen Initial Assessment - 02/27/17 1520      Home Oxygen   Home Oxygen Device  None    Sleep Oxygen Prescription  CPAP    Liters per minute  0    Home Exercise Oxygen Prescription  None    Home at Rest Exercise Oxygen Prescription  None    Compliance with Home Oxygen Use  Yes      Initial 6 min Walk   Oxygen Used  None      Program Oxygen Prescription   Program Oxygen Prescription  None      Intervention   Short Term Goals  To learn and understand  importance of monitoring SPO2 with pulse oximeter and demonstrate accurate use of the pulse oximeter.;To learn and demonstrate proper pursed lip breathing techniques or other breathing techniques.;To learn and understand importance of maintaining oxygen saturations>88%;To learn and demonstrate proper use of respiratory medications    Long  Term Goals  Maintenance of O2 saturations>88%;Compliance with  respiratory medication;Demonstrates proper use of MDI's;Exhibits proper breathing techniques, such as pursed lip breathing or other method taught during program session;Verbalizes importance of monitoring SPO2 with pulse oximeter and return demonstration       Oxygen Re-Evaluation: Oxygen Re-Evaluation    Row Name 03/06/17 1240 03/24/17 1402 04/10/17 1606         Program Oxygen Prescription   Program Oxygen Prescription  None  None  None       Home Oxygen   Home Oxygen Device  None  None  None     Sleep Oxygen Prescription  CPAP  CPAP  CPAP     Liters per minute  0  0  0     Home Exercise Oxygen Prescription  None  None  None     Home at Rest Exercise Oxygen Prescription  None  None  None     Compliance with Home Oxygen Use  Yes CPAP  Yes CPAP  Yes CPAP       Goals/Expected Outcomes   Short Term Goals  To learn and understand importance of maintaining oxygen saturations>88%;To learn and demonstrate proper use of respiratory medications;To learn and demonstrate proper pursed lip breathing techniques or other breathing techniques.;To learn and understand importance of monitoring SPO2 with pulse oximeter and demonstrate accurate use of the pulse oximeter.  To learn and understand importance of maintaining oxygen saturations>88%;To learn and exhibit compliance with exercise, home and travel O2 prescription;To learn and understand importance of monitoring SPO2 with pulse oximeter and demonstrate accurate use of the pulse oximeter.;To learn and demonstrate proper pursed lip breathing techniques or  other breathing techniques.;To learn and demonstrate proper use of respiratory medications  To learn and understand importance of maintaining oxygen saturations>88%;To learn and demonstrate proper use of respiratory medications;To learn and exhibit compliance with exercise, home and travel O2 prescription;To learn and demonstrate proper pursed lip breathing techniques or other breathing techniques.;To learn and understand importance of monitoring SPO2 with pulse oximeter and demonstrate accurate use of the pulse oximeter.     Long  Term Goals  Verbalizes importance of monitoring SPO2 with pulse oximeter and return demonstration;Exhibits proper breathing techniques, such as pursed lip breathing or other method taught during program session;Compliance with respiratory medication;Maintenance of O2 saturations>88%  Maintenance of O2 saturations>88%;Compliance with respiratory medication;Exhibits compliance with exercise, home and travel O2 prescription;Verbalizes importance of monitoring SPO2 with pulse oximeter and return demonstration;Exhibits proper breathing techniques, such as pursed lip breathing or other method taught during program session  Exhibits compliance with exercise, home and travel O2 prescription;Maintenance of O2 saturations>88%;Verbalizes importance of monitoring SPO2 with pulse oximeter and return demonstration;Compliance with respiratory medication;Exhibits proper breathing techniques, such as pursed lip breathing or other method taught during program session     Comments  Janiylah is using her CPAP everynight as instructed. Explained to her that PLB can be used to help catch her breath, also that she can purchase a pulse oximeter to moniter her oxygen at home. Explained to her that her oxygen should be 88 percent and above.   Pegggs does not have a pulse oximeter at home to check her own oxygen. She states she has been taking her nebulizer and her inhalers as prescribed. She needs to work on  PLB techniques while she is in class.   Caylea states that she takes her nebulizer once a day. She takes her albuterol inhaler PRN and has not used it lately. She does not wear home oxygen but is on a CPAP  at night. She states she wears her CPAP every night. Her oxygen has not fallen below 88 percent on any day in [facility].      Goals/Expected Outcomes  Short: Work on PLB. Obtain a pulse oximeter. Long Be proficient in checking oxygen saturation and PLB  Short: Be more proficient with PLB. Long: Be independent with PLB.  Short; obtain a pulse oximeter. Long: check oxygen saturation at home independently.        Oxygen Discharge (Final Oxygen Re-Evaluation): Oxygen Re-Evaluation - 04/10/17 1606      Program Oxygen Prescription   Program Oxygen Prescription  None      Home Oxygen   Home Oxygen Device  None    Sleep Oxygen Prescription  CPAP    Liters per minute  0    Home Exercise Oxygen Prescription  None    Home at Rest Exercise Oxygen Prescription  None    Compliance with Home Oxygen Use  Yes CPAP      Goals/Expected Outcomes   Short Term Goals  To learn and understand importance of maintaining oxygen saturations>88%;To learn and demonstrate proper use of respiratory medications;To learn and exhibit compliance with exercise, home and travel O2 prescription;To learn and demonstrate proper pursed lip breathing techniques or other breathing techniques.;To learn and understand importance of monitoring SPO2 with pulse oximeter and demonstrate accurate use of the pulse oximeter.    Long  Term Goals  Exhibits compliance with exercise, home and travel O2 prescription;Maintenance of O2 saturations>88%;Verbalizes importance of monitoring SPO2 with pulse oximeter and return demonstration;Compliance with respiratory medication;Exhibits proper breathing techniques, such as pursed lip breathing or other method taught during program session    Comments  Imojean states that she takes her nebulizer once a day.  She takes her albuterol inhaler PRN and has not used it lately. She does not wear home oxygen but is on a CPAP at night. She states she wears her CPAP every night. Her oxygen has not fallen below 88 percent on any day in [facility].     Goals/Expected Outcomes  Short; obtain a pulse oximeter. Long: check oxygen saturation at home independently.       Initial Exercise Prescription: Initial Exercise Prescription - 02/27/17 1600      Date of Initial Exercise RX and Referring Provider   Date  02/27/17    Referring Provider  Trellis Moment MD      Treadmill   MPH  1    Grade  0.5    Minutes  15    METs  1.83      NuStep   Level  1    SPM  80    Minutes  15    METs  2      REL-XR   Level  1    Speed  50    Minutes  15    METs  2      Prescription Details   Frequency (times per week)  3    Duration  Progress to 45 minutes of aerobic exercise without signs/symptoms of physical distress      Intensity   THRR 40-80% of Max Heartrate  103-134    Ratings of Perceived Exertion  11-13    Perceived Dyspnea  0-4      Progression   Progression  Continue to progress workloads to maintain intensity without signs/symptoms of physical distress.      Resistance Training   Training Prescription  Yes    Weight  3 lbs  Perform Capillary Blood Glucose checks as needed.  Exercise Prescription Changes: Exercise Prescription Changes    Row Name 02/27/17 1600 03/14/17 1300 03/15/17 1300 03/29/17 1400 04/11/17 1600     Response to Exercise   Blood Pressure (Admit)  128/66  132/78  -  118/62  114/64   Blood Pressure (Exercise)  152/80  138/76  -  -  -   Blood Pressure (Exit)  134/74  128/64  -  128/70  128/80   Heart Rate (Admit)  72 bpm  78 bpm  -  72 bpm  70 bpm   Heart Rate (Exercise)  98 bpm  86 bpm  -  96 bpm  95 bpm   Heart Rate (Exit)  76 bpm  70 bpm  -  76 bpm  85 bpm   Oxygen Saturation (Admit)  99 %  95 %  -  96 %  95 %   Oxygen Saturation (Exercise)  90 %  96 %  -  94 %  97  %   Oxygen Saturation (Exit)  95 %  94 %  -  93 %  97 %   Rating of Perceived Exertion (Exercise)  9  11  -  13  13   Perceived Dyspnea (Exercise)  0  0  -  0  0   Symptoms  none  none  -  none  none   Comments  walk test results, wearing slides, using cane  second day full day of exercise  -  -  -   Duration  -  Progress to 45 minutes of aerobic exercise without signs/symptoms of physical distress  -  Continue with 45 min of aerobic exercise without signs/symptoms of physical distress.  Continue with 45 min of aerobic exercise without signs/symptoms of physical distress.   Intensity  -  THRR unchanged  -  THRR unchanged  THRR unchanged     Progression   Progression  -  Continue to progress workloads to maintain intensity without signs/symptoms of physical distress.  -  Continue to progress workloads to maintain intensity without signs/symptoms of physical distress.  Continue to progress workloads to maintain intensity without signs/symptoms of physical distress.   Average METs  -  1.87  -  2.57  2.55     Resistance Training   Training Prescription  -  Yes  -  Yes  Yes   Weight  -  3 lbs  -  3 lbs  3 lbs   Reps  -  10-15  -  10-15  10-15     Interval Training   Interval Training  -  No  -  No  No     Treadmill   MPH  -  0  -  1.5  1.5   Grade  -  0.5  -  1.5  1.5   Minutes  -  15  -  15  15   METs  -  1.83  -  2.46  2.46     NuStep   Level  -  1  -  2  5   SPM  -  60  -  70  81   Minutes  -  15  -  15  15   METs  -  1.7  -  2.9  2.2     REL-XR   Level  -  1  -  1  1   Speed  -  40  -  44  42   Minutes  -  15  -  15  15   METs  -  2.1  -  2.4  3     Home Exercise Plan   Plans to continue exercise at  -  -  [employer] (comment) [employer] (medical) (comment) [employer] (medical) (comment) [employer]   Frequency  -  -  Add 1 additional day to program exercise sessions.  Add 1 additional day to program exercise sessions.  Add 1 additional  day to program exercise sessions.   Initial Home Exercises Provided  -  -  03/15/17  03/15/17  03/15/17      Exercise Comments: Exercise Comments    Row Name 03/06/17 1157           Exercise Comments  First full day of exercise!  Patient was oriented to gym and equipment including functions, settings, policies, and procedures.  Patient's individual exercise prescription and treatment plan were reviewed.  All starting workloads were established based on the results of the 6 minute walk test done at initial orientation visit.  The plan for exercise progression was also introduced and progression will be customized based on patient's performance and goals.          Exercise Goals and Review: Exercise Goals    Row Name 02/27/17 1643             Exercise Goals   Increase Physical Activity  Yes       Intervention  Provide advice, education, support and counseling about physical activity/exercise needs.;Develop an individualized exercise prescription for aerobic and resistive training based on initial evaluation findings, risk stratification, comorbidities and participant's personal goals.       Expected Outcomes  Achievement of increased cardiorespiratory fitness and enhanced flexibility, muscular endurance and strength shown through measurements of functional capacity and personal statement of participant.       Increase Strength and Stamina  Yes       Intervention  Provide advice, education, support and counseling about physical activity/exercise needs.;Develop an individualized exercise prescription for aerobic and resistive training based on initial evaluation findings, risk stratification, comorbidities and participant's personal goals.       Expected Outcomes  Achievement of increased cardiorespiratory fitness and enhanced flexibility, muscular endurance and strength shown through measurements of functional capacity and personal statement of participant.       Able to understand and  use rate of perceived exertion (RPE) scale  Yes       Intervention  Provide education and explanation on how to use RPE scale       Expected Outcomes  Short Term: Able to use RPE daily in rehab to express subjective intensity level;Long Term:  Able to use RPE to guide intensity level when exercising independently       Able to understand and use Dyspnea scale  Yes       Intervention  Provide education and explanation on how to use Dyspnea scale       Expected Outcomes  Short Term: Able to use Dyspnea scale daily in rehab to express subjective sense of shortness of breath during exertion;Long Term: Able to use Dyspnea scale to guide intensity level when exercising independently       Knowledge and understanding of Target Heart Rate Range (THRR)  Yes       Intervention  Provide education and explanation of THRR including how the numbers were predicted and where  they are located for reference       Expected Outcomes  Short Term: Able to state/look up THRR;Long Term: Able to use THRR to govern intensity when exercising independently;Short Term: Able to use daily as guideline for intensity in rehab       Able to check pulse independently  Yes       Intervention  Provide education and demonstration on how to check pulse in carotid and radial arteries.;Review the importance of being able to check your own pulse for safety during independent exercise       Expected Outcomes  Short Term: Able to explain why pulse checking is important during independent exercise;Long Term: Able to check pulse independently and accurately       Understanding of Exercise Prescription  Yes       Intervention  Provide education, explanation, and written materials on patient's individual exercise prescription       Expected Outcomes  Short Term: Able to explain program exercise prescription;Long Term: Able to explain home exercise prescription to exercise independently          Exercise Goals Re-Evaluation : Exercise Goals  Re-Evaluation    Row Name 03/14/17 1323 03/15/17 1259 03/29/17 1442 04/11/17 1557       Exercise Goal Re-Evaluation   Exercise Goals Review  Increase Physical Activity;Increase Strength and Stamina  Increase Physical Activity;Able to understand and use Dyspnea scale;Understanding of Exercise Prescription;Knowledge and understanding of Target Heart Rate Range (THRR);Increase Strength and Stamina;Able to understand and use rate of perceived exertion (RPE) scale;Able to check pulse independently  Increase Physical Activity;Increase Strength and Stamina  Increase Physical Activity;Increase Strength and Stamina;Understanding of Exercise Prescription    Comments  Lore is off to a good start with rehab.  She has completed two full days of exercise.  She is now up to 5 min intervals on the treadmill.  We will continue to monitor her progress.  Reviewed home exercise with pt today.  Pt plans to walk and use the cross trainer at [employer] for exercise.  Reviewed THR, pulse, RPE, sign and symptoms, NTG use, and when to call 911 or MD.  Also discussed weather considerations and indoor options.  Pt voiced understanding  Tambi is off to a great start in rehab.  She is now up to 1.5 mph on the treadmill.  We will continue to monitor her progression.   Julienne has continued to do well in rehab.  She has finally cut down her nail to be able to monitor her oxygen saturation with probe.  She is now up to level 5 on the NuStep.  We will continue monitor her progression.    Expected Outcomes  Short: Continue to build up stamina on treadmill.  Long: Continue to increase physical activity.   Short: adding one extra day of exercise a week. Long: Maintain a workout routine independently post [facility].  Short: Increase workloads on XR and NuStep.  Long: Get into routine of regular exercise.   Short: Try to increase spms on both XR and NuStep.  Long: Continue to exercise on her own.        Discharge Exercise Prescription  (Final Exercise Prescription Changes): Exercise Prescription Changes - 04/11/17 1600      Response to Exercise   Blood Pressure (Admit)  114/64    Blood Pressure (Exit)  128/80    Heart Rate (Admit)  70 bpm    Heart Rate (Exercise)  95 bpm    Heart Rate (Exit)  85 bpm    Oxygen Saturation (Admit)  95 %    Oxygen Saturation (Exercise)  97 %    Oxygen Saturation (Exit)  97 %    Rating of Perceived Exertion (Exercise)  13    Perceived Dyspnea (Exercise)  0    Symptoms  none    Duration  Continue with 45 min of aerobic exercise without signs/symptoms of physical distress.    Intensity  THRR unchanged      Progression   Progression  Continue to progress workloads to maintain intensity without signs/symptoms of physical distress.    Average METs  2.55      Resistance Training   Training Prescription  Yes    Weight  3 lbs    Reps  10-15      Interval Training   Interval Training  No      Treadmill   MPH  1.5    Grade  1.5    Minutes  15    METs  2.46      NuStep   Level  5    SPM  81    Minutes  15    METs  2.2      REL-XR   Level  1    Speed  42    Minutes  15    METs  3      Home Exercise Plan   Plans to continue exercise at  [employer] (comment) [employer]    Frequency  Add 1 additional day to program exercise sessions.    Initial Home Exercises Provided  03/15/17       Nutrition:  Target Goals: Understanding of nutrition guidelines, daily intake of sodium <1541m, cholesterol <2062m calories 30% from fat and 7% or less from saturated fats, daily to have 5 or more servings of fruits and vegetables.  Biometrics: Pre Biometrics - 02/27/17 1643      Pre Biometrics   Height  5' 7.5" (1.715 m)    Weight  151 lb 8 oz (68.7 kg)    Waist Circumference  33 inches    Hip Circumference  36.75 inches    Waist to Hip Ratio  0.9 %    BMI (Calculated)  23.36        Nutrition Therapy Plan and Nutrition Goals: Nutrition Therapy & Goals - 04/10/17 1006       Nutrition Therapy   Diet  Instructed on a meal plan including dietary guidelines for lung health and diabetes    Drug/Food Interactions  Statins/Certain Fruits    Protein (specify units)  6    Fiber  20 grams    Whole Grain Foods  3 servings    Saturated Fats  12 max. grams    Fruits and Vegetables  5 servings/day    Sodium  1500 grams      Personal Nutrition Goals   Nutrition Goal  Measure out some starchy foods. Count 1 cup serving as 2 servings of carbohydrate.    Personal Goal #2  Balance meals with protein, 2-3 servings of carbohydrate and non-starchy vegetables.    Personal Goal #3  Read labels for sodium.    Personal Goal #4  Limit sugar sweetened beverages.      Intervention Plan   Intervention  Prescribe, educate and counsel regarding individualized specific dietary modifications aiming towards targeted core components such as weight, hypertension, lipid management, diabetes, heart failure and other comorbidities.;Nutrition handout(s) given to patient.    Expected Outcomes  Short Term Goal: Understand basic principles of dietary content, such as calories, fat, sodium, cholesterol and nutrients.;Short Term Goal: A plan has been developed with personal nutrition goals set during dietitian appointment.;Long Term Goal: Adherence to prescribed nutrition plan.       Nutrition Discharge: Rate Your Plate Scores: Nutrition Assessments - 02/27/17 1614      MEDFICTS Scores   Pre Score  65       Nutrition Goals Re-Evaluation: Nutrition Goals Re-Evaluation    Alden Name 03/17/17 1247             Goals   Current Weight  152 lb 12.8 oz (69.3 kg)       Nutrition Goal  Eat healthier and adhere to a diet plan       Comment  Arleny has a nutrition appointment November 5th.       Expected Outcome  Short: Meet with the dietician. Long: Adhere to a nutrition plan          Nutrition Goals Discharge (Final Nutrition Goals Re-Evaluation): Nutrition Goals Re-Evaluation - 03/17/17  1247      Goals   Current Weight  152 lb 12.8 oz (69.3 kg)    Nutrition Goal  Eat healthier and adhere to a diet plan    Comment  Aaleigha has a nutrition appointment November 5th.    Expected Outcome  Short: Meet with the dietician. Long: Adhere to a nutrition plan       Psychosocial: Target Goals: Acknowledge presence or absence of significant depression and/or stress, maximize coping skills, provide positive support system. Participant is able to verbalize types and ability to use techniques and skills needed for reducing stress and depression.   Initial Review & Psychosocial Screening: Initial Psych Review & Screening - 02/27/17 1511      Initial Review   Current issues with  Current Depression;Current Stress Concerns;Current Anxiety/Panic    Source of Stress Concerns  Chronic Illness;Family    Comments  Relationship not well with her husband      [place]?  Yes    Comments  Daughters show her support      Barriers   Psychosocial barriers to participate in program  The patient should benefit from training in stress management and relaxation.      Screening Interventions   Interventions  Yes;To provide support and resources with identified psychosocial needs;Provide feedback about the scores to participant;Encouraged to exercise;Program counselor consult    Expected Outcomes  Short Term goal: Utilizing psychosocial counselor, staff and physician to assist with identification of specific Stressors or current issues interfering with healing process. Setting desired goal for each stressor or current issue identified.;Long Term Goal: Stressors or current issues are controlled or eliminated.;Short Term goal: Identification and review with participant of any Quality of Life or Depression concerns found by scoring the questionnaire.;Long Term goal: The participant improves quality of Life and PHQ9 Scores as seen by post scores and/or verbalization of changes        Quality of Life Scores:   PHQ-9: Recent Review Flowsheet Data    Depression screen [facility] 2/9 02/27/2017 11/29/2016 11/29/2016 07/22/2015 04/28/2015   Decreased Interest _0 0 1   Down, Depressed, Hopeless _1 0 0   PHQ - 2 Score _2 0 1   Altered sleeping 0 3 3 - -   Tired, decreased energy _3 - -   Change in appetite _4 - -  Feeling bad or failure about yourself  _0 - -   Trouble concentrating _1 - -   Moving slowly or fidgety/restless 0 0 0 - -   Suicidal thoughts _2 - -   PHQ-9 Score _3 - -   Difficult doing work/chores Somewhat difficult - - - -     Interpretation of Total Score  Total Score Depression Severity:  1-4 = Minimal depression, 5-9 = Mild depression, 10-14 = Moderate depression, 15-19 = Moderately severe depression, 20-27 = Severe depression   Psychosocial Evaluation and Intervention: Psychosocial Evaluation - 03/27/17 1240      Psychosocial Evaluation & Interventions   Interventions  Encouraged to exercise with the program and follow exercise prescription;Therapist referral;Relaxation education;Stress management education    Comments  Counselor met with Ms. Georgann Housekeeper [facility]) for initial psychosocial evaluation.  She is a 71 year old who has pulmonary hypertension.  Jojo has a strong support system with a daughter and son who live locally.  She is also married but reports her marriage is not positive and he hasn't been supportive of her for the past 10 years.  Allie struggles with diabetes and sleep apnea and has a CPAP for the past 2 months to help with sleep - but she reports it does not.  She admits to a history of depression and anxiety for approximately 5-6 years.  She states her mood is negative most of the time and she is "unhappy" currently.  Alvis's primary stress is her marriage and her health.  Counselor assessed Kyria for SI/HI due to the report on the PHQ-9 of "18" indicating "moderately severe" for depression and the answer to #9  with "thoughts of being better off dead or hurting yourself in some way" answered a "2" meaning she has these thoughts more than half the days.  Najmah states she will never hurt herself because she has (6) grandchildren that she adores and brings her joy.  Counselor encouraged Onyx see a therapist in the near future and she reports she has a new contact that her insurance will cover now.  She agreed to contact this provider and get on their schedule asap.  Rolla also feels that her medications are too strong and she is seeing the Dr. about this on Thursday with hopes that some will be discontinued.  Counselor may recommend a psychiatrist if her mood does not improve in the near future due to her PHQ scores and her answer to #9.  Counselor will follow with Tewana throughout the course of this program.     Expected Outcomes  Sharyl will benefit from consistent exercise to achieve her stated goals to "get thru this program."  She will also benefit from the educational and psychoeducational components of this program to learn improved coping strategies to help with her mood and stress level.  Pia will contact and begin seeing a therpist in the near future.  She will speak to her Dr. about medication changes as well.  Counselor will follow.    Continue Psychosocial Services   Follow up required by counselor       Psychosocial Re-Evaluation: Psychosocial Re-Evaluation    [place]ie Name 03/24/17 3790 04/07/17 1248           Psychosocial Re-Evaluation   Current issues with  Current Depression;Current Stress Concerns  Current Depression;Current Stress Concerns      Comments  Breslin states that her home life has not changed since the start  of the program. She states that if she could live alone she would. Peggs states she does not get along with her husband. She seems really depressed as of late. She states that she has a doctors appointment for her depression.  Christionna states that her husband is more like a roomate.  She does not tell the family about there problems becuase she feels strong enough that she does not need to. She states this has been going on for many years. She comes to [place] becuase her doctor and her children want her to. She has been seeing a therapist but she will not be able to see her anymore due to her insurance changing. She pays for [employer] but does not go.  Informed her that it would be good to exercise when the program is over to maintain fitness and decrease stress.      Expected Outcomes  Short: Meet with the mental health counselor. Long: decrease PHQ9 scores.  Short:continue [facility] to decrease stress. Long: see a therapist to help cope with depression.      Interventions  Encouraged to attend Pulmonary Rehabilitation for the exercise;Stress management education  Encouraged to attend Pulmonary Rehabilitation for the exercise      Continue Psychosocial Services   Follow up required by counselor  Follow up required by counselor         Psychosocial Discharge (Final Psychosocial Re-Evaluation): Psychosocial Re-Evaluation - 04/07/17 1248      Psychosocial Re-Evaluation   Current issues with  Current Depression;Current Stress Concerns    Comments  Peyten states that her husband is more like a roomate. She does not tell the family about there problems becuase she feels strong enough that she does not need to. She states this has been going on for many years. She comes to [place] becuase her doctor and her children want her to. She has been seeing a therapist but she will not be able to see her anymore due to her insurance changing. She pays for [employer] but does not go.  Informed her that it would be good to exercise when the program is over to maintain fitness and decrease stress.    Expected Outcomes  Short:continue [facility] to decrease stress. Long: see a therapist to help cope with depression.    Interventions  Encouraged to attend Pulmonary Rehabilitation for the  exercise    Continue Psychosocial Services   Follow up required by counselor       Education: Education Goals: Education classes will be provided on a weekly basis, covering required topics. Participant will state understanding/return demonstration of topics presented.  Learning Barriers/Preferences: Learning Barriers/Preferences - 02/27/17 1643      Learning Barriers/Preferences   Learning Barriers  None    Learning Preferences  None       Education Topics: Initial Evaluation Education: - Verbal, written and demonstration of respiratory meds, RPE/PD scales, oximetry and breathing techniques. Instruction on use of nebulizers and MDIs: cleaning and proper use, rinsing mouth with steroid doses and importance of monitoring MDI activations.   Pulmonary Rehab from 04/21/2017 in [facility] Cardiac and Pulmonary Rehab  Date  02/27/17  Educator  [facility]  Instruction Review Code  1- Verbalizes Understanding      General Nutrition Guidelines/Fats and Fiber: -Group instruction provided by verbal, written material, models and posters to present the general guidelines for heart healthy nutrition. Gives an explanation and review of dietary fats and fiber.   Pulmonary Rehab from 04/21/2017 in [facility] Cardiac and  Pulmonary Rehab  Date  02/27/17  Educator  [facility]  Instruction Review Code  1- Verbalizes Understanding      Controlling Sodium/Reading Food Labels: -Group verbal and written material supporting the discussion of sodium use in heart healthy nutrition. Review and explanation with models, verbal and written materials for utilization of the food label.   Pulmonary Rehab from 04/21/2017 in [facility] Cardiac and Pulmonary Rehab  Date  03/27/17  Educator  [facility]  Instruction Review Code  1- Verbalizes Understanding      Exercise Physiology & Risk Factors: - Group verbal and written instruction with models to review the exercise physiology of the cardiovascular system and associated critical values. Details  cardiovascular disease risk factors and the goals associated with each risk factor.   Aerobic Exercise & Resistance Training: - Gives group verbal and written discussion on the health impact of inactivity. On the components of aerobic and resistive training programs and the benefits of this training and how to safely progress through these programs.   Pulmonary Rehab from 04/21/2017 in [facility] Cardiac and Pulmonary Rehab  Date  04/07/17  Educator  [facility]  Instruction Review Code  1- Verbalizes Understanding      Flexibility, Balance, General Exercise Guidelines: - Provides group verbal and written instruction on the benefits of flexibility and balance training programs. Provides general exercise guidelines with specific guidelines to those with heart or lung disease. Demonstration and skill practice provided.   Stress Management: - Provides group verbal and written instruction about the health risks of elevated stress, cause of high stress, and healthy ways to reduce stress.   Depression: - Provides group verbal and written instruction on the correlation between heart/lung disease and depressed mood, treatment options, and the stigmas associated with seeking treatment.   Pulmonary Rehab from 04/21/2017 in [facility] Cardiac and Pulmonary Rehab  Date  04/19/17  Educator  [facility]  Instruction Review Code  1- Verbalizes Understanding      Exercise & Equipment Safety: - Individual verbal instruction and demonstration of equipment use and safety with use of the equipment.   Infection Prevention: - Provides verbal and written material to individual with discussion of infection control including proper hand washing and proper equipment cleaning during exercise session.   Pulmonary Rehab from 04/21/2017 in [facility] Cardiac and Pulmonary Rehab  Date  02/27/17  Educator  [facility]  Instruction Review Code  1- Verbalizes Understanding      Falls Prevention: - Provides verbal and written material to individual  with discussion of falls prevention and safety.   Pulmonary Rehab from 04/21/2017 in [facility] Cardiac and Pulmonary Rehab  Date  02/27/17  Educator  [facility]  Instruction Review Code  1- Verbalizes Understanding      Diabetes: - Individual verbal and written instruction to review signs/symptoms of diabetes, desired ranges of glucose level fasting, after meals and with exercise. Advice that pre and post exercise glucose checks will be done for 3 sessions at entry of program.   Chronic Lung Diseases: - Group verbal and written instruction to review new updates, new respiratory medications, new advancements in procedures and treatments. Provide informative websites and "800" numbers of self-education.   Pulmonary Rehab from 04/21/2017 in [facility] Cardiac and Pulmonary Rehab  Date  03/08/17  Educator  [facility]  Instruction Review Code  1- Verbalizes Understanding      Lung Procedures: - Group verbal and written instruction to describe testing methods done to diagnose lung disease. Review the outcome of test results. Describe the treatment choices:  Pulmonary Function Tests, ABGs and oximetry.   Energy Conservation: - Provide group verbal and written instruction for methods to conserve energy, plan and organize activities. Instruct on pacing techniques, use of adaptive equipment and posture/positioning to relieve shortness of breath.   Triggers: - Group verbal and written instruction to review types of environmental controls: home humidity, furnaces, filters, dust mite/pet prevention, HEPA vacuums. To discuss weather changes, air quality and the benefits of nasal washing.   Exacerbations: - Group verbal and written instruction to provide: warning signs, infection symptoms, calling MD promptly, preventive modes, and value of vaccinations. Review: effective airway clearance, coughing and/or vibration techniques. Create an Sports administrator.   Oxygen: - Individual and group verbal and written instruction on  oxygen therapy. Includes supplement oxygen, available portable oxygen systems, continuous and intermittent flow rates, oxygen safety, concentrators, and Medicare reimbursement for oxygen.   Respiratory Medications: - Group verbal and written instruction to review medications for lung disease. Drug class, frequency, complications, importance of spacers, rinsing mouth after steroid MDI's, and proper cleaning methods for nebulizers.   AED/CPR: - Group verbal and written instruction with the use of models to demonstrate the basic use of the AED with the basic ABC's of resuscitation.   Breathing Retraining: - Provides individuals verbal and written instruction on purpose, frequency, and proper technique of diaphragmatic breathing and pursed-lipped breathing. Applies individual practice skills.   Pulmonary Rehab from 04/21/2017 in [facility] Cardiac and Pulmonary Rehab  Date  02/27/17  Educator  [facility]  Instruction Review Code  1- Verbalizes Understanding      Anatomy and Physiology of the Lungs: - Group verbal and written instruction with the use of models to provide basic lung anatomy and physiology related to function, structure and complications of lung disease.   Anatomy & Physiology of the Heart: - Group verbal and written instruction and models provide basic cardiac anatomy and physiology, with the coronary electrical and arterial systems. Review of: AMI, Angina, Valve disease, Heart Failure, Cardiac Arrhythmia, Pacemakers, and the ICD.   Pulmonary Rehab from 04/21/2017 in [facility] Cardiac and Pulmonary Rehab  Date  04/21/17  Educator  [facility]  Instruction Review Code  1- Verbalizes Understanding      Heart Failure: - Group verbal and written instruction on the basics of heart failure: signs/symptoms, treatments, explanation of ejection fraction, enlarged heart and cardiomyopathy.   Pulmonary Rehab from 04/21/2017 in [facility] Cardiac and Pulmonary Rehab  Date  04/21/17  Educator  [place]    Instruction Review Code  1- Verbalizes Understanding      Sleep Apnea: - Individual verbal and written instruction to review Obstructive Sleep Apnea. Review of risk factors, methods for diagnosing and types of masks and machines for OSA.   Pulmonary Rehab from 04/21/2017 in [facility] Cardiac and Pulmonary Rehab  Date  02/27/17  Educator  [facility]  Instruction Review Code  1- Verbalizes Understanding      Anxiety: - Provides group, verbal and written instruction on the correlation between heart/lung disease and anxiety, treatment options, and management of anxiety.   Relaxation: - Provides group, verbal and written instruction about the benefits of relaxation for patients with heart/lung disease. Also provides patients with examples of relaxation techniques.   Pulmonary Rehab from 04/21/2017 in [facility] Cardiac and Pulmonary Rehab  Date  03/22/17  Educator  [facility]  Instruction Review Code  1- Verbalizes Understanding      Cardiac Medications: - Group verbal and written instruction to review commonly prescribed medications for heart disease. Reviews the  medication, class of the drug, and side effects.   Know Your Numbers: -Group verbal and written instruction about important numbers in your health.  Review of Cholesterol, Blood Pressure, Diabetes, and BMI and the role they play in your overall health.   Pulmonary Rehab from 04/21/2017 in [facility] Cardiac and Pulmonary Rehab  Date  03/17/17  Educator  [facility]  Instruction Review Code  1- Verbalizes Understanding      Other: -Provides group and verbal instruction on various topics (see comments)    Knowledge Questionnaire Score: Knowledge Questionnaire Score - 02/27/17 1519      Knowledge Questionnaire Score   Pre Score  8/10        Core Components/Risk Factors/Patient Goals at Admission: Personal Goals and Risk Factors at Admission - 02/27/17 1526      Core Components/Risk Factors/Patient Goals on Admission    Weight Management  Weight  Maintenance;Yes    Intervention  Weight Management: Develop a combined nutrition and exercise program designed to reach desired caloric intake, while maintaining appropriate intake of nutrient and fiber, sodium and fats, and appropriate energy expenditure required for the weight goal.;Weight Management: Provide education and appropriate resources to help participant work on and attain dietary goals.;Weight Management/Obesity: Establish reasonable short term and long term weight goals.    Admit Weight  151 lb 8 oz (68.7 kg)    Goal Weight: Short Term  146 lb (66.2 kg)    Goal Weight: Long Term  150 lb (68 kg)    Expected Outcomes  Short Term: Continue to assess and modify interventions until short term weight is achieved;Long Term: Adherence to nutrition and physical activity/exercise program aimed toward attainment of established weight goal;Weight Maintenance: Understanding of the daily nutrition guidelines, which includes 25-35% calories from fat, 7% or less cal from saturated fats, less than 257m cholesterol, less than 1.5gm of sodium, & 5 or more servings of fruits and vegetables daily;Understanding recommendations for meals to include 15-35% energy as protein, 25-35% energy from fat, 35-60% energy from carbohydrates, less than 2050mof dietary cholesterol, 20-35 gm of total fiber daily;Understanding of distribution of calorie intake throughout the day with the consumption of 4-5 meals/snacks    Diabetes  Yes    Intervention  Provide education about signs/symptoms and action to take for hypo/hyperglycemia.;Provide education about proper nutrition, including hydration, and aerobic/resistive exercise prescription along with prescribed medications to achieve blood glucose in normal ranges: Fasting glucose 65-99 mg/dL    Expected Outcomes  Short Term: Participant verbalizes understanding of the signs/symptoms and immediate care of hyper/hypoglycemia, proper foot care and importance of medication,  aerobic/resistive exercise and nutrition plan for blood glucose control.;Long Term: Attainment of HbA1C < 7%.    Hypertension  Yes    Intervention  Provide education on lifestyle modifcations including regular physical activity/exercise, weight management, moderate sodium restriction and increased consumption of fresh fruit, vegetables, and low fat dairy, alcohol moderation, and smoking cessation.;Monitor prescription use compliance.    Expected Outcomes  Short Term: Continued assessment and intervention until BP is < 140/9020mG in hypertensive participants. < 130/62m2m in hypertensive participants with diabetes, heart failure or chronic kidney disease.;Long Term: Maintenance of blood pressure at goal levels.    Stress  Yes    Intervention  Offer individual and/or small group education and counseling on adjustment to heart disease, stress management and health-related lifestyle change. Teach and support self-help strategies.;Refer participants experiencing significant psychosocial distress to appropriate mental health specialists for further evaluation and treatment. When possible,  include family members and significant others in education/counseling sessions.    Expected Outcomes  Short Term: Participant demonstrates changes in health-related behavior, relaxation and other stress management skills, ability to obtain effective social support, and compliance with psychotropic medications if prescribed.;Long Term: Emotional wellbeing is indicated by absence of clinically significant psychosocial distress or social isolation.       Core Components/Risk Factors/Patient Goals Review:  Goals and Risk Factor Review    Row Name 03/24/17 1358 04/12/17 1226           Core Components/Risk Factors/Patient Goals Review   Personal Goals Review  Weight Management/Obesity;Improve shortness of breath with ADL's;Diabetes;Stress;Hypertension  Weight Management/Obesity;Stress;Diabetes;Hypertension      Review   Selyna has had alot of stress dealing with her home situation. She states her husband and her do not get along and she would rather not be living with him. She states her diabetes has been under control and she checks her levels at home.  Her ADL's have not  improved much since the start of the program .   Yelitza is happy with her weight of 156, she wishes to maintain in the 150s-160s. She states her blood pressure has been doing really well since starting this program, even with the continual stress related to her home life. She has reported a slight increase in energy while doing her ADL's and cooking, which she is very proud of herself for! Her stress has not gotten any better and she does not see that changing anytime soon.      Expected Outcomes  Short: attend regularly to decrease stress. Long: Maintain a healthy stress free home.  Short: attend [facility] regularly to help improve ADL stamina, continue to maintain weight, decrease blood pressure, improve A1C, and help decrease stress. Long: continue to practice self care habits         Core Components/Risk Factors/Patient Goals at Discharge (Final Review):  Goals and Risk Factor Review - 04/12/17 1226      Core Components/Risk Factors/Patient Goals Review   Personal Goals Review  Weight Management/Obesity;Stress;Diabetes;Hypertension    Review  Shloka is happy with her weight of 156, she wishes to maintain in the 150s-160s. She states her blood pressure has been doing really well since starting this program, even with the continual stress related to her home life. She has reported a slight increase in energy while doing her ADL's and cooking, which she is very proud of herself for! Her stress has not gotten any better and she does not see that changing anytime soon.    Expected Outcomes  Short: attend [facility] regularly to help improve ADL stamina, continue to maintain weight, decrease blood pressure, improve A1C, and help decrease stress. Long:  continue to practice self care habits       ITP Comments: ITP Comments    Row Name 02/27/17 1626 03/06/17 1240 03/08/17 1312 03/27/17 0834 04/24/17 0851   ITP Comments  Medical evaluation completed. Chart sent to Dr. Emily Filbert director of [place] for signature and review.  Spent 15 minutes talking with Cheray about PLB.  Keiosha Majesty Stehlin did not complete her rehab session. Her pre exercise CBG was 72, crackers given. CBG went down to 61, gel given. Her CBG went up to 82. Patient given education on importance of protein and checking her sugar regularly. She was instructed to eat lunch right when she gets home.   30 day review completed. ITP sent to Dr. Emily Filbert Director of [place]. Continue with ITP  unless changes are made by physician.    30 day review completed. ITP sent to Dr. Emily Filbert Director of [place]. Continue with ITP unless changes are made by physician.        Comments: 30 day review

## 2017-04-24 NOTE — Telephone Encounter (Signed)
Jamie Murray called to say she would not be attending [facility] today or Wednesday due to going out of town for Thanksgiving.

## 2017-05-01 DIAGNOSIS — I272 Pulmonary hypertension, unspecified: Secondary | ICD-10-CM | POA: Diagnosis not present

## 2017-05-01 NOTE — Progress Notes (Signed)
Daily Session Note  Patient Details  Name: Jamie Murray MRN: [id number] Date of Birth: 1945-12-12 Referring Provider:     Pulmonary Rehab from 02/27/2017 in [facility] Cardiac and Pulmonary Rehab  Referring Provider  Trellis Moment MD      Encounter Date: 05/01/2017  Check In: Session Check In - 05/01/17 1139      Check-In   Location  [facility]-Cardiac & Pulmonary Rehab    Staff Present  Earlean Shawl, BS, ACSM CEP, Exercise Physiologist;Amanda Oletta Darter, BA, ACSM CEP, Exercise Physiologist;Kasara Schomer Flavia Shipper    Supervising physician immediately available to respond to emergencies  [facility] immediately available ER MD    Physician(s)  Dr. Quentin Cornwall and Corky Downs    Medication changes reported      No    Fall or balance concerns reported     No    Warm-up and Cool-down  Performed as group-led instruction    Resistance Training Performed  Yes    VAD Patient?  No      Pain Assessment   Currently in Pain?  No/denies          Social History   Tobacco Use  Smoking Status Never Smoker  Smokeless Tobacco Never Used    Goals Met:  Independence with exercise equipment Exercise tolerated well No report of cardiac concerns or symptoms Strength training completed today  Goals Unmet:  Not Applicable  Comments: Pt able to follow exercise prescription today without complaint.  Will continue to monitor for progression.   Dr. Emily Filbert is Medical Director for [place] and [facility].

## 2017-05-02 ENCOUNTER — Ambulatory Visit: Payer: Medicare Other | Admitting: Internal Medicine

## 2017-05-02 ENCOUNTER — Encounter: Payer: Self-pay | Admitting: Internal Medicine

## 2017-05-02 VITALS — BP 140/86 | HR 101 | Ht 67.5 in

## 2017-05-02 DIAGNOSIS — Z9989 Dependence on other enabling machines and devices: Secondary | ICD-10-CM | POA: Diagnosis not present

## 2017-05-02 DIAGNOSIS — G4733 Obstructive sleep apnea (adult) (pediatric): Secondary | ICD-10-CM

## 2017-05-02 DIAGNOSIS — J4520 Mild intermittent asthma, uncomplicated: Secondary | ICD-10-CM

## 2017-05-02 NOTE — Progress Notes (Signed)
Name: Jamie Murray MRN: [id number] DOB: 1946/03/22    CONSULTATION DATE: 05/02/2017   REFERRING MD : Julian Hy NP  CHIEF COMPLAINT: follow up Pulmonary HTN/COPD/OSA  Previous medical history Patient did not have MI but was told to see cardiologist as outpatient-patient underwent cardiac work up and lab testing Patient obtained ECHO and was noted in report to have severe pulmonary HTN  Patient has been having SOB and DOE for several months with LE swelling She also feels very tired all the time She sees DR [facility] for 10 years She was recently DX with OSA on CPAP therapy 10 cm h20 AHI is 0.9 with current therapy  She has been having lower ext swelling for several months She has extensive second hand smoking history for 26 years    History of present illness Patient here for follow-up  Patient feels 90% better since last OV She feels well She has no SOB, no CHF exacerbation No signs of infection  FEV1 FEC ratio of 86% predicted Has FEV1 FEV1 of 1.25 L at 50% predicted her FVC is 1.46 L which is 46% predicted her TLC is 78% predicted DLCO is 64% however it is 113% predicted when uncorrected for volumes Interpretation: moderate restrictive lung disease with a positive bronchodilator response findings to suggest underlying reactive airways disease and small airways obstructive disease with bronchodilator response Flow volume loops suggest scooping of the expiratory limb Findings suggest underlying mild COPD Patient states that she had a nebulizer treatment and that has significantly improved her shortness of breath Her PCP obtained a CT abdomen which shows ascites and pleural effusion and etiology is still unknown Patient probably has pleural effusions does not  any type of invasive procedure at this time     REVIEW OF SYSTEMS:   Constitutional: Negative for fever, chills, weight loss, +malaise/fatigue   HENT: Negative for hearing loss, ear pain, nosebleeds,  congestion, sore throat, neck pain, tinnitus and ear discharge.   Eyes: Negative for blurred vision, double vision, photophobia, pain, discharge and redness.  Respiratory: -hemoptysis, -sputum production, -shortness of breath, -wheezing and- stridor.   Cardiovascular: Negative for chest pain, palpitations, orthopnea, claudication, -leg swelling and -PND.  Gastrointestinal: Negative for heartburn, nausea, vomiting, abdominal pain, diarrhea, constipation, blood in stool and melena.     ALL OTHER ROS ARE NEGATIVE   Ht 5' 7.5" (1.715 m)   LMP  (LMP Unknown)   BMI 23.38 kg/m  BP 140/86 (BP Location: Left Arm, Cuff Size: Normal)   Pulse (!) 101   Ht 5' 7.5" (1.715 m)   LMP  (LMP Unknown)   SpO2 (!) 89%   BMI 23.38 kg/m    Physical Examination:   GENERAL:NAD, no fevers, chills, + fatigue-weakness HEAD: Normocephalic, atraumatic.  EYES: Pupils equal, round, reactive to light. Extraocular muscles intact. No scleral icterus.  MOUTH: Moist mucosal membrane.   EAR, NOSE, THROAT: Clear without exudates. No external lesions.  NECK: Supple. No thyromegaly. No nodules. No JVD.  PULMONARY:CTA B/L no wheezes, no crackles, no rhonchi CARDIOVASCULAR: S1 and S2. Regular rate and rhythm. No murmurs, rubs, or gallops. No edema.  GASTROINTESTINAL: Soft, nontender, nondistended. No masses. Positive bowel sounds.  PSYCHIATRIC: Mood, affect within normal limits. The patient is awake, alert and oriented x 3. Insight, judgment intact.     I have Independently reviewed images of CXR on 08/09/2016     Interpretation:No infiltrate or pulmonary edema   ASSESSMENT / PLAN:  71 yo AAF with resolved SOB and DOE  Patient previously has had lower ext swelling and edema with signs of cor pulmonale with elevated pulm artery pressures on ECHO with dx of underlying OSA with extensive second hand smoke exposure  At this time I believe her elevated PA pressures on echocardiogram are  related to her underlying  intermittent reactive airways disease with small airways obstructive disease along with her diagnosis of obstructive sleep apnea which correlates with secondary pulmonary hypertension related to other underlying disorders. In the setting of a normal 6 minute walk test patient does not have exertional hypoxia  She states that she feels well at this time but still has some fatigue Her CPAP compliance reports show 100% compliance, 4HR 90% compliance with AHI 0.3   1.SOB/DOE-resolved Her previous complaints of SOB were from Mild COPD , diastolic dysfunction  2.OSA on CPAP -continue CPAP therapy of 10 cm h20 with AHI 0.9 Patient is greatly benefiting and using her CPAP nightly   3.Extensive smoking exposure mild small airways obstructive disease -Avoid secondhand smoke -DuoNeb's at night -ALburterol in AM -no indication for inhaled steroids at this time  4.elevated Pulm art pressures, cor pulmonale with effusions After further assessment I do believe her PA pressures are related to underlying sleep apnea as well as small airways obstructive disease Will defer cardiology assessment at this time and will wait for repeat an echocardiogram reports to assess PA pressures  Continue Lasix as prescribed  5. ascites with effusions-resolved  6.deconditioned state Continue rehab program   7.Fatigue-her persistent fatigue is NOT related  To her underlying Mild COPD Her OSA is well under control at this time Patient has anemia and thyroid dysfunction that needs to be addressed Patient will also need Vit D Levels to be checked and to follow up with her PCP   Follow-up in 3 months    Patient  satisfied with Plan of action and management. All questions answered  Corrin Parker, M.D.  [facility]  Medical Director [place] Director [facility] Cardio-Pulmonary Department

## 2017-05-02 NOTE — Patient Instructions (Signed)
Continue inhalers as prescribed continue CPAP as prescribed

## 2017-05-03 ENCOUNTER — Encounter: Payer: Medicare Other | Admitting: *Deleted

## 2017-05-03 DIAGNOSIS — I272 Pulmonary hypertension, unspecified: Secondary | ICD-10-CM | POA: Diagnosis not present

## 2017-05-03 NOTE — Progress Notes (Signed)
Daily Session Note  Patient Details  Name: Jamie Murray MRN: [id number] Date of Birth: May 14, 1946 Referring Provider:     Pulmonary Rehab from 02/27/2017 in [facility] Cardiac and Pulmonary Rehab  Referring Provider  Trellis Moment MD      Encounter Date: 05/03/2017  Check In: Session Check In - 05/03/17 1124      Check-In   Location  [facility]-Cardiac & Pulmonary Rehab    Staff Present  Renita Papa, RN BSN;Joseph Darrin Nipper, [place], ACSM RCEP, Exercise Physiologist    Supervising physician immediately available to respond to emergencies  [facility] immediately available ER MD    Physician(s)  Dr. Mable Paris and [facility]    Medication changes reported      No    Fall or balance concerns reported     No    Warm-up and Cool-down  Performed as group-led instruction    Resistance Training Performed  Yes    VAD Patient?  No      Pain Assessment   Currently in Pain?  No/denies          Social History   Tobacco Use  Smoking Status Never Smoker  Smokeless Tobacco Never Used    Goals Met:  Proper associated with RPD/PD & O2 Sat Independence with exercise equipment Using PLB without cueing & demonstrates good technique Exercise tolerated well Personal goals reviewed Strength training completed today  Goals Unmet:  Not Applicable  Comments: Pt able to follow exercise prescription today without complaint.  Will continue to monitor for progression.    Dr. Emily Filbert is Medical Director for [place] and [facility].

## 2017-05-08 ENCOUNTER — Encounter: Payer: Medicare Other | Attending: Internal Medicine

## 2017-05-08 DIAGNOSIS — I272 Pulmonary hypertension, unspecified: Secondary | ICD-10-CM

## 2017-05-08 NOTE — Progress Notes (Signed)
Daily Session Note  Patient Details  Name: Jamie Murray MRN: [id number] Date of Birth: 16-Dec-1945 Referring Provider:     Pulmonary Rehab from 02/27/2017 in [facility] Cardiac and Pulmonary Rehab  Referring Provider  Trellis Moment MD      Encounter Date: 05/08/2017  Check In: Session Check In - 05/08/17 1122      Check-In   Location  [facility]-Cardiac & Pulmonary Rehab    Staff Present  Nada Maclachlan, BA, ACSM CEP, Exercise Physiologist;Kelly Amedeo Plenty, BS, ACSM CEP, Exercise Physiologist;Jeffifer Rabold Flavia Shipper    Supervising physician immediately available to respond to emergencies  [facility] immediately available ER MD    Physician(s)   Dr. Corky Downs and Jimmye Norman    Medication changes reported      No    Fall or balance concerns reported     No    Warm-up and Cool-down  Performed as group-led instruction    Resistance Training Performed  Yes    VAD Patient?  No      Pain Assessment   Currently in Pain?  No/denies          Social History   Tobacco Use  Smoking Status Never Smoker  Smokeless Tobacco Never Used    Goals Met:  Independence with exercise equipment Exercise tolerated well No report of cardiac concerns or symptoms Strength training completed today  Goals Unmet:  Not Applicable  Comments: Pt able to follow exercise prescription today without complaint.  Will continue to monitor for progression.   Dr. Emily Filbert is Medical Director for [place] and [facility].

## 2017-05-10 ENCOUNTER — Encounter: Payer: Medicare Other | Admitting: *Deleted

## 2017-05-10 DIAGNOSIS — I272 Pulmonary hypertension, unspecified: Secondary | ICD-10-CM

## 2017-05-10 NOTE — Progress Notes (Signed)
Daily Session Note  Patient Details  Name: Jamie Murray MRN: [id number] Date of Birth: May 10, 1946 Referring Provider:     Pulmonary Rehab from 02/27/2017 in [facility] Cardiac and Pulmonary Rehab  Referring Provider  Trellis Moment MD      Encounter Date: 05/10/2017  Check In: Session Check In - 05/10/17 1123      Check-In   Location  [facility]-Cardiac & Pulmonary Rehab    Staff Present  Renita Papa, RN BSN;Joseph Darrin Nipper, [place], ACSM RCEP, Exercise Physiologist    Supervising physician immediately available to respond to emergencies  [facility] immediately available ER MD    Physician(s)  Dr. Joni Fears and Archie Balboa    Medication changes reported      No    Fall or balance concerns reported     No    Warm-up and Cool-down  Performed as group-led instruction    Resistance Training Performed  Yes    VAD Patient?  No      Pain Assessment   Currently in Pain?  No/denies        Exercise Prescription Changes - 05/09/17 1600      Response to Exercise   Blood Pressure (Admit)  130/70    Blood Pressure (Exit)  126/68    Heart Rate (Admit)  70 bpm    Heart Rate (Exercise)  102 bpm    Heart Rate (Exit)  70 bpm    Oxygen Saturation (Admit)  90 %    Oxygen Saturation (Exercise)  100 %    Oxygen Saturation (Exit)  99 %    Rating of Perceived Exertion (Exercise)  13    Perceived Dyspnea (Exercise)  0    Symptoms  none    Duration  Continue with 45 min of aerobic exercise without signs/symptoms of physical distress.    Intensity  THRR unchanged      Progression   Progression  Continue to progress workloads to maintain intensity without signs/symptoms of physical distress.    Average METs  2.79      Resistance Training   Training Prescription  Yes    Weight  3 lbs    Reps  10-15      Interval Training   Interval Training  No      Treadmill   MPH  1.5    Grade  1.5    Minutes  15    METs  2.46      NuStep   Level  6    SPM  72    Minutes  15    METs  3      REL-XR   Level  2    Speed  49    Minutes  15    METs  3      Home Exercise Plan   Plans to continue exercise at  [employer] (comment) [employer]    Frequency  Add 2 additional days to program exercise sessions.    Initial Home Exercises Provided  03/15/17       Social History   Tobacco Use  Smoking Status Never Smoker  Smokeless Tobacco Never Used    Goals Met:  Proper associated with RPD/PD & O2 Sat Independence with exercise equipment Using PLB without cueing & demonstrates good technique Exercise tolerated well Strength training completed today  Goals Unmet:  Not Applicable  Comments: Pt able to follow exercise prescription today without complaint.  Will continue to monitor for progression.    Dr. Emily Filbert  is Market researcher for [place] and [facility].

## 2017-05-12 DIAGNOSIS — I272 Pulmonary hypertension, unspecified: Secondary | ICD-10-CM | POA: Diagnosis not present

## 2017-05-12 NOTE — Progress Notes (Signed)
Daily Session Note  Patient Details  Name: Jamie Murray MRN: [id number] Date of Birth: July 23, 1945 Referring Provider:     Pulmonary Rehab from 02/27/2017 in [facility] Cardiac and Pulmonary Rehab  Referring Provider  Trellis Moment MD      Encounter Date: 05/12/2017  Check In: Session Check In - 05/12/17 1125      Check-In   Location  [facility]-Cardiac & Pulmonary Rehab    Staff Present  Justin Mend RCP,RRT,BSRT;Krista Frederico Hamman, RN BSN;Eliezer Khawaja Luan Pulling, MA, ACSM RCEP, Exercise Physiologist    Supervising physician immediately available to respond to emergencies  [facility] immediately available ER MD    Physician(s)  Drs. Lord and [employer]    Medication changes reported      No    Fall or balance concerns reported     No    Warm-up and Cool-down  Performed as group-led Higher education careers adviser Performed  Yes    VAD Patient?  No      Pain Assessment   Currently in Pain?  No/denies          Social History   Tobacco Use  Smoking Status Never Smoker  Smokeless Tobacco Never Used    Goals Met:  Proper associated with RPD/PD & O2 Sat Independence with exercise equipment Using PLB without cueing & demonstrates good technique Exercise tolerated well No report of cardiac concerns or symptoms Strength training completed today  Goals Unmet:  Not Applicable  Comments: Pt able to follow exercise prescription today without complaint.  Will continue to monitor for progression.    Dr. Emily Filbert is Medical Director for [place] and [facility].

## 2017-05-22 DIAGNOSIS — I272 Pulmonary hypertension, unspecified: Secondary | ICD-10-CM

## 2017-05-22 NOTE — Progress Notes (Signed)
Pulmonary Individual Treatment Plan  Patient Details  Name: Jamie Murray MRN: [id number] Date of Birth: 04/09/1946 Referring Provider:     Pulmonary Rehab from 02/27/2017 in [facility] Cardiac and Pulmonary Rehab  Referring Provider  Trellis Moment MD      Initial Encounter Date:    Pulmonary Rehab from 02/27/2017 in [facility] Cardiac and Pulmonary Rehab  Date  02/27/17  Referring Provider  Trellis Moment MD      Visit Diagnosis: Pulmonary hypertension ([place])  Patient's Home Medications on Admission:  Current Outpatient Medications:  .  aspirin 81 MG tablet, Take 81 mg by mouth daily., Disp: , Rfl:  .  atorvastatin (LIPITOR) 10 MG tablet, Take 1 tablet (10 mg total) by mouth daily., Disp: 90 tablet, Rfl: 3 .  Blood Glucose Monitoring Suppl (GNP EASY TOUCH GLUCOSE METER) DEVI, See admin instructions., Disp: , Rfl: 0 .  buPROPion (WELLBUTRIN SR) 150 MG 12 hr tablet, Take 1 tablet (150 mg total) by mouth 2 (two) times daily. Take 1 tablet in the morning for 2 weeks then take twice and day., Disp: 180 tablet, Rfl: 1 .  Cholecalciferol (VITAMIN D3) 2000 units TABS, Take 2,000 Units by mouth daily., Disp: , Rfl:  .  clonazePAM (KLONOPIN) 1 MG tablet, Take 1 mg by mouth at bedtime., Disp: , Rfl: 0 .  clopidogrel (PLAVIX) 75 MG tablet, Take 1 tablet (75 mg total) by mouth daily., Disp: 90 tablet, Rfl: 3 .  Dulaglutide (TRULICITY) 1.5 GB/1.5VV SOPN, Inject 1.5 mg into the skin once a week., Disp: 12 pen, Rfl: 3 .  EASY TOUCH TEST test strip, USE TO CHECK BLOOD SUGAR ONCE DAILY, Disp: , Rfl: 0 .  furosemide (LASIX) 20 MG tablet, Take 20 mg by mouth daily as needed., Disp: , Rfl: 2 .  glimepiride (AMARYL) 2 MG tablet, Take 2 mg by mouth daily., Disp: , Rfl:  .  hydrALAZINE (APRESOLINE) 50 MG tablet, Take 100 mg by mouth 2 (two) times daily., Disp: , Rfl: 1 .  ipratropium-albuterol (DUONEB) 0.5-2.5 (3) MG/3ML SOLN, Take 3 mLs by nebulization every 4 (four) hours as needed., Disp: 360 mL, Rfl: 12 .   LORazepam (ATIVAN) 0.5 MG tablet, Take 1 tablet (0.5 mg total) by mouth daily as needed., Disp: 30 tablet, Rfl: 1 .  losartan (COZAAR) 100 MG tablet, Take 100 mg by mouth daily., Disp: , Rfl: 6 .  metFORMIN (GLUCOPHAGE-XR) 500 MG 24 hr tablet, Take 4 tablets (2,000 mg total) by mouth daily with breakfast., Disp: 360 tablet, Rfl: 0 .  metoprolol succinate (TOPROL-XL) 100 MG 24 hr tablet, Take 100 mg by mouth daily. , Disp: , Rfl:  .  Multiple Vitamin (MULTIVITAMIN) tablet, Take 1 tablet by mouth daily., Disp: , Rfl:  .  pantoprazole (PROTONIX) 40 MG tablet, Take 1 tablet (40 mg total) by mouth 2 (two) times daily., Disp: 60 tablet, Rfl: 11 .  potassium chloride (KLOR-CON 10) 10 MEQ tablet, Take 1 tablet (10 mEq total) by mouth 2 (two) times daily., Disp: 60 tablet, Rfl: 0 .  PROAIR HFA 108 (90 Base) MCG/ACT inhaler, Inhale 2 puffs into the lungs every 4 (four) hours as needed for wheezing or shortness of breath., Disp: 8.5 g, Rfl: 2 .  spironolactone (ALDACTONE) 25 MG tablet, Take 25 mg by mouth daily., Disp: , Rfl: 6  Past Medical History: Past Medical History:  Diagnosis Date  . Allergy   . Anemia   . Anxiety   . Depression   . Diabetes ([place])   .  GERD (gastroesophageal reflux disease)   . Hyperlipidemia   . Hypertension   . Insomnia   . Migraines   . Osteoporosis   . [facility] spotted fever   . Transient cerebral ischemia   . Vertigo    every 2-3 months    Tobacco Use: Social History   Tobacco Use  Smoking Status Never Smoker  Smokeless Tobacco Never Used    Labs: Recent Review Flowsheet Data    Labs for ITP Cardiac and Pulmonary Rehab Latest Ref Rng & Units 04/18/2012 07/22/2015 03/07/2016 06/07/2016 11/29/2016   Cholestrol 100 - 199 mg/dL 185 139 145 213(H) 95(L)   LDLCALC 0 - 99 mg/dL 118(H) 63 77 143(H) 45   HDL >39 mg/dL 42 45 43 37(L) 29(L)   Trlycerides 0 - 149 mg/dL 126 156(H) 123 164(H) 105   Hemoglobin A1c - 6.5(H) - 7.8% - -       Pulmonary Assessment  Scores: Pulmonary Assessment Scores    Row Name 02/27/17 1515 05/01/17 1205       ADL UCSD   ADL Phase  Entry  Mid    SOB Score total  48  34    Rest  0  0    Walk  2  1    Stairs  1  3    Bath  3  2    Dress  0  2    Shop  3  1      CAT Score   CAT Score  14  -      mMRC Score   mMRC Score  4  -       Pulmonary Function Assessment: Pulmonary Function Assessment - 02/27/17 1525      Breath   Bilateral Breath Sounds  Clear    Shortness of Breath  No she feels tired not short of breath        Exercise Target Goals:    Exercise Program Goal: Individual exercise prescription set with THRR, safety & activity barriers. Participant demonstrates ability to understand and report RPE using BORG scale, to self-measure pulse accurately, and to acknowledge the importance of the exercise prescription.  Exercise Prescription Goal: Starting with aerobic activity 30 plus minutes a day, 3 days per week for initial exercise prescription. Provide home exercise prescription and guidelines that participant acknowledges understanding prior to discharge.  Activity Barriers & Risk Stratification: Activity Barriers & Cardiac Risk Stratification - 02/27/17 1640      Activity Barriers & Cardiac Risk Stratification   Activity Barriers  Deconditioning;Muscular Weakness;Shortness of Breath;Assistive Device       6 Minute Walk: 6 Minute Walk    Row Name 02/27/17 1637         6 Minute Walk   Phase  Initial     Distance  610 feet     Walk Time  6 minutes     # of Rest Breaks  0     MPH  1.15     METS  2.08     RPE  9     Perceived Dyspnea   0     VO2 Peak  7.28     Symptoms  No walking with cane and wearing slides     Resting HR  72 bpm     Resting BP  128/66     Resting Oxygen Saturation   99 %     Exercise Oxygen Saturation  during 6 min walk  90 %  Max Ex. HR  98 bpm     Max Ex. BP  152/80     2 Minute Post BP  134/74       Interval HR   1 Minute HR  85     2 Minute HR   86     3 Minute HR  87     4 Minute HR  96     5 Minute HR  92     6 Minute HR  98     2 Minute Post HR  76     Interval Heart Rate?  Yes       Interval Oxygen   Interval Oxygen?  Yes     Baseline Oxygen Saturation %  99 %     1 Minute Oxygen Saturation %  96 %     1 Minute Liters of Oxygen  0 L room air     2 Minute Oxygen Saturation %  93 %     2 Minute Liters of Oxygen  0 L     3 Minute Oxygen Saturation %  90 %     3 Minute Liters of Oxygen  0 L     4 Minute Oxygen Saturation %  97 %     4 Minute Liters of Oxygen  0 L     5 Minute Oxygen Saturation %  99 %     5 Minute Liters of Oxygen  0 L     6 Minute Oxygen Saturation %  91 %     6 Minute Liters of Oxygen  0 L     2 Minute Post Oxygen Saturation %  95 %     2 Minute Post Liters of Oxygen  0 L       Oxygen Initial Assessment: Oxygen Initial Assessment - 02/27/17 1520      Home Oxygen   Home Oxygen Device  None    Sleep Oxygen Prescription  CPAP    Liters per minute  0    Home Exercise Oxygen Prescription  None    Home at Rest Exercise Oxygen Prescription  None    Compliance with Home Oxygen Use  Yes      Initial 6 min Walk   Oxygen Used  None      Program Oxygen Prescription   Program Oxygen Prescription  None      Intervention   Short Term Goals  To learn and understand importance of monitoring SPO2 with pulse oximeter and demonstrate accurate use of the pulse oximeter.;To learn and demonstrate proper pursed lip breathing techniques or other breathing techniques.;To learn and understand importance of maintaining oxygen saturations>88%;To learn and demonstrate proper use of respiratory medications    Long  Term Goals  Maintenance of O2 saturations>88%;Compliance with respiratory medication;Demonstrates proper use of MDI's;Exhibits proper breathing techniques, such as pursed lip breathing or other method taught during program session;Verbalizes importance of monitoring SPO2 with pulse oximeter and return  demonstration       Oxygen Re-Evaluation: Oxygen Re-Evaluation    Row Name 03/06/17 1240 03/24/17 1402 04/10/17 1606 05/03/17 1231       Program Oxygen Prescription   Program Oxygen Prescription  None  None  None  None      Home Oxygen   Home Oxygen Device  None  None  None  None    Sleep Oxygen Prescription  CPAP  CPAP  CPAP  CPAP    Liters per minute  0  0  0  0    Home Exercise Oxygen Prescription  None  None  None  None    Home at Rest Exercise Oxygen Prescription  None  None  None  None    Compliance with Home Oxygen Use  Yes CPAP  Yes CPAP  Yes CPAP  Yes      Goals/Expected Outcomes   Short Term Goals  To learn and understand importance of maintaining oxygen saturations>88%;To learn and demonstrate proper use of respiratory medications;To learn and demonstrate proper pursed lip breathing techniques or other breathing techniques.;To learn and understand importance of monitoring SPO2 with pulse oximeter and demonstrate accurate use of the pulse oximeter.  To learn and understand importance of maintaining oxygen saturations>88%;To learn and exhibit compliance with exercise, home and travel O2 prescription;To learn and understand importance of monitoring SPO2 with pulse oximeter and demonstrate accurate use of the pulse oximeter.;To learn and demonstrate proper pursed lip breathing techniques or other breathing techniques.;To learn and demonstrate proper use of respiratory medications  To learn and understand importance of maintaining oxygen saturations>88%;To learn and demonstrate proper use of respiratory medications;To learn and exhibit compliance with exercise, home and travel O2 prescription;To learn and demonstrate proper pursed lip breathing techniques or other breathing techniques.;To learn and understand importance of monitoring SPO2 with pulse oximeter and demonstrate accurate use of the pulse oximeter.  To learn and exhibit compliance with exercise, home and travel O2  prescription;To learn and understand importance of monitoring SPO2 with pulse oximeter and demonstrate accurate use of the pulse oximeter.;To learn and understand importance of maintaining oxygen saturations>88%;To learn and demonstrate proper pursed lip breathing techniques or other breathing techniques.;To learn and demonstrate proper use of respiratory medications    Long  Term Goals  Verbalizes importance of monitoring SPO2 with pulse oximeter and return demonstration;Exhibits proper breathing techniques, such as pursed lip breathing or other method taught during program session;Compliance with respiratory medication;Maintenance of O2 saturations>88%  Maintenance of O2 saturations>88%;Compliance with respiratory medication;Exhibits compliance with exercise, home and travel O2 prescription;Verbalizes importance of monitoring SPO2 with pulse oximeter and return demonstration;Exhibits proper breathing techniques, such as pursed lip breathing or other method taught during program session  Exhibits compliance with exercise, home and travel O2 prescription;Maintenance of O2 saturations>88%;Verbalizes importance of monitoring SPO2 with pulse oximeter and return demonstration;Compliance with respiratory medication;Exhibits proper breathing techniques, such as pursed lip breathing or other method taught during program session  Exhibits compliance with exercise, home and travel O2 prescription;Verbalizes importance of monitoring SPO2 with pulse oximeter and return demonstration;Compliance with respiratory medication;Exhibits proper breathing techniques, such as pursed lip breathing or other method taught during program session    Comments  Lashanna is using her CPAP everynight as instructed. Explained to her that PLB can be used to help catch her breath, also that she can purchase a pulse oximeter to moniter her oxygen at home. Explained to her that her oxygen should be 88 percent and above.   Pegggs does not have a  pulse oximeter at home to check her own oxygen. She states she has been taking her nebulizer and her inhalers as prescribed. She needs to work on PLB techniques while she is in class.   Ifeoluwa states that she takes her nebulizer once a day. She takes her albuterol inhaler PRN and has not used it lately. She does not wear home oxygen but is on a CPAP at night. She states she wears her CPAP every night. Her oxygen has not fallen below 88 percent on  any day in [facility].   Janaysia is using her respiratory medications properly. She is using her CPAP at night, except when she has sinus issues. She checks her oxygen regularly and is pleased with the improvement.     Goals/Expected Outcomes  Short: Work on PLB. Obtain a pulse oximeter. Long Be proficient in checking oxygen saturation and PLB  Short: Be more proficient with PLB. Long: Be independent with PLB.  Short; obtain a pulse oximeter. Long: check oxygen saturation at home independently.  Short: wear CPAP every night; Long: continue to check oxygen and wear CPAP       Oxygen Discharge (Final Oxygen Re-Evaluation): Oxygen Re-Evaluation - 05/03/17 1231      Program Oxygen Prescription   Program Oxygen Prescription  None      Home Oxygen   Home Oxygen Device  None    Sleep Oxygen Prescription  CPAP    Liters per minute  0    Home Exercise Oxygen Prescription  None    Home at Rest Exercise Oxygen Prescription  None    Compliance with Home Oxygen Use  Yes      Goals/Expected Outcomes   Short Term Goals  To learn and exhibit compliance with exercise, home and travel O2 prescription;To learn and understand importance of monitoring SPO2 with pulse oximeter and demonstrate accurate use of the pulse oximeter.;To learn and understand importance of maintaining oxygen saturations>88%;To learn and demonstrate proper pursed lip breathing techniques or other breathing techniques.;To learn and demonstrate proper use of respiratory medications    Long  Term Goals   Exhibits compliance with exercise, home and travel O2 prescription;Verbalizes importance of monitoring SPO2 with pulse oximeter and return demonstration;Compliance with respiratory medication;Exhibits proper breathing techniques, such as pursed lip breathing or other method taught during program session    Comments  Rhiley is using her respiratory medications properly. She is using her CPAP at night, except when she has sinus issues. She checks her oxygen regularly and is pleased with the improvement.     Goals/Expected Outcomes  Short: wear CPAP every night; Long: continue to check oxygen and wear CPAP       Initial Exercise Prescription: Initial Exercise Prescription - 02/27/17 1600      Date of Initial Exercise RX and Referring Provider   Date  02/27/17    Referring Provider  Trellis Moment MD      Treadmill   MPH  1    Grade  0.5    Minutes  15    METs  1.83      NuStep   Level  1    SPM  80    Minutes  15    METs  2      REL-XR   Level  1    Speed  50    Minutes  15    METs  2      Prescription Details   Frequency (times per week)  3    Duration  Progress to 45 minutes of aerobic exercise without signs/symptoms of physical distress      Intensity   THRR 40-80% of Max Heartrate  103-134    Ratings of Perceived Exertion  11-13    Perceived Dyspnea  0-4      Progression   Progression  Continue to progress workloads to maintain intensity without signs/symptoms of physical distress.      Resistance Training   Training Prescription  Yes    Weight  3 lbs  Perform Capillary Blood Glucose checks as needed.  Exercise Prescription Changes: Exercise Prescription Changes    Row Name 02/27/17 1600 03/14/17 1300 03/15/17 1300 03/29/17 1400 04/11/17 1600     Response to Exercise   Blood Pressure (Admit)  128/66  132/78  -  118/62  114/64   Blood Pressure (Exercise)  152/80  138/76  -  -  -   Blood Pressure (Exit)  134/74  128/64  -  128/70  128/80   Heart Rate  (Admit)  72 bpm  78 bpm  -  72 bpm  70 bpm   Heart Rate (Exercise)  98 bpm  86 bpm  -  96 bpm  95 bpm   Heart Rate (Exit)  76 bpm  70 bpm  -  76 bpm  85 bpm   Oxygen Saturation (Admit)  99 %  95 %  -  96 %  95 %   Oxygen Saturation (Exercise)  90 %  96 %  -  94 %  97 %   Oxygen Saturation (Exit)  95 %  94 %  -  93 %  97 %   Rating of Perceived Exertion (Exercise)  9  11  -  13  13   Perceived Dyspnea (Exercise)  0  0  -  0  0   Symptoms  none  none  -  none  none   Comments  walk test results, wearing slides, using cane  second day full day of exercise  -  -  -   Duration  -  Progress to 45 minutes of aerobic exercise without signs/symptoms of physical distress  -  Continue with 45 min of aerobic exercise without signs/symptoms of physical distress.  Continue with 45 min of aerobic exercise without signs/symptoms of physical distress.   Intensity  -  THRR unchanged  -  THRR unchanged  THRR unchanged     Progression   Progression  -  Continue to progress workloads to maintain intensity without signs/symptoms of physical distress.  -  Continue to progress workloads to maintain intensity without signs/symptoms of physical distress.  Continue to progress workloads to maintain intensity without signs/symptoms of physical distress.   Average METs  -  1.87  -  2.57  2.55     Resistance Training   Training Prescription  -  Yes  -  Yes  Yes   Weight  -  3 lbs  -  3 lbs  3 lbs   Reps  -  10-15  -  10-15  10-15     Interval Training   Interval Training  -  No  -  No  No     Treadmill   MPH  -  0  -  1.5  1.5   Grade  -  0.5  -  1.5  1.5   Minutes  -  15  -  15  15   METs  -  1.83  -  2.46  2.46     NuStep   Level  -  1  -  2  5   SPM  -  60  -  70  81   Minutes  -  15  -  15  15   METs  -  1.7  -  2.9  2.2     REL-XR   Level  -  1  -  1  1   Speed  -  40  -  44  42   Minutes  -  15  -  15  15   METs  -  2.1  -  2.4  3     Home Exercise Plan   Plans to continue exercise at  -  -   [employer] [facility] [employer] (comment) [employer] (comment) [employer] (comment) [employer]   Frequency  -  -  Add 1 additional day to program exercise sessions.  Add 1 additional day to program exercise sessions.  Add 1 additional day to program exercise sessions.   Initial Home Exercises Provided  -  -  03/15/17  03/15/17  03/15/17   Row Name 04/25/17 1400 05/09/17 1600           Response to Exercise   Blood Pressure (Admit)  124/64  130/70      Blood Pressure (Exit)  126/74  126/68      Heart Rate (Admit)  72 bpm  70 bpm      Heart Rate (Exercise)  106 bpm  102 bpm      Heart Rate (Exit)  70 bpm  70 bpm      Oxygen Saturation (Admit)  98 %  90 %      Oxygen Saturation (Exercise)  94 %  100 %      Oxygen Saturation (Exit)  97 %  99 %      Rating of Perceived Exertion (Exercise)  13  13      Perceived Dyspnea (Exercise)  3  0      Symptoms  SOB  none      Duration  Continue with 45 min of aerobic exercise without signs/symptoms of physical distress.  Continue with 45 min of aerobic exercise without signs/symptoms of physical distress.      Intensity  THRR unchanged  THRR unchanged        Progression   Progression  Continue to progress workloads to maintain intensity without signs/symptoms of physical distress.  Continue to progress workloads to maintain intensity without signs/symptoms of physical distress.      Average METs  2.85  2.79        Resistance Training   Training Prescription  Yes  Yes      Weight  3 lbs  3 lbs      Reps  10-15  10-15        Interval Training   Interval Training  No  No        Treadmill   MPH  1.5  1.5      Grade  1.5  1.5      Minutes  15  15      METs  2.46  2.46        NuStep   Level  6  6      SPM  72  72      Minutes  15  15      METs  3  3        REL-XR   Level  2  2      Speed  48  49      Minutes  15  15      METs  3.1  3        Home Exercise Plan   Plans to continue exercise at   [employer] [facility] [employer] (comment) [employer] (comment) [employer]      Frequency  Add 1  additional day to program exercise sessions.  Add 2 additional days to program exercise sessions.      Initial Home Exercises Provided  03/15/17  03/15/17         Exercise Comments: Exercise Comments    Row Name 03/06/17 1157 05/10/17 1454         Exercise Comments  First full day of exercise!  Patient was oriented to gym and equipment including functions, settings, policies, and procedures.  Patient's individual exercise prescription and treatment plan were reviewed.  All starting workloads were established based on the results of the 6 minute walk test done at initial orientation visit.  The plan for exercise progression was also introduced and progression will be customized based on patient's performance and goals.  Louvenia would like to continue to work on the stepper or steps like we did today for weights.  She would like to get stronger on the steps and we will start making this part of her routine.          Exercise Goals and Review: Exercise Goals    Row Name 02/27/17 1643             Exercise Goals   Increase Physical Activity  Yes       Intervention  Provide advice, education, support and counseling about physical activity/exercise needs.;Develop an individualized exercise prescription for aerobic and resistive training based on initial evaluation findings, risk stratification, comorbidities and participant's personal goals.       Expected Outcomes  Achievement of increased cardiorespiratory fitness and enhanced flexibility, muscular endurance and strength shown through measurements of functional capacity and personal statement of participant.       Increase Strength and Stamina  Yes       Intervention  Provide advice, education, support and counseling about physical activity/exercise needs.;Develop an individualized exercise prescription for aerobic and resistive  training based on initial evaluation findings, risk stratification, comorbidities and participant's personal goals.       Expected Outcomes  Achievement of increased cardiorespiratory fitness and enhanced flexibility, muscular endurance and strength shown through measurements of functional capacity and personal statement of participant.       Able to understand and use rate of perceived exertion (RPE) scale  Yes       Intervention  Provide education and explanation on how to use RPE scale       Expected Outcomes  Short Term: Able to use RPE daily in rehab to express subjective intensity level;Long Term:  Able to use RPE to guide intensity level when exercising independently       Able to understand and use Dyspnea scale  Yes       Intervention  Provide education and explanation on how to use Dyspnea scale       Expected Outcomes  Short Term: Able to use Dyspnea scale daily in rehab to express subjective sense of shortness of breath during exertion;Long Term: Able to use Dyspnea scale to guide intensity level when exercising independently       Knowledge and understanding of Target Heart Rate Range (THRR)  Yes       Intervention  Provide education and explanation of THRR including how the numbers were predicted and where they are located for reference       Expected Outcomes  Short Term: Able to state/look up THRR;Long Term: Able to use THRR to govern intensity when exercising independently;Short Term: Able to use daily as guideline for intensity in rehab  Able to check pulse independently  Yes       Intervention  Provide education and demonstration on how to check pulse in carotid and radial arteries.;Review the importance of being able to check your own pulse for safety during independent exercise       Expected Outcomes  Short Term: Able to explain why pulse checking is important during independent exercise;Long Term: Able to check pulse independently and accurately       Understanding of  Exercise Prescription  Yes       Intervention  Provide education, explanation, and written materials on patient's individual exercise prescription       Expected Outcomes  Short Term: Able to explain program exercise prescription;Long Term: Able to explain home exercise prescription to exercise independently          Exercise Goals Re-Evaluation : Exercise Goals Re-Evaluation    Row Name 03/14/17 1323 03/15/17 1259 03/29/17 1442 04/11/17 1557 04/25/17 1414     Exercise Goal Re-Evaluation   Exercise Goals Review  Increase Physical Activity;Increase Strength and Stamina  Increase Physical Activity;Able to understand and use Dyspnea scale;Understanding of Exercise Prescription;Knowledge and understanding of Target Heart Rate Range (THRR);Increase Strength and Stamina;Able to understand and use rate of perceived exertion (RPE) scale;Able to check pulse independently  Increase Physical Activity;Increase Strength and Stamina  Increase Physical Activity;Increase Strength and Stamina;Understanding of Exercise Prescription  Increase Physical Activity;Increase Strength and Stamina;Understanding of Exercise Prescription   Comments  Melyna is off to a good start with rehab.  She has completed two full days of exercise.  She is now up to 5 min intervals on the treadmill.  We will continue to monitor her progress.  Reviewed home exercise with pt today.  Pt plans to walk and use the cross trainer at [employer] for exercise.  Reviewed THR, pulse, RPE, sign and symptoms, NTG use, and when to call 911 or MD.  Also discussed weather considerations and indoor options.  Pt voiced understanding  Trezure is off to a great start in rehab.  She is now up to 1.5 mph on the treadmill.  We will continue to monitor her progression.   Mendi has continued to do well in rehab.  She has finally cut down her nail to be able to monitor her oxygen saturation with probe.  She is now up to level 5 on the NuStep.  We will continue monitor  her progression.  Senia has been doing well in rehab.  She is now up to 1.11mh on the treadmill and level 6 on the NuStep.  She is really getting moving!!  We will continue to monitor her progress.    Expected Outcomes  Short: Continue to build up stamina on treadmill.  Long: Continue to increase physical activity.   Short: adding one extra day of exercise a week. Long: Maintain a workout routine independently post [facility].  Short: Increase workloads on XR and NuStep.  Long: Get into routine of regular exercise.   Short: Try to increase spms on both XR and NuStep.  Long: Continue to exercise on her own.   Short: Continue to try to increase workload on XR.  Long: Continue to exercise regularly.    ROrchard HomesName 05/09/17 1535             Exercise Goal Re-Evaluation   Exercise Goals Review  Increase Physical Activity;Increase Strength and Stamina;Understanding of Exercise Prescription       Comments  PFevenhas continued to do well in  rehab.  She gets excited when she is able to get her vitals herself now.  She has increased her rpms on the XR to 49!  We will continue to monitor her progression.        Expected Outcomes  Short: Continue to increase workloads.  Long: Continue to exercise regularly.           Discharge Exercise Prescription (Final Exercise Prescription Changes): Exercise Prescription Changes - 05/09/17 1600      Response to Exercise   Blood Pressure (Admit)  130/70    Blood Pressure (Exit)  126/68    Heart Rate (Admit)  70 bpm    Heart Rate (Exercise)  102 bpm    Heart Rate (Exit)  70 bpm    Oxygen Saturation (Admit)  90 %    Oxygen Saturation (Exercise)  100 %    Oxygen Saturation (Exit)  99 %    Rating of Perceived Exertion (Exercise)  13    Perceived Dyspnea (Exercise)  0    Symptoms  none    Duration  Continue with 45 min of aerobic exercise without signs/symptoms of physical distress.    Intensity  THRR unchanged      Progression   Progression  Continue to progress  workloads to maintain intensity without signs/symptoms of physical distress.    Average METs  2.79      Resistance Training   Training Prescription  Yes    Weight  3 lbs    Reps  10-15      Interval Training   Interval Training  No      Treadmill   MPH  1.5    Grade  1.5    Minutes  15    METs  2.46      NuStep   Level  6    SPM  72    Minutes  15    METs  3      REL-XR   Level  2    Speed  49    Minutes  15    METs  3      Home Exercise Plan   Plans to continue exercise at  [employer] (comment) [employer]    Frequency  Add 2 additional days to program exercise sessions.    Initial Home Exercises Provided  03/15/17       Nutrition:  Target Goals: Understanding of nutrition guidelines, daily intake of sodium <1543m, cholesterol <2063m calories 30% from fat and 7% or less from saturated fats, daily to have 5 or more servings of fruits and vegetables.  Biometrics: Pre Biometrics - 02/27/17 1643      Pre Biometrics   Height  5' 7.5" (1.715 m)    Weight  151 lb 8 oz (68.7 kg)    Waist Circumference  33 inches    Hip Circumference  36.75 inches    Waist to Hip Ratio  0.9 %    BMI (Calculated)  23.36        Nutrition Therapy Plan and Nutrition Goals: Nutrition Therapy & Goals - 04/10/17 1006      Nutrition Therapy   Diet  Instructed on a meal plan including dietary guidelines for lung health and diabetes    Drug/Food Interactions  Statins/Certain Fruits    Protein (specify units)  6    Fiber  20 grams    Whole Grain Foods  3 servings    Saturated Fats  12 max. grams    Fruits  and Vegetables  5 servings/day    Sodium  1500 grams      Personal Nutrition Goals   Nutrition Goal  Measure out some starchy foods. Count 1 cup serving as 2 servings of carbohydrate.    Personal Goal #2  Balance meals with protein, 2-3 servings of carbohydrate and non-starchy vegetables.    Personal Goal #3  Read labels for sodium.    Personal Goal #4  Limit sugar  sweetened beverages.      Intervention Plan   Intervention  Prescribe, educate and counsel regarding individualized specific dietary modifications aiming towards targeted core components such as weight, hypertension, lipid management, diabetes, heart failure and other comorbidities.;Nutrition handout(s) given to patient.    Expected Outcomes  Short Term Goal: Understand basic principles of dietary content, such as calories, fat, sodium, cholesterol and nutrients.;Short Term Goal: A plan has been developed with personal nutrition goals set during dietitian appointment.;Long Term Goal: Adherence to prescribed nutrition plan.       Nutrition Discharge: Rate Your Plate Scores: Nutrition Assessments - 02/27/17 1614      MEDFICTS Scores   Pre Score  65       Nutrition Goals Re-Evaluation: Nutrition Goals Re-Evaluation    Row Name 03/17/17 1247 05/03/17 1226           Goals   Current Weight  152 lb 12.8 oz (69.3 kg)  -      Nutrition Goal  Eat healthier and adhere to a diet plan  Adianna set a nutrition goal with the nutritionist where she learned about measuring food, reading labels, limiting sugar beverages and eating balanced meals. Her goal is to adhere to this plan      Comment  Kadasia has a nutrition appointment November 5th.  -      Expected Outcome  Short: Meet with the dietician. Long: Adhere to a nutrition plan  Short: continue to read food labels, eat  balanced meals, and limit sugar beverages. Long: adhere to her nutrition plan          Nutrition Goals Discharge (Final Nutrition Goals Re-Evaluation): Nutrition Goals Re-Evaluation - 05/03/17 1226      Goals   Nutrition Goal  Carriann set a nutrition goal with the nutritionist where she learned about measuring food, reading labels, limiting sugar beverages and eating balanced meals. Her goal is to adhere to this plan    Expected Outcome  Short: continue to read food labels, eat  balanced meals, and limit sugar beverages. Long: adhere  to her nutrition plan        Psychosocial: Target Goals: Acknowledge presence or absence of significant depression and/or stress, maximize coping skills, provide positive support system. Participant is able to verbalize types and ability to use techniques and skills needed for reducing stress and depression.   Initial Review & Psychosocial Screening: Initial Psych Review & Screening - 02/27/17 1511      Initial Review   Current issues with  Current Depression;Current Stress Concerns;Current Anxiety/Panic    Source of Stress Concerns  Chronic Illness;Family    Comments  Relationship not well with her husband      [place]?  Yes    Comments  Daughters show her support      Barriers   Psychosocial barriers to participate in program  The patient should benefit from training in stress management and relaxation.      Screening Interventions   Interventions  Yes;To provide support and resources  with identified psychosocial needs;Provide feedback about the scores to participant;Encouraged to exercise;Program counselor consult    Expected Outcomes  Short Term goal: Utilizing psychosocial counselor, staff and physician to assist with identification of specific Stressors or current issues interfering with healing process. Setting desired goal for each stressor or current issue identified.;Long Term Goal: Stressors or current issues are controlled or eliminated.;Short Term goal: Identification and review with participant of any Quality of Life or Depression concerns found by scoring the questionnaire.;Long Term goal: The participant improves quality of Life and PHQ9 Scores as seen by post scores and/or verbalization of changes       Quality of Life Scores:   PHQ-9: Recent Review Flowsheet Data    Depression screen [facility] 2/9 05/01/2017 02/27/2017 11/29/2016 11/29/2016 07/22/2015   Decreased Interest _0 0   Down, Depressed, Hopeless _1 0   PHQ - 2 Score _2 0   Altered sleeping 0 0 3 3 -   Tired, decreased energy _3 -   Change in appetite _4 -   Feeling bad or failure about yourself  _5 -   Trouble concentrating _6 -   Moving slowly or fidgety/restless 0 0 0 0 -   Suicidal thoughts 0 _7 -   PHQ-9 Score _8 -   Difficult doing work/chores Not difficult at all Somewhat difficult - - -     Interpretation of Total Score  Total Score Depression Severity:  1-4 = Minimal depression, 5-9 = Mild depression, 10-14 = Moderate depression, 15-19 = Moderately severe depression, 20-27 = Severe depression   Psychosocial Evaluation and Intervention: Psychosocial Evaluation - 03/27/17 1240      Psychosocial Evaluation & Interventions   Interventions  Encouraged to exercise with the program and follow exercise prescription;Therapist referral;Relaxation education;Stress management education    Comments  Counselor met with Ms. Georgann [facility]keeper [facility]) for initial psychosocial evaluation.  She is a 71 year old who has pulmonary hypertension.  Albirtha has a strong support system with a daughter and son who live locally.  She is also married but reports her marriage is not positive and he hasn't been supportive of her for the past 10 years.  Jessabelle struggles with diabetes and sleep apnea and has a CPAP for the past 2 months to help with sleep - but she reports it does not.  She admits to a history of depression and anxiety for approximately 5-6 years.  She states her mood is negative most of the time and she is "unhappy" currently.  Rudolph's primary stress is her marriage and her health.  Counselor assessed Annamay for SI/HI due to the report on the PHQ-9 of "18" indicating "moderately severe" for depression and the answer to #9 with "thoughts of being better off dead or hurting yourself in some way" answered a "2" meaning she has these thoughts more than half the days.  Kailen states she will never hurt herself because she has (6) grandchildren that she  adores and brings her joy.  Counselor encouraged Doristine see a therapist in the near future and she reports she has a new contact that her insurance will cover now.  She agreed to contact this provider and get on their schedule asap.  Yvonnia also feels that her medications are too strong and she is seeing the Dr. about this on Thursday with hopes that some will  be discontinued.  Counselor may recommend a psychiatrist if her mood does not improve in the near future due to her PHQ scores and her answer to #9.  Counselor will follow with Hilma throughout the course of this program.     Expected Outcomes  Anhthu will benefit from consistent exercise to achieve her stated goals to "get thru this program."  She will also benefit from the educational and psychoeducational components of this program to learn improved coping strategies to help with her mood and stress level.  Jerlyn will contact and begin seeing a therpist in the near future.  She will speak to her Dr. about medication changes as well.  Counselor will follow.    Continue Psychosocial Services   Follow up required by counselor       Psychosocial Re-Evaluation: Psychosocial Re-Evaluation    [place] Name 03/24/17 1405 04/07/17 1248 05/01/17 1413 05/03/17 1246 05/08/17 1210     Psychosocial Re-Evaluation   Current issues with  Current Depression;Current Stress Concerns  Current Depression;Current Stress Concerns  Current Stress Concerns;Current Depression  -  -   Comments  Roux states that her home life has not changed since the start of the program. She states that if she could live alone she would. Peggs states she does not get along with her husband. She seems really depressed as of late. She states that she has a doctors appointment for her depression.  Bindi states that her husband is more like a roomate. She does not tell the family about there problems becuase she feels strong enough that she does not need to. She states this has been going on for  many years. She comes to [place] becuase her doctor and her children want her to. She has been seeing a therapist but she will not be able to see her anymore due to her insurance changing. She pays for [employer] but does not go.  Informed her that it would be good to exercise when the program is over to maintain fitness and decrease stress.  Toula states he has been doing better lately and has paid her bills. She states that she has not paid her bills in 7 months and she feels really good that she paid them recently. Her daughter helps pay the bills and she said her daughter was so happy for her. Informed her that she was doing a great job and that is a Film/video editor.  Counselor follow up with Macaela today reporting some improvement in her mood - although still has depressive symptoms.   Kayleann states she has not called a Social worker or psychiatrist yet but if planning to do so this week - since Thanksgiving is over.  She enjoys this program and is feeling some benefit from it.    Follow up by this Counselor with Vickii Chafe to see if has reached out to her counselor yet.  She has tried - but no response so she plans to call a former female counselor and pay out of pocket - if she has to.  She is concerned about the steps to get to that office.  Counselor offered to research counselors who will take her health insurance and she seemed delighted.  Counselor provided [employer] with a list of (2) counselors locally who take [employer].  She agreed to call today to set an appointment.  Kellyn states this program has been a good stress reducer for her.  Counselor assessed other coping strategies with relying on her faith; getting her  hair and nails done and exercise.  She continues to report her stress and living situation is depressing and she has lived with it so long - she doesn't think it will change or that it is harming her in any way.  Counselor educated Clinical cytogeneticist on this and that is when she agreed to reach out to a  Social worker.  She is more hesitant to consider a psychiatrist or medications at this time.  Counselor will follow.    Expected Outcomes  Short: Meet with the mental health counselor. Long: decrease PHQ9 scores.  Short:continue [facility] to decrease stress. Long: see a therapist to help cope with depression.  Short:continue financial independence. Long: continue budgeting to maintain financial balance.  Lonya will call a counselor or psychiatrist to help with her depressive symptoms that are still evident.  She will continue to exercise consistently for her mood and overall health.  Stepfanie was given two counselor locally who take her insurance to call. She agreed to reach out to one of them this week.  She will continue to exercise consistently.     Interventions  Encouraged to attend Pulmonary Rehabilitation for the exercise;Stress management education  Encouraged to attend Pulmonary Rehabilitation for the exercise  Encouraged to attend Pulmonary Rehabilitation for the exercise  -  Stress management education;Therapist referral   Continue Psychosocial Services   Follow up required by counselor  Follow up required by counselor  Follow up required by staff  Follow up required by staff  Follow up required by staff      Psychosocial Discharge (Final Psychosocial Re-Evaluation): Psychosocial Re-Evaluation - 05/08/17 1210      Psychosocial Re-Evaluation   Comments  Follow up by this Counselor with Jode to see if has reached out to her counselor yet.  She has tried - but no response so she plans to call a former female counselor and pay out of pocket - if she has to.  She is concerned about the steps to get to that office.  Counselor offered to research counselors who will take her health insurance and she seemed delighted.  Counselor provided [employer] with a list of (2) counselors locally who take [employer].  She agreed to call today to set an appointment.  Katurah states this program has been a good stress  reducer for her.  Counselor assessed other coping strategies with relying on her faith; getting her hair and nails done and exercise.  She continues to report her stress and living situation is depressing and she has lived with it so long - she doesn't think it will change or that it is harming her in any way.  Counselor educated Clinical cytogeneticist on this and that is when she agreed to reach out to a Social worker.  She is more hesitant to consider a psychiatrist or medications at this time.  Counselor will follow.     Expected Outcomes  Sharilynn was given two counselor locally who take her insurance to call. She agreed to reach out to one of them this week.  She will continue to exercise consistently.      Interventions  Stress management education;Therapist referral    Continue Psychosocial Services   Follow up required by staff       Education: Education Goals: Education classes will be provided on a weekly basis, covering required topics. Participant will state understanding/return demonstration of topics presented.  Learning Barriers/Preferences: Learning Barriers/Preferences - 02/27/17 1643      Learning Barriers/Preferences   Learning Barriers  None  Learning Preferences  None       Education Topics: Initial Evaluation Education: - Verbal, written and demonstration of respiratory meds, RPE/PD scales, oximetry and breathing techniques. Instruction on use of nebulizers and MDIs: cleaning and proper use, rinsing mouth with steroid doses and importance of monitoring MDI activations.   Pulmonary Rehab from 05/12/2017 in [facility] Cardiac and Pulmonary Rehab  Date  02/27/17  Educator  [facility]  Instruction Review Code  1- Verbalizes Understanding      General Nutrition Guidelines/Fats and Fiber: -Group instruction provided by verbal, written material, models and posters to present the general guidelines for heart healthy nutrition. Gives an explanation and review of dietary fats and fiber.   Pulmonary Rehab from  05/12/2017 in [facility] Cardiac and Pulmonary Rehab  Date  02/27/17  Educator  [facility]  Instruction Review Code  1- Verbalizes Understanding      Controlling Sodium/Reading Food Labels: -Group verbal and written material supporting the discussion of sodium use in heart healthy nutrition. Review and explanation with models, verbal and written materials for utilization of the food label.   Pulmonary Rehab from 05/12/2017 in [facility] Cardiac and Pulmonary Rehab  Date  03/27/17  Educator  [facility]  Instruction Review Code  1- Verbalizes Understanding      Exercise Physiology & Risk Factors: - Group verbal and written instruction with models to review the exercise physiology of the cardiovascular system and associated critical values. Details cardiovascular disease risk factors and the goals associated with each risk factor.   Aerobic Exercise & Resistance Training: - Gives group verbal and written discussion on the health impact of inactivity. On the components of aerobic and resistive training programs and the benefits of this training and how to safely progress through these programs.   Pulmonary Rehab from 05/12/2017 in [facility] Cardiac and Pulmonary Rehab  Date  04/07/17  Educator  [facility]  Instruction Review Code  1- Verbalizes Understanding      Flexibility, Balance, General Exercise Guidelines: - Provides group verbal and written instruction on the benefits of flexibility and balance training programs. Provides general exercise guidelines with specific guidelines to those with heart or lung disease. Demonstration and skill practice provided.   Stress Management: - Provides group verbal and written instruction about the health risks of elevated stress, cause of high stress, and healthy ways to reduce stress.   Depression: - Provides group verbal and written instruction on the correlation between heart/lung disease and depressed mood, treatment options, and the stigmas associated with seeking treatment.    Pulmonary Rehab from 05/12/2017 in [facility] Cardiac and Pulmonary Rehab  Date  04/19/17  Educator  [facility]  Instruction Review Code  1- Verbalizes Understanding      Exercise & Equipment Safety: - Individual verbal instruction and demonstration of equipment use and safety with use of the equipment.   Infection Prevention: - Provides verbal and written material to individual with discussion of infection control including proper hand washing and proper equipment cleaning during exercise session.   Pulmonary Rehab from 05/12/2017 in [facility] Cardiac and Pulmonary Rehab  Date  02/27/17  Educator  [facility]  Instruction Review Code  1- Verbalizes Understanding      Falls Prevention: - Provides verbal and written material to individual with discussion of falls prevention and safety.   Pulmonary Rehab from 05/12/2017 in [facility] Cardiac and Pulmonary Rehab  Date  02/27/17  Educator  [facility]  Instruction Review Code  1- Verbalizes Understanding      Diabetes: - Individual verbal and  written instruction to review signs/symptoms of diabetes, desired ranges of glucose level fasting, after meals and with exercise. Advice that pre and post exercise glucose checks will be done for 3 sessions at entry of program.   Chronic Lung Diseases: - Group verbal and written instruction to review new updates, new respiratory medications, new advancements in procedures and treatments. Provide informative websites and "800" numbers of self-education.   Pulmonary Rehab from 05/12/2017 in [facility] Cardiac and Pulmonary Rehab  Date  03/08/17  Educator  [facility]  Instruction Review Code  1- Verbalizes Understanding      Lung Procedures: - Group verbal and written instruction to describe testing methods done to diagnose lung disease. Review the outcome of test results. Describe the treatment choices: Pulmonary Function Tests, ABGs and oximetry.   Pulmonary Rehab from 05/12/2017 in [facility] Cardiac and Pulmonary Rehab  Date  05/10/17  Educator  [facility]    Instruction Review Code  1- Verbalizes Understanding      Energy Conservation: - Provide group verbal and written instruction for methods to conserve energy, plan and organize activities. Instruct on pacing techniques, use of adaptive equipment and posture/positioning to relieve shortness of breath.   Pulmonary Rehab from 05/12/2017 in [facility] Cardiac and Pulmonary Rehab  Date  05/03/17  Educator  [facility]  Instruction Review Code  1- Verbalizes Understanding      Triggers: - Group verbal and written instruction to review types of environmental controls: home humidity, furnaces, filters, dust mite/pet prevention, HEPA vacuums. To discuss weather changes, air quality and the benefits of nasal washing.   Exacerbations: - Group verbal and written instruction to provide: warning signs, infection symptoms, calling MD promptly, preventive modes, and value of vaccinations. Review: effective airway clearance, coughing and/or vibration techniques. Create an Sports administrator.   Oxygen: - Individual and group verbal and written instruction on oxygen therapy. Includes supplement oxygen, available portable oxygen systems, continuous and intermittent flow rates, oxygen safety, concentrators, and Medicare reimbursement for oxygen.   Respiratory Medications: - Group verbal and written instruction to review medications for lung disease. Drug class, frequency, complications, importance of spacers, rinsing mouth after steroid MDI's, and proper cleaning methods for nebulizers.   AED/CPR: - Group verbal and written instruction with the use of models to demonstrate the basic use of the AED with the basic ABC's of resuscitation.   Breathing Retraining: - Provides individuals verbal and written instruction on purpose, frequency, and proper technique of diaphragmatic breathing and pursed-lipped breathing. Applies individual practice skills.   Pulmonary Rehab from 05/12/2017 in [facility] Cardiac and Pulmonary Rehab  Date   02/27/17  Educator  [facility]  Instruction Review Code  1- Verbalizes Understanding      Anatomy and Physiology of the Lungs: - Group verbal and written instruction with the use of models to provide basic lung anatomy and physiology related to function, structure and complications of lung disease.   Pulmonary Rehab from 05/12/2017 in [facility] Cardiac and Pulmonary Rehab  Date  05/10/17  Educator  [facility]  Instruction Review Code  1- Verbalizes Understanding      Anatomy & Physiology of the Heart: - Group verbal and written instruction and models provide basic cardiac anatomy and physiology, with the coronary electrical and arterial systems. Review of: AMI, Angina, Valve disease, Heart Failure, Cardiac Arrhythmia, Pacemakers, and the ICD.   Pulmonary Rehab from 05/12/2017 in [facility] Cardiac and Pulmonary Rehab  Date  04/21/17  Educator  [facility]  Instruction Review Code  1- Verbalizes Understanding  Heart Failure: - Group verbal and written instruction on the basics of heart failure: signs/symptoms, treatments, explanation of ejection fraction, enlarged heart and cardiomyopathy.   Pulmonary Rehab from 05/12/2017 in [facility] Cardiac and Pulmonary Rehab  Date  04/21/17  Educator  [place]  Instruction Review Code  1- Verbalizes Understanding      Sleep Apnea: - Individual verbal and written instruction to review Obstructive Sleep Apnea. Review of risk factors, methods for diagnosing and types of masks and machines for OSA.   Pulmonary Rehab from 05/12/2017 in [facility] Cardiac and Pulmonary Rehab  Date  02/27/17  Educator  [facility]  Instruction Review Code  1- Verbalizes Understanding      Anxiety: - Provides group, verbal and written instruction on the correlation between heart/lung disease and anxiety, treatment options, and management of anxiety.   Relaxation: - Provides group, verbal and written instruction about the benefits of relaxation for patients with heart/lung disease. Also provides patients with  examples of relaxation techniques.   Pulmonary Rehab from 05/12/2017 in [facility] Cardiac and Pulmonary Rehab  Date  03/22/17  Educator  [facility]  Instruction Review Code  1- Verbalizes Understanding      Cardiac Medications: - Group verbal and written instruction to review commonly prescribed medications for heart disease. Reviews the medication, class of the drug, and side effects.   Pulmonary Rehab from 05/12/2017 in [facility] Cardiac and Pulmonary Rehab  Date  05/12/17  Educator  [place]  Instruction Review Code  1- Verbalizes Understanding      Know Your Numbers: -Group verbal and written instruction about important numbers in your health.  Review of Cholesterol, Blood Pressure, Diabetes, and BMI and the role they play in your overall health.   Pulmonary Rehab from 05/12/2017 in [facility] Cardiac and Pulmonary Rehab  Date  03/17/17  Educator  [facility]  Instruction Review Code  1- Verbalizes Understanding      Other: -Provides group and verbal instruction on various topics (see comments)    Knowledge Questionnaire Score: Knowledge Questionnaire Score - 02/27/17 1519      Knowledge Questionnaire Score   Pre Score  8/10        Core Components/Risk Factors/Patient Goals at Admission: Personal Goals and Risk Factors at Admission - 02/27/17 1526      Core Components/Risk Factors/Patient Goals on Admission    Weight Management  Weight Maintenance;Yes    Intervention  Weight Management: Develop a combined nutrition and exercise program designed to reach desired caloric intake, while maintaining appropriate intake of nutrient and fiber, sodium and fats, and appropriate energy expenditure required for the weight goal.;Weight Management: Provide education and appropriate resources to help participant work on and attain dietary goals.;Weight Management/Obesity: Establish reasonable short term and long term weight goals.    Admit Weight  151 lb 8 oz (68.7 kg)    Goal Weight: Short Term  146 lb (66.2 kg)    Goal  Weight: Long Term  150 lb (68 kg)    Expected Outcomes  Short Term: Continue to assess and modify interventions until short term weight is achieved;Long Term: Adherence to nutrition and physical activity/exercise program aimed toward attainment of established weight goal;Weight Maintenance: Understanding of the daily nutrition guidelines, which includes 25-35% calories from fat, 7% or less cal from saturated fats, less than 273m cholesterol, less than 1.5gm of sodium, & 5 or more servings of fruits and vegetables daily;Understanding recommendations for meals to include 15-35% energy as protein, 25-35% energy from fat, 35-60% energy from carbohydrates, less than  224m of dietary cholesterol, 20-35 gm of total fiber daily;Understanding of distribution of calorie intake throughout the day with the consumption of 4-5 meals/snacks    Diabetes  Yes    Intervention  Provide education about signs/symptoms and action to take for hypo/hyperglycemia.;Provide education about proper nutrition, including hydration, and aerobic/resistive exercise prescription along with prescribed medications to achieve blood glucose in normal ranges: Fasting glucose 65-99 mg/dL    Expected Outcomes  Short Term: Participant verbalizes understanding of the signs/symptoms and immediate care of hyper/hypoglycemia, proper foot care and importance of medication, aerobic/resistive exercise and nutrition plan for blood glucose control.;Long Term: Attainment of HbA1C < 7%.    Hypertension  Yes    Intervention  Provide education on lifestyle modifcations including regular physical activity/exercise, weight management, moderate sodium restriction and increased consumption of fresh fruit, vegetables, and low fat dairy, alcohol moderation, and smoking cessation.;Monitor prescription use compliance.    Expected Outcomes  Short Term: Continued assessment and intervention until BP is < 140/956mHG in hypertensive participants. < 130/8070mG in  hypertensive participants with diabetes, heart failure or chronic kidney disease.;Long Term: Maintenance of blood pressure at goal levels.    Stress  Yes    Intervention  Offer individual and/or small group education and counseling on adjustment to heart disease, stress management and health-related lifestyle change. Teach and support self-help strategies.;Refer participants experiencing significant psychosocial distress to appropriate mental health specialists for further evaluation and treatment. When possible, include family members and significant others in education/counseling sessions.    Expected Outcomes  Short Term: Participant demonstrates changes in health-related behavior, relaxation and other stress management skills, ability to obtain effective social support, and compliance with psychotropic medications if prescribed.;Long Term: Emotional wellbeing is indicated by absence of clinically significant psychosocial distress or social isolation.       Core Components/Risk Factors/Patient Goals Review:  Goals and Risk Factor Review    Row Name 03/24/17 1358 04/12/17 1226 05/03/17 1222         Core Components/Risk Factors/Patient Goals Review   Personal Goals Review  Weight Management/Obesity;Improve shortness of breath with ADL's;Diabetes;Stress;Hypertension  Weight Management/Obesity;Stress;Diabetes;Hypertension  Stress;Diabetes;Hypertension;Weight Management/Obesity     Review  PegNikittas had alot of stress dealing with her home situation. She states her husband and her do not get along and she would rather not be living with him. She states her diabetes has been under control and she checks her levels at home.  Her ADL's have not  improved much since the start of the program .   PegMyosha happy with her weight of 156, she wishes to maintain in the 150s-160s. She states her blood pressure has been doing really well since starting this program, even with the continual stress related to her home  life. She has reported a slight increase in energy while doing her ADL's and cooking, which she is very proud of herself for! Her stress has not gotten any better and she does not see that changing anytime soon.  [place] saw the nutritionist recently and was given ideas of what food to eat to get the most nutritious value from it, since she does not eat that much food. She is still happy with her weight. She recently had her doctor appointment where he said she was doing very well with her progress. She admits to wearing her CPAP most nights, but knows she needs ot be wearing it every night. Her diabetes is still doing well and so is her blood pressure. There  is still no change in her stress level, but she is practicing ways relax.     Expected Outcomes  Short: attend regularly to decrease stress. Long: Maintain a healthy stress free home.  Short: attend [facility] regularly to help improve ADL stamina, continue to maintain weight, decrease blood pressure, improve A1C, and help decrease stress. Long: continue to practice self care habits  Short: finish [facility] and attend all education topics during class, wear her CPAP at night Long: continue to practice self care and make plans as to what she is doing after this program to keep up all her hard work.         Core Components/Risk Factors/Patient Goals at Discharge (Final Review):  Goals and Risk Factor Review - 05/03/17 1222      Core Components/Risk Factors/Patient Goals Review   Personal Goals Review  Stress;Diabetes;Hypertension;Weight Management/Obesity    Review  Mariame saw the nutritionist recently and was given ideas of what food to eat to get the most nutritious value from it, since she does not eat that much food. She is still happy with her weight. She recently had her doctor appointment where he said she was doing very well with her progress. She admits to wearing her CPAP most nights, but knows she needs ot be wearing it every night. Her diabetes  is still doing well and so is her blood pressure. There is still no change in her stress level, but she is practicing ways relax.    Expected Outcomes  Short: finish [facility] and attend all education topics during class, wear her CPAP at night Long: continue to practice self care and make plans as to what she is doing after this program to keep up all her hard work.        ITP Comments: ITP Comments    Row Name 02/27/17 1626 03/06/17 1240 03/08/17 1312 03/27/17 0834 04/24/17 0851   ITP Comments  Medical evaluation completed. Chart sent to Dr. Emily Filbert director of [place] for signature and review.  Spent 15 minutes talking with Demetress about PLB.  Linnette Bird Tailor did not complete her rehab session. Her pre exercise CBG was 72, crackers given. CBG went down to 61, gel given. Her CBG went up to 82. Patient given education on importance of protein and checking her sugar regularly. She was instructed to eat lunch right when she gets home.   30 day review completed. ITP sent to Dr. Emily Filbert Director of [place]. Continue with ITP unless changes are made by physician.    30 day review completed. ITP sent to Dr. Emily Filbert Director of [place]. Continue with ITP unless changes are made by physician.     [place] Name 05/22/17 0833           ITP Comments  30 day review completed. ITP sent to Dr. Emily Filbert Director of [place]. Continue with ITP unless changes are made by physician.            Comments: 30 day review

## 2017-05-22 NOTE — Progress Notes (Signed)
Daily Session Note  Patient Details  Name: Jamie Murray MRN: [id number] Date of Birth: December 03, 1945 Referring Provider:     Pulmonary Rehab from 02/27/2017 in [facility] Cardiac and Pulmonary Rehab  Referring Provider  Trellis Moment MD      Encounter Date: 05/22/2017  Check In: Session Check In - 05/22/17 1132      Check-In   Location  [facility]-Cardiac & Pulmonary Rehab    Staff Present  Justin Mend RCP,RRT,BSRT;Amanda Oletta Darter, BA, ACSM CEP, Exercise Physiologist;Kelly Amedeo Plenty, BS, ACSM CEP, Exercise Physiologist    Supervising physician immediately available to respond to emergencies  [facility] immediately available ER MD    Physician(s)  Dr. Kerman Passey and Owens Shark    Medication changes reported      No    Fall or balance concerns reported     No    Warm-up and Cool-down  Performed as group-led instruction    Resistance Training Performed  Yes    VAD Patient?  No      Pain Assessment   Currently in Pain?  No/denies          Social History   Tobacco Use  Smoking Status Never Smoker  Smokeless Tobacco Never Used    Goals Met:  Independence with exercise equipment Exercise tolerated well No report of cardiac concerns or symptoms Strength training completed today  Goals Unmet:  Not Applicable  Comments: Pt able to follow exercise prescription today without complaint.  Will continue to monitor for progression.   Dr. Emily Filbert is Medical Director for [place] and [facility].

## 2017-05-24 DIAGNOSIS — I272 Pulmonary hypertension, unspecified: Secondary | ICD-10-CM | POA: Diagnosis not present

## 2017-05-24 NOTE — Progress Notes (Signed)
Daily Session Note  Patient Details  Name: Jamie Murray MRN: [id number] Date of Birth: 28-Mar-1946 Referring Provider:     Pulmonary Rehab from 02/27/2017 in [facility] Cardiac and Pulmonary Rehab  Referring Provider  Trellis Moment MD      Encounter Date: 05/24/2017  Check In: Session Check In - 05/24/17 1136      Check-In   Location  [facility]-Cardiac & Pulmonary Rehab    Staff Present  Renita Papa, RN BSN;Joseph Darrin Nipper, [place], ACSM RCEP, Exercise Physiologist    Supervising physician immediately available to respond to emergencies  [facility] immediately available ER MD    Physician(s)  Dr. Jimmye Norman and Archie Balboa    Medication changes reported      No    Fall or balance concerns reported     No    Warm-up and Cool-down  Performed as group-led instruction    Resistance Training Performed  Yes    VAD Patient?  No      Pain Assessment   Currently in Pain?  No/denies        Exercise Prescription Changes - 05/23/17 1600      Response to Exercise   Blood Pressure (Admit)  132/62    Blood Pressure (Exit)  126/70    Heart Rate (Admit)  76 bpm    Heart Rate (Exercise)  120 bpm    Heart Rate (Exit)  88 bpm    Oxygen Saturation (Admit)  100 %    Oxygen Saturation (Exercise)  95 %    Oxygen Saturation (Exit)  100 %    Rating of Perceived Exertion (Exercise)  11    Perceived Dyspnea (Exercise)  0    Symptoms  none    Duration  Continue with 45 min of aerobic exercise without signs/symptoms of physical distress.    Intensity  THRR unchanged      Progression   Progression  Continue to progress workloads to maintain intensity without signs/symptoms of physical distress.    Average METs  2.89      Resistance Training   Training Prescription  Yes    Weight  4 lbs    Reps  10-15      Interval Training   Interval Training  No      Treadmill   MPH  1.5    Grade  1.5    Minutes  15    METs  2.46      NuStep   Level  6    SPM  64    Minutes  15    METs  2.7      REL-XR   Level  3    Speed  51    Minutes  15    METs  3.5      Home Exercise Plan   Plans to continue exercise at  [employer] (comment) [employer]    Frequency  Add 2 additional days to program exercise sessions.    Initial Home Exercises Provided  03/15/17       Social History   Tobacco Use  Smoking Status Never Smoker  Smokeless Tobacco Never Used    Goals Met:  Proper associated with RPD/PD & O2 Sat Independence with exercise equipment Using PLB without cueing & demonstrates good technique Exercise tolerated well Strength training completed today  Goals Unmet:  Not Applicable  Comments: Pt able to follow exercise prescription today without complaint.  Will continue to monitor for progression.    Dr. Emily Filbert  is Medical Director for [facility] and [facility]. 

## 2017-06-07 ENCOUNTER — Encounter: Payer: Medicare Other | Attending: Internal Medicine | Admitting: *Deleted

## 2017-06-07 DIAGNOSIS — I272 Pulmonary hypertension, unspecified: Secondary | ICD-10-CM | POA: Diagnosis not present

## 2017-06-07 NOTE — Progress Notes (Signed)
Daily Session Note  Patient Details  Name: Yexalen Deike MRN: [id number] Date of Birth: 14-Dec-1945 Referring Provider:     Pulmonary Rehab from 02/27/2017 in [facility] Cardiac and Pulmonary Rehab  Referring Provider  Trellis Moment MD      Encounter Date: 06/07/2017  Check In: Session Check In - 06/07/17 1142      Check-In   Staff Present  Renita Papa, RN BSN;Joseph Darrin Nipper, [place], ACSM RCEP, Exercise Physiologist    Supervising physician immediately available to respond to emergencies  [facility] immediately available ER MD    Physician(s)  Dr. Cinda Quest and Joni Fears    Medication changes reported      No    Fall or balance concerns reported     No    Warm-up and Cool-down  Performed as group-led instruction    Resistance Training Performed  Yes    VAD Patient?  No      Pain Assessment   Currently in Pain?  No/denies          Social History   Tobacco Use  Smoking Status Never Smoker  Smokeless Tobacco Never Used    Goals Met:  Proper associated with RPD/PD & O2 Sat Independence with exercise equipment Using PLB without cueing & demonstrates good technique Exercise tolerated well Strength training completed today  Goals Unmet:  Not Applicable  Comments: Pt able to follow exercise prescription today without complaint.  Will continue to monitor for progression.    Dr. Emily Filbert is Medical Director for [place] and [facility].

## 2017-06-12 DIAGNOSIS — I272 Pulmonary hypertension, unspecified: Secondary | ICD-10-CM | POA: Diagnosis not present

## 2017-06-12 NOTE — Progress Notes (Signed)
Daily Session Note  Patient Details  Name: Jamie Murray MRN: [id number] Date of Birth: 12-05-1945 Referring Provider:     Pulmonary Rehab from 02/27/2017 in [facility] Cardiac and Pulmonary Rehab  Referring Provider  Trellis Moment MD      Encounter Date: 06/12/2017  Check In: Session Check In - 06/12/17 1141      Check-In   Location  [facility]-Cardiac & Pulmonary Rehab    Staff Present  Justin Mend Jaci Carrel, BS, ACSM CEP, Exercise Physiologist;Amanda Oletta Darter, [place], ACSM CEP, Exercise Physiologist    Supervising physician immediately available to respond to emergencies  [facility] immediately available ER MD    Physician(s)  Dr. Kerman Passey and Jimmye Norman    Medication changes reported      No    Fall or balance concerns reported     No    Warm-up and Cool-down  Performed as group-led instruction    Resistance Training Performed  Yes    VAD Patient?  No      Pain Assessment   Currently in Pain?  No/denies          Social History   Tobacco Use  Smoking Status Never Smoker  Smokeless Tobacco Never Used    Goals Met:  Independence with exercise equipment Exercise tolerated well Personal goals reviewed No report of cardiac concerns or symptoms Strength training completed today  Goals Unmet:  Not Applicable  Comments: Pt able to follow exercise prescription today without complaint.  Will continue to monitor for progression.   Dr. Emily Filbert is Medical Director for [place] and [facility].

## 2017-06-14 DIAGNOSIS — I272 Pulmonary hypertension, unspecified: Secondary | ICD-10-CM

## 2017-06-14 NOTE — Progress Notes (Signed)
Daily Session Note  Patient Details  Name: Jamie Murray  MRN: [id number] Date of Birth: [birth year] Referring Provider:     Pulmonary Rehab from 02/27/2017 in [facility] Cardiac and Pulmonary Rehab  Referring Provider  Trellis Moment MD      Encounter Date: 06/14/2017  Check In: Session Check In - 06/14/17 1118      Check-In   Location  [facility]-Cardiac & Pulmonary Rehab    Staff Present  Justin Mend Lorre Nick, [place], ACSM RCEP, Exercise Physiologist;Meredith Sherryll Burger, RN BSN    Supervising physician immediately available to respond to emergencies  [facility] immediately available ER MD    Physician(s)  Dr. Quentin Cornwall and Jimmye Norman    Medication changes reported      No    Fall or balance concerns reported     No    Warm-up and Cool-down  Performed as group-led instruction    Resistance Training Performed  Yes    VAD Patient?  No      Pain Assessment   Currently in Pain?  No/denies          Social History   Tobacco Use  Smoking Status Never Smoker  Smokeless Tobacco Never Used    Goals Met:  Independence with exercise equipment Exercise tolerated well No report of cardiac concerns or symptoms Strength training completed today  Goals Unmet:  Not Applicable  Comments: Pt able to follow exercise prescription today without complaint.  Will continue to monitor for progression.   Dr. Emily Filbert is Medical Director for [place] and [facility].

## 2017-06-16 DIAGNOSIS — I272 Pulmonary hypertension, unspecified: Secondary | ICD-10-CM | POA: Diagnosis not present

## 2017-06-16 NOTE — Progress Notes (Deleted)
Daily Session Note  Patient Details  Name: Jamie Murray MRN: [id number] Date of Birth: [birth year] Referring Provider:     Pulmonary Rehab from 02/27/2017 in [facility] Cardiac and Pulmonary Rehab  Referring Provider  Trellis Moment MD      Encounter Date: 06/16/2017  Check In: Session Check In - 06/16/17 1127      Check-In   Location  [facility]-Cardiac & Pulmonary Rehab    Staff Present  Justin Mend Lorre Nick, [place], ACSM RCEP, Exercise Physiologist;Meredith Sherryll Burger, RN BSN    Supervising physician immediately available to respond to emergencies  [facility] immediately available ER MD    Physician(s)  Dr. Alfred Levins and Corky Downs    Medication changes reported      No    Fall or balance concerns reported     No    Warm-up and Cool-down  Performed as group-led instruction    Resistance Training Performed  Yes    VAD Patient?  No      Pain Assessment   Currently in Pain?  No/denies          Social History   Tobacco Use  Smoking Status Never Smoker  Smokeless Tobacco Never Used    Goals Met:  Independence with exercise equipment Exercise tolerated well No report of cardiac concerns or symptoms Strength training completed today  Goals Unmet:  Not Applicable  Comments: Pt able to follow exercise prescription today without complaint.  Will continue to monitor for progression.   Dr. Emily Filbert is Medical Director for [place] and [facility].

## 2017-06-16 NOTE — Progress Notes (Signed)
Incomplete Session Note  Patient Details  Name: Codie Hainer MRN: [id number] Date of Birth: 06-Nov-1945 Referring Provider:     Pulmonary Rehab from 02/27/2017 in [facility] Cardiac and Pulmonary Rehab  Referring Provider  Trellis Moment MD      Angelamarie Melbourne Abts did not complete her rehab session.  Flor is still feeling under the weather and is going home to rest.

## 2017-06-19 DIAGNOSIS — I272 Pulmonary hypertension, unspecified: Secondary | ICD-10-CM

## 2017-06-19 NOTE — Progress Notes (Signed)
Pulmonary Individual Treatment Plan  Patient Details  Name: Jamie Murray MRN: [id number] Date of Birth: 1945-09-24 Referring Provider:     Pulmonary Rehab from 02/27/2017 in [facility] Card[facility] and Pulmonary Rehab  Referring Provider  Trellis Moment MD      Initial Encounter Date:    Pulmonary Rehab from 02/27/2017 in [facility] Cardiac and Pulmonary Rehab  Date  02/27/17  Referring Provider  Trellis Moment MD      Visit Diagnosis: Pulmonary hypertension ([place])  Patient's Home Medications on Admission:  Current Outpatient Medications:  .  aspirin 81 MG tablet, Take 81 mg by mouth daily., Disp: , Rfl:  .  atorvastatin (LIPITOR) 10 MG tablet, Take 1 tablet (10 mg total) by mouth daily., Disp: 90 tablet, Rfl: 3 .  Blood Glucose Monitoring Suppl (GNP EASY TOUCH GLUCOSE METER) DEVI, See admin instructions., Disp: , Rfl: 0 .  buPROPion (WELLBUTRIN SR) 150 MG 12 hr tablet, Take 1 tablet (150 mg total) by mouth 2 (two) times daily. Take 1 tablet in the morning for 2 weeks then take twice and day., Disp: 180 tablet, Rfl: 1 .  Cholecalciferol (VITAMIN D3) 2000 units TABS, Take 2,000 Units by mouth daily., Disp: , Rfl:  .  clonazePAM (KLONOPIN) 1 MG tablet, Take 1 mg by mouth at bedtime., Disp: , Rfl: 0 .  clopidogrel (PLAVIX) 75 MG tablet, Take 1 tablet (75 mg total) by mouth daily., Disp: 90 tablet, Rfl: 3 .  Dulaglutide (TRULICITY) 1.5 OV/7.8HY SOPN, Inject 1.5 mg into the skin once a week., Disp: 12 pen, Rfl: 3 .  EASY TOUCH TEST test strip, USE TO CHECK BLOOD SUGAR ONCE DAILY, Disp: , Rfl: 0 .  furosemide (LASIX) 20 MG tablet, Take 20 mg by mouth daily as needed., Disp: , Rfl: 2 .  glimepiride (AMARYL) 2 MG tablet, Take 2 mg by mouth daily., Disp: , Rfl:  .  hydrALAZINE (APRESOLINE) 50 MG tablet, Take 100 mg by mouth 2 (two) times daily., Disp: , Rfl: 1 .  ipratropium-albuterol (DUONEB) 0.5-2.5 (3) MG/3ML SOLN, Take 3 mLs by nebulization every 4 (four) hours as needed., Disp: 360 mL, Rfl: 12 .   LORazepam (ATIVAN) 0.5 MG tablet, Take 1 tablet (0.5 mg total) by mouth daily as needed., Disp: 30 tablet, Rfl: 1 .  losartan (COZAAR) 100 MG tablet, Take 100 mg by mouth daily., Disp: , Rfl: 6 .  metFORMIN (GLUCOPHAGE-XR) 500 MG 24 hr tablet, Take 4 tablets (2,000 mg total) by mouth daily with breakfast., Disp: 360 tablet, Rfl: 0 .  metoprolol succinate (TOPROL-XL) 100 MG 24 hr tablet, Take 100 mg by mouth daily. , Disp: , Rfl:  .  Multiple Vitamin (MULTIVITAMIN) tablet, Take 1 tablet by mouth daily., Disp: , Rfl:  .  pantoprazole (PROTONIX) 40 MG tablet, Take 1 tablet (40 mg total) by mouth 2 (two) times daily., Disp: 60 tablet, Rfl: 11 .  potassium chloride (KLOR-CON 10) 10 MEQ tablet, Take 1 tablet (10 mEq total) by mouth 2 (two) times daily., Disp: 60 tablet, Rfl: 0 .  PROAIR HFA 108 (90 Base) MCG/ACT inhaler, Inhale 2 puffs into the lungs every 4 (four) hours as needed for wheezing or shortness of breath., Disp: 8.5 g, Rfl: 2 .  spironolactone (ALDACTONE) 25 MG tablet, Take 25 mg by mouth daily., Disp: , Rfl: 6  Past Medical History: Past Medical History:  Diagnosis Date  . Allergy   . Anemia   . Anxiety   . Depression   . Diabetes ([place])   .  GERD (gastroesophageal reflux disease)   . Hyperlipidemia   . Hypertension   . Insomnia   . Migraines   . Osteoporosis   . [facility] spotted fever   . Transient cerebral ischemia   . Vertigo    every 2-3 months    Tobacco Use: Social History   Tobacco Use  Smoking Status Never Smoker  Smokeless Tobacco Never Used    Labs: Recent Review Flowsheet Data    Labs for ITP Cardiac and Pulmonary Rehab Latest Ref Rng & Units 04/18/2012 07/22/2015 03/07/2016 06/07/2016 11/29/2016   Cholestrol 100 - 199 mg/dL 185 139 145 213(H) 95(L)   LDLCALC 0 - 99 mg/dL 118(H) 63 77 143(H) 45   HDL >39 mg/dL 42 45 43 37(L) 29(L)   Trlycerides 0 - 149 mg/dL 126 156(H) 123 164(H) 105   Hemoglobin A1c - 6.5(H) - 7.8% - -       Pulmonary Assessment  Scores: Pulmonary Assessment Scores    Row Name 02/27/17 1515 05/01/17 1205       ADL [facility]   ADL Phase  Entry  Mid    SOB Score total  48  34    Rest  0  0    Walk  2  1    Stairs  1  3    Bath  3  2    Dress  0  2    Shop  3  1      CAT Score   CAT Score  14  -      mMRC Score   mMRC Score  4  -       Pulmonary Function Assessment: Pulmonary Function Assessment - 02/27/17 1525      Breath   Bilateral Breath Sounds  Clear    Shortness of Breath  No she feels tired not short of breath        Exercise Target Goals:    Exercise Program Goal: Individual exercise prescription set with THRR, safety & activity barriers. Participant demonstrates ability to understand and report RPE using BORG scale, to self-measure pulse accurately, and to acknowledge the importance of the exercise prescription.  Exercise Prescription Goal: Starting with aerobic activity 30 plus minutes a day, 3 days per week for initial exercise prescription. Provide home exercise prescription and guidelines that participant acknowledges understanding prior to discharge.  Activity Barriers & Risk Stratification: Activity Barriers & Cardiac Risk Stratification - 02/27/17 1640      Activity Barriers & Cardiac Risk Stratification   Activity Barriers  Deconditioning;Muscular Weakness;Shortness of Breath;Assistive Device       6 Minute Walk: 6 Minute Walk    Row Name 02/27/17 1637         6 Minute Walk   Phase  Initial     Distance  610 feet     Walk Time  6 minutes     # of Rest Breaks  0     MPH  1.15     METS  2.08     RPE  9     Perceived Dyspnea   0     VO2 Peak  7.28     Symptoms  No walking with cane and wearing slides     Resting HR  72 bpm     Resting BP  128/66     Resting Oxygen Saturation   99 %     Exercise Oxygen Saturation  during 6 min walk  90 %  Max Ex. HR  98 bpm     Max Ex. BP  152/80     2 Minute Post BP  134/74       Interval HR   1 Minute HR  85     2 Minute HR   86     3 Minute HR  87     4 Minute HR  96     5 Minute HR  92     6 Minute HR  98     2 Minute Post HR  76     Interval Heart Rate?  Yes       Interval Oxygen   Interval Oxygen?  Yes     Baseline Oxygen Saturation %  99 %     1 Minute Oxygen Saturation %  96 %     1 Minute Liters of Oxygen  0 L room air     2 Minute Oxygen Saturation %  93 %     2 Minute Liters of Oxygen  0 L     3 Minute Oxygen Saturation %  90 %     3 Minute Liters of Oxygen  0 L     4 Minute Oxygen Saturation %  97 %     4 Minute Liters of Oxygen  0 L     5 Minute Oxygen Saturation %  99 %     5 Minute Liters of Oxygen  0 L     6 Minute Oxygen Saturation %  91 %     6 Minute Liters of Oxygen  0 L     2 Minute Post Oxygen Saturation %  95 %     2 Minute Post Liters of Oxygen  0 L       Oxygen Initial Assessment: Oxygen Initial Assessment - 02/27/17 1520      Home Oxygen   Home Oxygen Device  None    Sleep Oxygen Prescription  CPAP    Liters per minute  0    Home Exercise Oxygen Prescription  None    Home at Rest Exercise Oxygen Prescription  None    Compliance with Home Oxygen Use  Yes      Initial 6 min Walk   Oxygen Used  None      Program Oxygen Prescription   Program Oxygen Prescription  None      Intervention   Short Term Goals  To learn and understand importance of monitoring SPO2 with pulse oximeter and demonstrate accurate use of the pulse oximeter.;To learn and demonstrate proper pursed lip breathing techniques or other breathing techniques.;To learn and understand importance of maintaining oxygen saturations>88%;To learn and demonstrate proper use of respiratory medications    Long  Term Goals  Maintenance of O2 saturations>88%;Compliance with respiratory medication;Demonstrates proper use of MDI's;Exhibits proper breathing techniques, such as pursed lip breathing or other method taught during program session;Verbalizes importance of monitoring SPO2 with pulse oximeter and return  demonstration       Oxygen Re-Evaluation: Oxygen Re-Evaluation    Row Name 03/06/17 1240 03/24/17 1402 04/10/17 1606 05/03/17 1231 06/12/17 1256     Program Oxygen Prescription   Program Oxygen Prescription  None  None  None  None  None     Home Oxygen   Home Oxygen Device  None  None  None  None  None   Sleep Oxygen Prescription  CPAP  CPAP  CPAP  CPAP  CPAP   Liters per minute  0  0  0  0  0   Home Exercise Oxygen Prescription  None  None  None  None  None   Home at Rest Exercise Oxygen Prescription  None  None  None  None  None   Compliance with Home Oxygen Use  Yes CPAP  Yes CPAP  Yes CPAP  Yes  Yes     Goals/Expected Outcomes   Short Term Goals  To learn and understand importance of maintaining oxygen saturations>88%;To learn and demonstrate proper use of respiratory medications;To learn and demonstrate proper pursed lip breathing techniques or other breathing techniques.;To learn and understand importance of monitoring SPO2 with pulse oximeter and demonstrate accurate use of the pulse oximeter.  To learn and understand importance of maintaining oxygen saturations>88%;To learn and exhibit compliance with exercise, home and travel O2 prescription;To learn and understand importance of monitoring SPO2 with pulse oximeter and demonstrate accurate use of the pulse oximeter.;To learn and demonstrate proper pursed lip breathing techniques or other breathing techniques.;To learn and demonstrate proper use of respiratory medications  To learn and understand importance of maintaining oxygen saturations>88%;To learn and demonstrate proper use of respiratory medications;To learn and exhibit compliance with exercise, home and travel O2 prescription;To learn and demonstrate proper pursed lip breathing techniques or other breathing techniques.;To learn and understand importance of monitoring SPO2 with pulse oximeter and demonstrate accurate use of the pulse oximeter.  To learn and exhibit compliance  with exercise, home and travel O2 prescription;To learn and understand importance of monitoring SPO2 with pulse oximeter and demonstrate accurate use of the pulse oximeter.;To learn and understand importance of maintaining oxygen saturations>88%;To learn and demonstrate proper pursed lip breathing techniques or other breathing techniques.;To learn and demonstrate proper use of respiratory medications  To learn and exhibit compliance with exercise, home and travel O2 prescription;To learn and understand importance of monitoring SPO2 with pulse oximeter and demonstrate accurate use of the pulse oximeter.;To learn and understand importance of maintaining oxygen saturations>88%;To learn and demonstrate proper pursed lip breathing techniques or other breathing techniques.;To learn and demonstrate proper use of respiratory medications   Long  Term Goals  Verbalizes importance of monitoring SPO2 with pulse oximeter and return demonstration;Exhibits proper breathing techniques, such as pursed lip breathing or other method taught during program session;Compliance with respiratory medication;Maintenance of O2 saturations>88%  Maintenance of O2 saturations>88%;Compliance with respiratory medication;Exhibits compliance with exercise, home and travel O2 prescription;Verbalizes importance of monitoring SPO2 with pulse oximeter and return demonstration;Exhibits proper breathing techniques, such as pursed lip breathing or other method taught during program session  Exhibits compliance with exercise, home and travel O2 prescription;Maintenance of O2 saturations>88%;Verbalizes importance of monitoring SPO2 with pulse oximeter and return demonstration;Compliance with respiratory medication;Exhibits proper breathing techniques, such as pursed lip breathing or other method taught during program session  Exhibits compliance with exercise, home and travel O2 prescription;Verbalizes importance of monitoring SPO2 with pulse oximeter  and return demonstration;Compliance with respiratory medication;Exhibits proper breathing techniques, such as pursed lip breathing or other method taught during program session  Exhibits compliance with exercise, home and travel O2 prescription;Verbalizes importance of monitoring SPO2 with pulse oximeter and return demonstration;Compliance with respiratory medication;Exhibits proper breathing techniques, such as pursed lip breathing or other method taught during program session   Comments  Emry is using her CPAP everynight as instructed. Explained to her that PLB can be used to help catch her breath, also that she can purchase a pulse oximeter to moniter her oxygen at home. Explained to her that  her oxygen should be 88 percent and above.   Pegggs does not have a pulse oximeter at home to check her own oxygen. She states she has been taking her nebulizer and her inhalers as prescribed. She needs to work on PLB techniques while she is in class.   Aleatha states that she takes her nebulizer once a day. She takes her albuterol inhaler PRN and has not used it lately. She does not wear home oxygen but is on a CPAP at night. She states she wears her CPAP every night. Her oxygen has not fallen below 88 percent on any day in [facility].   Markitta is using her respiratory medications properly. She is using her CPAP at night, except when she has sinus issues. She checks her oxygen regularly and is pleased with the improvement.   Kemani is using her CPAP at night and taking her medications as prescribed. She has been checking her oxygen at home and has been in the mid 90's when in class. She has not been using PLB as much as she should. Informed her to use PLB when she gets short of breath in class or at home.   Goals/Expected Outcomes  Short: Work on PLB. Obtain a pulse oximeter. Long Be proficient in checking oxygen saturation and PLB  Short: Be more proficient with PLB. Long: Be independent with PLB.  Short; obtain a pulse  oximeter. Long: check oxygen saturation at home independently.  Short: wear CPAP every night; Long: continue to check oxygen and wear CPAP  Short: practice PLB Long: Use PLB independently.      Oxygen Discharge (Final Oxygen Re-Evaluation): Oxygen Re-Evaluation - 06/12/17 1256      Program Oxygen Prescription   Program Oxygen Prescription  None      Home Oxygen   Home Oxygen Device  None    Sleep Oxygen Prescription  CPAP    Liters per minute  0    Home Exercise Oxygen Prescription  None    Home at Rest Exercise Oxygen Prescription  None    Compliance with Home Oxygen Use  Yes      Goals/Expected Outcomes   Short Term Goals  To learn and exhibit compliance with exercise, home and travel O2 prescription;To learn and understand importance of monitoring SPO2 with pulse oximeter and demonstrate accurate use of the pulse oximeter.;To learn and understand importance of maintaining oxygen saturations>88%;To learn and demonstrate proper pursed lip breathing techniques or other breathing techniques.;To learn and demonstrate proper use of respiratory medications    Long  Term Goals  Exhibits compliance with exercise, home and travel O2 prescription;Verbalizes importance of monitoring SPO2 with pulse oximeter and return demonstration;Compliance with respiratory medication;Exhibits proper breathing techniques, such as pursed lip breathing or other method taught during program session    Comments  Etheleen is using her CPAP at night and taking her medications as prescribed. She has been checking her oxygen at home and has been in the mid 90's when in class. She has not been using PLB as much as she should. Informed her to use PLB when she gets short of breath in class or at home.    Goals/Expected Outcomes  Short: practice PLB Long: Use PLB independently.       Initial Exercise Prescription: Initial Exercise Prescription - 02/27/17 1600      Date of Initial Exercise RX and Referring Provider   Date   02/27/17    Referring Provider  Trellis Moment MD  Treadmill   MPH  1    Grade  0.5    Minutes  15    METs  1.83      NuStep   Level  1    SPM  80    Minutes  15    METs  2      REL-XR   Level  1    Speed  50    Minutes  15    METs  2      Prescription Details   Frequency (times per week)  3    Duration  Progress to 45 minutes of aerobic exercise without signs/symptoms of physical distress      Intensity   THRR 40-80% of Max Heartrate  103-134    Ratings of Perceived Exertion  11-13    Perceived Dyspnea  0-4      Progression   Progression  Continue to progress workloads to maintain intensity without signs/symptoms of physical distress.      Resistance Training   Training Prescription  Yes    Weight  3 lbs       Perform Capillary Blood Glucose checks as needed.  Exercise Prescription Changes: Exercise Prescription Changes    Row Name 02/27/17 1600 03/14/17 1300 03/15/17 1300 03/29/17 1400 04/11/17 1600     Response to Exercise   Blood Pressure (Admit)  128/66  132/78  -  118/62  114/64   Blood Pressure (Exercise)  152/80  138/76  -  -  -   Blood Pressure (Exit)  134/74  128/64  -  128/70  128/80   Heart Rate (Admit)  72 bpm  78 bpm  -  72 bpm  70 bpm   Heart Rate (Exercise)  98 bpm  86 bpm  -  96 bpm  95 bpm   Heart Rate (Exit)  76 bpm  70 bpm  -  76 bpm  85 bpm   Oxygen Saturation (Admit)  99 %  95 %  -  96 %  95 %   Oxygen Saturation (Exercise)  90 %  96 %  -  94 %  97 %   Oxygen Saturation (Exit)  95 %  94 %  -  93 %  97 %   Rating of Perceived Exertion (Exercise)  9  11  -  13  13   Perceived Dyspnea (Exercise)  0  0  -  0  0   Symptoms  none  none  -  none  none   Comments  walk test results, wearing slides, using cane  second day full day of exercise  -  -  -   Duration  -  Progress to 45 minutes of aerobic exercise without signs/symptoms of physical distress  -  Continue with 45 min of aerobic exercise without signs/symptoms of physical distress.   Continue with 45 min of aerobic exercise without signs/symptoms of physical distress.   Intensity  -  THRR unchanged  -  THRR unchanged  THRR unchanged     Progression   Progression  -  Continue to progress workloads to maintain intensity without signs/symptoms of physical distress.  -  Continue to progress workloads to maintain intensity without signs/symptoms of physical distress.  Continue to progress workloads to maintain intensity without signs/symptoms of physical distress.   Average METs  -  1.87  -  2.57  2.55     Resistance Training   Training Prescription  -  Yes  -  Yes  Yes   Weight  -  3 lbs  -  3 lbs  3 lbs   Reps  -  10-15  -  10-15  10-15     Interval Training   Interval Training  -  No  -  No  No     Treadmill   MPH  -  0  -  1.5  1.5   Grade  -  0.5  -  1.5  1.5   Minutes  -  15  -  15  15   METs  -  1.83  -  2.46  2.46     NuStep   Level  -  1  -  2  5   SPM  -  60  -  70  81   Minutes  -  15  -  15  15   METs  -  1.7  -  2.9  2.2     REL-XR   Level  -  1  -  1  1   Speed  -  40  -  44  42   Minutes  -  15  -  15  15   METs  -  2.1  -  2.4  3     Home Exercise Plan   Plans to continue exercise at  -  -  [employer] (comment) [employer] (comment) [employer] (comment) [employer]   Frequency  -  -  Add 1 additional day to program exercise sessions.  Add 1 additional day to program exercise sessions.  Add 1 additional day to program exercise sessions.   Initial Home Exercises Provided  -  -  03/15/17  03/15/17  03/15/17   Row Name 04/25/17 1400 05/09/17 1600 05/23/17 1600 06/07/17 1400       Response to Exercise   Blood Pressure (Admit)  124/64  130/70  132/62  128/64    Blood Pressure (Exit)  126/74  126/68  126/70  124/60    Heart Rate (Admit)  72 bpm  70 bpm  76 bpm  79 bpm    Heart Rate (Exercise)  106 bpm  102 bpm  120 bpm  98 bpm    Heart Rate (Exit)  70 bpm  70 bpm  88 bpm  77 bpm    Oxygen  Saturation (Admit)  98 %  90 %  100 %  100 %    Oxygen Saturation (Exercise)  94 %  100 %  95 %  95 %    Oxygen Saturation (Exit)  97 %  99 %  100 %  99 %    Rating of Perceived Exertion (Exercise)  _0 Perceived Dyspnea (Exercise)  3  0  0  1    Symptoms  SOB  none  none  none    Duration  Continue with 45 min of aerobic exercise without signs/symptoms of physical distress.  Continue with 45 min of aerobic exercise without signs/symptoms of physical distress.  Continue with 45 min of aerobic exercise without signs/symptoms of physical distress.  Continue with 45 min of aerobic exercise without signs/symptoms of physical distress.    Intensity  THRR unchanged  THRR unchanged  THRR unchanged  THRR unchanged      Progression   Progression  Continue to progress workloads to maintain intensity without signs/symptoms of  physical distress.  Continue to progress workloads to maintain intensity without signs/symptoms of physical distress.  Continue to progress workloads to maintain intensity without signs/symptoms of physical distress.  Continue to progress workloads to maintain intensity without signs/symptoms of physical distress.    Average METs  2.85  2.79  2.89  2.38      Resistance Training   Training Prescription  Yes  Yes  Yes  Yes    Weight  3 lbs  3 lbs  4 lbs  4 lbs    Reps  10-15  10-15  10-15  10-15      Interval Training   Interval Training  No  No  No  No      Treadmill   MPH  1.5  1.5  1.5  1.5    Grade  1.5  1.5  1.5  0    Minutes  _0 METs  2.46  2.46  2.46  2.15      NuStep   Level  _1 SPM  72  72  64  60    Minutes  _2 METs  3  3  2.7  3.1      REL-XR   Level  _3 Speed  48  49  51  38    Minutes  _4 METs  3.1  3  3.5  1.9      Home Exercise Plan   Plans to continue exercise at  [employer] (comment) [employer] (comment) [employer]  (comment) [employer] (comment) [employer]    Frequency  Add 1 additional day to program exercise sessions.  Add 2 additional days to program exercise sessions.  Add 2 additional days to program exercise sessions.  Add 2 additional days to program exercise sessions.    Initial Home Exercises Provided  03/15/17  03/15/17  03/15/17  03/15/17       Exercise Comments: Exercise Comments    Row Name 03/06/17 1157 05/10/17 1454         Exercise Comments  First full day of exercise!  Patient was oriented to gym and equipment including functions, settings, policies, and procedures.  Patient's individual exercise prescription and treatment plan were reviewed.  All starting workloads were established based on the results of the 6 minute walk test done at initial orientation visit.  The plan for exercise progression was also introduced and progression will be customized based on patient's performance and goals.  Leilah would like to continue to work on the stepper or steps like we did today for weights.  She would like to get stronger on the steps and we will start making this part of her routine.          Exercise Goals and Review: Exercise Goals    Row Name 02/27/17 1643             Exercise Goals   Increase Physical Activity  Yes       Intervention  Provide advice, education, support and counseling about physical activity/exercise needs.;Develop an individualized exercise prescription for aerobic and resistive training based on initial evaluation findings, risk stratification, comorbidities and participant's personal goals.  Expected Outcomes  Achievement of increased cardiorespiratory fitness and enhanced flexibility, muscular endurance and strength shown through measurements of functional capacity and personal statement of participant.       Increase Strength and Stamina  Yes       Intervention  Provide advice, education, support and counseling about physical  activity/exercise needs.;Develop an individualized exercise prescription for aerobic and resistive training based on initial evaluation findings, risk stratification, comorbidities and participant's personal goals.       Expected Outcomes  Achievement of increased cardiorespiratory fitness and enhanced flexibility, muscular endurance and strength shown through measurements of functional capacity and personal statement of participant.       Able to understand and use rate of perceived exertion (RPE) scale  Yes       Intervention  Provide education and explanation on how to use RPE scale       Expected Outcomes  Short Term: Able to use RPE daily in rehab to express subjective intensity level;Long Term:  Able to use RPE to guide intensity level when exercising independently       Able to understand and use Dyspnea scale  Yes       Intervention  Provide education and explanation on how to use Dyspnea scale       Expected Outcomes  Short Term: Able to use Dyspnea scale daily in rehab to express subjective sense of shortness of breath during exertion;Long Term: Able to use Dyspnea scale to guide intensity level when exercising independently       Knowledge and understanding of Target Heart Rate Range (THRR)  Yes       Intervention  Provide education and explanation of THRR including how the numbers were predicted and where they are located for reference       Expected Outcomes  Short Term: Able to state/look up THRR;Long Term: Able to use THRR to govern intensity when exercising independently;Short Term: Able to use daily as guideline for intensity in rehab       Able to check pulse independently  Yes       Intervention  Provide education and demonstration on how to check pulse in carotid and radial arteries.;Review the importance of being able to check your own pulse for safety during independent exercise       Expected Outcomes  Short Term: Able to explain why pulse checking is important during independent  exercise;Long Term: Able to check pulse independently and accurately       Understanding of Exercise Prescription  Yes       Intervention  Provide education, explanation, and written materials on patient's individual exercise prescription       Expected Outcomes  Short Term: Able to explain program exercise prescription;Long Term: Able to explain home exercise prescription to exercise independently          Exercise Goals Re-Evaluation : Exercise Goals Re-Evaluation    Row Name 03/14/17 1323 03/15/17 1259 03/29/17 1442 04/11/17 1557 04/25/17 1414     Exercise Goal Re-Evaluation   Exercise Goals Review  Increase Physical Activity;Increase Strength and Stamina  Increase Physical Activity;Able to understand and use Dyspnea scale;Understanding of Exercise Prescription;Knowledge and understanding of Target Heart Rate Range (THRR);Increase Strength and Stamina;Able to understand and use rate of perceived exertion (RPE) scale;Able to check pulse independently  Increase Physical Activity;Increase Strength and Stamina  Increase Physical Activity;Increase Strength and Stamina;Understanding of Exercise Prescription  Increase Physical Activity;Increase Strength and Stamina;Understanding of Exercise Prescription   Comments  Ying is  off to a good start with rehab.  She has completed two full days of exercise.  She is now up to 5 min intervals on the treadmill.  We will continue to monitor her progress.  Reviewed home exercise with pt today.  Pt plans to walk and use the cross trainer at [employer] for exercise.  Reviewed THR, pulse, RPE, sign and symptoms, NTG use, and when to call 911 or MD.  Also discussed weather considerations and indoor options.  Pt voiced understanding  Khristi is off to a great start in rehab.  She is now up to 1.5 mph on the treadmill.  We will continue to monitor her progression.   [place] has continued to do well in rehab.  She has finally cut down her nail to be able to monitor her oxygen  saturation with probe.  She is now up to level 5 on the NuStep.  We will continue monitor her progression.  Greenly has been doing well in rehab.  She is now up to 1.56mh on the treadmill and level 6 on the NuStep.  She is really getting moving!!  We will continue to monitor her progress.    Expected Outcomes  Short: Continue to build up stamina on treadmill.  Long: Continue to increase physical activity.   Short: adding one extra day of exercise a week. Long: Maintain a workout routine independently post [facility].  Short: Increase workloads on XR and NuStep.  Long: Get into routine of regular exercise.   Short: Try to increase spms on both XR and NuStep.  Long: Continue to exercise on her own.   Short: Continue to try to increase workload on XR.  Long: Continue to exercise regularly.    RGarrisonName 05/09/17 1535 05/23/17 1521 06/07/17 1458         Exercise Goal Re-Evaluation   Exercise Goals Review  Increase Physical Activity;Increase Strength and Stamina;Understanding of Exercise Prescription  Increase Physical Activity;Increase Strength and Stamina;Understanding of Exercise Prescription  Increase Physical Activity;Increase Strength and Stamina;Understanding of Exercise Prescription     Comments  PShaleehas continued to do well in rehab.  She gets excited when she is able to get her vitals herself now.  She has increased her rpms on the XR to 49!  We will continue to monitor her progression.   PAthalenecontinues to do well in rehab.  She is now up to level 3 on the XR and maintaining her rpms.  We will try to increase her more on the treadmill and continue to monitor her progression.   PJeneenhas been out since 12/20 but returned today.  She was out with the holidays and then was also sick some.  She was able to return to slightly reduced workloads without any problems.  We talked about increase her treadmill again on Friday.  We will continue to monitor her progression.      Expected Outcomes  Short: Continue to  increase workloads.  Long: Continue to exercise regularly.   Short: Increase workload on treadmill.   Long: Continue to increase physical activity.   Short: Increase workload on treadmill.  Long: Continue to exercise more at home.         Discharge Exercise Prescription (Final Exercise Prescription Changes): Exercise Prescription Changes - 06/07/17 1400      Response to Exercise   Blood Pressure (Admit)  128/64    Blood Pressure (Exit)  124/60    Heart Rate (Admit)  79 bpm  Heart Rate (Exercise)  98 bpm    Heart Rate (Exit)  77 bpm    Oxygen Saturation (Admit)  100 %    Oxygen Saturation (Exercise)  95 %    Oxygen Saturation (Exit)  99 %    Rating of Perceived Exertion (Exercise)  13    Perceived Dyspnea (Exercise)  1    Symptoms  none    Duration  Continue with 45 min of aerobic exercise without signs/symptoms of physical distress.    Intensity  THRR unchanged      Progression   Progression  Continue to progress workloads to maintain intensity without signs/symptoms of physical distress.    Average METs  2.38      Resistance Training   Training Prescription  Yes    Weight  4 lbs    Reps  10-15      Interval Training   Interval Training  No      Treadmill   MPH  1.5    Grade  0    Minutes  15    METs  2.15      NuStep   Level  6    SPM  60    Minutes  15    METs  3.1      REL-XR   Level  3    Speed  38    Minutes  15    METs  1.9      Home Exercise Plan   Plans to continue exercise at  [employer] (comment) [employer]    Frequency  Add 2 additional days to program exercise sessions.    Initial Home Exercises Provided  03/15/17       Nutrition:  Target Goals: Understanding of nutrition guidelines, daily intake of sodium <1534m, cholesterol <2012m calories 30% from fat and 7% or less from saturated fats, daily to have 5 or more servings of fruits and vegetables.  Biometrics: Pre Biometrics - 02/27/17 1643      Pre Biometrics   Height  5'  7.5" (1.715 m)    Weight  151 lb 8 oz (68.7 kg)    Waist Circumference  33 inches    Hip Circumference  36.75 inches    Waist to Hip Ratio  0.9 %    BMI (Calculated)  23.36        Nutrition Therapy Plan and Nutrition Goals: Nutrition Therapy & Goals - 04/10/17 1006      Nutrition Therapy   Diet  Instructed on a meal plan including dietary guidelines for lung health and diabetes    Drug/Food Interactions  Statins/Certain Fruits    Protein (specify units)  6    Fiber  20 grams    Whole Grain Foods  3 servings    Saturated Fats  12 max. grams    Fruits and Vegetables  5 servings/day    Sodium  1500 grams      Personal Nutrition Goals   Nutrition Goal  Measure out some starchy foods. Count 1 cup serving as 2 servings of carbohydrate.    Personal Goal #2  Balance meals with protein, 2-3 servings of carbohydrate and non-starchy vegetables.    Personal Goal #3  Read labels for sodium.    Personal Goal #4  Limit sugar sweetened beverages.      Intervention Plan   Intervention  Prescribe, educate and counsel regarding individualized specific dietary modifications aiming towards targeted core components such as weight, hypertension, lipid management, diabetes, heart failure  and other comorbidities.;Nutrition handout(s) given to patient.    Expected Outcomes  Short Term Goal: Understand basic principles of dietary content, such as calories, fat, sodium, cholesterol and nutrients.;Short Term Goal: A plan has been developed with personal nutrition goals set during dietitian appointment.;Long Term Goal: Adherence to prescribed nutrition plan.       Nutrition Discharge: Rate Your Plate Scores: Nutrition Assessments - 02/27/17 1614      MEDFICTS Scores   Pre Score  65       Nutrition Goals Re-Evaluation: Nutrition Goals Re-Evaluation    Row Name 03/17/17 1247 05/03/17 1226 06/12/17 1259         Goals   Current Weight  152 lb 12.8 oz (69.3 kg)  -  161 lb (73 kg)     Nutrition Goal   Eat healthier and adhere to a diet plan  Novalie set a nutrition goal with the nutritionist where she learned about measuring food, reading labels, limiting sugar beverages and eating balanced meals. Her goal is to adhere to this plan  Limit sugar and watch sodium     Comment  Jozette has a nutrition appointment November 5th.  -  Peggs has been eating better and her diabetes is more under control.     Expected Outcome  Short: Meet with the dietician. Long: Adhere to a nutrition plan  Short: continue to read food labels, eat  balanced meals, and limit sugar beverages. Long: adhere to her nutrition plan   Short: limit sugar and sodium Long: maintain a lower sugar and sodium diet.         Nutrition Goals Discharge (Final Nutrition Goals Re-Evaluation): Nutrition Goals Re-Evaluation - 06/12/17 1259      Goals   Current Weight  161 lb (73 kg)    Nutrition Goal  Limit sugar and watch sodium    Comment  Peggs has been eating better and her diabetes is more under control.    Expected Outcome  Short: limit sugar and sodium Long: maintain a lower sugar and sodium diet.        Psychosocial: Target Goals: Acknowledge presence or absence of significant depression and/or stress, maximize coping skills, provide positive support system. Participant is able to verbalize types and ability to use techniques and skills needed for reducing stress and depression.   Initial Review & Psychosocial Screening: Initial Psych Review & Screening - 02/27/17 1511      Initial Review   Current issues with  Current Depression;Current Stress Concerns;Current Anxiety/Panic    Source of Stress Concerns  Chronic Illness;Family    Comments  Relationship not well with her husband      [place]?  Yes    Comments  Daughters show her support      Barriers   Psychosocial barriers to participate in program  The patient should benefit from training in stress management and relaxation.      Screening  Interventions   Interventions  Yes;To provide support and resources with identified psychosocial needs;Provide feedback about the scores to participant;Encouraged to exercise;Program counselor consult    Expected Outcomes  Short Term goal: Utilizing psychosocial counselor, staff and physician to assist with identification of specific Stressors or current issues interfering with healing process. Setting desired goal for each stressor or current issue identified.;Long Term Goal: Stressors or current issues are controlled or eliminated.;Short Term goal: Identification and review with participant of any Quality of Life or Depression concerns found by scoring the questionnaire.;Long Term  goal: The participant improves quality of Life and PHQ9 Scores as seen by post scores and/or verbalization of changes       Quality of Life Scores:   PHQ-9: Recent Review Flowsheet Data    Depression screen [facility] 2/9 05/01/2017 02/27/2017 11/29/2016 11/29/2016 07/22/2015   Decreased Interest _0 0   Down, Depressed, Hopeless _1 0   PHQ - 2 Score _2 0   Altered sleeping 0 0 3 3 -   Tired, decreased energy _3 -   Change in appetite _4 -   Feeling bad or failure about yourself  _5 -   Trouble concentrating _6 -   Moving slowly or fidgety/restless 0 0 0 0 -   Suicidal thoughts 0 _7 -   PHQ-9 Score _8 -   Difficult doing work/chores Not difficult at all Somewhat difficult - - -     Interpretation of Total Score  Total Score Depression Severity:  1-4 = Minimal depression, 5-9 = Mild depression, 10-14 = Moderate depression, 15-19 = Moderately severe depression, 20-27 = Severe depression   Psychosocial Evaluation and Intervention: Psychosocial Evaluation - 03/27/17 1240      Psychosocial Evaluation & Interventions   Interventions  Encouraged to exercise with the program and follow exercise prescription;Therapist referral;Relaxation education;Stress management education      Comments  Counselor met with Ms. Georgann Housekeeper [facility]) for initial psychosocial evaluation.  She is a 72 year old who has pulmonary hypertension.  Ellora has a strong support system with a daughter and son who live locally.  She is also married but reports her marriage is not positive and he hasn't been supportive of her for the past 10 years.  Shynia struggles with diabetes and sleep apnea and has a CPAP for the past 2 months to help with sleep - but she reports it does not.  She admits to a history of depression and anxiety for approximately 5-6 years.  She states her mood is negative most of the time and she is "unhappy" currently.  Melvina's primary stress is her marriage and her health.  Counselor assessed [place] for SI/HI due to the report on the PHQ-9 of "18" indicating "moderately severe" for depression and the answer to #9 with "thoughts of being better off dead or hurting yourself in some way" answered a "2" meaning she has these thoughts more than half the days.  Caron states she will never hurt herself because she has (6) grandchildren that she adores and brings her joy.  Counselor encouraged Terrina see a therapist in the near future and she reports she has a new contact that her insurance will cover now.  She agreed to contact this provider and get on their schedule asap.  Nathalee also feels that her medications are too strong and she is seeing the Dr. about this on Thursday with hopes that some will be discontinued.  Counselor may recommend a psychiatrist if her mood does not improve in the near future due to her PHQ scores and her answer to #9.  Counselor will follow with Gitty throughout the course of this program.     Expected Outcomes  Aleanna will benefit from consistent exercise to achieve her stated goals to "get thru this program."  She will also benefit from the educational and psychoeducational components of this program to learn improved coping strategies to help  with her mood and stress level.  Malayjah  will contact and begin seeing a therpist in the near future.  She will speak to her Dr. about medication changes as well.  Counselor will follow.    Continue Psychosocial Services   Follow up required by counselor       Psychosocial Re-Evaluation: Psychosocial Re-Evaluation    [place] Name 03/24/17 1405 04/07/17 1248 05/01/17 1413 05/03/17 1246 05/08/17 1210     Psychosocial Re-Evaluation   Current issues with  Current Depression;Current Stress Concerns  Current Depression;Current Stress Concerns  Current Stress Concerns;Current Depression  -  -   Comments  Amillya states that her home life has not changed since the start of the program. She states that if she could live alone she would. Peggs states she does not get along with her husband. She seems really depressed as of late. She states that she has a doctors appointment for her depression.  Lawrencia states that her husband is more like a roomate. She does not tell the family about there problems becuase she feels strong enough that she does not need to. She states this has been going on for many years. She comes to [place] becuase her doctor and her children want her to. She has been seeing a therapist but she will not be able to see her anymore due to her insurance changing. She pays for [employer] but does not go.  Informed her that it would be good to exercise when the program is over to maintain fitness and decrease stress.  Rosalynd states he has been doing better lately and has paid her bills. She states that she has not paid her bills in 7 months and she feels really good that she paid them recently. Her daughter helps pay the bills and she said her daughter was so happy for her. Informed her that she was doing a great job and that is a Film/video editor.  Counselor follow up with Shamila today reporting some improvement in her mood - although still has depressive symptoms.   Kashonda states she has not called a Social worker or psychiatrist yet but if  planning to do so this week - since Thanksgiving is over.  She enjoys this program and is feeling some benefit from it.    Follow up by this Counselor with Vickii Chafe to see if has reached out to her counselor yet.  She has tried - but no response so she plans to call a former female counselor and pay out of pocket - if she has to.  She is concerned about the steps to get to that office.  Counselor offered to research counselors who will take her health insurance and she seemed delighted.  Counselor provided [employer] with a list of (2) counselors locally who take [employer].  She agreed to call today to set an appointment.  Tayte states this program has been a good stress reducer for her.  Counselor assessed other coping strategies with relying on her faith; getting her hair and nails done and exercise.  She continues to report her stress and living situation is depressing and she has lived with it so long - she doesn't think it will change or that it is harming her in any way.  Counselor educated Clinical cytogeneticist on this and that is when she agreed to reach out to a Social worker.  She is more hesitant to consider a psychiatrist or medications at this time.  Counselor will follow.    Expected Outcomes  Short: Meet with the mental health counselor. Long: decrease PHQ9 scores.  Short:continue [facility] to decrease stress. Long: see a therapist to help cope with depression.  Short:continue financial independence. Long: continue budgeting to maintain financial balance.  Yazmen will call a counselor or psychiatrist to help with her depressive symptoms that are still evident.  She will continue to exercise consistently for her mood and overall health.  Jonnae was given two counselor locally who take her insurance to call. She agreed to reach out to one of them this week.  She will continue to exercise consistently.     Interventions  Encouraged to attend Pulmonary Rehabilitation for the exercise;Stress management education  Encouraged to  attend Pulmonary Rehabilitation for the exercise  Encouraged to attend Pulmonary Rehabilitation for the exercise  -  Stress management education;Therapist referral   Continue Psychosocial Services   Follow up required by counselor  Follow up required by counselor  Follow up required by staff  Follow up required by staff  Follow up required by staff   [place] Name 06/12/17 1539             Psychosocial Re-Evaluation   Current issues with  Current Stress Concerns;Current Depression       Comments  Letishia states that things at home are the same. She wants a divorce from her husband but states she cant becuase she cannot afford to live on her own. Her holidays were good and she gets along with her kids very well. She still says that her and her husband are just roommates.       Expected Outcomes  Short: Attend [facility] to reduce stress. Long: maintain an exercise program post [facility] to decrease stress.       Interventions  Encouraged to attend Pulmonary Rehabilitation for the exercise       Continue Psychosocial Services   Follow up required by staff          Psychosocial Discharge (Final Psychosocial Re-Evaluation): Psychosocial Re-Evaluation - 06/12/17 1539      Psychosocial Re-Evaluation   Current issues with  Current Stress Concerns;Current Depression    Comments  Tywanna states that things at home are the same. She wants a divorce from her husband but states she cant becuase she cannot afford to live on her own. Her holidays were good and she gets along with her kids very well. She still says that her and her husband are just roommates.    Expected Outcomes  Short: Attend [facility] to reduce stress. Long: maintain an exercise program post [facility] to decrease stress.    Interventions  Encouraged to attend Pulmonary Rehabilitation for the exercise    Continue Psychosocial Services   Follow up required by staff       Education: Education Goals: Education classes will be provided on a  weekly basis, covering required topics. Participant will state understanding/return demonstration of topics presented.  Learning Barriers/Preferences: Learning Barriers/Preferences - 02/27/17 1643      Learning Barriers/Preferences   Learning Barriers  None    Learning Preferences  None       Education Topics: Initial Evaluation Education: - Verbal, written and demonstration of respiratory meds, RPE/PD scales, oximetry and breathing techniques. Instruction on use of nebulizers and MDIs: cleaning and proper use, rinsing mouth with steroid doses and importance of monitoring MDI activations.   Pulmonary Rehab from 06/14/2017 in [facility] Cardiac and Pulmonary Rehab  Date  02/27/17  Educator  [facility]  Instruction Review Code  1- Verbalizes Understanding  General Nutrition Guidelines/Fats and Fiber: -Group instruction provided by verbal, written material, models and posters to present the general guidelines for heart healthy nutrition. Gives an explanation and review of dietary fats and fiber.   Pulmonary Rehab from 06/14/2017 in [facility] Cardiac and Pulmonary Rehab  Date  02/27/17  Educator  [facility]  Instruction Review Code  1- Verbalizes Understanding      Controlling Sodium/Reading Food Labels: -Group verbal and written material supporting the discussion of sodium use in heart healthy nutrition. Review and explanation with models, verbal and written materials for utilization of the food label.   Pulmonary Rehab from 06/14/2017 in [facility] Cardiac and Pulmonary Rehab  Date  05/22/17  Educator  CR  Instruction Review Code  1- Verbalizes Understanding      Exercise Physiology & Risk Factors: - Group verbal and written instruction with models to review the exercise physiology of the cardiovascular system and associated critical values. Details cardiovascular disease risk factors and the goals associated with each risk factor.   Aerobic Exercise & Resistance Training: - Gives group verbal and written  discussion on the health impact of inactivity. On the components of aerobic and resistive training programs and the benefits of this training and how to safely progress through these programs.   Pulmonary Rehab from 06/14/2017 in [facility] Cardiac and Pulmonary Rehab  Date  04/07/17  Educator  [facility]  Instruction Review Code  1- Verbalizes Understanding      Flexibility, Balance, General Exercise Guidelines: - Provides group verbal and written instruction on the benefits of flexibility and balance training programs. Provides general exercise guidelines with specific guidelines to those with heart or lung disease. Demonstration and skill practice provided.   Stress Management: - Provides group verbal and written instruction about the health risks of elevated stress, cause of high stress, and healthy ways to reduce stress.   Depression: - Provides group verbal and written instruction on the correlation between heart/lung disease and depressed mood, treatment options, and the stigmas associated with seeking treatment.   Pulmonary Rehab from 06/14/2017 in [facility] Cardiac and Pulmonary Rehab  Date  04/19/17  Educator  [facility]  Instruction Review Code  1- Verbalizes Understanding      Exercise & Equipment Safety: - Individual verbal instruction and demonstration of equipment use and safety with use of the equipment.   Infection Prevention: - Provides verbal and written material to individual with discussion of infection control including proper hand washing and proper equipment cleaning during exercise session.   Pulmonary Rehab from 06/14/2017 in [facility] Cardiac and Pulmonary Rehab  Date  02/27/17  Educator  [facility]  Instruction Review Code  1- Verbalizes Understanding      Falls Prevention: - Provides verbal and written material to individual with discussion of falls prevention and safety.   Pulmonary Rehab from 06/14/2017 in [facility] Cardiac and Pulmonary Rehab  Date  02/27/17  Educator  [facility]  Instruction Review  Code  1- Verbalizes Understanding      Diabetes: - Individual verbal and written instruction to review signs/symptoms of diabetes, desired ranges of glucose level fasting, after meals and with exercise. Advice that pre and post exercise glucose checks will be done for 3 sessions at entry of program.   Chronic Lung Diseases: - Group verbal and written instruction to review new updates, new respiratory medications, new advancements in procedures and treatments. Provide informative websites and "800" numbers of self-education.   Pulmonary Rehab from 06/14/2017 in [facility] Cardiac and Pulmonary Rehab  Date  06/07/17  Educator  [facility]  Instruction Review Code  1- Verbalizes Understanding      Lung Procedures: - Group verbal and written instruction to describe testing methods done to diagnose lung disease. Review the outcome of test results. Describe the treatment choices: Pulmonary Function Tests, ABGs and oximetry.   Pulmonary Rehab from 06/14/2017 in [facility] Cardiac and Pulmonary Rehab  Date  05/10/17  Educator  [facility]  Instruction Review Code  1- Verbalizes Understanding      Energy Conservation: - Provide group verbal and written instruction for methods to conserve energy, plan and organize activities. Instruct on pacing techniques, use of adaptive equipment and posture/positioning to relieve shortness of breath.   Pulmonary Rehab from 06/14/2017 in [facility] Cardiac and Pulmonary Rehab  Date  05/03/17  Educator  [facility]  Instruction Review Code  1- Verbalizes Understanding      Triggers: - Group verbal and written instruction to review types of environmental controls: home humidity, furnaces, filters, dust mite/pet prevention, HEPA vacuums. To discuss weather changes, air quality and the benefits of nasal washing.   Exacerbations: - Group verbal and written instruction to provide: warning signs, infection symptoms, calling MD promptly, preventive modes, and value of vaccinations. Review: effective airway  clearance, coughing and/or vibration techniques. Create an Sports administrator.   Oxygen: - Individual and group verbal and written instruction on oxygen therapy. Includes supplement oxygen, available portable oxygen systems, continuous and intermittent flow rates, oxygen safety, concentrators, and Medicare reimbursement for oxygen.   Respiratory Medications: - Group verbal and written instruction to review medications for lung disease. Drug class, frequency, complications, importance of spacers, rinsing mouth after steroid MDI's, and proper cleaning methods for nebulizers.   AED/CPR: - Group verbal and written instruction with the use of models to demonstrate the basic use of the AED with the basic ABC's of resuscitation.   Breathing Retraining: - Provides individuals verbal and written instruction on purpose, frequency, and proper technique of diaphragmatic breathing and pursed-lipped breathing. Applies individual practice skills.   Pulmonary Rehab from 06/14/2017 in [facility] Cardiac and Pulmonary Rehab  Date  02/27/17  Educator  [facility]  Instruction Review Code  1- Verbalizes Understanding      Anatomy and Physiology of the Lungs: - Group verbal and written instruction with the use of models to provide basic lung anatomy and physiology related to function, structure and complications of lung disease.   Pulmonary Rehab from 06/14/2017 in [facility] Cardiac and Pulmonary Rehab  Date  05/10/17  Educator  [facility]  Instruction Review Code  1- Verbalizes Understanding      Anatomy & Physiology of the Heart: - Group verbal and written instruction and models provide basic cardiac anatomy and physiology, with the coronary electrical and arterial systems. Review of: AMI, Angina, Valve disease, Heart Failure, Cardiac Arrhythmia, Pacemakers, and the ICD.   Pulmonary Rehab from 06/14/2017 in [facility] Cardiac and Pulmonary Rehab  Date  04/21/17  Educator  [facility]  Instruction Review Code  1- Verbalizes Understanding       Heart Failure: - Group verbal and written instruction on the basics of heart failure: signs/symptoms, treatments, explanation of ejection fraction, enlarged heart and cardiomyopathy.   Pulmonary Rehab from 06/14/2017 in [facility] Cardiac and Pulmonary Rehab  Date  04/21/17  Educator  [place]  Instruction Review Code  1- Verbalizes Understanding      Sleep Apnea: - Individual verbal and written instruction to review Obstructive Sleep Apnea. Review of risk factors, methods for diagnosing and types of masks and machines for  OSA.   Pulmonary Rehab from 06/14/2017 in [facility] Cardiac and Pulmonary Rehab  Date  02/27/17  Educator  [facility]  Instruction Review Code  1- Verbalizes Understanding      Anxiety: - Provides group, verbal and written instruction on the correlation between heart/lung disease and anxiety, treatment options, and management of anxiety.   Relaxation: - Provides group, verbal and written instruction about the benefits of relaxation for patients with heart/lung disease. Also provides patients with examples of relaxation techniques.   Pulmonary Rehab from 06/14/2017 in [facility] Cardiac and Pulmonary Rehab  Date  03/22/17  Educator  [facility]  Instruction Review Code  1- Verbalizes Understanding      Cardiac Medications: - Group verbal and written instruction to review commonly prescribed medications for heart disease. Reviews the medication, class of the drug, and side effects.   Pulmonary Rehab from 06/14/2017 in [facility] Cardiac and Pulmonary Rehab  Date  05/12/17  Educator  [place]  Instruction Review Code  1- Verbalizes Understanding      Know Your Numbers: -Group verbal and written instruction about important numbers in your health.  Review of Cholesterol, Blood Pressure, Diabetes, and BMI and the role they play in your overall health.   Pulmonary Rehab from 06/14/2017 in [facility] Cardiac and Pulmonary Rehab  Date  06/14/17  Educator  [facility]  Instruction Review Code  1- Verbalizes Understanding       Other: -Provides group and verbal instruction on various topics (see comments)    Knowledge Questionnaire Score: Knowledge Questionnaire Score - 02/27/17 1519      Knowledge Questionnaire Score   Pre Score  8/10        Core Components/Risk Factors/Patient Goals at Admission: Personal Goals and Risk Factors at Admission - 02/27/17 1526      Core Components/Risk Factors/Patient Goals on Admission    Weight Management  Weight Maintenance;Yes    Intervention  Weight Management: Develop a combined nutrition and exercise program designed to reach desired caloric intake, while maintaining appropriate intake of nutrient and fiber, sodium and fats, and appropriate energy expenditure required for the weight goal.;Weight Management: Provide education and appropriate resources to help participant work on and attain dietary goals.;Weight Management/Obesity: Establish reasonable short term and long term weight goals.    Admit Weight  151 lb 8 oz (68.7 kg)    Goal Weight: Short Term  146 lb (66.2 kg)    Goal Weight: Long Term  150 lb (68 kg)    Expected Outcomes  Short Term: Continue to assess and modify interventions until short term weight is achieved;Long Term: Adherence to nutrition and physical activity/exercise program aimed toward attainment of established weight goal;Weight Maintenance: Understanding of the daily nutrition guidelines, which includes 25-35% calories from fat, 7% or less cal from saturated fats, less than 22m cholesterol, less than 1.5gm of sodium, & 5 or more servings of fruits and vegetables daily;Understanding recommendations for meals to include 15-35% energy as protein, 25-35% energy from fat, 35-60% energy from carbohydrates, less than 2016mof dietary cholesterol, 20-35 gm of total fiber daily;Understanding of distribution of calorie intake throughout the day with the consumption of 4-5 meals/snacks    Diabetes  Yes    Intervention  Provide education about  signs/symptoms and action to take for hypo/hyperglycemia.;Provide education about proper nutrition, including hydration, and aerobic/resistive exercise prescription along with prescribed medications to achieve blood glucose in normal ranges: Fasting glucose 65-99 mg/dL    Expected Outcomes  Short Term: Participant verbalizes understanding of the signs/symptoms  and immediate care of hyper/hypoglycemia, proper foot care and importance of medication, aerobic/resistive exercise and nutrition plan for blood glucose control.;Long Term: Attainment of HbA1C < 7%.    Hypertension  Yes    Intervention  Provide education on lifestyle modifcations including regular physical activity/exercise, weight management, moderate sodium restriction and increased consumption of fresh fruit, vegetables, and low fat dairy, alcohol moderation, and smoking cessation.;Monitor prescription use compliance.    Expected Outcomes  Short Term: Continued assessment and intervention until BP is < 140/36m HG in hypertensive participants. < 130/857mHG in hypertensive participants with diabetes, heart failure or chronic kidney disease.;Long Term: Maintenance of blood pressure at goal levels.    Stress  Yes    Intervention  Offer individual and/or small group education and counseling on adjustment to heart disease, stress management and health-related lifestyle change. Teach and support self-help strategies.;Refer participants experiencing significant psychosocial distress to appropriate mental health specialists for further evaluation and treatment. When possible, include family members and significant others in education/counseling sessions.    Expected Outcomes  Short Term: Participant demonstrates changes in health-related behavior, relaxation and other stress management skills, ability to obtain effective social support, and compliance with psychotropic medications if prescribed.;Long Term: Emotional wellbeing is indicated by absence of  clinically significant psychosocial distress or social isolation.       Core Components/Risk Factors/Patient Goals Review:  Goals and Risk Factor Review    Row Name 03/24/17 1358 04/12/17 1226 05/03/17 1222 05/24/17 1240 06/12/17 1254     Core Components/Risk Factors/Patient Goals Review   Personal Goals Review  Weight Management/Obesity;Improve shortness of breath with ADL's;Diabetes;Stress;Hypertension  Weight Management/Obesity;Stress;Diabetes;Hypertension  Stress;Diabetes;Hypertension;Weight Management/Obesity  Diabetes  Diabetes;Weight Management/Obesity;Improve shortness of breath with ADL's   Review  PeTamikkaas had alot of stress dealing with her home situation. She states her husband and her do not get along and she would rather not be living with him. She states her diabetes has been under control and she checks her levels at home.  Her ADL's have not  improved much since the start of the program .   PeNasras happy with her weight of 156, she wishes to maintain in the 150s-160s. She states her blood pressure has been doing really well since starting this program, even with the continual stress related to her home life. She has reported a slight increase in energy while doing her ADL's and cooking, which she is very proud of herself for! Her stress has not gotten any better and she does not see that changing anytime soon.  Jonessa saw the nutritionist recently and was given ideas of what food to eat to get the most nutritious value from it, since she does not eat that much food. She is still happy with her weight. She recently had her doctor appointment where he said she was doing very well with her progress. She admits to wearing her CPAP most nights, but knows she needs ot be wearing it every night. Her diabetes is still doing well and so is her blood pressure. There is still no change in her stress level, but she is practicing ways relax.  Leeah went to her doctor yesterday. Her A1C previously was  8.5 and now it has gone down to 6.8. She is very pleased with her results and motivated to continue exercising and eating healthier.   PeAzaleaas lost some weight since she has been in class. She says she has been eating alot of soup. Her ADLS have improved slightly.  She wants to be able to do things without getting too short of breath.   Expected Outcomes  Short: attend regularly to decrease stress. Long: Maintain a healthy stress free home.  Short: attend [facility] regularly to help improve ADL stamina, continue to maintain weight, decrease blood pressure, improve A1C, and help decrease stress. Long: continue to practice self care habits  Short: finish [facility] and attend all education topics during class, wear her CPAP at night Long: continue to practice self care and make plans as to what she is doing after this program to keep up all her hard work.   Short: continue program Long: for her A1C to continue to decrease post program  Short: Lose weight. Long: Maintain weight loss.      Core Components/Risk Factors/Patient Goals at Discharge (Final Review):  Goals and Risk Factor Review - 06/12/17 1254      Core Components/Risk Factors/Patient Goals Review   Personal Goals Review  Diabetes;Weight Management/Obesity;Improve shortness of breath with ADL's    Review  Jeanean has lost some weight since she has been in class. She says she has been eating alot of soup. Her ADLS have improved slightly. She wants to be able to do things without getting too short of breath.    Expected Outcomes  Short: Lose weight. Long: Maintain weight loss.       ITP Comments: ITP Comments    Row Name 02/27/17 1626 03/06/17 1240 03/08/17 1312 03/27/17 0834 04/24/17 0851   ITP Comments  Medical evaluation completed. Chart sent to Dr. Emily Filbert director of [place] for signature and review.  Spent 15 minutes talking with Joreen about PLB.  Liani Daryl Quiros did not complete her rehab session. Her pre exercise CBG was 72,  crackers given. CBG went down to 61, gel given. Her CBG went up to 82. Patient given education on importance of protein and checking her sugar regularly. She was instructed to eat lunch right when she gets home.   30 day review completed. ITP sent to Dr. Emily Filbert Director of [place]. Continue with ITP unless changes are made by physician.    30 day review completed. ITP sent to Dr. Emily Filbert Director of [place]. Continue with ITP unless changes are made by physician.     [place] Name 05/22/17 [phone] 06/14/17 1253 06/19/17 0833       ITP Comments  30 day review completed. ITP sent to Dr. Emily Filbert Director of [place]. Continue with ITP unless changes are made by physician.    Spent 15 minutes with Hitomi talking to her about her PAH and COPD. Gave her information about causes of COPD.  30 day review completed. ITP sent to Dr. Emily Filbert Director of [place]. Continue with ITP unless changes are made by physician.        Comments: 30 day review

## 2017-06-21 DIAGNOSIS — I272 Pulmonary hypertension, unspecified: Secondary | ICD-10-CM

## 2017-06-21 NOTE — Progress Notes (Signed)
Daily Session Note  Patient Details  Name: Hazelyn Kallen MRN: [id number] Date of Birth: Dec 19, 1945 Referring Provider:     Pulmonary Rehab from 02/27/2017 in [facility] Cardiac and Pulmonary Rehab  Referring Provider  Trellis Moment MD      Encounter Date: 06/21/2017  Check In: Session Check In - 06/21/17 1118      Check-In   Staff Present  Alberteen Sam, MA, RCEP, CCRP, Exercise Physiologist;Meredith Sherryll Burger, RN BSN;Joseph Flavia Shipper    Supervising physician immediately available to respond to emergencies  [facility] immediately available ER MD    Physician(s)  Dr. Alfred Levins and Burlene Arnt    Medication changes reported      No    Fall or balance concerns reported     No    Warm-up and Cool-down  Performed as group-led instruction    Resistance Training Performed  Yes    VAD Patient?  No      Pain Assessment   Currently in Pain?  No/denies        Exercise Prescription Changes - 06/21/17 0800      Response to Exercise   Blood Pressure (Admit)  126/60    Blood Pressure (Exit)  126/68    Heart Rate (Admit)  85 bpm    Heart Rate (Exercise)  100 bpm    Heart Rate (Exit)  77 bpm    Oxygen Saturation (Admit)  99 %    Oxygen Saturation (Exercise)  97 %    Oxygen Saturation (Exit)  99 %    Rating of Perceived Exertion (Exercise)  13    Perceived Dyspnea (Exercise)  2    Symptoms  none    Duration  Continue with 45 min of aerobic exercise without signs/symptoms of physical distress.    Intensity  THRR unchanged      Progression   Progression  Continue to progress workloads to maintain intensity without signs/symptoms of physical distress.    Average METs  2.28      Resistance Training   Training Prescription  Yes    Weight  4 lbs    Reps  10-15      Interval Training   Interval Training  No      Treadmill   MPH  1.5    Grade  0    Minutes  15    METs  2.15      NuStep   Level  6    SPM  67    Minutes  15    METs  2.8      REL-XR   Level  3    Minutes   15    METs  1.9      Home Exercise Plan   Plans to continue exercise at  [employer] (comment) [employer]    Frequency  Add 2 additional days to program exercise sessions.    Initial Home Exercises Provided  03/15/17       Social History   Tobacco Use  Smoking Status Never Smoker  Smokeless Tobacco Never Used    Goals Met:  Proper associated with RPD/PD & O2 Sat [place] with exercise equipment Using PLB without cueing & demonstrates good technique Exercise tolerated well Strength training completed today  Goals Unmet:  Not Applicable  Comments: Pt able to follow exercise prescription today without complaint.  Will continue to monitor for progression.    Dr. Emily Filbert is Medical Director for [place] and [facility]monary [facility].

## 2017-06-23 DIAGNOSIS — I272 Pulmonary hypertension, unspecified: Secondary | ICD-10-CM

## 2017-06-23 NOTE — Progress Notes (Signed)
Daily Session Note  Patient Details  Name: Jamie Murray MRN: [id number] Date of Birth: 1945/09/11 Referring Provider:     Pulmonary Rehab from 02/27/2017 in [facility] Cardiac and Pulmonary Rehab  Referring Provider  Trellis Moment MD      Encounter Date: 06/23/2017  Check In: Session Check In - 06/23/17 1121      Check-In   Location  [facility]-Cardiac & Pulmonary Rehab    Staff Present  Renita Papa, RN BSN;Jessica Luan Pulling, MA, RCEP, CCRP, Exercise Physiologist;Joseph Flavia Shipper    Supervising physician immediately available to respond to emergencies  [facility] immediately available ER MD    Physician(s)  Dr. Dia Crawford and [facility]    Medication changes reported      No    Fall or balance concerns reported     No    Warm-up and Cool-down  Performed as group-led instruction    Resistance Training Performed  Yes    VAD Patient?  No      Pain Assessment   Currently in Pain?  No/denies          Social History   Tobacco Use  Smoking Status Never Smoker  Smokeless Tobacco Never Used    Goals Met:  Proper associated with RPD/PD & O2 Sat [place] with exercise equipment Using PLB without cueing & demonstrates good technique Exercise tolerated well Strength training completed today  Goals Unmet:  Not Applicable  Comments: Pt able to follow exercise prescription today without complaint.  Will continue to monitor for progression.    Dr. Emily Filbert is Medical Director for [place] and [facility] Pulmonary Rehabilitation.

## 2017-06-26 ENCOUNTER — Telehealth: Payer: Self-pay

## 2017-06-26 DIAGNOSIS — I272 Pulmonary hypertension, unspecified: Secondary | ICD-10-CM

## 2017-06-26 NOTE — Telephone Encounter (Signed)
Counselor called Jamie Murray today to confirm her status of whether she is completing this program or not - with 9 sessions left to complete.   Counselor left a message on patient's answering machine requesting a return call.

## 2017-06-27 DIAGNOSIS — I272 Pulmonary hypertension, unspecified: Secondary | ICD-10-CM

## 2017-06-27 NOTE — Progress Notes (Signed)
Discharge Progress Report  Patient Details  Name: Jamie Murray MRN: [id number] Date of Birth: 12/03/1945 Referring Provider:     Pulmonary Rehab from 02/27/2017 in [facility]  Referring Provider  Trellis Moment MD       Number of Visits: 27/36  Reason for Discharge:  Early Exit:  Personal  Smoking History:  Social History   Tobacco Use  Smoking Status Never Smoker  Smokeless Tobacco Never Used    Diagnosis:  Pulmonary hypertension ([place])  ADL [facility]: Pulmonary Assessment Scores    Row Name 02/27/17 1515 05/01/17 1205       ADL [facility]   ADL Phase  Entry  Mid    SOB Score total  48  34    Rest  0  0    Walk  2  1    Stairs  1  3    Bath  3  2    Dress  0  2    Shop  3  1      CAT Score   CAT Score  14  -      mMRC Score   mMRC Score  4  -       Initial Exercise Prescription: Initial Exercise Prescription - 02/27/17 1600      Date of Initial Exercise RX and Referring Provider   Date  02/27/17    Referring Provider  Trellis Moment MD      Treadmill   MPH  1    Grade  0.5    Minutes  15    METs  1.83      NuStep   Level  1    SPM  80    Minutes  15    METs  2      REL-XR   Level  1    Speed  50    Minutes  15    METs  2      Prescription Details   Frequency (times per week)  3    Duration  Progress to 45 minutes of aerobic exercise without signs/symptoms of physical distress      Intensity   THRR 40-80% of Max Heartrate  103-134    Ratings of Perceived Exertion  11-13    Perceived Dyspnea  0-4      Progression   Progression  Continue to progress workloads to maintain intensity without signs/symptoms of physical distress.      Resistance Training   Training Prescription  Yes    Weight  3 lbs       Discharge Exercise Prescription (Final Exercise Prescription Changes): Exercise Prescription Changes - 06/21/17 0800      Response to Exercise   Blood Pressure (Admit)  126/60    Blood Pressure (Exit)  126/68     Heart Rate (Admit)  85 bpm    Heart Rate (Exercise)  100 bpm    Heart Rate (Exit)  77 bpm    Oxygen Saturation (Admit)  99 %    Oxygen Saturation (Exercise)  97 %    Oxygen Saturation (Exit)  99 %    Rating of Perceived Exertion (Exercise)  13    Perceived Dyspnea (Exercise)  2    Symptoms  none    Duration  Continue with 45 min of aerobic exercise without signs/symptoms of physical distress.    Intensity  THRR unchanged      Progression   Progression  Continue to progress workloads to  maintain intensity without signs/symptoms of physical distress.    Average METs  2.28      Resistance Training   Training Prescription  Yes    Weight  4 lbs    Reps  10-15      Interval Training   Interval Training  No      Treadmill   MPH  1.5    Grade  0    Minutes  15    METs  2.15      NuStep   Level  6    SPM  67    Minutes  15    METs  2.8      REL-XR   Level  3    Minutes  15    METs  1.9      Home Exercise Plan   Plans to continue exercise at  [employer] (comment) [employer]    Frequency  Add 2 additional days to program exercise sessions.    Initial Home Exercises Provided  03/15/17       Functional Capacity: 6 Minute Walk    Row Name 02/27/17 1637         6 Minute Walk   Phase  Initial     Distance  610 feet     Walk Time  6 minutes     # of Rest Breaks  0     MPH  1.15     METS  2.08     RPE  9     Perceived Dyspnea   0     VO2 Peak  7.28     Symptoms  No walking with cane and wearing slides     Resting HR  72 bpm     Resting BP  128/66     Resting Oxygen Saturation   99 %     Exercise Oxygen Saturation  during 6 min walk  90 %     Max Ex. HR  98 bpm     Max Ex. BP  152/80     2 Minute Post BP  134/74       Interval HR   1 Minute HR  85     2 Minute HR  86     3 Minute HR  87     4 Minute HR  96     5 Minute HR  92     6 Minute HR  98     2 Minute Post HR  76     Interval Heart Rate?  Yes       Interval Oxygen   Interval  Oxygen?  Yes     Baseline Oxygen Saturation %  99 %     1 Minute Oxygen Saturation %  96 %     1 Minute Liters of Oxygen  0 L room air     2 Minute Oxygen Saturation %  93 %     2 Minute Liters of Oxygen  0 L     3 Minute Oxygen Saturation %  90 %     3 Minute Liters of Oxygen  0 L     4 Minute Oxygen Saturation %  97 %     4 Minute Liters of Oxygen  0 L     5 Minute Oxygen Saturation %  99 %     5 Minute Liters of Oxygen  0 L     6 Minute Oxygen Saturation %  91 %  6 Minute Liters of Oxygen  0 L     2 Minute Post Oxygen Saturation %  95 %     2 Minute Post Liters of Oxygen  0 L        Psychological, QOL, Others - Outcomes: PHQ 2/9: Depression screen [facility] 2/9 05/01/2017 02/27/2017 11/29/2016 11/29/2016 07/22/2015  Decreased Interest _0 0  Down, Depressed, Hopeless _1 0  PHQ - 2 Score _2 0  Altered sleeping 0 0 3 3 -  Tired, decreased energy _3 -  Change in appetite _4 -  Feeling bad or failure about yourself  _5 -  Trouble concentrating _6 -  Moving slowly or fidgety/restless 0 0 0 0 -  Suicidal thoughts 0 _7 -  PHQ-9 Score _8 -  Difficult doing work/chores Not difficult at all Somewhat difficult - - -    Quality of Life:   Personal Goals: Goals established at orientation with interventions provided to work toward goal. Personal Goals and Risk Factors at Admission - 02/27/17 1526      Core Components/Risk Factors/Patient Goals on Admission    Weight Management  Weight Maintenance;Yes    Intervention  Weight Management: Develop a combined nutrition and exercise program designed to reach desired caloric intake, while maintaining appropriate intake of nutrient and fiber, sodium and fats, and appropriate energy expenditure required for the weight goal.;Weight Management: Provide education and appropriate resources to help participant work on and attain dietary goals.;Weight Management/Obesity: Establish reasonable short term and  long term weight goals.    Admit Weight  151 lb 8 oz (68.7 kg)    Goal Weight: Short Term  146 lb (66.2 kg)    Goal Weight: Long Term  150 lb (68 kg)    Expected Outcomes  Short Term: Continue to assess and modify interventions until short term weight is achieved;Long Term: Adherence to nutrition and physical activity/exercise program aimed toward attainment of established weight goal;Weight Maintenance: Understanding of the daily nutrition guidelines, which includes 25-35% calories from fat, 7% or less cal from saturated fats, less than 218m cholesterol, less than 1.5gm of sodium, & 5 or more servings of fruits and vegetables daily;Understanding recommendations for meals to include 15-35% energy as protein, 25-35% energy from fat, 35-60% energy from carbohydrates, less than 2074mof dietary cholesterol, 20-35 gm of total fiber daily;Understanding of distribution of calorie intake throughout the day with the consumption of 4-5 meals/snacks    Diabetes  Yes    Intervention  Provide education about signs/symptoms and action to take for hypo/hyperglycemia.;Provide education about proper nutrition, including hydration, and aerobic/resistive exercise prescription along with prescribed medications to achieve blood glucose in normal ranges: Fasting glucose 65-99 mg/dL    Expected Outcomes  Short Term: Participant verbalizes understanding of the signs/symptoms and immediate care of hyper/hypoglycemia, proper foot care and importance of medication, aerobic/resistive exercise and nutrition plan for blood glucose control.;Long Term: Attainment of HbA1C < 7%.    Hypertension  Yes    Intervention  Provide education on lifestyle modifcations including regular physical activity/exercise, weight management, moderate sodium restriction and increased consumption of fresh fruit, vegetables, and low fat dairy, alcohol moderation, and smoking cessation.;Monitor prescription use compliance.    Expected Outcomes  Short Term:  Continued assessment and intervention until BP is < 140/9066mG in hypertensive participants. < 130/69m52m in hypertensive participants with diabetes,  heart failure or chronic kidney disease.;Long Term: Maintenance of blood pressure at goal levels.    Stress  Yes    Intervention  Offer individual and/or small group education and counseling on adjustment to heart disease, stress management and health-related lifestyle change. Teach and support self-help strategies.;Refer participants experiencing significant psychosocial distress to appropriate mental health specialists for further evaluation and treatment. When possible, include family members and significant others in education/counseling sessions.    Expected Outcomes  Short Term: Participant demonstrates changes in health-related behavior, relaxation and other stress management skills, ability to obtain effective social support, and compliance with psychotropic medications if prescribed.;Long Term: Emotional wellbeing is indicated by absence of clinically significant psychosocial distress or social isolation.        Personal Goals Discharge: Goals and Risk Factor Review    Row Name 03/24/17 1358 04/12/17 1226 05/03/17 1222 05/24/17 1240 06/12/17 1254     Core Components/Risk Factors/Patient Goals Review   Personal Goals Review  Weight Management/Obesity;Improve shortness of breath with ADL's;Diabetes;Stress;Hypertension  Weight Management/Obesity;Stress;Diabetes;Hypertension  Stress;Diabetes;Hypertension;Weight Management/Obesity  Diabetes  Diabetes;Weight Management/Obesity;Improve shortness of breath with ADL's   Review  Rafeef has had alot of stress dealing with her home situation. She states her husband and her do not get along and she would rather not be living with him. She states her diabetes has been under control and she checks her levels at home.  Her ADL's have not  improved much since the start of the program .   Pluma is happy with her  weight of 156, she wishes to maintain in the 150s-160s. She states her blood pressure has been doing really well since starting this program, even with the continual stress related to her home life. She has reported a slight increase in energy while doing her ADL's and cooking, which she is very proud of herself for! Her stress has not gotten any better and she does not see that changing anytime soon.  Chevella saw the nutritionist recently and was given ideas of what food to eat to get the most nutritious value from it, since she does not eat that much food. She is still happy with her weight. She recently had her doctor appointment where he said she was doing very well with her progress. She admits to wearing her CPAP most nights, but knows she needs ot be wearing it every night. Her diabetes is still doing well and so is her blood pressure. There is still no change in her stress level, but she is practicing ways relax.  Meriam went to her doctor yesterday. Her A1C previously was 8.5 and now it has gone down to 6.8. She is very pleased with her results and motivated to continue exercising and eating healthier.   Jevaeh has lost some weight since she has been in class. She says she has been eating alot of soup. Her ADLS have improved slightly. She wants to be able to do things without getting too short of breath.   Expected Outcomes  Short: attend regularly to decrease stress. Long: Maintain a healthy stress free home.  Short: attend [facility] regularly to help improve ADL stamina, continue to maintain weight, decrease blood pressure, improve A1C, and help decrease stress. Long: continue to practice self care habits  Short: finish [facility] and attend all education topics during class, wear her CPAP at night Long: continue to practice self care and make plans as to what she is doing after this program to keep up all her hard work.  Short: continue program Long: for her A1C to continue to decrease post program  Short:  Lose weight. Long: Maintain weight loss.      Exercise Goals and Review: Exercise Goals    Row Name 02/27/17 1643             Exercise Goals   Increase Physical Activity  Yes       Intervention  Provide advice, education, support and counseling about physical activity/exercise needs.;Develop an individualized exercise prescription for aerobic and resistive training based on initial evaluation findings, risk stratification, comorbidities and participant's personal goals.       Expected Outcomes  Achievement of increased cardiorespiratory fitness and enhanced flexibility, muscular endurance and strength shown through measurements of functional capacity and personal statement of participant.       Increase Strength and Stamina  Yes       Intervention  Provide advice, education, support and counseling about physical activity/exercise needs.;Develop an individualized exercise prescription for aerobic and resistive training based on initial evaluation findings, risk stratification, comorbidities and participant's personal goals.       Expected Outcomes  Achievement of increased cardiorespiratory fitness and enhanced flexibility, muscular endurance and strength shown through measurements of functional capacity and personal statement of participant.       Able to understand and use rate of perceived exertion (RPE) scale  Yes       Intervention  Provide education and explanation on how to use RPE scale       Expected Outcomes  Short Term: Able to use RPE daily in rehab to express subjective intensity level;Long Term:  Able to use RPE to guide intensity level when exercising independently       Able to understand and use Dyspnea scale  Yes       Intervention  Provide education and explanation on how to use Dyspnea scale       Expected Outcomes  Short Term: Able to use Dyspnea scale daily in rehab to express subjective sense of shortness of breath during exertion;Long Term: Able to use Dyspnea scale to  guide intensity level when exercising independently       Knowledge and understanding of Target Heart Rate Range (THRR)  Yes       Intervention  Provide education and explanation of THRR including how the numbers were predicted and where they are located for reference       Expected Outcomes  Short Term: Able to state/look up THRR;Long Term: Able to use THRR to govern intensity when exercising independently;Short Term: Able to use daily as guideline for intensity in rehab       Able to check pulse independently  Yes       Intervention  Provide education and demonstration on how to check pulse in carotid and radial arteries.;Review the importance of being able to check your own pulse for safety during independent exercise       Expected Outcomes  Short Term: Able to explain why pulse checking is important during independent exercise;Long Term: Able to check pulse independently and accurately       Understanding of Exercise Prescription  Yes       Intervention  Provide education, explanation, and written materials on patient's individual exercise prescription       Expected Outcomes  Short Term: Able to explain program exercise prescription;Long Term: Able to explain home exercise prescription to exercise independently          Nutrition & Weight - Outcomes: Pre Biometrics -  02/27/17 1643      Pre Biometrics   Height  5' 7.5" (1.715 m)    Weight  151 lb 8 oz (68.7 kg)    Waist Circumference  33 inches    Hip Circumference  36.75 inches    Waist to Hip Ratio  0.9 %    BMI (Calculated)  23.36        Nutrition: Nutrition Therapy & Goals - 04/10/17 1006      Nutrition Therapy   Diet  Instructed on a meal plan including dietary guidelines for lung health and diabetes    Drug/Food Interactions  Statins/Certain Fruits    Protein (specify units)  6    Fiber  20 grams    Whole Grain Foods  3 servings    Saturated Fats  12 max. grams    Fruits and Vegetables  5 servings/day    Sodium  1500  grams      Personal Nutrition Goals   Nutrition Goal  Measure out some starchy foods. Count 1 cup serving as 2 servings of carbohydrate.    Personal Goal #2  Balance meals with protein, 2-3 servings of carbohydrate and non-starchy vegetables.    Personal Goal #3  Read labels for sodium.    Personal Goal #4  Limit sugar sweetened beverages.      Intervention Plan   Intervention  Prescribe, educate and counsel regarding individualized specific dietary modifications aiming towards targeted core components such as weight, hypertension, lipid management, diabetes, heart failure and other comorbidities.;Nutrition handout(s) given to patient.    Expected Outcomes  Short Term Goal: Understand basic principles of dietary content, such as calories, fat, sodium, cholesterol and nutrients.;Short Term Goal: A plan has been developed with personal nutrition goals set during dietitian appointment.;Long Term Goal: Adherence to prescribed nutrition plan.       Nutrition Discharge: Nutrition Assessments - 02/27/17 1614      MEDFICTS Scores   Pre Score  65       Education Questionnaire Score: Knowledge Questionnaire Score - 02/27/17 1519      Knowledge Questionnaire Score   Pre Score  8/10       Goals reviewed with patient; copy given to patient.

## 2017-06-27 NOTE — Progress Notes (Signed)
Pulmonary Individual Treatment Plan  Patient Details  Name: Jamie Murray MRN: [id number] Date of Birth: 04/09/1946 Referring Provider:     Pulmonary Rehab from 02/27/2017 in [facility] Cardiac and Pulmonary Rehab  Referring Provider  Trellis Moment MD      Initial Encounter Date:    Pulmonary Rehab from 02/27/2017 in [facility] Cardiac and Pulmonary Rehab  Date  02/27/17  Referring Provider  Trellis Moment MD      Visit Diagnosis: Pulmonary hypertension ([place])  Patient's Home Medications on Admission:  Current Outpatient Medications:  .  aspirin 81 MG tablet, Take 81 mg by mouth daily., Disp: , Rfl:  .  atorvastatin (LIPITOR) 10 MG tablet, Take 1 tablet (10 mg total) by mouth daily., Disp: 90 tablet, Rfl: 3 .  Blood Glucose Monitoring Suppl (GNP EASY TOUCH GLUCOSE METER) DEVI, See admin instructions., Disp: , Rfl: 0 .  buPROPion (WELLBUTRIN SR) 150 MG 12 hr tablet, Take 1 tablet (150 mg total) by mouth 2 (two) times daily. Take 1 tablet in the morning for 2 weeks then take twice and day., Disp: 180 tablet, Rfl: 1 .  Cholecalciferol (VITAMIN D3) 2000 units TABS, Take 2,000 Units by mouth daily., Disp: , Rfl:  .  clonazePAM (KLONOPIN) 1 MG tablet, Take 1 mg by mouth at bedtime., Disp: , Rfl: 0 .  clopidogrel (PLAVIX) 75 MG tablet, Take 1 tablet (75 mg total) by mouth daily., Disp: 90 tablet, Rfl: 3 .  Dulaglutide (TRULICITY) 1.5 GB/1.5VV SOPN, Inject 1.5 mg into the skin once a week., Disp: 12 pen, Rfl: 3 .  EASY TOUCH TEST test strip, USE TO CHECK BLOOD SUGAR ONCE DAILY, Disp: , Rfl: 0 .  furosemide (LASIX) 20 MG tablet, Take 20 mg by mouth daily as needed., Disp: , Rfl: 2 .  glimepiride (AMARYL) 2 MG tablet, Take 2 mg by mouth daily., Disp: , Rfl:  .  hydrALAZINE (APRESOLINE) 50 MG tablet, Take 100 mg by mouth 2 (two) times daily., Disp: , Rfl: 1 .  ipratropium-albuterol (DUONEB) 0.5-2.5 (3) MG/3ML SOLN, Take 3 mLs by nebulization every 4 (four) hours as needed., Disp: 360 mL, Rfl: 12 .   LORazepam (ATIVAN) 0.5 MG tablet, Take 1 tablet (0.5 mg total) by mouth daily as needed., Disp: 30 tablet, Rfl: 1 .  losartan (COZAAR) 100 MG tablet, Take 100 mg by mouth daily., Disp: , Rfl: 6 .  metFORMIN (GLUCOPHAGE-XR) 500 MG 24 hr tablet, Take 4 tablets (2,000 mg total) by mouth daily with breakfast., Disp: 360 tablet, Rfl: 0 .  metoprolol succinate (TOPROL-XL) 100 MG 24 hr tablet, Take 100 mg by mouth daily. , Disp: , Rfl:  .  Multiple Vitamin (MULTIVITAMIN) tablet, Take 1 tablet by mouth daily., Disp: , Rfl:  .  pantoprazole (PROTONIX) 40 MG tablet, Take 1 tablet (40 mg total) by mouth 2 (two) times daily., Disp: 60 tablet, Rfl: 11 .  potassium chloride (KLOR-CON 10) 10 MEQ tablet, Take 1 tablet (10 mEq total) by mouth 2 (two) times daily., Disp: 60 tablet, Rfl: 0 .  PROAIR HFA 108 (90 Base) MCG/ACT inhaler, Inhale 2 puffs into the lungs every 4 (four) hours as needed for wheezing or shortness of breath., Disp: 8.5 g, Rfl: 2 .  spironolactone (ALDACTONE) 25 MG tablet, Take 25 mg by mouth daily., Disp: , Rfl: 6  Past Medical History: Past Medical History:  Diagnosis Date  . Allergy   . Anemia   . Anxiety   . Depression   . Diabetes ([place])   .  GERD (gastroesophageal reflux disease)   . Hyperlipidemia   . Hypertension   . Insomnia   . Migraines   . Osteoporosis   . [facility] spotted fever   . Transient cerebral ischemia   . Vertigo    every 2-3 months    Tobacco Use: Social History   Tobacco Use  Smoking Status Never Smoker  Smokeless Tobacco Never Used    Labs: Recent Review Flowsheet Data    Labs for ITP Cardiac and Pulmonary Rehab Latest Ref Rng & Units 04/18/2012 07/22/2015 03/07/2016 06/07/2016 11/29/2016   Cholestrol 100 - 199 mg/dL 185 139 145 213(H) 95(L)   LDLCALC 0 - 99 mg/dL 118(H) 63 77 143(H) 45   HDL >39 mg/dL 42 45 43 37(L) 29(L)   Trlycerides 0 - 149 mg/dL 126 156(H) 123 164(H) 105   Hemoglobin A1c - 6.5(H) - 7.8% - -       Pulmonary Assessment  Scores: Pulmonary Assessment Scores    Row Name 02/27/17 1515 05/01/17 1205       ADL UCSD   ADL Phase  Entry  Mid    SOB Score total  48  34    Rest  0  0    Walk  2  1    Stairs  1  3    Bath  3  2    Dress  0  2    Shop  3  1      CAT Score   CAT Score  14  -      mMRC Score   mMRC Score  4  -       Pulmonary Function Assessment: Pulmonary Function Assessment - 02/27/17 1525      Breath   Bilateral Breath Sounds  Clear    Shortness of Breath  No she feels tired not short of breath        Exercise Target Goals:    Exercise Program Goal: Individual exercise prescription set using results from initial 6 min walk test and THRR while considering  patient's activity barriers and safety.    Exercise Prescription Goal: Initial exercise prescription builds to 30-45 minutes a day of aerobic activity, 2-3 days per week.  Home exercise guidelines will be given to patient during program as part of exercise prescription that the participant will acknowledge.  Activity Barriers & Risk Stratification: Activity Barriers & Cardiac Risk Stratification - 02/27/17 1640      Activity Barriers & Cardiac Risk Stratification   Activity Barriers  Deconditioning;Muscular Weakness;Shortness of Breath;Assistive Device       6 Minute Walk: 6 Minute Walk    Row Name 02/27/17 1637         6 Minute Walk   Phase  Initial     Distance  610 feet     Walk Time  6 minutes     # of Rest Breaks  0     MPH  1.15     METS  2.08     RPE  9     Perceived Dyspnea   0     VO2 Peak  7.28     Symptoms  No walking with cane and wearing slides     Resting HR  72 bpm     Resting BP  128/66     Resting Oxygen Saturation   99 %     Exercise Oxygen Saturation  during 6 min walk  90 %     Max Ex.  HR  98 bpm     Max Ex. BP  152/80     2 Minute Post BP  134/74       Interval HR   1 Minute HR  85     2 Minute HR  86     3 Minute HR  87     4 Minute HR  96     5 Minute HR  92     6 Minute HR   98     2 Minute Post HR  76     Interval Heart Rate?  Yes       Interval Oxygen   Interval Oxygen?  Yes     Baseline Oxygen Saturation %  99 %     1 Minute Oxygen Saturation %  96 %     1 Minute Liters of Oxygen  0 L room air     2 Minute Oxygen Saturation %  93 %     2 Minute Liters of Oxygen  0 L     3 Minute Oxygen Saturation %  90 %     3 Minute Liters of Oxygen  0 L     4 Minute Oxygen Saturation %  97 %     4 Minute Liters of Oxygen  0 L     5 Minute Oxygen Saturation %  99 %     5 Minute Liters of Oxygen  0 L     6 Minute Oxygen Saturation %  91 %     6 Minute Liters of Oxygen  0 L     2 Minute Post Oxygen Saturation %  95 %     2 Minute Post Liters of Oxygen  0 L       Oxygen Initial Assessment: Oxygen Initial Assessment - 02/27/17 1520      Home Oxygen   Home Oxygen Device  None    Sleep Oxygen Prescription  CPAP    Liters per minute  0    Home Exercise Oxygen Prescription  None    Home at Rest Exercise Oxygen Prescription  None    Compliance with Home Oxygen Use  Yes      Initial 6 min Walk   Oxygen Used  None      Program Oxygen Prescription   Program Oxygen Prescription  None      Intervention   Short Term Goals  To learn and understand importance of monitoring SPO2 with pulse oximeter and demonstrate accurate use of the pulse oximeter.;To learn and demonstrate proper pursed lip breathing techniques or other breathing techniques.;To learn and understand importance of maintaining oxygen saturations>88%;To learn and demonstrate proper use of respiratory medications    Long  Term Goals  Maintenance of O2 saturations>88%;Compliance with respiratory medication;Demonstrates proper use of MDI's;Exhibits proper breathing techniques, such as pursed lip breathing or other method taught during program session;Verbalizes importance of monitoring SPO2 with pulse oximeter and return demonstration       Oxygen Re-Evaluation: Oxygen Re-Evaluation    Row Name 03/06/17  1240 03/24/17 1402 04/10/17 1606 05/03/17 1231 06/12/17 1256     Program Oxygen Prescription   Program Oxygen Prescription  None  None  None  None  None     Home Oxygen   Home Oxygen Device  None  None  None  None  None   Sleep Oxygen Prescription  CPAP  CPAP  CPAP  CPAP  CPAP   Liters per minute  0  0  0  0  0   Home Exercise Oxygen Prescription  None  None  None  None  None   Home at Rest Exercise Oxygen Prescription  None  None  None  None  None   Compliance with Home Oxygen Use  Yes CPAP  Yes CPAP  Yes CPAP  Yes  Yes     Goals/Expected Outcomes   Short Term Goals  To learn and understand importance of maintaining oxygen saturations>88%;To learn and demonstrate proper use of respiratory medications;To learn and demonstrate proper pursed lip breathing techniques or other breathing techniques.;To learn and understand importance of monitoring SPO2 with pulse oximeter and demonstrate accurate use of the pulse oximeter.  To learn and understand importance of maintaining oxygen saturations>88%;To learn and exhibit compliance with exercise, home and travel O2 prescription;To learn and understand importance of monitoring SPO2 with pulse oximeter and demonstrate accurate use of the pulse oximeter.;To learn and demonstrate proper pursed lip breathing techniques or other breathing techniques.;To learn and demonstrate proper use of respiratory medications  To learn and understand importance of maintaining oxygen saturations>88%;To learn and demonstrate proper use of respiratory medications;To learn and exhibit compliance with exercise, home and travel O2 prescription;To learn and demonstrate proper pursed lip breathing techniques or other breathing techniques.;To learn and understand importance of monitoring SPO2 with pulse oximeter and demonstrate accurate use of the pulse oximeter.  To learn and exhibit compliance with exercise, home and travel O2 prescription;To learn and understand importance of  monitoring SPO2 with pulse oximeter and demonstrate accurate use of the pulse oximeter.;To learn and understand importance of maintaining oxygen saturations>88%;To learn and demonstrate proper pursed lip breathing techniques or other breathing techniques.;To learn and demonstrate proper use of respiratory medications  To learn and exhibit compliance with exercise, home and travel O2 prescription;To learn and understand importance of monitoring SPO2 with pulse oximeter and demonstrate accurate use of the pulse oximeter.;To learn and understand importance of maintaining oxygen saturations>88%;To learn and demonstrate proper pursed lip breathing techniques or other breathing techniques.;To learn and demonstrate proper use of respiratory medications   Long  Term Goals  Verbalizes importance of monitoring SPO2 with pulse oximeter and return demonstration;Exhibits proper breathing techniques, such as pursed lip breathing or other method taught during program session;Compliance with respiratory medication;Maintenance of O2 saturations>88%  Maintenance of O2 saturations>88%;Compliance with respiratory medication;Exhibits compliance with exercise, home and travel O2 prescription;Verbalizes importance of monitoring SPO2 with pulse oximeter and return demonstration;Exhibits proper breathing techniques, such as pursed lip breathing or other method taught during program session  Exhibits compliance with exercise, home and travel O2 prescription;Maintenance of O2 saturations>88%;Verbalizes importance of monitoring SPO2 with pulse oximeter and return demonstration;Compliance with respiratory medication;Exhibits proper breathing techniques, such as pursed lip breathing or other method taught during program session  Exhibits compliance with exercise, home and travel O2 prescription;Verbalizes importance of monitoring SPO2 with pulse oximeter and return demonstration;Compliance with respiratory medication;Exhibits proper  breathing techniques, such as pursed lip breathing or other method taught during program session  Exhibits compliance with exercise, home and travel O2 prescription;Verbalizes importance of monitoring SPO2 with pulse oximeter and return demonstration;Compliance with respiratory medication;Exhibits proper breathing techniques, such as pursed lip breathing or other method taught during program session   Comments  Connelly is using her CPAP everynight as instructed. Explained to her that PLB can be used to help catch her breath, also that she can purchase a pulse oximeter to moniter her oxygen at home. Explained to her that her oxygen  should be 88 percent and above.   Pegggs does not have a pulse oximeter at home to check her own oxygen. She states she has been taking her nebulizer and her inhalers as prescribed. She needs to work on PLB techniques while she is in class.   Donnalynn states that she takes her nebulizer once a day. She takes her albuterol inhaler PRN and has not used it lately. She does not wear home oxygen but is on a CPAP at night. She states she wears her CPAP every night. Her oxygen has not fallen below 88 percent on any day in [facility].   Lerline is using her respiratory medications properly. She is using her CPAP at night, except when she has sinus issues. She checks her oxygen regularly and is pleased with the improvement.   Shatera is using her CPAP at night and taking her medications as prescribed. She has been checking her oxygen at home and has been in the mid 90's when in class. She has not been using PLB as much as she should. Informed her to use PLB when she gets short of breath in class or at home.   Goals/Expected Outcomes  Short: Work on PLB. Obtain a pulse oximeter. Long Be proficient in checking oxygen saturation and PLB  Short: Be more proficient with PLB. Long: Be independent with PLB.  Short; obtain a pulse oximeter. Long: check oxygen saturation at home independently.  Short: wear CPAP  every night; Long: continue to check oxygen and wear CPAP  Short: practice PLB Long: Use PLB independently.      Oxygen Discharge (Final Oxygen Re-Evaluation): Oxygen Re-Evaluation - 06/12/17 1256      Program Oxygen Prescription   Program Oxygen Prescription  None      Home Oxygen   Home Oxygen Device  None    Sleep Oxygen Prescription  CPAP    Liters per minute  0    Home Exercise Oxygen Prescription  None    Home at Rest Exercise Oxygen Prescription  None    Compliance with Home Oxygen Use  Yes      Goals/Expected Outcomes   Short Term Goals  To learn and exhibit compliance with exercise, home and travel O2 prescription;To learn and understand importance of monitoring SPO2 with pulse oximeter and demonstrate accurate use of the pulse oximeter.;To learn and understand importance of maintaining oxygen saturations>88%;To learn and demonstrate proper pursed lip breathing techniques or other breathing techniques.;To learn and demonstrate proper use of respiratory medications    Long  Term Goals  Exhibits compliance with exercise, home and travel O2 prescription;Verbalizes importance of monitoring SPO2 with pulse oximeter and return demonstration;Compliance with respiratory medication;Exhibits proper breathing techniques, such as pursed lip breathing or other method taught during program session    Comments  Markea is using her CPAP at night and taking her medications as prescribed. She has been checking her oxygen at home and has been in the mid 90's when in class. She has not been using PLB as much as she should. Informed her to use PLB when she gets short of breath in class or at home.    Goals/Expected Outcomes  Short: practice PLB Long: Use PLB independently.       Initial Exercise Prescription: Initial Exercise Prescription - 02/27/17 1600      Date of Initial Exercise RX and Referring Provider   Date  02/27/17    Referring Provider  Trellis Moment MD      Treadmill  MPH  1     Grade  0.5    Minutes  15    METs  1.83      NuStep   Level  1    SPM  80    Minutes  15    METs  2      REL-XR   Level  1    Speed  50    Minutes  15    METs  2      Prescription Details   Frequency (times per week)  3    Duration  Progress to 45 minutes of aerobic exercise without signs/symptoms of physical distress      Intensity   THRR 40-80% of Max Heartrate  103-134    Ratings of Perceived Exertion  11-13    Perceived Dyspnea  0-4      Progression   Progression  Continue to progress workloads to maintain intensity without signs/symptoms of physical distress.      Resistance Training   Training Prescription  Yes    Weight  3 lbs       Perform Capillary Blood Glucose checks as needed.  Exercise Prescription Changes: Exercise Prescription Changes    Row Name 02/27/17 1600 03/14/17 1300 03/15/17 1300 03/29/17 1400 04/11/17 1600     Response to Exercise   Blood Pressure (Admit)  128/66  132/78  -  118/62  114/64   Blood Pressure (Exercise)  152/80  138/76  -  -  -   Blood Pressure (Exit)  134/74  128/64  -  128/70  128/80   Heart Rate (Admit)  72 bpm  78 bpm  -  72 bpm  70 bpm   Heart Rate (Exercise)  98 bpm  86 bpm  -  96 bpm  95 bpm   Heart Rate (Exit)  76 bpm  70 bpm  -  76 bpm  85 bpm   Oxygen Saturation (Admit)  99 %  95 %  -  96 %  95 %   Oxygen Saturation (Exercise)  90 %  96 %  -  94 %  97 %   Oxygen Saturation (Exit)  95 %  94 %  -  93 %  97 %   Rating of Perceived Exertion (Exercise)  9  11  -  13  13   Perceived Dyspnea (Exercise)  0  0  -  0  0   Symptoms  none  none  -  none  none   Comments  walk test results, wearing slides, using cane  second day full day of exercise  -  -  -   Duration  -  Progress to 45 minutes of aerobic exercise without signs/symptoms of physical distress  -  Continue with 45 min of aerobic exercise without signs/symptoms of physical distress.  Continue with 45 min of aerobic exercise without signs/symptoms of physical  distress.   Intensity  -  THRR unchanged  -  THRR unchanged  THRR unchanged     Progression   Progression  -  Continue to progress workloads to maintain intensity without signs/symptoms of physical distress.  -  Continue to progress workloads to maintain intensity without signs/symptoms of physical distress.  Continue to progress workloads to maintain intensity without signs/symptoms of physical distress.   Average METs  -  1.87  -  2.57  2.55     Resistance Training   Training Prescription  -  Yes  -  Yes  Yes   Weight  -  3 lbs  -  3 lbs  3 lbs   Reps  -  10-15  -  10-15  10-15     Interval Training   Interval Training  -  No  -  No  No     Treadmill   MPH  -  0  -  1.5  1.5   Grade  -  0.5  -  1.5  1.5   Minutes  -  15  -  15  15   METs  -  1.83  -  2.46  2.46     NuStep   Level  -  1  -  2  5   SPM  -  60  -  70  81   Minutes  -  15  -  15  15   METs  -  1.7  -  2.9  2.2     REL-XR   Level  -  1  -  1  1   Speed  -  40  -  44  42   Minutes  -  15  -  15  15   METs  -  2.1  -  2.4  3     Home Exercise Plan   Plans to continue exercise at  -  -  [employer] (comment) [employer] (comment) [employer] (comment) [employer]   Frequency  -  -  Add 1 additional day to program exercise sessions.  Add 1 additional day to program exercise sessions.  Add 1 additional day to program exercise sessions.   Initial Home Exercises Provided  -  -  03/15/17  03/15/17  03/15/17   Row Name 04/25/17 1400 05/09/17 1600 05/23/17 1600 06/07/17 1400 06/21/17 0800     Response to Exercise   Blood Pressure (Admit)  124/64  130/70  132/62  128/64  126/60   Blood Pressure (Exit)  126/74  126/68  126/70  124/60  126/68   Heart Rate (Admit)  72 bpm  70 bpm  76 bpm  79 bpm  85 bpm   Heart Rate (Exercise)  106 bpm  102 bpm  120 bpm  98 bpm  100 bpm   Heart Rate (Exit)  70 bpm  70 bpm  88 bpm  77 bpm  77 bpm   Oxygen Saturation (Admit)  98 %  90  %  100 %  100 %  99 %   Oxygen Saturation (Exercise)  94 %  100 %  95 %  95 %  97 %   Oxygen Saturation (Exit)  97 %  99 %  100 %  99 %  99 %   Rating of Perceived Exertion (Exercise)  _0 Perceived Dyspnea (Exercise)  3  0  0  1  2   Symptoms  SOB  none  none  none  none   Duration  Continue with 45 min of aerobic exercise without signs/symptoms of physical distress.  Continue with 45 min of aerobic exercise without signs/symptoms of physical distress.  Continue with 45 min of aerobic exercise without signs/symptoms of physical distress.  Continue with 45 min of aerobic exercise without signs/symptoms of physical distress.  Continue with 45 min of aerobic exercise without signs/symptoms of physical distress.   Intensity  THRR unchanged  THRR unchanged  THRR unchanged  THRR unchanged  THRR unchanged     Progression   Progression  Continue to progress workloads to maintain intensity without signs/symptoms of physical distress.  Continue to progress workloads to maintain intensity without signs/symptoms of physical distress.  Continue to progress workloads to maintain intensity without signs/symptoms of physical distress.  Continue to progress workloads to maintain intensity without signs/symptoms of physical distress.  Continue to progress workloads to maintain intensity without signs/symptoms of physical distress.   Average METs  2.85  2.79  2.89  2.38  2.28     Resistance Training   Training Prescription  Yes  Yes  Yes  Yes  Yes   Weight  3 lbs  3 lbs  4 lbs  4 lbs  4 lbs   Reps  10-15  10-15  10-15  10-15  10-15     Interval Training   Interval Training  No  No  No  No  No     Treadmill   MPH  1.5  1.5  1.5  1.5  1.5   Grade  1.5  1.5  1.5  0  0   Minutes  _0 METs  2.46  2.46  2.46  2.15  2.15     NuStep   Level  _1 SPM  72  72  64  60  67   Minutes  _2 METs  3  3  2.7  3.1  2.8     REL-XR   Level  _3 Speed  48  49  51  38  -   Minutes  _4 METs  3.1  3  3.5  1.9  1.9     Home Exercise Plan   Plans to continue exercise at  [employer] (comment) [employer] (comment) [employer] (comment) [employer] (comment) [employer] (comment) [employer]   Frequency  Add 1 additional day to program exercise sessions.  Add 2 additional days to program exercise sessions.  Add 2 additional days to program exercise sessions.  Add 2 additional days to program exercise sessions.  Add 2 additional days to program exercise sessions.   Initial Home Exercises Provided  03/15/17  03/15/17  03/15/17  03/15/17  03/15/17      Exercise Comments: Exercise Comments    Row Name 03/06/17 1157 05/10/17 1454         Exercise Comments  First full day of exercise!  Patient was oriented to gym and equipment including functions, settings, policies, and procedures.  Patient's individual exercise prescription and treatment plan were reviewed.  All starting workloads were established based on the results of the 6 minute walk test done at initial orientation visit.  The plan for exercise progression was also introduced and progression will be customized based on patient's performance and goals.  Jancie would like to continue to work on the stepper or steps like we did today for weights.  She would like to get stronger on the steps and we will start making this part of her routine.          Exercise Goals and Review:  Exercise Goals    Row Name 02/27/17 1643             Exercise Goals   Increase Physical Activity  Yes       Intervention  Provide advice, education, support and counseling about physical activity/exercise needs.;Develop an individualized exercise prescription for aerobic and resistive training based on initial evaluation findings, risk stratification, comorbidities and participant's personal  goals.       Expected Outcomes  Achievement of increased cardiorespiratory fitness and enhanced flexibility, muscular endurance and strength shown through measurements of functional capacity and personal statement of participant.       Increase Strength and Stamina  Yes       Intervention  Provide advice, education, support and counseling about physical activity/exercise needs.;Develop an individualized exercise prescription for aerobic and resistive training based on initial evaluation findings, risk stratification, comorbidities and participant's personal goals.       Expected Outcomes  Achievement of increased cardiorespiratory fitness and enhanced flexibility, muscular endurance and strength shown through measurements of functional capacity and personal statement of participant.       Able to understand and use rate of perceived exertion (RPE) scale  Yes       Intervention  Provide education and explanation on how to use RPE scale       Expected Outcomes  Short Term: Able to use RPE daily in rehab to express subjective intensity level;Long Term:  Able to use RPE to guide intensity level when exercising independently       Able to understand and use Dyspnea scale  Yes       Intervention  Provide education and explanation on how to use Dyspnea scale       Expected Outcomes  Short Term: Able to use Dyspnea scale daily in rehab to express subjective sense of shortness of breath during exertion;Long Term: Able to use Dyspnea scale to guide intensity level when exercising independently       Knowledge and understanding of Target Heart Rate Range (THRR)  Yes       Intervention  Provide education and explanation of THRR including how the numbers were predicted and where they are located for reference       Expected Outcomes  Short Term: Able to state/look up THRR;Long Term: Able to use THRR to govern intensity when exercising independently;Short Term: Able to use daily as guideline for intensity in rehab        Able to check pulse independently  Yes       Intervention  Provide education and demonstration on how to check pulse in carotid and radial arteries.;Review the importance of being able to check your own pulse for safety during independent exercise       Expected Outcomes  Short Term: Able to explain why pulse checking is important during independent exercise;Long Term: Able to check pulse independently and accurately       Understanding of Exercise Prescription  Yes       Intervention  Provide education, explanation, and written materials on patient's individual exercise prescription       Expected Outcomes  Short Term: Able to explain program exercise prescription;Long Term: Able to explain home exercise prescription to exercise independently          Exercise Goals Re-Evaluation : Exercise Goals Re-Evaluation    Row Name 03/14/17 1323 03/15/17 1259 03/29/17 1442 04/11/17 1557 04/25/17 1414     Exercise Goal Re-Evaluation   Exercise Goals Review  Increase Physical  Activity;Increase Strength and Stamina  Increase Physical Activity;Able to understand and use Dyspnea scale;Understanding of Exercise Prescription;Knowledge and understanding of Target Heart Rate Range (THRR);Increase Strength and Stamina;Able to understand and use rate of perceived exertion (RPE) scale;Able to check pulse independently  Increase Physical Activity;Increase Strength and Stamina  Increase Physical Activity;Increase Strength and Stamina;Understanding of Exercise Prescription  Increase Physical Activity;Increase Strength and Stamina;Understanding of Exercise Prescription   Comments  Tonetta is off to a good start with rehab.  She has completed two full days of exercise.  She is now up to 5 min intervals on the treadmill.  We will continue to monitor her progress.  Reviewed home exercise with pt today.  Pt plans to walk and use the cross trainer at [employer] for exercise.  Reviewed THR, pulse, RPE, sign and symptoms, NTG  use, and when to call 911 or MD.  Also discussed weather considerations and indoor options.  Pt voiced understanding  Tequlia is off to a great start in rehab.  She is now up to 1.5 mph on the treadmill.  We will continue to monitor her progression.   Mylissa has continued to do well in rehab.  She has finally cut down her nail to be able to monitor her oxygen saturation with probe.  She is now up to level 5 on the NuStep.  We will continue monitor her progression.  Nevin has been doing well in rehab.  She is now up to 1.66mh on the treadmill and level 6 on the NuStep.  She is really getting moving!!  We will continue to monitor her progress.    Expected Outcomes  Short: Continue to build up stamina on treadmill.  Long: Continue to increase physical activity.   Short: adding one extra day of exercise a week. Long: Maintain a workout routine independently post [facility].  Short: Increase workloads on XR and NuStep.  Long: Get into routine of regular exercise.   Short: Try to increase spms on both XR and NuStep.  Long: Continue to exercise on her own.   Short: Continue to try to increase workload on XR.  Long: Continue to exercise regularly.    RElidaName 05/09/17 1535 05/23/17 1521 06/07/17 1458 06/21/17 0810       Exercise Goal Re-Evaluation   Exercise Goals Review  Increase Physical Activity;Increase Strength and Stamina;Understanding of Exercise Prescription  Increase Physical Activity;Increase Strength and Stamina;Understanding of Exercise Prescription  Increase Physical Activity;Increase Strength and Stamina;Understanding of Exercise Prescription  Increase Physical Activity;Increase Strength and Stamina;Understanding of Exercise Prescription    Comments  PZoehas continued to do well in rehab.  She gets excited when she is able to get her vitals herself now.  She has increased her rpms on the XR to 49!  We will continue to monitor her progression.   PKajalcontinues to do well in rehab.  She is now up to level  3 on the XR and maintaining her rpms.  We will try to increase her more on the treadmill and continue to monitor her progression.   PToshiahas been out since 12/20 but returned today.  She was out with the holidays and then was also sick some.  She was able to return to slightly reduced workloads without any problems.  We talked about increase her treadmill again on Friday.  We will continue to monitor her progression.   PLouizahas been doing well in rehab.  She has been able to return to her workloads.  We  will try to increase workloads once she returns from being out sick.  We will continue to monitor her progression.     Expected Outcomes  Short: Continue to increase workloads.  Long: Continue to exercise regularly.   Short: Increase workload on treadmill.   Long: Continue to increase physical activity.   Short: Increase workload on treadmill.  Long: Continue to exercise more at home.   Short: Continue to try to increase workloads.  Long: Continue to exercise more to build strength and stamina.        Discharge Exercise Prescription (Final Exercise Prescription Changes): Exercise Prescription Changes - 06/21/17 0800      Response to Exercise   Blood Pressure (Admit)  126/60    Blood Pressure (Exit)  126/68    Heart Rate (Admit)  85 bpm    Heart Rate (Exercise)  100 bpm    Heart Rate (Exit)  77 bpm    Oxygen Saturation (Admit)  99 %    Oxygen Saturation (Exercise)  97 %    Oxygen Saturation (Exit)  99 %    Rating of Perceived Exertion (Exercise)  13    Perceived Dyspnea (Exercise)  2    Symptoms  none    Duration  Continue with 45 min of aerobic exercise without signs/symptoms of physical distress.    Intensity  THRR unchanged      Progression   Progression  Continue to progress workloads to maintain intensity without signs/symptoms of physical distress.    Average METs  2.28      Resistance Training   Training Prescription  Yes    Weight  4 lbs    Reps  10-15      Interval Training     Interval Training  No      Treadmill   MPH  1.5    Grade  0    Minutes  15    METs  2.15      NuStep   Level  6    SPM  67    Minutes  15    METs  2.8      REL-XR   Level  3    Minutes  15    METs  1.9      Home Exercise Plan   Plans to continue exercise at  [employer] (comment) [employer]    Frequency  Add 2 additional days to program exercise sessions.    Initial Home Exercises Provided  03/15/17       Nutrition:  Target Goals: Understanding of nutrition guidelines, daily intake of sodium <1594m, cholesterol <2030m calories 30% from fat and 7% or less from saturated fats, daily to have 5 or more servings of fruits and vegetables.  Biometrics: Pre Biometrics - 02/27/17 1643      Pre Biometrics   Height  5' 7.5" (1.715 m)    Weight  151 lb 8 oz (68.7 kg)    Waist Circumference  33 inches    Hip Circumference  36.75 inches    Waist to Hip Ratio  0.9 %    BMI (Calculated)  23.36        Nutrition Therapy Plan and Nutrition Goals: Nutrition Therapy & Goals - 04/10/17 1006      Nutrition Therapy   Diet  Instructed on a meal plan including dietary guidelines for lung health and diabetes    Drug/Food Interactions  Statins/Certain Fruits    Protein (specify units)  6    Fiber  20 grams    Whole Grain Foods  3 servings    Saturated Fats  12 max. grams    Fruits and Vegetables  5 servings/day    Sodium  1500 grams      Personal Nutrition Goals   Nutrition Goal  Measure out some starchy foods. Count 1 cup serving as 2 servings of carbohydrate.    Personal Goal #2  Balance meals with protein, 2-3 servings of carbohydrate and non-starchy vegetables.    Personal Goal #3  Read labels for sodium.    Personal Goal #4  Limit sugar sweetened beverages.      Intervention Plan   Intervention  Prescribe, educate and counsel regarding individualized specific dietary modifications aiming towards targeted core components such as weight, hypertension, lipid  management, diabetes, heart failure and other comorbidities.;Nutrition handout(s) given to patient.    Expected Outcomes  Short Term Goal: Understand basic principles of dietary content, such as calories, fat, sodium, cholesterol and nutrients.;Short Term Goal: A plan has been developed with personal nutrition goals set during dietitian appointment.;Long Term Goal: Adherence to prescribed nutrition plan.       Nutrition Assessments: Nutrition Assessments - 02/27/17 1614      MEDFICTS Scores   Pre Score  65       Nutrition Goals Re-Evaluation: Nutrition Goals Re-Evaluation    Row Name 03/17/17 1247 05/03/17 1226 06/12/17 1259         Goals   Current Weight  152 lb 12.8 oz (69.3 kg)  -  161 lb (73 kg)     Nutrition Goal  Eat healthier and adhere to a diet plan  Jakyrah set a nutrition goal with the nutritionist where she learned about measuring food, reading labels, limiting sugar beverages and eating balanced meals. Her goal is to adhere to this plan  Limit sugar and watch sodium     Comment  Marilynn has a nutrition appointment November 5th.  -  Peggs has been eating better and her diabetes is more under control.     Expected Outcome  Short: Meet with the dietician. Long: Adhere to a nutrition plan  Short: continue to read food labels, eat  balanced meals, and limit sugar beverages. Long: adhere to her nutrition plan   Short: limit sugar and sodium Long: maintain a lower sugar and sodium diet.         Nutrition Goals Discharge (Final Nutrition Goals Re-Evaluation): Nutrition Goals Re-Evaluation - 06/12/17 1259      Goals   Current Weight  161 lb (73 kg)    Nutrition Goal  Limit sugar and watch sodium    Comment  Peggs has been eating better and her diabetes is more under control.    Expected Outcome  Short: limit sugar and sodium Long: maintain a lower sugar and sodium diet.        Psychosocial: Target Goals: Acknowledge presence or absence of significant depression and/or stress,  maximize coping skills, provide positive support system. Participant is able to verbalize types and ability to use techniques and skills needed for reducing stress and depression.   Initial Review & Psychosocial Screening: Initial Psych Review & Screening - 02/27/17 1511      Initial Review   Current issues with  Current Depression;Current Stress Concerns;Current Anxiety/Panic    Source of Stress Concerns  Chronic Illness;Family    Comments  Relationship not well with her husband      [place]?  Yes  Comments  Daughters show her support      Barriers   Psychosocial barriers to participate in program  The patient should benefit from training in stress management and relaxation.      Screening Interventions   Interventions  Yes;To provide support and resources with identified psychosocial needs;Provide feedback about the scores to participant;Encouraged to exercise;Program counselor consult    Expected Outcomes  Short Term goal: Utilizing psychosocial counselor, staff and physician to assist with identification of specific Stressors or current issues interfering with healing process. Setting desired goal for each stressor or current issue identified.;Long Term Goal: Stressors or current issues are controlled or eliminated.;Short Term goal: Identification and review with participant of any Quality of Life or Depression concerns found by scoring the questionnaire.;Long Term goal: The participant improves quality of Life and PHQ9 Scores as seen by post scores and/or verbalization of changes       Quality of Life Scores:  Scores of 19 and below usually indicate a poorer quality of life in these areas.  A difference of  2-3 points is a clinically meaningful difference.  A difference of 2-3 points in the total score of the Quality of Life Index has been associated with significant improvement in overall quality of life, self-image, physical symptoms, and general health  in studies assessing change in quality of life.  PHQ-9: Recent Review Flowsheet Data    Depression screen [facility] 2/9 05/01/2017 02/27/2017 11/29/2016 11/29/2016 07/22/2015   Decreased Interest _0 0   Down, Depressed, Hopeless _1 0   PHQ - 2 Score _2 0   Altered sleeping 0 0 3 3 -   Tired, decreased energy _3 -   Change in appetite _4 -   Feeling bad or failure about yourself  _5 -   Trouble concentrating _6 -   Moving slowly or fidgety/restless 0 0 0 0 -   Suicidal thoughts 0 _7 -   PHQ-9 Score _8 -   Difficult doing work/chores Not difficult at all Somewhat difficult - - -     Interpretation of Total Score  Total Score Depression Severity:  1-4 = Minimal depression, 5-9 = Mild depression, 10-14 = Moderate depression, 15-19 = Moderately severe depression, 20-27 = Severe depression   Psychosocial Evaluation and Intervention: Psychosocial Evaluation - 03/27/17 1240      Psychosocial Evaluation & Interventions   Interventions  Encouraged to exercise with the program and follow exercise prescription;Therapist referral;Relaxation education;Stress management education    Comments  Counselor met with Ms. Georgann Housekeeper [facility]) for initial psychosocial evaluation.  She is a 72 year old who has pulmonary hypertension.  Orine has a strong support system with a daughter and son who live locally.  She is also married but reports her marriage is not positive and he hasn't been supportive of her for the past 10 years.  [place] struggles with diabetes and sleep apnea and has a CPAP for the past 2 months to help with sleep - but she reports it does not.  She admits to a history of depression and anxiety for approximately 5-6 years.  She states her mood is negative most of the time and she is "unhappy" currently.  Demaya's primary stress is her marriage and her health.  Counselor assessed Klyn for SI/HI due to the report on the PHQ-9 of "18" indicating "moderately severe" for  depression and the answer to #9 with "thoughts of being better off dead or hurting yourself in some way" answered a "2" meaning she has these thoughts more than half the days.  Vilma states she will never hurt herself because she has (6) grandchildren that she adores and brings her joy.  Counselor encouraged Celines see a therapist in the near future and she reports she has a new contact that her insurance will cover now.  She agreed to contact this provider and get on their schedule asap.  Mitsuko also feels that her medications are too strong and she is seeing the Dr. about this on Thursday with hopes that some will be discontinued.  Counselor may recommend a psychiatrist if her mood does not improve in the near future due to her PHQ scores and her answer to #9.  Counselor will follow with Pixie throughout the course of this program.     Expected Outcomes  Dainelle will benefit from consistent exercise to achieve her stated goals to "get thru this program."  She will also benefit from the educational and psychoeducational components of this program to learn improved coping strategies to help with her mood and stress level.  Kamyla will contact and begin seeing a therpist in the near future.  She will speak to her Dr. about medication changes as well.  Counselor will follow.    Continue Psychosocial Services   Follow up required by counselor       Psychosocial Re-Evaluation: Psychosocial Re-Evaluation    [place] Name 03/24/17 1405 04/07/17 1248 05/01/17 1413 05/03/17 1246 05/08/17 1210     Psychosocial Re-Evaluation   Current issues with  Current Depression;Current Stress Concerns  Current Depression;Current Stress Concerns  Current Stress Concerns;Current Depression  -  -   Comments  Nikelle states that her home life has not changed since the start of the program. She states that if she could live alone she would. Peggs states she does not get along with her husband. She seems really depressed as of late. She states  that she has a doctors appointment for her depression.  Mackensey states that her husband is more like a roomate. She does not tell the family about there problems becuase she feels strong enough that she does not need to. She states this has been going on for many years. She comes to [place] becuase her doctor and her children want her to. She has been seeing a therapist but she will not be able to see her anymore due to her insurance changing. She pays for [employer] but does not go.  Informed her that it would be good to exercise when the program is over to maintain fitness and decrease stress.  Janeah states he has been doing better lately and has paid her bills. She states that she has not paid her bills in 7 months and she feels really good that she paid them recently. Her daughter helps pay the bills and she said her daughter was so happy for her. Informed her that she was doing a great job and that is a Film/video editor.  Counselor follow up with Serin today reporting some improvement in her mood - although still has depressive symptoms.   Jaella states she has not called a Social worker or psychiatrist yet but if planning to do so this week - since Thanksgiving is over.  She enjoys this program and is feeling some benefit from it.    Follow up by this Counselor with Vickii Chafe to  see if has reached out to her counselor yet.  She has tried - but no response so she plans to call a former female counselor and pay out of pocket - if she has to.  She is concerned about the steps to get to that office.  Counselor offered to research counselors who will take her health insurance and she seemed delighted.  Counselor provided [employer] with a list of (2) counselors locally who take [employer].  She agreed to call today to set an appointment.  Jovi states this program has been a good stress reducer for her.  Counselor assessed other coping strategies with relying on her faith; getting her hair and nails done and exercise.   She continues to report her stress and living situation is depressing and she has lived with it so long - she doesn't think it will change or that it is harming her in any way.  Counselor educated Clinical cytogeneticist on this and that is when she agreed to reach out to a Social worker.  She is more hesitant to consider a psychiatrist or medications at this time.  Counselor will follow.    Expected Outcomes  Short: Meet with the mental health counselor. Long: decrease PHQ9 scores.  Short:continue [facility] to decrease stress. Long: see a therapist to help cope with depression.  Short:continue financial independence. Long: continue budgeting to maintain financial balance.  Krina will call a counselor or psychiatrist to help with her depressive symptoms that are still evident.  She will continue to exercise consistently for her mood and overall health.  Cloteal was given two counselor locally who take her insurance to call. She agreed to reach out to one of them this week.  She will continue to exercise consistently.     Interventions  Encouraged to attend Pulmonary Rehabilitation for the exercise;Stress management education  Encouraged to attend Pulmonary Rehabilitation for the exercise  Encouraged to attend Pulmonary Rehabilitation for the exercise  -  Stress management education;Therapist referral   Continue Psychosocial Services   Follow up required by counselor  Follow up required by counselor  Follow up required by staff  Follow up required by staff  Follow up required by staff   [place] Name 06/12/17 1539             Psychosocial Re-Evaluation   Current issues with  Current Stress Concerns;Current Depression       Comments  Dajon states that things at home are the same. She wants a divorce from her husband but states she cant becuase she cannot afford to live on her own. Her holidays were good and she gets along with her kids very well. She still says that her and her husband are just roommates.       Expected Outcomes   Short: Attend [facility] to reduce stress. Long: maintain an exercise program post [facility] to decrease stress.       Interventions  Encouraged to attend Pulmonary Rehabilitation for the exercise       Continue Psychosocial Services   Follow up required by staff          Psychosocial Discharge (Final Psychosocial Re-Evaluation): Psychosocial Re-Evaluation - 06/12/17 1539      Psychosocial Re-Evaluation   Current issues with  Current Stress Concerns;Current Depression    Comments  Lillyona states that things at home are the same. She wants a divorce from her husband but states she cant becuase she cannot afford to live on her own. Her holidays were good and  she gets along with her kids very well. She still says that her and her husband are just roommates.    Expected Outcomes  Short: Attend [facility]ro[facility] to reduce stress. Long: maintain an exercise program post [facility] to decrease stress.    Interventions  Encouraged to attend Pulmonary Rehabilitation for the exercise    Continue Psychosocial Services   Follow up required by staff       Education: Education Goals: Education classes will be provided on a weekly basis, covering required topics. Participant will state understanding/return demonstration of topics presented.  Learning Barriers/Preferences: Learning Barriers/Preferences - 02/27/17 1643      Learning Barriers/Preferences   Learning Barriers  None    Learning Preferences  None       Education Topics: Initial Evaluation Education: - Verbal, written and demonstration of respiratory meds, RPE/PD scales, oximetry and breathing techniques. Instruction on use of nebulizers and MDIs: cleaning and proper use, rinsing mouth with steroid doses and importance of monitoring MDI activations.   Pulmonary Rehab from 06/23/2017 in [facility] Cardiac and Pulmonary Rehab  Date  02/27/17  Educator  [facility]  Instruction Review Code  1- Verbalizes Understanding      General Nutrition Guidelines/Fats and  Fiber: -Group instruction provided by verbal, written material, models and posters to present the general guidelines for heart healthy nutrition. Gives an explanation and review of dietary fats and fiber.   Pulmonary Rehab from 06/23/2017 in [facility] Cardiac and Pulmonary Rehab  Date  02/27/17  Educator  [facility]  Instruction Review Code  1- Verbalizes Understanding      Controlling Sodium/Reading Food Labels: -Group verbal and written material supporting the discussion of sodium use in heart healthy nutrition. Review and explanation with models, verbal and written materials for utilization of the food label.   Pulmonary Rehab from 06/23/2017 in [facility] Cardiac and Pulmonary Rehab  Date  05/22/17  Educator  [facility]  Instruction Review Code  1- Verbalizes Understanding      Exercise Physiology & Risk Factors: - Group verbal and written instruction with models to review the exercise physiology of the cardiovascular system and associated critical values. Details cardiovascular disease risk factors and the goals associated with each risk factor.   Aerobic Exercise & Resistance Training: - Gives group verbal and written discussion on the health impact of inactivity. On the components of aerobic and resistive training programs and the benefits of this training and how to safely progress through these programs.   Pulmonary Rehab from 06/23/2017 in [facility] Cardiac and Pulmonary Rehab  Date  06/23/17  Educator  [facility]  Instruction Review Code  1- Verbalizes Understanding      Flexibility, Balance, General Exercise Guidelines: - Provides group verbal and written instruction on the benefits of flexibility and balance training programs. Provides general exercise guidelines with specific guidelines to those with heart or lung disease. Demonstration and skill practice provided.   Stress Management: - Provides group verbal and written instruction about the health risks of elevated stress, cause of high stress, and  healthy ways to reduce stress.   Depression: - Provides group verbal and written instruction on the correlation between heart/lung disease and depressed mood, treatment options, and the stigmas associated with seeking treatment.   Pulmonary Rehab from 06/23/2017 in [facility] Cardiac and Pulmonary Rehab  Date  04/19/17  Educator  [facility]  Instruction Review Code  1- Verbalizes Understanding      Exercise & Equipment Safety: - Individual verbal instruction and demonstration of equipment use and  safety with use of the equipment.   Infection Prevention: - Provides verbal and written material to individual with discussion of infection control including proper hand washing and proper equipment cleaning during exercise session.   Pulmonary Rehab from 06/23/2017 in [facility] Cardiac and Pulmonary Rehab  Date  02/27/17  Educator  [facility]  Instruction Review Code  1- Verbalizes Understanding      Falls Prevention: - Provides verbal and written material to individual with discussion of falls prevention and safety.   Pulmonary Rehab from 06/23/2017 in [facility] Cardiac and Pulmonary Rehab  Date  02/27/17  Educator  [facility]  Instruction Review Code  1- Verbalizes Understanding      Diabetes: - Individual verbal and written instruction to review signs/symptoms of diabetes, desired ranges of glucose level fasting, after meals and with exercise. Advice that pre and post exercise glucose checks will be done for 3 sessions at entry of program.   Chronic Lung Diseases: - Group verbal and written instruction to review new updates, new respiratory medications, new advancements in procedures and treatments. Provide informative websites and "800" numbers of self-education.   Pulmonary Rehab from 06/23/2017 in [facility] Cardiac and Pulmonary Rehab  Date  06/07/17  Educator  [facility]  Instruction Review Code  1- Verbalizes Understanding      Lung Procedures: - Group verbal and written instruction to describe testing methods done to  diagnose lung disease. Review the outcome of test results. Describe the treatment choices: Pulmonary Function Tests, ABGs and oximetry.   Pulmonary Rehab from 06/23/2017 in [facility] Cardiac and Pulmonary Rehab  Date  05/10/17  Educator  [facility]  Instruction Review Code  1- Verbalizes Understanding      Energy Conservation: - Provide group verbal and written instruction for methods to conserve energy, plan and organize activities. Instruct on pacing techniques, use of adaptive equipment and posture/positioning to relieve shortness of breath.   Pulmonary Rehab from 06/23/2017 in [facility] Cardiac and Pulmonary Rehab  Date  05/03/17  Educator  [facility]  Instruction Review Code  1- Verbalizes Understanding      Triggers: - Group verbal and written instruction to review types of environmental controls: home humidity, furnaces, filters, dust mite/pet prevention, HEPA vacuums. To discuss weather changes, air quality and the benefits of nasal washing.   Pulmonary Rehab from 06/23/2017 in [facility] Cardiac and Pulmonary Rehab  Date  06/21/17  Educator  [facility]  Instruction Review Code  1- Verbalizes Understanding      Exacerbations: - Group verbal and written instruction to provide: warning signs, infection symptoms, calling MD promptly, preventive modes, and value of vaccinations. Review: effective airway clearance, coughing and/or vibration techniques. Create an Sports administrator.   Pulmonary Rehab from 06/23/2017 in [facility] Cardiac and Pulmonary Rehab  Date  06/21/17  Educator  [facility]  Instruction Review Code  1- Verbalizes Understanding      Oxygen: - Individual and group verbal and written instruction on oxygen therapy. Includes supplement oxygen, available portable oxygen systems, continuous and intermittent flow rates, oxygen safety, concentrators, and Medicare reimbursement for oxygen.   Respiratory Medications: - Group verbal and written instruction to review medications for lung disease. Drug class, frequency,  complications, importance of spacers, rinsing mouth after steroid MDI's, and proper cleaning methods for nebulizers.   AED/CPR: - Group verbal and written instruction with the use of models to demonstrate the basic use of the AED with the basic ABC's of resuscitation.   Breathing Retraining: - Provides individuals verbal and written instruction on purpose, frequency, and  proper technique of diaphragmatic breathing and pursed-lipped breathing. Applies individual practice skills.   Pulmonary Rehab from 06/23/2017 in [facility] Cardiac and Pulmonary Rehab  Date  02/27/17  Educator  [facility]  Instruction Review Code  1- Verbalizes Understanding      Anatomy and Physiology of the Lungs: - Group verbal and written instruction with the use of models to provide basic lung anatomy and physiology related to function, structure and complications of lung disease.   Pulmonary Rehab from 06/23/2017 in [facility] Cardiac and Pulmonary Rehab  Date  05/10/17  Educator  [facility]  Instruction Review Code  1- Verbalizes Understanding      Anatomy & Physiology of the Heart: - Group verbal and written instruction and models provide basic cardiac anatomy and physiology, with the coronary electrical and arterial systems. Review of: AMI, Angina, Valve disease, Heart Failure, Cardiac Arrhythmia, Pacemakers, and the ICD.   Pulmonary Rehab from 06/23/2017 in [facility] Cardiac and Pulmonary Rehab  Date  04/21/17  Educator  [facility]  Instruction Review Code  1- Verbalizes Understanding      Heart Failure: - Group verbal and written instruction on the basics of heart failure: signs/symptoms, treatments, explanation of ejection fraction, enlarged heart and cardiomyopathy.   Pulmonary Rehab from 06/23/2017 in [facility] Cardiac and Pulmonary Rehab  Date  04/21/17  Educator  [place]  Instruction Review Code  1- Verbalizes Understanding      Sleep Apnea: - Individual verbal and written instruction to review Obstructive Sleep Apnea. Review of risk  factors, methods for diagnosing and types of masks and machines for OSA.   Pulmonary Rehab from 06/23/2017 in [facility] Cardiac and Pulmonary Rehab  Date  02/27/17  Educator  [facility]  Instruction Review Code  1- Verbalizes Understanding      Anxiety: - Provides group, verbal and written instruction on the correlation between heart/lung disease and anxiety, treatment options, and management of anxiety.   Relaxation: - Provides group, verbal and written instruction about the benefits of relaxation for patients with heart/lung disease. Also provides patients with examples of relaxation techniques.   Pulmonary Rehab from 06/23/2017 in [facility] Cardiac and Pulmonary Rehab  Date  03/22/17  Educator  [facility]  Instruction Review Code  1- Verbalizes Understanding      Cardiac Medications: - Group verbal and written instruction to review commonly prescribed medications for heart disease. Reviews the medication, class of the drug, and side effects.   Pulmonary Rehab from 06/23/2017 in [facility] Cardiac and Pulmonary Rehab  Date  05/12/17  Educator  [place]  Instruction Review Code  1- Verbalizes Understanding      Know Your Numbers: -Group verbal and written instruction about important numbers in your health.  Review of Cholesterol, Blood Pressure, Diabetes, and BMI and the role they play in your overall health.   Pulmonary Rehab from 06/23/2017 in [facility] Cardiac and Pulmonary Rehab  Date  06/14/17  Educator  [facility]  Instruction Review Code  1- Verbalizes Understanding      Other: -Provides group and verbal instruction on various topics (see comments)    Knowledge Questionnaire Score: Knowledge Questionnaire Score - 02/27/17 1519      Knowledge Questionnaire Score   Pre Score  8/10        Core Components/Risk Factors/Patient Goals at Admission: Personal Goals and Risk Factors at Admission - 02/27/17 1526      Core Components/Risk Factors/Patient Goals on Admission    Weight Management  Weight Maintenance;Yes      Intervention  Weight Management: Develop  a combined nutrition and exercise program designed to reach desired caloric intake, while maintaining appropriate intake of nutrient and fiber, sodium and fats, and appropriate energy expenditure required for the weight goal.;Weight Management: Provide education and appropriate resources to help participant work on and attain dietary goals.;Weight Management/Obesity: Establish reasonable short term and long term weight goals.    Admit Weight  151 lb 8 oz (68.7 kg)    Goal Weight: Short Term  146 lb (66.2 kg)    Goal Weight: Long Term  150 lb (68 kg)    Expected Outcomes  Short Term: Continue to assess and modify interventions until short term weight is achieved;Long Term: Adherence to nutrition and physical activity/exercise program aimed toward attainment of established weight goal;Weight Maintenance: Understanding of the daily nutrition guidelines, which includes 25-35% calories from fat, 7% or less cal from saturated fats, less than 225m cholesterol, less than 1.5gm of sodium, & 5 or more servings of fruits and vegetables daily;Understanding recommendations for meals to include 15-35% energy as protein, 25-35% energy from fat, 35-60% energy from carbohydrates, less than 2014mof dietary cholesterol, 20-35 gm of total fiber daily;Understanding of distribution of calorie intake throughout the day with the consumption of 4-5 meals/snacks    Diabetes  Yes    Intervention  Provide education about signs/symptoms and action to take for hypo/hyperglycemia.;Provide education about proper nutrition, including hydration, and aerobic/resistive exercise prescription along with prescribed medications to achieve blood glucose in normal ranges: Fasting glucose 65-99 mg/dL    Expected Outcomes  Short Term: Participant verbalizes understanding of the signs/symptoms and immediate care of hyper/hypoglycemia, proper foot care and importance of medication, aerobic/resistive exercise  and nutrition plan for blood glucose control.;Long Term: Attainment of HbA1C < 7%.    Hypertension  Yes    Intervention  Provide education on lifestyle modifcations including regular physical activity/exercise, weight management, moderate sodium restriction and increased consumption of fresh fruit, vegetables, and low fat dairy, alcohol moderation, and smoking cessation.;Monitor prescription use compliance.    Expected Outcomes  Short Term: Continued assessment and intervention until BP is < 140/9014mG in hypertensive participants. < 130/41m56m in hypertensive participants with diabetes, heart failure or chronic kidney disease.;Long Term: Maintenance of blood pressure at goal levels.    Stress  Yes    Intervention  Offer individual and/or small group education and counseling on adjustment to heart disease, stress management and health-related lifestyle change. Teach and support self-help strategies.;Refer participants experiencing significant psychosocial distress to appropriate mental health specialists for further evaluation and treatment. When possible, include family members and significant others in education/counseling sessions.    Expected Outcomes  Short Term: Participant demonstrates changes in health-related behavior, relaxation and other stress management skills, ability to obtain effective social support, and compliance with psychotropic medications if prescribed.;Long Term: Emotional wellbeing is indicated by absence of clinically significant psychosocial distress or social isolation.       Core Components/Risk Factors/Patient Goals Review:  Goals and Risk Factor Review    Row Name 03/24/17 1358 04/12/17 1226 05/03/17 1222 05/24/17 1240 06/12/17 1254     Core Components/Risk Factors/Patient Goals Review   Personal Goals Review  Weight Management/Obesity;Improve shortness of breath with ADL's;Diabetes;Stress;Hypertension  Weight Management/Obesity;Stress;Diabetes;Hypertension   Stress;Diabetes;Hypertension;Weight Management/Obesity  Diabetes  Diabetes;Weight Management/Obesity;Improve shortness of breath with ADL's   Review  PeggNuri had alot of stress dealing with her home situation. She states her husband and her do not get along and she would rather not be living with him. She  states her diabetes has been under control and she checks her levels at home.  Her ADL's have not  improved much since the start of the program .   Shirell is happy with her weight of 156, she wishes to maintain in the 150s-160s. She states her blood pressure has been doing really well since starting this program, even with the continual stress related to her home life. She has reported a slight increase in energy while doing her ADL's and cooking, which she is very proud of herself for! Her stress has not gotten any better and she does not see that changing anytime soon.  Chamika saw the nutritionist recently and was given ideas of what food to eat to get the most nutritious value from it, since she does not eat that much food. She is still happy with her weight. She recently had her doctor appointment where he said she was doing very well with her progress. She admits to wearing her CPAP most nights, but knows she needs ot be wearing it every night. Her diabetes is still doing well and so is her blood pressure. There is still no change in her stress level, but she is practicing ways relax.  Genavive went to her doctor yesterday. Her A1C previously was 8.5 and now it has gone down to 6.8. She is very pleased with her results and motivated to continue exercising and eating healthier.   Zanovia has lost some weight since she has been in class. She says she has been eating alot of soup. Her ADLS have improved slightly. She wants to be able to do things without getting too short of breath.   Expected Outcomes  Short: attend regularly to decrease stress. Long: Maintain a healthy stress free home.  Short: attend [facility]  regularly to help improve ADL stamina, continue to maintain weight, decrease blood pressure, improve A1C, and help decrease stress. Long: continue to practice self care habits  Short: finish [facility] and attend all education topics during class, wear her CPAP at night Long: continue to practice self care and make plans as to what she is doing after this program to keep up all her hard work.   Short: continue program Long: for her A1C to continue to decrease post program  Short: Lose weight. Long: Maintain weight loss.      Core Components/Risk Factors/Patient Goals at Discharge (Final Review):  Goals and Risk Factor Review - 06/12/17 1254      Core Components/Risk Factors/Patient Goals Review   Personal Goals Review  Diabetes;Weight Management/Obesity;Improve shortness of breath with ADL's    Review  Audi has lost some weight since she has been in class. She says she has been eating alot of soup. Her ADLS have improved slightly. She wants to be able to do things without getting too short of breath.    Expected Outcomes  Short: Lose weight. Long: Maintain weight loss.       ITP Comments: ITP Comments    Row Name 02/27/17 1626 03/06/17 1240 03/08/17 1312 03/27/17 0834 04/24/17 0851   ITP Comments  Medical evaluation completed. Chart sent to Dr. Emily Filbert director of [place] for signature and review.  Spent 15 minutes talking with Seva about PLB.  Channing Delsa Walder did not complete her rehab session. Her pre exercise CBG was 72, crackers given. CBG went down to 61, gel given. Her CBG went up to 82. Patient given education on importance of protein and checking her sugar regularly. She was instructed  to eat lunch right when she gets home.   30 day review completed. ITP sent to Dr. Emily Filbert Director of [place]. Continue with ITP unless changes are made by physician.    30 day review completed. ITP sent to Dr. Emily Filbert Director of [place]. Continue with ITP unless changes are made by  physician.     Row Name 05/22/17 [phone] 06/14/17 1253 06/19/17 0833 06/21/17 0810 06/23/17 1431   ITP Comments  30 day review completed. ITP sent to Dr. Emily Filbert Director of [place]. Continue with ITP unless changes are made by physician.    Spent 15 minutes with Ophelia talking to her about her PAH and COPD. Gave her information about causes of COPD.  30 day review completed. ITP sent to Dr. Emily Filbert Director of [place]. Continue with ITP unless changes are made by physician.  Jasman was sent home on Friday as she was still not feeling 100%!  She called out on Monday as well.   Texie stated that she would not be back to [facility] after exercise today. She was unable to elaborate on why. Asked her if it was ok to check up on her and she said that would be ok. After she is checked upon next week, she will be discharged if she intends not to come back.   [place] Name 06/26/17 0831 06/27/17 1016 06/27/17 1019       ITP Comments  Our mental health counselor will call Mikalia today to see if she is doing ok and if she wants to resume [facility].  Mental health counselor talked to Luree today and she will not be returning the [place]. She states she has an appointment today with her therapist today. Informed her to call if there is anything we can do for her.  Discharge ITP sent and signed by Dr. Sabra Heck.  Discharge Summary routed to PCP and pulmonologist.        Comments: discharge ITP

## 2017-06-30 ENCOUNTER — Telehealth: Payer: Self-pay | Admitting: Family Medicine

## 2017-06-30 NOTE — Telephone Encounter (Signed)
Patient needs refill on  Lorazepam 0.5 mg tab  Faxed to [facility] [phone]  thanks

## 2017-06-30 NOTE — Telephone Encounter (Signed)
She is no longer an active pt with [place]

## 2017-06-30 NOTE — Telephone Encounter (Signed)
Your patient

## 2017-08-29 ENCOUNTER — Ambulatory Visit: Payer: Medicare Other | Admitting: Internal Medicine

## 2017-08-29 ENCOUNTER — Encounter: Payer: Self-pay | Admitting: Internal Medicine

## 2017-08-29 VITALS — BP 124/68 | HR 86 | Ht 67.0 in | Wt 164.0 lb

## 2017-08-29 DIAGNOSIS — J449 Chronic obstructive pulmonary disease, unspecified: Secondary | ICD-10-CM

## 2017-08-29 DIAGNOSIS — G4733 Obstructive sleep apnea (adult) (pediatric): Secondary | ICD-10-CM

## 2017-08-29 NOTE — Patient Instructions (Signed)
Continue walkd twice daily Use NEB therapy 3 times daily  Follow up with PCP for Anemia and thyroid

## 2017-08-29 NOTE — Progress Notes (Signed)
Name: Jamie Murray MRN: [id number] DOB: 1945-07-15    CONSULTATION DATE: 08/29/2017   REFERRING MD : Julian Hy NP  CHIEF COMPLAINT: follow up Pulmonary HTN/COPD/OSA  Previous medical history Patient did not have MI but was told to see cardiologist as outpatient-patient underwent cardiac work up and lab testing Patient obtained ECHO and was noted in report to have severe pulmonary HTN  Patient has been having SOB and DOE for several months with LE swelling She also feels very tired all the time She sees [facility] for 10 years  She was recently DX with OSA on CPAP therapy 10 cm h20 AHI is 0.9 with current therapy  She has been having lower ext swelling for several months She has extensive second hand smoking history for 26 years    History of present illness Patient here for follow-up  Patient feels 90% better since last OV She feels well She has no SOB, no CHF exacerbation No signs of infection  FEV1 FEC ratio of 86% predicted Has FEV1 FEV1 of 1.25 L at 50% predicted her FVC is 1.46 L which is 46% predicted her TLC is 78% predicted DLCO is 64% however it is 113% predicted when uncorrected for volumes Interpretation: moderate restrictive lung disease with a positive bronchodilator response findings to suggest underlying reactive airways disease and small airways obstructive disease with bronchodilator response Flow volume loops suggest scooping of the expiratory limb Findings suggest underlying mild COPD Patient states that she had a nebulizer treatment and that has significantly improved her shortness of breath Her PCP obtained a CT abdomen which shows ascites and pleural effusion and etiology is still unknown Patient probably has pleural effusions does not  any type of invasive procedure at this time  Patient has fatigue and end of exertion and walks She was not hypoxic when  I Ambulated with patient she started to walk and then after 5 minutes she became  SOB and fatigued  I have explained to patient that this is more likely related to deconditioned state and advanced age  REVIEW OF SYSTEMS:   Constitutional: Negative for fever, chills, weight loss, +malaise/fatigue   HENT: Negative for hearing loss, ear pain, nosebleeds, congestion, sore throat, neck pain, tinnitus and ear discharge.   Eyes: Negative for blurred vision, double vision, photophobia, pain, discharge and redness.  Respiratory: -hemoptysis, -sputum production, -shortness of breath, -wheezing and- stridor.   Cardiovascular: Negative for chest pain, palpitations, orthopnea, claudication, -leg swelling and -PND.  Gastrointestinal: Negative for heartburn, nausea, vomiting, abdominal pain, diarrhea, constipation, blood in stool and melena.      ALL OTHER ROS ARE NEGATIVE   BP 124/68 (BP Location: Left Arm, Cuff Size: Normal)   Pulse 86   Ht _0  (1.702 m)   Wt 164 lb (74.4 kg)   LMP  (LMP Unknown)   SpO2 98%   BMI 25.69 kg/m  BP 124/68 (BP Location: Left Arm, Cuff Size: Normal)   Pulse 86   Ht _1  (1.702 m)   Wt 164 lb (74.4 kg)   LMP  (LMP Unknown)   SpO2 98%   BMI 25.69 kg/m    Physical Examination:   GENERAL:NAD, no fevers, chills, + fatigue-weakness HEAD: Normocephalic, atraumatic.  EYES: Pupils equal, round, reactive to light. Extraocular muscles intact. No scleral icterus.  MOUTH: Moist mucosal membrane.   EAR, NOSE, THROAT: Clear without exudates. No external lesions.  NECK: Supple. No thyromegaly. No nodules. No JVD.  PULMONARY:CTA B/L no wheezes, no crackles,  no rhonchi CARDIOVASCULAR: S1 and S2. Regular rate and rhythm. No murmurs, rubs, or gallops. No edema.  GASTROINTESTINAL: Soft, nontender, nondistended. No masses. Positive bowel sounds.  PSYCHIATRIC: Mood, affect within normal limits. The patient is awake, alert and oriented x 3. Insight, judgment intact.     I have Independently reviewed images of CXR on 08/09/2016     Interpretation:No  infiltrate or pulmonary edema   ASSESSMENT / PLAN: 72 yo AAF with resolved SOB and DOE Patient previously has had lower ext swelling and edema with signs of cor pulmonale with elevated pulm artery pressures on ECHO with dx of underlying OSA with extensive second hand smoke exposure  At this time I believe her elevated PA pressures on echocardiogram are  related to her underlying intermittent reactive airways disease with small airways obstructive disease along with her diagnosis of obstructive sleep apnea which correlates with secondary pulmonary hypertension related to other underlying disorders. In the setting of a normal 6 minute walk test patient does not have exertional hypoxia  She states that she feels well at this time but still has some fatigue Her CPAP compliance reports show 100% compliance, 4HR 90% compliance with AHI 0.3   1.SOB/DOE-resolved Her previous complaints of SOB were from Mild COPD , diastolic dysfunction  2.OSA on CPAP -continue CPAP therapy of 10 cm h20 with AHI 0.9 Patient is greatly benefiting and using her CPAP nightly   3.Extensive smoking exposure mild small airways obstructive disease -Avoid secondhand smoke -DuoNeb's at night -ALburterol in AM -no indication for inhaled steroids at this time  4.elevated Pulm art pressures, cor pulmonale with effusions After further assessment I do believe her PA pressures are related to underlying sleep apnea as well as small airways obstructive disease Will defer cardiology assessment at this time and will wait for repeat an echocardiogram reports to assess PA pressures  Continue Lasix as prescribed  5. ascites with effusions-resolved  6.deconditioned state Continue rehab program In rase exercise walks twice daily and recommend weight loss   7.Fatigue-her persistent fatigue is NOT related  To her underlying Mild COPD Her OSA is well under control at this time  Patient has anemia and thyroid dysfunction that  needs to be addressed Patient will also need Vit D Levels to be checked and to follow up with her PCP   Follow-up in 6 months    Patient  satisfied with Plan of action and management. All questions answered  Corrin Parker, M.D.  [facility]  Medical Director [place] Director [facility] Cardio-Pulmonary Department

## 2017-09-04 ENCOUNTER — Other Ambulatory Visit: Payer: Self-pay | Admitting: Internal Medicine

## 2017-09-04 DIAGNOSIS — Z1231 Encounter for screening mammogram for malignant neoplasm of breast: Secondary | ICD-10-CM

## 2017-09-14 ENCOUNTER — Ambulatory Visit: Payer: Medicare Other | Admitting: Podiatry

## 2017-09-21 ENCOUNTER — Encounter: Payer: Self-pay | Admitting: Podiatry

## 2017-09-21 ENCOUNTER — Ambulatory Visit: Payer: Medicare Other | Admitting: Podiatry

## 2017-09-21 DIAGNOSIS — L603 Nail dystrophy: Secondary | ICD-10-CM

## 2017-09-21 DIAGNOSIS — E119 Type 2 diabetes mellitus without complications: Secondary | ICD-10-CM

## 2017-09-22 NOTE — Progress Notes (Signed)
This patient presents to the office for an evaluation of her big toenail, left foot.  She says that her nail fell off her left foot in October.  She says it is healed and she is having no drainage or pus, redness or infection.  She says she is diabetic and has a  family history of amputation of both  feet and legs.  She presents the office to have her previously injured toenail, left foot evaluated professionally.  General Appearance  Alert, conversant and in no acute stress.  Vascular  Dorsalis pedis and posterior tibial  pulses are palpable  bilaterally.  Capillary return is within normal limits  bilaterally. Temperature is within normal limits  bilaterally.  Neurologic  Senn-Weinstein monofilament wire test within normal limits  bilaterally. Muscle power within normal limits bilaterally.  Nails her left hallux toenail is healing uneventfully.  The distal nail plate is absent but there is no evidence of pathology occurring.  Orthopedic  No limitations of motion of motion feet .  No crepitus or effusions noted.  HAV  B/L.  Skin  normotropic skin with no porokeratosis noted bilaterally.  No signs of infections or ulcers noted.  Callus left foot asymptomatic.   Nail Dystrophy  Diabetes with no complication.  ROV.  Evaluation of her concerned nail plate reveals no pathology.  I suggested she return to the office every 3 months since she is very concerned about the status of her diabetic feet. RTC 3 months    Gardiner Barefoot DPM

## 2017-11-02 ENCOUNTER — Ambulatory Visit
Admission: RE | Admit: 2017-11-02 | Discharge: 2017-11-02 | Disposition: A | Discharge status: Home / Self Care | Payer: Medicare Other | Source: Ambulatory Visit | Attending: Internal Medicine | Admitting: Internal Medicine

## 2017-11-02 DIAGNOSIS — Z1231 Encounter for screening mammogram for malignant neoplasm of breast: Secondary | ICD-10-CM | POA: Insufficient documentation

## 2017-12-21 ENCOUNTER — Ambulatory Visit: Payer: Medicare Other | Admitting: Podiatry

## 2018-01-05 NOTE — Progress Notes (Signed)
Needs to change PCP on insurance card.

## 2018-03-07 ENCOUNTER — Other Ambulatory Visit: Payer: Self-pay | Admitting: Internal Medicine

## 2018-03-13 ENCOUNTER — Encounter: Payer: Self-pay | Admitting: Internal Medicine

## 2018-03-13 ENCOUNTER — Ambulatory Visit: Payer: Medicare Other | Admitting: Internal Medicine

## 2018-03-13 VITALS — BP 124/64 | HR 68 | Resp 16 | Ht 67.0 in | Wt 171.0 lb

## 2018-03-13 DIAGNOSIS — G4733 Obstructive sleep apnea (adult) (pediatric): Secondary | ICD-10-CM | POA: Diagnosis not present

## 2018-03-13 NOTE — Progress Notes (Signed)
Name: Jamie Murray MRN: [id number] DOB: [birth year]    CONSULTATION DATE: 03/13/2018   REFERRING MD : Julian Hy NP  CHIEF COMPLAINT: follow up Pulmonary HTN/COPD/OSA  Previous medical history Patient did not have MI but was told to see cardiologist as outpatient-patient underwent cardiac work up and lab testing Patient obtained ECHO and was noted in report to have severe pulmonary HTN  Patient has been having SOB and DOE for several months with LE swelling She also feels very tired all the time She sees [facility] for 10 years  She was recently DX with OSA on CPAP therapy 10 cm h20 AHI is 0.9 with current therapy  She has been having lower ext swelling for several months She has extensive second hand smoking history for 26 years  FEV1 FEC ratio of 86% predicted Has FEV1 FEV1 of 1.25 L at 50% predicted her FVC is 1.46 L which is 46% predicted her TLC is 78% predicted DLCO is 64% however it is 113% predicted when uncorrected for volumes Interpretation: moderate restrictive lung disease with a positive bronchodilator response findings to suggest underlying reactive airways disease and small airways obstructive disease with bronchodilator response Flow volume loops suggest scooping of the expiratory limb Findings suggest underlying mild COPD Patient states that she had a nebulizer treatment and that has significantly improved her shortness of breath Her PCP obtained a CT abdomen which shows ascites and pleural effusion and etiology is still unknown Patient probably has pleural effusions does not  any type of invasive procedure at this time   HPI Patient here for follow-up  Patient has no acute complaints No signs of infection at this time Denies shortness of breath No CHF exacerbation  Feels well Patient has diagnosis of sleep apnea and is compliant with her CPAP machine She uses her CPAP and benefits from her therapy  Compliance report as follows 100% at 30  days 97% more than 4 hours AHI is 1  Findings discussed with patient  Patient had multiple questions about her nebulizer and albuterol therapy I have explained to her that she needs to use them as needed for cough wheezing or shortness of breath  At this time I have suggested her a trial run of inhaler therapy prior to exercise if she needs it or not   Review of Systems:  Gen:  Denies  fever, sweats, chills weigh loss  HEENT: Denies blurred vision, double vision, ear pain, eye pain, hearing loss, nose bleeds, sore throat Cardiac:  No dizziness, chest pain or heaviness, chest tightness,edema, No JVD Resp:   -shortness of breath,-wheezing, -hemoptysis,  Gi: Denies swallowing difficulty, stomach pain, nausea or vomiting, diarrhea, constipation, bowel incontinence Gu:  Denies bladder incontinence, burning urine Ext:   Denies Joint pain, stiffness or swelling Skin: Denies  skin rash, easy bruising or bleeding or hives Endoc:  Denies polyuria, polydipsia , polyphagia or weight change Psych:   Denies depression, insomnia or hallucinations  Other:  All other systems negative    ALL OTHER ROS ARE NEGATIVE  BP 124/64 (BP Location: Left Arm)   Pulse 68   Resp 16   Ht _0  (1.702 m)   Wt 171 lb (77.6 kg)   LMP  (LMP Unknown)   SpO2 96%   BMI 26.78 kg/m     Physical Examination:   GENERAL:NAD, no fevers, chills, no weakness no fatigue HEAD: Normocephalic, atraumatic.  EYES: Pupils equal, round, reactive to light. Extraocular muscles intact. No scleral icterus.  MOUTH:  Moist mucosal membrane. Dentition intact. No abscess noted.  EAR, NOSE, THROAT: Clear without exudates. No external lesions.  NECK: Supple. No thyromegaly. No nodules. No JVD.  PULMONARY: CTA B/L no wheezing, rhonchi, crackles CARDIOVASCULAR: S1 and S2. Regular rate and rhythm. No murmurs, rubs, or gallops. No edema. Pedal pulses 2+ bilaterally.  GASTROINTESTINAL: Soft, nontender, nondistended. No masses.  Positive bowel sounds. No hepatosplenomegaly.  MUSCULOSKELETAL: No swelling, clubbing, or edema. Range of motion full in all extremities.  NEUROLOGIC: Cranial nerves II through XII are intact. No gross focal neurological deficits. Sensation intact. Reflexes intact.  SKIN: No ulceration, lesions, rashes, or cyanosis. Skin warm and dry. Turgor intact.  PSYCHIATRIC: Mood, affect within normal limits. The patient is awake, alert and oriented x 3. Insight, judgment intact.  ALL OTHER ROS ARE NEGATIVE        I have Independently reviewed images of CXR on 08/09/2016     Interpretation:No infiltrate or pulmonary edema   ASSESSMENT / PLAN:  72 year old African-American female presents seen in the office for shortness of breath and dyspnea on exertion associated with lower extremity swelling and edema with some signs of cor pulmonale and elevated pulmonary pressures on echocardiogram with a diagnosis of underlying sleep apnea and a history of extensive secondhand smoke exposure  Her elevated PA pressures were most likely related to intermittent reactive airways disease and diagnosis of sleep apnea, patient had a previous 6-minute walk test that was within normal limits   She states that she feels well at this time but still has some fatigue Her CPAP compliance reports show 100% compliance, 4HR 97% compliance with AHI 1  #1 shortness of breath and dyspnea on exertion has resolved She has underlying mild COPD as well as diastolic dysfunction At this time I do not think she needs a repeat echocardiogram   #2 OSA on CPAP Continue CPAP therapy at 10 cm of water pressure with AHI of 1 Patient is greatly benefiting and using her CPAP nightly    #3 Extensive smoking exposure mild small airways obstructive disease -Avoid secondhand smoke -DuoNeb's as needed -ALburterol as needed -no indication for inhaled steroids at this time  4.h/o elevated Pulm art pressures, cor pulmonale with  effusions After further assessment I do believe her PA pressures are elevated and related to underlying sleep apnea as well as small airways obstructive disease However I do not feel that she needs a repeat echocardiogram at this time given the fact that she does not have any overt respiratory or cardiac issues -We will defer plan of care to cardiology -Continue Lasix as prescribed  5. ascites with effusions-resolved  6.deconditioned state -Exercise as tolerated    Follow-up in 6 months    Patient  satisfied with Plan of action and management. All questions answered  Corrin Parker, M.D.  [facility]  Medical Director [place] Director [facility] Cardio-Pulmonary Department

## 2018-03-13 NOTE — Patient Instructions (Signed)
Continue CPAP therapy as prescribed Use ALBUTEROL and NEB as needed for cough and wheezing and SOB

## 2018-04-10 ENCOUNTER — Ambulatory Visit: Payer: Medicare Other | Admitting: Podiatry

## 2018-04-10 ENCOUNTER — Encounter: Payer: Self-pay | Admitting: Podiatry

## 2018-04-10 DIAGNOSIS — L603 Nail dystrophy: Secondary | ICD-10-CM | POA: Diagnosis not present

## 2018-04-12 NOTE — Progress Notes (Signed)
   Subjective: 72 year old female presenting today with a chief complaint of left great toenail pain that began two weeks ago. She states the nail is beginning to pull back from the nail bed. She reports associated discoloration of the nail. Touching and wearing socks increases the soreness. She has been applying Neosporin for treatment. Patient is here for further evaluation and treatment.   Past Medical History:  Diagnosis Date  . Allergy   . Anemia   . Anxiety   . Depression   . Diabetes ([place])   . GERD (gastroesophageal reflux disease)   . Hyperlipidemia   . Hypertension   . Insomnia   . Migraines   . Osteoporosis   . [facility] spotted fever   . Transient cerebral ischemia   . Vertigo    every 2-3 months    Objective:  General: Well developed, nourished, in no acute distress, alert and oriented x3   Dermatology: Partially detached nail plate of the left hallux. Skin is warm, dry and supple bilateral lower extremities. Negative for open lesions or macerations.  Vascular: Dorsalis Pedis artery and Posterior Tibial artery pedal pulses palpable. No lower extremity edema noted.   Neruologic: Grossly intact via light touch bilateral.  Musculoskeletal: Muscular strength within normal limits in all groups bilateral. Normal range of motion noted to all pedal and ankle joints.   Assessment:  1. Partially detached nail plate left hallux   Plan of Care:  1. Patient evaluated.  2. Discussed treatment alternatives and plan of care. Explained nail avulsion procedure and post procedure course to patient. 3. Patient opted for total temporary nail avulsion of the left great toenail.  4. Prior to procedure, local anesthesia infiltration utilized using 3 ml of a 50:50 mixture of 2% plain lidocaine and 0.5% plain marcaine in a normal hallux block fashion and a betadine prep performed.  5. Light dressing applied. 6. Return to clinic as needed.   Edrick Kins, DPM [facility]  Dr. Edrick Kins, [address]                                        [place], [place] [address]                Office [phone]  Fax [phone]

## 2018-04-16 ENCOUNTER — Emergency Department: Payer: Medicare Other

## 2018-04-16 ENCOUNTER — Encounter: Payer: Self-pay | Admitting: Emergency Medicine

## 2018-04-16 ENCOUNTER — Emergency Department
Admission: EM | Admit: 2018-04-16 | Discharge: 2018-04-16 | Disposition: A | Discharge status: Home / Self Care | Payer: Medicare Other | Attending: Emergency Medicine | Admitting: Emergency Medicine

## 2018-04-16 ENCOUNTER — Other Ambulatory Visit: Payer: Self-pay

## 2018-04-16 DIAGNOSIS — E039 Hypothyroidism, unspecified: Secondary | ICD-10-CM | POA: Diagnosis not present

## 2018-04-16 DIAGNOSIS — I10 Essential (primary) hypertension: Secondary | ICD-10-CM | POA: Diagnosis not present

## 2018-04-16 DIAGNOSIS — M79631 Pain in right forearm: Secondary | ICD-10-CM | POA: Diagnosis present

## 2018-04-16 DIAGNOSIS — Z79899 Other long term (current) drug therapy: Secondary | ICD-10-CM | POA: Insufficient documentation

## 2018-04-16 DIAGNOSIS — E119 Type 2 diabetes mellitus without complications: Secondary | ICD-10-CM | POA: Insufficient documentation

## 2018-04-16 DIAGNOSIS — Z7982 Long term (current) use of aspirin: Secondary | ICD-10-CM | POA: Insufficient documentation

## 2018-04-16 DIAGNOSIS — M79609 Pain in unspecified limb: Secondary | ICD-10-CM

## 2018-04-16 DIAGNOSIS — R202 Paresthesia of skin: Secondary | ICD-10-CM | POA: Insufficient documentation

## 2018-04-16 DIAGNOSIS — Z8673 Personal history of transient ischemic attack (TIA), and cerebral infarction without residual deficits: Secondary | ICD-10-CM | POA: Insufficient documentation

## 2018-04-16 DIAGNOSIS — Z7902 Long term (current) use of antithrombotics/antiplatelets: Secondary | ICD-10-CM | POA: Diagnosis not present

## 2018-04-16 DIAGNOSIS — Z7984 Long term (current) use of oral hypoglycemic drugs: Secondary | ICD-10-CM | POA: Insufficient documentation

## 2018-04-16 LAB — CBC WITH DIFFERENTIAL/PLATELET
Abs Immature Granulocytes: 0.02 10*3/uL (ref 0.00–0.07)
BASOS PCT: 0 %
Basophils Absolute: 0 10*3/uL (ref 0.0–0.1)
EOS ABS: 0 10*3/uL (ref 0.0–0.5)
EOS PCT: 1 %
HEMATOCRIT: 32.3 % — AB (ref 36.0–46.0)
Hemoglobin: 10.2 g/dL — ABNORMAL LOW (ref 12.0–15.0)
Immature Granulocytes: 0 %
LYMPHS ABS: 1 10*3/uL (ref 0.7–4.0)
Lymphocytes Relative: 20 %
MCH: 28.7 pg (ref 26.0–34.0)
MCHC: 31.6 g/dL (ref 30.0–36.0)
MCV: 90.7 fL (ref 80.0–100.0)
MONO ABS: 0.2 10*3/uL (ref 0.1–1.0)
MONOS PCT: 5 %
Neutro Abs: 4 10*3/uL (ref 1.7–7.7)
Neutrophils Relative %: 74 %
PLATELETS: 165 10*3/uL (ref 150–400)
RBC: 3.56 MIL/uL — ABNORMAL LOW (ref 3.87–5.11)
RDW: 15.8 % — AB (ref 11.5–15.5)
WBC: 5.3 10*3/uL (ref 4.0–10.5)
nRBC: 0 % (ref 0.0–0.2)

## 2018-04-16 LAB — BASIC METABOLIC PANEL
ANION GAP: 9 (ref 5–15)
BUN: 16 mg/dL (ref 8–23)
CALCIUM: 9 mg/dL (ref 8.9–10.3)
CO2: 25 mmol/L (ref 22–32)
CREATININE: 1.07 mg/dL — AB (ref 0.44–1.00)
Chloride: 107 mmol/L (ref 98–111)
GFR, EST AFRICAN AMERICAN: 59 mL/min — AB (ref >60)
GFR, EST NON AFRICAN AMERICAN: 51 mL/min — AB (ref >60)
GLUCOSE: 183 mg/dL — AB (ref 70–99)
Potassium: 3.4 mmol/L — ABNORMAL LOW (ref 3.5–5.1)
Sodium: 141 mmol/L (ref 135–145)

## 2018-04-16 LAB — TROPONIN I: Troponin I: <0.03 ng/mL (ref <0.03)

## 2018-04-16 NOTE — ED Triage Notes (Addendum)
Pt says around midnight she began having pain and numbness in her right hand and fingers; pain now radiates up to her elbow; denies injury; denies any difficulty swallowing, walking, talking; pt says she took 3 baby aspirin when pain started around midnight and has had no relief

## 2018-04-16 NOTE — ED Provider Notes (Signed)
[facility] Emergency Department Provider Note  ____________________________________________  Time seen: Approximately 4:20 AM  I have reviewed the triage vital signs and the nursing notes.   HISTORY  Chief Complaint Arm Pain    HPI Jamie Murray is a 72 y.o. female with a history of diabetes hypertension hyperlipidemia GERD, depression and anxiety who complains of waking up at about midnight tonight with paresthesia in her right hand and forearm.  Feels like a tingling burning sensation.  No trauma.  Sleeps on her side.  Denies weakness.  No involvement of the right leg.  Denies headache or vision change.  Denies having anything like this before.  Symptoms have been constant for the past 3 hours, worse with arm movement no alleviating factors.      Past Medical History:  Diagnosis Date  . Allergy   . Anemia   . Anxiety   . Depression   . Diabetes ([place])   . GERD (gastroesophageal reflux disease)   . Hyperlipidemia   . Hypertension   . Insomnia   . Migraines   . Osteoporosis   . [facility] spotted fever   . Transient cerebral ischemia   . Vertigo    every 2-3 months     Patient Active Problem List   Diagnosis Date Noted  . Pulmonary hypertension, primary ([place]) 11/29/2016  . Mobility impaired 08/26/2016  . Chest pain, rule out acute myocardial infarction 08/09/2016  . Chronic fatigue 05/11/2016  . Anemia 05/11/2016  . Depression 05/03/2016  . Hypomagnesemia 03/22/2016  . Prolonged Q-T interval on ECG 03/22/2016  . Macrocytosis 01/25/2016  . Diarrhea, functional 01/25/2016  . Hypokalemia 12/22/2015  . Heartburn   . Hiatal hernia   . Stricture and stenosis of esophagus   . Gastroesophageal reflux disease without esophagitis 07/22/2015  . Urinary tract infection 04/28/2015  . Osteoporosis 01/26/2015  . Insomnia 01/26/2015  . Allergic rhinitis 01/26/2015  . Anxiety and depression 01/26/2015  . Hypothyroidism 01/26/2015  .  Hypertension 01/26/2015  . DM type 2 (diabetes mellitus, type 2) ([place]) 01/26/2015  . Hyperlipidemia 01/26/2015  . CVA (cerebral infarction) 01/26/2015  . Chronic tension-type headache, intractable 10/06/2014  . Disordered sleep 10/06/2014     Past Surgical History:  Procedure Laterality Date  . ABDOMINAL HYSTERECTOMY    . CORONARY ANGIOPLASTY    . ESOPHAGOGASTRODUODENOSCOPY (EGD) WITH PROPOFOL N/A 08/24/2015   Procedure: ESOPHAGOGASTRODUODENOSCOPY (EGD) WITH PROPOFOL with dialation;  Surgeon: Lucilla Lame, MD;  Location: [facility];  Service: Endoscopy;  Laterality: N/A;  Diabetic - oral meds  . TUBAL LIGATION       Prior to Admission medications   Medication Sig Start Date End Date Taking? Authorizing Provider  aspirin 81 MG tablet Take 81 mg by mouth daily.    [provider]  atorvastatin (LIPITOR) 10 MG tablet Take 1 tablet (10 mg total) by mouth daily. 06/08/16   Kathrine Haddock, NP  Blood Glucose Monitoring Suppl ([place]) [place] See admin instructions. 02/23/16   [provider]  buPROPion (WELLBUTRIN SR) 150 MG 12 hr tablet Take 1 tablet (150 mg total) by mouth 2 (two) times daily. Take 1 tablet in the morning for 2 weeks then take twice and day. 11/29/16   Kathrine Haddock, NP  busPIRone (BUSPAR) 7.5 MG tablet Take 7.5 mg by mouth 2 (two) times daily. 03/22/18   [provider]  clonazePAM (KLONOPIN) 1 MG tablet Take 1 mg by mouth at bedtime. 09/29/16   [provider]  clopidogrel (PLAVIX) 75 MG tablet Take 1 tablet (75 mg total) by mouth daily. 10/04/16   Kathrine Haddock, NP  Dulaglutide (TRULICITY) 1.5 TM/2.2QJ SOPN Inject 1.5 mg into the skin once a week. 06/08/16   Kathrine Haddock, NP  EASY TOUCH TEST test strip USE TO CHECK BLOOD SUGAR ONCE DAILY 02/23/16   [provider]  escitalopram (LEXAPRO) 10 MG tablet Take 10 mg by mouth daily. 02/19/18   [provider]  Fe Fum-FA-B Cmp-C-Zn-Mg-Mn-Cu (HEMOCYTE PLUS)  106-1 MG CAPS Take 1 capsule by mouth daily. 03/23/18   [provider]  furosemide (LASIX) 20 MG tablet Take 20 mg by mouth daily as needed. 12/15/16   [provider]  glimepiride (AMARYL) 2 MG tablet Take 2 mg by mouth daily. 12/29/16   [provider]  hydrALAZINE (APRESOLINE) 25 MG tablet TAKE 4 TABLETS BY MOUTH EVERY MORNING & TAKE 4 TABLETS BY MOUTH EVERY EVENING 03/21/18   [provider]  hydrALAZINE (APRESOLINE) 50 MG tablet Take 100 mg by mouth 2 (two) times daily. 12/26/16   [provider]  ipratropium-albuterol (DUONEB) 0.5-2.5 (3) MG/3ML SOLN TAKE 1 VIAL VIA NEBULIZER AND INHALE BY MOUTH EVERY 4 HOURS AS NEEDED 03/07/18   Flora Lipps, MD  irbesartan-hydrochlorothiazide (AVALIDE) 150-12.5 MG tablet  04/15/14   [provider]  isosorbide mononitrate (IMDUR) 30 MG 24 hr tablet  04/29/14   [provider]  levothyroxine (SYNTHROID, LEVOTHROID) 50 MCG tablet TAKE ONE TABLET BY MOUTH EVERY MORNING - DOSE INCREASED 03/21/18   [provider]  LORazepam (ATIVAN) 0.5 MG tablet Take 1 tablet (0.5 mg total) by mouth daily as needed. 09/30/16   Kathrine Haddock, NP  losartan (COZAAR) 100 MG tablet Take 100 mg by mouth daily. 12/29/16   [provider]  meclizine (ANTIVERT) 25 MG tablet  04/22/14   [provider]  metFORMIN (GLUCOPHAGE) 500 MG tablet Take 500 mg by mouth 2 (two) times daily. 03/27/18   [provider]  metFORMIN (GLUCOPHAGE-XR) 500 MG 24 hr tablet Take 4 tablets (2,000 mg total) by mouth daily with breakfast. 10/27/16   Park Liter P, DO  metoprolol succinate (TOPROL-XL) 100 MG 24 hr tablet Take 100 mg by mouth daily.  04/29/16   [provider]  Multiple Vitamin (MULTIVITAMIN) tablet Take 1 tablet by mouth daily.    [provider]  pantoprazole (PROTONIX) 40 MG tablet Take 1 tablet (40 mg total) by mouth 2 (two) times daily. 09/26/16   Kathrine Haddock, NP  PROAIR HFA  108 [phone] Base) MCG/ACT inhaler Inhale 2 puffs into the lungs every 4 (four) hours as needed for wheezing or shortness of breath. 03/30/17   Flora Lipps, MD  simvastatin (ZOCOR) 40 MG tablet  03/28/14   [provider]  sitaGLIPtin (JANUVIA) 100 MG tablet  04/30/14   [provider]  spironolactone (ALDACTONE) 25 MG tablet Take 25 mg by mouth daily. 12/26/16   [provider]  traZODone (DESYREL) 100 MG tablet TAKE ONE-HALF TO ONE TABLET BY MOUTH AT NIGHT FOR SLEEP 10/06/14   [provider]  zolpidem (AMBIEN) 5 MG tablet Take by mouth.    [provider]     Allergies Prozac [fluoxetine hcl]   Family History  Problem Relation Age of Onset  . Diabetes Mother   . Hypertension Mother   . Stroke Mother   . Heart disease Mother        MI  . Cancer Father  pancreatic  . Stroke Father   . Hypertension Sister   . Diabetes Brother   . Diabetes Maternal Grandfather   . Diabetes Sister   . Diabetes Sister   . Diabetes Brother   . Diabetes Brother     Social History Social History   Tobacco Use  . Smoking status: Never Smoker  . Smokeless tobacco: Never Used  Substance Use Topics  . Alcohol use: No    Alcohol/week: 0.0 standard drinks  . Drug use: No    Review of Systems  Constitutional:   No fever or chills.  ENT:   No sore throat. No rhinorrhea. Cardiovascular:   No chest pain or syncope. Respiratory:   No dyspnea or cough. Gastrointestinal:   Negative for abdominal pain, vomiting and diarrhea.  Musculoskeletal:   Negative for focal pain or swelling All other systems reviewed and are negative except as documented above in ROS and HPI.  ____________________________________________   PHYSICAL EXAM:  VITAL SIGNS: ED Triage Vitals  Enc Vitals Group     BP 04/16/18 0250 (!) 157/68     Pulse Rate 04/16/18 0250 60     Resp 04/16/18 0250 17     Temp 04/16/18 0250 97.9 F (36.6 C)     Temp Source 04/16/18 0250 Oral      SpO2 04/16/18 0250 100 %     Weight 04/16/18 0252 165 lb (74.8 kg)     Height 04/16/18 0252 _0  (1.702 m)     Head Circumference --      Peak Flow --      Pain Score 04/16/18 0252 4     Pain Loc --      Pain Edu? --      Excl. in [place]? --     Vital signs reviewed, nursing assessments reviewed.   Constitutional:   Alert and oriented. Non-toxic appearance. Eyes:   Conjunctivae are normal. EOMI. PERRL. ENT      Head:   Normocephalic and atraumatic.      Nose:   No congestion/rhinnorhea.       Mouth/Throat:   MMM, no pharyngeal erythema. No peritonsillar mass.       Neck:   No meningismus. Full ROM. Hematological/Lymphatic/Immunilogical:   No cervical lymphadenopathy. Cardiovascular:   RRR. Symmetric bilateral radial and DP pulses.  No murmurs. Cap refill less than 2 seconds. Respiratory:   Normal respiratory effort without tachypnea/retractions. Breath sounds are clear and equal bilaterally. No wheezes/rales/rhonchi. Gastrointestinal:   Soft and nontender. Non distended. There is no CVA tenderness.  No rebound, rigidity, or guarding. Musculoskeletal:   Normal range of motion in all extremities. No joint effusions.  No lower extremity tenderness.  No edema.  No swelling or to soft tissue tenderness in the right arm.  Phalen and Tinel sign negative. Neurologic:   Normal speech and language.  Motor grossly intact. Sensation symmetric Cranial nerves III through XII intact Cerebellar function intact No acute focal neurologic deficits are appreciated.  Skin:    Skin is warm, dry and intact. No rash noted.  No petechiae, purpura, or bullae.  ____________________________________________    LABS (pertinent positives/negatives) (all labs ordered are listed, but only abnormal results are displayed) Labs Reviewed  BASIC METABOLIC PANEL - Abnormal; Notable for the following components:      Result Value   Potassium 3.4 (*)    Glucose, Bld 183 (*)    Creatinine, Ser 1.07 (*)    GFR calc  non Af Amer 51 (*)  GFR calc Af Amer 59 (*)    All other components within normal limits  CBC WITH DIFFERENTIAL/PLATELET - Abnormal; Notable for the following components:   RBC 3.56 (*)    Hemoglobin 10.2 (*)    HCT 32.3 (*)    RDW 15.8 (*)    All other components within normal limits  TROPONIN I   ____________________________________________   EKG  Interpreted by me Sinus rhythm rate of 59, normal axis and intervals.  Incomplete right bundle branch block.  Normal ST segments and T waves.  No acute ischemic changes.  ____________________________________________    RADIOLOGY  Ct Head Wo Contrast  Result Date: 04/16/2018 CLINICAL DATA:  Acute onset of right hand pain and numbness, with pain radiating up the elbow. EXAM: CT HEAD WITHOUT CONTRAST TECHNIQUE: Contiguous axial images were obtained from the base of the skull through the vertex without intravenous contrast. COMPARISON:  CT of the head and MRI of the brain performed 05/02/2016 FINDINGS: Brain: No evidence of acute infarction, hemorrhage, hydrocephalus, extra-axial collection or mass lesion/mass effect. The posterior fossa, including the cerebellum, brainstem and fourth ventricle, is within normal limits. The third and lateral ventricles, and basal ganglia are unremarkable in appearance. The cerebral hemispheres are symmetric in appearance, with normal gray-white differentiation. No mass effect or midline shift is seen. Vascular: No hyperdense vessel or unexpected calcification. Skull: There is no evidence of fracture; visualized osseous structures are unremarkable in appearance. Sinuses/Orbits: The visualized portions of the orbits are within normal limits. The paranasal sinuses and mastoid air cells are well-aerated. Other: No significant soft tissue abnormalities are seen. IMPRESSION: Unremarkable noncontrast CT of the head. Electronically Signed   By: Garald Balding M.D.   On: 04/16/2018 04:36   Mr Brain Wo Contrast  Result  Date: 04/16/2018 CLINICAL DATA:  Pain and numbness in the right hand and fingers. EXAM: MRI HEAD WITHOUT CONTRAST TECHNIQUE: Multiplanar, multiecho pulse sequences of the brain and surrounding structures were obtained without intravenous contrast. COMPARISON:  04/16/2018 head CT.  Brain MRI [id number] FINDINGS: Brain: No acute infarction, hemorrhage, hydrocephalus, extra-axial collection or mass lesion. Remote lacunar infarct in the right thalamus. Age normal brain volume. Vascular: Major flow voids are preserved Skull and upper cervical spine: Negative for marrow lesion. Degenerative facet spurring. Hyperostosis interna. Sinuses/Orbits: Negative Other: Fast brain protocol was required. IMPRESSION: No acute finding or change from 2017.  Negative for acute infarct. Electronically Signed   By: Monte Fantasia M.D.   On: 04/16/2018 06:36    ____________________________________________   PROCEDURES Procedures  ____________________________________________  DIFFERENTIAL DIAGNOSIS   Neuropraxia, pinched nerve, stroke  CLINICAL IMPRESSION / ASSESSMENT AND PLAN / ED COURSE  Pertinent labs & imaging results that were available during my care of the patient were reviewed by me and considered in my medical decision making (see chart for details).    Patient presents with isolated right distal arm paresthesia on waking up this morning.  She has multiple risk factors for stroke although clinically she has a stroke scale of 0 and symptoms are not impressive for stroke.  Given her age and risk factors, will check labs and CT of the head.  If negative I will get an MRI brain, after which if there is no discernible stroke she can follow-up with primary care for further evaluation of her symptoms.  Clinical Course as of Apr 16 704  [facility] Apr 16, 2018  0503 Labs and CT head negative.  Will proceed with MRI brain.   [PS]  Clinical Course User Index [PS] Carrie Mew, MD      ----------------------------------------- 7:04 AM on 04/16/2018 -----------------------------------------  MRI brain unremarkable.  Likely peripheral nerve dysfunction, possibly mild carpal tunnel syndrome.  Will discharge to follow-up with primary care for further evaluation.  ____________________________________________   FINAL CLINICAL IMPRESSION(S) / ED DIAGNOSES    Final diagnoses:  Paresthesia and pain of right extremity     ED Discharge Orders    None      Portions of this note were generated with dragon dictation software. Dictation errors may occur despite best attempts at proofreading.    Carrie Mew, MD 04/16/18 [phone]

## 2018-04-16 NOTE — ED Notes (Signed)
Pt c/o right arm pain and right hand tingling since midnight; denies neck pain and headache; ambulatory with slow steady gait, using cane; declined offer of wheelchair; talking in complete coherent sentences;

## 2018-04-16 NOTE — ED Notes (Signed)
Pt stated that she started having pain and numbness in her right hand that radiated up into her elbow. Denies any injury to site. Pt stated that she does not have chest pain, SHOB or any other s/s.

## 2018-04-16 NOTE — ED Notes (Signed)
Pt is back from medical imaging.

## 2018-04-16 NOTE — ED Notes (Signed)
Pt is going to MRI at this time.

## 2018-04-16 NOTE — Discharge Instructions (Addendum)
Your labs, CT scan of the head, and MRI of the brain were all unremarkable.  Please follow-up with your doctor for continued evaluation of your symptoms if they do not resolve by tomorrow.

## 2018-05-17 ENCOUNTER — Ambulatory Visit: Payer: Medicare Other | Admitting: Podiatry

## 2018-05-17 NOTE — Progress Notes (Signed)
Needs to change PCP on insurance card. 

## 2018-05-22 IMAGING — US US ABDOMEN COMPLETE
1 series · 14 of 25 positions shown · non-contrast
Comparison: Abdominal and pelvic CT scan January 21, 2016.

CLINICAL DATA: Gastroesophageal reflux symptoms for the past month,
and diarrhea.

EXAM:
ABDOMEN ULTRASOUND COMPLETE

[Series 1: us abdomen complete · 0.26mm/px · 14 of 91 slices shown]
[im 1/91]
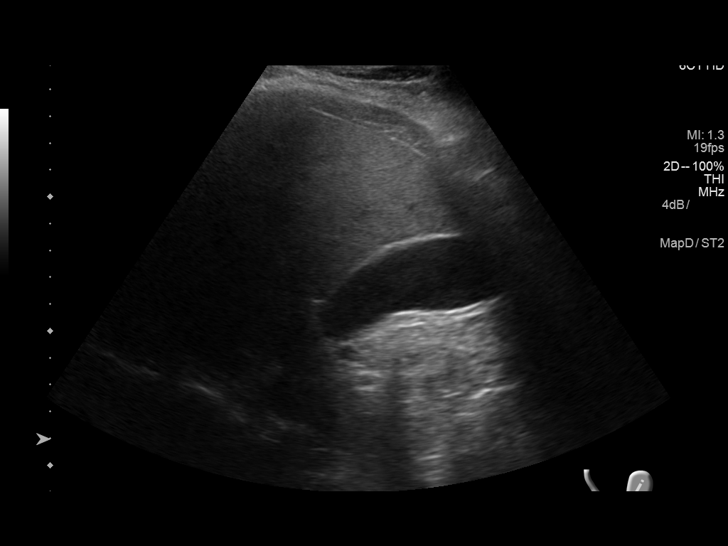
[im 8/91]
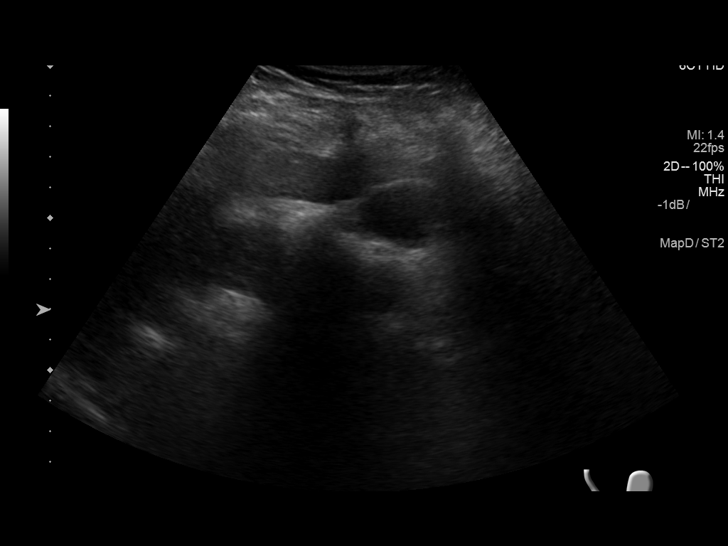
[im 16/91]
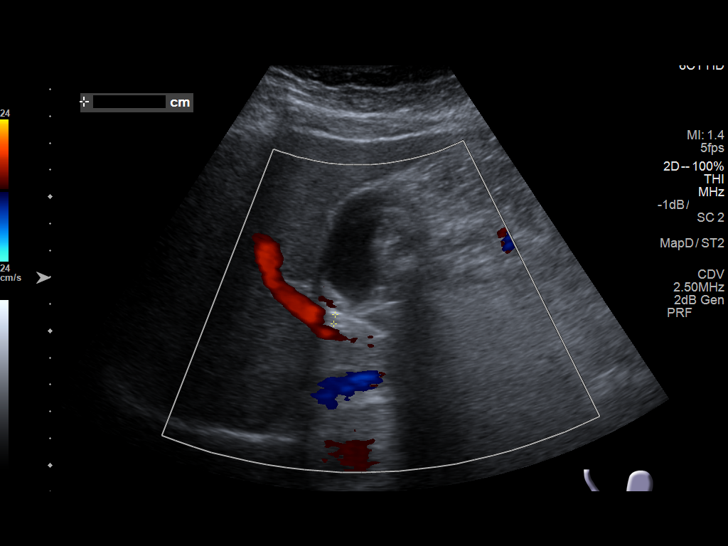
[im 23/91]
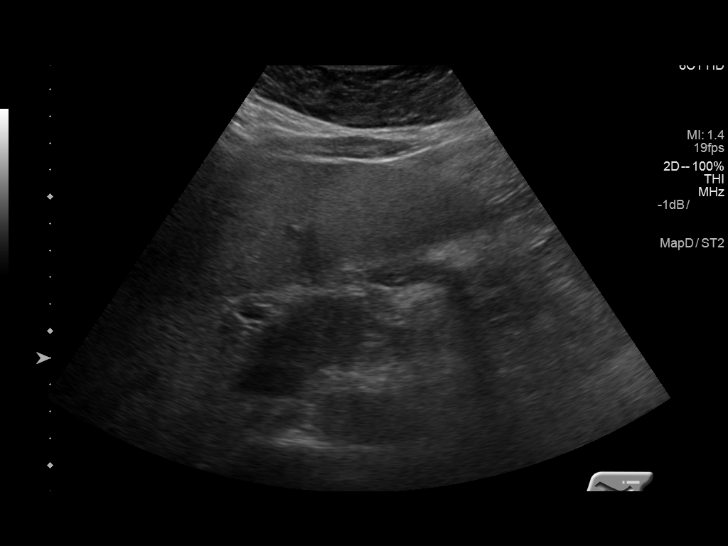
[im 31/91]
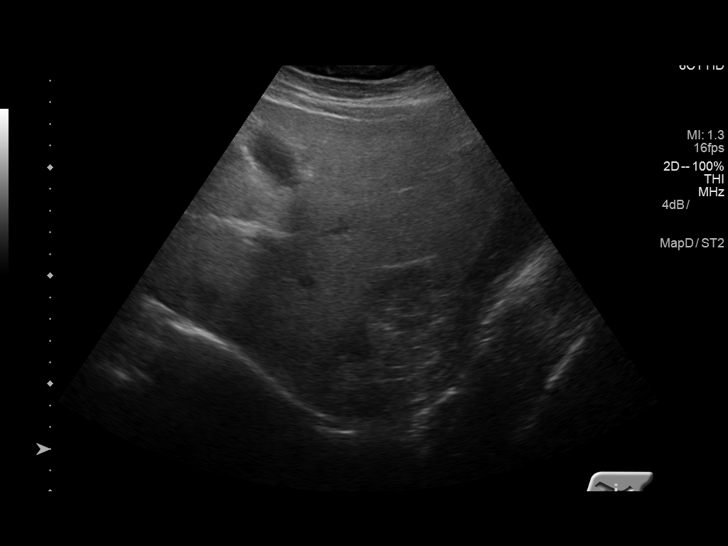
[im 34/91]
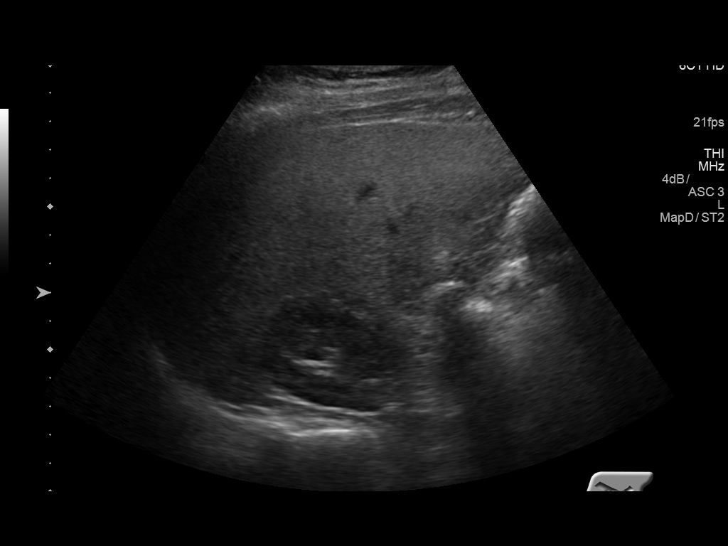
[im 42/91]
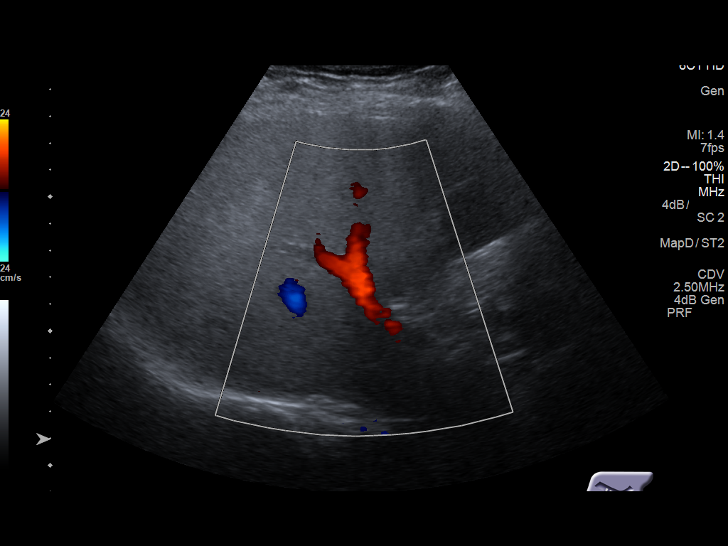
[im 49/91]
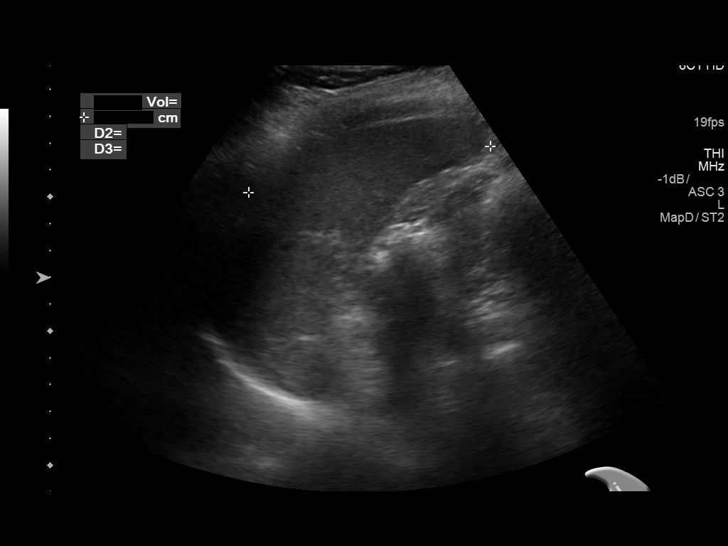
[im 57/91]
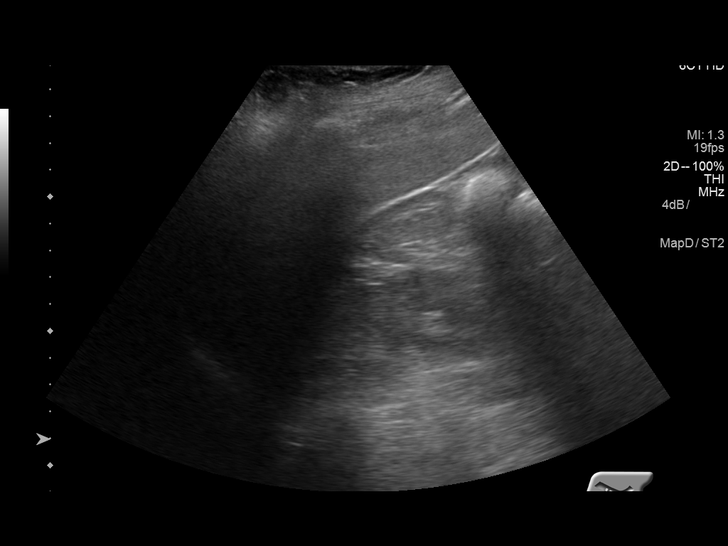
[im 61/91]
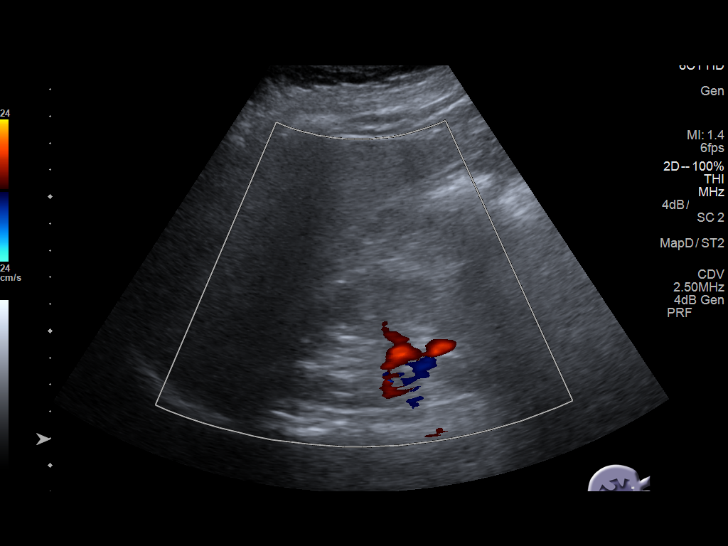
[im 68/91]
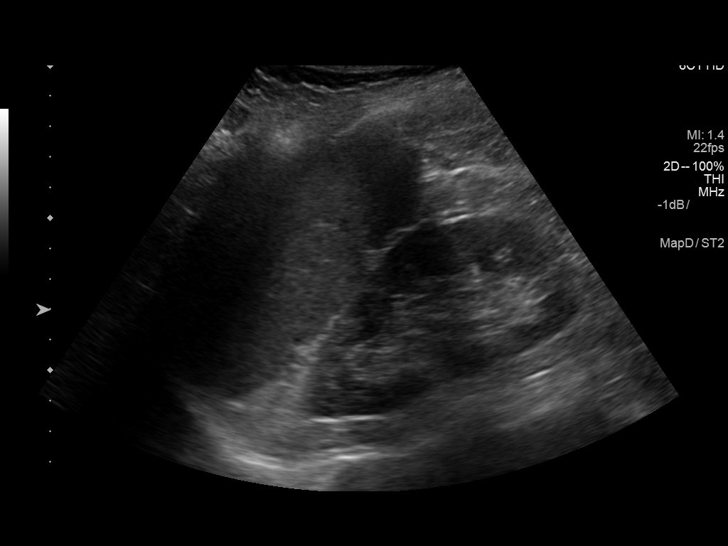
[im 76/91]
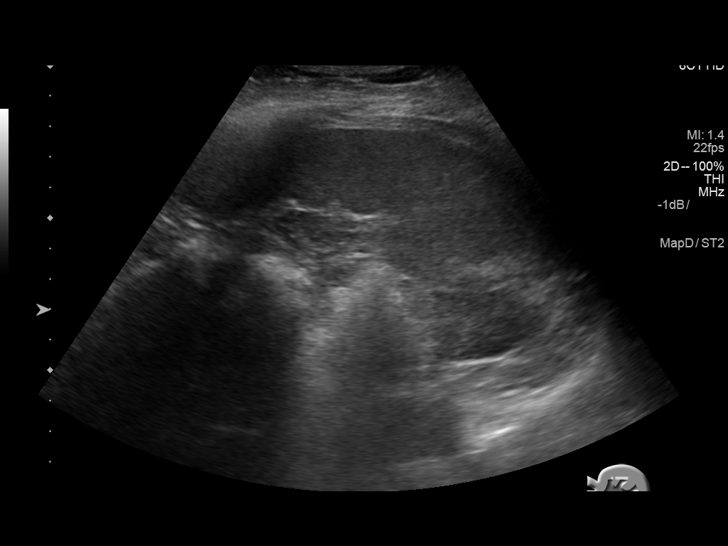
[im 83/91]
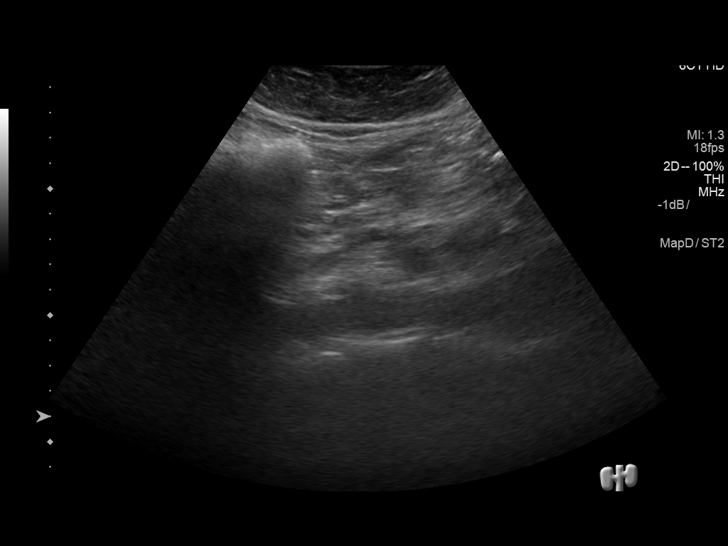
[im 91/91]
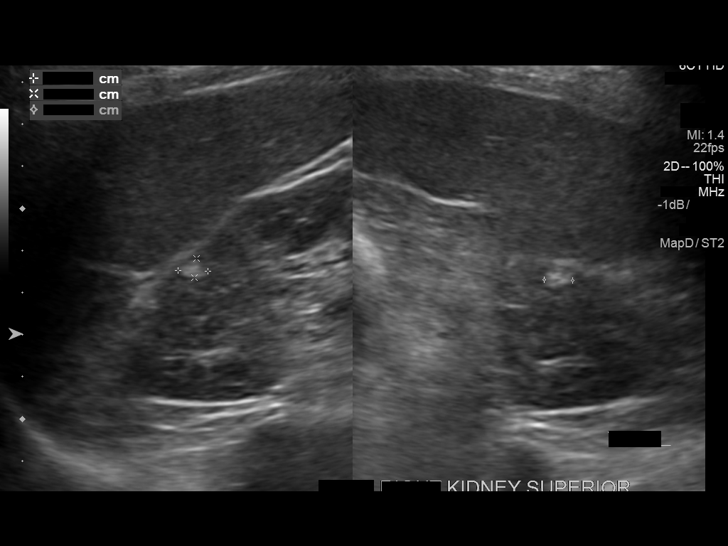

[14 of 25 positions shown; findings below may reference images not displayed]

FINDINGS: Gallbladder: No gallstones or wall thickening visualized. No
sonographic Murphy sign noted by sonographer.

Common bile duct: Diameter: 3.5 mm

Liver: The hepatic echotexture is mildly increased. There is no
focal mass or ductal dilation.

IVC: No abnormality visualized.

Pancreas: Visualized portion unremarkable.

Spleen: Size and appearance within normal limits.

Right Kidney: Length: 10.6 cm. Echogenicity within normal limits. No
mass or hydronephrosis visualized.

Left Kidney: Length: 10.8 cm. In the upper pole cortex there is an 7
x 7 x 5 mm focus which likely reflects an angiomyo lipoma. The renal
cortical echotexture is otherwise normal. There is no
hydronephrosis.

Abdominal aorta: No aneurysm visualized.

Other findings: None.
IMPRESSION: 1. No gallstones or sonographic evidence of acute cholecystitis.
Mild fatty infiltrative change of the liver.
2. Probable sub cm upper pole angiomyolipoma of the left kidney.

## 2018-07-17 ENCOUNTER — Other Ambulatory Visit: Payer: Self-pay | Admitting: Internal Medicine

## 2018-07-17 ENCOUNTER — Ambulatory Visit: Payer: Medicare Other | Admitting: Podiatry

## 2018-07-17 ENCOUNTER — Encounter: Payer: Self-pay | Admitting: Podiatry

## 2018-07-17 DIAGNOSIS — E119 Type 2 diabetes mellitus without complications: Secondary | ICD-10-CM | POA: Diagnosis not present

## 2018-07-17 DIAGNOSIS — L603 Nail dystrophy: Secondary | ICD-10-CM | POA: Diagnosis not present

## 2018-07-23 NOTE — Progress Notes (Signed)
   SUBJECTIVE Patient presents to office today complaining of elongated, thickened nails that cause pain while ambulating in shoes. She is unable to trim her own nails. She states she would like the remainder of the left great toenail removed as well. Patient is here for further evaluation and treatment.  Past Medical History:  Diagnosis Date  . Allergy   . Anemia   . Anxiety   . Depression   . Diabetes ([place])   . GERD (gastroesophageal reflux disease)   . Hyperlipidemia   . Hypertension   . Insomnia   . Migraines   . Osteoporosis   . [facility] spotted fever   . Transient cerebral ischemia   . Vertigo    every 2-3 months    OBJECTIVE General Patient is awake, alert, and oriented x 3 and in no acute distress. Derm Skin is dry and supple bilateral. Negative open lesions or macerations. Remaining integument unremarkable. Nails are tender, long, thickened and dystrophic with subungual debris, consistent with onychomycosis, 1-5 bilateral. No signs of infection noted. Vasc  DP and PT pedal pulses palpable bilaterally. Temperature gradient within normal limits.  Neuro Epicritic and protective threshold sensation grossly intact bilaterally.  Musculoskeletal Exam No symptomatic pedal deformities noted bilateral. Muscular strength within normal limits.  ASSESSMENT 1. Onychodystrophic nails 1-5 bilateral with hyperkeratosis of nails.  2. Onychomycosis of nail due to dermatophyte bilateral 3. Pain in foot bilateral  PLAN OF CARE 1. Patient evaluated today.  2. Instructed to maintain good pedal hygiene and foot care.  3. Mechanical debridement of nails 1-5 bilaterally performed using a nail nipper. Filed with dremel without incident.  4. Explained that a new nail is growing in and will take another 6 months to grow out.  5. Return to clinic as needed.    Edrick Kins, DPM [facility]  Dr. Edrick Kins, [place]                                         [place], [place] [address]                Office [phone]  Fax [phone]

## 2018-07-25 DIAGNOSIS — M79642 Pain in left hand: Secondary | ICD-10-CM | POA: Insufficient documentation

## 2018-07-25 DIAGNOSIS — M79641 Pain in right hand: Secondary | ICD-10-CM | POA: Insufficient documentation

## 2018-07-25 DIAGNOSIS — R202 Paresthesia of skin: Secondary | ICD-10-CM | POA: Insufficient documentation

## 2018-07-25 HISTORY — DX: Pain in right hand: M79.641

## 2018-08-10 ENCOUNTER — Other Ambulatory Visit: Payer: Self-pay | Admitting: Family

## 2018-08-10 DIAGNOSIS — Z1231 Encounter for screening mammogram for malignant neoplasm of breast: Secondary | ICD-10-CM

## 2018-10-16 ENCOUNTER — Ambulatory Visit: Payer: Self-pay | Admitting: Psychiatry

## 2019-03-01 ENCOUNTER — Ambulatory Visit (INDEPENDENT_AMBULATORY_CARE_PROVIDER_SITE_OTHER): Payer: Medicare Other | Admitting: Podiatry

## 2019-03-01 ENCOUNTER — Other Ambulatory Visit: Payer: Self-pay

## 2019-03-01 ENCOUNTER — Encounter: Payer: Self-pay | Admitting: Podiatry

## 2019-03-01 DIAGNOSIS — E119 Type 2 diabetes mellitus without complications: Secondary | ICD-10-CM | POA: Diagnosis not present

## 2019-03-01 DIAGNOSIS — L603 Nail dystrophy: Secondary | ICD-10-CM

## 2019-03-03 NOTE — Progress Notes (Signed)
   Subjective: 73 y.o. female presenting today with a chief complaint of possible fungus of the left great toenail that has been present for the past several months. She reports associated thickening, discoloration and tenderness of the nail. She states the nail is loosening from the nail bed. She has not had any treatment for the symptoms. There are no modifying factors noted. Patient is here for further evaluation and treatment.   Past Medical History:  Diagnosis Date  . Allergy   . Anemia   . Anxiety   . Depression   . Diabetes ([place])   . GERD (gastroesophageal reflux disease)   . Hyperlipidemia   . Hypertension   . Insomnia   . Migraines   . Osteoporosis   . [facility] spotted fever   . Transient cerebral ischemia   . Vertigo    every 2-3 months    Objective: Physical Exam General: The patient is alert and oriented x3 in no acute distress.  Dermatology: Hyperkeratotic, discolored, thickened, onychodystrophy noted to the left great toenail. Skin is warm, dry and supple bilateral lower extremities. Negative for open lesions or macerations.  Vascular: Palpable pedal pulses bilaterally. No edema or erythema noted. Capillary refill within normal limits.  Neurological: Epicritic and protective threshold grossly intact bilaterally.   Musculoskeletal Exam: Range of motion within normal limits to all pedal and ankle joints bilateral. Muscle strength 5/5 in all groups bilateral.   Assessment: #1 Dystrophic nail left great toe  Plan of Care:  #1 Patient was evaluated. #2 Recommended Tolcylen antifungal topical treatment daily.  #3 Recommended good shoe gear.  #4 Return to clinic as needed.    Edrick Kins, DPM [facility]  Dr. Edrick Kins, [place]                                        [place], [place] [address]                Office [phone]  Fax [phone]

## 2019-08-20 ENCOUNTER — Other Ambulatory Visit: Payer: Self-pay

## 2019-08-20 ENCOUNTER — Ambulatory Visit (INDEPENDENT_AMBULATORY_CARE_PROVIDER_SITE_OTHER): Payer: Medicare PPO | Admitting: Podiatry

## 2019-08-20 DIAGNOSIS — L603 Nail dystrophy: Secondary | ICD-10-CM | POA: Diagnosis not present

## 2019-08-23 NOTE — Progress Notes (Signed)
   Subjective: 74 y.o. female presenting today for follow up evaluation of a dystrophic left great toenail. She reports using the Tolcylen antifungal as directed with no significant relief. She denies modifying factors. Patient is here for further evaluation and treatment.   Past Medical History:  Diagnosis Date  . Allergy   . Anemia   . Anxiety   . Depression   . Diabetes ([place])   . GERD (gastroesophageal reflux disease)   . Hyperlipidemia   . Hypertension   . Insomnia   . Migraines   . Osteoporosis   . [facility] spotted fever   . Transient cerebral ischemia   . Vertigo    every 2-3 months    Objective:  General: Well developed, nourished, in no acute distress, alert and oriented x3   Dermatology: Hyperkeratotic, discolored, thickened, onychodystrophy of the left great toenail. Skin is warm, dry and supple bilateral lower extremities. Negative for open lesions or macerations.  Vascular: Dorsalis Pedis artery and Posterior Tibial artery pedal pulses palpable. No lower extremity edema noted.   Neruologic: Grossly intact via light touch bilateral.  Musculoskeletal: Muscular strength within normal limits in all groups bilateral. Normal range of motion noted to all pedal and ankle joints.   Assessment:  #1 Dystrophic nail of the left great toenail   Plan of Care:  1. Patient evaluated.  2. Discussed treatment alternatives and plan of care. Explained nail avulsion procedure and post procedure course to patient. 3. Patient opted for total temporary nail avulsion of the left great toenail.  4. Prior to procedure, local anesthesia infiltration utilized using 3 ml of a 50:50 mixture of 2% plain lidocaine and 0.5% plain marcaine in a normal hallux block fashion and a betadine prep performed.  5. Light dressing applied. 6. Return to clinic as needed.   Edrick Kins, DPM [facility]  Dr. Edrick Kins, [place]                                         [place], [place] [address]                Office [phone]  Fax [phone]

## 2019-10-21 ENCOUNTER — Other Ambulatory Visit: Payer: Self-pay | Admitting: Nephrology

## 2019-10-21 DIAGNOSIS — N1832 Chronic kidney disease, stage 3b: Secondary | ICD-10-CM

## 2019-10-29 ENCOUNTER — Ambulatory Visit: Payer: Medicare PPO | Admitting: Podiatry

## 2019-10-29 ENCOUNTER — Ambulatory Visit
Admission: RE | Admit: 2019-10-29 | Discharge: 2019-10-29 | Disposition: A | Discharge status: Home / Self Care | Payer: Medicare PPO | Source: Ambulatory Visit | Attending: Nephrology | Admitting: Nephrology

## 2019-10-29 ENCOUNTER — Encounter: Payer: Self-pay | Admitting: Podiatry

## 2019-10-29 ENCOUNTER — Other Ambulatory Visit: Payer: Self-pay

## 2019-10-29 DIAGNOSIS — N1832 Chronic kidney disease, stage 3b: Secondary | ICD-10-CM | POA: Insufficient documentation

## 2019-10-29 DIAGNOSIS — L603 Nail dystrophy: Secondary | ICD-10-CM

## 2019-10-29 DIAGNOSIS — E119 Type 2 diabetes mellitus without complications: Secondary | ICD-10-CM

## 2019-10-31 NOTE — Progress Notes (Signed)
   HPI: 74 y.o. female with PMHx of T2DM presenting today for follow up evaluation of a total temporary nail avulsion procedure of the left great toenail that was performed on 08/20/2019. She reports intermittent mild pain. She notes associated discoloration of the nail and wants to make sure the nail is healing appropriately. She has not had any recent treatment and denies any modifying factors. Patient is here for further evaluation and treatment.   Past Medical History:  Diagnosis Date  . Allergy   . Anemia   . Anxiety   . Depression   . Diabetes ([place])   . GERD (gastroesophageal reflux disease)   . Hyperlipidemia   . Hypertension   . Insomnia   . Migraines   . Osteoporosis   . [facility] spotted fever   . Transient cerebral ischemia   . Vertigo    every 2-3 months     Physical Exam: General: The patient is alert and oriented x3 in no acute distress.  Dermatology: absence of left nail plate noted. Skin is warm, dry and supple bilateral lower extremities. Negative for open lesions or macerations.  Vascular: Palpable pedal pulses bilaterally. No edema or erythema noted. Capillary refill within normal limits.  Neurological: Epicritic and protective threshold diminished bilaterally.   Musculoskeletal Exam: Range of motion within normal limits to all pedal and ankle joints bilateral. Muscle strength 5/5 in all groups bilateral.   Assessment: 1. H/o left great toenail avulsion 2. Type II diabetes mellitus - controlled     Plan of Care:  1. Patient evaluated.  2. Recommended good shoe gear.  3. Recommended daily foot lotion.  4. Return to clinic as needed.      Edrick Kins, DPM [facility]  Dr. Edrick Kins, DPM    [address], [place] [address]                Office [phone]  Fax [phone]

## 2020-01-09 ENCOUNTER — Inpatient Hospital Stay
Admission: EM | Admit: 2020-01-09 | Discharge: 2020-01-13 | DRG: 638 | Disposition: A | Discharge status: Home / Self Care | Payer: Medicare PPO | Attending: Internal Medicine | Admitting: Internal Medicine

## 2020-01-09 ENCOUNTER — Inpatient Hospital Stay (HOSPITAL_COMMUNITY)
Admit: 2020-01-09 | Discharge: 2020-01-09 | Disposition: A | Discharge status: Home / Self Care | Payer: Medicare PPO | Attending: Internal Medicine | Admitting: Internal Medicine

## 2020-01-09 ENCOUNTER — Other Ambulatory Visit: Payer: Self-pay

## 2020-01-09 ENCOUNTER — Emergency Department: Payer: Medicare PPO

## 2020-01-09 ENCOUNTER — Inpatient Hospital Stay: Admit: 2020-01-09 | Payer: Medicare PPO

## 2020-01-09 DIAGNOSIS — G4700 Insomnia, unspecified: Secondary | ICD-10-CM | POA: Diagnosis present

## 2020-01-09 DIAGNOSIS — E1122 Type 2 diabetes mellitus with diabetic chronic kidney disease: Secondary | ICD-10-CM | POA: Diagnosis present

## 2020-01-09 DIAGNOSIS — D631 Anemia in chronic kidney disease: Secondary | ICD-10-CM | POA: Diagnosis present

## 2020-01-09 DIAGNOSIS — I2721 Secondary pulmonary arterial hypertension: Secondary | ICD-10-CM | POA: Diagnosis not present

## 2020-01-09 DIAGNOSIS — R739 Hyperglycemia, unspecified: Secondary | ICD-10-CM

## 2020-01-09 DIAGNOSIS — E1136 Type 2 diabetes mellitus with diabetic cataract: Secondary | ICD-10-CM | POA: Diagnosis present

## 2020-01-09 DIAGNOSIS — F329 Major depressive disorder, single episode, unspecified: Secondary | ICD-10-CM | POA: Diagnosis present

## 2020-01-09 DIAGNOSIS — F419 Anxiety disorder, unspecified: Secondary | ICD-10-CM | POA: Diagnosis present

## 2020-01-09 DIAGNOSIS — E785 Hyperlipidemia, unspecified: Secondary | ICD-10-CM | POA: Diagnosis present

## 2020-01-09 DIAGNOSIS — E876 Hypokalemia: Secondary | ICD-10-CM | POA: Diagnosis not present

## 2020-01-09 DIAGNOSIS — R778 Other specified abnormalities of plasma proteins: Secondary | ICD-10-CM

## 2020-01-09 DIAGNOSIS — G4733 Obstructive sleep apnea (adult) (pediatric): Secondary | ICD-10-CM | POA: Diagnosis present

## 2020-01-09 DIAGNOSIS — I5032 Chronic diastolic (congestive) heart failure: Secondary | ICD-10-CM | POA: Diagnosis present

## 2020-01-09 DIAGNOSIS — R7989 Other specified abnormal findings of blood chemistry: Secondary | ICD-10-CM | POA: Diagnosis present

## 2020-01-09 DIAGNOSIS — K219 Gastro-esophageal reflux disease without esophagitis: Secondary | ICD-10-CM | POA: Diagnosis present

## 2020-01-09 DIAGNOSIS — N179 Acute kidney failure, unspecified: Secondary | ICD-10-CM | POA: Diagnosis present

## 2020-01-09 DIAGNOSIS — E1165 Type 2 diabetes mellitus with hyperglycemia: Secondary | ICD-10-CM | POA: Diagnosis present

## 2020-01-09 DIAGNOSIS — H43391 Other vitreous opacities, right eye: Secondary | ICD-10-CM

## 2020-01-09 DIAGNOSIS — H269 Unspecified cataract: Secondary | ICD-10-CM | POA: Diagnosis present

## 2020-01-09 DIAGNOSIS — R0602 Shortness of breath: Secondary | ICD-10-CM | POA: Diagnosis not present

## 2020-01-09 DIAGNOSIS — E11319 Type 2 diabetes mellitus with unspecified diabetic retinopathy without macular edema: Secondary | ICD-10-CM | POA: Diagnosis present

## 2020-01-09 DIAGNOSIS — Z8249 Family history of ischemic heart disease and other diseases of the circulatory system: Secondary | ICD-10-CM

## 2020-01-09 DIAGNOSIS — I10 Essential (primary) hypertension: Secondary | ICD-10-CM | POA: Diagnosis not present

## 2020-01-09 DIAGNOSIS — Z7902 Long term (current) use of antithrombotics/antiplatelets: Secondary | ICD-10-CM

## 2020-01-09 DIAGNOSIS — Z20822 Contact with and (suspected) exposure to covid-19: Secondary | ICD-10-CM | POA: Diagnosis present

## 2020-01-09 DIAGNOSIS — Z7722 Contact with and (suspected) exposure to environmental tobacco smoke (acute) (chronic): Secondary | ICD-10-CM | POA: Diagnosis present

## 2020-01-09 DIAGNOSIS — Z7984 Long term (current) use of oral hypoglycemic drugs: Secondary | ICD-10-CM

## 2020-01-09 DIAGNOSIS — I272 Pulmonary hypertension, unspecified: Secondary | ICD-10-CM | POA: Diagnosis not present

## 2020-01-09 DIAGNOSIS — Z79899 Other long term (current) drug therapy: Secondary | ICD-10-CM

## 2020-01-09 DIAGNOSIS — N189 Chronic kidney disease, unspecified: Secondary | ICD-10-CM | POA: Diagnosis not present

## 2020-01-09 DIAGNOSIS — E039 Hypothyroidism, unspecified: Secondary | ICD-10-CM | POA: Diagnosis present

## 2020-01-09 DIAGNOSIS — I5031 Acute diastolic (congestive) heart failure: Secondary | ICD-10-CM | POA: Diagnosis not present

## 2020-01-09 DIAGNOSIS — E1110 Type 2 diabetes mellitus with ketoacidosis without coma: Secondary | ICD-10-CM

## 2020-01-09 DIAGNOSIS — I130 Hypertensive heart and chronic kidney disease with heart failure and stage 1 through stage 4 chronic kidney disease, or unspecified chronic kidney disease: Secondary | ICD-10-CM | POA: Diagnosis present

## 2020-01-09 DIAGNOSIS — Z823 Family history of stroke: Secondary | ICD-10-CM

## 2020-01-09 DIAGNOSIS — E1169 Type 2 diabetes mellitus with other specified complication: Secondary | ICD-10-CM

## 2020-01-09 DIAGNOSIS — J449 Chronic obstructive pulmonary disease, unspecified: Secondary | ICD-10-CM | POA: Diagnosis present

## 2020-01-09 DIAGNOSIS — N1832 Chronic kidney disease, stage 3b: Secondary | ICD-10-CM

## 2020-01-09 DIAGNOSIS — Z9861 Coronary angioplasty status: Secondary | ICD-10-CM

## 2020-01-09 DIAGNOSIS — I270 Primary pulmonary hypertension: Secondary | ICD-10-CM | POA: Diagnosis present

## 2020-01-09 DIAGNOSIS — M810 Age-related osteoporosis without current pathological fracture: Secondary | ICD-10-CM | POA: Diagnosis present

## 2020-01-09 DIAGNOSIS — Z7982 Long term (current) use of aspirin: Secondary | ICD-10-CM

## 2020-01-09 DIAGNOSIS — Z79890 Hormone replacement therapy (postmenopausal): Secondary | ICD-10-CM

## 2020-01-09 DIAGNOSIS — Z833 Family history of diabetes mellitus: Secondary | ICD-10-CM

## 2020-01-09 DIAGNOSIS — Z8673 Personal history of transient ischemic attack (TIA), and cerebral infarction without residual deficits: Secondary | ICD-10-CM

## 2020-01-09 HISTORY — DX: Other specified abnormal findings of blood chemistry: R79.89

## 2020-01-09 HISTORY — DX: Chronic obstructive pulmonary disease, unspecified: J44.9

## 2020-01-09 LAB — CBC WITH DIFFERENTIAL/PLATELET
Abs Immature Granulocytes: 0.01 10*3/uL (ref 0.00–0.07)
Basophils Absolute: 0 10*3/uL (ref 0.0–0.1)
Basophils Relative: 0 %
Eosinophils Absolute: 0 10*3/uL (ref 0.0–0.5)
Eosinophils Relative: 1 %
HCT: 30.5 % — ABNORMAL LOW (ref 36.0–46.0)
Hemoglobin: 10.2 g/dL — ABNORMAL LOW (ref 12.0–15.0)
Immature Granulocytes: 0 %
Lymphocytes Relative: 29 %
Lymphs Abs: 1.2 10*3/uL (ref 0.7–4.0)
MCH: 30.2 pg (ref 26.0–34.0)
MCHC: 33.4 g/dL (ref 30.0–36.0)
MCV: 90.2 fL (ref 80.0–100.0)
Monocytes Absolute: 0.2 10*3/uL (ref 0.1–1.0)
Monocytes Relative: 6 %
Neutro Abs: 2.5 10*3/uL (ref 1.7–7.7)
Neutrophils Relative %: 64 %
Platelets: 112 10*3/uL — ABNORMAL LOW (ref 150–400)
RBC: 3.38 MIL/uL — ABNORMAL LOW (ref 3.87–5.11)
RDW: 14.6 % (ref 11.5–15.5)
WBC: 3.9 10*3/uL — ABNORMAL LOW (ref 4.0–10.5)
nRBC: 0 % (ref 0.0–0.2)

## 2020-01-09 LAB — ECHOCARDIOGRAM COMPLETE
AR max vel: 1.64 cm2
AV Area VTI: 1.67 cm2
AV Area mean vel: 1.98 cm2
AV Mean grad: 3 mmHg
AV Peak grad: 7.3 mmHg
Ao pk vel: 1.35 m/s
Area-P 1/2: 2.8 cm2
Height: 67 in
S' Lateral: 2.08 cm
Weight: 2640 oz

## 2020-01-09 LAB — COMPREHENSIVE METABOLIC PANEL
ALT: 26 U/L (ref 0–44)
AST: 36 U/L (ref 15–41)
Albumin: 3.7 g/dL (ref 3.5–5.0)
Alkaline Phosphatase: 71 U/L (ref 38–126)
Anion gap: 13 (ref 5–15)
BUN: 36 mg/dL — ABNORMAL HIGH (ref 8–23)
CO2: 18 mmol/L — ABNORMAL LOW (ref 22–32)
Calcium: 8.8 mg/dL — ABNORMAL LOW (ref 8.9–10.3)
Chloride: 101 mmol/L (ref 98–111)
Creatinine, Ser: 2.03 mg/dL — ABNORMAL HIGH (ref 0.44–1.00)
GFR calc Af Amer: 27 mL/min — ABNORMAL LOW (ref >60)
GFR calc non Af Amer: 24 mL/min — ABNORMAL LOW (ref >60)
Glucose, Bld: 792 mg/dL (ref 70–99)
Potassium: 4.3 mmol/L (ref 3.5–5.1)
Sodium: 132 mmol/L — ABNORMAL LOW (ref 135–145)
Total Bilirubin: 1.2 mg/dL (ref 0.3–1.2)
Total Protein: 6.8 g/dL (ref 6.5–8.1)

## 2020-01-09 LAB — BASIC METABOLIC PANEL
Anion gap: 9 (ref 5–15)
BUN: 29 mg/dL — ABNORMAL HIGH (ref 8–23)
CO2: 20 mmol/L — ABNORMAL LOW (ref 22–32)
Calcium: 9.2 mg/dL (ref 8.9–10.3)
Chloride: 108 mmol/L (ref 98–111)
Creatinine, Ser: 1.8 mg/dL — ABNORMAL HIGH (ref 0.44–1.00)
GFR calc Af Amer: 32 mL/min — ABNORMAL LOW (ref >60)
GFR calc non Af Amer: 27 mL/min — ABNORMAL LOW (ref >60)
Glucose, Bld: 240 mg/dL — ABNORMAL HIGH (ref 70–99)
Potassium: 3.8 mmol/L (ref 3.5–5.1)
Sodium: 137 mmol/L (ref 135–145)

## 2020-01-09 LAB — TROPONIN I (HIGH SENSITIVITY)
Troponin I (High Sensitivity): 84 ng/L — ABNORMAL HIGH (ref <18)
Troponin I (High Sensitivity): 93 ng/L — ABNORMAL HIGH (ref <18)
Troponin I (High Sensitivity): 90 ng/L — ABNORMAL HIGH (ref <18)
Troponin I (High Sensitivity): 77 ng/L — ABNORMAL HIGH (ref <18)

## 2020-01-09 LAB — LIPID PANEL
Cholesterol: 128 mg/dL (ref 0–200)
HDL: 24 mg/dL — ABNORMAL LOW (ref >40)
LDL Cholesterol: 68 mg/dL (ref 0–99)
Total CHOL/HDL Ratio: 5.3 RATIO
Triglycerides: 178 mg/dL — ABNORMAL HIGH (ref <150)
VLDL: 36 mg/dL (ref 0–40)

## 2020-01-09 LAB — CBC
HCT: 31.4 % — ABNORMAL LOW (ref 36.0–46.0)
Hemoglobin: 10.1 g/dL — ABNORMAL LOW (ref 12.0–15.0)
MCH: 29.6 pg (ref 26.0–34.0)
MCHC: 32.2 g/dL (ref 30.0–36.0)
MCV: 92.1 fL (ref 80.0–100.0)
Platelets: 110 10*3/uL — ABNORMAL LOW (ref 150–400)
RBC: 3.41 MIL/uL — ABNORMAL LOW (ref 3.87–5.11)
RDW: 14.6 % (ref 11.5–15.5)
WBC: 3.9 10*3/uL — ABNORMAL LOW (ref 4.0–10.5)
nRBC: 0 % (ref 0.0–0.2)

## 2020-01-09 LAB — GLUCOSE, CAPILLARY
Glucose-Capillary: >600 mg/dL (ref 70–99)
Glucose-Capillary: 552 mg/dL (ref 70–99)
Glucose-Capillary: 427 mg/dL — ABNORMAL HIGH (ref 70–99)
Glucose-Capillary: 273 mg/dL — ABNORMAL HIGH (ref 70–99)
Glucose-Capillary: 206 mg/dL — ABNORMAL HIGH (ref 70–99)
Glucose-Capillary: 254 mg/dL — ABNORMAL HIGH (ref 70–99)

## 2020-01-09 LAB — MAGNESIUM: Magnesium: 2.5 mg/dL — ABNORMAL HIGH (ref 1.7–2.4)

## 2020-01-09 LAB — BRAIN NATRIURETIC PEPTIDE: B Natriuretic Peptide: 2387.1 pg/mL — ABNORMAL HIGH (ref 0.0–100.0)

## 2020-01-09 LAB — TSH: TSH: 1.836 u[IU]/mL (ref 0.350–4.500)

## 2020-01-09 LAB — SARS CORONAVIRUS 2 BY RT PCR (HOSPITAL ORDER, PERFORMED IN ~~LOC~~ HOSPITAL LAB): SARS Coronavirus 2: NEGATIVE

## 2020-01-09 LAB — BETA-HYDROXYBUTYRIC ACID: Beta-Hydroxybutyric Acid: 2.21 mmol/L — ABNORMAL HIGH (ref 0.05–0.27)

## 2020-01-09 MED ORDER — ATORVASTATIN CALCIUM 10 MG PO TABS
10.0000 mg | ORAL_TABLET | Freq: Every day | ORAL | Status: DC
  Administered 2020-01-09 – 2020-01-12 (×4): 10 mg via ORAL
  Filled 2020-01-09 (×4): qty 1

## 2020-01-09 MED ORDER — INSULIN REGULAR(HUMAN) IN NACL 100-0.9 UT/100ML-% IV SOLN
INTRAVENOUS | Status: DC
  Administered 2020-01-09: 9.5 [IU]/h via INTRAVENOUS
  Filled 2020-01-09: qty 100

## 2020-01-09 MED ORDER — INSULIN ASPART 100 UNIT/ML ~~LOC~~ SOLN: 8.0000 [IU] | Freq: Once | SUBCUTANEOUS | Status: DC

## 2020-01-09 MED ORDER — ASPIRIN EC 81 MG PO TBEC
81.0000 mg | DELAYED_RELEASE_TABLET | Freq: Every day | ORAL | Status: DC
  Administered 2020-01-09 – 2020-01-13 (×5): 81 mg via ORAL
  Filled 2020-01-09 (×5): qty 1

## 2020-01-09 MED ORDER — ACETAMINOPHEN 650 MG RE SUPP: 650.0000 mg | Freq: Four times a day (QID) | RECTAL | Status: DC | PRN

## 2020-01-09 MED ORDER — ISOSORBIDE MONONITRATE ER 30 MG PO TB24
30.0000 mg | ORAL_TABLET | Freq: Every day | ORAL | Status: DC
  Administered 2020-01-09 – 2020-01-13 (×5): 30 mg via ORAL
  Filled 2020-01-09 (×5): qty 1

## 2020-01-09 MED ORDER — POTASSIUM CHLORIDE 10 MEQ/100ML IV SOLN
10.0000 meq | Freq: Once | INTRAVENOUS | Status: AC
  Administered 2020-01-09: 10 meq via INTRAVENOUS
  Filled 2020-01-09: qty 100

## 2020-01-09 MED ORDER — LEVOTHYROXINE SODIUM 50 MCG PO TABS
50.0000 ug | ORAL_TABLET | Freq: Every day | ORAL | Status: DC
  Administered 2020-01-10: 50 ug via ORAL
  Filled 2020-01-09: qty 1

## 2020-01-09 MED ORDER — ZALEPLON 10 MG PO CAPS
10.0000 mg | ORAL_CAPSULE | Freq: Every day | ORAL | Status: DC
  Administered 2020-01-10 – 2020-01-12 (×4): 10 mg via ORAL
  Filled 2020-01-09 (×3): qty 1

## 2020-01-09 MED ORDER — BUSPIRONE HCL 15 MG PO TABS
7.5000 mg | ORAL_TABLET | Freq: Two times a day (BID) | ORAL | Status: DC
  Administered 2020-01-09 – 2020-01-10 (×3): 7.5 mg via ORAL
  Filled 2020-01-09: qty 1
  Filled 2020-01-09: qty 2
  Filled 2020-01-09 (×2): qty 1

## 2020-01-09 MED ORDER — FE FUMARATE-B12-VIT C-FA-IFC PO CAPS
1.0000 | ORAL_CAPSULE | Freq: Every day | ORAL | Status: DC
  Administered 2020-01-09 – 2020-01-13 (×5): 1 via ORAL
  Filled 2020-01-09 (×6): qty 1

## 2020-01-09 MED ORDER — ENOXAPARIN SODIUM 30 MG/0.3ML ~~LOC~~ SOLN
30.0000 mg | SUBCUTANEOUS | Status: DC
  Administered 2020-01-09: 30 mg via SUBCUTANEOUS
  Filled 2020-01-09: qty 0.3

## 2020-01-09 MED ORDER — SODIUM CHLORIDE 0.9 % IV SOLN: INTRAVENOUS | Status: DC

## 2020-01-09 MED ORDER — DEXTROSE-NACL 5-0.45 % IV SOLN: INTRAVENOUS | Status: DC

## 2020-01-09 MED ORDER — CLOPIDOGREL BISULFATE 75 MG PO TABS
75.0000 mg | ORAL_TABLET | Freq: Every day | ORAL | Status: DC
  Administered 2020-01-09 – 2020-01-10 (×2): 75 mg via ORAL
  Filled 2020-01-09 (×2): qty 1

## 2020-01-09 MED ORDER — ACETAMINOPHEN 325 MG PO TABS
650.0000 mg | ORAL_TABLET | Freq: Four times a day (QID) | ORAL | Status: DC | PRN
  Administered 2020-01-11 – 2020-01-12 (×2): 650 mg via ORAL
  Filled 2020-01-09 (×2): qty 2

## 2020-01-09 MED ORDER — SODIUM CHLORIDE 0.9 % IV BOLUS
1000.0000 mL | Freq: Once | INTRAVENOUS | Status: AC
  Administered 2020-01-09: 1000 mL via INTRAVENOUS

## 2020-01-09 MED ORDER — LORAZEPAM 0.5 MG PO TABS: 0.5000 mg | ORAL_TABLET | Freq: Every day | ORAL | Status: DC | PRN

## 2020-01-09 MED ORDER — BUPROPION HCL ER (SR) 150 MG PO TB12
150.0000 mg | ORAL_TABLET | Freq: Two times a day (BID) | ORAL | Status: DC
  Administered 2020-01-09 – 2020-01-10 (×3): 150 mg via ORAL
  Filled 2020-01-09 (×4): qty 1

## 2020-01-09 MED ORDER — DEXTROSE 50 % IV SOLN: 0.0000 mL | INTRAVENOUS | Status: DC | PRN

## 2020-01-09 MED ORDER — ADULT MULTIVITAMIN W/MINERALS CH
1.0000 | ORAL_TABLET | Freq: Every day | ORAL | Status: DC
  Administered 2020-01-09 – 2020-01-13 (×5): 1 via ORAL
  Filled 2020-01-09 (×5): qty 1

## 2020-01-09 MED ORDER — POTASSIUM CHLORIDE 10 MEQ/100ML IV SOLN
10.0000 meq | INTRAVENOUS | Status: AC
  Administered 2020-01-09: 10 meq via INTRAVENOUS
  Filled 2020-01-09: qty 100

## 2020-01-09 MED ORDER — INSULIN ASPART 100 UNIT/ML ~~LOC~~ SOLN
0.0000 [IU] | Freq: Three times a day (TID) | SUBCUTANEOUS | Status: DC
  Administered 2020-01-10: 8 [IU] via SUBCUTANEOUS
  Filled 2020-01-09: qty 1

## 2020-01-09 MED ORDER — INSULIN DETEMIR 100 UNIT/ML ~~LOC~~ SOLN
10.0000 [IU] | Freq: Every day | SUBCUTANEOUS | Status: DC
  Administered 2020-01-09: 10 [IU] via SUBCUTANEOUS
  Filled 2020-01-09 (×2): qty 0.1

## 2020-01-09 NOTE — ED Notes (Signed)
Date and time results received: 01/09/20 8:06 AM  (use smartphrase ".now" to insert current time)  Test: Glucose Critical Value: 792  Name of Provider Notified: Dr. Ellender Hose  Orders Received? Or Actions Taken?: No new orders at this time

## 2020-01-09 NOTE — H&P (Signed)
History and Physical    Jamie Murray YTK:[id number] DOB: 02/10/1946 DOA: 01/09/2020  PCP: Perrin Maltese, MD   Patient coming from: Home  I have personally briefly reviewed patient's old medical records in [place]  Chief Complaint: Shortness of breath  HPI: Jamie Murray is a 74 y.o. female with medical history significant for diabetes mellitus, COPD, depression, GERD, dyslipidemia and TIA who presents to the emergency room for evaluation of shortness of breath for several days as well as hyperglycemia.  Patient had initially called EMS because of shortness of breath which she said initially occurred with exertion but now she is short of breath even at rest. She denies having any chest pain or abdominal pain but complains of feeling very weak and tired.  She also complains of blurry vision but denies having any headache or any visual loss.  Patient stated that her primary care provider had recently discontinued her Metformin and Trulicity and switched her to [place] and that since then she has had urinary frequency and increased thirst.  She denies having any dysuria or fever. Per EMS her blood sugar read "high" on her glucometer. She denies having any nausea, no vomiting, no diaphoresis, no palpitations, no dizziness or lightheadedness. Her labs reveal glucose of 793, serum bicarbonate of 18, anion gap of 13, BNP of 2387 and elevated troponin levels.  Patient also has a serum creatinine of 2.03 when compared to her baseline of 1.07. Chest x-ray reviewed by me shows cardiomegaly without effusions. 12-lead EKG reviewed by me shows sinus rhythm with a right bundle branch block     ED Course: Patient is a 74 year old African-American female who presents to the emergency room via EMS for evaluation of shortness of breath and was noted to have hypoglycemia.  She has a serum bicarbonate level of 18 and serum glucose level of greater than 700.  She also has worsening of her  renal function from baseline as well as elevated troponin levels.  Patient will be admitted to the hospital for further evaluation.  Review of Systems: As per HPI otherwise 10 point review of systems negative.    Past Medical History:  Diagnosis Date  . Allergy   . Anemia   . Anxiety   . COPD (chronic obstructive pulmonary disease) ([place])   . Depression   . Diabetes ([place])   . GERD (gastroesophageal reflux disease)   . Hyperlipidemia   . Hypertension   . Insomnia   . Migraines   . Osteoporosis   . [facility] spotted fever   . Transient cerebral ischemia   . Vertigo    every 2-3 months    Past Surgical History:  Procedure Laterality Date  . ABDOMINAL HYSTERECTOMY    . CORONARY ANGIOPLASTY    . ESOPHAGOGASTRODUODENOSCOPY (EGD) WITH PROPOFOL N/A 08/24/2015   Procedure: ESOPHAGOGASTRODUODENOSCOPY (EGD) WITH PROPOFOL with dialation;  Surgeon: Lucilla Lame, MD;  Location: [facility];  Service: Endoscopy;  Laterality: N/A;  Diabetic - oral meds  . TUBAL LIGATION       reports that she has never smoked. She has never used smokeless tobacco. She reports that she does not drink alcohol and does not use drugs.  Allergies  Allergen Reactions  . Prozac [Fluoxetine Hcl] Other (See Comments)    "felt crazy"    Family History  Problem Relation Age of Onset  . Diabetes Mother   . Hypertension Mother   . Stroke Mother   . Heart disease Mother  MI  . Cancer Father        pancreatic  . Stroke Father   . Hypertension Sister   . Diabetes Brother   . Diabetes Maternal Grandfather   . Diabetes Sister   . Diabetes Sister   . Diabetes Brother   . Diabetes Brother      Prior to Admission medications   Medication Sig Start Date End Date Taking? Authorizing Provider  aspirin 81 MG tablet Take 81 mg by mouth daily.    [provider]  atorvastatin (LIPITOR) 10 MG tablet Take 1 tablet (10 mg total) by mouth daily. 06/08/16   Kathrine Haddock, NP  Blood Glucose  Monitoring Suppl ([place]) [place] See admin instructions. 02/23/16   [provider]  buPROPion (WELLBUTRIN SR) 150 MG 12 hr tablet Take 1 tablet (150 mg total) by mouth 2 (two) times daily. Take 1 tablet in the morning for 2 weeks then take twice and day. 11/29/16   Kathrine Haddock, NP  busPIRone (BUSPAR) 7.5 MG tablet Take 7.5 mg by mouth 2 (two) times daily. 03/22/18   [provider]  clonazePAM (KLONOPIN) 1 MG tablet Take 1 mg by mouth at bedtime. 09/29/16   [provider]  clopidogrel (PLAVIX) 75 MG tablet Take 1 tablet (75 mg total) by mouth daily. 10/04/16   Kathrine Haddock, NP  Dulaglutide (TRULICITY) 1.5 ZM/6.2HU SOPN Inject 1.5 mg into the skin once a week. 06/08/16   Kathrine Haddock, NP  EASY TOUCH TEST test strip USE TO CHECK BLOOD SUGAR ONCE DAILY 02/23/16   [provider]  escitalopram (LEXAPRO) 10 MG tablet Take 10 mg by mouth daily. 02/19/18   [provider]  Fe Fum-FA-B Cmp-C-Zn-Mg-Mn-Cu (HEMOCYTE PLUS) 106-1 MG CAPS Take 1 capsule by mouth daily. 03/23/18   [provider]  furosemide (LASIX) 20 MG tablet Take 20 mg by mouth daily as needed. 12/15/16   [provider]  glimepiride (AMARYL) 2 MG tablet Take 2 mg by mouth daily. 12/29/16   [provider]  hydrALAZINE (APRESOLINE) 25 MG tablet TAKE 4 TABLETS BY MOUTH EVERY MORNING & TAKE 4 TABLETS BY MOUTH EVERY EVENING 03/21/18   [provider]  hydrALAZINE (APRESOLINE) 50 MG tablet Take 100 mg by mouth 2 (two) times daily. 12/26/16   [provider]  ipratropium-albuterol (DUONEB) 0.5-2.5 (3) MG/3ML SOLN TAKE 1 VIAL VIA NEBULIZER AND INHALE BY MOUTH EVERY 4 HOURS AS NEEDED 03/07/18   Flora Lipps, MD  irbesartan-hydrochlorothiazide (AVALIDE) 150-12.5 MG tablet  04/15/14   [provider]  isosorbide mononitrate (IMDUR) 30 MG 24 hr tablet  04/29/14   [provider]  levothyroxine (SYNTHROID, LEVOTHROID) 50 MCG tablet  TAKE ONE TABLET BY MOUTH EVERY MORNING - DOSE INCREASED 03/21/18   [provider]  LORazepam (ATIVAN) 0.5 MG tablet Take 1 tablet (0.5 mg total) by mouth daily as needed. 09/30/16   Kathrine Haddock, NP  losartan (COZAAR) 100 MG tablet Take 100 mg by mouth daily. 12/29/16   [provider]  meclizine (ANTIVERT) 25 MG tablet  04/22/14   [provider]  metFORMIN (GLUCOPHAGE) 500 MG tablet Take 500 mg by mouth 2 (two) times daily. 03/27/18   [provider]  metFORMIN (GLUCOPHAGE-XR) 500 MG 24 hr tablet Take 4 tablets (2,000 mg total) by mouth daily with breakfast. 10/27/16   Park Liter P, DO  metoprolol succinate (TOPROL-XL) 100 MG 24 hr tablet Take 100 mg by mouth daily.  04/29/16   [provider]  Multiple Vitamin (MULTIVITAMIN) tablet Take 1 tablet by mouth daily.    [provider]  pantoprazole (PROTONIX) 40 MG tablet Take 1 tablet (40 mg total) by mouth 2 (two) times daily. 09/26/16   Kathrine Haddock, NP  PROAIR HFA 108 (90 Base) MCG/ACT inhaler INHALE 2 PUFFS BY MOUTH INTO THE LUNGS EVERY 4 HOURS AS NEEDED FOR WHEEZING OR SHORTNESS OF BREATH 07/17/18   Flora Lipps, MD  simvastatin (ZOCOR) 40 MG tablet  03/28/14   [provider]  sitaGLIPtin (JANUVIA) 100 MG tablet  04/30/14   [provider]  spironolactone (ALDACTONE) 25 MG tablet Take 25 mg by mouth daily. 12/26/16   [provider]  traZODone (DESYREL) 100 MG tablet TAKE ONE-HALF TO ONE TABLET BY MOUTH AT NIGHT FOR SLEEP 10/06/14   [provider]  zolpidem (AMBIEN) 5 MG tablet Take by mouth.    [provider]    Physical Exam: Vitals:   01/09/20 0555 01/09/20 0632 01/09/20 0730 01/09/20 0738  BP:  (!) 156/83 (!) 144/86   Pulse:  (!) 56 (!) 50 61  Resp:  (!) 21    Temp:      TempSrc:      SpO2:  98% 99% 100%  Weight: 74.8 kg     Height: _0  (1.702 m)        Vitals:   01/09/20 0555 01/09/20 0632 01/09/20 0730 01/09/20 0738  BP:   (!) 156/83 (!) 144/86   Pulse:  (!) 56 (!) 50 61  Resp:  (!) 21    Temp:      TempSrc:      SpO2:  98% 99% 100%  Weight: 74.8 kg     Height: _1  (1.702 m)       Constitutional: NAD, alert and oriented x 3.  Appears comfortable and in no distress Eyes: PERRL, lids and conjunctivae normal ENMT: Mucous membranes are moist.  Neck: normal, supple, no masses, no thyromegaly Respiratory: clear to auscultation bilaterally, no wheezing, no crackles. Normal respiratory effort. No accessory muscle use.  Cardiovascular: Bradycardic, no murmurs / rubs / gallops. No extremity edema. 2+ pedal pulses. No carotid bruits.  Abdomen: no tenderness, no masses palpated. No hepatosplenomegaly. Bowel sounds positive.  Musculoskeletal: no clubbing / cyanosis. No joint deformity upper and lower extremities.  Skin: no rashes, lesions, ulcers.  Neurologic: No gross focal neurologic deficit. Psychiatric: Normal mood and affect.   Labs on Admission: I have personally reviewed following labs and imaging studies  CBC: Recent Labs  Lab 01/09/20 0554  WBC 3.9*  NEUTROABS 2.5  HGB 10.2*  HCT 30.5*  MCV 90.2  PLT 081*   Basic Metabolic Panel: Recent Labs  Lab 01/09/20 0554  NA 132*  K 4.3  CL 101  CO2 18*  GLUCOSE 792*  BUN 36*  CREATININE 2.03*  CALCIUM 8.8*  MG 2.5*   GFR: Estimated Creatinine Clearance: 26.1 mL/min (A) (by C-G formula based on SCr of 2.03 mg/dL (H)). Liver Function Tests: Recent Labs  Lab 01/09/20 0554  AST 36  ALT 26  ALKPHOS 71  BILITOT 1.2  PROT 6.8  ALBUMIN 3.7   No results for input(s): LIPASE, AMYLASE in the last 168 hours. No results for input(s): AMMONIA in the last 168 hours. Coagulation Profile: No results for input(s): INR, PROTIME in the last 168 hours. Cardiac Enzymes: No results for input(s): CKTOTAL, CKMB, CKMBINDEX, TROPONINI in the last 168 hours. BNP (last 3 results) No results for input(s):  PROBNP in the last 8760 hours. HbA1C: No results  for input(s): HGBA1C in the last 72 hours. CBG: No results for input(s): GLUCAP in the last 168 hours. Lipid Profile: No results for input(s): CHOL, HDL, LDLCALC, TRIG, CHOLHDL, LDLDIRECT in the last 72 hours. Thyroid Function Tests: No results for input(s): TSH, T4TOTAL, FREET4, T3FREE, THYROIDAB in the last 72 hours. Anemia Panel: No results for input(s): VITAMINB12, FOLATE, FERRITIN, TIBC, IRON, RETICCTPCT in the last 72 hours. Urine analysis:    Component Value Date/Time   COLORURINE STRAW (A) 05/02/2016 0021   APPEARANCEUR CLEAR (A) 05/02/2016 0021   APPEARANCEUR Cloudy (A) 04/28/2015 1100   LABSPEC 1.010 05/02/2016 0021   LABSPEC 1.024 04/17/2012 1056   PHURINE 6.0 05/02/2016 0021   GLUCOSEU 50 (A) 05/02/2016 0021   GLUCOSEU Negative 04/17/2012 1056   HGBUR NEGATIVE 05/02/2016 0021   BILIRUBINUR NEGATIVE 05/02/2016 0021   BILIRUBINUR Negative 04/28/2015 1100   BILIRUBINUR Negative 04/17/2012 1056   KETONESUR NEGATIVE 05/02/2016 0021   PROTEINUR NEGATIVE 05/02/2016 0021   NITRITE NEGATIVE 05/02/2016 0021   LEUKOCYTESUR TRACE (A) 05/02/2016 0021   LEUKOCYTESUR 2+ (A) 04/28/2015 1100   LEUKOCYTESUR 1+ 04/17/2012 1056    Radiological Exams on Admission: DG Chest Portable 1 View  Result Date: 01/09/2020 CLINICAL DATA:  Shortness of breath EXAM: PORTABLE CHEST 1 VIEW COMPARISON:  08/09/2016 FINDINGS: Cardiomegaly. Normal aortic and hilar contours. There is no edema, consolidation, effusion, or pneumothorax. IMPRESSION: 1. No acute finding. 2. Cardiomegaly. Electronically Signed   By: Monte Fantasia M.D.   On: 01/09/2020 06:10    EKG: Independently reviewed.  Sinus rhythm Right bundle branch block  Assessment/Plan Principal Problem:   Hyperglycemia due to diabetes mellitus (HCC) Active Problems:   Anxiety and depression   Hypothyroidism   AKI (acute kidney injury) (HCC)   Elevated troponin level     Hyperglycemia due to diabetes mellitus   Patient with a known  history of diabetes mellitus who states that her medication was recently changed by her primary care provider who presents to the ER with significant hyperglycemia, serum glucose greater than 700 with a bicarbonate level of 18 and an anion gap of 13 Patient also admits to dietary indiscretion We will start patient on an insulin drip per protocol Judicious IV fluid resuscitation and will monitor respiratory status closely   Acute on chronic kidney injury At baseline patient has a serum creatinine of 1.07 and on admission it is 2.03 At baseline patient stage IIIb chronic kidney disease Acute kidney injury appears to be secondary to osmotic diuresis from hyperglycemia Judicious IV fluid resuscitation Repeat renal parameters in a.m.   Hypothyroidism Continue Synthroid   Elevated troponin level Concern for possible ischemic event Troponin level shows an upward trend Will obtain 2D echocardiogram to assess LVEF and rule out regional wall motion abnormality Continue aspirin, Plavix, nitrates and statins Patient not on a beta-blocker due to bradycardia Consult cardiology   Anxiety and depression Continue Buspar    DVT prophylaxis: Lovenox Code Status: Full code  Family Communication: Greater than 50% of time was spent discussing patient's condition and plan of care with her and her husband at the bedside.  All questions and concerns have been addressed.  They verbalized understanding and agree with the plan. Disposition Plan: Back to previous home environment Consults called: Cardiology    Jamie Buser MD [facility]     01/09/2020, 8:59 AM

## 2020-01-09 NOTE — ED Notes (Signed)
Pharmacy called to re-time potassium.

## 2020-01-09 NOTE — ED Notes (Signed)
Patient ambulated with slow, steady gait to room commode with stand-by assist. Patient becomes dyspneic with exertion.

## 2020-01-09 NOTE — ED Notes (Signed)
Dinner Tray is not available at this time.

## 2020-01-09 NOTE — Progress Notes (Signed)
Inpatient Diabetes Program Recommendations  AACE/ADA: New Consensus Statement on Inpatient Glycemic Control   Target Ranges:  Prepandial:   less than 140 mg/dL      Peak postprandial:   less than 180 mg/dL (1-2 hours)      Critically ill patients:  140 - 180 mg/dL   Results for Jamie Murray, Jamie Murray (MRN [id number]) as of 01/09/2020 13:22  Ref. Range 01/09/2020 09:55 01/09/2020 11:12 01/09/2020 12:26  Glucose-Capillary Latest Ref Range: 70 - 99 mg/dL >600 (HH) 552 (HH) 427 (H)  Results for Jamie Murray, Jamie Murray (MRN [id number]) as of 01/09/2020 13:22  Ref. Range 01/09/2020 05:54  Glucose Latest Ref Range: 70 - 99 mg/dL 792 (HH)   Review of Glycemic Control  Diabetes history: DM2 Outpatient Diabetes medications: Farxiga 10 mg daily Current orders for Inpatient glycemic control: IV insulin  Inpatient Diabetes Program Recommendations:    Outpatient DM medication: Patient would prefer to go back on prior DM medications (Trulicity, Glimepiride, and Metformin) at time of discharge if appropriate.  NOTE: Noted patient in Emergency Department with glucose of 792 mg/dl at 5:54 am today and is currently ordered IV insulin. Per H&P, patient had recent change in her DM medications. Spoke with patient at bedside. Patient alert and oriented. Patient states that she was taking Trulicity, Glimepiride, and Metformin for DM but when she went to her appointment with PCP on 12/16/19 she told her PCP that she felt she was on too many medications and wanted to see about what changes could be made to get her off some of her medications. Patient also notes that she was having lumps in her neck and she thought they may be from taking Trulcity injections. Therefore, PCP had her stop Trulicity, Metformin and Glimepiride and start taking [place] as the only DM medication.  Patient reports that she has a follow up appointment on 01/16/20 with PCP to evaluate how glucose was doing with just [place]. Patient states that her A1C was  7.1% on 12/16/19 and 6.8% prior to that. Patient notes that she had fairly well controlled DM on Trulicity, Glimepiride, and Metformin and she states that she would like to just go back on those medications at time of discharge. Patient has never taken insulin for DM and she states that she does not want to use insulin outpatient but she understands that insulin will be used inpatient. Discussed how IV insulin would be used to treat hyperglycemia and then she would be transitioned to SQ insulin while inpatient. Patient verbalized understanding of information and states she has no questions or concerns related to DM at this time. Will continue to follow along while inpatient.  Thanks, Barnie Alderman, RN, MSN, CDE Diabetes Coordinator Inpatient Diabetes Program [phone] (Team Pager from 8am to 5pm)

## 2020-01-09 NOTE — ED Triage Notes (Signed)
Pt arrives from home via [facility] with complaint of high blood sugar since possibly Sunday. Pt reports feeling unwell since Sunday, some SOB, most weak/tired over past couple days. Last known CBG was 180 at home on Sunday.   Pt does not normally take insulin at home, was taken off metformin and placed on alternate PO glucose med  Pt a&o x4 on arrival

## 2020-01-09 NOTE — Progress Notes (Signed)
*  PRELIMINARY RESULTS* Echocardiogram 2D Echocardiogram has been performed.  Jamie Murray 01/09/2020, 12:15 PM

## 2020-01-09 NOTE — ED Notes (Addendum)
Pt assisted to bathroom by this RN. Pt stating she feels increased SOB upon ambulating

## 2020-01-09 NOTE — Consult Note (Addendum)
Cardiology Consultation:   Patient ID: Jamie Murray MRN: [id number]; DOB: 12-29-1945  Admit date: 01/09/2020 Date of Consult: 01/09/2020  Primary Care Provider: Perrin Maltese, MD Primary Cardiologist: Dr. Humphrey Rolls; [facility] rounding, Dr. Fletcher Anon Primary Electrophysiologist:  None    Patient Profile:   Jamie Murray is a 74 y.o. female with a hx of HFpEF (06/2018 echo: severe pulmonary HTN, severe pulmonary regurgitation / TR, mild to moderate AR), COPD, OSA on CPAP, DM2, vertigo, known RBBB, extensive second hand smoke exposure, GERD, hypthyroidism, HLD, and who is being seen today for the evaluation of elevated HS Tn at the request of Dr. Francine Graven.  History of Present Illness:   Jamie Murray is a 74 year old female with PMH as above.  She has been followed by Dr. Chancy Milroy for many years (10 years).  [facility] cardiology has been consulted given availability today.  She is also followed by Dr. Mortimer Fries of pulmonology.  She was last seen by pulmonology 03/2018, at which time she was reporting shortness of breath and dyspnea on exertion with several months of lower extremity edema.  She felt very tired all of the time.  It was noted that she had recently been diagnosed with OSA on CPAP therapy.  It was also noted she had extensive secondhand smoking history for 26 years.  Notes indicate that she had at least mild COPD. Her PCP had obtained a CT abdomen that showed's ascites and pleural effusion with etiology unknown.  She reports a recent echocardiogram, performed by Dr. Chancy Milroy.  Unfortunately, on review of EMR, most recent echo able to be found is 06/2018.    06/2018 echo showed nl EF, pulmonary HTN, right heart strain, G1DD, pericardial effusion, and valvular dz. Specifically, and as copied below, it noted severe biatrial enlargement, mildly dilated RV, mild decrease in RVSF, D-shaped septum, hypokinetic RV free wall, mild to moderate LVH, G1DD, moderate to severe pulmonary regurgitation, severe tricuspid  regurgitation, severe pulmonary hypertension, mild mitral regurgitation, mild to moderate aortic regurgitation, trivial anterior and posterior pericardial effusion.  5/25 she underwent a renal ultrasound that showed no hydronephrosis, increased renal echogenicity reflecting likely medical renal disease, small angiomyolipoma of the left kidney on prior study not visualized.  Today, she presents to [facility] ED with SOB that she reports started around 2-3AM and lasted until presenting to the ED. She denies chest pain, palpitations, dyspnea, pnd, orthopnea, n, v, syncope, edema, weight gain, or early satiety.  She reports diaphoresis, sweating that has been ongoing now for a couple of years.  In addition, she reports chronic dizziness, which she attributes to her ongoing diagnosis of vertigo.  She has not been able to change over to her new medication for vertigo.  She reports that she has recently had elevated blood sugars in the context of switching between medications.  Otherwise, she usually monitors her blood sugar very closely at home.  She denies any signs or symptoms of bleeding. She is uncertain what her BP normally runs. She has not taken her BP medication this AM.  She reports that her left big toe lost its nail and is healing well. No vision changes or headache recently.   In the ED, vitals significant for BP 178/85, HR 65 bpm, RR 16, 100% on room air.  EKG showed  Bifascicular block with first degree bundle, RBBB, intermittent dropped beat. Chest x-ray showed cardiomegaly and otherwise no acute findings.  Labs showed sodium 132, potassium 4.3, glucose 792, creatinine 2.03, BUN 36, AST 36, ALT 26,  BNP 2387.1, high-sensitivity troponin 83  93  90, WBC 3.9, hemoglobin 10.2, RBC 3.38, hematocrit 30.5, platelets 112.  COVID-19 negative.  While in the room with patient, BP 150/110 with patient asx.   She is on PTA Plavix for reasons unclear. Will attempt to get Dr. Humphrey Rolls records.   Heart Pathway Score:       Past Medical History:  Diagnosis Date  . Allergy   . Anemia   . Anxiety   . COPD (chronic obstructive pulmonary disease) ([place])   . Depression   . Diabetes ([place])   . GERD (gastroesophageal reflux disease)   . Hyperlipidemia   . Hypertension   . Insomnia   . Migraines   . Osteoporosis   . [facility] spotted fever   . Transient cerebral ischemia   . Vertigo    every 2-3 months    Past Surgical History:  Procedure Laterality Date  . ABDOMINAL HYSTERECTOMY    . CORONARY ANGIOPLASTY    . ESOPHAGOGASTRODUODENOSCOPY (EGD) WITH PROPOFOL N/A 08/24/2015   Procedure: ESOPHAGOGASTRODUODENOSCOPY (EGD) WITH PROPOFOL with dialation;  Surgeon: Lucilla Lame, MD;  Location: [place];  Service: Endoscopy;  Laterality: N/A;  Diabetic - oral meds  . TUBAL LIGATION       Home Medications:  Prior to Admission medications   Medication Sig Start Date End Date Taking? Authorizing Provider  albuterol (VENTOLIN HFA) 108 (90 Base) MCG/ACT inhaler Inhale 2 puffs into the lungs every 6 (six) hours as needed for wheezing or shortness of breath.   Yes [provider]  aspirin 81 MG tablet Take 81 mg by mouth daily.   Yes [provider]  atorvastatin (LIPITOR) 10 MG tablet Take 1 tablet (10 mg total) by mouth daily. 06/08/16  Yes Kathrine Haddock, NP  FARXIGA 10 MG TABS tablet Take 10 mg by mouth daily. 12/27/19  Yes [provider]  Fe Fum-FA-B Cmp-C-Zn-Mg-Mn-Cu (HEMOCYTE PLUS) 106-1 MG CAPS Take 1 capsule by mouth daily. 03/23/18  Yes [provider]  furosemide (LASIX) 20 MG tablet Take 20 mg by mouth daily as needed. 12/15/16  Yes [provider]  hydrALAZINE (APRESOLINE) 25 MG tablet TAKE 4 TABLETS BY MOUTH EVERY MORNING & TAKE 4 TABLETS BY MOUTH EVERY EVENING 03/21/18  Yes [provider]  levothyroxine (SYNTHROID) 75 MCG tablet Take 75 mcg by mouth daily before breakfast.   Yes [provider]  loratadine (CLARITIN) 10 MG tablet  Take 10 mg by mouth daily.   Yes [provider]  losartan (COZAAR) 100 MG tablet Take 100 mg by mouth daily. 12/29/16  Yes [provider]  meclizine (ANTIVERT) 25 MG tablet Take 25 mg by mouth 3 (three) times daily as needed for dizziness.   Yes [provider]  metoprolol succinate (TOPROL-XL) 50 MG 24 hr tablet Take 50 mg by mouth daily. Take with or immediately following a meal.   Yes [provider]  Multiple Vitamins-Minerals ([place]) CHEW Chew by mouth daily.   Yes [provider]  spironolactone (ALDACTONE) 50 MG tablet Take 50 mg by mouth daily.   Yes [provider]  VIIBRYD 20 MG TABS Take 1 tablet by mouth daily. 12/27/19  Yes [provider]  zaleplon (SONATA) 10 MG capsule Take 10 mg by mouth at bedtime. 12/26/19  Yes [provider]  Dulaglutide (TRULICITY) 1.5 EX/5.2WU SOPN Inject 1.5 mg into the skin once a week. 06/08/16   Kathrine Haddock, NP  escitalopram (LEXAPRO) 20 MG tablet  Take 20 mg by mouth daily.  02/19/18   [provider]  metFORMIN (GLUCOPHAGE) 500 MG tablet Take 1,000 mg by mouth 2 (two) times daily.  03/27/18   [provider]    Inpatient Medications: Scheduled Meds: . aspirin  81 mg Oral Daily  . atorvastatin  10 mg Oral Daily  . buPROPion  150 mg Oral BID  . busPIRone  7.5 mg Oral BID  . clopidogrel  75 mg Oral Daily  . enoxaparin (LOVENOX) injection  30 mg Subcutaneous Q24H  . Hemocyte Plus  1 capsule Oral Daily  . isosorbide mononitrate  30 mg Oral Daily  . levothyroxine  50 mcg Oral Q0600  . multivitamin  1 tablet Oral Daily   Continuous Infusions: . sodium chloride 75 mL/hr at 01/09/20 1029  . sodium chloride    . dextrose 5 % and 0.45% NaCl    . insulin 9.5 Units/hr (01/09/20 1000)  . potassium chloride 10 mEq (01/09/20 1028)   PRN Meds: acetaminophen **OR** acetaminophen, dextrose, LORazepam  Allergies:    Allergies  Allergen Reactions  .  Prozac [Fluoxetine Hcl] Other (See Comments)    "felt crazy"    Social History:   Social History   Socioeconomic History  . Marital status: Married    Spouse name: [name]  . Number of children: 3  . Years of education: 37  . Highest education level: Not on file  Occupational History    Comment: work part time, day care  Tobacco Use  . Smoking status: Never Smoker  . Smokeless tobacco: Never Used  Vaping Use  . Vaping Use: Never used  Substance and Sexual Activity  . Alcohol use: No    Alcohol/week: 0.0 standard drinks  . Drug use: No  . Sexual activity: Never  Other Topics Concern  . Not on file  Social History Narrative   Lives with husband   Caffeine use- coffee 2 cups daily, Coke once a week, green tea daily   Social Determinants of Health   Financial Resource Strain:   . Difficulty of Paying Living Expenses:   Food Insecurity:   . Worried About Charity fundraiser in the Last Year:   . Arboriculturist in the Last Year:   Transportation Needs:   . Film/video editor (Medical):   Marland Kitchen Lack of Transportation (Non-Medical):   Physical Activity:   . Days of Exercise per Week:   . Minutes of Exercise per Session:   Stress:   . Feeling of Stress :   Social Connections:   . Frequency of Communication with Friends and Family:   . Frequency of Social Gatherings with Friends and Family:   . Attends Religious Services:   . Active Member of Clubs or Organizations:   . Attends Archivist Meetings:   Marland Kitchen Marital Status:   Intimate Partner Violence:   . Fear of Current or Ex-Partner:   . Emotionally Abused:   Marland Kitchen Physically Abused:   . Sexually Abused:     Family History:    Family History  Problem Relation Age of Onset  . Diabetes Mother   . Hypertension Mother   . Stroke Mother   . Heart disease Mother        MI  . Cancer Father        pancreatic  . Stroke Father   . Hypertension Sister   . Diabetes Brother   . Diabetes Maternal Grandfather   .  Diabetes Sister   .  Diabetes Sister   . Diabetes Brother   . Diabetes Brother      ROS:  Please see the history of present illness.  Review of Systems  Constitutional: Positive for diaphoresis.       Chronic diaphoresis, unchanged for years  Respiratory: Positive for shortness of breath. Negative for hemoptysis.        Chronic shortness of breath, worsened since 2 AM 8/5 and alleviated upon presentation to ED  Cardiovascular: Negative for chest pain, palpitations and leg swelling.  Gastrointestinal: Negative for abdominal pain, blood in stool, melena, nausea and vomiting.  Musculoskeletal: Negative for falls.  Skin:       L toe recently lost the nail of her big toe, healing well  Neurological: Positive for dizziness. Negative for sensory change, loss of consciousness and headaches.       Chronic vertigo.  Has reportedly not been able to change over to her new medication for vertigo.  All other systems reviewed and are negative.   All other ROS reviewed and negative.     Physical Exam/Data:   Vitals:   01/09/20 0632 01/09/20 0730 01/09/20 0738 01/09/20 0930  BP: (!) 156/83 (!) 144/86  (!) 157/80  Pulse: (!) 56 (!) 50 61 (!) 51  Resp: (!) 21   (!) 21  Temp:      TempSrc:      SpO2: 98% 99% 100% 100%  Weight:      Height:        Intake/Output Summary (Last 24 hours) at 01/09/2020 1044 Last data filed at 01/09/2020 0803 Gross per 24 hour  Intake --  Output 400 ml  Net -400 ml   Last 3 Weights 01/09/2020 04/16/2018 03/13/2018  Weight (lbs) 165 lb 165 lb 171 lb  Weight (kg) 74.844 kg 74.844 kg 77.565 kg     Body mass index is 25.84 kg/m.  General:  Well nourished, well developed, in no acute distress HEENT: normal Neck: no JVD Vascular: No carotid bruits; radial pulses 1+ bilaterally Cardiac:  Bradycardic but regular, normal S1, S2; distant heart sounds; 2/6 systolic murmur and diatolic murmur  Lungs:  Reduced breath sounds bilaterally, right sided crackles Abd: soft,  nontender, no hepatomegaly  Ext: no edema Musculoskeletal:  No deformities, BUE and BLE strength normal and equal Skin: warm and dry  Neuro:  No focal abnormalities noted Psych:  Normal affect   EKG:  The EKG was personally reviewed and demonstrates: Bifascicular block with first degree bundle, RBBB, intermittent dropped beat  Telemetry:  Telemetry was personally reviewed and demonstrates:  SR but just recently started on my arrival  Relevant CV Studies: 07/05/2018 Echocardiogram Dr. Posey Rea Technically adequate study Severe biatrial enlargement mildly dilated right ventricle with left ventricle and aorta appear normal in size.  Normal LV systolic function Mild RV systolic function Normal LV wall motion with D-shaped septum Right ventricular free wall is hypokinetic Mild to moderate left ventricular hypertrophy with grade 1 diastolic dysfunction Moderate to severe pulmonary regurgitation Severe tricuspid regurgitation Severe pulmonary hypertension Mild mitral regurgitation Mild to moderate aortic regurgitation Trivial anterior and posterior pericardial effusion without hemodynamic compromise or tamponade No evidence of right to left shunting  Laboratory Data:  High Sensitivity Troponin:   Recent Labs  Lab 01/09/20 0554 01/09/20 0747 01/09/20 0952  TROPONINIHS 84* 93* 90*     Cardiac EnzymesNo results for input(s): TROPONINI in the last 168 hours. No results for input(s): TROPIPOC in the last 168 hours.  Chemistry Recent Labs  Lab 01/09/20 0554  NA 132*  K 4.3  CL 101  CO2 18*  GLUCOSE 792*  BUN 36*  CREATININE 2.03*  CALCIUM 8.8*  GFRNONAA 24*  GFRAA 27*  ANIONGAP 13    Recent Labs  Lab 01/09/20 0554  PROT 6.8  ALBUMIN 3.7  AST 36  ALT 26  ALKPHOS 71  BILITOT 1.2   Hematology Recent Labs  Lab 01/09/20 0554 01/09/20 0952  WBC 3.9* 3.9*  RBC 3.38* 3.41*  HGB 10.2* 10.1*  HCT 30.5* 31.4*  MCV 90.2 92.1  MCH 30.2 29.6  MCHC 33.4 32.2   RDW 14.6 14.6  PLT 112* 110*   BNP Recent Labs  Lab 01/09/20 0632  BNP 2,387.1*    DDimer No results for input(s): DDIMER in the last 168 hours.   Radiology/Studies:  DG Chest Portable 1 View  Result Date: 01/09/2020 CLINICAL DATA:  Shortness of breath EXAM: PORTABLE CHEST 1 VIEW COMPARISON:  08/09/2016 FINDINGS: Cardiomegaly. Normal aortic and hilar contours. There is no edema, consolidation, effusion, or pneumothorax. IMPRESSION: 1. No acute finding. 2. Cardiomegaly. Electronically Signed   By: Monte Fantasia M.D.   On: 01/09/2020 06:10    Assessment and Plan:   Shortness of breath Right heart failure, official echo pending --Reports SOB now resolved. History of chronic SOB, DOE that worsened overnight and resolved with presentation in the ED. Also history of dizziness with EKG showing bifascicular block and sinus pauses. Echo pending. Previous echo with cardiomegaly and pulmonary HTN / valvular dz. Also previous echo with R heart strain and severe dilation. Impressive cardiomegaly by CXR.  Past history of pericardial effusion.   --Considered is pulmonary embolism, especially given right heart strain. Consider as main etiology of SOB. Does report right leg chronically larger than left. Oxygen saturation currently stable.  --Would recommend V/Q scan to assess for chronic pulmonary thrombosis. CTA not ideal given renal function. Consider ultrasound of lower extremities.  --Caution with IVF as BNP 2,387.1. As treating hyperglycemia with fluids, will need to monitor volume status closely. --Ideally, will obtain R/LHC on Monday and if renal function improved. If renal function not improved, obtain RHC alone. --Recommend BP support - BP poorly controlled. Restart PTA hydralazine. --Caution with AV nodal blocking agents given bradycardia. --Caution with nephrotoxins, including ACE/ARB/ARNI. --Daily BMET. Replete electrolytes. Consider hyperglycemia may be falsely elevating K value. --Will  check TSH and labs for further risk stratification.  --Further recommendations pending official read of updated echo.   Elevated HS Tn Supply demand ischemia --No CP. Did report SOB, likely due to severe R heart strain. --Considered also is anginal equivalent. --HS Tn minimally elevated and flat trending. --EKG without acute ST/T changes. --More consistent with supply demand ischemia 2/2 comorbid conditions. --Pending repeat echo to reassess EF, wall motion. R heart pressure. --Monitor volume status. Monitor renal function. --No BB given bradycardia. Monitor telemetry.  --No ACE/ARB/ARNI given renal function. --Continue PTA ASA, Plavix, nitrates, statin. --Obtain V/Q scan as above. Concern for PE.  --Tentative plan for [facility] on Monday 01/13/20.  --Further recommendations pending echo.   Essential HTN, poorly controlled --Restart home hydralazine.  --Closely monitor volume status. --Avoid AV nodal blocking agents given bifascicular block, pauses. --Avoid nephrotoxins, including ACE/ARB/ARNI.   AOCKD --Likely anemia of chronic dz due to poorly controlled BP/DM2. --Caution with nephrotoxins.  --Will reassess if L/RHC able to be performed on Monday. If not, plan for RHC. --Recent renal scan as outlined above in HPI.  --Caution with IVF and volume status.  Hyperglycemia Likely hypokalemia --Caution with potassium, as counts likely much lower due to hyperglycemia. --Glycemic control recommended.  Hypothyroidism --Continue Synthroid  OSA --CPAP recommended.  HLD --Continue statin.   For questions or updates, please contact [place] Please consult [email or web address] for contact info under     Signed, Arvil Chaco, PA-C  01/09/2020 10:44 AM

## 2020-01-09 NOTE — ED Provider Notes (Signed)
Assumed care from Dr. Karma Greaser at 7 AM. Briefly, the patient is a 74 y.o. female with PMHx of  has a past medical history of Allergy, Anemia, Anxiety, COPD (chronic obstructive pulmonary disease) ([place]), Depression, Diabetes ([place]), GERD (gastroesophageal reflux disease), Hyperlipidemia, Hypertension, Insomnia, Migraines, Osteoporosis, [facility] spotted fever, Transient cerebral ischemia, and Vertigo. here with hyperglycemia after switching from metformin to new oral agent, here with hyperglycemia and polyuria.   Labs Reviewed  CBC WITH DIFFERENTIAL/PLATELET - Abnormal; Notable for the following components:      Result Value   WBC 3.9 (*)    RBC 3.38 (*)    Hemoglobin 10.2 (*)    HCT 30.5 (*)    Platelets 112 (*)    All other components within normal limits  SARS CORONAVIRUS 2 BY RT PCR (HOSPITAL ORDER, [place] [facility])  BETA-HYDROXYBUTYRIC ACID  COMPREHENSIVE METABOLIC PANEL  MAGNESIUM  BRAIN NATRIURETIC PEPTIDE  TROPONIN I (HIGH SENSITIVITY)    Course of Care: -Lab work shows marked hyperglycemia with bicarb of 18.  Beta hydroxybutyric acid is elevated.  Patient also has troponin elevation which I suspect is demand.  She has no chest pain and EKG is nonischemic when compared to priors.  Given her decreased bicarb, hyperglycemia, and elevated beta hydroxybutyric acid, will start on insulin drip and plan to admit.  Anion gap is upper range of normal, but no signs of severe DKA.  CRITICAL CARE Performed by: Evonnie Pat   Total critical care time: 35 minutes  Critical care time was exclusive of separately billable procedures and treating other patients.  Critical care was necessary to treat or prevent imminent or life-threatening deterioration.  Critical care was time spent personally by me on the following activities: development of treatment plan with patient and/or surrogate as well as nursing, discussions with consultants, evaluation of patient's response  to treatment, examination of patient, obtaining history from patient or surrogate, ordering and performing treatments and interventions, ordering and review of laboratory studies, ordering and review of radiographic studies, pulse oximetry and re-evaluation of patient's condition.      Duffy Bruce, MD 01/09/20 [phone]

## 2020-01-09 NOTE — ED Notes (Signed)
This RN messaged admitting MD to order lantus. Pt CBG 206

## 2020-01-09 NOTE — ED Provider Notes (Signed)
[facility] Emergency Department Provider Note  ____________________________________________   First MD Initiated Contact with Patient 01/09/20 [phone]     (approximate)  I have reviewed the triage vital signs and the nursing notes.   HISTORY  Chief Complaint Hyperglycemia    HPI Jamie Murray is a 74 y.o. female with medical history as listed below who presents for evaluation of shortness of breath for several days and the high blood sugar.  The patient called EMS originally because of her shortness of breath.  She said that it has been relatively stable for the last few days.  Nothing particular makes it better or worse.  She has not had any chest pain or abdominal pain.  She reports that she has felt tired but without any other specific symptoms.  However as she was talking she reported that she has had some blurry vision over the last few days and said that her glucometer has not been working for about the last week.  She recently changed diabetes medicine from Metformin to a different medication.  Her last blood sugar almost a week ago was in the 180s but she does not know what it is been since then.  However she has had substantially increased urinary frequency and increased thirst.  No dysuria.  Overall her symptoms are severe nothing in particular makes them better or worse.  EMS reported that her blood sugar read "high" on her glucometer.        Past Medical History:  Diagnosis Date  . Allergy   . Anemia   . Anxiety   . COPD (chronic obstructive pulmonary disease) ([place])   . Depression   . Diabetes ([place])   . GERD (gastroesophageal reflux disease)   . Hyperlipidemia   . Hypertension   . Insomnia   . Migraines   . Osteoporosis   . [facility] spotted fever   . Transient cerebral ischemia   . Vertigo    every 2-3 months    Patient Active Problem List   Diagnosis Date Noted  . Pulmonary hypertension, primary ([place]) 11/29/2016  .  Mobility impaired 08/26/2016  . Chest pain, rule out acute myocardial infarction 08/09/2016  . Chronic fatigue 05/11/2016  . Anemia 05/11/2016  . Depression 05/03/2016  . Hypomagnesemia 03/22/2016  . Prolonged Q-T interval on ECG 03/22/2016  . Macrocytosis 01/25/2016  . Diarrhea, functional 01/25/2016  . Hypokalemia 12/22/2015  . Heartburn   . Hiatal hernia   . Stricture and stenosis of esophagus   . Gastroesophageal reflux disease without esophagitis 07/22/2015  . Urinary tract infection 04/28/2015  . Osteoporosis 01/26/2015  . Insomnia 01/26/2015  . Allergic rhinitis 01/26/2015  . Anxiety and depression 01/26/2015  . Hypothyroidism 01/26/2015  . Hypertension 01/26/2015  . DM type 2 (diabetes mellitus, type 2) ([place]) 01/26/2015  . Hyperlipidemia 01/26/2015  . CVA (cerebral infarction) 01/26/2015  . Chronic tension-type headache, intractable 10/06/2014  . Disordered sleep 10/06/2014    Past Surgical History:  Procedure Laterality Date  . ABDOMINAL HYSTERECTOMY    . CORONARY ANGIOPLASTY    . ESOPHAGOGASTRODUODENOSCOPY (EGD) WITH PROPOFOL N/A 08/24/2015   Procedure: ESOPHAGOGASTRODUODENOSCOPY (EGD) WITH PROPOFOL with dialation;  Surgeon: Lucilla Lame, MD;  Location: [place];  Service: Endoscopy;  Laterality: N/A;  Diabetic - oral meds  . TUBAL LIGATION      Prior to Admission medications   Medication Sig Start Date End Date Taking? Authorizing Provider  aspirin 81 MG tablet Take 81 mg by mouth daily.  [provider]  atorvastatin (LIPITOR) 10 MG tablet Take 1 tablet (10 mg total) by mouth daily. 06/08/16   Kathrine Haddock, NP  Blood Glucose Monitoring Suppl ([place]) Rosa Sanchez See admin instructions. 02/23/16   [provider]  buPROPion (WELLBUTRIN SR) 150 MG 12 hr tablet Take 1 tablet (150 mg total) by mouth 2 (two) times daily. Take 1 tablet in the morning for 2 weeks then take twice and day. 11/29/16   Kathrine Haddock, NP  busPIRone  (BUSPAR) 7.5 MG tablet Take 7.5 mg by mouth 2 (two) times daily. 03/22/18   [provider]  clonazePAM (KLONOPIN) 1 MG tablet Take 1 mg by mouth at bedtime. 09/29/16   [provider]  clopidogrel (PLAVIX) 75 MG tablet Take 1 tablet (75 mg total) by mouth daily. 10/04/16   Kathrine Haddock, NP  Dulaglutide (TRULICITY) 1.5 YB/0.1BP SOPN Inject 1.5 mg into the skin once a week. 06/08/16   Kathrine Haddock, NP  EASY TOUCH TEST test strip USE TO CHECK BLOOD SUGAR ONCE DAILY 02/23/16   [provider]  escitalopram (LEXAPRO) 10 MG tablet Take 10 mg by mouth daily. 02/19/18   [provider]  Fe Fum-FA-B Cmp-C-Zn-Mg-Mn-Cu (HEMOCYTE PLUS) 106-1 MG CAPS Take 1 capsule by mouth daily. 03/23/18   [provider]  furosemide (LASIX) 20 MG tablet Take 20 mg by mouth daily as needed. 12/15/16   [provider]  glimepiride (AMARYL) 2 MG tablet Take 2 mg by mouth daily. 12/29/16   [provider]  hydrALAZINE (APRESOLINE) 25 MG tablet TAKE 4 TABLETS BY MOUTH EVERY MORNING & TAKE 4 TABLETS BY MOUTH EVERY EVENING 03/21/18   [provider]  hydrALAZINE (APRESOLINE) 50 MG tablet Take 100 mg by mouth 2 (two) times daily. 12/26/16   [provider]  ipratropium-albuterol (DUONEB) 0.5-2.5 (3) MG/3ML SOLN TAKE 1 VIAL VIA NEBULIZER AND INHALE BY MOUTH EVERY 4 HOURS AS NEEDED 03/07/18   Flora Lipps, MD  irbesartan-hydrochlorothiazide (AVALIDE) 150-12.5 MG tablet  04/15/14   [provider]  isosorbide mononitrate (IMDUR) 30 MG 24 hr tablet  04/29/14   [provider]  levothyroxine (SYNTHROID, LEVOTHROID) 50 MCG tablet TAKE ONE TABLET BY MOUTH EVERY MORNING - DOSE INCREASED 03/21/18   [provider]  LORazepam (ATIVAN) 0.5 MG tablet Take 1 tablet (0.5 mg total) by mouth daily as needed. 09/30/16   Kathrine Haddock, NP  losartan (COZAAR) 100 MG tablet Take 100 mg by mouth daily. 12/29/16   [provider]  meclizine  (ANTIVERT) 25 MG tablet  04/22/14   [provider]  metFORMIN (GLUCOPHAGE) 500 MG tablet Take 500 mg by mouth 2 (two) times daily. 03/27/18   [provider]  metFORMIN (GLUCOPHAGE-XR) 500 MG 24 hr tablet Take 4 tablets (2,000 mg total) by mouth daily with breakfast. 10/27/16   Park Liter P, DO  metoprolol succinate (TOPROL-XL) 100 MG 24 hr tablet Take 100 mg by mouth daily.  04/29/16   [provider]  Multiple Vitamin (MULTIVITAMIN) tablet Take 1 tablet by mouth daily.    [provider]  pantoprazole (PROTONIX) 40 MG tablet Take 1 tablet (40 mg total) by mouth 2 (two) times daily. 09/26/16   Kathrine Haddock, NP  PROAIR HFA 108 (90 Base) MCG/ACT inhaler INHALE 2 PUFFS BY MOUTH INTO THE LUNGS EVERY 4 HOURS AS NEEDED FOR WHEEZING OR SHORTNESS OF BREATH 07/17/18   Flora Lipps, MD  simvastatin (ZOCOR) 40 MG tablet  03/28/14   [provider]  sitaGLIPtin (JANUVIA) 100 MG tablet  04/30/14   [provider]  spironolactone (ALDACTONE) 25 MG tablet Take 25 mg by mouth daily. 12/26/16   [provider]  traZODone (DESYREL) 100 MG tablet TAKE ONE-HALF TO ONE TABLET BY MOUTH AT NIGHT FOR SLEEP 10/06/14   [provider]  zolpidem (AMBIEN) 5 MG tablet Take by mouth.    [provider]    Allergies Prozac [fluoxetine hcl]  Family History  Problem Relation Age of Onset  . Diabetes Mother   . Hypertension Mother   . Stroke Mother   . Heart disease Mother        MI  . Cancer Father        pancreatic  . Stroke Father   . Hypertension Sister   . Diabetes Brother   . Diabetes Maternal Grandfather   . Diabetes Sister   . Diabetes Sister   . Diabetes Brother   . Diabetes Brother     Social History Social History   Tobacco Use  . Smoking status: Never Smoker  . Smokeless tobacco: Never Used  Vaping Use  . Vaping Use: Never used  Substance Use Topics  . Alcohol use: No    Alcohol/week: 0.0 standard drinks  .  Drug use: No    Review of Systems Constitutional: Fatigue.  No fever/chills Eyes: No visual changes. ENT: No sore throat. Cardiovascular: Denies chest pain. Respiratory: +shortness of breath. Gastrointestinal: No abdominal pain.  No nausea, no vomiting.  No diarrhea.  No constipation. Genitourinary: Increased urinary frequency.  Increased thirst.  Negative for dysuria. Musculoskeletal: Negative for neck pain.  Negative for back pain. Integumentary: Negative for rash. Neurological: Negative for headaches, focal weakness or numbness.   ____________________________________________   PHYSICAL EXAM:  ED Triage Vitals  Enc Vitals Group     BP 01/09/20 0551 (!) 178/85     Pulse Rate 01/09/20 0551 65     Resp 01/09/20 0551 16     Temp 01/09/20 0551 97.8 F (36.6 C)     Temp Source 01/09/20 0551 Oral     SpO2 01/09/20 0551 100 %     Weight --      Height --      Head Circumference --      Peak Flow --      Pain Score 01/09/20 0553 0     Pain Loc --      Pain Edu? --      Excl. in [place]? --      Constitutional: Alert and oriented.  Generally well-appearing. Eyes: Conjunctivae are normal.  Head: Atraumatic. Nose: No congestion/rhinnorhea. Mouth/Throat: Patient is wearing a mask. Neck: No stridor.  No meningeal signs.   Cardiovascular: Normal rate, regular rhythm. Good peripheral circulation. Grossly normal heart sounds. Respiratory: Increased respiratory rate but with clear breath sounds throughout, no wheezes, rales, nor rhonchi. Gastrointestinal: Soft and nontender. No distention.  Musculoskeletal: No lower extremity tenderness nor edema. No gross deformities of extremities. Neurologic:  Normal speech and language. No gross focal neurologic deficits are appreciated.  Skin:  Skin is warm, dry and intact. Psychiatric: Mood and affect are normal. Speech and behavior are normal.  ____________________________________________   LABS (all labs ordered are listed, but only  abnormal results are displayed)  Labs Reviewed  CBC WITH DIFFERENTIAL/PLATELET - Abnormal; Notable for the following components:      Result Value   WBC 3.9 (*)    RBC 3.38 (*)    Hemoglobin 10.2 (*)  HCT 30.5 (*)    Platelets 112 (*)    All other components within normal limits  SARS CORONAVIRUS 2 BY RT PCR (HOSPITAL ORDER, [place] [facility])  BETA-HYDROXYBUTYRIC ACID  COMPREHENSIVE METABOLIC PANEL  MAGNESIUM  BRAIN NATRIURETIC PEPTIDE  TROPONIN I (HIGH SENSITIVITY)   ____________________________________________  EKG  ED ECG REPORT I, Hinda Kehr, the attending physician, personally viewed and interpreted this ECG.  Date: 01/09/2020 EKG Time: 6:29 Rate: 66 Rhythm: normal sinus rhythm QRS Axis: normal Intervals: RBBB ST/T Wave abnormalities: Non-specific ST segment / T-wave changes, but no clear evidence of acute ischemia. Narrative Interpretation: no definitive evidence of acute ischemia; does not meet STEMI criteria.   ____________________________________________  RADIOLOGY I, Hinda Kehr, personally viewed and evaluated these images (plain radiographs) as part of my medical decision making, as well as reviewing the written report by the radiologist.  ED MD interpretation:  No acute findings  Official radiology report(s): DG Chest Portable 1 View  Result Date: 01/09/2020 CLINICAL DATA:  Shortness of breath EXAM: PORTABLE CHEST 1 VIEW COMPARISON:  08/09/2016 FINDINGS: Cardiomegaly. Normal aortic and hilar contours. There is no edema, consolidation, effusion, or pneumothorax. IMPRESSION: 1. No acute finding. 2. Cardiomegaly. Electronically Signed   By: Monte Fantasia M.D.   On: 01/09/2020 06:10    ____________________________________________   PROCEDURES   Procedure(s) performed (including Critical Care):  Procedures   ____________________________________________   INITIAL IMPRESSION / MDM / [place] / ED COURSE  As  part of my medical decision making, I reviewed the following data within the [facility] notes reviewed and incorporated, Labs reviewed , EKG interpreted , Old chart reviewed, Patient signed out to Dr. Ellender Hose, Radiograph reviewed  and Notes from prior ED visits   Differential diagnosis includes, but is not limited to, DKA, HHS, uncomplicated hyperglycemia, COVID-19 or other respiratory viral infection, pneumonia, acute heart failure, less likely ACS or PE.  The patient is on the cardiac monitor to evaluate for evidence of arrhythmia and/or significant heart rate changes.  The patient is tachypneic but not in significant respiratory distress, is not using accessory muscles, and is able to speak in full sentences and has clear breath sounds.  It is not completely consistent with [place] breathing but if she is in DKA could explain her respiratory symptoms as well as the rest of the symptoms of hypoglycemia she is exhibiting.  Her vital signs are reassuring with no fever and no tachycardia and elevated blood pressure.  Labs are pending including BNP, magnesium, CMP, troponin, CBC, and high beta hydroxybutyric acid.  I have ordered a chest x-ray and EKG.  The patient received a small amount of normal saline in route to the hospital and I ordered another 1 L normal saline given her hyperglycemia noted by EMS.  I will reassess once lab work is back.     Clinical Course as of Jan 08 1729  Thu Jan 09, 2020  0708 Transferring ED care to on-coming ED physician to follow up labs and disposition appropriately.   [CF]    Clinical Course User Index [CF] Hinda Kehr, MD     ____________________________________________  FINAL CLINICAL IMPRESSION(S) / ED DIAGNOSES  Dyspnea Hyperglycemia   MEDICATIONS GIVEN DURING THIS VISIT:  Medications  sodium chloride 0.9 % bolus 1,000 mL (1,000 mLs Intravenous New Bag/Given 01/09/20 0600)     ED Discharge Orders    None       *Please note:  Jamie Murray was  evaluated in Emergency Department on 01/09/2020 for the symptoms described in the history of present illness. She was evaluated in the context of the global COVID-19 pandemic, which necessitated consideration that the patient might be at risk for infection with the SARS-CoV-2 virus that causes COVID-19. Institutional protocols and algorithms that pertain to the evaluation of patients at risk for COVID-19 are in a state of rapid change based on information released by regulatory bodies including the CDC and federal and state organizations. These policies and algorithms were followed during the patient's care in the ED.  Some ED evaluations and interventions may be delayed as a result of limited staffing during and after the pandemic.*  Note:  This document was prepared using Dragon voice recognition software and may include unintentional dictation errors.   Hinda Kehr, MD 01/09/20 [phone]

## 2020-01-09 NOTE — ED Notes (Signed)
Admitting MD stating insulin drip can be d/c due to pt not being in DKA. Repeat BMP will be collected

## 2020-01-10 ENCOUNTER — Inpatient Hospital Stay: Payer: Medicare PPO

## 2020-01-10 ENCOUNTER — Encounter: Payer: Self-pay | Admitting: Internal Medicine

## 2020-01-10 DIAGNOSIS — N189 Chronic kidney disease, unspecified: Secondary | ICD-10-CM

## 2020-01-10 DIAGNOSIS — R0602 Shortness of breath: Secondary | ICD-10-CM

## 2020-01-10 DIAGNOSIS — I272 Pulmonary hypertension, unspecified: Secondary | ICD-10-CM

## 2020-01-10 DIAGNOSIS — E039 Hypothyroidism, unspecified: Secondary | ICD-10-CM

## 2020-01-10 LAB — BASIC METABOLIC PANEL
Anion gap: 10 (ref 5–15)
BUN: 31 mg/dL — ABNORMAL HIGH (ref 8–23)
CO2: 16 mmol/L — ABNORMAL LOW (ref 22–32)
Calcium: 8.5 mg/dL — ABNORMAL LOW (ref 8.9–10.3)
Chloride: 111 mmol/L (ref 98–111)
Creatinine, Ser: 1.66 mg/dL — ABNORMAL HIGH (ref 0.44–1.00)
GFR calc Af Amer: 35 mL/min — ABNORMAL LOW (ref >60)
GFR calc non Af Amer: 30 mL/min — ABNORMAL LOW (ref >60)
Glucose, Bld: 362 mg/dL — ABNORMAL HIGH (ref 70–99)
Potassium: 4.3 mmol/L (ref 3.5–5.1)
Sodium: 137 mmol/L (ref 135–145)

## 2020-01-10 LAB — CBC
HCT: 28.8 % — ABNORMAL LOW (ref 36.0–46.0)
Hemoglobin: 9.6 g/dL — ABNORMAL LOW (ref 12.0–15.0)
MCH: 30.4 pg (ref 26.0–34.0)
MCHC: 33.3 g/dL (ref 30.0–36.0)
MCV: 91.1 fL (ref 80.0–100.0)
Platelets: 109 10*3/uL — ABNORMAL LOW (ref 150–400)
RBC: 3.16 MIL/uL — ABNORMAL LOW (ref 3.87–5.11)
RDW: 14.6 % (ref 11.5–15.5)
WBC: 4.1 10*3/uL (ref 4.0–10.5)
nRBC: 0 % (ref 0.0–0.2)

## 2020-01-10 LAB — GLUCOSE, CAPILLARY
Glucose-Capillary: 290 mg/dL — ABNORMAL HIGH (ref 70–99)
Glucose-Capillary: 303 mg/dL — ABNORMAL HIGH (ref 70–99)
Glucose-Capillary: 342 mg/dL — ABNORMAL HIGH (ref 70–99)

## 2020-01-10 LAB — HEMOGLOBIN A1C
Hgb A1c MFr Bld: 10.9 % — ABNORMAL HIGH (ref 4.8–5.6)
Mean Plasma Glucose: 266.13 mg/dL

## 2020-01-10 MED ORDER — TECHNETIUM TO 99M ALBUMIN AGGREGATED
4.0000 | Freq: Once | INTRAVENOUS | Status: AC | PRN
  Administered 2020-01-10: 4.89 via INTRAVENOUS

## 2020-01-10 MED ORDER — ENOXAPARIN SODIUM 40 MG/0.4ML ~~LOC~~ SOLN
40.0000 mg | SUBCUTANEOUS | Status: DC
  Administered 2020-01-10 – 2020-01-12 (×3): 40 mg via SUBCUTANEOUS
  Filled 2020-01-10 (×3): qty 0.4

## 2020-01-10 MED ORDER — VILAZODONE HCL 10 MG PO TABS
20.0000 mg | ORAL_TABLET | Freq: Every day | ORAL | Status: DC
  Administered 2020-01-10 – 2020-01-13 (×4): 20 mg via ORAL
  Filled 2020-01-10 (×5): qty 2

## 2020-01-10 MED ORDER — INSULIN ASPART 100 UNIT/ML ~~LOC~~ SOLN
0.0000 [IU] | Freq: Three times a day (TID) | SUBCUTANEOUS | Status: DC
  Administered 2020-01-10: 11 [IU] via SUBCUTANEOUS
  Administered 2020-01-11: 3 [IU] via SUBCUTANEOUS
  Administered 2020-01-11: 5 [IU] via SUBCUTANEOUS
  Administered 2020-01-11 – 2020-01-12 (×2): 3 [IU] via SUBCUTANEOUS
  Administered 2020-01-12: 8 [IU] via SUBCUTANEOUS
  Administered 2020-01-12: 5 [IU] via SUBCUTANEOUS
  Administered 2020-01-13: 11 [IU] via SUBCUTANEOUS
  Filled 2020-01-10 (×7): qty 1

## 2020-01-10 MED ORDER — HYDRALAZINE HCL 25 MG PO TABS
25.0000 mg | ORAL_TABLET | Freq: Three times a day (TID) | ORAL | Status: DC
  Administered 2020-01-10 – 2020-01-11 (×3): 25 mg via ORAL
  Filled 2020-01-10 (×3): qty 1

## 2020-01-10 MED ORDER — FUROSEMIDE 10 MG/ML IJ SOLN
40.0000 mg | Freq: Once | INTRAMUSCULAR | Status: AC
  Administered 2020-01-10: 40 mg via INTRAVENOUS
  Filled 2020-01-10: qty 4

## 2020-01-10 MED ORDER — LEVOTHYROXINE SODIUM 50 MCG PO TABS
75.0000 ug | ORAL_TABLET | Freq: Every day | ORAL | Status: DC
  Administered 2020-01-11 – 2020-01-13 (×3): 75 ug via ORAL
  Filled 2020-01-10 (×3): qty 1

## 2020-01-10 MED ORDER — INSULIN DETEMIR 100 UNIT/ML ~~LOC~~ SOLN
15.0000 [IU] | Freq: Every day | SUBCUTANEOUS | Status: DC
  Administered 2020-01-10: 15 [IU] via SUBCUTANEOUS
  Filled 2020-01-10 (×2): qty 0.15

## 2020-01-10 MED ORDER — SODIUM CHLORIDE 0.9 % IV SOLN: INTRAVENOUS | Status: DC

## 2020-01-10 MED ORDER — INSULIN ASPART 100 UNIT/ML ~~LOC~~ SOLN
11.0000 [IU] | Freq: Once | SUBCUTANEOUS | Status: AC
  Administered 2020-01-10: 11 [IU] via SUBCUTANEOUS
  Filled 2020-01-10: qty 1

## 2020-01-10 MED ORDER — INSULIN ASPART 100 UNIT/ML ~~LOC~~ SOLN
3.0000 [IU] | Freq: Three times a day (TID) | SUBCUTANEOUS | Status: DC
  Administered 2020-01-10 – 2020-01-13 (×8): 3 [IU] via SUBCUTANEOUS
  Filled 2020-01-10 (×8): qty 1

## 2020-01-10 MED ORDER — INSULIN ASPART 100 UNIT/ML ~~LOC~~ SOLN
0.0000 [IU] | Freq: Every day | SUBCUTANEOUS | Status: DC
  Administered 2020-01-10: 4 [IU] via SUBCUTANEOUS
  Administered 2020-01-11: 2 [IU] via SUBCUTANEOUS
  Administered 2020-01-12: 3 [IU] via SUBCUTANEOUS
  Filled 2020-01-10 (×3): qty 1

## 2020-01-10 NOTE — Progress Notes (Signed)
Patient ID: Jamie Murray, female   DOB: Jul 03, 1945, 74 y.o.   MRN: [id number] [facility] PROGRESS NOTE  Alexiz Cothran YSH:[id number] DOB: 18-Mar-1946 DOA: 01/09/2020 PCP: Perrin Maltese, MD  HPI/Subjective: Patient came in with shortness of breath.  She has limited exercise capacity.  She states her breathing is better today than yesterday.  She has a history of pulmonary hypertension and wears CPAP at night.  Objective: Vitals:   01/10/20 1119 01/10/20 1300  BP: (!) 142/79 (!) 172/84  Pulse: (!) 58 (!) 53  Resp: 16 18  Temp: 98 F (36.7 C)   SpO2: 100% 100%    Intake/Output Summary (Last 24 hours) at 01/10/2020 1457 Last data filed at 01/10/2020 1020 Gross per 24 hour  Intake 1702.31 ml  Output 0 ml  Net 1702.31 ml   Filed Weights   01/09/20 0555  Weight: 74.8 kg    ROS: Review of Systems  Respiratory: Positive for shortness of breath.   Cardiovascular: Negative for chest pain.  Gastrointestinal: Negative for abdominal pain.   Exam: Physical Exam HENT:     Nose: No mucosal edema.     Mouth/Throat:     Pharynx: No oropharyngeal exudate.  Eyes:     General: Lids are normal.     Conjunctiva/sclera: Conjunctivae normal.     Pupils: Pupils are equal, round, and reactive to light.  Cardiovascular:     Rate and Rhythm: Normal rate and regular rhythm.     Heart sounds: S1 normal and S2 normal. Murmur heard.  Systolic murmur is present with a grade of 2/6.   Pulmonary:     Breath sounds: Examination of the right-lower field reveals decreased breath sounds. Examination of the left-lower field reveals decreased breath sounds. Decreased breath sounds present. No wheezing, rhonchi or rales.  Abdominal:     Palpations: Abdomen is soft.     Tenderness: There is no abdominal tenderness.  Musculoskeletal:     Right ankle: No swelling.     Left ankle: No swelling.  Skin:    General: Skin is warm.     Findings: No rash.  Neurological:     Mental Status:  She is alert and oriented to person, place, and time.       Data Reviewed: Basic Metabolic Panel: Recent Labs  Lab 01/09/20 0554 01/09/20 1631 01/10/20 0429  NA 132* 137 137  K 4.3 3.8 4.3  CL 101 108 111  CO2 18* 20* 16*  GLUCOSE 792* 240* 362*  BUN 36* 29* 31*  CREATININE 2.03* 1.80* 1.66*  CALCIUM 8.8* 9.2 8.5*  MG 2.5*  --   --    Liver Function Tests: Recent Labs  Lab 01/09/20 0554  AST 36  ALT 26  ALKPHOS 71  BILITOT 1.2  PROT 6.8  ALBUMIN 3.7   CBC: Recent Labs  Lab 01/09/20 0554 01/09/20 0952 01/10/20 0429  WBC 3.9* 3.9* 4.1  NEUTROABS 2.5  --   --   HGB 10.2* 10.1* 9.6*  HCT 30.5* 31.4* 28.8*  MCV 90.2 92.1 91.1  PLT 112* 110* 109*   BNP (last 3 results) Recent Labs    01/09/20 0632  BNP 2,387.1*     CBG: Recent Labs  Lab 01/09/20 1421 01/09/20 1620 01/09/20 2104 01/10/20 0735 01/10/20 1121  GLUCAP 273* 206* 254* 290* 303*    Recent Results (from the past 240 hour(s))  SARS Coronavirus 2 by RT PCR (hospital order, performed in [facility] hospital lab) Nasopharyngeal Nasopharyngeal Swab  Status: None   Collection Time: 01/09/20  6:32 AM   Specimen: Nasopharyngeal Swab  Result Value Ref Range Status   SARS Coronavirus 2 NEGATIVE NEGATIVE Final    Comment: (NOTE) SARS-CoV-2 target nucleic acids are NOT DETECTED.  The SARS-CoV-2 RNA is generally detectable in upper and lower respiratory specimens during the acute phase of infection. The lowest concentration of SARS-CoV-2 viral copies this assay can detect is 250 copies / mL. A negative result does not preclude SARS-CoV-2 infection and should not be used as the sole basis for treatment or other patient management decisions.  A negative result may occur with improper specimen collection / handling, submission of specimen other than nasopharyngeal swab, presence of viral mutation(s) within the areas targeted by this assay, and inadequate number of viral copies (<250 copies /  mL). A negative result must be combined with clinical observations, patient history, and epidemiological information.  Fact Sheet for Patients:   StrictlyIdeas.no  Fact Sheet for Healthcare Providers: BankingDealers.co.za  This test is not yet approved or  cleared by the [place] FDA and has been authorized for detection and/or diagnosis of SARS-CoV-2 by FDA under an Emergency Use Authorization (EUA).  This EUA will remain in effect (meaning this test can be used) for the duration of the COVID-19 declaration under Section 564(b)(1) of the Act, 21 U.S.C. section 360bbb-3(b)(1), unless the authorization is terminated or revoked sooner.  Performed at [facility], [address] [place], [place] [address]      Studies: DG Chest 1 View  Result Date: 01/10/2020 CLINICAL DATA:  Hyperglycemia, shortness of breath EXAM: CHEST  1 VIEW COMPARISON:  01/09/2020 chest radiograph. FINDINGS: Cardiomegaly, unchanged. Aortic atherosclerotic calcifications. Minimal bibasilar atelectasis. No pneumothorax or pleural effusion. Osseous structures are unchanged. IMPRESSION: Minimal bibasilar atelectasis. Cardiomegaly, unchanged. Electronically Signed   By: Primitivo Gauze M.D.   On: 01/10/2020 11:20   NM Pulmonary Perfusion  Result Date: 01/10/2020 CLINICAL DATA:  74 year old female with chest pain and shortness of breath x1 month. COPD. EXAM: NUCLEAR MEDICINE PERFUSION LUNG SCAN TECHNIQUE: Perfusion images were obtained in multiple projections after intravenous injection of radiopharmaceutical. Ventilation scans intentionally deferred if perfusion scan and chest x-ray adequate for interpretation during COVID 19 epidemic. RADIOPHARMACEUTICALS:  4.9 mCi Tc-21mMAA IV COMPARISON:  Portable chest 1108 hours today. FINDINGS: Homogeneous perfusion radiotracer activity throughout both lungs. Cardiomegaly related mediastinal photopenia. No perfusion  defect identified. IMPRESSION: Homogeneous perfusion.  No evidence of pulmonary embolus. Electronically Signed   By: HGenevie AnnM.D.   On: 01/10/2020 14:30   DG Chest Portable 1 View  Result Date: 01/09/2020 CLINICAL DATA:  Shortness of breath EXAM: PORTABLE CHEST 1 VIEW COMPARISON:  08/09/2016 FINDINGS: Cardiomegaly. Normal aortic and hilar contours. There is no edema, consolidation, effusion, or pneumothorax. IMPRESSION: 1. No acute finding. 2. Cardiomegaly. Electronically Signed   By: JMonte FantasiaM.D.   On: 01/09/2020 06:10   ECHOCARDIOGRAM COMPLETE  Result Date: 01/09/2020    ECHOCARDIOGRAM REPORT   Patient Name:   PBERNEDETTE AUSTONCURRY Date of Exam: 01/09/2020 Medical Rec #:  [id number]            Height:       67.0 in Accession #:    [id number]           Weight:       165.0 lb Date of Birth:  4Jan 07, 1947            BSA:  1.863 m Patient Age:    39 years              BP:           150/110 mmHg Patient Gender: F                     HR:           60 bpm. Exam Location:  [facility] Procedure: 2D Echo, Color Doppler and Cardiac Doppler Indications:     I50.31 CHF-Acute Diastolic  History:         Patient has prior history of Echocardiogram examinations. COPD;                  Risk Factors:Hypertension, Diabetes and Dyslipidemia.  Sonographer:     Charmayne Sheer RDCS (AE) Referring Phys:  [id number] Collier Bullock Diagnosing Phys: Kathlyn Sacramento MD  Sonographer Comments: Suboptimal apical window. Image acquisition challenging due to COPD. IMPRESSIONS  1. Left ventricular ejection fraction, by estimation, is 55 to 60%. The left ventricle has normal function. The left ventricle has no regional wall motion abnormalities. There is mild left ventricular hypertrophy. Left ventricular diastolic parameters are consistent with Grade I diastolic dysfunction (impaired relaxation). There is the interventricular septum is flattened in systole, consistent with right ventricular pressure overload.  2. Right ventricular systolic  function is moderately reduced. The right ventricular size is moderately enlarged. Moderately increased right ventricular wall thickness. There is severely elevated pulmonary artery systolic pressure. The estimated right ventricular systolic pressure is 01.0 mmHg.  3. Right atrial size was mildly dilated.  4. The mitral valve is normal in structure. No evidence of mitral valve regurgitation. No evidence of mitral stenosis.  5. Tricuspid valve regurgitation is mild to moderate.  6. The aortic valve is normal in structure. Aortic valve regurgitation is trivial. Mild aortic valve sclerosis is present, with no evidence of aortic valve stenosis.  7. Pulmonic valve regurgitation is severe.  8. The inferior vena cava is dilated in size with <50% respiratory variability, suggesting right atrial pressure of 15 mmHg. FINDINGS  Left Ventricle: Left ventricular ejection fraction, by estimation, is 55 to 60%. The left ventricle has normal function. The left ventricle has no regional wall motion abnormalities. The left ventricular internal cavity size was normal in size. There is  mild left ventricular hypertrophy. The interventricular septum is flattened in systole, consistent with right ventricular pressure overload. Left ventricular diastolic parameters are consistent with Grade I diastolic dysfunction (impaired relaxation). Right Ventricle: The right ventricular size is moderately enlarged. Moderately increased right ventricular wall thickness. Right ventricular systolic function is moderately reduced. There is severely elevated pulmonary artery systolic pressure. The tricuspid regurgitant velocity is 4.13 m/s, and with an assumed right atrial pressure of 15 mmHg, the estimated right ventricular systolic pressure is 07.1 mmHg. Left Atrium: Left atrial size was normal in size. Right Atrium: Right atrial size was mildly dilated. Pericardium: There is no evidence of pericardial effusion. Mitral Valve: The mitral valve is normal  in structure. Normal mobility of the mitral valve leaflets. No evidence of mitral valve regurgitation. No evidence of mitral valve stenosis. MV peak gradient, 3.6 mmHg. The mean mitral valve gradient is 1.0 mmHg. Tricuspid Valve: The tricuspid valve is normal in structure. Tricuspid valve regurgitation is mild to moderate. No evidence of tricuspid stenosis. Aortic Valve: The aortic valve is normal in structure. Aortic valve regurgitation is trivial. Mild aortic valve sclerosis is present, with no evidence of aortic valve  stenosis. Aortic valve mean gradient measures 3.0 mmHg. Aortic valve peak gradient measures 7.3 mmHg. Aortic valve area, by VTI measures 1.67 cm. Pulmonic Valve: The pulmonic valve was normal in structure. Pulmonic valve regurgitation is severe. No evidence of pulmonic stenosis. Aorta: The aortic root is normal in size and structure. Venous: The inferior vena cava is dilated in size with less than 50% respiratory variability, suggesting right atrial pressure of 15 mmHg. IAS/Shunts: No atrial level shunt detected by color flow Doppler.  LEFT VENTRICLE PLAX 2D LVIDd:         2.93 cm  Diastology LVIDs:         2.08 cm  LV e' lateral:   3.59 cm/s LV PW:         1.23 cm  LV E/e' lateral: 13.1 LV IVS:        1.20 cm  LV e' medial:    3.37 cm/s LVOT diam:     2.00 cm  LV E/e' medial:  14.0 LV SV:         37 LV SV Index:   20 LVOT Area:     3.14 cm  RIGHT VENTRICLE RV Basal diam:  4.04 cm LEFT ATRIUM           Index       RIGHT ATRIUM           Index LA diam:      3.00 cm 1.61 cm/m  RA Area:     23.60 cm LA Vol (A4C): 20.3 ml 10.89 ml/m RA Volume:   74.30 ml  39.87 ml/m  AORTIC VALVE                   PULMONIC VALVE AV Area (Vmax):    1.64 cm    PV Vmax:       0.79 m/s AV Area (Vmean):   1.98 cm    PV Vmean:      52.400 cm/s AV Area (VTI):     1.67 cm    PV VTI:        0.097 m AV Vmax:           135.00 cm/s PV Peak grad:  2.5 mmHg AV Vmean:          76.300 cm/s PV Mean grad:  1.0 mmHg AV VTI:             0.220 m AV Peak Grad:      7.3 mmHg AV Mean Grad:      3.0 mmHg LVOT Vmax:         70.50 cm/s LVOT Vmean:        48.200 cm/s LVOT VTI:          0.117 m LVOT/AV VTI ratio: 0.53  AORTA Ao Root diam: 3.20 cm MITRAL VALVE               TRICUSPID VALVE MV Area (PHT): 2.80 cm    TR Peak grad:   68.2 mmHg MV Peak grad:  3.6 mmHg    TR Vmax:        413.00 cm/s MV Mean grad:  1.0 mmHg MV Vmax:       0.95 m/s    SHUNTS MV Vmean:      40.9 cm/s   Systemic VTI:  0.12 m MV Decel Time: 271 msec    Systemic Diam: 2.00 cm MV E velocity: 47.10 cm/s MV A velocity: 84.40 cm/s MV E/A ratio:  0.56 [employer]  MD Electronically signed by Kathlyn Sacramento MD Signature Date/Time: 01/09/2020/12:28:30 PM    Final     Scheduled Meds: . aspirin EC  81 mg Oral Daily  . atorvastatin  10 mg Oral q1800  . enoxaparin (LOVENOX) injection  40 mg Subcutaneous Q24H  . ferrous [id number] C-folic acid  1 capsule Oral Daily  . hydrALAZINE  25 mg Oral Q8H  . insulin aspart  0-15 Units Subcutaneous TID WC  . insulin aspart  0-5 Units Subcutaneous QHS  . insulin aspart  3 Units Subcutaneous TID WC  . insulin detemir  15 Units Subcutaneous QHS  . isosorbide mononitrate  30 mg Oral Daily  . [START ON 01/11/2020] levothyroxine  75 mcg Oral Q0600  . multivitamin with minerals  1 tablet Oral Daily  . Vilazodone HCl  20 mg Oral Daily  . zaleplon  10 mg Oral QHS   Continuous Infusions:  Assessment/Plan:  1. Shortness of breath with pulmonary hypertension.  VQ scan negative.  Cardiology planning on a right and left heart catheterization on Monday.  We will hold off on further IV fluids. 2. Type 2 diabetes mellitus with hyper glycemia and hyperlipidemia.  Hemoglobin A1c elevated at 10.9.  Case discussed with diabetes coordinator and pharmacist to increase detemir insulin up to 15 units daily. 3. Acute kidney injury on chronic kidney disease stage IIIb. (Recent creatinine 10/21/2019 1.57) 4. Hypothyroidism unspecified on  levothyroxine 5. Anxiety depression continue psychiatric medications 6. Weakness.  Physical therapy evaluation 7. Sleep apnea CPAP at night 8. Essential hypertension on hydralazine    Code Status:     Code Status Orders  (From admission, onward)         Start     Ordered   01/09/20 0913  Full code  Continuous        01/09/20 0915        Code Status History    Date Active Date Inactive Code Status Order ID Comments User Context   08/09/2016 2359 08/11/2016 1556 Full Code [id number]  Harvie Bridge, DO Inpatient   12/22/2015 0816 12/24/2015 1352 Full Code [id number]  Demetrios Loll, MD Inpatient   Advance Care Planning Activity     Family Communication: Family at the bedside and on conference call on the phone while I was in the room Disposition Plan: Status is: Inpatient  Dispo: The patient is from: Home              Anticipated d/c is to: Home              Anticipated d/c date is: Earliest potential will be 01/14/2020              Patient currently being evaluated for pulmonary hypertension and shortness of breath.  Consultants:  Cardiology  Time spent: 28 minutes  [place]

## 2020-01-10 NOTE — Progress Notes (Signed)
Inpatient Diabetes Program Recommendations  AACE/ADA: New Consensus Statement on Inpatient Glycemic Control (2015)  Target Ranges:  Prepandial:   less than 140 mg/dL      Peak postprandial:   less than 180 mg/dL (1-2 hours)      Critically ill patients:  140 - 180 mg/dL  Results for Jamie Murray, Jamie Murray (MRN [id number]) as of 01/10/2020 08:06  Ref. Range 01/09/2020 09:55 01/09/2020 11:12 01/09/2020 12:26 01/09/2020 14:21 01/09/2020 16:20 01/09/2020 21:04 01/10/2020 07:35  Glucose-Capillary Latest Ref Range: 70 - 99 mg/dL >600 (HH) 552 (HH) 427 (H) 273 (H) 206 (H) 254 (H) 290 (H)   Results for Jamie Murray, Jamie Murray (MRN [id number]) as of 01/10/2020 08:06  Ref. Range 01/09/2020 16:31  Hemoglobin A1C Latest Ref Range: 4.8 - 5.6 % 10.9 (H)   Review of Glycemic Control  Diabetes history: DM2 Outpatient Diabetes medications: Farxiga 10 mg daily (started 5/88/50; was on Trulicity, Metformin, and Glimepiride but was all stopped on 12/16/19 when [place] started) Current orders for Inpatient glycemic control: Levevmir 10 units QHS, Novolog 0-15 units TID with meals  Inpatient Diabetes Program Recommendations:    Insulin-Please consider increasing Levemir to 15 units QHS, ordering Novolog 3 units TID with meals for meal coverage and Novolog 0-5 units QHS for bedtime correction.  Outpatient DM medication: Patient would prefer to go back on prior DM medications (Trulicity, Glimepiride, and Metformin) at time of discharge if appropriate.  HbgA1C:  A1C 10.9% on 01/09/20 indicating an average glucose of 266 mg/dl over the past 2-3 months.  Thanks, Barnie Alderman, RN, MSN, CDE Diabetes Coordinator Inpatient Diabetes Program [phone] (Team Pager from 8am to 5pm)

## 2020-01-10 NOTE — Progress Notes (Signed)
Pt stated that she does not have her cpap unit and did not want to wear ours. She said she would call if she changed her mind.

## 2020-01-10 NOTE — Progress Notes (Signed)
   01/10/20 1100  Clinical Encounter Type  Visited With Patient  Visit Type Initial  Referral From Chaplain  Consult/Referral To [place] responded to OR for prayer. Chaplain briefly visited with patient. Chaplain asked what she should pray for her. Patient said diabetes. After prayer was over patient said her procedure is on Monday. Chaplain asked what type of procedure and patient said the procedure going up through the vein to her heart to see if there is a brokerage. Chaplain prayed again specific regarding the procedure. Chaplain encouraged patient to be selfish at this time, concentrating on herself and getting well.

## 2020-01-10 NOTE — Progress Notes (Addendum)
Progress Note  Patient Name: Jamie Murray Date of Encounter: 01/10/2020  Primary Cardiologist: Humphrey Rolls  Subjective   Dyspnea improving. No chest pain, palpitations, dizziness, presyncope, or syncope. Renal function improving this morning. HGB low though stable.   Inpatient Medications    Scheduled Meds: . aspirin EC  81 mg Oral Daily  . atorvastatin  10 mg Oral q1800  . buPROPion  150 mg Oral BID  . busPIRone  7.5 mg Oral BID  . clopidogrel  75 mg Oral Daily  . enoxaparin (LOVENOX) injection  40 mg Subcutaneous Q24H  . ferrous [id number] C-folic acid  1 capsule Oral Daily  . insulin aspart  0-15 Units Subcutaneous TID WC  . insulin detemir  10 Units Subcutaneous QHS  . isosorbide mononitrate  30 mg Oral Daily  . levothyroxine  50 mcg Oral Q0600  . multivitamin with minerals  1 tablet Oral Daily  . zaleplon  10 mg Oral QHS   Continuous Infusions:  PRN Meds: acetaminophen **OR** acetaminophen, dextrose, LORazepam   Vital Signs    Vitals:   01/09/20 1800 01/09/20 2106 01/10/20 0525 01/10/20 0733  BP: 129/87 132/68 (!) 146/76 (!) 142/79  Pulse: 60 (!) 50 (!) 54 (!) 52  Resp:  16  16  Temp:  98.7 F (37.1 C) 98.5 F (36.9 C) 97.8 F (36.6 C)  TempSrc:  Oral Oral Oral  SpO2: 98% 100% 100% 100%  Weight:      Height:        Intake/Output Summary (Last 24 hours) at 01/10/2020 0912 Last data filed at 01/10/2020 0454 Gross per 24 hour  Intake 1562.31 ml  Output 0 ml  Net 1562.31 ml   Filed Weights   01/09/20 0555  Weight: 74.8 kg    Telemetry    SR - Personally Reviewed  ECG    No new tracings - Personally Reviewed  Physical Exam   GEN: No acute distress.   Neck: JVD elevated to the angle of the mandible. Cardiac: RRR, II/VI systolic murmur LSB, no rubs, or gallops.  Respiratory: Diminished breath sounds along the bilateral bases with faint crackles.  GI: Soft, nontender, non-distended.   MS: No edema; No deformity. Neuro:  Alert and  oriented x 3; Nonfocal.  Psych: Normal affect.  Labs    Chemistry Recent Labs  Lab 01/09/20 0554 01/09/20 1631 01/10/20 0429  NA 132* 137 137  K 4.3 3.8 4.3  CL 101 108 111  CO2 18* 20* 16*  GLUCOSE 792* 240* 362*  BUN 36* 29* 31*  CREATININE 2.03* 1.80* 1.66*  CALCIUM 8.8* 9.2 8.5*  PROT 6.8  --   --   ALBUMIN 3.7  --   --   AST 36  --   --   ALT 26  --   --   ALKPHOS 71  --   --   BILITOT 1.2  --   --   GFRNONAA 24* 27* 30*  GFRAA 27* 32* 35*  ANIONGAP _0 Hematology Recent Labs  Lab 01/09/20 0554 01/09/20 0952 01/10/20 0429  WBC 3.9* 3.9* 4.1  RBC 3.38* 3.41* 3.16*  HGB 10.2* 10.1* 9.6*  HCT 30.5* 31.4* 28.8*  MCV 90.2 92.1 91.1  MCH 30.2 29.6 30.4  MCHC 33.4 32.2 33.3  RDW 14.6 14.6 14.6  PLT 112* 110* 109*    Cardiac EnzymesNo results for input(s): TROPONINI in the last 168 hours. No results for input(s): TROPIPOC in the last 168 hours.  BNP Recent Labs  Lab 01/09/20 0632  BNP 2,387.1*     DDimer No results for input(s): DDIMER in the last 168 hours.   Radiology    DG Chest Portable 1 View  Result Date: 01/09/2020 IMPRESSION: 1. No acute finding. 2. Cardiomegaly. Electronically Signed   By: Monte Fantasia M.D.   On: 01/09/2020 06:10    Cardiac Studies   2D echo 01/09/2020: 1. Left ventricular ejection fraction, by estimation, is 55 to 60%. The  left ventricle has normal function. The left ventricle has no regional  wall motion abnormalities. There is mild left ventricular hypertrophy.  Left ventricular diastolic parameters  are consistent with Grade I diastolic dysfunction (impaired relaxation).  There is the interventricular septum is flattened in systole, consistent  with right ventricular pressure overload.  2. Right ventricular systolic function is moderately reduced. The right  ventricular size is moderately enlarged. Moderately increased right  ventricular wall thickness. There is severely elevated pulmonary artery    systolic pressure. The estimated right  ventricular systolic pressure is 16.1 mmHg.  3. Right atrial size was mildly dilated.  4. The mitral valve is normal in structure. No evidence of mitral valve  regurgitation. No evidence of mitral stenosis.  5. Tricuspid valve regurgitation is mild to moderate.  6. The aortic valve is normal in structure. Aortic valve regurgitation is  trivial. Mild aortic valve sclerosis is present, with no evidence of  aortic valve stenosis.  7. Pulmonic valve regurgitation is severe.  8. The inferior vena cava is dilated in size with <50% respiratory  variability, suggesting right atrial pressure of 15 mmHg.  Patient Profile     74 y.o. female who is followed by Dr. Humphrey Rolls, though we were asked to evaluate given he is on vacation with history of HFpEF, severe pulmonary hypertension, mild COPD, DM, CKD stage II, anemia of chronic disease, OSA, extensive secondhand smoking, HLD, hypothyroidism, and GERD who we are seeing for severe pulmonary hypertension with right-heart failure and elevated HS-Tn.   Assessment & Plan    1. Severe pulmonary hypertension with right-heart failure: -Initially, her pulmonary hypertension with mild to moderate, though echo in 2020 showed a PASP around 85 mmHg with echo this admission of 83.2 mmHg along with a moderately enlarged and moderate reduced RVSF, moderate TR and a dilated and nonreactive IVC -She has NYHA class IV symptoms with progression of right-heart failure  -No prior pulmonary hypertension workup available for review  -VQ scan to exclude possible chronic PE followed by [facility], likely on Monday, 8/9, pending renal function trend -Would be cautious with IV fluids with the above -If her renal function declines, she would at a minimum require a RHC followed by referral to a pulmonary hypertension center -CPAP while admitted   2. Elevated HS-Tn: -Mildly elevated high-sensitivity troponin and flat trending, not consistent  with ACS -Likely supply demand ischemia in the setting of severe pulmonary hypertension with acute on CKD and underlying anemia -Plan for [facility] prior to discharge as above, when able -She has several CAD risk factors including poorly controlled diabetes, HLD, and extensive secondhand smoking history -No indication for heparin gtt at this time -Plan for [facility], possibly 8/9 as above, pending continued improvement of renal function -It is uncertain why she was placed on Plavix at time of admission, this is not a PTA medication. Her daughter indicates this was discontinued as an outpatient 2 years prior, we will stop this medication  -Continue ASA at this time -Lipitor -Imdur -Metoprolol  held with bradycardia   3. Acute on CKD stage II: -Renal function improving -Avoid nephrotoxins   4. HTN: -Blood pressure suboptimally controlled in the 929G systolic currently -PTA metoprolol on hold with bradycardia  -Add hydralazine  -Imdur  5. DM: -Poorly controlled -A1c 10.9 this admission -Management per IM  6. Anemia of chronic disease: -Stable -Monitor   For questions or updates, please contact Jamie Murray Please consult [email or web address] for contact info under Cardiology/STEMI.    Signed, Christell Faith, PA-C [place] Pager: [phone] 01/10/2020, 9:12 AM

## 2020-01-10 NOTE — Evaluation (Signed)
Physical Therapy Evaluation Patient Details Name: Jamie Murray MRN: [id number] DOB: Jan 29, 1946 Today's Date: 01/10/2020   History of Present Illness  Pt is a 74 y.o. female with medical history significant for diabetes mellitus, COPD, depression, GERD, dyslipidemia and TIA. Per MD impression, pt presents w/ hyperglycemia due to DM, AKI on CKD stage IIIb, hypothyroidism, elevated troponin (likely demand ischemia), SOB w/ pulmonary hypotension and right-heart failure, and weakness.  Clinical Impression  Pt was pleasant and motivated to participate during the session. Pt mod-I w/ bed mobility and transfers and required supervision for ambulation and stairs. Pt was able to ambulate 197f w/ SPC with a slow gait speed and min-mod instability w/ occasional reaches for objects to stabilize herself. Pt able to ascend/descend 4x stairs w/ L rail in both directions and cane in R hand; slow speed and step-to pattern, but overall good eccentric control throughout. Pt's HR was noted to increase to 87bpm after ambulating 761fand 92bpm after 15019fPt admittedly stated that she feels weak and that she would like to improve her strength and walk better. However, pt was adamant that she does not need physical therapy at this time and refused HHPT as well as further therapy during her stay in acute care. Will complete PT orders at this time but will reassess pt pending a change in status upon receipt of new PT orders.      Follow Up Recommendations Home health PT;Supervision for mobility/OOB    Equipment Recommendations  None recommended by PT    Recommendations for Other Services       Precautions / Restrictions Precautions Precautions: Fall Restrictions Weight Bearing Restrictions: No      Mobility  Bed Mobility Overal bed mobility: Modified Independent             General bed mobility comments: slight inc time & effort  Transfers Overall transfer level: Independent                   Ambulation/Gait Ambulation/Gait assistance: Supervision Gait Distance (Feet): 150 Feet Assistive device: Straight cane Gait Pattern/deviations: Step-through pattern Gait velocity: decreased   General Gait Details: Pt able to walk with a slowed, step-through, gait patten w/ use of SPC. Pt appeared somewhat unsteady and used cane way out in front of her. Pt stated she could not walk this distance without the cane. HR increased to 87 halfway into walk and 92 after 150f12fStairs Stairs: Yes Stairs assistance: Supervision Stair Management: One rail Left;With cane;Step to pattern Number of Stairs: 4 General stair comments: Pt has bilateral rails at home; today used L rail w/ ascending and descending and cane in the R hand. Pt ambulated stairs w/ a slow, step-to pattern, and demonstrated good eccentric control throughout.   Wheelchair Mobility    Modified Rankin (Stroke Patients Only)       Balance Overall balance assessment: Mild deficits observed, not formally tested                                           Pertinent Vitals/Pain Pain Assessment: No/denies pain    Home Living Family/patient expects to be discharged to:: Private residence Living Arrangements: Spouse/significant other Available Help at Discharge: Family;Available PRN/intermittently Type of Home: House Home Access: Stairs to enter Entrance Stairs-Rails: Right;Left;Can reach both Entrance Stairs-Number of Steps: 4 Home Layout: One level Home Equipment: Cane - single point;Grab  bars - tub/shower      Prior Function Level of Independence: Independent with assistive device(s)         Comments: Pt reports able to walk community distances w/ SPC. Pt states typically does not use SPC in the house, but grabs onto furniture and walls. Pt denies any falls in the past year, but daughter adamant that she has fallen at least once; pt finally agrees and states fall due to LOB. Pt reports  independence w/ ADLs; husband in good health but she is not the type to ask for help.     Hand Dominance        Extremity/Trunk Assessment   Upper Extremity Assessment Upper Extremity Assessment: Overall WFL for tasks assessed    Lower Extremity Assessment Lower Extremity Assessment: Overall WFL for tasks assessed       Communication   Communication: No difficulties  Cognition Arousal/Alertness: Awake/alert Behavior During Therapy: WFL for tasks assessed/performed Overall Cognitive Status: Within Functional Limits for tasks assessed                                        General Comments      Exercises     Assessment/Plan    PT Assessment Patent does not need any further PT services  PT Problem List         PT Treatment Interventions      PT Goals (Current goals can be found in the Care Plan section)  Acute Rehab PT Goals PT Goal Formulation: All assessment and education complete, DC therapy    Frequency     Barriers to discharge        Co-evaluation               AM-PAC PT "6 Clicks" Mobility  Outcome Measure Help needed turning from your back to your side while in a flat bed without using bedrails?: None Help needed moving from lying on your back to sitting on the side of a flat bed without using bedrails?: None Help needed moving to and from a bed to a chair (including a wheelchair)?: None Help needed standing up from a chair using your arms (e.g., wheelchair or bedside chair)?: None Help needed to walk in hospital room?: None Help needed climbing 3-5 steps with a railing? : None 6 Click Score: 24    End of Session Equipment Utilized During Treatment: Gait belt Activity Tolerance: Patient tolerated treatment well Patient left: in bed;with bed alarm set;with call bell/phone within reach;with family/visitor present Nurse Communication: Mobility status      Time: 5956-3875 PT Time Calculation (min) (ACUTE ONLY): 23  min   Charges:              Herbert Moors SPT 01/10/20, 4:41 PM

## 2020-01-10 NOTE — Progress Notes (Signed)
Anticoagulation monitoring(Lovenox):  74 yo  female ordered Lovenox 30 mg Q24h  Filed Weights   01/09/20 0555  Weight: 74.8 kg (165 lb)   Body mass index is 25.84 kg/m.  Lab Results  Component Value Date   CREATININE 1.66 (H) 01/10/2020   CREATININE 1.80 (H) 01/09/2020   CREATININE 2.03 (H) 01/09/2020   Estimated Creatinine Clearance: 31.9 mL/min (A) (by C-G formula based on SCr of 1.66 mg/dL (H)). Hemoglobin & Hematocrit     Component Value Date/Time   HGB 9.6 (L) 01/10/2020 0429   HGB 10.4 (L) 05/03/2016 0957   HCT 28.8 (L) 01/10/2020 0429   HCT 32.4 (L) 05/03/2016 0957   SCr 1.80>1.66  Per Protocol for Patient with estCrcl > 30 ml/min and BMI < 40, will transition to Lovenox 40 mg Q24h.

## 2020-01-11 DIAGNOSIS — H43391 Other vitreous opacities, right eye: Secondary | ICD-10-CM

## 2020-01-11 LAB — GLUCOSE, CAPILLARY
Glucose-Capillary: 157 mg/dL — ABNORMAL HIGH (ref 70–99)
Glucose-Capillary: 192 mg/dL — ABNORMAL HIGH (ref 70–99)
Glucose-Capillary: 213 mg/dL — ABNORMAL HIGH (ref 70–99)
Glucose-Capillary: 245 mg/dL — ABNORMAL HIGH (ref 70–99)

## 2020-01-11 LAB — BASIC METABOLIC PANEL
Anion gap: 12 (ref 5–15)
BUN: 27 mg/dL — ABNORMAL HIGH (ref 8–23)
CO2: 19 mmol/L — ABNORMAL LOW (ref 22–32)
Calcium: 9.3 mg/dL (ref 8.9–10.3)
Chloride: 107 mmol/L (ref 98–111)
Creatinine, Ser: 1.66 mg/dL — ABNORMAL HIGH (ref 0.44–1.00)
GFR calc Af Amer: 35 mL/min — ABNORMAL LOW (ref >60)
GFR calc non Af Amer: 30 mL/min — ABNORMAL LOW (ref >60)
Glucose, Bld: 236 mg/dL — ABNORMAL HIGH (ref 70–99)
Potassium: 3.4 mmol/L — ABNORMAL LOW (ref 3.5–5.1)
Sodium: 138 mmol/L (ref 135–145)

## 2020-01-11 LAB — SEDIMENTATION RATE: Sed Rate: 67 mm/hr — ABNORMAL HIGH (ref 0–30)

## 2020-01-11 MED ORDER — INSULIN DETEMIR 100 UNIT/ML ~~LOC~~ SOLN
18.0000 [IU] | Freq: Every day | SUBCUTANEOUS | Status: DC
  Administered 2020-01-11 – 2020-01-12 (×2): 18 [IU] via SUBCUTANEOUS
  Filled 2020-01-11 (×3): qty 0.18

## 2020-01-11 MED ORDER — HYDRALAZINE HCL 50 MG PO TABS
50.0000 mg | ORAL_TABLET | Freq: Three times a day (TID) | ORAL | Status: DC
  Administered 2020-01-11 – 2020-01-13 (×6): 50 mg via ORAL
  Filled 2020-01-11 (×6): qty 1

## 2020-01-11 MED ORDER — HYDRALAZINE HCL 20 MG/ML IJ SOLN: 10.0000 mg | Freq: Once | INTRAMUSCULAR | Status: DC

## 2020-01-11 MED ORDER — AMLODIPINE BESYLATE 5 MG PO TABS
5.0000 mg | ORAL_TABLET | Freq: Every day | ORAL | Status: DC
  Administered 2020-01-11 – 2020-01-13 (×3): 5 mg via ORAL
  Filled 2020-01-11 (×3): qty 1

## 2020-01-11 NOTE — Progress Notes (Signed)
Np notified of elevated bp and pt's complaint of seeing floating specks of light from R eye. No other symptoms reported. Prn hyrdalazine ordered.

## 2020-01-11 NOTE — Progress Notes (Signed)
Patient ID: Jamie Murray, female   DOB: [birth year], 74 y.o.   MRN: [id number] [facility] PROGRESS NOTE  Martin Smeal LXB:[id number] DOB: 06-24-1945 DOA: 01/09/2020 PCP: Perrin Maltese, MD  HPI/Subjective: Patient came in with shortness of breath.  Her main complaint today was seeing floaters in her right eye.  They last maybe 30 to 40 seconds at a time.  Shaped like Rubik's cube and moving it clockwise motion.  Patient's breathing is a little bit better today.  Objective: Vitals:   01/11/20 0735 01/11/20 1147  BP: (!) 156/85 (!) 152/90  Pulse: 68 80  Resp: 19 18  Temp: 98 F (36.7 C) 98.1 F (36.7 C)  SpO2: 99% 97%    Intake/Output Summary (Last 24 hours) at 01/11/2020 1441 Last data filed at 01/11/2020 1359 Gross per 24 hour  Intake 720 ml  Output 0 ml  Net 720 ml   Filed Weights   01/09/20 0555 01/11/20 0453  Weight: 74.8 kg 68.8 kg    ROS: Review of Systems  Respiratory: Positive for shortness of breath.   Cardiovascular: Negative for chest pain.  Gastrointestinal: Negative for abdominal pain.  Neurological: Positive for headaches.   Exam: Physical Exam HENT:     Nose: No mucosal edema.     Mouth/Throat:     Pharynx: No oropharyngeal exudate.  Eyes:     General: Lids are normal.     Conjunctiva/sclera: Conjunctivae normal.     Pupils: Pupils are equal, round, and reactive to light.  Cardiovascular:     Rate and Rhythm: Normal rate and regular rhythm.     Heart sounds: Normal heart sounds, S1 normal and S2 normal.  Pulmonary:     Breath sounds: No decreased breath sounds, wheezing, rhonchi or rales.  Abdominal:     Palpations: Abdomen is soft.     Tenderness: There is no abdominal tenderness.  Musculoskeletal:     Right lower leg: No swelling.     Left lower leg: No swelling.  Skin:    General: Skin is warm.     Findings: No rash.  Neurological:     Mental Status: She is alert and oriented to person, place, and time.       Data  Reviewed: Basic Metabolic Panel: Recent Labs  Lab 01/09/20 0554 01/09/20 1631 01/10/20 0429 01/11/20 0908  NA 132* 137 137 138  K 4.3 3.8 4.3 3.4*  CL 101 108 111 107  CO2 18* 20* 16* 19*  GLUCOSE 792* 240* 362* 236*  BUN 36* 29* 31* 27*  CREATININE 2.03* 1.80* 1.66* 1.66*  CALCIUM 8.8* 9.2 8.5* 9.3  MG 2.5*  --   --   --    Liver Function Tests: Recent Labs  Lab 01/09/20 0554  AST 36  ALT 26  ALKPHOS 71  BILITOT 1.2  PROT 6.8  ALBUMIN 3.7   CBC: Recent Labs  Lab 01/09/20 0554 01/09/20 0952 01/10/20 0429  WBC 3.9* 3.9* 4.1  NEUTROABS 2.5  --   --   HGB 10.2* 10.1* 9.6*  HCT 30.5* 31.4* 28.8*  MCV 90.2 92.1 91.1  PLT 112* 110* 109*   BNP (last 3 results) Recent Labs    01/09/20 0632  BNP 2,387.1*     CBG: Recent Labs  Lab 01/10/20 0735 01/10/20 1121 01/10/20 1800 01/11/20 0734 01/11/20 1146  GLUCAP 290* 303* 342* 157* 192*    Recent Results (from the past 240 hour(s))  SARS Coronavirus 2 by RT PCR (hospital order, performed  in [place] lab) Nasopharyngeal Nasopharyngeal Swab     Status: None   Collection Time: 01/09/20  6:32 AM   Specimen: Nasopharyngeal Swab  Result Value Ref Range Status   SARS Coronavirus 2 NEGATIVE NEGATIVE Final    Comment: (NOTE) SARS-CoV-2 target nucleic acids are NOT DETECTED.  The SARS-CoV-2 RNA is generally detectable in upper and lower respiratory specimens during the acute phase of infection. The lowest concentration of SARS-CoV-2 viral copies this assay can detect is 250 copies / mL. A negative result does not preclude SARS-CoV-2 infection and should not be used as the sole basis for treatment or other patient management decisions.  A negative result may occur with improper specimen collection / handling, submission of specimen other than nasopharyngeal swab, presence of viral mutation(s) within the areas targeted by this assay, and inadequate number of viral copies (<250 copies / mL). A negative  result must be combined with clinical observations, patient history, and epidemiological information.  Fact Sheet for Patients:   StrictlyIdeas.no  Fact Sheet for Healthcare Providers: BankingDealers.co.za  This test is not yet approved or  cleared by the [place] FDA and has been authorized for detection and/or diagnosis of SARS-CoV-2 by FDA under an Emergency Use Authorization (EUA).  This EUA will remain in effect (meaning this test can be used) for the duration of the COVID-19 declaration under Section 564(b)(1) of the Act, 21 U.S.C. section 360bbb-3(b)(1), unless the authorization is terminated or revoked sooner.  Performed at [facility], [address] [place], [place] [address]      Studies: DG Chest 1 View  Result Date: 01/10/2020 CLINICAL DATA:  Hyperglycemia, shortness of breath EXAM: CHEST  1 VIEW COMPARISON:  01/09/2020 chest radiograph. FINDINGS: Cardiomegaly, unchanged. Aortic atherosclerotic calcifications. Minimal bibasilar atelectasis. No pneumothorax or pleural effusion. Osseous structures are unchanged. IMPRESSION: Minimal bibasilar atelectasis. Cardiomegaly, unchanged. Electronically Signed   By: Primitivo Gauze M.D.   On: 01/10/2020 11:20   NM Pulmonary Perfusion  Result Date: 01/10/2020 CLINICAL DATA:  74 year old female with chest pain and shortness of breath x1 month. COPD. EXAM: NUCLEAR MEDICINE PERFUSION LUNG SCAN TECHNIQUE: Perfusion images were obtained in multiple projections after intravenous injection of radiopharmaceutical. Ventilation scans intentionally deferred if perfusion scan and chest x-ray adequate for interpretation during COVID 19 epidemic. RADIOPHARMACEUTICALS:  4.9 mCi Tc-108mMAA IV COMPARISON:  Portable chest 1108 hours today. FINDINGS: Homogeneous perfusion radiotracer activity throughout both lungs. Cardiomegaly related mediastinal photopenia. No perfusion defect identified.  IMPRESSION: Homogeneous perfusion.  No evidence of pulmonary embolus. Electronically Signed   By: HGenevie AnnM.D.   On: 01/10/2020 14:30    Scheduled Meds: . amLODipine  5 mg Oral Daily  . aspirin EC  81 mg Oral Daily  . atorvastatin  10 mg Oral q1800  . enoxaparin (LOVENOX) injection  40 mg Subcutaneous Q24H  . ferrous [id number]-folic acid  1 capsule Oral Daily  . hydrALAZINE  10 mg Intravenous Once  . hydrALAZINE  50 mg Oral Q8H  . insulin aspart  0-15 Units Subcutaneous TID WC  . insulin aspart  0-5 Units Subcutaneous QHS  . insulin aspart  3 Units Subcutaneous TID WC  . insulin detemir  15 Units Subcutaneous QHS  . isosorbide mononitrate  30 mg Oral Daily  . levothyroxine  75 mcg Oral Q0600  . multivitamin with minerals  1 tablet Oral Daily  . Vilazodone HCl  20 mg Oral Daily  . zaleplon  10 mg Oral QHS  Assessment/Plan:  1. Shortness of breath with pulmonary hypertension.  VQ scan negative.  Cardiology planning on a right and left heart catheterization on Monday. 2. Right Eye floaters shaped like a Rubik's cube moving in a clockwise rotation.  Case discussed with ophthalmology and they would like to follow the patient up as outpatient in the office and mentioned that we can potentially get an MRI of the brain.  Family wanted to hold off on MRI at this point.  Case discussed with neurology and they will see in consultation.  I sent off a sedimentation rate that was elevated at 67.  Usually with temporal arteritis is above 100.  Patient not having any visual loss.  Vision 20/30 without glasses right eye.  Family hesitant on empiric steroid at this point.  I advised that if she starts having any other visual issues let us know and then empiric steroids can be started. 3. Type 2 diabetes mellitus with hyperglycemia and hyperlipidemia.  Hemoglobin A1c elevated at 10.9.  Will titrate Levemir up to 18 units at night 4. Acute kidney injury on chronic kidney disease stage IIIb.   Creatinine stable at 1.66.  (Recent creatinine 10/21/2019 1.57) 5. Hypothyroidism unspecified on levothyroxine 6. Anxiety depression continue psychiatric medications 7. Weakness.  Physical therapy evaluation 8. Sleep apnea CPAP at night 9. Essential hypertension on hydralazine, Norvasc    Code Status:     Code Status Orders  (From admission, onward)         Start     Ordered   01/09/20 0913  Full code  Continuous        01/09/20 0915        Code Status History    Date Active Date Inactive Code Status Order ID Comments User Context   08/09/2016 2359 08/11/2016 1556 Full Code [id number]  Harvie Bridge, DO Inpatient   12/22/2015 0816 12/24/2015 1352 Full Code [id number]  Demetrios Loll, MD Inpatient   Advance Care Planning Activity     Family Communication: Family at the bedside and on the phone Disposition Plan: Status is: Inpatient  Dispo: The patient is from: Home              Anticipated d/c is to: Home              Anticipated d/c date is: Earliest potential will be 01/14/2020              Patient currently being evaluated for pulmonary hypertension and shortness of breath.  Consultants:  Cardiology  Time spent: 27 minutes  [place]

## 2020-01-11 NOTE — Progress Notes (Signed)
Progress Note  Patient Name: Jamie Murray Date of Encounter: 01/11/2020  [facility] Cardiologist: No primary care provider on file.   Subjective   Feeling well.  Breathing improved.  No chest pain. Her abdomen does not feel bloated.  Inpatient Medications    Scheduled Meds: . aspirin EC  81 mg Oral Daily  . atorvastatin  10 mg Oral q1800  . enoxaparin (LOVENOX) injection  40 mg Subcutaneous Q24H  . ferrous [id number] C-folic acid  1 capsule Oral Daily  . hydrALAZINE  10 mg Intravenous Once  . hydrALAZINE  25 mg Oral Q8H  . insulin aspart  0-15 Units Subcutaneous TID WC  . insulin aspart  0-5 Units Subcutaneous QHS  . insulin aspart  3 Units Subcutaneous TID WC  . insulin detemir  15 Units Subcutaneous QHS  . isosorbide mononitrate  30 mg Oral Daily  . levothyroxine  75 mcg Oral Q0600  . multivitamin with minerals  1 tablet Oral Daily  . Vilazodone HCl  20 mg Oral Daily  . zaleplon  10 mg Oral QHS   Continuous Infusions:  PRN Meds: acetaminophen **OR** acetaminophen, dextrose, LORazepam   Vital Signs    Vitals:   01/10/20 1940 01/11/20 0453 01/11/20 0735 01/11/20 1147  BP: (!) 160/84 (!) 183/91 (!) 156/85 (!) 152/90  Pulse: 69 70 68 80  Resp: _0 Temp: 98.4 F (36.9 C) 98.3 F (36.8 C) 98 F (36.7 C) 98.1 F (36.7 C)  TempSrc: Oral Oral Oral   SpO2: 100% 100% 99% 97%  Weight:  68.8 kg    Height:        Intake/Output Summary (Last 24 hours) at 01/11/2020 1335 Last data filed at 01/11/2020 1009 Gross per 24 hour  Intake 960 ml  Output 0 ml  Net 960 ml   Last 3 Weights 01/11/2020 01/09/2020 04/16/2018  Weight (lbs) 151 lb 9.6 oz 165 lb 165 lb  Weight (kg) 68.765 kg 74.844 kg 74.844 kg      Telemetry    Sinus rhythm - Personally Reviewed  ECG    01/09/2020: Sinus arrhythmia.  First-degree heart block.  Right bundle branch block.   - Personally Reviewed  Physical Exam   VS:  BP (!) 152/90 (BP Location: Right Arm)   Pulse 80    Temp 98.1 F (36.7 C)   Resp 18   Ht _1  (1.702 m)   Wt 68.8 kg   LMP  (LMP Unknown)   SpO2 97%   BMI 23.74 kg/m  , BMI Body mass index is 23.74 kg/m. GENERAL:  Well appearing HEENT: Pupils equal round and reactive, fundi not visualized, oral mucosa unremarkable NECK:  JVP to ear at 45 degrees, waveform within normal limits, carotid upstroke brisk and symmetric, no bruits, no thyromegaly LUNGS:  Clear to auscultation bilaterally HEART:  RRR.  PMI not displaced or sustained,S1 and S2 within normal limits, no S3, no S4, no clicks, no rubs, II/VI systolic murmur ABD:  Flat, positive bowel sounds normal in frequency in pitch, no bruits, no rebound, no guarding, no midline pulsatile mass, no hepatomegaly, no splenomegaly EXT:  2 plus pulses throughout, no edema, no cyanosis no clubbing SKIN:  No rashes no nodules NEURO:  Cranial nerves II through XII grossly intact, motor grossly intact throughout PSYCH:  Cognitively intact, oriented to person place and time   Labs    High Sensitivity Troponin:   Recent Labs  Lab 01/09/20 0554 01/09/20 0747 01/09/20 0952 01/09/20  1116  TROPONINIHS 84* 93* 90* 77*      Chemistry Recent Labs  Lab 01/09/20 0554 01/09/20 0554 01/09/20 1631 01/10/20 0429 01/11/20 0908  NA 132*   < > 137 137 138  K 4.3   < > 3.8 4.3 3.4*  CL 101   < > 108 111 107  CO2 18*   < > 20* 16* 19*  GLUCOSE 792*   < > 240* 362* 236*  BUN 36*   < > 29* 31* 27*  CREATININE 2.03*   < > 1.80* 1.66* 1.66*  CALCIUM 8.8*   < > 9.2 8.5* 9.3  PROT 6.8  --   --   --   --   ALBUMIN 3.7  --   --   --   --   AST 36  --   --   --   --   ALT 26  --   --   --   --   ALKPHOS 71  --   --   --   --   BILITOT 1.2  --   --   --   --   GFRNONAA 24*   < > 27* 30* 30*  GFRAA 27*   < > 32* 35* 35*  ANIONGAP 13   < > _0 < > = values in this interval not displayed.     Hematology Recent Labs  Lab 01/09/20 0554 01/09/20 0952 01/10/20 0429  WBC 3.9* 3.9* 4.1  RBC  3.38* 3.41* 3.16*  HGB 10.2* 10.1* 9.6*  HCT 30.5* 31.4* 28.8*  MCV 90.2 92.1 91.1  MCH 30.2 29.6 30.4  MCHC 33.4 32.2 33.3  RDW 14.6 14.6 14.6  PLT 112* 110* 109*    BNP Recent Labs  Lab 01/09/20 0632  BNP 2,387.1*     DDimer No results for input(s): DDIMER in the last 168 hours.   Radiology    DG Chest 1 View  Result Date: 01/10/2020 CLINICAL DATA:  Hyperglycemia, shortness of breath EXAM: CHEST  1 VIEW COMPARISON:  01/09/2020 chest radiograph. FINDINGS: Cardiomegaly, unchanged. Aortic atherosclerotic calcifications. Minimal bibasilar atelectasis. No pneumothorax or pleural effusion. Osseous structures are unchanged. IMPRESSION: Minimal bibasilar atelectasis. Cardiomegaly, unchanged. Electronically Signed   By: Primitivo Gauze M.D.   On: 01/10/2020 11:20   NM Pulmonary Perfusion  Result Date: 01/10/2020 CLINICAL DATA:  74 year old female with chest pain and shortness of breath x1 month. COPD. EXAM: NUCLEAR MEDICINE PERFUSION LUNG SCAN TECHNIQUE: Perfusion images were obtained in multiple projections after intravenous injection of radiopharmaceutical. Ventilation scans intentionally deferred if perfusion scan and chest x-ray adequate for interpretation during COVID 19 epidemic. RADIOPHARMACEUTICALS:  4.9 mCi Tc-35mMAA IV COMPARISON:  Portable chest 1108 hours today. FINDINGS: Homogeneous perfusion radiotracer activity throughout both lungs. Cardiomegaly related mediastinal photopenia. No perfusion defect identified. IMPRESSION: Homogeneous perfusion.  No evidence of pulmonary embolus. Electronically Signed   By: HGenevie AnnM.D.   On: 01/10/2020 14:30    Cardiac Studies   Echo 01/09/20: 1. Left ventricular ejection fraction, by estimation, is 55 to 60%. The  left ventricle has normal function. The left ventricle has no regional  wall motion abnormalities. There is mild left ventricular hypertrophy.  Left ventricular diastolic parameters  are consistent with Grade I diastolic  dysfunction (impaired relaxation).  There is the interventricular septum is flattened in systole, consistent  with right ventricular pressure overload.  2. Right ventricular systolic function is moderately reduced. The right  ventricular size is  moderately enlarged. Moderately increased right  ventricular wall thickness. There is severely elevated pulmonary artery  systolic pressure. The estimated right  ventricular systolic pressure is 16.1 mmHg.  3. Right atrial size was mildly dilated.  4. The mitral valve is normal in structure. No evidence of mitral valve  regurgitation. No evidence of mitral stenosis.  5. Tricuspid valve regurgitation is mild to moderate.  6. The aortic valve is normal in structure. Aortic valve regurgitation is  trivial. Mild aortic valve sclerosis is present, with no evidence of  aortic valve stenosis.  7. Pulmonic valve regurgitation is severe.  8. The inferior vena cava is dilated in size with <50% respiratory  variability, suggesting right atrial pressure of 15 mmHg.    PFTs 11/08/16: Moderate Restrictive Airways Disease with Significant BD response  Probable obstructive small airways disease  Recommend smoking cessation  PFTs 04/2016: Normal  Patient Profile     74 y.o. female with severe help pulmonary hypertension, hypertension, diabetes, prior stroke, and OSA on CPAP admitted with acute on chronic right-sided heart failure and pulmonary hypertension.  Assessment & Plan    # Severe pulmonary hypertension:  # Acute on chronic right sided heart failure: # OSA: The cause of her severe pulmonary hypertension is likely multifactorial.  This has been ongoing since at least 2017.  Her PFTs are difficult to decipher.  She had PFTs in 2017 that were reportedly normal.  However 6 months later she had moderate restrictive lung disease.  She has never smoked.  She has had significant secondhand smoke.  She also does have OSA and struggle somewhat with  the use of her CPAP.  ESR is pending to evaluate for rheumatologic causes.  V/Q scan was negative for CTEPH.  She is currently breathing comfortably on room air.  She does not appear very volume overloaded on exam.  However her IVC was dilated on echo.  She was given IV fluids on admission which have subsequently been discontinued.  Her renal function has improved with this.  Given that she is comfortable we will wait for right heart cath on Monday before making any changes to her volume status.  She will likely benefit from evaluation by Dr. Haroldine Laws on [facility] for further work-up and management of her pulmonary hypertension.  We will need to better define her underlying lung disease before considering her for pulmonary vasodilators.  Repeat PFTs either inpatient or as an outpatient.  There is no significant scarring on her chest X-ray or prior chest CT.  # Essential hypertension: Blood pressure poorly-controlled.  At home she takes metoprolol, losartan, furosemide, hydralazine and spironolactone.  Her home losartan and spironolactone are on hold due to her renal dysfunction.  We will increase the hydralazine and add amlodipine.  She is seeing floaters in her vision which I suspect is due to her elevated blood pressure.  Neurology is following.  # Prior stroke:  Blood pressure control as above.  Continue aspirin and atorvastatin.      For questions or updates, please contact [place] Please consult [email or web address] for contact info under        Signed, Skeet Latch, MD  01/11/2020, 1:35 PM

## 2020-01-11 NOTE — Consult Note (Addendum)
NEUROLOGY CONSULTATION NOTE   Date of service: January 11, 2020 Patient Name: Jamie Murray MRN:  [id number] DOB:  Jul 19, 1945 Reason for consult: "headache"  History of Present Illness  Jamie Murray is a 74 y.o. female with PMH significant for COPD, DM2 for 40 years, HTN, HLD, Migraine, insomnia, osteoporosis, vertigo who is admitted with shortness of breath with pulmonary hypertension.  Patient endorsed that Thursday morning she woke up with her CPAP malfunctioning and she had blurred vision, off and on floaters in her right thigh on the right side and shortness of breath.  She presented to the ER for further work-up and evaluation.  She endorses of the floaters only present in the right eye and she does not see these floaters in her left eye.  The floaters are present whether she opens or closes her eye.  They are shaped like a Rubik cube and they appear to be rotating hand change color from red to blue.  She endorses history of migraine but she has never had a migraine with visual aura in the past 74 years of her life.  She reports a regular headache when she came in on Thursday but that this is gone now.  She denies any unilateral weakness or numbness.  She endorses some tingling in her fingers but that she reports has been chronic and ongoing.  She denies any facial numbness, no nausea, no vomiting, no photophobia, no phonophobia, no dizziness, no lightheadedness, no vertigo.  She notes that she has been diagnosed with TIAs in the past but no strokes.  She endorses that several years ago she had blurred vision in her right eye for which she was seen by ophthalmology and she had a laser surgery done and was told that she has eye disease due to her diabetes.  On my exam she was noted to have bilateral hip flexion weakness but she reports that this has been chronic and has been going on for the last 4 years at least.  She does not walk a lot and pretty much leads a sedentary  lifestyle.  She uses cane with walking.  She denies any bowel or bladder incontinence, no saddle anesthesia, no Lhermitte sign.  ROS   Constitutional Denies weight loss, fever and chills.  HEENT Denies blurred vision BL and hearing.  Respiratory Denies SOB and cough.  CV Denies palpitations and CP  GI Denies abdominal pain, nausea, vomiting and diarrhea.  GU Denies dysuria and urinary frequency.  MSK Denies myalgia and joint pain.  Skin Denies rash and pruritus.  Neurological headache present earlier during admission but now resolved, no syncope.  Psychiatric Denies recent changes in mood. Denies anxiety and depression.   Past History   Past Medical History:  Diagnosis Date   Allergy    Anemia    Anxiety    COPD (chronic obstructive pulmonary disease) (HCC)    Depression    Diabetes (HCC)    GERD (gastroesophageal reflux disease)    Hyperlipidemia    Hypertension    Insomnia    Migraines    Osteoporosis    [place] spotted fever    Transient cerebral ischemia    Vertigo    every 2-3 months   Past Surgical History:  Procedure Laterality Date   ABDOMINAL HYSTERECTOMY     CORONARY ANGIOPLASTY     ESOPHAGOGASTRODUODENOSCOPY (EGD) WITH PROPOFOL N/A 08/24/2015   Procedure: ESOPHAGOGASTRODUODENOSCOPY (EGD) WITH PROPOFOL with dialation;  Surgeon: Lucilla Lame, MD;  Location: [place];  Service: Endoscopy;  Laterality: N/A;  Diabetic - oral meds   TUBAL LIGATION     Family History  Problem Relation Age of Onset   Diabetes Mother    Hypertension Mother    Stroke Mother    Heart disease Mother        MI   Cancer Father        pancreatic   Stroke Father    Hypertension Sister    Diabetes Brother    Diabetes Maternal Grandfather    Diabetes Sister    Diabetes Sister    Diabetes Brother    Diabetes Brother    Social History   Socioeconomic History   Marital status: Married    Spouse name: Jimmy   Number of children:  3   Years of education: 14   Highest education level: Not on file  Occupational History    Comment: work part time, day care  Tobacco Use   Smoking status: Never Smoker   Smokeless tobacco: Never Used  Scientific laboratory technician Use: Never used  Substance and Sexual Activity   Alcohol use: No    Alcohol/week: 0.0 standard drinks   Drug use: No   Sexual activity: Never  Other Topics Concern   Not on file  Social History Narrative   Lives with husband   Caffeine use- coffee 2 cups daily, Coke once a week, green tea daily   Social Determinants of Health   Financial Resource Strain:    Difficulty of Paying Living Expenses:   Food Insecurity:    Worried About Charity fundraiser in the Last Year:    Arboriculturist in the Last Year:   Transportation Needs:    Film/video editor (Medical):    Lack of Transportation (Non-Medical):   Physical Activity:    Days of Exercise per Week:    Minutes of Exercise per Session:   Stress:    Feeling of Stress :   Social Connections:    Frequency of Communication with Friends and Family:    Frequency of Social Gatherings with Friends and Family:    Attends Religious Services:    Active Member of Clubs or Organizations:    Attends Music therapist:    Marital Status:    Allergies  Allergen Reactions   Prozac [Fluoxetine Hcl] Other (See Comments)    "felt crazy"    Medications   Medications Prior to Admission  Medication Sig Dispense Refill Last Dose   albuterol (VENTOLIN HFA) 108 (90 Base) MCG/ACT inhaler Inhale 2 puffs into the lungs every 6 (six) hours as needed for wheezing or shortness of breath.   PRN at PRN   aspirin 81 MG tablet Take 81 mg by mouth daily.   01/08/2020 at Unknown time   atorvastatin (LIPITOR) 10 MG tablet Take 1 tablet (10 mg total) by mouth daily. 90 tablet 3 01/08/2020 at Unknown time   FARXIGA 10 MG TABS tablet Take 10 mg by mouth daily.   01/08/2020 at Unknown time   Fe  Fum-FA-B Cmp-C-Zn-Mg-Mn-Cu (HEMOCYTE PLUS) 106-1 MG CAPS Take 1 capsule by mouth daily.  6 01/08/2020 at Unknown time   furosemide (LASIX) 20 MG tablet Take 20 mg by mouth daily as needed.  2 01/08/2020 at Unknown time   hydrALAZINE (APRESOLINE) 25 MG tablet TAKE 4 TABLETS BY MOUTH EVERY MORNING & TAKE 4 TABLETS BY MOUTH EVERY EVENING  3 01/08/2020 at Unknown time   levothyroxine (SYNTHROID) 75 MCG tablet  Take 75 mcg by mouth daily before breakfast.   01/08/2020 at Unknown time   loratadine (CLARITIN) 10 MG tablet Take 10 mg by mouth daily.   01/08/2020 at Unknown time   losartan (COZAAR) 100 MG tablet Take 100 mg by mouth daily.  6 01/08/2020 at Unknown time   meclizine (ANTIVERT) 25 MG tablet Take 25 mg by mouth 3 (three) times daily as needed for dizziness.   Past Week at PRN   metoprolol succinate (TOPROL-XL) 50 MG 24 hr tablet Take 50 mg by mouth daily. Take with or immediately following a meal.   01/08/2020 at Unknown time   Multiple Vitamins-Minerals (MULTI ADULT GUMMIES) CHEW Chew by mouth daily.   01/08/2020 at Unknown time   spironolactone (ALDACTONE) 50 MG tablet Take 50 mg by mouth daily.   01/08/2020 at Unknown time   VIIBRYD 20 MG TABS Take 1 tablet by mouth daily.   01/08/2020 at Unknown time   zaleplon (SONATA) 10 MG capsule Take 10 mg by mouth at bedtime.   01/08/2020 at Unknown time   Dulaglutide (TRULICITY) 1.5 KM/6.2MM SOPN Inject 1.5 mg into the skin once a week. 12 pen 3 Past Week   escitalopram (LEXAPRO) 20 MG tablet Take 20 mg by mouth daily.   3 12/16/2019   metFORMIN (GLUCOPHAGE) 500 MG tablet Take 1,000 mg by mouth 2 (two) times daily.   2 Past Week     Vitals  Temp:  [98 F (36.7 C)-98.4 F (36.9 C)] 98.1 F (36.7 C) (08/07 1147) Pulse Rate:  [53-80] 80 (08/07 1147) Resp:  [16-20] 18 (08/07 1147) BP: (137-183)/(78-91) 152/90 (08/07 1147) SpO2:  [97 %-100 %] 97 % (08/07 1147) Weight:  [68.8 kg] 68.8 kg (08/07 0453)  Body mass index is 23.74 kg/m.  Physical Exam    General: Laying comfortably in bed; in no acute distress.  HENT: Normal oropharynx and mucosa. Normal external appearance of ears and nose. Neck: Supple, no pain or tenderness CV: No JVD. No peripheral edema. Pulmonary: Symmetric Chest rise. Normal respiratory effort. Abdomen: Soft to touch, non-tender Ext: No cyanosis, edema, or deformity  Skin: No rash. Normal palpation of skin.   Musculoskeletal: Normal digits and nails by inspection. No clubbing.  Neurologic Examination  Mental status/Cognition: Alert, oriented to self, place, month and year, good attention. Speech/language: Fluent, comprehension intact, object naming intact, repetition intact. Cranial nerves:   CN II Pupils equal and reactive to light, no VF deficits, able to read letters on my badge.   CN III,IV,VI EOM intact, no gaze preference or deviation, no nystagmus   CN V normal sensation in V1, V2, and V3 segments bilaterally   CN VII no asymmetry, no nasolabial fold flattening   CN VIII normal hearing to speech   CN IX & X normal palatal elevation, no uvular deviation   CN XI 5/5 head turn and 5/5 shoulder shrug bilaterally   CN XII midline tongue protrusion   Motor:  Muscle bulk: normal, tone normal, pronator drift none Mvmt Root Nerve  Muscle Right Left Comments  SA C5/6 Ax Deltoid 5 5   EF C5/6 Mc Biceps 5 5   EE C6/7/8 Rad Triceps 5 5   WF C6/7 Med FCR 5 5   WE C7/8 PIN ECU 5 5   F Ab C8/T1 U ADM/FDI 5 5   HF L1/2/3 Fem Illopsoas 4 4   KE L2/3/4 Fem Quad 5 5   DF L4/5 D Peron Tib Ant 5 5   PF  S1/2 Tibial Grc/Sol 5 5    Reflexes:  Right Left Comments  Pectoralis      Biceps (C5/6)     Brachioradialis (C5/6) 2 2    Triceps (C6/7) 2 2    Patellar (L3/4) 2 2    Achilles (S1) 0 0    Hoffman      Plantar mute mute   Jaw jerk    Sensation:  Light touch    Pin prick    Temperature    Vibration   Proprioception    Coordination/Complex Motor:  - Finger to Nose intact - Heel to shin intact -  Rapid alternating movement intact - Gait: Deferred Labs   Lab Results  Component Value Date   NA 138 01/11/2020   K 3.4 (L) 01/11/2020   CL 107 01/11/2020   CO2 19 (L) 01/11/2020   GLUCOSE 236 (H) 01/11/2020   BUN 27 (H) 01/11/2020   CREATININE 1.66 (H) 01/11/2020   CALCIUM 9.3 01/11/2020   ALBUMIN 3.7 01/09/2020   AST 36 01/09/2020   ALT 26 01/09/2020   ALKPHOS 71 01/09/2020   BILITOT 1.2 01/09/2020   GFRNONAA 30 (L) 01/11/2020   GFRAA 35 (L) 01/11/2020     Imaging and Diagnostic studies  MRI brain without contrast on 04/16/2018: No acute finding or change from 2017.  Negative for acute infarct.     Impression   Jamie Murray is a 74 y.o. female with PMH significant for COPD, DM2 for 40 years, HTN, HLD, Migraine, insomnia, osteoporosis, vertigo who is admitted with shortness of breath with pulmonary hypertension.  Patient endorsed that Thursday morning she woke up with her CPAP malfunctioning and she had blurred vision, off and on floaters in her right eye only in the R inferior temporal quadrant and shortness of breath.  She presented to the ER for further work-up and evaluation. . Her neurologic examination is notable for bilateral lower extremity weakness.  She has never had floaters with her migraine headaches in the past and it will be very atypical for these floaters to be a migraine phenomena.  The floaters are only present in her right eye and the right inferior temporal quadrant.  She cannot appreciate any floaters in her left eye.  The fact that there mono-ocular, points to this localizing anterior to the optic chiasm and less likely to be cerebral in origin.  She also appears to have intact executive function at this time with no focal deficit, appropriate wakefulness and alertness which makes me less likely to believe that there is generalized cerebral dysfunction or potential PRES, also here Blood pressure was not significantly elevated to be causing  PRES.  She endorses history of requiring urgent laser eye surgery several years ago for potential retinal detachment but I am unable to locate any records for that in the chart. I will defer assessment and management of ophthalmic issues to the primary team and to the ophthalmology team. I did make the primary team aware of this thou.   Recommendations   -Low suspicion for the mono-occular  right eye R inferior quadrant floaters to be cerebral in origin. - Recommend PT for the noted BL hip flexion weakness, with the rest of the muscle group intact and her endorsing not using her leg muscles, I think this is likely due to disuse. IF this does not improve with PT for a couple months, she should follow up with neurology clinic. ______________________________________________________________________   Thank you for the opportunity to take part  in the care of this patient. If you have any further questions, please contact the neurology consultation attending.  Signed,  Baker Pager Number [phone]

## 2020-01-12 ENCOUNTER — Inpatient Hospital Stay: Payer: Medicare PPO

## 2020-01-12 DIAGNOSIS — I2721 Secondary pulmonary arterial hypertension: Secondary | ICD-10-CM

## 2020-01-12 LAB — GLUCOSE, CAPILLARY
Glucose-Capillary: 165 mg/dL — ABNORMAL HIGH (ref 70–99)
Glucose-Capillary: 211 mg/dL — ABNORMAL HIGH (ref 70–99)
Glucose-Capillary: 256 mg/dL — ABNORMAL HIGH (ref 70–99)
Glucose-Capillary: 260 mg/dL — ABNORMAL HIGH (ref 70–99)

## 2020-01-12 LAB — BASIC METABOLIC PANEL
Anion gap: 12 (ref 5–15)
BUN: 21 mg/dL (ref 8–23)
CO2: 19 mmol/L — ABNORMAL LOW (ref 22–32)
Calcium: 8.9 mg/dL (ref 8.9–10.3)
Chloride: 107 mmol/L (ref 98–111)
Creatinine, Ser: 1.5 mg/dL — ABNORMAL HIGH (ref 0.44–1.00)
GFR calc Af Amer: 40 mL/min — ABNORMAL LOW (ref >60)
GFR calc non Af Amer: 34 mL/min — ABNORMAL LOW (ref >60)
Glucose, Bld: 260 mg/dL — ABNORMAL HIGH (ref 70–99)
Potassium: 3.4 mmol/L — ABNORMAL LOW (ref 3.5–5.1)
Sodium: 138 mmol/L (ref 135–145)

## 2020-01-12 LAB — C-REACTIVE PROTEIN: CRP: 2.2 mg/dL — ABNORMAL HIGH (ref <1.0)

## 2020-01-12 MED ORDER — SODIUM CHLORIDE 0.9% FLUSH: 3.0000 mL | INTRAVENOUS | Status: DC | PRN

## 2020-01-12 MED ORDER — GADOBUTROL 1 MMOL/ML IV SOLN
7.0000 mL | Freq: Once | INTRAVENOUS | Status: AC | PRN
  Administered 2020-01-12: 7 mL via INTRAVENOUS

## 2020-01-12 MED ORDER — SODIUM CHLORIDE 0.9 % IV SOLN: INTRAVENOUS | Status: DC

## 2020-01-12 MED ORDER — POTASSIUM CHLORIDE CRYS ER 20 MEQ PO TBCR
40.0000 meq | EXTENDED_RELEASE_TABLET | Freq: Once | ORAL | Status: AC
  Administered 2020-01-12: 40 meq via ORAL
  Filled 2020-01-12: qty 2

## 2020-01-12 MED ORDER — FUROSEMIDE 10 MG/ML IJ SOLN
40.0000 mg | Freq: Once | INTRAMUSCULAR | Status: AC
  Administered 2020-01-12: 40 mg via INTRAVENOUS
  Filled 2020-01-12: qty 4

## 2020-01-12 MED ORDER — SODIUM CHLORIDE 0.9 % IV SOLN: 250.0000 mL | INTRAVENOUS | Status: DC | PRN

## 2020-01-12 MED ORDER — SODIUM CHLORIDE 0.9% FLUSH
3.0000 mL | Freq: Two times a day (BID) | INTRAVENOUS | Status: DC
  Administered 2020-01-12 – 2020-01-13 (×2): 3 mL via INTRAVENOUS

## 2020-01-12 NOTE — Consult Note (Signed)
Subjective: 74 yo with h/o DM without retinopathy,, migraine with aura ( as documented in 2018 at the [facility] at which time pt presented with 3 day h/o orange flashing lights which lasted 3 days and resolved), 2014 retinal tear OD which was lasered had has since been stable. Now admitted with hyperglycemia and pulmonary hypertension.   Presently c/o 3-4 day h/o a red and blue Rubik cube which passed through her vision, seemingly in the right eye lasting 30-45 seconds occurring every few minutes.  Denies change in visual acuity, pain or diplopia.   Objective: Vital signs in last 24 hours: Temp:  [97.8 F (36.6 C)-98.2 F (36.8 C)] 98.1 F (36.7 C) (08/08 0733) Pulse Rate:  [67-87] 75 (08/08 0733) Resp:  [18-19] 18 (08/08 0733) BP: (136-174)/(64-91) 174/83 (08/08 0733) SpO2:  [92 %-100 %] 93 % (08/08 0733) Weight:  [69.5 kg] 69.5 kg (08/08 0428)    EXAM:  VA  20/30 OD 30/30 OS              Pupils symmetric and reactive. No APD              IOP 19 mmHg OD  20 mmHg OS              Anterior seg normal OU x mild cataracts OU.   DFE: Mild Nonproliferative diabetic retinopathy OU            Normal appearing optic nerve OU            There is no vascular occlusion, gross hemorrhage, tear or RD.            There is an area of laser scarring in the superior temporal retina OD.          Recent Labs    01/10/20 0429 01/11/20 0908  WBC 4.1  --   HGB 9.6*  --   HCT 28.8*  --   NA 137 138  K 4.3 3.4*  CL 111 107  CO2 16* 19*  BUN 31* 27*  CREATININE 1.66* 1.66*    Studies/Results: NM Pulmonary Perfusion  Result Date: 01/10/2020 CLINICAL DATA:  74 year old female with chest pain and shortness of breath x1 month. COPD. EXAM: NUCLEAR MEDICINE PERFUSION LUNG SCAN TECHNIQUE: Perfusion images were obtained in multiple projections after intravenous injection of radiopharmaceutical. Ventilation scans intentionally deferred if perfusion scan and chest x-ray adequate for  interpretation during COVID 19 epidemic. RADIOPHARMACEUTICALS:  4.9 mCi Tc-72mMAA IV COMPARISON:  Portable chest 1108 hours today. FINDINGS: Homogeneous perfusion radiotracer activity throughout both lungs. Cardiomegaly related mediastinal photopenia. No perfusion defect identified. IMPRESSION: Homogeneous perfusion.  No evidence of pulmonary embolus. Electronically Signed   By: HGenevie AnnM.D.   On: 01/10/2020 14:30     Assessment/Plan:  74yo with hyperglycemia, pulmonary hypertension, mild diabetic retinopathy and cataracts.  Her present symptoms of frequent visual aura OD are not well explained by the eye exam.    Ddx includes atypical migrainous aura without headache, impending ischemia of the optic nerve or retina, compressive lesion of the optic chiasm or optic tract.    Sed rate was slightly elevated. Consider a CRP. If both are significantly elevated GCA could be considered although this would be a very unusual presentation of it.    Given the optic nerve, chiasm or central etiologies that may explain her symptoms, I recommend an MRI with contrast if not otherwise contraindicated.     Pt has an appointment with me  at [facility] in one month.  LOS: 3 days   Birder Robson 8/8/202111:52 AM

## 2020-01-12 NOTE — Progress Notes (Signed)
Brief Neuro Update:  Appreciate Ophthalmology input and help with this case. MRI Brain with and without contrast per ophthalmology. I added MRI Orbit with and without contrast.  [place] Pager Number [phone]

## 2020-01-12 NOTE — Progress Notes (Signed)
Patient ID: Jamie Murray, female   DOB: 04-08-1946, 74 y.o.   MRN: [id number] [facility] PROGRESS NOTE  Kacey Dysert EHO:[id number] DOB: 03-Oct-1945 DOA: 01/09/2020 PCP: Perrin Maltese, MD  HPI/Subjective: Patient feeling okay.  Still having the floaters in her right eye.  Happened again while I was in the room with her this morning.  Patient told me that she did have a retinal detachment around 10 years ago in that eye.  Breathing is okay.  Objective: Vitals:   01/12/20 0733 01/12/20 1210  BP: (!) 174/83 135/89  Pulse: 75 73  Resp: 18 18  Temp: 98.1 F (36.7 C)   SpO2: 93% 100%    Intake/Output Summary (Last 24 hours) at 01/12/2020 1319 Last data filed at 01/12/2020 0934 Gross per 24 hour  Intake 480 ml  Output 600 ml  Net -120 ml   Filed Weights   01/09/20 0555 01/11/20 0453 01/12/20 0428  Weight: 74.8 kg 68.8 kg 69.5 kg    ROS: Review of Systems  Respiratory: Positive for shortness of breath.   Cardiovascular: Negative for chest pain.  Gastrointestinal: Negative for abdominal pain.   Exam: Physical Exam HENT:     Nose: No mucosal edema.     Mouth/Throat:     Pharynx: No oropharyngeal exudate.  Eyes:     General: Lids are normal.     Conjunctiva/sclera: Conjunctivae normal.     Pupils: Pupils are equal, round, and reactive to light.  Cardiovascular:     Rate and Rhythm: Normal rate and regular rhythm.     Heart sounds: Normal heart sounds, S1 normal and S2 normal.  Pulmonary:     Breath sounds: No decreased breath sounds, wheezing, rhonchi or rales.  Abdominal:     Palpations: Abdomen is soft.     Tenderness: There is no abdominal tenderness.  Musculoskeletal:     Right lower leg: No swelling.     Left lower leg: No swelling.  Skin:    General: Skin is warm.     Findings: No rash.  Neurological:     Mental Status: She is alert and oriented to person, place, and time.     Cranial Nerves: No cranial nerve deficit.       Data  Reviewed: Basic Metabolic Panel: Recent Labs  Lab 01/09/20 0554 01/09/20 1631 01/10/20 0429 01/11/20 0908  NA 132* 137 137 138  K 4.3 3.8 4.3 3.4*  CL 101 108 111 107  CO2 18* 20* 16* 19*  GLUCOSE 792* 240* 362* 236*  BUN 36* 29* 31* 27*  CREATININE 2.03* 1.80* 1.66* 1.66*  CALCIUM 8.8* 9.2 8.5* 9.3  MG 2.5*  --   --   --    Liver Function Tests: Recent Labs  Lab 01/09/20 0554  AST 36  ALT 26  ALKPHOS 71  BILITOT 1.2  PROT 6.8  ALBUMIN 3.7   CBC: Recent Labs  Lab 01/09/20 0554 01/09/20 0952 01/10/20 0429  WBC 3.9* 3.9* 4.1  NEUTROABS 2.5  --   --   HGB 10.2* 10.1* 9.6*  HCT 30.5* 31.4* 28.8*  MCV 90.2 92.1 91.1  PLT 112* 110* 109*   BNP (last 3 results) Recent Labs    01/09/20 0632  BNP 2,387.1*     CBG: Recent Labs  Lab 01/11/20 1146 01/11/20 1615 01/11/20 2106 01/12/20 0734 01/12/20 1210  GLUCAP 192* 213* 245* 165* 211*    Recent Results (from the past 240 hour(s))  SARS Coronavirus 2 by RT PCR (  hospital order, performed in [facility] hospital lab) Nasopharyngeal Nasopharyngeal Swab     Status: None   Collection Time: 01/09/20  6:32 AM   Specimen: Nasopharyngeal Swab  Result Value Ref Range Status   SARS Coronavirus 2 NEGATIVE NEGATIVE Final    Comment: (NOTE) SARS-CoV-2 target nucleic acids are NOT DETECTED.  The SARS-CoV-2 RNA is generally detectable in upper and lower respiratory specimens during the acute phase of infection. The lowest concentration of SARS-CoV-2 viral copies this assay can detect is 250 copies / mL. A negative result does not preclude SARS-CoV-2 infection and should not be used as the sole basis for treatment or other patient management decisions.  A negative result may occur with improper specimen collection / handling, submission of specimen other than nasopharyngeal swab, presence of viral mutation(s) within the areas targeted by this assay, and inadequate number of viral copies (<250 copies / mL). A negative  result must be combined with clinical observations, patient history, and epidemiological information.  Fact Sheet for Patients:   StrictlyIdeas.no  Fact Sheet for Healthcare Providers: BankingDealers.co.za  This test is not yet approved or  cleared by the [place] FDA and has been authorized for detection and/or diagnosis of SARS-CoV-2 by FDA under an Emergency Use Authorization (EUA).  This EUA will remain in effect (meaning this test can be used) for the duration of the COVID-19 declaration under Section 564(b)(1) of the Act, 21 U.S.C. section 360bbb-3(b)(1), unless the authorization is terminated or revoked sooner.  Performed at [facility], [address] [place],  [address]      Studies: NM Pulmonary Perfusion  Result Date: 01/10/2020 CLINICAL DATA:  74 year old female with chest pain and shortness of breath x1 month. COPD. EXAM: NUCLEAR MEDICINE PERFUSION LUNG SCAN TECHNIQUE: Perfusion images were obtained in multiple projections after intravenous injection of radiopharmaceutical. Ventilation scans intentionally deferred if perfusion scan and chest x-ray adequate for interpretation during COVID 19 epidemic. RADIOPHARMACEUTICALS:  4.9 mCi Tc-44mMAA IV COMPARISON:  Portable chest 1108 hours today. FINDINGS: Homogeneous perfusion radiotracer activity throughout both lungs. Cardiomegaly related mediastinal photopenia. No perfusion defect identified. IMPRESSION: Homogeneous perfusion.  No evidence of pulmonary embolus. Electronically Signed   By: HGenevie AnnM.D.   On: 01/10/2020 14:30    Scheduled Meds: . amLODipine  5 mg Oral Daily  . aspirin EC  81 mg Oral Daily  . atorvastatin  10 mg Oral q1800  . enoxaparin (LOVENOX) injection  40 mg Subcutaneous Q24H  . ferrous [id number]-folic acid  1 capsule Oral Daily  . hydrALAZINE  10 mg Intravenous Once  . hydrALAZINE  50 mg Oral Q8H  . insulin aspart  0-15  Units Subcutaneous TID WC  . insulin aspart  0-5 Units Subcutaneous QHS  . insulin aspart  3 Units Subcutaneous TID WC  . insulin detemir  18 Units Subcutaneous QHS  . isosorbide mononitrate  30 mg Oral Daily  . levothyroxine  75 mcg Oral Q0600  . multivitamin with minerals  1 tablet Oral Daily  . Vilazodone HCl  20 mg Oral Daily  . zaleplon  10 mg Oral QHS   Continuous Infusions:  Assessment/Plan:  1. Shortness of breath with pulmonary hypertension.  VQ scan negative.  Cardiology planning on a right and left heart catheterization on Monday. 2. Right eye floaters.  Case discussed again with ophthalmology and he came in and saw the patient.  Ophthalmology will follow up as outpatient.  Case discussed with neurology and MRI of the brain  and orbits.  Autoimmune work-up and CRP ordered. 3. Type 2 diabetes mellitus with hyperglycemia and hyperlipidemia.  Hemoglobin A1c elevated at 10.9.  Continue Levemir 4. Acute kidney injury on chronic disease stage IIIb.  Creatinine stable at 1.66.  Recheck tomorrow morning. 5. Hypothyroidism unspecified on levothyroxine 6. Anxiety depression continue psychiatric medications 7. Sleep apnea on CPAP at night 8. Essential hypertension on hydralazine and Norvasc        Code Status:     Code Status Orders  (From admission, onward)         Start     Ordered   01/09/20 0913  Full code  Continuous        01/09/20 0915        Code Status History    Date Active Date Inactive Code Status Order ID Comments User Context   08/09/2016 2359 08/11/2016 1556 Full Code [id number]  Harvie Bridge, DO Inpatient   12/22/2015 0816 12/24/2015 1352 Full Code [id number]  Demetrios Loll, MD Inpatient   Advance Care Planning Activity     Disposition Plan: Status is: Inpatient  Dispo: The patient is from: Home              Anticipated d/c is to: Home              Anticipated d/c date is: Earliest potential will be on 01/14/2020              Patient currently scheduled  to have a left and right heart cath tomorrow.  Ordering an MRI of the brain today because of her visual symptoms  Consultants:  Cardiology  Neurology  Ophthalmology  Time spent: 28 minutes.  Case discussed both with neurology and ophthalmology.  [place]  [facility] [employer]

## 2020-01-12 NOTE — Progress Notes (Signed)
Pt prefers to wait until tomorrow night to go on cpap.

## 2020-01-12 NOTE — Progress Notes (Addendum)
Progress Note  Patient Name: Jamie Murray Date of Encounter: 01/12/2020  [place] Cardiologist: No primary care provider on file.   Subjective   Feeling well.  Continues to have R eye floaters.   Inpatient Medications    Scheduled Meds: . amLODipine  5 mg Oral Daily  . aspirin EC  81 mg Oral Daily  . atorvastatin  10 mg Oral q1800  . enoxaparin (LOVENOX) injection  40 mg Subcutaneous Q24H  . ferrous [id number] C-folic acid  1 capsule Oral Daily  . hydrALAZINE  10 mg Intravenous Once  . hydrALAZINE  50 mg Oral Q8H  . insulin aspart  0-15 Units Subcutaneous TID WC  . insulin aspart  0-5 Units Subcutaneous QHS  . insulin aspart  3 Units Subcutaneous TID WC  . insulin detemir  18 Units Subcutaneous QHS  . isosorbide mononitrate  30 mg Oral Daily  . levothyroxine  75 mcg Oral Q0600  . multivitamin with minerals  1 tablet Oral Daily  . Vilazodone HCl  20 mg Oral Daily  . zaleplon  10 mg Oral QHS   Continuous Infusions:  PRN Meds: acetaminophen **OR** acetaminophen, dextrose, LORazepam   Vital Signs    Vitals:   01/11/20 1919 01/11/20 2030 01/12/20 0428 01/12/20 0733  BP: 136/81 (!) 158/64 (!) 168/91 (!) 174/83  Pulse: 87 67 82 75  Resp: _0 Temp: 98.2 F (36.8 C) 98.1 F (36.7 C) 97.8 F (36.6 C) 98.1 F (36.7 C)  TempSrc: Oral Oral Oral Oral  SpO2: 92% 100% 93% 93%  Weight:   69.5 kg   Height:        Intake/Output Summary (Last 24 hours) at 01/12/2020 1149 Last data filed at 01/12/2020 0934 Gross per 24 hour  Intake 480 ml  Output 600 ml  Net -120 ml   Last 3 Weights 01/12/2020 01/11/2020 01/09/2020  Weight (lbs) 153 lb 3.2 oz 151 lb 9.6 oz 165 lb  Weight (kg) 69.491 kg 68.765 kg 74.844 kg      Telemetry    Sinus rhythm - Personally Reviewed  ECG    01/09/2020: Sinus arrhythmia.  First-degree heart block.  Right bundle branch block.   - Personally Reviewed  Physical Exam   VS:  BP (!) 174/83 (BP Location: Right Arm)    Pulse 75   Temp 98.1 F (36.7 C) (Oral)   Resp 18   Ht _1  (1.702 m)   Wt 69.5 kg   LMP  (LMP Unknown)   SpO2 93%   BMI 23.99 kg/m  , BMI Body mass index is 23.99 kg/m. GENERAL:  Well appearing HEENT: Pupils equal round and reactive, fundi not visualized, oral mucosa unremarkable NECK:  JVP to mid neck at 45 degrees, waveform within normal limits, carotid upstroke brisk and symmetric, no bruits, no thyromegaly LUNGS:  Clear to auscultation bilaterally HEART:  RRR.  PMI not displaced or sustained,S1 and S2 within normal limits, no S3, no S4, no clicks, no rubs, II/VI systolic murmur ABD:  Flat, positive bowel sounds normal in frequency in pitch, no bruits, no rebound, no guarding, no midline pulsatile mass, no hepatomegaly, no splenomegaly EXT:  2 plus pulses throughout, no edema, no cyanosis no clubbing SKIN:  No rashes no nodules NEURO:  Cranial nerves II through XII grossly intact, motor grossly intact throughout PSYCH:  Cognitively intact, oriented to person place and time   Labs    High Sensitivity Troponin:   Recent Labs  Lab 01/09/20  8657 01/09/20 0747 01/09/20 0952 01/09/20 1116  TROPONINIHS 84* 93* 90* 77*      Chemistry Recent Labs  Lab 01/09/20 0554 01/09/20 0554 01/09/20 1631 01/10/20 0429 01/11/20 0908  NA 132*   < > 137 137 138  K 4.3   < > 3.8 4.3 3.4*  CL 101   < > 108 111 107  CO2 18*   < > 20* 16* 19*  GLUCOSE 792*   < > 240* 362* 236*  BUN 36*   < > 29* 31* 27*  CREATININE 2.03*   < > 1.80* 1.66* 1.66*  CALCIUM 8.8*   < > 9.2 8.5* 9.3  PROT 6.8  --   --   --   --   ALBUMIN 3.7  --   --   --   --   AST 36  --   --   --   --   ALT 26  --   --   --   --   ALKPHOS 71  --   --   --   --   BILITOT 1.2  --   --   --   --   GFRNONAA 24*   < > 27* 30* 30*  GFRAA 27*   < > 32* 35* 35*  ANIONGAP 13   < > _0 < > = values in this interval not displayed.     Hematology Recent Labs  Lab 01/09/20 0554 01/09/20 0952 01/10/20 0429  WBC  3.9* 3.9* 4.1  RBC 3.38* 3.41* 3.16*  HGB 10.2* 10.1* 9.6*  HCT 30.5* 31.4* 28.8*  MCV 90.2 92.1 91.1  MCH 30.2 29.6 30.4  MCHC 33.4 32.2 33.3  RDW 14.6 14.6 14.6  PLT 112* 110* 109*    BNP Recent Labs  Lab 01/09/20 0632  BNP 2,387.1*     DDimer No results for input(s): DDIMER in the last 168 hours.   Radiology    NM Pulmonary Perfusion  Result Date: 01/10/2020 CLINICAL DATA:  74 year old female with chest pain and shortness of breath x1 month. COPD. EXAM: NUCLEAR MEDICINE PERFUSION LUNG SCAN TECHNIQUE: Perfusion images were obtained in multiple projections after intravenous injection of radiopharmaceutical. Ventilation scans intentionally deferred if perfusion scan and chest x-ray adequate for interpretation during COVID 19 epidemic. RADIOPHARMACEUTICALS:  4.9 mCi Tc-75mMAA IV COMPARISON:  Portable chest 1108 hours today. FINDINGS: Homogeneous perfusion radiotracer activity throughout both lungs. Cardiomegaly related mediastinal photopenia. No perfusion defect identified. IMPRESSION: Homogeneous perfusion.  No evidence of pulmonary embolus. Electronically Signed   By: HGenevie AnnM.D.   On: 01/10/2020 14:30    Cardiac Studies   Echo 01/09/20: 1. Left ventricular ejection fraction, by estimation, is 55 to 60%. The  left ventricle has normal function. The left ventricle has no regional  wall motion abnormalities. There is mild left ventricular hypertrophy.  Left ventricular diastolic parameters  are consistent with Grade I diastolic dysfunction (impaired relaxation).  There is the interventricular septum is flattened in systole, consistent  with right ventricular pressure overload.  2. Right ventricular systolic function is moderately reduced. The right  ventricular size is moderately enlarged. Moderately increased right  ventricular wall thickness. There is severely elevated pulmonary artery  systolic pressure. The estimated right  ventricular systolic pressure is 884.6mmHg.  3.  Right atrial size was mildly dilated.  4. The mitral valve is normal in structure. No evidence of mitral valve  regurgitation. No evidence of mitral stenosis.  5.  Tricuspid valve regurgitation is mild to moderate.  6. The aortic valve is normal in structure. Aortic valve regurgitation is  trivial. Mild aortic valve sclerosis is present, with no evidence of  aortic valve stenosis.  7. Pulmonic valve regurgitation is severe.  8. The inferior vena cava is dilated in size with <50% respiratory  variability, suggesting right atrial pressure of 15 mmHg.    PFTs 11/08/16: Moderate Restrictive Airways Disease with Significant BD response  Probable obstructive small airways disease  Recommend smoking cessation  PFTs 04/2016: Normal  Patient Profile     74 y.o. female with severe help pulmonary hypertension, hypertension, diabetes, prior stroke, and OSA on CPAP admitted with acute on chronic right-sided heart failure and pulmonary hypertension.  Assessment & Plan    # Severe pulmonary hypertension:  # Acute on chronic right sided heart failure: # OSA: The cause of her severe pulmonary hypertension is likely multifactorial.  This has been ongoing since at least 2017.  Her PFTs are difficult to decipher.  She had PFTs in 2017 that were reportedly normal.  However 6 months later she had moderate restrictive lung disease.  She has never smoked.  She has had significant secondhand smoke.  She also does have OSA and struggles somewhat with the use of her CPAP.  ESR is positive.  We will send ANA, RF, ANCA, anti-CCP, anti-scleroderma, anti-Smith antibodies.  V/Q scan was negative for CTEPH.  She is currently breathing comfortably on room air.  She is mildly volume overloaded on exam.  IVC was dilated on echo.  She was given IV fluids on admission which have subsequently been discontinued.  Her renal function has improved with this.  Given that she is comfortable we will wait for right heart cath on  Monday before attempting diuresis.  She will likely benefit from evaluation by Dr. Haroldine Laws on [facility] for further work-up and management of her pulmonary hypertension.  We will need to better define her underlying lung disease before considering her for pulmonary vasodilators.  Repeat PFTs either inpatient or as an outpatient.  There is no significant scarring on her chest X-ray or prior chest CT.  NPO after midnight for RHC tomorrow.  # Essential hypertension: Blood pressure poorly-controlled but improving.  At home she takes metoprolol, losartan, furosemide, hydralazine and spironolactone.  Her home losartan and spironolactone are on hold due to her renal dysfunction.  Amlodipine was added and hydralazine increased.   # Floaters:  She is seeing floaters in her vision.  She has been evaluated by Ophthalmology.  There is concern for temporal arteritis.  We will add a CRP to the above rheumatologic work-up.  She may need a temporal artery biopsy if this is abnormal.  # Prior stroke:  Blood pressure control as above.  Continue aspirin and atorvastatin.      For questions or updates, please contact [place] Please consult [email or web address] for contact info under        Signed, Skeet Latch, MD  01/12/2020, 11:49 AM

## 2020-01-13 ENCOUNTER — Encounter
Admission: EM | Disposition: A | Discharge status: Home / Self Care | Payer: Self-pay | Source: Home / Self Care | Attending: Internal Medicine

## 2020-01-13 DIAGNOSIS — E1169 Type 2 diabetes mellitus with other specified complication: Secondary | ICD-10-CM

## 2020-01-13 DIAGNOSIS — I10 Essential (primary) hypertension: Secondary | ICD-10-CM

## 2020-01-13 DIAGNOSIS — I2721 Secondary pulmonary arterial hypertension: Secondary | ICD-10-CM

## 2020-01-13 DIAGNOSIS — E785 Hyperlipidemia, unspecified: Secondary | ICD-10-CM

## 2020-01-13 DIAGNOSIS — E876 Hypokalemia: Secondary | ICD-10-CM

## 2020-01-13 DIAGNOSIS — R0602 Shortness of breath: Secondary | ICD-10-CM

## 2020-01-13 HISTORY — PX: RIGHT/LEFT HEART CATH AND CORONARY/GRAFT ANGIOGRAPHY: CATH118267

## 2020-01-13 LAB — BASIC METABOLIC PANEL
Anion gap: 11 (ref 5–15)
BUN: 21 mg/dL (ref 8–23)
CO2: 20 mmol/L — ABNORMAL LOW (ref 22–32)
Calcium: 9.1 mg/dL (ref 8.9–10.3)
Chloride: 108 mmol/L (ref 98–111)
Creatinine, Ser: 1.39 mg/dL — ABNORMAL HIGH (ref 0.44–1.00)
GFR calc Af Amer: 43 mL/min — ABNORMAL LOW (ref >60)
GFR calc non Af Amer: 38 mL/min — ABNORMAL LOW (ref >60)
Glucose, Bld: 83 mg/dL (ref 70–99)
Potassium: 3.3 mmol/L — ABNORMAL LOW (ref 3.5–5.1)
Sodium: 139 mmol/L (ref 135–145)

## 2020-01-13 LAB — GLUCOSE, CAPILLARY
Glucose-Capillary: 259 mg/dL — ABNORMAL HIGH (ref 70–99)
Glucose-Capillary: 91 mg/dL (ref 70–99)
Glucose-Capillary: 117 mg/dL — ABNORMAL HIGH (ref 70–99)
Glucose-Capillary: 115 mg/dL — ABNORMAL HIGH (ref 70–99)
Glucose-Capillary: 114 mg/dL — ABNORMAL HIGH (ref 70–99)
Glucose-Capillary: 343 mg/dL — ABNORMAL HIGH (ref 70–99)

## 2020-01-13 SURGERY — RIGHT/LEFT HEART CATH AND CORONARY/GRAFT ANGIOGRAPHY
Anesthesia: Moderate Sedation

## 2020-01-13 MED ORDER — INSULIN ASPART 100 UNIT/ML FLEXPEN
4.0000 [IU] | PEN_INJECTOR | Freq: Three times a day (TID) | SUBCUTANEOUS | 0 refills | Status: DC
Start: 2020-01-13 — End: 2022-02-22

## 2020-01-13 MED ORDER — HEPARIN SODIUM (PORCINE) 1000 UNIT/ML IJ SOLN
INTRAMUSCULAR | Status: DC | PRN
  Administered 2020-01-13: 3500 [IU] via INTRAVENOUS

## 2020-01-13 MED ORDER — SODIUM CHLORIDE 0.9 % IV SOLN: 250.0000 mL | INTRAVENOUS | Status: DC | PRN

## 2020-01-13 MED ORDER — VERAPAMIL HCL 2.5 MG/ML IV SOLN
INTRAVENOUS | Status: DC | PRN
  Administered 2020-01-13: 2.5 mg via INTRA_ARTERIAL

## 2020-01-13 MED ORDER — INSULIN DETEMIR 100 UNIT/ML FLEXPEN
17.0000 [IU] | PEN_INJECTOR | Freq: Every evening | SUBCUTANEOUS | 0 refills | Status: DC
Start: 2020-01-14 — End: 2020-03-17

## 2020-01-13 MED ORDER — FENTANYL CITRATE (PF) 100 MCG/2ML IJ SOLN
INTRAMUSCULAR | Status: DC | PRN
  Administered 2020-01-13: 25 ug via INTRAVENOUS

## 2020-01-13 MED ORDER — SODIUM CHLORIDE 0.9% FLUSH: 3.0000 mL | INTRAVENOUS | Status: DC | PRN

## 2020-01-13 MED ORDER — POTASSIUM CHLORIDE CRYS ER 20 MEQ PO TBCR
40.0000 meq | EXTENDED_RELEASE_TABLET | Freq: Once | ORAL | Status: AC
  Administered 2020-01-13: 40 meq via ORAL
  Filled 2020-01-13: qty 2

## 2020-01-13 MED ORDER — MIDAZOLAM HCL 2 MG/2ML IJ SOLN
INTRAMUSCULAR | Status: AC
  Filled 2020-01-13: qty 2

## 2020-01-13 MED ORDER — INSULIN DETEMIR 100 UNIT/ML ~~LOC~~ SOLN
15.0000 [IU] | Freq: Every day | SUBCUTANEOUS | Status: DC
  Filled 2020-01-13: qty 0.15

## 2020-01-13 MED ORDER — VERAPAMIL HCL 2.5 MG/ML IV SOLN
INTRAVENOUS | Status: AC
  Filled 2020-01-13: qty 2

## 2020-01-13 MED ORDER — AMLODIPINE BESYLATE 5 MG PO TABS: 5.0000 mg | ORAL_TABLET | Freq: Every day | ORAL | 0 refills | Status: DC

## 2020-01-13 MED ORDER — MIDAZOLAM HCL 2 MG/2ML IJ SOLN
INTRAMUSCULAR | Status: DC | PRN
  Administered 2020-01-13: 1 mg via INTRAVENOUS

## 2020-01-13 MED ORDER — FENTANYL CITRATE (PF) 100 MCG/2ML IJ SOLN
INTRAMUSCULAR | Status: AC
  Filled 2020-01-13: qty 2

## 2020-01-13 MED ORDER — ISOSORBIDE MONONITRATE ER 30 MG PO TB24: 30.0000 mg | ORAL_TABLET | Freq: Every day | ORAL | 0 refills | Status: DC

## 2020-01-13 MED ORDER — SODIUM CHLORIDE 0.9% FLUSH: 3.0000 mL | Freq: Two times a day (BID) | INTRAVENOUS | Status: DC

## 2020-01-13 MED ORDER — IOHEXOL 300 MG/ML  SOLN
INTRAMUSCULAR | Status: DC | PRN
  Administered 2020-01-13: 40 mL

## 2020-01-13 MED ORDER — INSULIN DETEMIR 100 UNIT/ML ~~LOC~~ SOLN
17.0000 [IU] | Freq: Every day | SUBCUTANEOUS | Status: DC
  Administered 2020-01-13: 17 [IU] via SUBCUTANEOUS
  Filled 2020-01-13: qty 0.17

## 2020-01-13 MED ORDER — INSULIN PEN NEEDLE 31G X 5 MM MISC: 1.0000 | Freq: Three times a day (TID) | 3 refills | Status: DC

## 2020-01-13 MED ORDER — HYDRALAZINE HCL 50 MG PO TABS: 50.0000 mg | ORAL_TABLET | Freq: Three times a day (TID) | ORAL | 0 refills | Status: DC

## 2020-01-13 MED ORDER — HEPARIN (PORCINE) IN NACL 1000-0.9 UT/500ML-% IV SOLN
INTRAVENOUS | Status: DC | PRN
  Administered 2020-01-13: 500 mL

## 2020-01-13 MED ORDER — HEPARIN SODIUM (PORCINE) 1000 UNIT/ML IJ SOLN
INTRAMUSCULAR | Status: AC
  Filled 2020-01-13: qty 1

## 2020-01-13 SURGICAL SUPPLY — 10 items
CATH BALLN WEDGE 5F 110CM (CATHETERS) ×3 IMPLANT
CATH INFINITI 5 FR JL3.5 (CATHETERS) ×3 IMPLANT
CATH INFINITI 5FR JK (CATHETERS) ×3 IMPLANT
DEVICE RAD TR BAND REGULAR (VASCULAR PRODUCTS) ×3 IMPLANT
GLIDESHEATH SLEND SS 6F .021 (SHEATH) ×3 IMPLANT
GUIDEWIRE INQWIRE 1.5J.035X260 (WIRE) ×1 IMPLANT
INQWIRE 1.5J .035X260CM (WIRE) ×3
KIT MANI 3VAL PERCEP (MISCELLANEOUS) ×3 IMPLANT
PACK CARDIAC CATH (CUSTOM PROCEDURE TRAY) ×3 IMPLANT
SHEATH GLIDE SLENDER 4/5FR (SHEATH) ×3 IMPLANT

## 2020-01-13 NOTE — Progress Notes (Signed)
Discharge instructions reviewed with pt and daughter at the bedside. Opportunity for questions made available. Pt verbalized understanding of d/c instructions. IV & telemetry removed per protocol. Daughter at the bedside to d/c pt home.

## 2020-01-13 NOTE — Plan of Care (Signed)
MRI brain and orbits negative for acute process. ESR and CRP mildly elevated agree with ophthalmologist Dr Valentina Gu - that presentation atypical for GCA. No new inpatient neurology recommendations. PT OT  Outpatient ophthalmology and neurology follow ups Please call with questions.   -- Amie Portland, MD [facility] Pager: [phone]

## 2020-01-13 NOTE — Care Management Important Message (Signed)
Important Message  Patient Details  Name: Jamie Murray MRN: [id number] Date of Birth: May 21, 1946   Medicare Important Message Given:  Yes     Dannette Barbara 01/13/2020, 11:56 AM

## 2020-01-13 NOTE — Progress Notes (Signed)
Patient has remained clinically stable post heart cath per Dr Fletcher Anon, daughter at bedside. Denies complaints at this time. Right radial TR band now with 0 ml air, no bleeding nor hematoma at site. Sinus rhythm per monitor. Report called to Netta Cedars Rn on telemetry with questions answered post procedure.

## 2020-01-13 NOTE — Plan of Care (Signed)
  Problem: Education: Goal: Knowledge of General Education information will improve Description: Including pain rating scale, medication(s)/side effects and non-pharmacologic comfort measures Outcome: Progressing   Problem: Health Behavior/Discharge Planning: Goal: Ability to manage health-related needs will improve Outcome: Progressing

## 2020-01-13 NOTE — Progress Notes (Signed)
Progress Note  Patient Name: Jamie Murray Date of Encounter: 01/13/2020  Primary Cardiologist: Humphrey Rolls  Subjective   She is for [facility] today. No chest pain or dyspnea. Renal function continues to improve.   Inpatient Medications    Scheduled Meds: . amLODipine  5 mg Oral Daily  . aspirin EC  81 mg Oral Daily  . atorvastatin  10 mg Oral q1800  . enoxaparin (LOVENOX) injection  40 mg Subcutaneous Q24H  . ferrous [id number] C-folic acid  1 capsule Oral Daily  . hydrALAZINE  10 mg Intravenous Once  . hydrALAZINE  50 mg Oral Q8H  . insulin aspart  0-15 Units Subcutaneous TID WC  . insulin aspart  0-5 Units Subcutaneous QHS  . insulin aspart  3 Units Subcutaneous TID WC  . insulin detemir  18 Units Subcutaneous QHS  . isosorbide mononitrate  30 mg Oral Daily  . levothyroxine  75 mcg Oral Q0600  . multivitamin with minerals  1 tablet Oral Daily  . sodium chloride flush  3 mL Intravenous Q12H  . Vilazodone HCl  20 mg Oral Daily  . zaleplon  10 mg Oral QHS   Continuous Infusions: . sodium chloride    . sodium chloride 10 mL/hr at 01/13/20 0619   PRN Meds: sodium chloride, acetaminophen **OR** acetaminophen, dextrose, LORazepam, sodium chloride flush   Vital Signs    Vitals:   01/12/20 2143 01/12/20 2215 01/13/20 0332 01/13/20 0747  BP: (!) 167/96  (!) 150/92 138/80  Pulse: 93  93 92  Resp: _0 Temp: 98.4 F (36.9 C)  98.3 F (36.8 C) 98.5 F (36.9 C)  TempSrc:   Oral Oral  SpO2: 99% 99% 100% 99%  Weight:   68 kg   Height:        Intake/Output Summary (Last 24 hours) at 01/13/2020 0844 Last data filed at 01/13/2020 4591 Gross per 24 hour  Intake 249.87 ml  Output 1400 ml  Net -1150.13 ml   Filed Weights   01/11/20 0453 01/12/20 0428 01/13/20 0332  Weight: 68.8 kg 69.5 kg 68 kg    Telemetry    SR, 80s bpm - Personally Reviewed  ECG    No new tracings - Personally Reviewed  Physical Exam   GEN: No acute distress.   Neck: JVD  elevated to the mid neck. Cardiac: RRR, II/VI systolic murmur, no rubs, or gallops.  Respiratory: Clear to auscultation bilaterally.  GI: Soft, nontender, non-distended.   MS: No edema; No deformity. Neuro:  Alert and oriented x 3; Nonfocal.  Psych: Normal affect.  Labs    Chemistry Recent Labs  Lab 01/09/20 0554 01/09/20 1631 01/11/20 0908 01/12/20 1312 01/13/20 0544  NA 132*   < > 138 138 139  K 4.3   < > 3.4* 3.4* 3.3*  CL 101   < > 107 107 108  CO2 18*   < > 19* 19* 20*  GLUCOSE 792*   < > 236* 260* 83  BUN 36*   < > 27* 21 21  CREATININE 2.03*   < > 1.66* 1.50* 1.39*  CALCIUM 8.8*   < > 9.3 8.9 9.1  PROT 6.8  --   --   --   --   ALBUMIN 3.7  --   --   --   --   AST 36  --   --   --   --   ALT 26  --   --   --   --  ALKPHOS 71  --   --   --   --   BILITOT 1.2  --   --   --   --   GFRNONAA 24*   < > 30* 34* 38*  GFRAA 27*   < > 35* 40* 43*  ANIONGAP 13   < > _0 < > = values in this interval not displayed.     Hematology Recent Labs  Lab 01/09/20 0554 01/09/20 0952 01/10/20 0429  WBC 3.9* 3.9* 4.1  RBC 3.38* 3.41* 3.16*  HGB 10.2* 10.1* 9.6*  HCT 30.5* 31.4* 28.8*  MCV 90.2 92.1 91.1  MCH 30.2 29.6 30.4  MCHC 33.4 32.2 33.3  RDW 14.6 14.6 14.6  PLT 112* 110* 109*    Cardiac EnzymesNo results for input(s): TROPONINI in the last 168 hours. No results for input(s): TROPIPOC in the last 168 hours.   BNP Recent Labs  Lab 01/09/20 0632  BNP 2,387.1*     DDimer No results for input(s): DDIMER in the last 168 hours.   Radiology    MR BRAIN W WO CONTRAST  Result Date: 01/12/2020 IMPRESSION: No acute intracranial abnormality. Small chronic infarcts in the right thalamus and right posterior medulla. Negative MRI orbit with contrast Electronically Signed   By: Franchot Gallo M.D.   On: 01/12/2020 15:47   MR ORBITS W WO CONTRAST  Result Date: 01/12/2020 IMPRESSION: No acute intracranial abnormality. Small chronic infarcts in the right thalamus and  right posterior medulla. Negative MRI orbit with contrast Electronically Signed   By: Franchot Gallo M.D.   On: 01/12/2020 15:47    Cardiac Studies   2D echo 01/09/2020: 1. Left ventricular ejection fraction, by estimation, is 55 to 60%. The  left ventricle has normal function. The left ventricle has no regional  wall motion abnormalities. There is mild left ventricular hypertrophy.  Left ventricular diastolic parameters  are consistent with Grade I diastolic dysfunction (impaired relaxation).  There is the interventricular septum is flattened in systole, consistent  with right ventricular pressure overload.  2. Right ventricular systolic function is moderately reduced. The right  ventricular size is moderately enlarged. Moderately increased right  ventricular wall thickness. There is severely elevated pulmonary artery  systolic pressure. The estimated right  ventricular systolic pressure is 82.4 mmHg.  3. Right atrial size was mildly dilated.  4. The mitral valve is normal in structure. No evidence of mitral valve  regurgitation. No evidence of mitral stenosis.  5. Tricuspid valve regurgitation is mild to moderate.  6. The aortic valve is normal in structure. Aortic valve regurgitation is  trivial. Mild aortic valve sclerosis is present, with no evidence of  aortic valve stenosis.  7. Pulmonic valve regurgitation is severe.  8. The inferior vena cava is dilated in size with <50% respiratory  variability, suggesting right atrial pressure of 15 mmHg.  Patient Profile     74 y.o. female who is followed by Dr. Humphrey Rolls, though we were asked to evaluate given he is on vacation with history of HFpEF, severe pulmonary hypertension, mild COPD, DM, CKD stage II, anemia of chronic disease, OSA, extensive secondhand smoking, HLD, hypothyroidism, and GERD who we are seeing for severe pulmonary hypertension with right-heart failure and elevated HS-Tn.  Assessment & Plan    1. Severe pulmonary  hypertension with right-heart failure: -Initially, her pulmonary hypertension with mild to moderate, though echo in 2020 showed a PASP around 85 mmHg with echo this admission of 83.2 mmHg along  with a moderately enlarged and moderate reduced RVSF, moderate TR and a dilated and nonreactive IVC -She has NYHA class IV symptoms with progression of right-heart failure  -No prior pulmonary hypertension workup available for review  -VQ scan this admission low probability for chronic PE -N.p.o. -R/LHC today  -Elevated ESR-ANA, RF, ANCA, anti-CCP, antiscleroderma, anti-Smith antibodies pending -Attempted diuresis as indicated following RHC -She will likely need to establish with Drs. Bensimhon or [name] for management of her pulmonary hypertension -She will likely need repeat PFTs either as inpatient or outpatient -Would be cautious with IV fluids with the above -CPAP while admitted   2. Elevated HS-Tn: -Mildly elevated high-sensitivity troponin and flat trending, not consistent with ACS -Likely supply demand ischemia in the setting of severe pulmonary hypertension with acute on CKD and underlying anemia -Plan for [facility] today -She has several CAD risk factors including poorly controlled diabetes, HLD, and extensive secondhand smoking history -No indication for heparin gtt at this time -It is uncertain why she was placed on Plavix at time of admission, this is not a PTA medication. Her daughter indicates this was discontinued as an outpatient 2 years prior, we will stop this medication  -Continue ASA at this time -Lipitor -Imdur -Risks and benefits of cardiac catheterization have been discussed with the patient including risks of bleeding, bruising, infection, kidney damage, stroke, heart attack, urgent need for cardiac bypass, injury to limb and death. The patient understands these risks and is willing to proceed with the procedure. All questions have been answered and concerns listened to  3.  Acute on CKD stage II: -Renal function improving -Avoid nephrotoxins   4. HTN: -Blood pressure improving -PTA losartan and spironolactone have been held in the setting of renal dysfunction -Continue amlodipine, hydralazine, and Imdur -Titrate as indicated  5. DM: -Poorly controlled -A1c 10.9 this admission -Management per IM  6. Anemia of chronic disease: -Stable -Monitor  7.  Hypokalemia: -Replete to goal 4.0  For questions or updates, please contact [place] Please consult [email or web address] for contact info under Cardiology/STEMI.    Signed, Christell Faith, PA-C [place] Pager: [phone] 01/13/2020, 8:44 AM

## 2020-01-13 NOTE — Discharge Summary (Signed)
[facility] [place] NAME: Jamie Murray    MR#:  [id number]  DATE OF BIRTH:  03-02-1946  DATE OF ADMISSION:  01/09/2020 ADMITTING PHYSICIAN: Collier Bullock, MD  DATE OF DISCHARGE: 01/13/2020  PRIMARY CARE PHYSICIAN: Perrin Maltese, MD    ADMISSION DIAGNOSIS:  Diabetic ketoacidosis without coma associated with type 2 diabetes mellitus ([place]) [E11.10] Hyperglycemia due to diabetes mellitus ([place]) [E11.65]  DISCHARGE DIAGNOSIS:  Principal Problem:   Hyperglycemia due to diabetes mellitus ([place]) Active Problems:   Anxiety and depression   Hypothyroidism   Acute kidney injury superimposed on CKD (HCC)   Severe pulmonary arterial systolic hypertension (HCC)   Elevated troponin level   Shortness of breath   Pulmonary hypertension ([place])   Vitreous floaters of right eye   SECONDARY DIAGNOSIS:   Past Medical History:  Diagnosis Date  . Allergy   . Anemia   . Anxiety   . COPD (chronic obstructive pulmonary disease) ([place])   . Depression   . Diabetes ([place])   . GERD (gastroesophageal reflux disease)   . Hyperlipidemia   . Hypertension   . Insomnia   . Migraines   . Osteoporosis   . [facility] spotted fever   . Transient cerebral ischemia   . Vertigo    every 2-3 months    HOSPITAL COURSE:   1.  Pulmonary hypertension with shortness of breath.  VQ scan was negative.  Right heart cath showed pulmonary hypertension.  Left heart cath was negative.  Patient will be referred to the pulmonary hypertension clinic.  Cardiology set this up. 2.  Right eye floaters.  Patient tells me she has blurred vision in both eyes with distance.  Case discussed with ophthalmology and neurology.  MRI negative for acute event.  Follow-up with ophthalmology as outpatient.  Blurred vision likely secondary to uncontrolled sugars.  Follow-up with neurology as outpatient also.  CRP not very elevated and sedimentation rate borderline.  Less likely temporal  arteritis. 3.  Type 2 diabetes mellitus with hyperglycemia and hyperlipidemia.  Hemoglobin A1c elevated at 10.9.  I started Levemir insulin and will be sent home on 17 units with short acting insulin prior to meals. 4.  Acute kidney injury on chronic kidney disease stage IIIb.  Creatinine has improved to 1.39 upon discharge home. 5.  Hypothyroidism unspecified on levothyroxine 6.  Essential hypertension on hydralazine and Norvasc 7.  Sleep apnea on CPAP at night 8.  Anxiety depression continue psychiatric medications  DISCHARGE CONDITIONS:   Satisfactory  CONSULTS OBTAINED:  Treatment Team:  Wellington Hampshire, MD Donnetta Simpers, MD Birder Robson, MD  DRUG ALLERGIES:   Allergies  Allergen Reactions  . Prozac [Fluoxetine Hcl] Other (See Comments)    "felt crazy"    DISCHARGE MEDICATIONS:   Allergies as of 01/13/2020      Reactions   Prozac [fluoxetine Hcl] Other (See Comments)   "felt crazy"      Medication List    STOP taking these medications   Dulaglutide 1.5 MG/0.5ML Sopn Commonly known as: Trulicity   escitalopram 20 MG tablet Commonly known as: LEXAPRO   Farxiga 10 MG Tabs tablet Generic drug: dapagliflozin propanediol   furosemide 20 MG tablet Commonly known as: LASIX   losartan 100 MG tablet Commonly known as: COZAAR   metFORMIN 500 MG tablet Commonly known as: GLUCOPHAGE   metoprolol succinate 50 MG 24 hr tablet Commonly known as: TOPROL-XL   spironolactone 50 MG tablet Commonly known as:  ALDACTONE     TAKE these medications   albuterol 108 (90 Base) MCG/ACT inhaler Commonly known as: VENTOLIN HFA Inhale 2 puffs into the lungs every 6 (six) hours as needed for wheezing or shortness of breath.   amLODipine 5 MG tablet Commonly known as: NORVASC Take 1 tablet (5 mg total) by mouth daily. Start taking on: January 14, 2020   aspirin 81 MG tablet Take 81 mg by mouth daily.   atorvastatin 10 MG tablet Commonly known as: LIPITOR Take 1  tablet (10 mg total) by mouth daily.   Hemocyte Plus 106-1 MG Caps Take 1 capsule by mouth daily.   hydrALAZINE 50 MG tablet Commonly known as: APRESOLINE Take 1 tablet (50 mg total) by mouth every 8 (eight) hours. What changed:   medication strength  See the new instructions.   insulin aspart 100 UNIT/ML FlexPen Commonly known as: NOVOLOG Inject 4 Units into the skin 3 (three) times daily with meals.   insulin detemir 100 UNIT/ML FlexPen Commonly known as: LEVEMIR Inject 17 Units into the skin every evening. Start taking on: January 14, 2020   Insulin Pen Needle 31G X 5 MM Misc 1 Dose by Does not apply route 4 (four) times daily - after meals and at bedtime.   isosorbide mononitrate 30 MG 24 hr tablet Commonly known as: IMDUR Take 1 tablet (30 mg total) by mouth daily. Start taking on: January 14, 2020 What changed:   how much to take  how to take this  when to take this   levothyroxine 75 MCG tablet Commonly known as: SYNTHROID Take 75 mcg by mouth daily before breakfast.   loratadine 10 MG tablet Commonly known as: CLARITIN Take 10 mg by mouth daily.   meclizine 25 MG tablet Commonly known as: ANTIVERT Take 25 mg by mouth 3 (three) times daily as needed for dizziness.   Multi Adult Gummies Chew Chew by mouth daily.   Viibryd 20 MG Tabs Generic drug: Vilazodone HCl Take 1 tablet by mouth daily.   zaleplon 10 MG capsule Commonly known as: SONATA Take 10 mg by mouth at bedtime.        DISCHARGE INSTRUCTIONS:   Follow-up PMD 5 days Cardiology referred to pulmonary hypertension clinic Follow-up neurology and ophthalmology as outpatient  If you experience worsening of your admission symptoms, develop shortness of breath, life threatening emergency, suicidal or homicidal thoughts you must seek medical attention immediately by calling 911 or calling your MD immediately  if symptoms less severe.  You Must read complete instructions/literature along  with all the possible adverse reactions/side effects for all the Medicines you take and that have been prescribed to you. Take any new Medicines after you have completely understood and accept all the possible adverse reactions/side effects.   Please note  You were cared for by a hospitalist during your hospital stay. If you have any questions about your discharge medications or the care you received while you were in the hospital after you are discharged, you can call the unit and asked to speak with the hospitalist on call if the hospitalist that took care of you is not available. Once you are discharged, your primary care physician will handle any further medical issues. Please note that NO REFILLS for any discharge medications will be authorized once you are discharged, as it is imperative that you return to your primary care physician (or establish a relationship with a primary care physician if you do not have one) for your aftercare needs  so that they can reassess your need for medications and monitor your lab values.    Today   CHIEF COMPLAINT:   Chief Complaint  Patient presents with  . Hyperglycemia    HISTORY OF PRESENT ILLNESS:  Jamie Murray  is a 74 y.o. female came in with elevated sugars and shortness of breath   VITAL SIGNS:  Blood pressure (!) 147/79, pulse 89, temperature 98.2 F (36.8 C), temperature source Oral, resp. rate (!) 22, height _0  (1.702 m), weight 68 kg, SpO2 100 %.  I/O:    Intake/Output Summary (Last 24 hours) at 01/13/2020 1642 Last data filed at 01/13/2020 0619 Gross per 24 hour  Intake 9.87 ml  Output 1400 ml  Net -1390.13 ml     PHYSICAL EXAMINATION:  GENERAL:  74 y.o.-year-old patient lying in the bed with no acute distress.  EYES: Pupils equal, round, reactive to light and accommodation. No scleral icterus. HEENT: Head atraumatic, normocephalic. Oropharynx and nasopharynx clear.  LUNGS: Normal breath sounds bilaterally, no wheezing,  rales,rhonchi or crepitation. No use of accessory muscles of respiration.  CARDIOVASCULAR: S1, S2 normal. No murmurs, rubs, or gallops.  ABDOMEN: Soft, non-tender, non-distended. Bowel sounds present. No organomegaly or mass.  EXTREMITIES: No pedal edema.  NEUROLOGIC: Cranial nerves II through XII are intact. Muscle strength 5/5 in all extremities. Sensation intact. Gait not checked.  PSYCHIATRIC: The patient is alert and oriented x 3.  SKIN: No obvious rash, lesion, or ulcer.   DATA REVIEW:   CBC Recent Labs  Lab 01/10/20 0429  WBC 4.1  HGB 9.6*  HCT 28.8*  PLT 109*    Chemistries  Recent Labs  Lab 01/09/20 0554 01/09/20 1631 01/13/20 0544  NA 132*   < > 139  K 4.3   < > 3.3*  CL 101   < > 108  CO2 18*   < > 20*  GLUCOSE 792*   < > 83  BUN 36*   < > 21  CREATININE 2.03*   < > 1.39*  CALCIUM 8.8*   < > 9.1  MG 2.5*  --   --   AST 36  --   --   ALT 26  --   --   ALKPHOS 71  --   --   BILITOT 1.2  --   --    < > = values in this interval not displayed.     Microbiology Results  Results for orders placed or performed during the hospital encounter of 01/09/20  SARS Coronavirus 2 by RT PCR (hospital order, performed in [facility] hospital lab) Nasopharyngeal Nasopharyngeal Swab     Status: None   Collection Time: 01/09/20  6:32 AM   Specimen: Nasopharyngeal Swab  Result Value Ref Range Status   SARS Coronavirus 2 NEGATIVE NEGATIVE Final    Comment: (NOTE) SARS-CoV-2 target nucleic acids are NOT DETECTED.  The SARS-CoV-2 RNA is generally detectable in upper and lower respiratory specimens during the acute phase of infection. The lowest concentration of SARS-CoV-2 viral copies this assay can detect is 250 copies / mL. A negative result does not preclude SARS-CoV-2 infection and should not be used as the sole basis for treatment or other patient management decisions.  A negative result may occur with improper specimen collection / handling, submission of specimen  other than nasopharyngeal swab, presence of viral mutation(s) within the areas targeted by this assay, and inadequate number of viral copies (<250 copies / mL). A negative result must be combined  with clinical observations, patient history, and epidemiological information.  Fact Sheet for Patients:   StrictlyIdeas.no  Fact Sheet for Healthcare Providers: BankingDealers.co.za  This test is not yet approved or  cleared by the [place] FDA and has been authorized for detection and/or diagnosis of SARS-CoV-2 by FDA under an Emergency Use Authorization (EUA).  This EUA will remain in effect (meaning this test can be used) for the duration of the COVID-19 declaration under Section 564(b)(1) of the Act, 21 U.S.C. section 360bbb-3(b)(1), unless the authorization is terminated or revoked sooner.  Performed at [facility], [address] [place], [place] [address]     RADIOLOGY:  MR BRAIN W WO CONTRAST  Result Date: 01/12/2020 CLINICAL DATA:  Monocular vision loss. EXAM: MRI HEAD AND ORBITS WITHOUT AND WITH CONTRAST TECHNIQUE: Multiplanar, multiecho pulse sequences of the brain and surrounding structures were obtained without and with intravenous contrast. Multiplanar, multiecho pulse sequences of the orbits and surrounding structures were obtained including fat saturation techniques, before and after intravenous contrast administration. CONTRAST:  33m GADAVIST GADOBUTROL 1 MMOL/ML IV SOLN COMPARISON:  MRI head 04/16/2018 FINDINGS: MRI HEAD FINDINGS Brain: Negative for acute infarct. Small chronic infarct right thalamus and right posterior medulla. Ventricle size normal. Negative for hemorrhage or mass lesion Normal enhancement following contrast infusion. Vascular: Normal arterial flow voids Skull and upper cervical spine: No focal skeletal lesion. Other: None MRI ORBITS FINDINGS Orbits: Normal globe. Extraocular muscles normal. Optic  nerve normal. No mass lesion in the orbit. Normal enhancement of the orbit. Optic chiasm normal. Pituitary not enlarged. Cavernous sinus normal bilaterally. Visualized sinuses: Minimal mucosal edema. Soft tissues: No soft tissue mass or edema in the face IMPRESSION: No acute intracranial abnormality. Small chronic infarcts in the right thalamus and right posterior medulla. Negative MRI orbit with contrast Electronically Signed   By: CFranchot GalloM.D.   On: 01/12/2020 15:47   CARDIAC CATHETERIZATION  Result Date: 01/13/2020  Ost RCA to Prox RCA lesion is 20% stenosed.  1.  Near normal coronary arteries with no evidence of obstructive disease. 2.  Left ventricular angiography was not performed due to chronic kidney disease.  EF was normal by echo. 3.  Right heart catheterization showed normal right and left-sided filling pressures, severe pulmonary hypertension and mildly reduced cardiac output. RA: 3 mmHg RV: 81/4 mmHg PA: 82/21 with a mean of 44 mmHg. Pulmonary capillary wedge pressure is 14 mmHg with normal waveforms. LVEDP: 11 mmHg. Cardiac output is 3.93 with a cardiac index of 2.2. Pulmonary vascular resistance: 7.38 [place] units Recommendations: The patient has precapillary pulmonary hypertension likely WHO Group 1. She does not require any diuretics at this point. I will refer to advanced pulmonary hypertension clinic for further evaluation and management.   MR ORBITS W WO CONTRAST  Result Date: 01/12/2020 CLINICAL DATA:  Monocular vision loss. EXAM: MRI HEAD AND ORBITS WITHOUT AND WITH CONTRAST TECHNIQUE: Multiplanar, multiecho pulse sequences of the brain and surrounding structures were obtained without and with intravenous contrast. Multiplanar, multiecho pulse sequences of the orbits and surrounding structures were obtained including fat saturation techniques, before and after intravenous contrast administration. CONTRAST:  792mGADAVIST GADOBUTROL 1 MMOL/ML IV SOLN COMPARISON:  MRI head 04/16/2018  FINDINGS: MRI HEAD FINDINGS Brain: Negative for acute infarct. Small chronic infarct right thalamus and right posterior medulla. Ventricle size normal. Negative for hemorrhage or mass lesion Normal enhancement following contrast infusion. Vascular: Normal arterial flow voids Skull and upper cervical spine: No focal skeletal lesion. Other: None MRI  ORBITS FINDINGS Orbits: Normal globe. Extraocular muscles normal. Optic nerve normal. No mass lesion in the orbit. Normal enhancement of the orbit. Optic chiasm normal. Pituitary not enlarged. Cavernous sinus normal bilaterally. Visualized sinuses: Minimal mucosal edema. Soft tissues: No soft tissue mass or edema in the face IMPRESSION: No acute intracranial abnormality. Small chronic infarcts in the right thalamus and right posterior medulla. Negative MRI orbit with contrast Electronically Signed   By: Franchot Gallo M.D.   On: 01/12/2020 15:47    EKG:   Orders placed or performed during the hospital encounter of 01/09/20  . ED EKG  . ED EKG  . EKG 12-Lead  . EKG 12-Lead      Management plans discussed with the patient, family and they are in agreement.  CODE STATUS:     Code Status Orders  (From admission, onward)         Start     Ordered   01/09/20 0913  Full code  Continuous        01/09/20 0915        Code Status History    Date Active Date Inactive Code Status Order ID Comments User Context   08/09/2016 2359 08/11/2016 1556 Full Code [id number]  Harvie Bridge, DO Inpatient   12/22/2015 0816 12/24/2015 1352 Full Code [id number]  Demetrios Loll, MD Inpatient   Advance Care Planning Activity      TOTAL TIME TAKING CARE OF THIS PATIENT: 35 minutes.    Loletha Grayer M.D on 01/13/2020 at 4:42 PM  Between 7am to 6pm - Pager - [phone]  After 6pm go to [email or web address] - password [email or web address]  [facility]  CC: Primary care physician; Perrin Maltese, MD

## 2020-01-13 NOTE — Progress Notes (Signed)
Pt left unit with transport for cardiac cath.

## 2020-01-13 NOTE — Progress Notes (Signed)
Patient transport back to room with update given to [facility] RN, no bleeding nor hematoma at radial site. Dressing dry/intact.vitals stable.

## 2020-01-14 ENCOUNTER — Encounter: Payer: Self-pay | Admitting: Cardiovascular Disease

## 2020-01-14 LAB — RHEUMATOID FACTOR: Rheumatoid fact SerPl-aCnc: 52 IU/mL — ABNORMAL HIGH (ref 0.0–13.9)

## 2020-01-14 LAB — ANTI-SMITH ANTIBODY: ENA SM Ab Ser-aCnc: <0.2 AI (ref 0.0–0.9)

## 2020-01-14 LAB — ANA W/REFLEX: Anti Nuclear Antibody (ANA): NEGATIVE

## 2020-01-14 LAB — ANTI-SCLERODERMA ANTIBODY: Scleroderma (Scl-70) (ENA) Antibody, IgG: <0.2 AI (ref 0.0–0.9)

## 2020-01-14 NOTE — Progress Notes (Signed)
Patient ID: Jamie Murray, female   DOB: 08-Feb-1946, 74 y.o.   MRN: [id number]  Patient's rheumatoid factor was elevated at 52.  I did call the patient and gave her the phone number of Dr. Jefm Bryant rheumatologist at the [facility].  The patient states that she is feeling better.  She states that her sugars have been high still.  If they are still high by tomorrow can go up on the Levemir insulin to 20 units.  Can also go up on the short acting insulin to 6 to 8 units prior to meals.  The patient states that they did not give her her sleeping medication that she takes at home upon discharge.  She stated she will call the nursing station to see where they have the medication.  It may be in the pharmacy at this point.  Dr Loletha Grayer

## 2020-01-15 ENCOUNTER — Telehealth (HOSPITAL_COMMUNITY): Payer: Self-pay | Admitting: *Deleted

## 2020-01-15 LAB — ANCA TITERS
Atypical P-ANCA titer: <1:20 {titer}
C-ANCA: <1:20 {titer}
P-ANCA: <1:20 {titer}

## 2020-01-15 LAB — CYCLIC CITRUL PEPTIDE ANTIBODY, IGG/IGA: CCP Antibodies IgG/IgA: 6 units (ref 0–19)

## 2020-01-15 NOTE — Telephone Encounter (Signed)
Pts daughter called stating she called the scheduling line to set up a post hospital appointment with our clinic and was denied because there was no referral. Daughter said pt was admitted to [facility] for heart cath and needs an appt asap for pulm hypertension.  Routed to [employer] for approval

## 2020-01-16 ENCOUNTER — Telehealth (HOSPITAL_COMMUNITY): Payer: Self-pay | Admitting: Vascular Surgery

## 2020-01-16 NOTE — Telephone Encounter (Signed)
Left pt message to make new pulm appt w/ [facility] @ 8:40 AM 8/27

## 2020-01-20 NOTE — Telephone Encounter (Signed)
appt sch for 8/27

## 2020-01-31 ENCOUNTER — Ambulatory Visit (HOSPITAL_COMMUNITY)
Admission: RE | Admit: 2020-01-31 | Discharge: 2020-01-31 | Disposition: A | Discharge status: Home / Self Care | Payer: Medicare PPO | Source: Ambulatory Visit | Attending: Cardiology | Admitting: Cardiology

## 2020-01-31 ENCOUNTER — Telehealth (HOSPITAL_COMMUNITY): Payer: Self-pay | Admitting: Pharmacist

## 2020-01-31 ENCOUNTER — Other Ambulatory Visit: Payer: Self-pay

## 2020-01-31 VITALS — BP 158/70 | HR 51 | Wt 158.2 lb

## 2020-01-31 DIAGNOSIS — E119 Type 2 diabetes mellitus without complications: Secondary | ICD-10-CM | POA: Insufficient documentation

## 2020-01-31 DIAGNOSIS — K219 Gastro-esophageal reflux disease without esophagitis: Secondary | ICD-10-CM | POA: Insufficient documentation

## 2020-01-31 DIAGNOSIS — R011 Cardiac murmur, unspecified: Secondary | ICD-10-CM | POA: Diagnosis not present

## 2020-01-31 DIAGNOSIS — I2721 Secondary pulmonary arterial hypertension: Secondary | ICD-10-CM | POA: Diagnosis not present

## 2020-01-31 DIAGNOSIS — Z8249 Family history of ischemic heart disease and other diseases of the circulatory system: Secondary | ICD-10-CM | POA: Diagnosis not present

## 2020-01-31 DIAGNOSIS — I50810 Right heart failure, unspecified: Secondary | ICD-10-CM | POA: Diagnosis not present

## 2020-01-31 DIAGNOSIS — Z79890 Hormone replacement therapy (postmenopausal): Secondary | ICD-10-CM | POA: Insufficient documentation

## 2020-01-31 DIAGNOSIS — J984 Other disorders of lung: Secondary | ICD-10-CM | POA: Diagnosis not present

## 2020-01-31 DIAGNOSIS — J849 Interstitial pulmonary disease, unspecified: Secondary | ICD-10-CM | POA: Diagnosis not present

## 2020-01-31 DIAGNOSIS — E039 Hypothyroidism, unspecified: Secondary | ICD-10-CM | POA: Insufficient documentation

## 2020-01-31 DIAGNOSIS — Z833 Family history of diabetes mellitus: Secondary | ICD-10-CM | POA: Diagnosis not present

## 2020-01-31 DIAGNOSIS — I4510 Unspecified right bundle-branch block: Secondary | ICD-10-CM | POA: Insufficient documentation

## 2020-01-31 DIAGNOSIS — I272 Pulmonary hypertension, unspecified: Secondary | ICD-10-CM | POA: Diagnosis not present

## 2020-01-31 DIAGNOSIS — Z79899 Other long term (current) drug therapy: Secondary | ICD-10-CM | POA: Diagnosis not present

## 2020-01-31 DIAGNOSIS — Z7982 Long term (current) use of aspirin: Secondary | ICD-10-CM | POA: Diagnosis not present

## 2020-01-31 DIAGNOSIS — Z794 Long term (current) use of insulin: Secondary | ICD-10-CM | POA: Insufficient documentation

## 2020-01-31 DIAGNOSIS — G4733 Obstructive sleep apnea (adult) (pediatric): Secondary | ICD-10-CM | POA: Insufficient documentation

## 2020-01-31 DIAGNOSIS — I10 Essential (primary) hypertension: Secondary | ICD-10-CM | POA: Diagnosis not present

## 2020-01-31 DIAGNOSIS — Z9989 Dependence on other enabling machines and devices: Secondary | ICD-10-CM | POA: Diagnosis not present

## 2020-01-31 LAB — BASIC METABOLIC PANEL
Anion gap: 14 (ref 5–15)
BUN: 24 mg/dL — ABNORMAL HIGH (ref 8–23)
CO2: 22 mmol/L (ref 22–32)
Calcium: 9.7 mg/dL (ref 8.9–10.3)
Chloride: 105 mmol/L (ref 98–111)
Creatinine, Ser: 1.59 mg/dL — ABNORMAL HIGH (ref 0.44–1.00)
GFR calc Af Amer: 37 mL/min — ABNORMAL LOW (ref >60)
GFR calc non Af Amer: 32 mL/min — ABNORMAL LOW (ref >60)
Glucose, Bld: 132 mg/dL — ABNORMAL HIGH (ref 70–99)
Potassium: 3.2 mmol/L — ABNORMAL LOW (ref 3.5–5.1)
Sodium: 141 mmol/L (ref 135–145)

## 2020-01-31 LAB — BRAIN NATRIURETIC PEPTIDE: B Natriuretic Peptide: 1325.1 pg/mL — ABNORMAL HIGH (ref 0.0–100.0)

## 2020-01-31 MED ORDER — AMLODIPINE BESYLATE 10 MG PO TABS: 10.0000 mg | ORAL_TABLET | Freq: Every day | ORAL | 6 refills | Status: DC

## 2020-01-31 MED ORDER — AMBRISENTAN 5 MG PO TABS: 5.0000 mg | ORAL_TABLET | Freq: Every day | ORAL | 11 refills | Status: DC

## 2020-01-31 MED ORDER — POTASSIUM CHLORIDE ER 10 MEQ PO TBCR
10.0000 meq | EXTENDED_RELEASE_TABLET | Freq: Every day | ORAL | 6 refills | Status: DC
Start: 2020-01-31 — End: 2020-02-11

## 2020-01-31 MED ORDER — FUROSEMIDE 20 MG PO TABS: 20.0000 mg | ORAL_TABLET | Freq: Every day | ORAL | 6 refills | Status: DC

## 2020-01-31 MED ORDER — TADALAFIL (PAH) 20 MG PO TABS: 20.0000 mg | ORAL_TABLET | Freq: Every day | ORAL | 11 refills | Status: DC

## 2020-01-31 NOTE — Telephone Encounter (Signed)
Patient Advocate Encounter   Received notification from [facility] that prior authorizations for tadalafil and ambrisentan are required.   PA submitted on CoverMyMeds Key [id number]; [id number] Status is pending  [name], PharmD, BCPS, BCCP, CPP Heart Failure Clinic Pharmacist [phone]

## 2020-01-31 NOTE — Patient Instructions (Addendum)
INCREASE Amlodipine 36m (1 tab) daily  STOP IMDUR  START Ambrisentan 555m(1 tab) daily  START Tadalafil 2014m1 tab)  10 days after you start Ambrisentan.   TAKE Lasix 1m64mily  REPEAT labs in 10 days ([facility] [place])  Follow up in 1 month

## 2020-01-31 NOTE — Progress Notes (Signed)
LATE ENTRY: 6 min walk test completed  6 Min Walk Test Completed  Pt ambulated 510 ft (155.4 m) O2 Sat ranged 93-100 % on RA HR ranged 56-80

## 2020-01-31 NOTE — Telephone Encounter (Addendum)
Sent in Ambrisentan prescription to [facility]. Faxed [employer] patient application to Ambrisentan [employer]. Patient received Ambrisentan Guide for Female Patients. Of note, patient is post-menopausal.   [name], PharmD, BCPS, BCCP, CPP Heart Failure Clinic Pharmacist [phone]

## 2020-01-31 NOTE — Telephone Encounter (Signed)
Advanced Heart Failure Patient Advocate Encounter  Prior Authorization for Ambrisentan has been approved.    Effective dates: 01/31/20 through 06/05/20  [name], PharmD, BCPS, BCCP, CPP Heart Failure Clinic Pharmacist [phone]

## 2020-02-01 NOTE — Progress Notes (Signed)
PCP: Perrin Maltese, MD HF Cardiology: Dr. [name]  74 y.o. with history of HTN, type 2 diabetes, OSA on CPAP, and pulmonary hypertension/RV failure was referred by Dr. Fletcher Anon for evaluation of pulmonary hypertension. It appears that [place] was diagnosed as early as 2018 by echo.  Echo in 6578 showed PA systolic pressure 85 mmHg.  She says that she was told that the pulmonary hypertension was due to OSA.  She has been compliant with CPAP. She reports exertional dyspnea for about 4 years now.   In 8/21, she was admitted to [facility] with CHF.  Echo showed EF 55-60%, mild LVH, D-shaped septum, severe RV dilation with moderate RV systolic dysfunction, PASP 83, moderate TR, moderate PI, dilated IVC.  RHC/LHC showed no coronary disease, severe pulmonary hypertension with PVR 7.38 and preserved cardiac output.    Currently, she reports dyspnea walking around her house.  She is unable to walk long distances and was pushed in today in a wheelchair. No lightheadedness/syncope.  No chest pain.  No palpitations.  No orthopnea.  She fatigues easily.  Minimal joint pains, no rashes, no dysphagia, no diarrhea.   ECG (8/21): NSR, RBBB  Labs (8/21): K 3.3, creatinine 1.39, ANA negative, anti-Sm negative, anti-SCL-70 negative, ANCA negative, RF elevated 52 but CCP negative, ESR 67, CRP 2.2  6 minute walk (8/21): 154 m  PMH: 1. HTN 2. Type 2 diabetes 3. OSA: Uses CPAP.  4. GERD 5. Hypothyroidism 6. Pulmonary hypertension/RV failure: Pulmonary hypertension diagnosed 2018 by echo.  - PFTs (6/18): moderate restriction - Echo 2020 with PASP 85 mmHg.  - Echo (8/21): EF 55-60%, mild LVH, D-shaped septum, severe RV dilation with moderate RV systolic dysfunction, PASP 83, moderate TR, moderate PI, dilated IVC.  - LHC/RHC (8/21): Normal coronaries; mean RA 3, PA 82/21 mean 44, mean PCWP 14, CI 2.2, PVR 7.38 WU.  - V/Q scan (8/21): No evidence for chronic PE.  - Serologic workup: ANA negative, anti-Sm negative, anti-SCL-70  negative, ANCA negative, RF elevated 52 but CCP negative, ESR 67, CRP 2.2  Social History   Socioeconomic History  . Marital status: Married    Spouse name: [name]  . Number of children: 3  . Years of education: 11  . Highest education level: Not on file  Occupational History    Comment: work part time, day care  Tobacco Use  . Smoking status: Never Smoker  . Smokeless tobacco: Never Used  Vaping Use  . Vaping Use: Never used  Substance and Sexual Activity  . Alcohol use: No    Alcohol/week: 0.0 standard drinks  . Drug use: No  . Sexual activity: Never  Other Topics Concern  . Not on file  Social History Narrative   Lives with husband   Caffeine use- coffee 2 cups daily, Coke once a week, green tea daily   Social Determinants of Health   Financial Resource Strain:   . Difficulty of Paying Living Expenses: Not on file  Food Insecurity:   . Worried About Charity fundraiser in the Last Year: Not on file  . Ran Out of Food in the Last Year: Not on file  Transportation Needs:   . Lack of Transportation (Medical): Not on file  . Lack of Transportation (Non-Medical): Not on file  Physical Activity:   . Days of Exercise per Week: Not on file  . Minutes of Exercise per Session: Not on file  Stress:   . Feeling of Stress : Not on file  Social  Connections:   . Frequency of Communication with Friends and Family: Not on file  . Frequency of Social Gatherings with Friends and Family: Not on file  . Attends Religious Services: Not on file  . Active Member of Clubs or Organizations: Not on file  . Attends Archivist Meetings: Not on file  . Marital Status: Not on file  Intimate Partner Violence:   . Fear of Current or Ex-Partner: Not on file  . Emotionally Abused: Not on file  . Physically Abused: Not on file  . Sexually Abused: Not on file   Family History  Problem Relation Age of Onset  . Diabetes Mother   . Hypertension Mother   . Stroke Mother   . Heart  disease Mother        MI  . Cancer Father        pancreatic  . Stroke Father   . Hypertension Sister   . Diabetes Brother   . Diabetes Maternal Grandfather   . Diabetes Sister   . Diabetes Sister   . Diabetes Brother   . Diabetes Brother    ROS: All systems reviewed and negative except as per HPI.   Current Outpatient Medications  Medication Sig Dispense Refill  . amLODipine (NORVASC) 10 MG tablet Take 1 tablet (10 mg total) by mouth daily. 30 tablet 6  . aspirin 81 MG tablet Take 81 mg by mouth daily.    . dapagliflozin propanediol (FARXIGA) 10 MG TABS tablet Take 10 mg by mouth daily.    . Fe Fum-FA-B Cmp-C-Zn-Mg-Mn-Cu (HEMOCYTE PLUS) 106-1 MG CAPS Take 1 capsule by mouth daily.  6  . furosemide (LASIX) 20 MG tablet Take 1 tablet (20 mg total) by mouth daily. 30 tablet 6  . hydrALAZINE (APRESOLINE) 50 MG tablet Take 1 tablet (50 mg total) by mouth every 8 (eight) hours. 90 tablet 0  . insulin aspart (NOVOLOG) 100 UNIT/ML FlexPen Inject 4 Units into the skin 3 (three) times daily with meals. 15 mL 0  . insulin detemir (LEVEMIR) 100 UNIT/ML FlexPen Inject 17 Units into the skin every evening. 15 mL 0  . Insulin Pen Needle 31G X 5 MM MISC 1 Dose by Does not apply route 4 (four) times daily - after meals and at bedtime. 100 each 3  . levothyroxine (SYNTHROID) 75 MCG tablet Take 75 mcg by mouth daily before breakfast.    . loratadine (CLARITIN) 10 MG tablet Take 10 mg by mouth daily.    . meclizine (ANTIVERT) 25 MG tablet Take 25 mg by mouth 3 (three) times daily as needed for dizziness.    . metoprolol succinate (TOPROL-XL) 50 MG 24 hr tablet Take 50 mg by mouth daily. Take with or immediately following a meal.    . Multiple Vitamins-Minerals (MULTI ADULT GUMMIES) CHEW Chew by mouth daily.    . nitroGLYCERIN (NITROSTAT) 0.4 MG SL tablet Place 0.4 mg under the tongue every 5 (five) minutes as needed for chest pain.    Marland Kitchen VIIBRYD 20 MG TABS Take 1 tablet by mouth daily.    . zaleplon  (SONATA) 10 MG capsule Take 10 mg by mouth at bedtime.    Marland Kitchen albuterol (VENTOLIN HFA) 108 (90 Base) MCG/ACT inhaler Inhale 2 puffs into the lungs every 6 (six) hours as needed for wheezing or shortness of breath.    Marland Kitchen ambrisentan (LETAIRIS) 5 MG tablet Take 1 tablet (5 mg total) by mouth daily. 30 tablet 11  . atorvastatin (LIPITOR) 10 MG tablet Take  1 tablet (10 mg total) by mouth daily. 90 tablet 3  . potassium chloride (KLOR-CON) 10 MEQ tablet Take 1 tablet (10 mEq total) by mouth daily. 30 tablet 6  . tadalafil, PAH, (ADCIRCA) 20 MG tablet Take 1 tablet (20 mg total) by mouth daily. 30 tablet 11   No current facility-administered medications for this encounter.   BP (!) 158/70   Pulse (!) 51   Wt 71.8 kg (158 lb 3.2 oz)   LMP  (LMP Unknown)   SpO2 100%   BMI 24.78 kg/m  General: NAD Neck: JVP 8 cm, no thyromegaly or thyroid nodule.  Lungs: Clear to auscultation bilaterally with normal respiratory effort. CV: Nondisplaced PMI.  Heart regular S1/S2, no N0/U7, 2/6 diastolic murmur along left sternal border.  Trace ankle edema.  No carotid bruit.  Normal pedal pulses.  Abdomen: Soft, nontender, no hepatosplenomegaly, no distention.  Skin: Intact without lesions or rashes.  Neurologic: Alert and oriented x 3.  Psych: Normal affect. Extremities: No clubbing or cyanosis.  HEENT: Normal.   Assessment/Plan: 1. HTN: Systemic BP elevated.  - Increase amlodipine to 10 mg daily.  2. OSA: Continue CPAP.  3. Pulmonary HTN/RV failure: Echo in 8/21 with EF 55-60%, mild LVH, D-shaped septum, severe RV dilation with moderate RV systolic dysfunction, PASP 83, moderate TR, moderate PI, dilated IVC.  Suspect long-standing RV dysfunction based on appearance of echo. [place] in 8/21 with severe pulmonary arterial hypertension, preserved cardiac output. PFTs from 6/18 showed moderate restriction.  V/Q scan from 8/21 showed no evidence for chronic PE. She has treated OSA.  Serologic workup was negative except  for elevated CRP and ESR and elevated RF (normal CCP).  I suspect long-standing group 1 PAH. On exam today, she is mildly volume overloaded.  NYHA class III symptoms, gradually worsening.  - Start Lasix 20 mg daily with KCl 10 daily. Check BMET/BNP today and BMET in 10 days.  - I will start her on ambrisentan 5 mg daily, will then start combination therapy with Adcirca 20 mg daily after about a week. Eventually, would also like to add on Uptravi.  - D/c Imdur with initiation of Adcirca.  - Baseline 6 minute walk done today.  - I will arrange for high resolution CT chest given restrictive PFTs to rule out ILD.  - Complete serologic workup with anti-centromere antibody. She has an appointment with a rheumatologist in [place] given elevated RF.  - Continue CPAP (OSA likely contributes but certainly cannot explain the extent of her [place]).  4. Pulmonary insufficiency: 8/21 echo with PI reportedly severe.  I reviewed, looks moderate.  She does have a diastolic murmur.  Suspect related to severe PAH and hopefully will improve with decreasing PA pressure.   Followup with HF pharmacist in 2 wks for [facility] medication titration, see me in 1 month.   Loralie Champagne 02/01/2020

## 2020-02-03 DIAGNOSIS — R600 Localized edema: Secondary | ICD-10-CM | POA: Insufficient documentation

## 2020-02-03 DIAGNOSIS — D472 Monoclonal gammopathy: Secondary | ICD-10-CM | POA: Insufficient documentation

## 2020-02-03 HISTORY — DX: Localized edema: R60.0

## 2020-02-03 NOTE — Telephone Encounter (Signed)
Advanced Heart Failure Patient Advocate Encounter  Prior Authorization for tadalafil has been approved.    Effective dates: 01/31/20 through 06/05/20  [name], PharmD, BCPS, BCCP, CPP Heart Failure Clinic Pharmacist [phone]

## 2020-02-06 ENCOUNTER — Telehealth (HOSPITAL_COMMUNITY): Payer: Self-pay | Admitting: Pharmacy Technician

## 2020-02-06 NOTE — Telephone Encounter (Signed)
I received a PA request for Tadalafil [facility]) for the patient. [facility]), has already sent that in and received an approval.  I called [place] to make sure that they are running the right [facility] for this medication. The technician I spoke with advised me that if they had any issues getting it to go through they would call me directly. Provided my phone number.  Charlann Boxer, CPhT

## 2020-02-11 ENCOUNTER — Telehealth (HOSPITAL_COMMUNITY): Payer: Self-pay | Admitting: Cardiology

## 2020-02-11 MED ORDER — POTASSIUM CHLORIDE ER 10 MEQ PO TBCR
10.0000 meq | EXTENDED_RELEASE_TABLET | Freq: Every day | ORAL | 6 refills | Status: DC
Start: 2020-02-11 — End: 2020-03-03

## 2020-02-11 NOTE — Telephone Encounter (Signed)
Patients daughter called requesting update on specialty medications  Advised of application, PA, and appeal (if needed) process.  Advised turn around time can vary however tadalafil was approved and available for pick up at her local pharmacy  ambrisertan may still be in process-application sent on medication company/specialty pharmacy Advised would send to pharmacy team as they may have detailed information regarding medication  Daughter would like a returned call for update  [phone]

## 2020-02-12 NOTE — Telephone Encounter (Signed)
[facility] to request an update on status of ambrisentan prescription. Representative informed me that prescription is in counseling stages. Once they speak to the patient, ambrisentan will be shipped out to patient's home.  Tadalafil is pending pick up at her local [facility] tomorrow (on order at pharmacy). Patient aware.    [name], PharmD, BCPS, BCCP, CPP Heart Failure Clinic Pharmacist [phone]

## 2020-02-13 ENCOUNTER — Other Ambulatory Visit: Payer: Self-pay | Admitting: Cardiology

## 2020-02-14 LAB — BASIC METABOLIC PANEL
BUN/Creatinine Ratio: 18 (ref 12–28)
BUN: 33 mg/dL — ABNORMAL HIGH (ref 8–27)
CO2: 21 mmol/L (ref 20–29)
Calcium: 9.8 mg/dL (ref 8.7–10.3)
Chloride: 102 mmol/L (ref 96–106)
Creatinine, Ser: 1.8 mg/dL — ABNORMAL HIGH (ref 0.57–1.00)
GFR calc Af Amer: 32 mL/min/{1.73_m2} — ABNORMAL LOW (ref >59)
GFR calc non Af Amer: 28 mL/min/{1.73_m2} — ABNORMAL LOW (ref >59)
Glucose: 202 mg/dL — ABNORMAL HIGH (ref 65–99)
Potassium: 4.5 mmol/L (ref 3.5–5.2)
Sodium: 140 mmol/L (ref 134–144)

## 2020-02-19 ENCOUNTER — Telehealth (HOSPITAL_COMMUNITY): Payer: Self-pay

## 2020-02-19 DIAGNOSIS — I272 Pulmonary hypertension, unspecified: Secondary | ICD-10-CM

## 2020-02-19 MED ORDER — FUROSEMIDE 20 MG PO TABS: 20.0000 mg | ORAL_TABLET | ORAL | 6 refills | Status: DC

## 2020-02-19 NOTE — Telephone Encounter (Signed)
-----  Message from Larey Dresser, MD sent at 02/18/2020 10:12 PM EDT ----- With elevated creatinine, decrease Lasix to every other day. BMET 10 days.

## 2020-02-19 NOTE — Telephone Encounter (Signed)
Patient advised and verbalized understanding. Med list updated to reflect changes. Patient has a pending appt in 2 weeks. Will recheck blood at that time. Lab order entered.   Meds ordered this encounter  Medications  . furosemide (LASIX) 20 MG tablet    Sig: Take 1 tablet (20 mg total) by mouth every other day.    Dispense:  30 tablet    Refill:  6   Orders Placed This Encounter  Procedures  . Basic Metabolic Panel (BMET)    Standing Status:   Future    Standing Expiration Date:   02/18/2021    Order Specific Question:   Release to patient    Answer:   Immediate

## 2020-03-02 ENCOUNTER — Ambulatory Visit (HOSPITAL_COMMUNITY)
Admission: RE | Admit: 2020-03-02 | Discharge: 2020-03-02 | Disposition: A | Discharge status: Home / Self Care | Payer: Medicare PPO | Source: Ambulatory Visit | Attending: Cardiology | Admitting: Cardiology

## 2020-03-02 ENCOUNTER — Other Ambulatory Visit: Payer: Self-pay

## 2020-03-02 DIAGNOSIS — J849 Interstitial pulmonary disease, unspecified: Secondary | ICD-10-CM | POA: Insufficient documentation

## 2020-03-03 ENCOUNTER — Telehealth (HOSPITAL_COMMUNITY): Payer: Self-pay

## 2020-03-03 ENCOUNTER — Ambulatory Visit (HOSPITAL_COMMUNITY)
Admission: RE | Admit: 2020-03-03 | Discharge: 2020-03-03 | Disposition: A | Discharge status: Home / Self Care | Payer: Medicare PPO | Source: Ambulatory Visit | Attending: Cardiology | Admitting: Cardiology

## 2020-03-03 ENCOUNTER — Telehealth (HOSPITAL_COMMUNITY): Payer: Self-pay | Admitting: Pharmacist

## 2020-03-03 ENCOUNTER — Telehealth (HOSPITAL_COMMUNITY): Payer: Self-pay | Admitting: Pharmacy Technician

## 2020-03-03 ENCOUNTER — Encounter (HOSPITAL_COMMUNITY): Payer: Self-pay | Admitting: Cardiology

## 2020-03-03 VITALS — BP 144/76 | HR 62 | Ht 67.0 in | Wt 154.8 lb

## 2020-03-03 DIAGNOSIS — I5032 Chronic diastolic (congestive) heart failure: Secondary | ICD-10-CM | POA: Insufficient documentation

## 2020-03-03 DIAGNOSIS — I50810 Right heart failure, unspecified: Secondary | ICD-10-CM

## 2020-03-03 DIAGNOSIS — K219 Gastro-esophageal reflux disease without esophagitis: Secondary | ICD-10-CM | POA: Diagnosis not present

## 2020-03-03 DIAGNOSIS — I2721 Secondary pulmonary arterial hypertension: Secondary | ICD-10-CM | POA: Diagnosis not present

## 2020-03-03 DIAGNOSIS — Z794 Long term (current) use of insulin: Secondary | ICD-10-CM | POA: Diagnosis not present

## 2020-03-03 DIAGNOSIS — Z7982 Long term (current) use of aspirin: Secondary | ICD-10-CM | POA: Diagnosis not present

## 2020-03-03 DIAGNOSIS — N183 Chronic kidney disease, stage 3 unspecified: Secondary | ICD-10-CM | POA: Diagnosis not present

## 2020-03-03 DIAGNOSIS — Z79890 Hormone replacement therapy (postmenopausal): Secondary | ICD-10-CM | POA: Diagnosis not present

## 2020-03-03 DIAGNOSIS — Z79899 Other long term (current) drug therapy: Secondary | ICD-10-CM | POA: Insufficient documentation

## 2020-03-03 DIAGNOSIS — I130 Hypertensive heart and chronic kidney disease with heart failure and stage 1 through stage 4 chronic kidney disease, or unspecified chronic kidney disease: Secondary | ICD-10-CM | POA: Diagnosis present

## 2020-03-03 DIAGNOSIS — Z8249 Family history of ischemic heart disease and other diseases of the circulatory system: Secondary | ICD-10-CM | POA: Insufficient documentation

## 2020-03-03 DIAGNOSIS — I272 Pulmonary hypertension, unspecified: Secondary | ICD-10-CM

## 2020-03-03 DIAGNOSIS — Z9989 Dependence on other enabling machines and devices: Secondary | ICD-10-CM | POA: Diagnosis not present

## 2020-03-03 DIAGNOSIS — G4733 Obstructive sleep apnea (adult) (pediatric): Secondary | ICD-10-CM | POA: Diagnosis not present

## 2020-03-03 DIAGNOSIS — J984 Other disorders of lung: Secondary | ICD-10-CM | POA: Diagnosis not present

## 2020-03-03 DIAGNOSIS — E1122 Type 2 diabetes mellitus with diabetic chronic kidney disease: Secondary | ICD-10-CM | POA: Insufficient documentation

## 2020-03-03 DIAGNOSIS — E039 Hypothyroidism, unspecified: Secondary | ICD-10-CM | POA: Insufficient documentation

## 2020-03-03 DIAGNOSIS — R011 Cardiac murmur, unspecified: Secondary | ICD-10-CM | POA: Diagnosis not present

## 2020-03-03 LAB — BASIC METABOLIC PANEL
Anion gap: 5 (ref 5–15)
BUN: 25 mg/dL — ABNORMAL HIGH (ref 8–23)
CO2: 22 mmol/L (ref 22–32)
Calcium: 9.6 mg/dL (ref 8.9–10.3)
Chloride: 114 mmol/L — ABNORMAL HIGH (ref 98–111)
Creatinine, Ser: 1.45 mg/dL — ABNORMAL HIGH (ref 0.44–1.00)
GFR calc Af Amer: 41 mL/min — ABNORMAL LOW (ref >60)
GFR calc non Af Amer: 36 mL/min — ABNORMAL LOW (ref >60)
Glucose, Bld: 116 mg/dL — ABNORMAL HIGH (ref 70–99)
Potassium: 3.9 mmol/L (ref 3.5–5.1)
Sodium: 141 mmol/L (ref 135–145)

## 2020-03-03 LAB — BRAIN NATRIURETIC PEPTIDE: B Natriuretic Peptide: 309.6 pg/mL — ABNORMAL HIGH (ref 0.0–100.0)

## 2020-03-03 MED ORDER — POTASSIUM CHLORIDE ER 10 MEQ PO TBCR: 10.0000 meq | EXTENDED_RELEASE_TABLET | ORAL | 6 refills | Status: DC

## 2020-03-03 MED ORDER — AMBRISENTAN 10 MG PO TABS: 10.0000 mg | ORAL_TABLET | Freq: Every day | ORAL | 11 refills | Status: DC

## 2020-03-03 NOTE — Telephone Encounter (Signed)
Pt  Complete a full 6 min, walk with Oxygen level at 92 % at  6 laps .

## 2020-03-03 NOTE — Patient Instructions (Addendum)
INCREASE Letairis to 67m (1 tab) daily   CHANGE Potassium to 10 meq (1 tab) every other day   MAKE SURE YOU ARE NOT TAKING IMDUR (ISOSORBIDE)   Labs today We will only contact you if something comes back abnormal or we need to make some changes. Otherwise no news is good news!   Your physician recommends that you schedule a follow-up appointment in: 2 weeks with Pharmacy for medication titration and 6 weeks with Dr MAundra Dubin   Please call office at [phone] 2 if you have any questions or concerns.   At the [facility] you and your health needs are our priority. As part of our continuing mission to provide you with exceptional heart care, we have created designated Provider Care Teams. These Care Teams include your primary Cardiologist (physician) and Advanced Practice Providers (APPs- Physician Assistants and Nurse Practitioners) who all work together to provide you with the care you need, when you need it.   You may see any of the following providers on your designated Care Team at your next follow up: .Marland KitchenDr DGlori Bickers. Dr DLoralie Champagne. ADarrick Grinder NP . BLyda Jester PA . LAudry Riles PharmD   Please be sure to bring in all your medications bottles to every appointment.

## 2020-03-03 NOTE — Progress Notes (Signed)
PCP: [name], FNP HF Cardiology: Dr. [name]  74 y.o. with history of HTN, type 2 diabetes, OSA on CPAP, and pulmonary hypertension/RV failure was referred by Dr. Fletcher Anon for evaluation of pulmonary hypertension. It appears that [place] was diagnosed as early as 2018 by echo.  Echo in 1324 showed PA systolic pressure 85 mmHg.  She says that she was told that the pulmonary hypertension was due to OSA.  She has been compliant with CPAP. She reports exertional dyspnea for about 4 years now.   In 8/21, she was admitted to [facility] with CHF.  Echo showed EF 55-60%, mild LVH, D-shaped septum, severe RV dilation with moderate RV systolic dysfunction, PASP 83, moderate TR, moderate PI, dilated IVC.  RHC/LHC showed no coronary disease, severe pulmonary hypertension with PVR 7.38 and preserved cardiac output.  High resolution CT chest in 9/21 showed no evidence for fibrotic ILD.    She saw rheumatology and was given no definite rheumatological diagnosis.    She returns for followup of pulmonary hypertension. She is now on ambrisentan 5 and tadalafil 20. No dyspnea walking around her house but still short of breath with moderate exertion.  Breathing overall mildly improved.  Occasional mild lightheadedness, no particular trigger. No falls or loss of consciousness.  No chest pain. She is taking Lasix every other day. Weight down 4 lbs.   Labs (8/21): K 3.3, creatinine 1.39, ANA negative, anti-Sm negative, anti-SCL-70 negative, ANCA negative, RF elevated 52 but CCP negative, ESR 67, CRP 2.2 Labs (9/21): K 4.5, creatinine 1.8  6 minute walk (8/21): 154 m  PMH: 1. HTN 2. Type 2 diabetes 3. OSA: Uses CPAP.  4. GERD 5. Hypothyroidism 6. Pulmonary hypertension/RV failure: Pulmonary hypertension diagnosed 2018 by echo.  - PFTs (6/18): moderate restriction - Echo 2020 with PASP 85 mmHg.  - Echo (8/21): EF 55-60%, mild LVH, D-shaped septum, severe RV dilation with moderate RV systolic dysfunction, PASP 83,  moderate TR, moderate PI, dilated IVC.  - LHC/RHC (8/21): Normal coronaries; mean RA 3, PA 82/21 mean 44, mean PCWP 14, CI 2.2, PVR 7.38 WU.  - V/Q scan (8/21): No evidence for chronic PE.  - Serologic workup: ANA negative, anti-Sm negative, anti-SCL-70 negative, ANCA negative, RF elevated 52 but CCP negative, ESR 67, CRP 2.2 - High resolution CT chest: No fibrotic ILD.  7. CKD stage 3.   Social History   Socioeconomic History  . Marital status: Married    Spouse name: [name]  . Number of children: 3  . Years of education: 54  . Highest education level: Not on file  Occupational History    Comment: work part time, day care  Tobacco Use  . Smoking status: Never Smoker  . Smokeless tobacco: Never Used  Vaping Use  . Vaping Use: Never used  Substance and Sexual Activity  . Alcohol use: No    Alcohol/week: 0.0 standard drinks  . Drug use: No  . Sexual activity: Never  Other Topics Concern  . Not on file  Social History Narrative   Lives with husband   Caffeine use- coffee 2 cups daily, Coke once a week, green tea daily   Social Determinants of Health   Financial Resource Strain:   . Difficulty of Paying Living Expenses: Not on file  Food Insecurity:   . Worried About Charity fundraiser in the Last Year: Not on file  . Ran Out of Food in the Last Year: Not on file  Transportation Needs:   .  Lack of Transportation (Medical): Not on file  . Lack of Transportation (Non-Medical): Not on file  Physical Activity:   . Days of Exercise per Week: Not on file  . Minutes of Exercise per Session: Not on file  Stress:   . Feeling of Stress : Not on file  Social Connections:   . Frequency of Communication with Friends and Family: Not on file  . Frequency of Social Gatherings with Friends and Family: Not on file  . Attends Religious Services: Not on file  . Active Member of Clubs or Organizations: Not on file  . Attends Archivist Meetings: Not on file  . Marital Status:  Not on file  Intimate Partner Violence:   . Fear of Current or Ex-Partner: Not on file  . Emotionally Abused: Not on file  . Physically Abused: Not on file  . Sexually Abused: Not on file   Family History  Problem Relation Age of Onset  . Diabetes Mother   . Hypertension Mother   . Stroke Mother   . Heart disease Mother        MI  . Cancer Father        pancreatic  . Stroke Father   . Hypertension Sister   . Diabetes Brother   . Diabetes Maternal Grandfather   . Diabetes Sister   . Diabetes Sister   . Diabetes Brother   . Diabetes Brother    ROS: All systems reviewed and negative except as per HPI.   Current Outpatient Medications  Medication Sig Dispense Refill  . ambrisentan (LETAIRIS) 10 MG tablet Take 1 tablet (10 mg total) by mouth daily. 30 tablet 11  . amLODipine (NORVASC) 10 MG tablet Take 1 tablet (10 mg total) by mouth daily. 30 tablet 6  . aspirin 81 MG tablet Take 81 mg by mouth daily.    . dapagliflozin propanediol (FARXIGA) 10 MG TABS tablet Take 10 mg by mouth daily.    . Fe Fum-FA-B Cmp-C-Zn-Mg-Mn-Cu (HEMOCYTE PLUS) 106-1 MG CAPS Take 1 capsule by mouth daily.  6  . furosemide (LASIX) 20 MG tablet Take 1 tablet (20 mg total) by mouth every other day. 30 tablet 6  . hydrALAZINE (APRESOLINE) 50 MG tablet Take 1 tablet (50 mg total) by mouth every 8 (eight) hours. 90 tablet 0  . insulin aspart (NOVOLOG) 100 UNIT/ML FlexPen Inject 4 Units into the skin 3 (three) times daily with meals. 15 mL 0  . insulin glargine, 1 Unit Dial, (TOUJEO SOLOSTAR) 300 UNIT/ML Solostar Pen Inject into the skin. 30 units    . Insulin Pen Needle 31G X 5 MM MISC 1 Dose by Does not apply route 4 (four) times daily - after meals and at bedtime. 100 each 3  . levothyroxine (SYNTHROID) 75 MCG tablet Take 75 mcg by mouth daily before breakfast.    . metoprolol succinate (TOPROL-XL) 50 MG 24 hr tablet Take 50 mg by mouth daily. Take with or immediately following a meal.    . Multiple  Vitamins-Minerals (MULTI ADULT GUMMIES) CHEW Chew by mouth daily.    . potassium chloride (KLOR-CON) 10 MEQ tablet Take 1 tablet (10 mEq total) by mouth every other day. 15 tablet 6  . tadalafil, PAH, (ADCIRCA) 20 MG tablet Take 1 tablet (20 mg total) by mouth daily. 30 tablet 11  . VIIBRYD 20 MG TABS Take 1 tablet by mouth daily.    . zaleplon (SONATA) 10 MG capsule Take 10 mg by mouth at bedtime.    Marland Kitchen  albuterol (VENTOLIN HFA) 108 (90 Base) MCG/ACT inhaler Inhale 2 puffs into the lungs every 6 (six) hours as needed for wheezing or shortness of breath. (Patient not taking: Reported on 03/03/2020)    . atorvastatin (LIPITOR) 10 MG tablet Take 1 tablet (10 mg total) by mouth daily. (Patient not taking: Reported on 03/03/2020) 90 tablet 3  . insulin detemir (LEVEMIR) 100 UNIT/ML FlexPen Inject 17 Units into the skin every evening. (Patient not taking: Reported on 03/03/2020) 15 mL 0  . loratadine (CLARITIN) 10 MG tablet Take 10 mg by mouth daily. (Patient not taking: Reported on 03/03/2020)    . meclizine (ANTIVERT) 25 MG tablet Take 25 mg by mouth 3 (three) times daily as needed for dizziness. (Patient not taking: Reported on 03/03/2020)    . nitroGLYCERIN (NITROSTAT) 0.4 MG SL tablet Place 0.4 mg under the tongue every 5 (five) minutes as needed for chest pain. (Patient not taking: Reported on 03/03/2020)     No current facility-administered medications for this encounter.   BP (!) 144/76 (BP Location: Left Arm, Patient Position: Sitting, Cuff Size: Normal)   Pulse 62   Ht _0  (1.702 m)   Wt 70.2 kg (154 lb 12.8 oz)   LMP  (LMP Unknown)   SpO2 99%   BMI 24.25 kg/m  General: NAD Neck: No JVD, no thyromegaly or thyroid nodule.  Lungs: Clear to auscultation bilaterally with normal respiratory effort. CV: Nondisplaced PMI.  Heart regular S1/S2, no S3/S4, no murmur.  No peripheral edema.  No carotid bruit.  Normal pedal pulses.  Abdomen: Soft, nontender, no hepatosplenomegaly, no distention.  Skin:  Intact without lesions or rashes.  Neurologic: Alert and oriented x 3.  Psych: Normal affect. Extremities: No clubbing or cyanosis.  HEENT: Normal.   Assessment/Plan: 1. HTN: Systemic BP mildly elevated.  - No changes today. Eventually start losartan if creatinine remains stable.  2. OSA: Continue CPAP.  3. Pulmonary HTN/RV failure: Echo in 8/21 with EF 55-60%, mild LVH, D-shaped septum, severe RV dilation with moderate RV systolic dysfunction, PASP 83, moderate TR, moderate PI, dilated IVC.  Suspect long-standing RV dysfunction based on appearance of echo. [place] in 8/21 with severe pulmonary arterial hypertension, preserved cardiac output. PFTs from 6/18 showed moderate restriction.  V/Q scan from 8/21 showed no evidence for chronic PE. She has treated OSA.  Serologic workup was negative except for elevated CRP and ESR and elevated RF (normal CCP).  High resolution CT chest did not show evidence for ILD. I suspect long-standing group 1 PAH. On exam today, she is not volume overloaded.  NYHA class III symptoms, mildly better. - Continue Lasix 20 mg qod, check BMET and BNP.  - Increase ambrisentan to 10 mg daily today.  I will have her see the clinical pharmacist in 2 wks and will increase tadalafil to 40 mg at that time. Eventually, would also like to add on Uptravi.  - 6 minute walk today.  - Complete serologic workup with anti-centromere antibody. She saw rheumatology with no rheum diagnosis given.   - Continue CPAP (OSA likely contributes but certainly cannot explain the extent of her [place]).  4. Pulmonary insufficiency: 8/21 echo with PI reportedly severe.  I reviewed, looks moderate.  She does have a diastolic murmur.  Suspect related to severe PAH and hopefully will improve with decreasing PA pressure.   Followup with HF pharmacist in 2 wks for [facility] medication titration, see me in 6 wks.   Loralie Champagne 03/03/2020

## 2020-03-03 NOTE — Telephone Encounter (Signed)
Sent in [employer] Assistance application to [employer] for Farxiga.    Application pending, will continue to follow.   [name], PharmD, BCPS, BCCP, CPP Heart Failure Clinic Pharmacist [phone]

## 2020-03-03 NOTE — Telephone Encounter (Signed)
The patient's current 30 day Ambrisentan co-pay is $100. I was able to obtain a $10,000 [place] to cover that co-pay.  Pharmacy Billing Information  BIN: [id number]  PCN: [id number]  Group: [id number]  ID: [id number]  Effective Dates - 02/01/20-01/31/21  Provided [facility] specialty pharmacy the billing information. Called and left the patient a message.  Charlann Boxer, CPhT

## 2020-03-04 ENCOUNTER — Other Ambulatory Visit (HOSPITAL_COMMUNITY): Payer: Self-pay | Admitting: Cardiology

## 2020-03-04 LAB — CENTROMERE ANTIBODIES: Centromere Ab Screen: <0.2 AI (ref 0.0–0.9)

## 2020-03-09 ENCOUNTER — Telehealth (HOSPITAL_COMMUNITY): Payer: Self-pay | Admitting: Pharmacy Technician

## 2020-03-09 NOTE — Telephone Encounter (Signed)
Despite successful confirmation, [employer] states they have not received the patient's application.   Will re-fax and follow up.

## 2020-03-16 NOTE — Progress Notes (Signed)
PCP: [name], FNP HF Cardiology: Dr. [name]  HPI:  74 y.o. with history of HTN, type 2 diabetes, OSA on CPAP, and pulmonary hypertension/RV failure was referred by Dr. Fletcher Anon for evaluation of pulmonary hypertension. It appears that [place] was diagnosed as early as 2018 by echo.  Echo in 8022 showed PA systolic pressure 85 mmHg.  She says that she was told that the pulmonary hypertension was due to OSA.  She has been compliant with CPAP. She reports exertional dyspnea for about 4 years now.   In 8/21, she was admitted to [facility] with CHF.  Echo showed EF 55-60%, mild LVH, D-shaped septum, severe RV dilation with moderate RV systolic dysfunction, PASP 83, moderate TR, moderate PI, dilated IVC.  RHC/LHC showed no coronary disease, severe pulmonary hypertension with PVR 7.38 and preserved cardiac output.  High resolution CT chest in 9/21 showed no evidence for fibrotic ILD.    She saw rheumatology and was given no definite rheumatological diagnosis.    She returned to clinic for follow up on 03/03/20 for pulmonary hypertension with Dr. [name]. She was taking ambrisentan 5 mg daily and tadalafil 20 mg daily. No dyspnea walking around her house but still short of breath with moderate exertion.  Breathing was overall mildly improved.  Occasional mild lightheadedness, no particular trigger. No falls or loss of consciousness.  No chest pain. She was taking furosemide every other day. Weight was down 4 lbs.   Today she returns to HF clinic for pharmacist medication titration. At last visit with MD, ambrisentan was increased to 10 mg daily. Overall patient is feeling well today. Only complaint is that her BG have been running lower recently. She was recently seen by an endocrinologist who is now managing her diabetes. Only notes dizziness/lightheadedness with low BG. Breathing is improved but still gets SOB with moderate exertion. Weight has increased since starting insulin, was 163 lbs in clinic today,  normally 155-158 lbs. States she has been eating more. No LEE, PND or orthopnea. Takes furosemide 20 mg every other day and has not needed any extra. Taking all medications as prescribed and tolerating all medications.    PAH Medications: Ambrisentan 10 mg daily Tadalafil 20 mg daily  Has the patient been experiencing any side effects to the medications prescribed?  no  Does the patient have any problems obtaining medications due to transportation or finances?   [employer] Medicare. Has [employer] grant for ambrisentan. Approved for Farxiga patient assistance.   Understanding of regimen: good Understanding of indications: good Potential of compliance: good Patient understands to avoid NSAIDs. Patient understands to avoid decongestants.    Pertinent Lab Values:  Serum creatinine 1.45, BUN 25, Potassium 3.9, Sodium 141, BNP 309.6  Vital Signs:  Weight: 163.6 lbs (last clinic weight: 154.8 lbs)  Blood pressure: 118/58   Heart rate: 59   Assessment: 1. HTN: controlled today at 118/58 - No changes today 2. OSA: Continue CPAP.  3. Pulmonary HTN/RV failure: Echo in 8/21 with EF 55-60%, mild LVH, D-shaped septum, severe RV dilation with moderate RV systolic dysfunction, PASP 83, moderate TR, moderate PI, dilated IVC.  Suspect long-standing RV dysfunction based on appearance of echo. [place] in 8/21 with severe pulmonary arterial hypertension, preserved cardiac output. PFTs from 6/18 showed moderate restriction.  V/Q scan from 8/21 showed no evidence for chronic PE. She has treated OSA.  Serologic workup was negative except for elevated CRP and ESR and elevated RF (normal CCP).  High resolution CT chest did not show  evidence for ILD. Suspected long-standing group 1 PAH.  -On exam today, she is not volume overloaded. - NYHA class III symptoms, mildly better. - Continue furosemide 20 mg every other day - Continue ambrisentan 10 mg daily today.  - Increase tadalafil to 40 mg daily  - Complete  serologic workup with anti-centromere antibody. She saw rheumatology with no rheum diagnosis given.   - Continue CPAP (OSA likely contributes but certainly cannot explain the extent of her [place]).  4. Pulmonary insufficiency: 8/21 echo with PI reportedly severe.  Per Dr. [name], looks moderate.  She does have a diastolic murmur.  Suspect related to severe PAH and hopefully will improve with decreasing PA pressure.    Plan: 1) Medication changes: Based on clinical presentation, vital signs and recent labs will increase tadalafil to 40 mg daily.  2) Follow-up: 4 weeks with Dr. Rush Farmer, PharmD, BCPS, [facility], CPP Heart Failure Clinic Pharmacist [phone]

## 2020-03-17 ENCOUNTER — Other Ambulatory Visit: Payer: Self-pay

## 2020-03-17 ENCOUNTER — Ambulatory Visit (HOSPITAL_COMMUNITY)
Admission: RE | Admit: 2020-03-17 | Discharge: 2020-03-17 | Disposition: A | Discharge status: Home / Self Care | Payer: Medicare PPO | Source: Ambulatory Visit | Attending: Internal Medicine | Admitting: Internal Medicine

## 2020-03-17 VITALS — BP 118/58 | HR 59 | Wt 163.6 lb

## 2020-03-17 DIAGNOSIS — I272 Pulmonary hypertension, unspecified: Secondary | ICD-10-CM | POA: Insufficient documentation

## 2020-03-17 DIAGNOSIS — I10 Essential (primary) hypertension: Secondary | ICD-10-CM | POA: Diagnosis not present

## 2020-03-17 DIAGNOSIS — J984 Other disorders of lung: Secondary | ICD-10-CM | POA: Insufficient documentation

## 2020-03-17 DIAGNOSIS — G4733 Obstructive sleep apnea (adult) (pediatric): Secondary | ICD-10-CM | POA: Diagnosis not present

## 2020-03-17 DIAGNOSIS — Z79899 Other long term (current) drug therapy: Secondary | ICD-10-CM | POA: Diagnosis not present

## 2020-03-17 MED ORDER — TADALAFIL (PAH) 20 MG PO TABS: 40.0000 mg | ORAL_TABLET | Freq: Every day | ORAL | 11 refills | Status: DC

## 2020-03-17 NOTE — Patient Instructions (Signed)
It was a pleasure seeing you today!  MEDICATIONS: -We are changing your medications today -Increase tadalafil to 40 mg (2 tablets) daily.  -Call if you have questions about your medications.   NEXT APPOINTMENT: Return to clinic in 4 weeks with Pharmacy Clinic.  In general, to take care of your heart failure: -Limit your fluid intake to 2 Liters (half-gallon) per day.   -Limit your salt intake to ideally 2-3 grams (2000-3000 mg) per day. -Weigh yourself daily and record, and bring that "weight diary" to your next appointment.  (Weight gain of 2-3 pounds in 1 day typically means fluid weight.) -The medications for your heart are to help your heart and help you live longer.   -Please contact us before stopping any of your heart medications.  Call the clinic at [phone] with questions or to reschedule future appointments.

## 2020-03-17 NOTE — Telephone Encounter (Signed)
Advanced Heart Failure Patient Advocate Encounter   Patient was approved to receive Farxiga from [employer]  Effective dates: 03/13/20 through 06/05/20  The pharmacy has received the request to ship the medication as of yesterday. It should take 7-10 business days for the patient to receive.  Charlann Boxer, CPhT

## 2020-03-18 ENCOUNTER — Telehealth (HOSPITAL_COMMUNITY): Payer: Self-pay | Admitting: Pharmacy Technician

## 2020-03-18 NOTE — Telephone Encounter (Signed)
Was given a notification from [facility] that patient needed a PA for Tadalafil. Called and spoke with the patients pharmacy and they were able to get the claim to go through.  Charlann Boxer, CPhT

## 2020-04-17 ENCOUNTER — Telehealth (HOSPITAL_COMMUNITY): Payer: Self-pay | Admitting: Pharmacy Technician

## 2020-04-17 ENCOUNTER — Encounter (HOSPITAL_COMMUNITY): Payer: Self-pay | Admitting: Cardiology

## 2020-04-17 ENCOUNTER — Other Ambulatory Visit: Payer: Self-pay

## 2020-04-17 ENCOUNTER — Telehealth (HOSPITAL_COMMUNITY): Payer: Self-pay | Admitting: Pharmacist

## 2020-04-17 ENCOUNTER — Ambulatory Visit (HOSPITAL_COMMUNITY)
Admission: RE | Admit: 2020-04-17 | Discharge: 2020-04-17 | Disposition: A | Discharge status: Home / Self Care | Payer: Medicare PPO | Source: Ambulatory Visit | Attending: Cardiology | Admitting: Cardiology

## 2020-04-17 VITALS — BP 140/80 | HR 85 | Wt 160.2 lb

## 2020-04-17 DIAGNOSIS — I2721 Secondary pulmonary arterial hypertension: Secondary | ICD-10-CM | POA: Diagnosis not present

## 2020-04-17 DIAGNOSIS — R7982 Elevated C-reactive protein (CRP): Secondary | ICD-10-CM | POA: Insufficient documentation

## 2020-04-17 DIAGNOSIS — E1122 Type 2 diabetes mellitus with diabetic chronic kidney disease: Secondary | ICD-10-CM | POA: Insufficient documentation

## 2020-04-17 DIAGNOSIS — Z79890 Hormone replacement therapy (postmenopausal): Secondary | ICD-10-CM | POA: Diagnosis not present

## 2020-04-17 DIAGNOSIS — Z794 Long term (current) use of insulin: Secondary | ICD-10-CM | POA: Diagnosis not present

## 2020-04-17 DIAGNOSIS — Z7982 Long term (current) use of aspirin: Secondary | ICD-10-CM | POA: Insufficient documentation

## 2020-04-17 DIAGNOSIS — I5032 Chronic diastolic (congestive) heart failure: Secondary | ICD-10-CM

## 2020-04-17 DIAGNOSIS — I50810 Right heart failure, unspecified: Secondary | ICD-10-CM | POA: Diagnosis not present

## 2020-04-17 DIAGNOSIS — N183 Chronic kidney disease, stage 3 unspecified: Secondary | ICD-10-CM | POA: Diagnosis not present

## 2020-04-17 DIAGNOSIS — G4733 Obstructive sleep apnea (adult) (pediatric): Secondary | ICD-10-CM | POA: Insufficient documentation

## 2020-04-17 DIAGNOSIS — I129 Hypertensive chronic kidney disease with stage 1 through stage 4 chronic kidney disease, or unspecified chronic kidney disease: Secondary | ICD-10-CM | POA: Insufficient documentation

## 2020-04-17 DIAGNOSIS — I272 Pulmonary hypertension, unspecified: Secondary | ICD-10-CM

## 2020-04-17 DIAGNOSIS — R700 Elevated erythrocyte sedimentation rate: Secondary | ICD-10-CM | POA: Insufficient documentation

## 2020-04-17 DIAGNOSIS — Z9989 Dependence on other enabling machines and devices: Secondary | ICD-10-CM | POA: Diagnosis not present

## 2020-04-17 DIAGNOSIS — R011 Cardiac murmur, unspecified: Secondary | ICD-10-CM | POA: Diagnosis not present

## 2020-04-17 DIAGNOSIS — I2729 Other secondary pulmonary hypertension: Secondary | ICD-10-CM | POA: Insufficient documentation

## 2020-04-17 DIAGNOSIS — J984 Other disorders of lung: Secondary | ICD-10-CM | POA: Diagnosis not present

## 2020-04-17 DIAGNOSIS — Z79899 Other long term (current) drug therapy: Secondary | ICD-10-CM | POA: Insufficient documentation

## 2020-04-17 LAB — BASIC METABOLIC PANEL
Anion gap: 12 (ref 5–15)
BUN: 27 mg/dL — ABNORMAL HIGH (ref 8–23)
CO2: 21 mmol/L — ABNORMAL LOW (ref 22–32)
Calcium: 9.3 mg/dL (ref 8.9–10.3)
Chloride: 107 mmol/L (ref 98–111)
Creatinine, Ser: 1.47 mg/dL — ABNORMAL HIGH (ref 0.44–1.00)
GFR, Estimated: 37 mL/min — ABNORMAL LOW (ref >60)
Glucose, Bld: 84 mg/dL (ref 70–99)
Potassium: 4.1 mmol/L (ref 3.5–5.1)
Sodium: 140 mmol/L (ref 135–145)

## 2020-04-17 LAB — BRAIN NATRIURETIC PEPTIDE: B Natriuretic Peptide: 590.1 pg/mL — ABNORMAL HIGH (ref 0.0–100.0)

## 2020-04-17 MED ORDER — UPTRAVI 200 MCG PO TABS
ORAL_TABLET | ORAL | 5 refills | Status: DC
Start: 2020-04-17 — End: 2020-08-14

## 2020-04-17 NOTE — Patient Instructions (Addendum)
START Jamie Murray as directed when approved by pharmacy.  Our office will be in contact with you about this.  You have been referred to pulmonary rehab at [facility].  They will call you to schedule your appointment.   Labs today We will only contact you if something comes back abnormal or we need to make some changes. Otherwise no news is good news!  Your physician recommends that you schedule a follow-up appointment in: 2 months with Dr [name]  Please call office at [phone] option [phone] if you have any questions or concerns.   At the [facility], you and your health needs are our priority. As part of our continuing mission to provide you with exceptional heart care, we have created designated Provider Care Teams. These Care Teams include your primary Cardiologist (physician) and Advanced Practice Providers (APPs- Physician Assistants and Nurse Practitioners) who all work together to provide you with the care you need, when you need it.   You may see any of the following providers on your designated Care Team at your next follow up: Marland Kitchen Dr Glori Bickers . Dr Loralie Champagne . Darrick Grinder, NP . Lyda Jester, PA . [name], PharmD   Please be sure to bring in all your medications bottles to every appointment.

## 2020-04-17 NOTE — Telephone Encounter (Signed)
Sent in patient's enrollment form to [employer] for [employer] via fax.  Will follow up.

## 2020-04-17 NOTE — Progress Notes (Signed)
Dr. [name] made aware of patient's pulse ox on room air.Starting   Sat 96% with ambulation lowest was 94%

## 2020-04-17 NOTE — Addendum Note (Signed)
Encounter addended by: Stanford Scotland, RN on: 04/17/2020 1:08 PM  Actions taken: Clinical Note Signed

## 2020-04-17 NOTE — Telephone Encounter (Signed)
Patient Advocate Encounter   Received notification from [facility] that prior authorization for Jamie Murray is required.   PA submitted on CoverMyMeds Key [id number] Status is pending   Will continue to follow.   [name], PharmD, BCPS, BCCP, CPP Heart Failure Clinic Pharmacist [phone]

## 2020-04-17 NOTE — Addendum Note (Signed)
Encounter addended by: Valeda Malm, RN on: 04/17/2020 1:01 PM  Actions taken: Clinical Note Signed

## 2020-04-17 NOTE — Telephone Encounter (Signed)
Advanced Heart Failure Patient Advocate Encounter  Prior Authorization for Uptravi (273mg and titration pack) has been approved.    Effective dates: 04/17/20 through 06/05/2021  ECharlann Boxer CPhT

## 2020-04-19 NOTE — Progress Notes (Signed)
PCP: [name], FNP HF Cardiology: Dr. [name]  74 y.o. with history of HTN, type 2 diabetes, OSA on CPAP, and pulmonary hypertension/RV failure was referred by Dr. Fletcher Anon for evaluation of pulmonary hypertension. It appears that [place] was diagnosed as early as 2018 by echo.  Echo in 6962 showed PA systolic pressure 85 mmHg.  She says that she was told that the pulmonary hypertension was due to OSA.  She has been compliant with CPAP. She reports exertional dyspnea for about 4 years now.   In 8/21, she was admitted to [facility] with CHF.  Echo showed EF 55-60%, mild LVH, D-shaped septum, severe RV dilation with moderate RV systolic dysfunction, PASP 83, moderate TR, moderate PI, dilated IVC.  RHC/LHC showed no coronary disease, severe pulmonary hypertension with PVR 7.38 and preserved cardiac output.  High resolution CT chest in 9/21 showed no evidence for fibrotic ILD.    She saw rheumatology and was given no definite rheumatological diagnosis.    She returns for followup of pulmonary hypertension. She is now on ambrisentan 10 and tadalafil 40.  No dyspnea walking around the house but does get short of breath walking longer distances.  Improved 6 minute walk today.  No falls or loss of consciousness.  No chest pain. Weight is up about 6 lbs.  No orthopnea/PND.  BP is elevated today.  Oxygen saturation today 95% with ambulation. Using CPAP at night.   Labs (8/21): K 3.3, creatinine 1.39, ANA negative, anti-Sm negative, anti-SCL-70 negative, ANCA negative, RF elevated 52 but CCP negative, ESR 67, CRP 2.2 Labs (9/21): K 4.5, creatinine 1.8 => 1.45, anti-centromere Ab negative, BNP 309  6 minute walk (8/21): 154 m 6 minute walk (11/21): 183 m  ECG (personally reviewed): NSR, inferior and lateral TWIs, RVH  PMH: 1. HTN 2. Type 2 diabetes 3. OSA: Uses CPAP.  4. GERD 5. Hypothyroidism 6. Pulmonary hypertension/RV failure: Pulmonary hypertension diagnosed 2018 by echo.  - PFTs (6/18): moderate  restriction - Echo 2020 with PASP 85 mmHg.  - Echo (8/21): EF 55-60%, mild LVH, D-shaped septum, severe RV dilation with moderate RV systolic dysfunction, PASP 83, moderate TR, moderate PI, dilated IVC.  - LHC/RHC (8/21): Normal coronaries; mean RA 3, PA 82/21 mean 44, mean PCWP 14, CI 2.2, PVR 7.38 WU.  - V/Q scan (8/21): No evidence for chronic PE.  - Serologic workup: ANA negative, anti-Sm negative, anti-SCL-70 negative, ANCA negative, RF elevated 52 but CCP negative, ESR 67, CRP 2.2, anti-centromere negative - High resolution CT chest: No fibrotic ILD.  7. CKD stage 3.   Social History   Socioeconomic History  . Marital status: Married    Spouse name: Jamie Murray  . Number of children: 3  . Years of education: 22  . Highest education level: Not on file  Occupational History    Comment: work part time, day care  Tobacco Use  . Smoking status: Never Smoker  . Smokeless tobacco: Never Used  Vaping Use  . Vaping Use: Never used  Substance and Sexual Activity  . Alcohol use: No    Alcohol/week: 0.0 standard drinks  . Drug use: No  . Sexual activity: Never  Other Topics Concern  . Not on file  Social History Narrative   Lives with husband   Caffeine use- coffee 2 cups daily, Coke once a week, green tea daily   Social Determinants of Health   Financial Resource Strain:   . Difficulty of Paying Living Expenses: Not on file  Food  Insecurity:   . Worried About Charity fundraiser in the Last Year: Not on file  . Ran Out of Food in the Last Year: Not on file  Transportation Needs:   . Lack of Transportation (Medical): Not on file  . Lack of Transportation (Non-Medical): Not on file  Physical Activity:   . Days of Exercise per Week: Not on file  . Minutes of Exercise per Session: Not on file  Stress:   . Feeling of Stress : Not on file  Social Connections:   . Frequency of Communication with Friends and Family: Not on file  . Frequency of Social Gatherings with Friends and  Family: Not on file  . Attends Religious Services: Not on file  . Active Member of Clubs or Organizations: Not on file  . Attends Archivist Meetings: Not on file  . Marital Status: Not on file  Intimate Partner Violence:   . Fear of Current or Ex-Partner: Not on file  . Emotionally Abused: Not on file  . Physically Abused: Not on file  . Sexually Abused: Not on file   Family History  Problem Relation Age of Onset  . Diabetes Mother   . Hypertension Mother   . Stroke Mother   . Heart disease Mother        MI  . Cancer Father        pancreatic  . Stroke Father   . Hypertension Sister   . Diabetes Brother   . Diabetes Maternal Grandfather   . Diabetes Sister   . Diabetes Sister   . Diabetes Brother   . Diabetes Brother    ROS: All systems reviewed and negative except as per HPI.   Current Outpatient Medications  Medication Sig Dispense Refill  . ambrisentan (LETAIRIS) 10 MG tablet Take 1 tablet (10 mg total) by mouth daily. 30 tablet 11  . amLODipine (NORVASC) 10 MG tablet Take 1 tablet (10 mg total) by mouth daily. 30 tablet 6  . aspirin 81 MG tablet Take 81 mg by mouth daily.    . dapagliflozin propanediol (FARXIGA) 10 MG TABS tablet Take 10 mg by mouth daily.    . Fe Fum-FA-B Cmp-C-Zn-Mg-Mn-Cu (HEMOCYTE PLUS) 106-1 MG CAPS Take 1 capsule by mouth daily.  6  . furosemide (LASIX) 20 MG tablet Take 1 tablet (20 mg total) by mouth every other day. 30 tablet 6  . hydrALAZINE (APRESOLINE) 50 MG tablet Take 1 tablet (50 mg total) by mouth every 8 (eight) hours. 90 tablet 0  . insulin aspart (NOVOLOG) 100 UNIT/ML FlexPen Inject 4 Units into the skin 3 (three) times daily with meals. 15 mL 0  . insulin glargine, 1 Unit Dial, (TOUJEO SOLOSTAR) 300 UNIT/ML Solostar Pen Inject into the skin. 30 units    . Insulin Pen Needle 31G X 5 MM MISC 1 Dose by Does not apply route 4 (four) times daily - after meals and at bedtime. 100 each 3  . levothyroxine (SYNTHROID) 75 MCG tablet  Take 75 mcg by mouth daily before breakfast.    . loratadine (CLARITIN) 10 MG tablet Take 10 mg by mouth daily.     . meclizine (ANTIVERT) 25 MG tablet Take 25 mg by mouth 3 (three) times daily as needed for dizziness.     . metoprolol succinate (TOPROL-XL) 50 MG 24 hr tablet Take 50 mg by mouth daily. Take with or immediately following a meal.    . Multiple Vitamins-Minerals (MULTI ADULT GUMMIES) CHEW Chew by mouth  daily.    . potassium chloride (KLOR-CON) 10 MEQ tablet Take 1 tablet (10 mEq total) by mouth every other day. 15 tablet 6  . tadalafil, PAH, (ADCIRCA) 20 MG tablet Take 2 tablets (40 mg total) by mouth daily. 60 tablet 11  . venlafaxine XR (EFFEXOR-XR) 75 MG 24 hr capsule Take 75 mg by mouth daily with breakfast.    . zaleplon (SONATA) 10 MG capsule Take 10 mg by mouth at bedtime.    . Selexipag (UPTRAVI) 200 MCG TABS As directed by clinic 60 tablet 5   No current facility-administered medications for this encounter.   BP 140/80   Pulse 85   Wt 72.7 kg (160 lb 3.2 oz)   LMP  (LMP Unknown)   SpO2 97% Comment: 2 l n/c  BMI 25.09 kg/m  General: NAD Neck: No JVD, no thyromegaly or thyroid nodule.  Lungs: Clear to auscultation bilaterally with normal respiratory effort. CV: Nondisplaced PMI.  Heart regular S1/S2, no C3/K1, 1/6 diastolic murmur LUSB.  No peripheral edema.  No carotid bruit.  Normal pedal pulses.  Abdomen: Soft, nontender, no hepatosplenomegaly, no distention.  Skin: Intact without lesions or rashes.  Neurologic: Alert and oriented x 3.  Psych: Normal affect. Extremities: No clubbing or cyanosis.  HEENT: Normal.   Assessment/Plan: 1. HTN: Systemic BP mildly elevated.  - No changes today, she will bring BP readings from home with her to next appt.  2. OSA: Continue CPAP.  3. Pulmonary HTN/RV failure: Echo in 8/21 with EF 55-60%, mild LVH, D-shaped septum, severe RV dilation with moderate RV systolic dysfunction, PASP 83, moderate TR, moderate PI, dilated IVC.   Suspect long-standing RV dysfunction based on appearance of echo. [place] in 8/21 with severe pulmonary arterial hypertension, preserved cardiac output. PFTs from 6/18 showed moderate restriction.  V/Q scan from 8/21 showed no evidence for chronic PE. She has treated OSA.  Serologic workup was negative except for elevated CRP and ESR and elevated RF (normal CCP).  High resolution CT chest did not show evidence for ILD. I suspect long-standing group 1 PAH. On exam today, she is not volume overloaded.  NYHA class III symptoms, mildly better.  6 minute walk with mild improvement today.  - Continue Lasix 20 mg qod, check BMET and BNP.  - Continue ambrisentan 10 and tadalafil 40.  - Start Uptravi with slow up-titration.    - Continue CPAP (OSA likely contributes but certainly cannot explain the extent of her PH).  - Refer for pulmonary rehab at [facility].  4. Pulmonary insufficiency: 8/21 echo with PI reportedly severe.  I reviewed, looks moderate.  She does have a diastolic murmur.  Suspect related to severe PAH and hopefully will improve with decreasing PA pressure.   Followup in 2 months.   Jamie Murray 04/19/2020

## 2020-04-19 NOTE — Addendum Note (Signed)
Encounter addended by: Larey Dresser, MD on: 04/19/2020 5:57 PM  Actions taken: Clinical Note Signed, Level of Service modified, Visit diagnoses modified

## 2020-04-20 ENCOUNTER — Other Ambulatory Visit: Payer: Self-pay | Admitting: *Deleted

## 2020-04-20 DIAGNOSIS — I272 Pulmonary hypertension, unspecified: Secondary | ICD-10-CM

## 2020-04-21 NOTE — Telephone Encounter (Signed)
Sent patient's referral to [facility].  Will follow up.

## 2020-04-29 ENCOUNTER — Telehealth (HOSPITAL_COMMUNITY): Payer: Self-pay | Admitting: Pharmacist

## 2020-04-29 NOTE — Telephone Encounter (Signed)
Received message from [place] regarding Ms. Bissonette's initial Uptravi visit:  Outbound call to patient to set-up Uptravi start of care visit with this nurse. DOB and street address verified. Patient stated she has too much going on right now and does not want to start Uptravi until the first of the year. She was recently diagnosed with diabetes and has dental work she needs to have done. She brought up the holidays and that everything was too rushed and overwhelming for her. She states she has not been answering calls from [facility] because she was not ready to talk to them. She states she will tell Dr. [name] and doesn't care if he agrees or not. Nurse tentatively set-up [facility] for 06/08/20 at 0930. Emotional support given and patient aware that she needs to speak to [facility] pharmacist at some point. She agreed to talk to Bartholomew mid December. Patient was adamant about when she will start Uptravi and that she will not be rushed about when she wants to start.   Will forward message to Dr. [name] so he is aware. Will have patient start [name] as soon as she is comfortable in January 2022.    [name], PharmD, BCPS, BCCP, CPP Heart Failure Clinic Pharmacist [phone]

## 2020-04-29 NOTE — Telephone Encounter (Signed)
OK.  Make sure we call her back mid December to set up January start.  Would push her appointment with me back to at least a month after starting Uptravi.

## 2020-05-07 ENCOUNTER — Telehealth (HOSPITAL_COMMUNITY): Payer: Self-pay | Admitting: Pharmacist

## 2020-05-07 NOTE — Telephone Encounter (Signed)
Received message from patient's daughter that patient is now interested in going ahead and starting Jamie Murray (previously wanted to postpone to January). Sent message to [facility] informing them that she is ready to start and asked them to reach out to patient to schedule initiation.   [name], PharmD, BCPS, BCCP, CPP Heart Failure Clinic Pharmacist [phone]

## 2020-06-13 ENCOUNTER — Other Ambulatory Visit: Payer: Medicare PPO

## 2020-06-30 ENCOUNTER — Encounter (HOSPITAL_COMMUNITY): Payer: Medicare PPO | Admitting: Cardiology

## 2020-07-03 ENCOUNTER — Telehealth (HOSPITAL_COMMUNITY): Payer: Self-pay | Admitting: Pharmacist

## 2020-07-03 ENCOUNTER — Ambulatory Visit (HOSPITAL_COMMUNITY)
Admission: RE | Admit: 2020-07-03 | Discharge: 2020-07-03 | Disposition: A | Discharge status: Home / Self Care | Payer: Medicare PPO | Source: Ambulatory Visit | Attending: Cardiology | Admitting: Cardiology

## 2020-07-03 ENCOUNTER — Other Ambulatory Visit: Payer: Self-pay

## 2020-07-03 ENCOUNTER — Encounter (HOSPITAL_COMMUNITY): Payer: Self-pay | Admitting: Cardiology

## 2020-07-03 VITALS — BP 136/70 | HR 76 | Wt 166.0 lb

## 2020-07-03 DIAGNOSIS — Z8249 Family history of ischemic heart disease and other diseases of the circulatory system: Secondary | ICD-10-CM | POA: Insufficient documentation

## 2020-07-03 DIAGNOSIS — R011 Cardiac murmur, unspecified: Secondary | ICD-10-CM | POA: Insufficient documentation

## 2020-07-03 DIAGNOSIS — R0602 Shortness of breath: Secondary | ICD-10-CM | POA: Diagnosis not present

## 2020-07-03 DIAGNOSIS — R5382 Chronic fatigue, unspecified: Secondary | ICD-10-CM | POA: Diagnosis not present

## 2020-07-03 DIAGNOSIS — N183 Chronic kidney disease, stage 3 unspecified: Secondary | ICD-10-CM | POA: Insufficient documentation

## 2020-07-03 DIAGNOSIS — G479 Sleep disorder, unspecified: Secondary | ICD-10-CM | POA: Diagnosis not present

## 2020-07-03 DIAGNOSIS — Z79899 Other long term (current) drug therapy: Secondary | ICD-10-CM | POA: Insufficient documentation

## 2020-07-03 DIAGNOSIS — E1122 Type 2 diabetes mellitus with diabetic chronic kidney disease: Secondary | ICD-10-CM | POA: Insufficient documentation

## 2020-07-03 DIAGNOSIS — F5101 Primary insomnia: Secondary | ICD-10-CM | POA: Diagnosis not present

## 2020-07-03 DIAGNOSIS — G4733 Obstructive sleep apnea (adult) (pediatric): Secondary | ICD-10-CM | POA: Insufficient documentation

## 2020-07-03 DIAGNOSIS — Z833 Family history of diabetes mellitus: Secondary | ICD-10-CM | POA: Diagnosis not present

## 2020-07-03 DIAGNOSIS — Z794 Long term (current) use of insulin: Secondary | ICD-10-CM | POA: Insufficient documentation

## 2020-07-03 DIAGNOSIS — Z7982 Long term (current) use of aspirin: Secondary | ICD-10-CM | POA: Diagnosis not present

## 2020-07-03 DIAGNOSIS — R7982 Elevated C-reactive protein (CRP): Secondary | ICD-10-CM | POA: Insufficient documentation

## 2020-07-03 DIAGNOSIS — I272 Pulmonary hypertension, unspecified: Secondary | ICD-10-CM

## 2020-07-03 DIAGNOSIS — I129 Hypertensive chronic kidney disease with stage 1 through stage 4 chronic kidney disease, or unspecified chronic kidney disease: Secondary | ICD-10-CM | POA: Insufficient documentation

## 2020-07-03 DIAGNOSIS — J984 Other disorders of lung: Secondary | ICD-10-CM | POA: Insufficient documentation

## 2020-07-03 DIAGNOSIS — I50810 Right heart failure, unspecified: Secondary | ICD-10-CM

## 2020-07-03 DIAGNOSIS — R700 Elevated erythrocyte sedimentation rate: Secondary | ICD-10-CM | POA: Insufficient documentation

## 2020-07-03 DIAGNOSIS — I2721 Secondary pulmonary arterial hypertension: Secondary | ICD-10-CM | POA: Diagnosis not present

## 2020-07-03 LAB — BASIC METABOLIC PANEL
Anion gap: 9 (ref 5–15)
BUN: 18 mg/dL (ref 8–23)
CO2: 23 mmol/L (ref 22–32)
Calcium: 9.2 mg/dL (ref 8.9–10.3)
Chloride: 109 mmol/L (ref 98–111)
Creatinine, Ser: 1.49 mg/dL — ABNORMAL HIGH (ref 0.44–1.00)
GFR, Estimated: 37 mL/min — ABNORMAL LOW (ref >60)
Glucose, Bld: 263 mg/dL — ABNORMAL HIGH (ref 70–99)
Potassium: 3.8 mmol/L (ref 3.5–5.1)
Sodium: 141 mmol/L (ref 135–145)

## 2020-07-03 LAB — LIPID PANEL
Cholesterol: 121 mg/dL (ref 0–200)
HDL: 43 mg/dL (ref >40)
LDL Cholesterol: 55 mg/dL (ref 0–99)
Total CHOL/HDL Ratio: 2.8 RATIO
Triglycerides: 115 mg/dL (ref <150)
VLDL: 23 mg/dL (ref 0–40)

## 2020-07-03 MED ORDER — TADALAFIL (PAH) 20 MG PO TABS: 20.0000 mg | ORAL_TABLET | Freq: Every day | ORAL | 11 refills | Status: DC

## 2020-07-03 MED ORDER — TADALAFIL (PAH) 20 MG PO TABS
20.0000 mg | ORAL_TABLET | Freq: Every day | ORAL | 11 refills | Status: DC
Start: 2020-07-03 — End: 2020-07-03

## 2020-07-03 MED ORDER — TADALAFIL (PAH) 20 MG PO TABS
40.0000 mg | ORAL_TABLET | Freq: Every day | ORAL | 11 refills | Status: DC
Start: 2020-07-03 — End: 2020-07-03

## 2020-07-03 NOTE — Telephone Encounter (Signed)
Advanced Heart Failure Patient Advocate Encounter  Prior Authorization for tadalafil has been approved.    PA# [id number] Effective dates: 06/06/20 through 06/05/21  [name], PharmD, BCPS, BCCP, CPP Heart Failure Clinic Pharmacist [phone]

## 2020-07-03 NOTE — Patient Instructions (Addendum)
Labs done today. We will contact you only if your labs are abnormal.  RESTART Tadalafil 50m (1 tablet) by mouth daily.  No other medication changes were made. Please continue all current medications as prescribed.  Your physician recommends that you schedule a follow-up appointment in: 2 months  You have been referred to Dr. TFransico Himat [facility] Sleep medicine. Her office will contact you to schedule an appointment.  If you have any questions or concerns before your next appointment please send [place] message through m[place]or call our office at [phone]    TO LEAVE A MESSAGE FOR THE NURSE SELECT OPTION 2, PLEASE LEAVE A MESSAGE INCLUDING: . YOUR NAME . DATE OF BIRTH . CALL BACK NUMBER . REASON FOR CALL**this is important as we prioritize the call backs  YOU WILL RECEIVE A CALL BACK THE SAME DAY AS LONG AS YOU CALL BEFORE 4:00 PM   Do the following things EVERYDAY: 1) Weigh yourself in the morning before breakfast. Write it down and keep it in a log. 2) Take your medicines as prescribed 3) Eat low salt foods-Limit salt (sodium) to 2000 mg per day.  4) Stay as active as you can everyday 5) Limit all fluids for the day to less than 2 liters   At the [facility] you and your health needs are our priority. As part of our continuing mission to provide you with exceptional heart care, we have created designated Provider Care Teams. These Care Teams include your primary Cardiologist (physician) and Advanced Practice Providers (APPs- Physician Assistants and Nurse Practitioners) who all work together to provide you with the care you need, when you need it.   You may see any of the following providers on your designated Care Team at your next follow up: .Marland KitchenDr DGlori Bickers. Dr DLoralie Champagne. ADarrick Grinder NP . BLyda Jester PA . LAudry Riles PharmD   Please be sure to bring in all your medications bottles to every appointment.

## 2020-07-05 NOTE — Progress Notes (Signed)
PCP: [name], FNP HF Cardiology: Dr. [name]  75 y.o. with history of HTN, type 2 diabetes, OSA on CPAP, and pulmonary hypertension/RV failure was referred by Dr. Fletcher Anon for evaluation of pulmonary hypertension. It appears that [place] was diagnosed as early as 2018 by echo.  Echo in 9704 showed PA systolic pressure 85 mmHg.  She says that she was told that the pulmonary hypertension was due to OSA.  She has been compliant with CPAP. She reports exertional dyspnea for about 4 years now.   In 8/21, she was admitted to [facility] with CHF.  Echo showed EF 55-60%, mild LVH, D-shaped septum, severe RV dilation with moderate RV systolic dysfunction, PASP 83, moderate TR, moderate PI, dilated IVC.  RHC/LHC showed no coronary disease, severe pulmonary hypertension with PVR 7.38 and preserved cardiac output.  High resolution CT chest in 9/21 showed no evidence for fibrotic ILD.    She saw rheumatology and was given no definite rheumatological diagnosis.    She returns for followup of pulmonary hypertension. She is now on ambrisentan 10 but has not been taking tadalafil (apparently ran out and never got refilled). She has started [place], has had some mild GI upset with it so far.  Using CPAP regularly.  She will be starting pulmonary rehab in February.  She does ok walking around the house, but still short of breath walking longer distances such as from her car into the office today.  No orthopnea/PND.  No chest pain.  Weight is up about 6 lbs.   Labs (8/21): K 3.3, creatinine 1.39, ANA negative, anti-Sm negative, anti-SCL-70 negative, ANCA negative, RF elevated 52 but CCP negative, ESR 67, CRP 2.2 Labs (9/21): K 4.5, creatinine 1.8 => 1.45, anti-centromere Ab negative, BNP 309 Labs (11/21): K 4.1, creatinine 1.47, BNP 590  6 minute walk (8/21): 154 m 6 minute walk (11/21): 183 m  PMH: 1. HTN 2. Type 2 diabetes 3. OSA: Uses CPAP.  4. GERD 5. Hypothyroidism 6. Pulmonary hypertension/RV failure: Pulmonary  hypertension diagnosed 2018 by echo.  - PFTs (6/18): moderate restriction - Echo 2020 with PASP 85 mmHg.  - Echo (8/21): EF 55-60%, mild LVH, D-shaped septum, severe RV dilation with moderate RV systolic dysfunction, PASP 83, moderate TR, moderate PI, dilated IVC.  - LHC/RHC (8/21): Normal coronaries; mean RA 3, PA 82/21 mean 44, mean PCWP 14, CI 2.2, PVR 7.38 WU.  - V/Q scan (8/21): No evidence for chronic PE.  - Serologic workup: ANA negative, anti-Sm negative, anti-SCL-70 negative, ANCA negative, RF elevated 52 but CCP negative, ESR 67, CRP 2.2, anti-centromere negative - High resolution CT chest: No fibrotic ILD.  7. CKD stage 3.   Social History   Socioeconomic History  . Marital status: Married    Spouse name: [name]  . Number of children: 3  . Years of education: 19  . Highest education level: Not on file  Occupational History    Comment: work part time, day care  Tobacco Use  . Smoking status: Never Smoker  . Smokeless tobacco: Never Used  Vaping Use  . Vaping Use: Never used  Substance and Sexual Activity  . Alcohol use: No    Alcohol/week: 0.0 standard drinks  . Drug use: No  . Sexual activity: Never  Other Topics Concern  . Not on file  Social History Narrative   Lives with husband   Caffeine use- coffee 2 cups daily, Coke once a week, green tea daily   Social Determinants of Health  Financial Resource Strain: Not on file  Food Insecurity: Not on file  Transportation Needs: Not on file  Physical Activity: Not on file  Stress: Not on file  Social Connections: Not on file  Intimate Partner Violence: Not on file   Family History  Problem Relation Age of Onset  . Diabetes Mother   . Hypertension Mother   . Stroke Mother   . Heart disease Mother        MI  . Cancer Father        pancreatic  . Stroke Father   . Hypertension Sister   . Diabetes Brother   . Diabetes Maternal Grandfather   . Diabetes Sister   . Diabetes Sister   . Diabetes Brother   .  Diabetes Brother    ROS: All systems reviewed and negative except as per HPI.   Current Outpatient Medications  Medication Sig Dispense Refill  . ambrisentan (LETAIRIS) 10 MG tablet Take 1 tablet (10 mg total) by mouth daily. 30 tablet 11  . amLODipine (NORVASC) 10 MG tablet Take 1 tablet (10 mg total) by mouth daily. 30 tablet 6  . aspirin 81 MG tablet Take 81 mg by mouth daily.    . clonazePAM (KLONOPIN) 1 MG tablet Take 1 mg by mouth daily.    . dapagliflozin propanediol (FARXIGA) 10 MG TABS tablet Take 10 mg by mouth daily.    . Fe Fum-FA-B Cmp-C-Zn-Mg-Mn-Cu (HEMOCYTE PLUS) 106-1 MG CAPS Take 1 capsule by mouth daily.  6  . furosemide (LASIX) 20 MG tablet Take 1 tablet (20 mg total) by mouth every other day. 30 tablet 6  . hydrALAZINE (APRESOLINE) 50 MG tablet Take 1 tablet (50 mg total) by mouth every 8 (eight) hours. 90 tablet 0  . insulin aspart (NOVOLOG) 100 UNIT/ML FlexPen Inject 4 Units into the skin 3 (three) times daily with meals. 15 mL 0  . insulin degludec (TRESIBA) 200 UNIT/ML FlexTouch Pen Inject 26 Units into the skin at bedtime.    Marland Kitchen levothyroxine (SYNTHROID) 75 MCG tablet Take 75 mcg by mouth daily before breakfast.    . loratadine (CLARITIN) 10 MG tablet Take 10 mg by mouth daily.     . meclizine (ANTIVERT) 25 MG tablet Take 25 mg by mouth 3 (three) times daily as needed for dizziness.     . metoprolol succinate (TOPROL-XL) 50 MG 24 hr tablet Take 50 mg by mouth daily. Take with or immediately following a meal.    . Multiple Vitamins-Minerals (MULTI ADULT GUMMIES) CHEW Chew by mouth daily.    . potassium chloride (KLOR-CON) 10 MEQ tablet Take 1 tablet (10 mEq total) by mouth every other day. 15 tablet 6  . rosuvastatin (CRESTOR) 10 MG tablet Take 10 mg by mouth daily.    . Selexipag (UPTRAVI) 200 MCG TABS As directed by clinic 60 tablet 5  . tadalafil, PAH, (ADCIRCA) 20 MG tablet Take 1 tablet (20 mg total) by mouth daily. 60 tablet 11   No current facility-administered  medications for this encounter.   BP 136/70   Pulse 76   Wt 75.3 kg (166 lb)   LMP  (LMP Unknown)   SpO2 99%   BMI 26.00 kg/m  General: NAD Neck: No JVD, no thyromegaly or thyroid nodule.  Lungs: Clear to auscultation bilaterally with normal respiratory effort. CV: Nondisplaced PMI.  Heart regular S1/S2, no S3/S4, no murmur.  No peripheral edema.  No carotid bruit.  Normal pedal pulses.  Abdomen: Soft, nontender, no hepatosplenomegaly, no  distention.  Skin: Intact without lesions or rashes.  Neurologic: Alert and oriented x 3.  Psych: Normal affect. Extremities: No clubbing or cyanosis.  HEENT: Normal.   Assessment/Plan: 1. HTN: BP controlled.  - Continue amlodipine.  2. OSA: Continue CPAP.  She needs sleep medicine followup, will arrange with Dr. Radford Pax.  3. Pulmonary HTN/RV failure: Echo in 8/21 with EF 55-60%, mild LVH, D-shaped septum, severe RV dilation with moderate RV systolic dysfunction, PASP 83, moderate TR, moderate PI, dilated IVC.  Suspect long-standing RV dysfunction based on appearance of echo. [place] in 8/21 with severe pulmonary arterial hypertension, preserved cardiac output. PFTs from 6/18 showed moderate restriction.  V/Q scan from 8/21 showed no evidence for chronic PE. She has treated OSA.  Serologic workup was negative except for elevated CRP and ESR and elevated RF (normal CCP).  High resolution CT chest did not show evidence for ILD. I suspect long-standing group 1 PAH. On exam today, she is not volume overloaded.  NYHA class III symptoms, mildly better.  She has been out of tadalafil.  - Continue Lasix 20 mg qod, check BMET and BNP.  - Continue ambrisentan 10.  - Restart tadalafil.  - Continue slow up-titration of selexipag.    - Continue CPAP (OSA likely contributes but certainly cannot explain the extent of her PH).  - Start pulmonary rehab at [facility] in 2/22.  - 6 minute walk next appt (will not do today as she has been off tadalafil).  4. Pulmonary  insufficiency: 8/21 echo with PI reportedly severe.  I reviewed, looks moderate.  She does have a diastolic murmur.  Suspect related to severe PAH and hopefully will improve with decreasing PA pressure.   Followup in 2 months.   Loralie Champagne 07/05/2020

## 2020-07-06 ENCOUNTER — Encounter: Payer: Medicare PPO | Attending: Cardiology | Admitting: *Deleted

## 2020-07-06 ENCOUNTER — Other Ambulatory Visit: Payer: Self-pay

## 2020-07-06 DIAGNOSIS — I272 Pulmonary hypertension, unspecified: Secondary | ICD-10-CM

## 2020-07-06 NOTE — Progress Notes (Signed)
Initial telephone orientation completed. Diagnosis can be found in 11/12. EP orientation scheduled for 2/8 at 1pm

## 2020-07-14 ENCOUNTER — Other Ambulatory Visit: Payer: Self-pay

## 2020-07-14 ENCOUNTER — Encounter: Payer: Medicare PPO | Attending: Cardiology

## 2020-07-14 VITALS — Ht 67.5 in | Wt 165.4 lb

## 2020-07-14 DIAGNOSIS — I272 Pulmonary hypertension, unspecified: Secondary | ICD-10-CM | POA: Diagnosis not present

## 2020-07-14 NOTE — Progress Notes (Signed)
Pulmonary Individual Treatment Plan  Patient Details  Name: Jamie Murray MRN: [id number] Date of Birth: 07/21/1945 Referring Provider:   [name] Pulmonary Rehab from 07/14/2020 in [facility] Cardiac and Pulmonary Rehab  Referring Provider [name]      Initial Encounter Date:  Flowsheet Row Pulmonary Rehab from 07/14/2020 in [facility] Cardiac and Pulmonary Rehab  Date 07/14/20      Visit Diagnosis: Pulmonary hypertension ([place])  Patient's Home Medications on Admission:  Current Outpatient Medications:  .  ambrisentan (LETAIRIS) 10 MG tablet, Take 1 tablet (10 mg total) by mouth daily., Disp: 30 tablet, Rfl: 11 .  amLODipine (NORVASC) 10 MG tablet, Take 1 tablet (10 mg total) by mouth daily., Disp: 30 tablet, Rfl: 6 .  aspirin 81 MG tablet, Take 81 mg by mouth daily., Disp: , Rfl:  .  clonazePAM (KLONOPIN) 1 MG tablet, Take 1 mg by mouth daily., Disp: , Rfl:  .  dapagliflozin propanediol (FARXIGA) 10 MG TABS tablet, Take 10 mg by mouth daily., Disp: , Rfl:  .  Fe Fum-FA-B Cmp-C-Zn-Mg-Mn-Cu (HEMOCYTE PLUS) 106-1 MG CAPS, Take 1 capsule by mouth daily., Disp: , Rfl: 6 .  furosemide (LASIX) 20 MG tablet, Take 1 tablet (20 mg total) by mouth every other day., Disp: 30 tablet, Rfl: 6 .  hydrALAZINE (APRESOLINE) 50 MG tablet, Take 1 tablet (50 mg total) by mouth every 8 (eight) hours., Disp: 90 tablet, Rfl: 0 .  insulin aspart (NOVOLOG) 100 UNIT/ML FlexPen, Inject 4 Units into the skin 3 (three) times daily with meals., Disp: 15 mL, Rfl: 0 .  insulin degludec (TRESIBA) 200 UNIT/ML FlexTouch Pen, Inject 26 Units into the skin at bedtime., Disp: , Rfl:  .  levothyroxine (SYNTHROID) 75 MCG tablet, Take 75 mcg by mouth daily before breakfast., Disp: , Rfl:  .  loratadine (CLARITIN) 10 MG tablet, Take 10 mg by mouth daily. , Disp: , Rfl:  .  meclizine (ANTIVERT) 25 MG tablet, Take 25 mg by mouth 3 (three) times daily as needed for dizziness. , Disp: , Rfl:  .  metoprolol succinate (TOPROL-XL) 50  MG 24 hr tablet, Take 50 mg by mouth daily. Take with or immediately following a meal., Disp: , Rfl:  .  Multiple Vitamins-Minerals (MULTI ADULT GUMMIES) CHEW, Chew by mouth daily., Disp: , Rfl:  .  potassium chloride (KLOR-CON) 10 MEQ tablet, Take 1 tablet (10 mEq total) by mouth every other day., Disp: 15 tablet, Rfl: 6 .  rosuvastatin (CRESTOR) 10 MG tablet, Take 10 mg by mouth daily., Disp: , Rfl:  .  Selexipag (UPTRAVI) 200 MCG TABS, As directed by clinic, Disp: 60 tablet, Rfl: 5 .  tadalafil, PAH, (ADCIRCA) 20 MG tablet, Take 1 tablet (20 mg total) by mouth daily., Disp: 60 tablet, Rfl: 11  Past Medical History: Past Medical History:  Diagnosis Date  . Allergy   . Anemia   . Anxiety   . COPD (chronic obstructive pulmonary disease) ([place])   . Depression   . Diabetes ([place])   . GERD (gastroesophageal reflux disease)   . Hyperlipidemia   . Hypertension   . Insomnia   . Migraines   . Osteoporosis   . [facility] spotted fever   . Transient cerebral ischemia   . Vertigo    every 2-3 months    Tobacco Use: Social History   Tobacco Use  Smoking Status Never Smoker  Smokeless Tobacco Never Used    Labs: Recent Review Scientist, physiological    Labs for ITP Cardiac  and Pulmonary Rehab Latest Ref Rng & Units 03/07/2016 06/07/2016 11/29/2016 01/09/2020 07/03/2020   Cholestrol 0 - 200 mg/dL 145 213(H) 95(L) 128 121   LDLCALC 0 - 99 mg/dL 77 143(H) 45 68 55   HDL >40 mg/dL 43 37(L) 29(L) 24(L) 43   Trlycerides <150 mg/dL 123 164(H) 105 178(H) 115   Hemoglobin A1c 4.8 - 5.6 % 7.8(H) 8.5(H) 7.1(H) 10.9(H) -       Pulmonary Assessment Scores:  Pulmonary Assessment Scores    Row Name 07/14/20 1440         ADL UCSD   SOB Score total 33     Rest 3     Walk 3     Stairs 5     Bath 0     Dress 0     Shop 0           CAT Score   CAT Score 19           mMRC Score   mMRC Score 2            UCSD: Self-administered rating of dyspnea associated with activities of daily living  (ADLs) 6-point scale (0 = "not at all" to 5 = "maximal or unable to do because of breathlessness")  Scoring Scores range from 0 to 120.  Minimally important difference is 5 units  CAT: CAT can identify the health impairment of COPD patients and is better correlated with disease progression.  CAT has a scoring range of zero to 40. The CAT score is classified into four groups of low (less than 10), medium (10 - 20), high (21-30) and very high (31-40) based on the impact level of disease on health status. A CAT score over 10 suggests significant symptoms.  A worsening CAT score could be explained by an exacerbation, poor medication adherence, poor inhaler technique, or progression of COPD or comorbid conditions.  CAT MCID is 2 points  mMRC: mMRC (Modified Medical Research Council) Dyspnea Scale is used to assess the degree of baseline functional disability in patients of respiratory disease due to dyspnea. No minimal important difference is established. A decrease in score of 1 point or greater is considered a positive change.   Pulmonary Function Assessment:   Exercise Target Goals: Exercise Program Goal: Individual exercise prescription set using results from initial 6 min walk test and THRR while considering  patient's activity barriers and safety.   Exercise Prescription Goal: Initial exercise prescription builds to 30-45 minutes a day of aerobic activity, 2-3 days per week.  Home exercise guidelines will be given to patient during program as part of exercise prescription that the participant will acknowledge.  Education: Aerobic Exercise: - Group verbal and visual presentation on the components of exercise prescription. Introduces F.I.T.T principle from ACSM for exercise prescriptions.  Reviews F.I.T.T. principles of aerobic exercise including progression. Written material given at graduation. Flowsheet Row Pulmonary Rehab from 06/23/2017 in [facility] Cardiac and Pulmonary Rehab  Date 06/23/17   Educator [facility] & [place]  Instruction Review Code 1- Verbalizes Understanding      Education: Resistance Exercise: - Group verbal and visual presentation on the components of exercise prescription. Introduces F.I.T.T principle from ACSM for exercise prescriptions  Reviews F.I.T.T. principles of resistance exercise including progression. Written material given at graduation.    Education: Exercise & Equipment Safety: - Individual verbal instruction and demonstration of equipment use and safety with use of the equipment. Flowsheet Row Pulmonary Rehab from 07/14/2020 in [facility] Cardiac and Pulmonary Rehab  Date 07/14/20  Educator AS  Instruction Review Code 1- Verbalizes Understanding      Education: Exercise Physiology & General Exercise Guidelines: - Group verbal and written instruction with models to review the exercise physiology of the cardiovascular system and associated critical values. Provides general exercise guidelines with specific guidelines to those with heart or lung disease.    Education: Flexibility, Balance, Mind/Body Relaxation: - Group verbal and visual presentation with interactive activity on the components of exercise prescription. Introduces F.I.T.T principle from ACSM for exercise prescriptions. Reviews F.I.T.T. principles of flexibility and balance exercise training including progression. Also discusses the mind body connection.  Reviews various relaxation techniques to help reduce and manage stress (i.e. Deep breathing, progressive muscle relaxation, and visualization). Balance handout provided to take home. Written material given at graduation.   Activity Barriers & Risk Stratification:  Activity Barriers & Cardiac Risk Stratification - 07/06/20 1534      Activity Barriers & Cardiac Risk Stratification   Activity Barriers Back Problems;Muscular Weakness;Assistive Device   cane          6 Minute Walk:  6 Minute Walk    Row Name 07/14/20 1427         6 Minute  Walk   Distance 780 feet     Walk Time 6 minutes     # of Rest Breaks 0     MPH 1.47     METS 1.9     RPE 11     Perceived Dyspnea  1     VO2 Peak 6.63     Symptoms No     Resting HR 74 bpm     Resting BP 120/60     Resting Oxygen Saturation  94 %     Exercise Oxygen Saturation  during 6 min walk 89 %     Max Ex. HR 92 bpm     Max Ex. BP 134/74     2 Minute Post BP 108/64           Interval HR   1 Minute HR 80     2 Minute HR 85     3 Minute HR 86     4 Minute HR 89     5 Minute HR 92     6 Minute HR 89     2 Minute Post HR 83     Interval Heart Rate? Yes           Interval Oxygen   Interval Oxygen? Yes     Baseline Oxygen Saturation % 94 %     1 Minute Oxygen Saturation % 98 %     1 Minute Liters of Oxygen 0 L     2 Minute Oxygen Saturation % 96 %     2 Minute Liters of Oxygen 0 L     3 Minute Oxygen Saturation % 93 %     3 Minute Liters of Oxygen 0 L     4 Minute Oxygen Saturation % 95 %     4 Minute Liters of Oxygen 0 L     5 Minute Oxygen Saturation % 89 %     5 Minute Liters of Oxygen 0 L     6 Minute Oxygen Saturation % 91 %     6 Minute Liters of Oxygen 0 L     2 Minute Post Oxygen Saturation % 96 %     2 Minute Post Liters of Oxygen 0 L  Oxygen Initial Assessment:  Oxygen Initial Assessment - 07/06/20 1604      Home Oxygen   Home Oxygen Device None    Sleep Oxygen Prescription CPAP    Home Exercise Oxygen Prescription None    Home Resting Oxygen Prescription None    Compliance with Home Oxygen Use No   issues with CPAP compliance due to congestion for last 2 months     Initial 6 min Walk   Oxygen Used None      Program Oxygen Prescription   Program Oxygen Prescription None      Intervention   Short Term Goals To learn and understand importance of monitoring SPO2 with pulse oximeter and demonstrate accurate use of the pulse oximeter.;To learn and understand importance of maintaining oxygen saturations>88%;To learn and demonstrate  proper pursed lip breathing techniques or other breathing techniques.;To learn and demonstrate proper use of respiratory medications    Long  Term Goals Verbalizes importance of monitoring SPO2 with pulse oximeter and return demonstration;Compliance with respiratory medication;Exhibits proper breathing techniques, such as pursed lip breathing or other method taught during program session;Maintenance of O2 saturations>88%           Oxygen Re-Evaluation:   Oxygen Discharge (Final Oxygen Re-Evaluation):   Initial Exercise Prescription:  Initial Exercise Prescription - 07/14/20 1400      Date of Initial Exercise RX and Referring Provider   Date 07/14/20    Referring Provider [name]      Treadmill   MPH 1.4    Grade 0    Minutes 15    METs 2      Recumbant Bike   Level 1    RPM 60    Minutes 15    METs 2      NuStep   Level 1    SPM 80    Minutes 15    METs 2      Recumbant Elliptical   Level 1    RPM 50    Minutes 15    METs 2      REL-XR   Level 1    Speed 50    Minutes 15    METs 2      Prescription Details   Frequency (times per week) 3    Duration Progress to 30 minutes of continuous aerobic without signs/symptoms of physical distress      Intensity   THRR 40-80% of Max Heartrate 103-132    Ratings of Perceived Exertion 11-13    Perceived Dyspnea 0-4      Progression   Progression Continue to progress workloads to maintain intensity without signs/symptoms of physical distress.      Resistance Training   Training Prescription Yes    Weight 2 lb    Reps 10-15           Perform Capillary Blood Glucose checks as needed.  Exercise Prescription Changes:  Exercise Prescription Changes    Row Name 07/14/20 1400             Response to Exercise   Blood Pressure (Admit) 120/60       Blood Pressure (Exercise) 134/74       Blood Pressure (Exit) 108/64       Heart Rate (Admit) 74 bpm       Heart Rate (Exercise) 92 bpm       Heart Rate (Exit)  83 bpm       Oxygen Saturation (Admit) 94 %  Oxygen Saturation (Exercise) 89 %       Oxygen Saturation (Exit) 96 %       Rating of Perceived Exertion (Exercise) 11       Perceived Dyspnea (Exercise) 1       Symptoms none              Exercise Comments:   Exercise Goals and Review:  Exercise Goals    Row Name 07/14/20 1447             Exercise Goals   Increase Physical Activity Yes       Intervention Provide advice, education, support and counseling about physical activity/exercise needs.;Develop an individualized exercise prescription for aerobic and resistive training based on initial evaluation findings, risk stratification, comorbidities and participant's personal goals.       Expected Outcomes Short Term: Attend rehab on a regular basis to increase amount of physical activity.;Long Term: Add in home exercise to make exercise part of routine and to increase amount of physical activity.;Long Term: Exercising regularly at least 3-5 days a week.       Increase Strength and Stamina Yes       Intervention Provide advice, education, support and counseling about physical activity/exercise needs.;Develop an individualized exercise prescription for aerobic and resistive training based on initial evaluation findings, risk stratification, comorbidities and participant's personal goals.       Expected Outcomes Short Term: Increase workloads from initial exercise prescription for resistance, speed, and METs.;Short Term: Perform resistance training exercises routinely during rehab and add in resistance training at home;Long Term: Improve cardiorespiratory fitness, muscular endurance and strength as measured by increased METs and functional capacity (6MWT)       Able to understand and use rate of perceived exertion (RPE) scale Yes       Intervention Provide education and explanation on how to use RPE scale       Expected Outcomes Short Term: Able to use RPE daily in rehab to express  subjective intensity level;Long Term:  Able to use RPE to guide intensity level when exercising independently       Able to understand and use Dyspnea scale Yes       Intervention Provide education and explanation on how to use Dyspnea scale       Expected Outcomes Short Term: Able to use Dyspnea scale daily in rehab to express subjective sense of shortness of breath during exertion;Long Term: Able to use Dyspnea scale to guide intensity level when exercising independently       Knowledge and understanding of Target Heart Rate Range (THRR) Yes       Intervention Provide education and explanation of THRR including how the numbers were predicted and where they are located for reference       Expected Outcomes Short Term: Able to state/look up THRR;Long Term: Able to use THRR to govern intensity when exercising independently;Short Term: Able to use daily as guideline for intensity in rehab       Able to check pulse independently Yes       Intervention Provide education and demonstration on how to check pulse in carotid and radial arteries.;Review the importance of being able to check your own pulse for safety during independent exercise       Expected Outcomes Short Term: Able to explain why pulse checking is important during independent exercise;Long Term: Able to check pulse independently and accurately       Understanding of Exercise Prescription Yes  Intervention Provide education, explanation, and written materials on patient's individual exercise prescription       Expected Outcomes Short Term: Able to explain program exercise prescription;Long Term: Able to explain home exercise prescription to exercise independently              Exercise Goals Re-Evaluation :   Discharge Exercise Prescription (Final Exercise Prescription Changes):  Exercise Prescription Changes - 07/14/20 1400      Response to Exercise   Blood Pressure (Admit) 120/60    Blood Pressure (Exercise) 134/74    Blood  Pressure (Exit) 108/64    Heart Rate (Admit) 74 bpm    Heart Rate (Exercise) 92 bpm    Heart Rate (Exit) 83 bpm    Oxygen Saturation (Admit) 94 %    Oxygen Saturation (Exercise) 89 %    Oxygen Saturation (Exit) 96 %    Rating of Perceived Exertion (Exercise) 11    Perceived Dyspnea (Exercise) 1    Symptoms none           Nutrition:  Target Goals: Understanding of nutrition guidelines, daily intake of sodium <1557m, cholesterol <2065m calories 30% from fat and 7% or less from saturated fats, daily to have 5 or more servings of fruits and vegetables.  Education: All About Nutrition: -Group instruction provided by verbal, written material, interactive activities, discussions, models, and posters to present general guidelines for heart healthy nutrition including fat, fiber, MyPlate, the role of sodium in heart healthy nutrition, utilization of the nutrition label, and utilization of this knowledge for meal planning. Follow up email sent as well. Written material given at graduation. [facility]  Date 02/27/17  [facility] Review Code 1- Verbalizes Understanding      Biometrics:    Nutrition Therapy Plan and Nutrition Goals:   Nutrition Assessments:  MEDIFICTS Score Key:  ?70 Need to make dietary changes   40-70 Heart Healthy Diet  ? 40 Therapeutic Level Cholesterol Diet  [facility]  Picture Your Plate Total Score on Admission 56     Picture Your Plate Scores:  <4<23nhealthy dietary pattern with much room for improvement.  41-50 Dietary pattern unlikely to meet recommendations for good health and room for improvement.  51-60 More healthful dietary pattern, with some room for improvement.   >60 Healthy dietary pattern, although there may be some specific behaviors that could be improved.   Nutrition Goals  Re-Evaluation:   Nutrition Goals Discharge (Final Nutrition Goals Re-Evaluation):   Psychosocial: Target Goals: Acknowledge presence or absence of significant depression and/or stress, maximize coping skills, provide positive support system. Participant is able to verbalize types and ability to use techniques and skills needed for reducing stress and depression.   Education: Stress, Anxiety, and Depression - Group verbal and visual presentation to define topics covered.  Reviews how body is impacted by stress, anxiety, and depression.  Also discusses healthy ways to reduce stress and to treat/manage anxiety and depression.  Written material given at graduation. Flow[facility]  Date 04/19/17  Educator [facility] Review Code 1- VeUnited States Steel Corporationnderstanding      Education: Sleep Hygiene -Provides group verbal and written instruction about how sleep can affect your health.  Define sleep hygiene, discuss sleep cycles and impact of sleep habits. Review good sleep hygiene tips.    Initial Review &  Psychosocial Screening:  Initial Psych Review & Screening - 07/06/20 1539      Initial Review   Current issues with Current Sleep Concerns;Current Depression;History of Depression      Family Dynamics   Good Support System? Yes   daughter     Barriers   Psychosocial barriers to participate in program The patient should benefit from training in stress management and relaxation.      Screening Interventions   Interventions Encouraged to exercise;Provide feedback about the scores to participant;To provide support and resources with identified psychosocial needs    Expected Outcomes Short Term goal: Utilizing psychosocial counselor, staff and physician to assist with identification of specific Stressors or current issues interfering with healing process. Setting desired goal for each stressor or current issue identified.;Long Term  Goal: Stressors or current issues are controlled or eliminated.;Short Term goal: Identification and review with participant of any Quality of Life or Depression concerns found by scoring the questionnaire.;Long Term goal: The participant improves quality of Life and PHQ9 Scores as seen by post scores and/or verbalization of changes           Quality of Life Scores:  Scores of 19 and below usually indicate a poorer quality of life in these areas.  A difference of  2-3 points is a clinically meaningful difference.  A difference of 2-3 points in the total score of the Quality of Life Index has been associated with significant improvement in overall quality of life, self-image, physical symptoms, and general health in studies assessing change in quality of life.  PHQ-9: Recent Review Flowsheet Data    Depression screen [facility] 2/9 07/14/2020 05/01/2017 02/27/2017 11/29/2016 11/29/2016   Decreased Interest _0 Down, Depressed, Hopeless _1 PHQ - 2 Score _2 Altered sleeping 0 0 0 3 3   Tired, decreased energy _3 Change in appetite 0 _4 Feeling bad or failure about yourself  0 _5 Trouble concentrating 0 _6 Moving slowly or fidgety/restless 0 0 0 0 0   Suicidal thoughts 0 0 _7 PHQ-9 Score _8 Difficult doing work/chores Not difficult at all Not difficult at all Somewhat difficult - -     Interpretation of Total Score  Total Score Depression Severity:  1-4 = Minimal depression, 5-9 = Mild depression, 10-14 = Moderate depression, 15-19 = Moderately severe depression, 20-27 = Severe depression   Psychosocial Evaluation and Intervention:  Psychosocial Evaluation - 07/06/20 1556      Psychosocial Evaluation & Interventions   Interventions Stress management education;Relaxation education;Encouraged to exercise with the program and follow exercise prescription    Comments Story is returning to Pulmonary Rehab with pulmonary HTN. She  states she doesn't have stress because she "gets up and lives each day" she also stated that her feelings on depression are "who cares? we get up and do the same thing me and my husband so what difference does it make on my quality of life." She doesn't want to take any more medication even if it does help with mood. When she was in the program before, the counselor encouraged her to seek help from a therapist for help with depression but she said she could never get a hold of someone. She is seeing her  primary doctor next week and said she would mention it to them. Her daughter and son are local and help with whatever she needs. COVID has prevented her from being able to exercise and get out of the house, so she thinks coming to pulmonary rehab will help get her back into a routine even though she is really only coming to please her daughter and doctor.    Expected Outcomes Short: attend pulmonary rehab for education and exercise. Long: develop positive self care habits.    Continue Psychosocial Services  Follow up required by staff           Psychosocial Re-Evaluation:   Psychosocial Discharge (Final Psychosocial Re-Evaluation):   Education: Education Goals: Education classes will be provided on a weekly basis, covering required topics. Participant will state understanding/return demonstration of topics presented.  Learning Barriers/Preferences:  Learning Barriers/Preferences - 07/06/20 1554      Learning Barriers/Preferences   Learning Barriers None    Learning Preferences None           General Pulmonary Education Topics:  Infection Prevention: - Provides verbal and written material to individual with discussion of infection control including proper hand washing and proper equipment cleaning during exercise session. Flowsheet Row Pulmonary Rehab from 07/14/2020 in [facility] Cardiac and Pulmonary Rehab  Date 07/14/20  Educator AS  Instruction Review Code 1- Verbalizes Understanding       Falls Prevention: - Provides verbal and written material to individual with discussion of falls prevention and safety. Flowsheet Row Pulmonary Rehab from 07/14/2020 in [facility] Cardiac and Pulmonary Rehab  Date 07/14/20  Educator AS  Instruction Review Code 1- Verbalizes Understanding      Chronic Lung Disease Review: - Group verbal instruction with posters, models, PowerPoint presentations and videos,  to review new updates, new respiratory medications, new advancements in procedures and treatments. Providing information on websites and "800" numbers for continued self-education. Includes information about supplement oxygen, available portable oxygen systems, continuous and intermittent flow rates, oxygen safety, concentrators, and Medicare reimbursement for oxygen. Explanation of Pulmonary Drugs, including class, frequency, complications, importance of spacers, rinsing mouth after steroid MDI's, and proper cleaning methods for nebulizers. Review of basic lung anatomy and physiology related to function, structure, and complications of lung disease. Review of risk factors. Discussion about methods for diagnosing sleep apnea and types of masks and machines for OSA. Includes a review of the use of types of environmental controls: home humidity, furnaces, filters, dust mite/pet prevention, HEPA vacuums. Discussion about weather changes, air quality and the benefits of nasal washing. Instruction on Warning signs, infection symptoms, calling MD promptly, preventive modes, and value of vaccinations. Review of effective airway clearance, coughing and/or vibration techniques. Emphasizing that all should Create an Action Plan. Written material given at graduation. Flowsheet Row Pulmonary Rehab from 06/23/2017 in [facility] Cardiac and Pulmonary Rehab  Date 06/07/17  Educator [facility]  Instruction Review Code 1- Verbalizes Understanding      AED/CPR: - Group verbal and written instruction with the use of models to  demonstrate the basic use of the AED with the basic ABC's of resuscitation.    Anatomy and Cardiac Procedures: - Group verbal and visual presentation and models provide information about basic cardiac anatomy and function. Reviews the testing methods done to diagnose heart disease and the outcomes of the test results. Describes the treatment choices: Medical Management, Angioplasty, or Coronary Bypass Surgery for treating various heart conditions including Myocardial Infarction, Angina, Valve Disease, and Cardiac Arrhythmias.  Written material given  at graduation. Flowsheet Row Pulmonary Rehab from 06/23/2017 in [facility] Cardiac and Pulmonary Rehab  Date 04/21/17  Educator [facility]  Instruction Review Code 1- Verbalizes Understanding      Medication Safety: - Group verbal and visual instruction to review commonly prescribed medications for heart and lung disease. Reviews the medication, class of the drug, and side effects. Includes the steps to properly store meds and maintain the prescription regimen.  Written material given at graduation. Flowsheet Row Pulmonary Rehab from 06/23/2017 in [facility] Cardiac and Pulmonary Rehab  Date 05/12/17  Educator [place]  Instruction Review Code 1- Verbalizes Understanding      Other: -Provides group and verbal instruction on various topics (see comments)   Knowledge Questionnaire Score:  Knowledge Questionnaire Score - 07/14/20 1439      Knowledge Questionnaire Score   Pre Score 13/18  oxygen            Core Components/Risk Factors/Patient Goals at Admission:  Personal Goals and Risk Factors at Admission - 07/14/20 1445      Core Components/Risk Factors/Patient Goals on Admission    Weight Management Weight Maintenance;Yes    Intervention Weight Management: Develop a combined nutrition and exercise program designed to reach desired caloric intake, while maintaining appropriate intake of nutrient and fiber, sodium and fats, and appropriate energy  expenditure required for the weight goal.;Weight Management: Provide education and appropriate resources to help participant work on and attain dietary goals.;Weight Management/Obesity: Establish reasonable short term and long term weight goals.    Admit Weight 165 lb 6.4 oz (75 kg)    Goal Weight: Short Term 160 lb (72.6 kg)    Goal Weight: Long Term 160 lb (72.6 kg)    Expected Outcomes Short Term: Continue to assess and modify interventions until short term weight is achieved;Long Term: Adherence to nutrition and physical activity/exercise program aimed toward attainment of established weight goal;Weight Maintenance: Understanding of the daily nutrition guidelines, which includes 25-35% calories from fat, 7% or less cal from saturated fats, less than 258m cholesterol, less than 1.5gm of sodium, & 5 or more servings of fruits and vegetables daily;Understanding recommendations for meals to include 15-35% energy as protein, 25-35% energy from fat, 35-60% energy from carbohydrates, less than 2041mof dietary cholesterol, 20-35 gm of total fiber daily;Understanding of distribution of calorie intake throughout the day with the consumption of 4-5 meals/snacks    Improve shortness of breath with ADL's Yes    Intervention Provide education, individualized exercise plan and daily activity instruction to help decrease symptoms of SOB with activities of daily living.    Expected Outcomes Short Term: Improve cardiorespiratory fitness to achieve a reduction of symptoms when performing ADLs;Long Term: Be able to perform more ADLs without symptoms or delay the onset of symptoms    Diabetes Yes    Intervention Provide education about signs/symptoms and action to take for hypo/hyperglycemia.;Provide education about proper nutrition, including hydration, and aerobic/resistive exercise prescription along with prescribed medications to achieve blood glucose in normal ranges: Fasting glucose 65-99 mg/dL    Expected Outcomes  Short Term: Participant verbalizes understanding of the signs/symptoms and immediate care of hyper/hypoglycemia, proper foot care and importance of medication, aerobic/resistive exercise and nutrition plan for blood glucose control.;Long Term: Attainment of HbA1C < 7%.    Heart Failure Yes    Intervention Provide a combined exercise and nutrition program that is supplemented with education, support and counseling about heart failure. Directed toward relieving symptoms such as shortness of breath, decreased exercise tolerance, and  extremity edema.    Expected Outcomes Improve functional capacity of life;Short term: Attendance in program 2-3 days a week with increased exercise capacity. Reported lower sodium intake. Reported increased fruit and vegetable intake. Reports medication compliance.;Short term: Daily weights obtained and reported for increase. Utilizing diuretic protocols set by physician.;Long term: Adoption of self-care skills and reduction of barriers for early signs and symptoms recognition and intervention leading to self-care maintenance.    Hypertension Yes    Intervention Provide education on lifestyle modifcations including regular physical activity/exercise, weight management, moderate sodium restriction and increased consumption of fresh fruit, vegetables, and low fat dairy, alcohol moderation, and smoking cessation.;Monitor prescription use compliance.    Expected Outcomes Short Term: Continued assessment and intervention until BP is < 140/60m HG in hypertensive participants. < 130/838mHG in hypertensive participants with diabetes, heart failure or chronic kidney disease.;Long Term: Maintenance of blood pressure at goal levels.    Lipids Yes    Intervention Provide education and support for participant on nutrition & aerobic/resistive exercise along with prescribed medications to achieve LDL <7059mHDL >68m29m  Expected Outcomes Short Term: Participant states understanding of desired  cholesterol values and is compliant with medications prescribed. Participant is following exercise prescription and nutrition guidelines.;Long Term: Cholesterol controlled with medications as prescribed, with individualized exercise RX and with personalized nutrition plan. Value goals: LDL < 70mg2mL > 40 mg.           Education:Diabetes - Individual verbal and written instruction to review signs/symptoms of diabetes, desired ranges of glucose level fasting, after meals and with exercise. Acknowledge that pre and post exercise glucose checks will be done for 3 sessions at entry of program. Flowsheet [facility]  Date 07/06/20  [facility]ator [facility] Code 1- Verbalizes Understanding      Know Your Numbers and Heart Failure: - Group verbal and visual instruction to discuss disease risk factors for cardiac and pulmonary disease and treatment options.  Reviews associated critical values for Overweight/Obesity, Hypertension, Cholesterol, and Diabetes.  Discusses basics of heart failure: signs/symptoms and treatments.  Introduces Heart Failure Zone chart for action plan for heart failure.  Written material given at graduation.   Core Components/Risk Factors/Patient Goals Review:    Core Components/Risk Factors/Patient Goals at Discharge (Final Review):    ITP Comments:  ITP Comments    Row Name 07/06/20 1536           ITP Comments Initial telephone orientation completed. Diagnosis can be found in 11/12. EP orientation scheduled for 2/8 at 1pm              Comments: initial ITP

## 2020-07-14 NOTE — Patient Instructions (Signed)
Patient Instructions  Patient Details  Name: Jamie Murray MRN: [id number] Date of Birth: 08/14/1945 Referring Provider:  Larey Dresser, MD  Below are your personal goals for exercise, nutrition, and risk factors. Our goal is to help you stay on track towards obtaining and maintaining these goals. We will be discussing your progress on these goals with you throughout the program.  Initial Exercise Prescription:  Initial Exercise Prescription - 07/14/20 1400      Date of Initial Exercise RX and Referring Provider   Date 07/14/20    Referring Provider [name]      Treadmill   MPH 1.4    Grade 0    Minutes 15    METs 2      Recumbant Bike   Level 1    RPM 60    Minutes 15    METs 2      NuStep   Level 1    SPM 80    Minutes 15    METs 2      Recumbant Elliptical   Level 1    RPM 50    Minutes 15    METs 2      REL-XR   Level 1    Speed 50    Minutes 15    METs 2      Prescription Details   Frequency (times per week) 3    Duration Progress to 30 minutes of continuous aerobic without signs/symptoms of physical distress      Intensity   THRR 40-80% of Max Heartrate 103-132    Ratings of Perceived Exertion 11-13    Perceived Dyspnea 0-4      Progression   Progression Continue to progress workloads to maintain intensity without signs/symptoms of physical distress.      Resistance Training   Training Prescription Yes    Weight 2 lb    Reps 10-15           Exercise Goals: Frequency: Be able to perform aerobic exercise two to three times per week in program working toward 2-5 days per week of home exercise.  Intensity: Work with a perceived exertion of 11 (fairly light) - 15 (hard) while following your exercise prescription.  We will make changes to your prescription with you as you progress through the program.   Duration: Be able to do 30 to 45 minutes of continuous aerobic exercise in addition to a 5 minute warm-up and a 5 minute cool-down  routine.   Nutrition Goals: Your personal nutrition goals will be established when you do your nutrition analysis with the dietician.  The following are general nutrition guidelines to follow: Cholesterol < 259m/day Sodium < 15095mday Fiber: Women over 50 yrs - 21 grams per day  Personal Goals:  Personal Goals and Risk Factors at Admission - 07/14/20 1445      Core Components/Risk Factors/Patient Goals on Admission    Weight Management Weight Maintenance;Yes    Intervention Weight Management: Develop a combined nutrition and exercise program designed to reach desired caloric intake, while maintaining appropriate intake of nutrient and fiber, sodium and fats, and appropriate energy expenditure required for the weight goal.;Weight Management: Provide education and appropriate resources to help participant work on and attain dietary goals.;Weight Management/Obesity: Establish reasonable short term and long term weight goals.    Admit Weight 165 lb 6.4 oz (75 kg)    Goal Weight: Short Term 160 lb (72.6 kg)    Goal Weight: Long Term 160 lb (72.6  kg)    Expected Outcomes Short Term: Continue to assess and modify interventions until short term weight is achieved;Long Term: Adherence to nutrition and physical activity/exercise program aimed toward attainment of established weight goal;Weight Maintenance: Understanding of the daily nutrition guidelines, which includes 25-35% calories from fat, 7% or less cal from saturated fats, less than 220m cholesterol, less than 1.5gm of sodium, & 5 or more servings of fruits and vegetables daily;Understanding recommendations for meals to include 15-35% energy as protein, 25-35% energy from fat, 35-60% energy from carbohydrates, less than 2065mof dietary cholesterol, 20-35 gm of total fiber daily;Understanding of distribution of calorie intake throughout the day with the consumption of 4-5 meals/snacks    Improve shortness of breath with ADL's Yes    Intervention  Provide education, individualized exercise plan and daily activity instruction to help decrease symptoms of SOB with activities of daily living.    Expected Outcomes Short Term: Improve cardiorespiratory fitness to achieve a reduction of symptoms when performing ADLs;Long Term: Be able to perform more ADLs without symptoms or delay the onset of symptoms    Diabetes Yes    Intervention Provide education about signs/symptoms and action to take for hypo/hyperglycemia.;Provide education about proper nutrition, including hydration, and aerobic/resistive exercise prescription along with prescribed medications to achieve blood glucose in normal ranges: Fasting glucose 65-99 mg/dL    Expected Outcomes Short Term: Participant verbalizes understanding of the signs/symptoms and immediate care of hyper/hypoglycemia, proper foot care and importance of medication, aerobic/resistive exercise and nutrition plan for blood glucose control.;Long Term: Attainment of HbA1C < 7%.    Heart Failure Yes    Intervention Provide a combined exercise and nutrition program that is supplemented with education, support and counseling about heart failure. Directed toward relieving symptoms such as shortness of breath, decreased exercise tolerance, and extremity edema.    Expected Outcomes Improve functional capacity of life;Short term: Attendance in program 2-3 days a week with increased exercise capacity. Reported lower sodium intake. Reported increased fruit and vegetable intake. Reports medication compliance.;Short term: Daily weights obtained and reported for increase. Utilizing diuretic protocols set by physician.;Long term: Adoption of self-care skills and reduction of barriers for early signs and symptoms recognition and intervention leading to self-care maintenance.    Hypertension Yes    Intervention Provide education on lifestyle modifcations including regular physical activity/exercise, weight management, moderate sodium  restriction and increased consumption of fresh fruit, vegetables, and low fat dairy, alcohol moderation, and smoking cessation.;Monitor prescription use compliance.    Expected Outcomes Short Term: Continued assessment and intervention until BP is < 140/9064mG in hypertensive participants. < 130/32m27m in hypertensive participants with diabetes, heart failure or chronic kidney disease.;Long Term: Maintenance of blood pressure at goal levels.    Lipids Yes    Intervention Provide education and support for participant on nutrition & aerobic/resistive exercise along with prescribed medications to achieve LDL <70mg84mL >40mg.38mExpected Outcomes Short Term: Participant states understanding of desired cholesterol values and is compliant with medications prescribed. Participant is following exercise prescription and nutrition guidelines.;Long Term: Cholesterol controlled with medications as prescribed, with individualized exercise RX and with personalized nutrition plan. Value goals: LDL < 70mg, 74m> 40 mg.           Tobacco Use Initial Evaluation: Social History   Tobacco Use  Smoking Status Never Smoker  Smokeless Tobacco Never Used    Exercise Goals and Review:  Exercise Goals    Row Name 07/14/20 1447  Exercise Goals   Increase Physical Activity Yes       Intervention Provide advice, education, support and counseling about physical activity/exercise needs.;Develop an individualized exercise prescription for aerobic and resistive training based on initial evaluation findings, risk stratification, comorbidities and participant's personal goals.       Expected Outcomes Short Term: Attend rehab on a regular basis to increase amount of physical activity.;Long Term: Add in home exercise to make exercise part of routine and to increase amount of physical activity.;Long Term: Exercising regularly at least 3-5 days a week.       Increase Strength and Stamina Yes       Intervention  Provide advice, education, support and counseling about physical activity/exercise needs.;Develop an individualized exercise prescription for aerobic and resistive training based on initial evaluation findings, risk stratification, comorbidities and participant's personal goals.       Expected Outcomes Short Term: Increase workloads from initial exercise prescription for resistance, speed, and METs.;Short Term: Perform resistance training exercises routinely during rehab and add in resistance training at home;Long Term: Improve cardiorespiratory fitness, muscular endurance and strength as measured by increased METs and functional capacity (6MWT)       Able to understand and use rate of perceived exertion (RPE) scale Yes       Intervention Provide education and explanation on how to use RPE scale       Expected Outcomes Short Term: Able to use RPE daily in rehab to express subjective intensity level;Long Term:  Able to use RPE to guide intensity level when exercising independently       Able to understand and use Dyspnea scale Yes       Intervention Provide education and explanation on how to use Dyspnea scale       Expected Outcomes Short Term: Able to use Dyspnea scale daily in rehab to express subjective sense of shortness of breath during exertion;Long Term: Able to use Dyspnea scale to guide intensity level when exercising independently       Knowledge and understanding of Target Heart Rate Range (THRR) Yes       Intervention Provide education and explanation of THRR including how the numbers were predicted and where they are located for reference       Expected Outcomes Short Term: Able to state/look up THRR;Long Term: Able to use THRR to govern intensity when exercising independently;Short Term: Able to use daily as guideline for intensity in rehab       Able to check pulse independently Yes       Intervention Provide education and demonstration on how to check pulse in carotid and radial  arteries.;Review the importance of being able to check your own pulse for safety during independent exercise       Expected Outcomes Short Term: Able to explain why pulse checking is important during independent exercise;Long Term: Able to check pulse independently and accurately       Understanding of Exercise Prescription Yes       Intervention Provide education, explanation, and written materials on patient's individual exercise prescription       Expected Outcomes Short Term: Able to explain program exercise prescription;Long Term: Able to explain home exercise prescription to exercise independently              Copy of goals given to participant.

## 2020-07-21 ENCOUNTER — Other Ambulatory Visit: Payer: Self-pay

## 2020-07-21 ENCOUNTER — Encounter: Payer: Medicare PPO | Admitting: *Deleted

## 2020-07-21 DIAGNOSIS — I272 Pulmonary hypertension, unspecified: Secondary | ICD-10-CM

## 2020-07-21 LAB — GLUCOSE, CAPILLARY
Glucose-Capillary: 140 mg/dL — ABNORMAL HIGH (ref 70–99)
Glucose-Capillary: 117 mg/dL — ABNORMAL HIGH (ref 70–99)

## 2020-07-21 NOTE — Progress Notes (Signed)
Daily Session Note  Patient Details  Name: Jamie Murray MRN: [id number] Date of Birth: 1945-06-30 Referring Provider:   [name] Pulmonary Rehab from 07/14/2020 in [facility] Cardiac and Pulmonary Rehab  Referring Provider [name]      Encounter Date: 07/21/2020  Check In:  Session Check In - 07/21/20 1123      Check-In   Supervising physician immediately available to respond to emergencies See telemetry face sheet for immediately available ER MD    Location [facility]-Cardiac & Pulmonary Rehab    Staff Present Heath Lark, RN, BSN, CCRP;Melissa [place] RDN, LDN;Joseph [employer], [place], ACSM CEP, Exercise Physiologist    Virtual Visit No    Medication changes reported     No    Fall or balance concerns reported    No    Warm-up and Cool-down Performed on first and last piece of equipment    Resistance Training Performed Yes    VAD Patient? No    PAD/SET Patient? No      Pain Assessment   Currently in Pain? No/denies              Social History   Tobacco Use  Smoking Status Never Smoker  Smokeless Tobacco Never Used    Goals Met:  Proper associated with RPD/PD & O2 Sat Exercise tolerated well Personal goals reviewed No report of cardiac concerns or symptoms  Goals Unmet:  Not Applicable  Comments: First full day of exercise!  Patient was oriented to gym and equipment including functions, settings, policies, and procedures.  Patient's individual exercise prescription and treatment plan were reviewed.  All starting workloads were established based on the results of the 6 minute walk test done at initial orientation visit.  The plan for exercise progression was also introduced and progression will be customized based on patient's performance and goals.    Dr. Emily Filbert is Medical Director for [facility].

## 2020-07-22 ENCOUNTER — Telehealth: Payer: Self-pay

## 2020-07-22 NOTE — Telephone Encounter (Signed)
Called Jamie Murray for nutrition appointment at 230 pm 07/22/20 - Rang twice - "call could not be completed at this time"

## 2020-07-23 ENCOUNTER — Other Ambulatory Visit: Payer: Self-pay

## 2020-07-23 DIAGNOSIS — I272 Pulmonary hypertension, unspecified: Secondary | ICD-10-CM | POA: Diagnosis not present

## 2020-07-23 LAB — GLUCOSE, CAPILLARY
Glucose-Capillary: 158 mg/dL — ABNORMAL HIGH (ref 70–99)
Glucose-Capillary: 129 mg/dL — ABNORMAL HIGH (ref 70–99)

## 2020-07-23 NOTE — Progress Notes (Signed)
Daily Session Note  Patient Details  Name: Jamie Murray MRN: [id number] Date of Birth: 1946/02/16 Referring Provider:   [name] Pulmonary Rehab from 07/14/2020 in [facility] Cardiac and Pulmonary Rehab  Referring Provider [name]      Encounter Date: 07/23/2020  Check In:  Session Check In - 07/23/20 1105      Check-In   Supervising physician immediately available to respond to emergencies See telemetry face sheet for immediately available ER MD    Location [facility]-Cardiac & Pulmonary Rehab    Staff Present Birdie Sons, MPA, RN;Melissa [place] RDN, LDN;Joseph Alcus Dad, RN BSN    Virtual Visit No    Medication changes reported     No    Fall or balance concerns reported    No    Warm-up and Cool-down Performed on first and last piece of equipment    Resistance Training Performed Yes    VAD Patient? No    PAD/SET Patient? No      Pain Assessment   Currently in Pain? No/denies              Social History   Tobacco Use  Smoking Status Never Smoker  Smokeless Tobacco Never Used    Goals Met:  Independence with exercise equipment Exercise tolerated well No report of cardiac concerns or symptoms Strength training completed today  Goals Unmet:  Not Applicable  Comments: Pt able to follow exercise prescription today without complaint.  Will continue to monitor for progression.    Dr. Emily Filbert is Medical Director for [place] and [facility].

## 2020-07-29 ENCOUNTER — Encounter: Payer: Self-pay | Admitting: *Deleted

## 2020-07-29 DIAGNOSIS — I272 Pulmonary hypertension, unspecified: Secondary | ICD-10-CM

## 2020-07-29 NOTE — Progress Notes (Signed)
Pulmonary Individual Treatment Plan  Patient Details  Name: Jamie Murray MRN: [id number] Date of Birth: 07/21/1945 Referring Provider:   [name] Pulmonary Rehab from 07/14/2020 in [facility] Cardiac and Pulmonary Rehab  Referring Provider [name]      Initial Encounter Date:  Flowsheet Row Pulmonary Rehab from 07/14/2020 in [facility] Cardiac and Pulmonary Rehab  Date 07/14/20      Visit Diagnosis: Pulmonary hypertension ([place])  Patient's Home Medications on Admission:  Current Outpatient Medications:  .  ambrisentan (LETAIRIS) 10 MG tablet, Take 1 tablet (10 mg total) by mouth daily., Disp: 30 tablet, Rfl: 11 .  amLODipine (NORVASC) 10 MG tablet, Take 1 tablet (10 mg total) by mouth daily., Disp: 30 tablet, Rfl: 6 .  aspirin 81 MG tablet, Take 81 mg by mouth daily., Disp: , Rfl:  .  clonazePAM (KLONOPIN) 1 MG tablet, Take 1 mg by mouth daily., Disp: , Rfl:  .  dapagliflozin propanediol (FARXIGA) 10 MG TABS tablet, Take 10 mg by mouth daily., Disp: , Rfl:  .  Fe Fum-FA-B Cmp-C-Zn-Mg-Mn-Cu (HEMOCYTE PLUS) 106-1 MG CAPS, Take 1 capsule by mouth daily., Disp: , Rfl: 6 .  furosemide (LASIX) 20 MG tablet, Take 1 tablet (20 mg total) by mouth every other day., Disp: 30 tablet, Rfl: 6 .  hydrALAZINE (APRESOLINE) 50 MG tablet, Take 1 tablet (50 mg total) by mouth every 8 (eight) hours., Disp: 90 tablet, Rfl: 0 .  insulin aspart (NOVOLOG) 100 UNIT/ML FlexPen, Inject 4 Units into the skin 3 (three) times daily with meals., Disp: 15 mL, Rfl: 0 .  insulin degludec (TRESIBA) 200 UNIT/ML FlexTouch Pen, Inject 26 Units into the skin at bedtime., Disp: , Rfl:  .  levothyroxine (SYNTHROID) 75 MCG tablet, Take 75 mcg by mouth daily before breakfast., Disp: , Rfl:  .  loratadine (CLARITIN) 10 MG tablet, Take 10 mg by mouth daily. , Disp: , Rfl:  .  meclizine (ANTIVERT) 25 MG tablet, Take 25 mg by mouth 3 (three) times daily as needed for dizziness. , Disp: , Rfl:  .  metoprolol succinate (TOPROL-XL) 50  MG 24 hr tablet, Take 50 mg by mouth daily. Take with or immediately following a meal., Disp: , Rfl:  .  Multiple Vitamins-Minerals (MULTI ADULT GUMMIES) CHEW, Chew by mouth daily., Disp: , Rfl:  .  potassium chloride (KLOR-CON) 10 MEQ tablet, Take 1 tablet (10 mEq total) by mouth every other day., Disp: 15 tablet, Rfl: 6 .  rosuvastatin (CRESTOR) 10 MG tablet, Take 10 mg by mouth daily., Disp: , Rfl:  .  Selexipag (UPTRAVI) 200 MCG TABS, As directed by clinic, Disp: 60 tablet, Rfl: 5 .  tadalafil, PAH, (ADCIRCA) 20 MG tablet, Take 1 tablet (20 mg total) by mouth daily., Disp: 60 tablet, Rfl: 11  Past Medical History: Past Medical History:  Diagnosis Date  . Allergy   . Anemia   . Anxiety   . COPD (chronic obstructive pulmonary disease) ([place])   . Depression   . Diabetes ([place])   . GERD (gastroesophageal reflux disease)   . Hyperlipidemia   . Hypertension   . Insomnia   . Migraines   . Osteoporosis   . [facility] spotted fever   . Transient cerebral ischemia   . Vertigo    every 2-3 months    Tobacco Use: Social History   Tobacco Use  Smoking Status Never Smoker  Smokeless Tobacco Never Used    Labs: Recent Review Scientist, physiological    Labs for ITP Cardiac  and Pulmonary Rehab Latest Ref Rng & Units 03/07/2016 06/07/2016 11/29/2016 01/09/2020 07/03/2020   Cholestrol 0 - 200 mg/dL 145 213(H) 95(L) 128 121   LDLCALC 0 - 99 mg/dL 77 143(H) 45 68 55   HDL >40 mg/dL 43 37(L) 29(L) 24(L) 43   Trlycerides <150 mg/dL 123 164(H) 105 178(H) 115   Hemoglobin A1c 4.8 - 5.6 % 7.8(H) 8.5(H) 7.1(H) 10.9(H) -       Pulmonary Assessment Scores:  Pulmonary Assessment Scores    Row Name 07/14/20 1440         ADL UCSD   SOB Score total 33     Rest 3     Walk 3     Stairs 5     Bath 0     Dress 0     Shop 0           CAT Score   CAT Score 19           mMRC Score   mMRC Score 2            UCSD: Self-administered rating of dyspnea associated with activities of daily living  (ADLs) 6-point scale (0 = "not at all" to 5 = "maximal or unable to do because of breathlessness")  Scoring Scores range from 0 to 120.  Minimally important difference is 5 units  CAT: CAT can identify the health impairment of COPD patients and is better correlated with disease progression.  CAT has a scoring range of zero to 40. The CAT score is classified into four groups of low (less than 10), medium (10 - 20), high (21-30) and very high (31-40) based on the impact level of disease on health status. A CAT score over 10 suggests significant symptoms.  A worsening CAT score could be explained by an exacerbation, poor medication adherence, poor inhaler technique, or progression of COPD or comorbid conditions.  CAT MCID is 2 points  mMRC: mMRC (Modified Medical Research Council) Dyspnea Scale is used to assess the degree of baseline functional disability in patients of respiratory disease due to dyspnea. No minimal important difference is established. A decrease in score of 1 point or greater is considered a positive change.   Pulmonary Function Assessment:   Exercise Target Goals: Exercise Program Goal: Individual exercise prescription set using results from initial 6 min walk test and THRR while considering  patient's activity barriers and safety.   Exercise Prescription Goal: Initial exercise prescription builds to 30-45 minutes a day of aerobic activity, 2-3 days per week.  Home exercise guidelines will be given to patient during program as part of exercise prescription that the participant will acknowledge.  Education: Aerobic Exercise: - Group verbal and visual presentation on the components of exercise prescription. Introduces F.I.T.T principle from ACSM for exercise prescriptions.  Reviews F.I.T.T. principles of aerobic exercise including progression. Written material given at graduation. Flowsheet Row Pulmonary Rehab from 07/23/2020 in [facility] Cardiac and Pulmonary Rehab  Date 07/23/20   Educator jh  Instruction Review Code 1- Verbalizes Understanding      Education: Resistance Exercise: - Group verbal and visual presentation on the components of exercise prescription. Introduces F.I.T.T principle from ACSM for exercise prescriptions  Reviews F.I.T.T. principles of resistance exercise including progression. Written material given at graduation.    Education: Exercise & Equipment Safety: - Individual verbal instruction and demonstration of equipment use and safety with use of the equipment. Flowsheet Row Pulmonary Rehab from 07/23/2020 in [facility] Cardiac and Pulmonary Rehab  Date  07/14/20  Educator AS  Instruction Review Code 1- Verbalizes Understanding      Education: Exercise Physiology & General Exercise Guidelines: - Group verbal and written instruction with models to review the exercise physiology of the cardiovascular system and associated critical values. Provides general exercise guidelines with specific guidelines to those with heart or lung disease.    Education: Flexibility, Balance, Mind/Body Relaxation: - Group verbal and visual presentation with interactive activity on the components of exercise prescription. Introduces F.I.T.T principle from ACSM for exercise prescriptions. Reviews F.I.T.T. principles of flexibility and balance exercise training including progression. Also discusses the mind body connection.  Reviews various relaxation techniques to help reduce and manage stress (i.e. Deep breathing, progressive muscle relaxation, and visualization). Balance handout provided to take home. Written material given at graduation.   Activity Barriers & Risk Stratification:  Activity Barriers & Cardiac Risk Stratification - 07/06/20 1534      Activity Barriers & Cardiac Risk Stratification   Activity Barriers Back Problems;Muscular Weakness;Assistive Device   cane          6 Minute Walk:  6 Minute Walk    Row Name 07/14/20 1427         6 Minute Walk    Distance 780 feet     Walk Time 6 minutes     # of Rest Breaks 0     MPH 1.47     METS 1.9     RPE 11     Perceived Dyspnea  1     VO2 Peak 6.63     Symptoms No     Resting HR 74 bpm     Resting BP 120/60     Resting Oxygen Saturation  94 %     Exercise Oxygen Saturation  during 6 min walk 89 %     Max Ex. HR 92 bpm     Max Ex. BP 134/74     2 Minute Post BP 108/64           Interval HR   1 Minute HR 80     2 Minute HR 85     3 Minute HR 86     4 Minute HR 89     5 Minute HR 92     6 Minute HR 89     2 Minute Post HR 83     Interval Heart Rate? Yes           Interval Oxygen   Interval Oxygen? Yes     Baseline Oxygen Saturation % 94 %     1 Minute Oxygen Saturation % 98 %     1 Minute Liters of Oxygen 0 L     2 Minute Oxygen Saturation % 96 %     2 Minute Liters of Oxygen 0 L     3 Minute Oxygen Saturation % 93 %     3 Minute Liters of Oxygen 0 L     4 Minute Oxygen Saturation % 95 %     4 Minute Liters of Oxygen 0 L     5 Minute Oxygen Saturation % 89 %     5 Minute Liters of Oxygen 0 L     6 Minute Oxygen Saturation % 91 %     6 Minute Liters of Oxygen 0 L     2 Minute Post Oxygen Saturation % 96 %     2 Minute Post Liters of Oxygen 0 L  Oxygen Initial Assessment:  Oxygen Initial Assessment - 07/06/20 1604      Home Oxygen   Home Oxygen Device None    Sleep Oxygen Prescription CPAP    Home Exercise Oxygen Prescription None    Home Resting Oxygen Prescription None    Compliance with Home Oxygen Use No   issues with CPAP compliance due to congestion for last 2 months     Initial 6 min Walk   Oxygen Used None      Program Oxygen Prescription   Program Oxygen Prescription None      Intervention   Short Term Goals To learn and understand importance of monitoring SPO2 with pulse oximeter and demonstrate accurate use of the pulse oximeter.;To learn and understand importance of maintaining oxygen saturations>88%;To learn and demonstrate proper  pursed lip breathing techniques or other breathing techniques.;To learn and demonstrate proper use of respiratory medications    Long  Term Goals Verbalizes importance of monitoring SPO2 with pulse oximeter and return demonstration;Compliance with respiratory medication;Exhibits proper breathing techniques, such as pursed lip breathing or other method taught during program session;Maintenance of O2 saturations>88%           Oxygen Re-Evaluation:  Oxygen Re-Evaluation    Row Name 07/21/20 1125             Program Oxygen Prescription   Program Oxygen Prescription None               Home Oxygen   Home Oxygen Device None       Sleep Oxygen Prescription CPAP       Liters per minute 0       Home Exercise Oxygen Prescription None       Home Resting Oxygen Prescription None       Compliance with Home Oxygen Use Yes               Goals/Expected Outcomes   Short Term Goals To learn and demonstrate proper pursed lip breathing techniques or other breathing techniques.       Comments Reviewed PLB technique with pt.  Talked about how it works and it's importance in maintaining their exercise saturations.       Goals/Expected Outcomes Short: Become more profiecient at using PLB.   Long: Become independent at using PLB.              Oxygen Discharge (Final Oxygen Re-Evaluation):  Oxygen Re-Evaluation - 07/21/20 1125      Program Oxygen Prescription   Program Oxygen Prescription None      Home Oxygen   Home Oxygen Device None    Sleep Oxygen Prescription CPAP    Liters per minute 0    Home Exercise Oxygen Prescription None    Home Resting Oxygen Prescription None    Compliance with Home Oxygen Use Yes      Goals/Expected Outcomes   Short Term Goals To learn and demonstrate proper pursed lip breathing techniques or other breathing techniques.    Comments Reviewed PLB technique with pt.  Talked about how it works and it's importance in maintaining their exercise saturations.     Goals/Expected Outcomes Short: Become more profiecient at using PLB.   Long: Become independent at using PLB.           Initial Exercise Prescription:  Initial Exercise Prescription - 07/14/20 1400      Date of Initial Exercise RX and Referring Provider   Date 07/14/20    Referring Provider [name]  Treadmill   MPH 1.4    Grade 0    Minutes 15    METs 2      Recumbant Bike   Level 1    RPM 60    Minutes 15    METs 2      NuStep   Level 1    SPM 80    Minutes 15    METs 2      Recumbant Elliptical   Level 1    RPM 50    Minutes 15    METs 2      REL-XR   Level 1    Speed 50    Minutes 15    METs 2      Prescription Details   Frequency (times per week) 3    Duration Progress to 30 minutes of continuous aerobic without signs/symptoms of physical distress      Intensity   THRR 40-80% of Max Heartrate 103-132    Ratings of Perceived Exertion 11-13    Perceived Dyspnea 0-4      Progression   Progression Continue to progress workloads to maintain intensity without signs/symptoms of physical distress.      Resistance Training   Training Prescription Yes    Weight 2 lb    Reps 10-15           Perform Capillary Blood Glucose checks as needed.  Exercise Prescription Changes:  Exercise Prescription Changes    Row Name 07/14/20 1400             Response to Exercise   Blood Pressure (Admit) 120/60       Blood Pressure (Exercise) 134/74       Blood Pressure (Exit) 108/64       Heart Rate (Admit) 74 bpm       Heart Rate (Exercise) 92 bpm       Heart Rate (Exit) 83 bpm       Oxygen Saturation (Admit) 94 %       Oxygen Saturation (Exercise) 89 %       Oxygen Saturation (Exit) 96 %       Rating of Perceived Exertion (Exercise) 11       Perceived Dyspnea (Exercise) 1       Symptoms none              Exercise Comments:  Exercise Comments    Row Name 07/21/20 1123           Exercise Comments First full day of exercise!  Patient was  oriented to gym and equipment including functions, settings, policies, and procedures.  Patient's individual exercise prescription and treatment plan were reviewed.  All starting workloads were established based on the results of the 6 minute walk test done at initial orientation visit.  The plan for exercise progression was also introduced and progression will be customized based on patient's performance and goals.              Exercise Goals and Review:  Exercise Goals    Row Name 07/14/20 1447             Exercise Goals   Increase Physical Activity Yes       Intervention Provide advice, education, support and counseling about physical activity/exercise needs.;Develop an individualized exercise prescription for aerobic and resistive training based on initial evaluation findings, risk stratification, comorbidities and participant's personal goals.       Expected Outcomes Short Term: Attend rehab on a  regular basis to increase amount of physical activity.;Long Term: Add in home exercise to make exercise part of routine and to increase amount of physical activity.;Long Term: Exercising regularly at least 3-5 days a week.       Increase Strength and Stamina Yes       Intervention Provide advice, education, support and counseling about physical activity/exercise needs.;Develop an individualized exercise prescription for aerobic and resistive training based on initial evaluation findings, risk stratification, comorbidities and participant's personal goals.       Expected Outcomes Short Term: Increase workloads from initial exercise prescription for resistance, speed, and METs.;Short Term: Perform resistance training exercises routinely during rehab and add in resistance training at home;Long Term: Improve cardiorespiratory fitness, muscular endurance and strength as measured by increased METs and functional capacity (6MWT)       Able to understand and use rate of perceived exertion (RPE) scale Yes        Intervention Provide education and explanation on how to use RPE scale       Expected Outcomes Short Term: Able to use RPE daily in rehab to express subjective intensity level;Long Term:  Able to use RPE to guide intensity level when exercising independently       Able to understand and use Dyspnea scale Yes       Intervention Provide education and explanation on how to use Dyspnea scale       Expected Outcomes Short Term: Able to use Dyspnea scale daily in rehab to express subjective sense of shortness of breath during exertion;Long Term: Able to use Dyspnea scale to guide intensity level when exercising independently       Knowledge and understanding of Target Heart Rate Range (THRR) Yes       Intervention Provide education and explanation of THRR including how the numbers were predicted and where they are located for reference       Expected Outcomes Short Term: Able to state/look up THRR;Long Term: Able to use THRR to govern intensity when exercising independently;Short Term: Able to use daily as guideline for intensity in rehab       Able to check pulse independently Yes       Intervention Provide education and demonstration on how to check pulse in carotid and radial arteries.;Review the importance of being able to check your own pulse for safety during independent exercise       Expected Outcomes Short Term: Able to explain why pulse checking is important during independent exercise;Long Term: Able to check pulse independently and accurately       Understanding of Exercise Prescription Yes       Intervention Provide education, explanation, and written materials on patient's individual exercise prescription       Expected Outcomes Short Term: Able to explain program exercise prescription;Long Term: Able to explain home exercise prescription to exercise independently              Exercise Goals Re-Evaluation :  Exercise Goals Re-Evaluation    Row Name 07/21/20 1124              Exercise Goal Re-Evaluation   Exercise Goals Review Able to understand and use rate of perceived exertion (RPE) scale;Able to understand and use Dyspnea scale;Knowledge and understanding of Target Heart Rate Range (THRR);Understanding of Exercise Prescription       Comments Reviewed RPE and dyspnea scales, THR and program prescription with pt today.  Pt voiced understanding and was given a copy of goals to take  home.       Expected Outcomes Short: Use RPE daily to regulate intensity. Long: Follow program prescription in THR.              Discharge Exercise Prescription (Final Exercise Prescription Changes):  Exercise Prescription Changes - 07/14/20 1400      Response to Exercise   Blood Pressure (Admit) 120/60    Blood Pressure (Exercise) 134/74    Blood Pressure (Exit) 108/64    Heart Rate (Admit) 74 bpm    Heart Rate (Exercise) 92 bpm    Heart Rate (Exit) 83 bpm    Oxygen Saturation (Admit) 94 %    Oxygen Saturation (Exercise) 89 %    Oxygen Saturation (Exit) 96 %    Rating of Perceived Exertion (Exercise) 11    Perceived Dyspnea (Exercise) 1    Symptoms none           Nutrition:  Target Goals: Understanding of nutrition guidelines, daily intake of sodium <155m, cholesterol <2043m calories 30% from fat and 7% or less from saturated fats, daily to have 5 or more servings of fruits and vegetables.  Education: All About Nutrition: -Group instruction provided by verbal, written material, interactive activities, discussions, models, and posters to present general guidelines for heart healthy nutrition including fat, fiber, MyPlate, the role of sodium in heart healthy nutrition, utilization of the nutrition label, and utilization of this knowledge for meal planning. Follow up email sent as well. Written material given at graduation. Flow[facility]  Date 02/27[facility]17  [facility] HospitalInstruction Review Code 1- Verbalizes  Understanding      Biometrics:  Pre Biometrics - 07/14/20 1450      Pre Biometrics   Height 5' 7.5" (1.715 m)    Weight 165 lb 6.4 oz (75 kg)    BMI (Calculated) 25.51    Single Leg Stand 4.65 seconds            Nutrition Therapy Plan and Nutrition Goals:   Nutrition Assessments:  MEDIFICTS Score Key:  ?70 Need to make dietary changes   40-70 Heart Healthy Diet  ? 40 Therapeutic Level Cholesterol Diet  [facility]  Picture Your Plate Total Score on Admission 56     Picture Your Plate Scores:  <4<56nhealthy dietary pattern with much room for improvement.  41-50 Dietary pattern unlikely to meet recommendations for good health and room for improvement.  51-60 More healthful dietary pattern, with some room for improvement.   >60 Healthy dietary pattern, although there may be some specific behaviors that could be improved.   Nutrition Goals Re-Evaluation:   Nutrition Goals Discharge (Final Nutrition Goals Re-Evaluation):   Psychosocial: Target Goals: Acknowledge presence or absence of significant depression and/or stress, maximize coping skills, provide positive support system. Participant is able to verbalize types and ability to use techniques and skills needed for reducing stress and depression.   Education: Stress, Anxiety, and Depression - Group verbal and visual presentation to define topics covered.  Reviews how body is impacted by stress, anxiety, and depression.  Also discusses healthy ways to reduce stress and to treat/manage anxiety and depression.  Written material given at graduation. Flow[facility]  Date 04/19/17  Educator [facility]CD[facility] OaksInstruction Review Code 1- VeUnited States Steel Corporationnderstanding      Education: Sleep Hygiene -Provides group verbal and written instruction  about how sleep can affect your health.  Define sleep  hygiene, discuss sleep cycles and impact of sleep habits. Review good sleep hygiene tips.    Initial Review & Psychosocial Screening:  Initial Psych Review & Screening - 07/06/20 1539      Initial Review   Current issues with Current Sleep Concerns;Current Depression;History of Depression      Family Dynamics   Good Support System? Yes   daughter     Barriers   Psychosocial barriers to participate in program The patient should benefit from training in stress management and relaxation.      Screening Interventions   Interventions Encouraged to exercise;Provide feedback about the scores to participant;To provide support and resources with identified psychosocial needs    Expected Outcomes Short Term goal: Utilizing psychosocial counselor, staff and physician to assist with identification of specific Stressors or current issues interfering with healing process. Setting desired goal for each stressor or current issue identified.;Long Term Goal: Stressors or current issues are controlled or eliminated.;Short Term goal: Identification and review with participant of any Quality of Life or Depression concerns found by scoring the questionnaire.;Long Term goal: The participant improves quality of Life and PHQ9 Scores as seen by post scores and/or verbalization of changes           Quality of Life Scores:  Scores of 19 and below usually indicate a poorer quality of life in these areas.  A difference of  2-3 points is a clinically meaningful difference.  A difference of 2-3 points in the total score of the Quality of Life Index has been associated with significant improvement in overall quality of life, self-image, physical symptoms, and general health in studies assessing change in quality of life.  PHQ-9: Recent Review Flowsheet Data    Depression screen [facility] 2/9 07/14/2020 05/01/2017 02/27/2017 11/29/2016 11/29/2016   Decreased Interest _0 Down, Depressed, Hopeless _1 PHQ - 2  Score _2 Altered sleeping 0 0 0 3 3   Tired, decreased energy _3 Change in appetite 0 _4 Feeling bad or failure about yourself  0 _5 Trouble concentrating 0 _6 Moving slowly or fidgety/restless 0 0 0 0 0   Suicidal thoughts 0 0 _7 PHQ-9 Score _8 Difficult doing work/chores Not difficult at all Not difficult at all Somewhat difficult - -     Interpretation of Total Score  Total Score Depression Severity:  1-4 = Minimal depression, 5-9 = Mild depression, 10-14 = Moderate depression, 15-19 = Moderately severe depression, 20-27 = Severe depression   Psychosocial Evaluation and Intervention:  Psychosocial Evaluation - 07/06/20 1556      Psychosocial Evaluation & Interventions   Interventions Stress management education;Relaxation education;Encouraged to exercise with the program and follow exercise prescription    Comments Yizel is returning to Pulmonary Rehab with pulmonary HTN. She states she doesn't have stress because she "gets up and lives each day" she also stated that her feelings on depression are "who cares? we get up and do the same thing me and my husband so what difference does it make on my quality of life." She doesn't want to take any more medication even if it does help with mood. When she was in the program  before, the counselor encouraged her to seek help from a therapist for help with depression but she said she could never get a hold of someone. She is seeing her primary doctor next week and said she would mention it to them. Her daughter and son are local and help with whatever she needs. COVID has prevented her from being able to exercise and get out of the house, so she thinks coming to pulmonary rehab will help get her back into a routine even though she is really only coming to please her daughter and doctor.    Expected Outcomes Short: attend pulmonary rehab for education and exercise. Long: develop positive self  care habits.    Continue Psychosocial Services  Follow up required by staff           Psychosocial Re-Evaluation:   Psychosocial Discharge (Final Psychosocial Re-Evaluation):   Education: Education Goals: Education classes will be provided on a weekly basis, covering required topics. Participant will state understanding/return demonstration of topics presented.  Learning Barriers/Preferences:  Learning Barriers/Preferences - 07/06/20 1554      Learning Barriers/Preferences   Learning Barriers None    Learning Preferences None           General Pulmonary Education Topics:  Infection Prevention: - Provides verbal and written material to individual with discussion of infection control including proper hand washing and proper equipment cleaning during exercise session. Flowsheet Row Pulmonary Rehab from 07/23/2020 in [facility] Cardiac and Pulmonary Rehab  Date 07/14/20  Educator AS  Instruction Review Code 1- Verbalizes Understanding      Falls Prevention: - Provides verbal and written material to individual with discussion of falls prevention and safety. Flowsheet Row Pulmonary Rehab from 07/23/2020 in [facility] Cardiac and Pulmonary Rehab  Date 07/14/20  Educator AS  Instruction Review Code 1- Verbalizes Understanding      Chronic Lung Disease Review: - Group verbal instruction with posters, models, PowerPoint presentations and videos,  to review new updates, new respiratory medications, new advancements in procedures and treatments. Providing information on websites and "800" numbers for continued self-education. Includes information about supplement oxygen, available portable oxygen systems, continuous and intermittent flow rates, oxygen safety, concentrators, and Medicare reimbursement for oxygen. Explanation of Pulmonary Drugs, including class, frequency, complications, importance of spacers, rinsing mouth after steroid MDI's, and proper cleaning methods for nebulizers. Review  of basic lung anatomy and physiology related to function, structure, and complications of lung disease. Review of risk factors. Discussion about methods for diagnosing sleep apnea and types of masks and machines for OSA. Includes a review of the use of types of environmental controls: home humidity, furnaces, filters, dust mite/pet prevention, HEPA vacuums. Discussion about weather changes, air quality and the benefits of nasal washing. Instruction on Warning signs, infection symptoms, calling MD promptly, preventive modes, and value of vaccinations. Review of effective airway clearance, coughing and/or vibration techniques. Emphasizing that all should Create an Action Plan. Written material given at graduation. Flowsheet Row Pulmonary Rehab from 06/23/2017 in [facility] Cardiac and Pulmonary Rehab  Date 06/07/17  Educator [facility]  Instruction Review Code 1- Verbalizes Understanding      AED/CPR: - Group verbal and written instruction with the use of models to demonstrate the basic use of the AED with the basic ABC's of resuscitation.    Anatomy and Cardiac Procedures: - Group verbal and visual presentation and models provide information about basic cardiac anatomy and function. Reviews the testing methods done to diagnose heart disease and the outcomes of the test  results. Describes the treatment choices: Medical Management, Angioplasty, or Coronary Bypass Surgery for treating various heart conditions including Myocardial Infarction, Angina, Valve Disease, and Cardiac Arrhythmias.  Written material given at graduation. Flowsheet Row Pulmonary Rehab from 06/23/2017 in [facility] Cardiac and Pulmonary Rehab  Date 04/21/17  Educator [facility]  Instruction Review Code 1- Verbalizes Understanding      Medication Safety: - Group verbal and visual instruction to review commonly prescribed medications for heart and lung disease. Reviews the medication, class of the drug, and side effects. Includes the steps to properly  store meds and maintain the prescription regimen.  Written material given at graduation. Flowsheet Row Pulmonary Rehab from 06/23/2017 in [facility] Cardiac and Pulmonary Rehab  Date 05/12/17  Educator [place]  Instruction Review Code 1- Verbalizes Understanding      Other: -Provides group and verbal instruction on various topics (see comments)   Knowledge Questionnaire Score:  Knowledge Questionnaire Score - 07/14/20 1439      Knowledge Questionnaire Score   Pre Score 13/18  oxygen            Core Components/Risk Factors/Patient Goals at Admission:  Personal Goals and Risk Factors at Admission - 07/14/20 1445      Core Components/Risk Factors/Patient Goals on Admission    Weight Management Weight Maintenance;Yes    Intervention Weight Management: Develop a combined nutrition and exercise program designed to reach desired caloric intake, while maintaining appropriate intake of nutrient and fiber, sodium and fats, and appropriate energy expenditure required for the weight goal.;Weight Management: Provide education and appropriate resources to help participant work on and attain dietary goals.;Weight Management/Obesity: Establish reasonable short term and long term weight goals.    Admit Weight 165 lb 6.4 oz (75 kg)    Goal Weight: Short Term 160 lb (72.6 kg)    Goal Weight: Long Term 160 lb (72.6 kg)    Expected Outcomes Short Term: Continue to assess and modify interventions until short term weight is achieved;Long Term: Adherence to nutrition and physical activity/exercise program aimed toward attainment of established weight goal;Weight Maintenance: Understanding of the daily nutrition guidelines, which includes 25-35% calories from fat, 7% or less cal from saturated fats, less than 270m cholesterol, less than 1.5gm of sodium, & 5 or more servings of fruits and vegetables daily;Understanding recommendations for meals to include 15-35% energy as protein, 25-35% energy from fat, 35-60% energy  from carbohydrates, less than 2043mof dietary cholesterol, 20-35 gm of total fiber daily;Understanding of distribution of calorie intake throughout the day with the consumption of 4-5 meals/snacks    Improve shortness of breath with ADL's Yes    Intervention Provide education, individualized exercise plan and daily activity instruction to help decrease symptoms of SOB with activities of daily living.    Expected Outcomes Short Term: Improve cardiorespiratory fitness to achieve a reduction of symptoms when performing ADLs;Long Term: Be able to perform more ADLs without symptoms or delay the onset of symptoms    Diabetes Yes    Intervention Provide education about signs/symptoms and action to take for hypo/hyperglycemia.;Provide education about proper nutrition, including hydration, and aerobic/resistive exercise prescription along with prescribed medications to achieve blood glucose in normal ranges: Fasting glucose 65-99 mg/dL    Expected Outcomes Short Term: Participant verbalizes understanding of the signs/symptoms and immediate care of hyper/hypoglycemia, proper foot care and importance of medication, aerobic/resistive exercise and nutrition plan for blood glucose control.;Long Term: Attainment of HbA1C < 7%.    Heart Failure Yes    Intervention Provide  a combined exercise and nutrition program that is supplemented with education, support and counseling about heart failure. Directed toward relieving symptoms such as shortness of breath, decreased exercise tolerance, and extremity edema.    Expected Outcomes Improve functional capacity of life;Short term: Attendance in program 2-3 days a week with increased exercise capacity. Reported lower sodium intake. Reported increased fruit and vegetable intake. Reports medication compliance.;Short term: Daily weights obtained and reported for increase. Utilizing diuretic protocols set by physician.;Long term: Adoption of self-care skills and reduction of barriers  for early signs and symptoms recognition and intervention leading to self-care maintenance.    Hypertension Yes    Intervention Provide education on lifestyle modifcations including regular physical activity/exercise, weight management, moderate sodium restriction and increased consumption of fresh fruit, vegetables, and low fat dairy, alcohol moderation, and smoking cessation.;Monitor prescription use compliance.    Expected Outcomes Short Term: Continued assessment and intervention until BP is < 140/74m HG in hypertensive participants. < 130/841mHG in hypertensive participants with diabetes, heart failure or chronic kidney disease.;Long Term: Maintenance of blood pressure at goal levels.    Lipids Yes    Intervention Provide education and support for participant on nutrition & aerobic/resistive exercise along with prescribed medications to achieve LDL <7073mHDL >7m28m  Expected Outcomes Short Term: Participant states understanding of desired cholesterol values and is compliant with medications prescribed. Participant is following exercise prescription and nutrition guidelines.;Long Term: Cholesterol controlled with medications as prescribed, with individualized exercise RX and with personalized nutrition plan. Value goals: LDL < 70mg88mL > 40 mg.           Education:Diabetes - Individual verbal and written instruction to review signs/symptoms of diabetes, desired ranges of glucose level fasting, after meals and with exercise. Acknowledge that pre and post exercise glucose checks will be done for 3 sessions at entry of program. Flow[facility]  Date 07/06/20  Educator [facility] Code 1- Verbalizes Understanding      Know Your Numbers and Heart Failure: - Group verbal and visual instruction to discuss disease risk factors for cardiac and pulmonary disease and treatment options.  Reviews associated critical values  for Overweight/Obesity, Hypertension, Cholesterol, and Diabetes.  Discusses basics of heart failure: signs/symptoms and treatments.  Introduces Heart Failure Zone chart for action plan for heart failure.  Written material given at graduation.   Core Components/Risk Factors/Patient Goals Review:    Core Components/Risk Factors/Patient Goals at Discharge (Final Review):    ITP Comments:  ITP Comments    Row Name 07/06/20 1536 07/14/20 1452 07/21/20 1123 07/29/20 0614     ITP Comments Initial telephone orientation completed. Diagnosis can be found in 11/12. EP orientation scheduled for 2/8 at 1pm Completed 6MWT and gym orientation. Initial ITP created and sent for review to Dr. Mark Emily Filbertical Director. First full day of exercise!  Patient was oriented to gym and equipment including functions, settings, policies, and procedures.  Patient's individual exercise prescription and treatment plan were reviewed.  All starting workloads were established based on the results of the 6 minute walk test done at initial orientation visit.  The plan for exercise progression was also introduced and progression will be customized based on patient's performance and goals. 30 Day review completed. Medical Director ITP review done, changes made as directed, and signed approval by Medical Director.           Comments:

## 2020-07-30 ENCOUNTER — Other Ambulatory Visit: Payer: Self-pay

## 2020-07-30 DIAGNOSIS — I272 Pulmonary hypertension, unspecified: Secondary | ICD-10-CM | POA: Diagnosis not present

## 2020-07-30 LAB — GLUCOSE, CAPILLARY
Glucose-Capillary: 140 mg/dL — ABNORMAL HIGH (ref 70–99)
Glucose-Capillary: 100 mg/dL — ABNORMAL HIGH (ref 70–99)

## 2020-07-30 NOTE — Progress Notes (Signed)
Daily Session Note  Patient Details  Name: Jamie Murray MRN: [id number] Date of Birth: 07/30/1945 Referring Provider:   [name] Pulmonary Rehab from 07/14/2020 in [facility] Cardiac and Pulmonary Rehab  Referring Provider [name]      Encounter Date: 07/30/2020  Check In:  Session Check In - 07/30/20 1031      Check-In   Supervising physician immediately available to respond to emergencies See telemetry face sheet for immediately available ER MD    Location [facility]-Cardiac & Pulmonary Rehab    Staff Present Birdie Sons, MPA, RN;Melissa Caiola RDN, Rowe Pavy, BA, ACSM CEP, Exercise Physiologist    Virtual Visit No    Medication changes reported     No    Fall or balance concerns reported    No    Warm-up and Cool-down Performed on first and last piece of equipment    Resistance Training Performed Yes    VAD Patient? No    PAD/SET Patient? No      Pain Assessment   Currently in Pain? No/denies              Social History   Tobacco Use  Smoking Status Never Smoker  Smokeless Tobacco Never Used    Goals Met:  Independence with exercise equipment Exercise tolerated well No report of cardiac concerns or symptoms Strength training completed today  Goals Unmet:  Not Applicable  Comments: Pt able to follow exercise prescription today without complaint.  Will continue to monitor for progression.     Dr. Emily Filbert is Medical Director for [place] and [facility].

## 2020-08-04 ENCOUNTER — Other Ambulatory Visit: Payer: Self-pay

## 2020-08-04 ENCOUNTER — Encounter: Payer: Medicare PPO | Attending: Cardiology | Admitting: *Deleted

## 2020-08-04 DIAGNOSIS — I272 Pulmonary hypertension, unspecified: Secondary | ICD-10-CM | POA: Insufficient documentation

## 2020-08-04 NOTE — Progress Notes (Signed)
Daily Session Note  Patient Details  Name: Jamie Murray MRN: [id number] Date of Birth: September 28, 1945 Referring Provider:   [name] Pulmonary Rehab from 07/14/2020 in [facility] Cardiac and Pulmonary Rehab  Referring Provider [name]      Encounter Date: 08/04/2020  Check In:  Session Check In - 08/04/20 1120      Check-In   Supervising physician immediately available to respond to emergencies See telemetry face sheet for immediately available ER MD    Location [facility]-Cardiac & Pulmonary Rehab    Staff Present Heath Lark, RN, BSN, CCRP;Melissa [place] RDN, LDN;Joseph [employer], [place], ACSM CEP, Exercise Physiologist    Virtual Visit No    Medication changes reported     No    Fall or balance concerns reported    No    Warm-up and Cool-down Performed on first and last piece of equipment    Resistance Training Performed Yes    VAD Patient? No    PAD/SET Patient? No      Pain Assessment   Currently in Pain? No/denies              Social History   Tobacco Use  Smoking Status Never Smoker  Smokeless Tobacco Never Used    Goals Met:  Proper associated with RPD/PD & O2 Sat Independence with exercise equipment Exercise tolerated well No report of cardiac concerns or symptoms  Goals Unmet:  Not Applicable  Comments: Pt able to follow exercise prescription today without complaint.  Will continue to monitor for progression.    Dr. Emily Filbert is Medical Director for [place] and [facility].

## 2020-08-05 ENCOUNTER — Other Ambulatory Visit: Payer: Self-pay

## 2020-08-05 DIAGNOSIS — I272 Pulmonary hypertension, unspecified: Secondary | ICD-10-CM

## 2020-08-05 NOTE — Progress Notes (Signed)
Completed initial RD evaluation.

## 2020-08-06 ENCOUNTER — Telehealth (HOSPITAL_COMMUNITY): Payer: Self-pay | Admitting: Pharmacy Technician

## 2020-08-06 ENCOUNTER — Other Ambulatory Visit: Payer: Self-pay

## 2020-08-06 DIAGNOSIS — I272 Pulmonary hypertension, unspecified: Secondary | ICD-10-CM | POA: Diagnosis not present

## 2020-08-06 NOTE — Telephone Encounter (Signed)
Spoke with patient's daughter Magdalene Molly, about the patient's [place] assistance. Her current 30 day co-pay is $28.70. Asked Dyann Ruddle (RN), to provide a month of samples along with taking the application to the check in desk for the patient to sign.  Will fax in once all signatures are obtained.

## 2020-08-06 NOTE — Progress Notes (Signed)
Daily Session Note  Patient Details  Name: Jamie Murray MRN: [id number] Date of Birth: [birth year] Referring Provider:   [name] Pulmonary Rehab from 07/14/2020 in [facility] Cardiac and Pulmonary Rehab  Referring Provider [name]      Encounter Date: 08/06/2020  Check In:  Session Check In - 08/06/20 1046      Check-In   Supervising physician immediately available to respond to emergencies See telemetry face sheet for immediately available ER MD    Location [facility]-Cardiac & Pulmonary Rehab    Staff Present Birdie Sons, MPA, RN;Melissa Caiola RDN, LDN;Meredith Sherryll Burger, RN BSN;Jessica Hawkins, MA, RCEP, CCRP, CCET    Virtual Visit No    Medication changes reported     No    Fall or balance concerns reported    No    Warm-up and Cool-down Performed on first and last piece of equipment    Resistance Training Performed Yes    VAD Patient? No    PAD/SET Patient? No      Pain Assessment   Currently in Pain? No/denies              Social History   Tobacco Use  Smoking Status Never Smoker  Smokeless Tobacco Never Used    Goals Met:  Independence with exercise equipment Exercise tolerated well No report of cardiac concerns or symptoms Strength training completed today  Goals Unmet:  Not Applicable  Comments: Pt able to follow exercise prescription today without complaint.  Will continue to monitor for progression.    Dr. Emily Filbert is Medical Director for [place] and [facility].

## 2020-08-11 ENCOUNTER — Other Ambulatory Visit: Payer: Self-pay

## 2020-08-11 DIAGNOSIS — I272 Pulmonary hypertension, unspecified: Secondary | ICD-10-CM

## 2020-08-11 NOTE — Progress Notes (Signed)
Daily Session Note  Patient Details  Name: Jamie Murray MRN: [id number] Date of Birth: 1945/06/14 Referring Provider:   [name] Pulmonary Rehab from 07/14/2020 in [facility] Cardiac and Pulmonary Rehab  Referring Provider [name]      Encounter Date: 08/11/2020  Check In:  Session Check In - 08/11/20 1115      Check-In   Supervising physician immediately available to respond to emergencies See telemetry face sheet for immediately available ER MD    Location [facility]-Cardiac & Pulmonary Rehab    Staff Present Hope Budds RDN, Luther Redo, MPA, Elveria Rising, BA, ACSM CEP, Exercise Physiologist;Joseph Tessie Fass RCP,RRT,BSRT    Virtual Visit No    Medication changes reported     No    Fall or balance concerns reported    No    Warm-up and Cool-down Performed on first and last piece of equipment    Resistance Training Performed Yes    VAD Patient? No    PAD/SET Patient? No      Pain Assessment   Currently in Pain? No/denies              Social History   Tobacco Use  Smoking Status Never Smoker  Smokeless Tobacco Never Used    Goals Met:  Independence with exercise equipment Exercise tolerated well Personal goals reviewed No report of cardiac concerns or symptoms Strength training completed today  Goals Unmet:  Not Applicable  Comments: Pt able to follow exercise prescription today without complaint.  Will continue to monitor for progression.    Dr. Emily Filbert is Medical Director for [place] and [facility].

## 2020-08-12 NOTE — Telephone Encounter (Signed)
Sent in application via fax.  Will follow up.  

## 2020-08-13 ENCOUNTER — Other Ambulatory Visit: Payer: Self-pay

## 2020-08-13 DIAGNOSIS — I272 Pulmonary hypertension, unspecified: Secondary | ICD-10-CM | POA: Diagnosis not present

## 2020-08-13 NOTE — Progress Notes (Signed)
Daily Session Note  Patient Details  Name: Jamie Murray MRN: [id number] Date of Birth: [birth year] Referring Provider:   [name] Pulmonary Rehab from 07/14/2020 in [facility] Cardiac and Pulmonary Rehab  Referring Provider [name]      Encounter Date: 08/13/2020  Check In:  Session Check In - 08/13/20 1031      Check-In   Supervising physician immediately available to respond to emergencies See telemetry face sheet for immediately available ER MD    Location [facility]-Cardiac & Pulmonary Rehab    Staff Present Birdie Sons, MPA, Elveria Rising, BA, ACSM CEP, Exercise Physiologist;Joseph Tessie Fass RCP,RRT,BSRT    Virtual Visit No    Medication changes reported     No    Fall or balance concerns reported    No    Warm-up and Cool-down Performed on first and last piece of equipment    Resistance Training Performed Yes    VAD Patient? No    PAD/SET Patient? No      Pain Assessment   Currently in Pain? No/denies              Social History   Tobacco Use  Smoking Status Never Smoker  Smokeless Tobacco Never Used    Goals Met:  Independence with exercise equipment Exercise tolerated well No report of cardiac concerns or symptoms Strength training completed today  Goals Unmet:  Not Applicable  Comments: Pt able to follow exercise prescription today without complaint.  Will continue to monitor for progression.    Dr. Emily Filbert is Medical Director for [place] and [facility].

## 2020-08-14 ENCOUNTER — Other Ambulatory Visit (HOSPITAL_COMMUNITY): Payer: Self-pay

## 2020-08-14 MED ORDER — UPTRAVI 200 MCG PO TABS
ORAL_TABLET | ORAL | 11 refills | Status: DC
Start: 2020-08-14 — End: 2020-08-25

## 2020-08-17 NOTE — Telephone Encounter (Signed)
Called [employer] to check the status of the patient's application. Representative stated that we should have a determination in the next 24 hours and would receive a fax with the approval or denial.

## 2020-08-18 ENCOUNTER — Other Ambulatory Visit: Payer: Self-pay

## 2020-08-18 DIAGNOSIS — I272 Pulmonary hypertension, unspecified: Secondary | ICD-10-CM | POA: Diagnosis not present

## 2020-08-18 NOTE — Progress Notes (Signed)
Daily Session Note  Patient Details  Name: Jamie Murray MRN: [id number] Date of Birth: [birth year] Referring Provider:   [name] Pulmonary Rehab from 07/14/2020 in [facility] Cardiac and Pulmonary Rehab  Referring Provider [name]      Encounter Date: 08/18/2020  Check In:  Session Check In - 08/18/20 1100      Check-In   Supervising physician immediately available to respond to emergencies See telemetry face sheet for immediately available ER MD    Location [facility]-Cardiac & Pulmonary Rehab    Staff Present Birdie Sons, MPA, Elveria Rising, BA, ACSM CEP, Exercise Physiologist;Kara Eliezer Bottom, MS Exercise Physiologist;Melissa Caiola RDN, LDN    Virtual Visit No    Medication changes reported     No    Fall or balance concerns reported    No    Warm-up and Cool-down Performed on first and last piece of equipment    Resistance Training Performed Yes    VAD Patient? No    PAD/SET Patient? No      Pain Assessment   Currently in Pain? No/denies              Social History   Tobacco Use  Smoking Status Never Smoker  Smokeless Tobacco Never Used    Goals Met:  Independence with exercise equipment Exercise tolerated well No report of cardiac concerns or symptoms Strength training completed today  Goals Unmet:  Not Applicable  Comments: Pt able to follow exercise prescription today without complaint.  Will continue to monitor for progression. Reviewed home exercise with pt today.  Pt plans to use elliptical and walk for exercise.  Reviewed THR, pulse, RPE, sign and symptoms, pulse oximetery and when to call 911 or MD.  Also discussed weather considerations and indoor options.  Pt voiced understanding.    Dr. Emily Filbert is Medical Director for [place] and [facility].

## 2020-08-18 NOTE — Telephone Encounter (Signed)
Advanced Heart Failure Patient Advocate Encounter   Patient was approved to receive Farxiga from [employer]  Patient ID: [id number] Effective dates: 08/17/20 through 06/05/21  Called and spoke with Magdalene Molly, patient's daughter regarding approval.  Charlann Boxer, CPhT

## 2020-08-21 ENCOUNTER — Telehealth (HOSPITAL_COMMUNITY): Payer: Self-pay | Admitting: *Deleted

## 2020-08-21 NOTE — Telephone Encounter (Signed)
Received a VM from pts daughter that she was having side effects from tadalafil. I called pt back to get more information no answer.

## 2020-08-24 DIAGNOSIS — R768 Other specified abnormal immunological findings in serum: Secondary | ICD-10-CM | POA: Insufficient documentation

## 2020-08-25 ENCOUNTER — Other Ambulatory Visit (HOSPITAL_COMMUNITY): Payer: Self-pay | Admitting: *Deleted

## 2020-08-25 NOTE — Telephone Encounter (Addendum)
pts daughter left vm stating pts endocrinologist stopped her farxiga because A1C was 8.1. Also wanted to inform us pt is on max dose of uptravi 1600 mcg bid. Pt wants to know if she should increase her lasix from every other day to daily. She has noticed since stopping farxiga she has retained some fluid and is up about 4lbs.   Routed to [place] for advice

## 2020-08-26 ENCOUNTER — Encounter: Payer: Self-pay | Admitting: *Deleted

## 2020-08-26 DIAGNOSIS — I272 Pulmonary hypertension, unspecified: Secondary | ICD-10-CM

## 2020-08-26 NOTE — Progress Notes (Signed)
Pulmonary Individual Treatment Plan  Patient Details  Name: Jamie Murray MRN: [id number] Date of Birth: Jun 27, 1945 Referring Provider:   [name] Pulmonary Rehab from 07/14/2020 in [facility]monary Rehab  Referring Provider [name]      Initial Encounter Date:  Flowsheet Row Pulmonary Rehab from 07/14/2020 in [facility] Cardiac and Pulmonary Rehab  Date 07/14/20      Visit Diagnosis: Pulmonary hypertension ([place])  Patient's Home Medications on Admission:  Current Outpatient Medications:  .  ambrisentan (LETAIRIS) 10 MG tablet, Take 1 tablet (10 mg total) by mouth daily., Disp: 30 tablet, Rfl: 11 .  amLODipine (NORVASC) 10 MG tablet, Take 1 tablet (10 mg total) by mouth daily., Disp: 30 tablet, Rfl: 6 .  aspirin 81 MG tablet, Take 81 mg by mouth daily., Disp: , Rfl:  .  clonazePAM (KLONOPIN) 1 MG tablet, Take 1 mg by mouth daily., Disp: , Rfl:  .  Fe Fum-FA-B Cmp-C-Zn-Mg-Mn-Cu (HEMOCYTE PLUS) 106-1 MG CAPS, Take 1 capsule by mouth daily., Disp: , Rfl: 6 .  furosemide (LASIX) 20 MG tablet, Take 1 tablet (20 mg total) by mouth every other day., Disp: 30 tablet, Rfl: 6 .  hydrALAZINE (APRESOLINE) 50 MG tablet, Take 1 tablet (50 mg total) by mouth every 8 (eight) hours., Disp: 90 tablet, Rfl: 0 .  insulin aspart (NOVOLOG) 100 UNIT/ML FlexPen, Inject 4 Units into the skin 3 (three) times daily with meals., Disp: 15 mL, Rfl: 0 .  insulin degludec (TRESIBA) 200 UNIT/ML FlexTouch Pen, Inject 26 Units into the skin at bedtime., Disp: , Rfl:  .  levothyroxine (SYNTHROID) 75 MCG tablet, Take 75 mcg by mouth daily before breakfast., Disp: , Rfl:  .  loratadine (CLARITIN) 10 MG tablet, Take 10 mg by mouth daily. , Disp: , Rfl:  .  meclizine (ANTIVERT) 25 MG tablet, Take 25 mg by mouth 3 (three) times daily as needed for dizziness. , Disp: , Rfl:  .  metoprolol succinate (TOPROL-XL) 50 MG 24 hr tablet, Take 50 mg by mouth daily. Take with or immediately following a meal., Disp: , Rfl:   .  Multiple Vitamins-Minerals (MULTI ADULT GUMMIES) CHEW, Chew by mouth daily., Disp: , Rfl:  .  potassium chloride (KLOR-CON) 10 MEQ tablet, Take 1 tablet (10 mEq total) by mouth every other day., Disp: 15 tablet, Rfl: 6 .  rosuvastatin (CRESTOR) 10 MG tablet, Take 10 mg by mouth daily., Disp: , Rfl:  .  Selexipag (UPTRAVI) 200 MCG TABS, Take 8 tablets (1,600 mcg total) by mouth in the morning and at bedtime. As directed by clinic, Disp: 60 tablet, Rfl: 11 .  tadalafil, PAH, (ADCIRCA) 20 MG tablet, Take 1 tablet (20 mg total) by mouth daily., Disp: 60 tablet, Rfl: 11  Past Medical History: Past Medical History:  Diagnosis Date  . Allergy   . Anemia   . Anxiety   . COPD (chronic obstructive pulmonary disease) ([place])   . Depression   . Diabetes ([place])   . GERD (gastroesophageal reflux disease)   . Hyperlipidemia   . Hypertension   . Insomnia   . Migraines   . Osteoporosis   . [facility] spotted fever   . Transient cerebral ischemia   . Vertigo    every 2-3 months    Tobacco Use: Social History   Tobacco Use  Smoking Status Never Smoker  Smokeless Tobacco Never Used    Labs: Recent Review Scientist, physiological    Labs for ITP Cardiac and Pulmonary Rehab Latest Ref  Rng & Units 03/07/2016 06/07/2016 11/29/2016 01/09/2020 07/03/2020   Cholestrol 0 - 200 mg/dL 145 213(H) 95(L) 128 121   LDLCALC 0 - 99 mg/dL 77 143(H) 45 68 55   HDL >40 mg/dL 43 37(L) 29(L) 24(L) 43   Trlycerides <150 mg/dL 123 164(H) 105 178(H) 115   Hemoglobin A1c 4.8 - 5.6 % 7.8(H) 8.5(H) 7.1(H) 10.9(H) -       Pulmonary Assessment Scores:  Pulmonary Assessment Scores    Row Name 07/14/20 1440         ADL UCSD   SOB Score total 33     Rest 3     Walk 3     Stairs 5     Bath 0     Dress 0     Shop 0           CAT Score   CAT Score 19           mMRC Score   mMRC Score 2            UCSD: Self-administered rating of dyspnea associated with activities of daily living (ADLs) 6-point scale (0 =  "not at all" to 5 = "maximal or unable to do because of breathlessness")  Scoring Scores range from 0 to 120.  Minimally important difference is 5 units  CAT: CAT can identify the health impairment of COPD patients and is better correlated with disease progression.  CAT has a scoring range of zero to 40. The CAT score is classified into four groups of low (less than 10), medium (10 - 20), high (21-30) and very high (31-40) based on the impact level of disease on health status. A CAT score over 10 suggests significant symptoms.  A worsening CAT score could be explained by an exacerbation, poor medication adherence, poor inhaler technique, or progression of COPD or comorbid conditions.  CAT MCID is 2 points  mMRC: mMRC (Modified Medical Research Council) Dyspnea Scale is used to assess the degree of baseline functional disability in patients of respiratory disease due to dyspnea. No minimal important difference is established. A decrease in score of 1 point or greater is considered a positive change.   Pulmonary Function Assessment:   Exercise Target Goals: Exercise Program Goal: Individual exercise prescription set using results from initial 6 min walk test and THRR while considering  patient's activity barriers and safety.   Exercise Prescription Goal: Initial exercise prescription builds to 30-45 minutes a day of aerobic activity, 2-3 days per week.  Home exercise guidelines will be given to patient during program as part of exercise prescription that the participant will acknowledge.  Education: Aerobic Exercise: - Group verbal and visual presentation on the components of exercise prescription. Introduces F.I.T.T principle from ACSM for exercise prescriptions.  Reviews F.I.T.T. principles of aerobic exercise including progression. Written material given at graduation. Flowsheet Row Pulmonary Rehab from 08/13/2020 in [facility] Cardiac and Pulmonary Rehab  Date 07/23/20  Educator jh  Instruction  Review Code 1- Verbalizes Understanding      Education: Resistance Exercise: - Group verbal and visual presentation on the components of exercise prescription. Introduces F.I.T.T principle from ACSM for exercise prescriptions  Reviews F.I.T.T. principles of resistance exercise including progression. Written material given at graduation. Flowsheet Row Pulmonary Rehab from 08/13/2020 in [facility] Cardiac and Pulmonary Rehab  Date 07/30/20  Educator [facility]  Instruction Review Code 1- Verbalizes Understanding       Education: Exercise & Equipment Safety: - Individual verbal instruction and demonstration of  equipment use and safety with use of the equipment. Flowsheet Row Pulmonary Rehab from 08/13/2020 in [facility] Cardiac and Pulmonary Rehab  Date 07/14/20  Educator AS  Instruction Review Code 1- Verbalizes Understanding      Education: Exercise Physiology & General Exercise Guidelines: - Group verbal and written instruction with models to review the exercise physiology of the cardiovascular system and associated critical values. Provides general exercise guidelines with specific guidelines to those with heart or lung disease.    Education: Flexibility, Balance, Mind/Body Relaxation: - Group verbal and visual presentation with interactive activity on the components of exercise prescription. Introduces F.I.T.T principle from ACSM for exercise prescriptions. Reviews F.I.T.T. principles of flexibility and balance exercise training including progression. Also discusses the mind body connection.  Reviews various relaxation techniques to help reduce and manage stress (i.e. Deep breathing, progressive muscle relaxation, and visualization). Balance handout provided to take home. Written material given at graduation. Flowsheet Row Pulmonary Rehab from 08/13/2020 in [facility] Cardiac and Pulmonary Rehab  Date 08/06/20  Educator AS  Instruction Review Code 1- Verbalizes Understanding      Activity Barriers & Risk  Stratification:  Activity Barriers & Cardiac Risk Stratification - 07/06/20 1534      Activity Barriers & Cardiac Risk Stratification   Activity Barriers Back Problems;Muscular Weakness;Assistive Device   cane          6 Minute Walk:  6 Minute Walk    Row Name 07/14/20 1427         6 Minute Walk   Distance 780 feet     Walk Time 6 minutes     # of Rest Breaks 0     MPH 1.47     METS 1.9     RPE 11     Perceived Dyspnea  1     VO2 Peak 6.63     Symptoms No     Resting HR 74 bpm     Resting BP 120/60     Resting Oxygen Saturation  94 %     Exercise Oxygen Saturation  during 6 min walk 89 %     Max Ex. HR 92 bpm     Max Ex. BP 134/74     2 Minute Post BP 108/64           Interval HR   1 Minute HR 80     2 Minute HR 85     3 Minute HR 86     4 Minute HR 89     5 Minute HR 92     6 Minute HR 89     2 Minute Post HR 83     Interval Heart Rate? Yes           Interval Oxygen   Interval Oxygen? Yes     Baseline Oxygen Saturation % 94 %     1 Minute Oxygen Saturation % 98 %     1 Minute Liters of Oxygen 0 L     2 Minute Oxygen Saturation % 96 %     2 Minute Liters of Oxygen 0 L     3 Minute Oxygen Saturation % 93 %     3 Minute Liters of Oxygen 0 L     4 Minute Oxygen Saturation % 95 %     4 Minute Liters of Oxygen 0 L     5 Minute Oxygen Saturation % 89 %     5 Minute Liters of Oxygen 0 L  6 Minute Oxygen Saturation % 91 %     6 Minute Liters of Oxygen 0 L     2 Minute Post Oxygen Saturation % 96 %     2 Minute Post Liters of Oxygen 0 L           Oxygen Initial Assessment:  Oxygen Initial Assessment - 07/06/20 1604      Home Oxygen   Home Oxygen Device None    Sleep Oxygen Prescription CPAP    Home Exercise Oxygen Prescription None    Home Resting Oxygen Prescription None    Compliance with Home Oxygen Use No   issues with CPAP compliance due to congestion for last 2 months     Initial 6 min Walk   Oxygen Used None      Program Oxygen  Prescription   Program Oxygen Prescription None      Intervention   Short Term Goals To learn and understand importance of monitoring SPO2 with pulse oximeter and demonstrate accurate use of the pulse oximeter.;To learn and understand importance of maintaining oxygen saturations>88%;To learn and demonstrate proper pursed lip breathing techniques or other breathing techniques.;To learn and demonstrate proper use of respiratory medications    Long  Term Goals Verbalizes importance of monitoring SPO2 with pulse oximeter and return demonstration;Compliance with respiratory medication;Exhibits proper breathing techniques, such as pursed lip breathing or other method taught during program session;Maintenance of O2 saturations>88%           Oxygen Re-Evaluation:  Oxygen Re-Evaluation    Row Name 07/21/20 1125 08/11/20 1113           Program Oxygen Prescription   Program Oxygen Prescription None None             Home Oxygen   Home Oxygen Device None None      Sleep Oxygen Prescription CPAP CPAP      Liters per minute 0 --      Home Exercise Oxygen Prescription None None      Home Resting Oxygen Prescription None None      Compliance with Home Oxygen Use Yes Yes             Goals/Expected Outcomes   Short Term Goals To learn and demonstrate proper pursed lip breathing techniques or other breathing techniques. To learn and demonstrate proper pursed lip breathing techniques or other breathing techniques.;To learn and understand importance of maintaining oxygen saturations>88%;To learn and understand importance of monitoring SPO2 with pulse oximeter and demonstrate accurate use of the pulse oximeter.      Long  Term Goals -- Verbalizes importance of monitoring SPO2 with pulse oximeter and return demonstration;Maintenance of O2 saturations>88%;Exhibits proper breathing techniques, such as pursed lip breathing or other method taught during program session;Compliance with respiratory medication       Comments Reviewed PLB technique with pt.  Talked about how it works and it's importance in maintaining their exercise saturations. Tekesha is going to purchase a pulse oximeter to check her oxygen at home. Informed her the importance of having a pulse oximeter and keeping her oxygen above 88 percent.      Goals/Expected Outcomes Short: Become more profiecient at using PLB.   Long: Become independent at using PLB. Short: purchase a pulse oximeter. Long: maintain oxygen saturations of 88 percent and above independently             Oxygen Discharge (Final Oxygen Re-Evaluation):  Oxygen Re-Evaluation - 08/11/20 1113      Program Oxygen  Prescription   Program Oxygen Prescription None      Home Oxygen   Home Oxygen Device None    Sleep Oxygen Prescription CPAP    Home Exercise Oxygen Prescription None    Home Resting Oxygen Prescription None    Compliance with Home Oxygen Use Yes      Goals/Expected Outcomes   Short Term Goals To learn and demonstrate proper pursed lip breathing techniques or other breathing techniques.;To learn and understand importance of maintaining oxygen saturations>88%;To learn and understand importance of monitoring SPO2 with pulse oximeter and demonstrate accurate use of the pulse oximeter.    Long  Term Goals Verbalizes importance of monitoring SPO2 with pulse oximeter and return demonstration;Maintenance of O2 saturations>88%;Exhibits proper breathing techniques, such as pursed lip breathing or other method taught during program session;Compliance with respiratory medication    Comments Clarabell is going to purchase a pulse oximeter to check her oxygen at home. Informed her the importance of having a pulse oximeter and keeping her oxygen above 88 percent.    Goals/Expected Outcomes Short: purchase a pulse oximeter. Long: maintain oxygen saturations of 88 percent and above independently           Initial Exercise Prescription:  Initial Exercise Prescription -  07/14/20 1400      Date of Initial Exercise RX and Referring Provider   Date 07/14/20    Referring Provider [name]      Treadmill   MPH 1.4    Grade 0    Minutes 15    METs 2      Recumbant Bike   Level 1    RPM 60    Minutes 15    METs 2      NuStep   Level 1    SPM 80    Minutes 15    METs 2      Recumbant Elliptical   Level 1    RPM 50    Minutes 15    METs 2      REL-XR   Level 1    Speed 50    Minutes 15    METs 2      Prescription Details   Frequency (times per week) 3    Duration Progress to 30 minutes of continuous aerobic without signs/symptoms of physical distress      Intensity   THRR 40-80% of Max Heartrate 103-132    Ratings of Perceived Exertion 11-13    Perceived Dyspnea 0-4      Progression   Progression Continue to progress workloads to maintain intensity without signs/symptoms of physical distress.      Resistance Training   Training Prescription Yes    Weight 2 lb    Reps 10-15           Perform Capillary Blood Glucose checks as needed.  Exercise Prescription Changes:  Exercise Prescription Changes    Row Name 07/14/20 1400 07/29/20 1000 08/11/20 1400 08/25/20 1500       Response to Exercise   Blood Pressure (Admit) 120/60 110/60 118/70 116/78    Blood Pressure (Exercise) 134/74 122/60 124/60 124/68    Blood Pressure (Exit) 108/64 108/62 118/64 110/50    Heart Rate (Admit) 74 bpm 69 bpm 72 bpm 67 bpm    Heart Rate (Exercise) 92 bpm 75 bpm 78 bpm 80 bpm    Heart Rate (Exit) 83 bpm 64 bpm 69 bpm 66 bpm    Oxygen Saturation (Admit) 94 % 95 % 95 % 98 %  Oxygen Saturation (Exercise) 89 % 87 % 93 % 97 %    Oxygen Saturation (Exit) 96 % 93 % 94 % 92 %    Rating of Perceived Exertion (Exercise) _0 Perceived Dyspnea (Exercise) 1 1 0 4    Symptoms none SOB -- SOB on treadmill    Comments -- second full day of exercise -- --    Duration -- Continue with 30 min of aerobic exercise without signs/symptoms of physical  distress. Continue with 30 min of aerobic exercise without signs/symptoms of physical distress. Continue with 30 min of aerobic exercise without signs/symptoms of physical distress.    Intensity -- THRR unchanged THRR unchanged THRR unchanged         Progression   Progression -- Continue to progress workloads to maintain intensity without signs/symptoms of physical distress. Continue to progress workloads to maintain intensity without signs/symptoms of physical distress. Continue to progress workloads to maintain intensity without signs/symptoms of physical distress.    Average METs -- 1.86 2.47 2.2         Resistance Training   Training Prescription -- Yes Yes Yes    Weight -- 2 lb 2 lb 2 lb    Reps -- 10-15 10-15 10-15         Interval Training   Interval Training -- No No No         Treadmill   MPH -- 1.4 -- 1.4    Grade -- 0 -- 0    Minutes -- 15 -- 15    METs -- 2.07 -- 2.07         Recumbant Bike   Level -- -- 1 1    RPM -- -- 60 --    Watts -- -- -- 15    Minutes -- -- 15 15    METs -- -- 3.04 2.54         NuStep   Level -- -- 3 2    Minutes -- -- 15 15    METs -- -- 1.9 1.9         Recumbant Elliptical   Level -- 1 -- --    Minutes -- 15 -- --    METs -- 1.1 -- --         REL-XR   Level -- 1 -- --    Minutes -- 30 -- --    METs -- 2.4 -- --         Home Exercise Plan   Plans to continue exercise at -- -- -- Home (comment)  walking, elliptical    Frequency -- -- -- Add 2 additional days to program exercise sessions.    Initial Home Exercises Provided -- -- -- 08/18/20           Exercise Comments:  Exercise Comments    Row Name 07/21/20 1123           Exercise Comments First full day of exercise!  Patient was oriented to gym and equipment including functions, settings, policies, and procedures.  Patient's individual exercise prescription and treatment plan were reviewed.  All starting workloads were established based on the results of the 6  minute walk test done at initial orientation visit.  The plan for exercise progression was also introduced and progression will be customized based on patient's performance and goals.              Exercise Goals and Review:  Exercise Goals  [place] Name 07/14/20 1447             Exercise Goals   Increase Physical Activity Yes       Intervention Provide advice, education, support and counseling about physical activity/exercise needs.;Develop an individualized exercise prescription for aerobic and resistive training based on initial evaluation findings, risk stratification, comorbidities and participant's personal goals.       Expected Outcomes Short Term: Attend rehab on a regular basis to increase amount of physical activity.;Long Term: Add in home exercise to make exercise part of routine and to increase amount of physical activity.;Long Term: Exercising regularly at least 3-5 days a week.       Increase Strength and Stamina Yes       Intervention Provide advice, education, support and counseling about physical activity/exercise needs.;Develop an individualized exercise prescription for aerobic and resistive training based on initial evaluation findings, risk stratification, comorbidities and participant's personal goals.       Expected Outcomes Short Term: Increase workloads from initial exercise prescription for resistance, speed, and METs.;Short Term: Perform resistance training exercises routinely during rehab and add in resistance training at home;Long Term: Improve cardiorespiratory fitness, muscular endurance and strength as measured by increased METs and functional capacity (6MWT)       Able to understand and use rate of perceived exertion (RPE) scale Yes       Intervention Provide education and explanation on how to use RPE scale       Expected Outcomes Short Term: Able to use RPE daily in rehab to express subjective intensity level;Long Term:  Able to use RPE to guide intensity level  when exercising independently       Able to understand and use Dyspnea scale Yes       Intervention Provide education and explanation on how to use Dyspnea scale       Expected Outcomes Short Term: Able to use Dyspnea scale daily in rehab to express subjective sense of shortness of breath during exertion;Long Term: Able to use Dyspnea scale to guide intensity level when exercising independently       Knowledge and understanding of Target Heart Rate Range (THRR) Yes       Intervention Provide education and explanation of THRR including how the numbers were predicted and where they are located for reference       Expected Outcomes Short Term: Able to state/look up THRR;Long Term: Able to use THRR to govern intensity when exercising independently;Short Term: Able to use daily as guideline for intensity in rehab       Able to check pulse independently Yes       Intervention Provide education and demonstration on how to check pulse in carotid and radial arteries.;Review the importance of being able to check your own pulse for safety during independent exercise       Expected Outcomes Short Term: Able to explain why pulse checking is important during independent exercise;Long Term: Able to check pulse independently and accurately       Understanding of Exercise Prescription Yes       Intervention Provide education, explanation, and written materials on patient's individual exercise prescription       Expected Outcomes Short Term: Able to explain program exercise prescription;Long Term: Able to explain home exercise prescription to exercise independently              Exercise Goals Re-Evaluation :  Exercise Goals Re-Evaluation    [place] Name 07/21/20 1124 07/29/20 1004 08/11/20 1116 08/18/20  1134 08/25/20 1535     Exercise Goal Re-Evaluation   Exercise Goals Review Able to understand and use rate of perceived exertion (RPE) scale;Able to understand and use Dyspnea scale;Knowledge and understanding of  Target Heart Rate Range (THRR);Understanding of Exercise Prescription Increase Physical Activity;Increase Strength and Stamina;Understanding of Exercise Prescription Increase Physical Activity;Increase Strength and Stamina;Understanding of Exercise Prescription Increase Physical Activity;Increase Strength and Stamina Increase Physical Activity;Increase Strength and Stamina;Understanding of Exercise Prescription   Comments Reviewed RPE and dyspnea scales, THR and program prescription with pt today.  Pt voiced understanding and was given a copy of goals to take home. Tyyonna is off to a good start in rehab.  She can completed her first two full days of exercise thus far.  We will continue to montior her progress. Chosen uses a recumbant eliptical at home twice a week. She is going to try to go more than 15  minutes on her machine at home. She still needs home exercise and is increases her levels in rehab. Pt able to follow exercise prescription today without complaint.  Will continue to monitor for progression.  Reviewed home exercise with pt today.  Pt plans to use elliptical and walk for exercise.  Reviewed THR, pulse, RPE, sign and symptoms, pulse oximetery and when to call 911 or MD.  Also discussed weather considerations and indoor options.  Pt voiced understanding. Jonae is doing well in rehab.  She has missed her last two session due to GI issues and dental surgery.  She is up to 15 watts on the bike.  We will continue to montior her progress.   Expected Outcomes Short: Use RPE daily to regulate intensity. Long: Follow program prescription in THR. Short: Continue to attend rehab regularly Long: Continue to follow program prescription. Short: increase honme exercise. Long: maintain exercise at home independently Short: monitor HR and O2 when exercising at home Long: maintain exercise on her own Short: Improved attendance again Long: Continue to improve stamina          Discharge Exercise Prescription (Final  Exercise Prescription Changes):  Exercise Prescription Changes - 08/25/20 1500      Response to Exercise   Blood Pressure (Admit) 116/78    Blood Pressure (Exercise) 124/68    Blood Pressure (Exit) 110/50    Heart Rate (Admit) 67 bpm    Heart Rate (Exercise) 80 bpm    Heart Rate (Exit) 66 bpm    Oxygen Saturation (Admit) 98 %    Oxygen Saturation (Exercise) 97 %    Oxygen Saturation (Exit) 92 %    Rating of Perceived Exertion (Exercise) 11    Perceived Dyspnea (Exercise) 4    Symptoms SOB on treadmill    Duration Continue with 30 min of aerobic exercise without signs/symptoms of physical distress.    Intensity THRR unchanged      Progression   Progression Continue to progress workloads to maintain intensity without signs/symptoms of physical distress.    Average METs 2.2      Resistance Training   Training Prescription Yes    Weight 2 lb    Reps 10-15      Interval Training   Interval Training No      Treadmill   MPH 1.4    Grade 0    Minutes 15    METs 2.07      Recumbant Bike   Level 1    Watts 15    Minutes 15    METs 2.54  NuStep   Level 2    Minutes 15    METs 1.9      Home Exercise Plan   Plans to continue exercise at Home (comment)   walking, elliptical   Frequency Add 2 additional days to program exercise sessions.    Initial Home Exercises Provided 08/18/20           Nutrition:  Target Goals: Understanding of nutrition guidelines, daily intake of sodium <1561m, cholesterol <2052m calories 30% from fat and 7% or less from saturated fats, daily to have 5 or more servings of fruits and vegetables.  Education: All About Nutrition: -Group instruction provided by verbal, written material, interactive activities, discussions, models, and posters to present general guidelines for heart healthy nutrition including fat, fiber, MyPlate, the role of sodium in heart healthy nutrition, utilization of the nutrition label, and utilization of this knowledge  for meal planning. Follow up email sent as well. Written material given at graduation. [facility]  Date 08/13/20  [facility] Review Code 1- Verbalizes Understanding      Biometrics:  Pre Biometrics - 07/14/20 1450      Pre Biometrics   Height 5' 7.5" (1.715 m)    Weight 165 lb 6.4 oz (75 kg)    BMI (Calculated) 25.51    Single Leg Stand 4.65 seconds            Nutrition Therapy Plan and Nutrition Goals:  Nutrition Therapy & Goals - 08/05/20 1533      Nutrition Therapy   Diet Heart healthy, low Na, diabetes friendly    Drug/Food Interactions Statins/Certain Fruits    Protein (specify units) 60g    Fiber 25 grams    Whole Grain Foods 3 servings    Saturated Fats 12 max. grams    Fruits and Vegetables 8 servings/day    Sodium 1.5 grams      Personal Nutrition Goals   Nutrition Goal ST: practice counting carbohydrates LT: A1C back to 7    Comments A1C is 8. She is on insulin. B: whole wheat toast (some regular jelly) with tuKuwaitacon and egg - coffee with stevia L: potpie with madarin oranges today D: tossed salad with cheese and processed ham yesterday, today she will have fish (broiled) with sweet potato and slaw. Drinks: water, juice (lemonade and crangrape - 1/2 glass) - with lunch. AM 70, now 128 before eating today - she is normally around ~120. Ashea reported being very confused with macronutrients as well as diabetes friendly eating. Discussed all macronutrients as well as diabetes friendly eating  - patient voiced understanding, will review at goal time and encouraged attending education. She is limiting her sugar sweetened beverages and is giving up soda for LeMalena Edman will use juice as her emergency sugar instead.      Intervention Plan   Intervention Prescribe, educate and counsel regarding individualized specific dietary modifications aiming towards targeted core components such as  weight, hypertension, lipid management, diabetes, heart failure and other comorbidities.;Nutrition handout(s) given to patient.    Expected Outcomes Short Term Goal: Understand basic principles of dietary content, such as calories, fat, sodium, cholesterol and nutrients.;Short Term Goal: A plan has been developed with personal nutrition goals set during dietitian appointment.;Long Term Goal: Adherence to prescribed nutrition plan.           Nutrition Assessments:  MEDIFICTS Score Key:  ?70 Need to make dietary changes   40-70  Heart Healthy Diet  ? 40 Therapeutic Level Cholesterol Diet  Flowsheet Row Pulmonary Rehab from [facility]14/2020 [facility] Pulmonary Rehab  Picture Your Plate Total Score on Admission 56     Picture Your Plate Scores:  <33 Unhealthy dietary pattern with much room for improvement.  41-50 Dietary pattern unlikely to meet recommendations for good health and room for improvement.  51-60 More healthful dietary pattern, with some room for improvement.   >60 Healthy dietary pattern, although there may be some specific behaviors that could be improved.   Nutrition Goals Re-Evaluation:  Nutrition Goals Re-Evaluation    [place] Name 08/18/20 1218             Goals   Comment River reports having problems with reflux right now and is taking tums to combat it. It occurs mostly at night and wakes her up - she eats dinner at 6pm and feels reflux at 12am. Today she had reflux when exercising at 11am - she ate oatmeal at 9am. Suggested that reflux can be cause by a number of things, however, this may be a hunger response - suggested having a small snack before rehab and after dinner to see if this helps at all. She reports she has not chnaged what she was eating - this could also be a stress response.              Nutrition Goals Discharge (Final Nutrition Goals Re-Evaluation):  Nutrition Goals Re-Evaluation - 08/18/20 1218      Goals   Comment Akira reports  having problems with reflux right now and is taking tums to combat it. It occurs mostly at night and wakes her up - she eats dinner at 6pm and feels reflux at 12am. Today she had reflux when exercising at 11am - she ate oatmeal at 9am. Suggested that reflux can be cause by a number of things, however, this may be a hunger response - suggested having a small snack before rehab and after dinner to see if this helps at all. She reports she has not chnaged what she was eating - this could also be a stress response.           Psychosocial: Target Goals: Acknowledge presence or absence of significant depression and/or stress, maximize coping skills, provide positive support system. Participant is able to verbalize types and ability to use techniques and skills needed for reducing stress and depression.   Education: Stress, Anxiety, and Depression - Group verbal and visual presentation to define topics covered.  Reviews how body is impacted by stress, anxiety, and depression.  Also discusses healthy ways to reduce stress and to treat/manage anxiety and depression.  Written material given at graduation. Flowsheet Row Pulmonary Rehab from 06/23/2017 in [facility] Cardiac and Pulmonary Rehab  Date 04/19/17  Educator [facility]  Instruction Review Code 1- [employer] Understanding      Education: Sleep Hygiene -Provides group verbal and written instruction about how sleep can affect your health.  Define sleep hygiene, discuss sleep cycles and impact of sleep habits. Review good sleep hygiene tips.    Initial Review & Psychosocial Screening:  Initial Psych Review & Screening - 07/06/20 1539      Initial Review   Current issues with Current Sleep Concerns;Current Depression;History of Depression      Family Dynamics   Good Support System? Yes   daughter     Barriers   Psychosocial barriers to participate in program The patient should benefit from training in stress management  and relaxation.      Screening  Interventions   Interventions Encouraged to exercise;Provide feedback about the scores to participant;To provide support and resources with identified psychosocial needs    Expected Outcomes Short Term goal: Utilizing psychosocial counselor, staff and physician to assist with identification of specific Stressors or current issues interfering with healing process. Setting desired goal for each stressor or current issue identified.;Long Term Goal: Stressors or current issues are controlled or eliminated.;Short Term goal: Identification and review with participant of any Quality of Life or Depression concerns found by scoring the questionnaire.;Long Term goal: The participant improves quality of Life and PHQ9 Scores as seen by post scores and/or verbalization of changes           Quality of Life Scores:  Scores of 19 and below usually indicate a poorer quality of life in these areas.  A difference of  2-3 points is a clinically meaningful difference.  A difference of 2-3 points in the total score of the Quality of Life Index has been associated with significant improvement in overall quality of life, self-image, physical symptoms, and general health in studies assessing change in quality of life.  PHQ-9: Recent Review Flowsheet Data    Depression screen [facility] 2/9 08/11/2020 07/14/2020 05/01/2017 02/27/2017 11/29/2016   Decreased Interest _0 Down, Depressed, Hopeless 0 _1 PHQ - 2 Score _2 Altered sleeping 2 0 0 0 3   Tired, decreased energy _3 Change in appetite 0 0 _4 Feeling bad or failure about yourself  0 0 _5 Trouble concentrating 0 0 _6 Moving slowly or fidgety/restless 0 0 0 0 0   Suicidal thoughts 0 0 0 2  2   PHQ-9 Score _7 Difficult doing work/chores Not difficult at all Not difficult at all Not difficult at all Somewhat difficult -     Interpretation of Total Score  Total Score Depression Severity:  1-4 = Minimal depression,  5-9 = Mild depression, 10-14 = Moderate depression, 15-19 = Moderately severe depression, 20-27 = Severe depression   Psychosocial Evaluation and Intervention:  Psychosocial Evaluation - 07/06/20 1556      Psychosocial Evaluation & Interventions   Interventions Stress management education;Relaxation education;Encouraged to exercise with the program and follow exercise prescription    Comments Shantara is returning to Pulmonary Rehab with pulmonary HTN. She states she doesn't have stress because she "gets up and lives each day" she also stated that her feelings on depression are "who cares? we get up and do the same thing me and my husband so what difference does it make on my quality of life." She doesn't want to take any more medication even if it does help with mood. When she was in the program before, the counselor encouraged her to seek help from a therapist for help with depression but she said she could never get a hold of someone. She is seeing her primary doctor next week and said she would mention it to them. Her daughter and son are local and help with whatever she needs. COVID has prevented her from being able to exercise and get out of the house, so she thinks coming to pulmonary rehab will help get her back into a routine even though she is really only coming to please her  daughter and doctor.    Expected Outcomes Short: attend pulmonary rehab for education and exercise. Long: develop positive self care habits.    Continue Psychosocial Services  Follow up required by staff           Psychosocial Re-Evaluation:  Psychosocial Re-Evaluation    [place] Name 08/11/20 1123             Psychosocial Re-Evaluation   Current issues with History of Depression;Current Sleep Concerns       Comments Reviewed patient health questionnaire (PHQ-9) with patient for follow up. Previously, patients score indicated signs/symptoms of depression.  Reviewed to see if patient is improving symptom wise while in  program.  Score declined and patient states that it is because she has little energy. Sometimes she has trouble sleeping.       Expected Outcomes Short: Continue to work toward an improvement in [place] scores by attending [facility] regularly. Long: Continue to improve stress and depression coping skills by talking with staff and attending [facility] regularly and work toward a positive mental state.       Interventions Encouraged to attend Pulmonary Rehabilitation for the exercise;Relaxation education       Continue Psychosocial Services  Follow up required by staff              Psychosocial Discharge (Final Psychosocial Re-Evaluation):  Psychosocial Re-Evaluation - 08/11/20 1123      Psychosocial Re-Evaluation   Current issues with History of Depression;Current Sleep Concerns    Comments Reviewed patient health questionnaire (PHQ-9) with patient for follow up. Previously, patients score indicated signs/symptoms of depression.  Reviewed to see if patient is improving symptom wise while in program.  Score declined and patient states that it is because she has little energy. Sometimes she has trouble sleeping.    Expected Outcomes Short: Continue to work toward an improvement in [place] scores by attending [facility] regularly. Long: Continue to improve stress and depression coping skills by talking with staff and attending [facility] regularly and work toward a positive mental state.    Interventions Encouraged to attend Pulmonary Rehabilitation for the exercise;Relaxation education    Continue Psychosocial Services  Follow up required by staff           Education: Education Goals: Education classes will be provided on a weekly basis, covering required topics. Participant will state understanding/return demonstration of topics presented.  Learning Barriers/Preferences:  Learning Barriers/Preferences - 07/06/20 1554      Learning Barriers/Preferences   Learning Barriers None    Learning  Preferences None           General Pulmonary Education Topics:  Infection Prevention: - Provides verbal and written material to individual with discussion of infection control including proper hand washing and proper equipment cleaning during exercise session. Flowsheet Row Pulmonary Rehab from 08/13/2020 in [facility] Cardiac and Pulmonary Rehab  Date 07/14/20  Educator AS  Instruction Review Code 1- Verbalizes Understanding      Falls Prevention: - Provides verbal and written material to individual with discussion of falls prevention and safety. Flowsheet Row Pulmonary Rehab from 08/13/2020 in [facility] Cardiac and Pulmonary Rehab  Date 07/14/20  Educator AS  Instruction Review Code 1- Verbalizes Understanding      Chronic Lung Disease Review: - Group verbal instruction with posters, models, PowerPoint presentations and videos,  to review new updates, new respiratory medications, new advancements in procedures and treatments. Providing information on websites and "800" numbers for continued self-education. Includes information about supplement oxygen, available  portable oxygen systems, continuous and intermittent flow rates, oxygen safety, concentrators, and Medicare reimbursement for oxygen. Explanation of Pulmonary Drugs, including class, frequency, complications, importance of spacers, rinsing mouth after steroid MDI's, and proper cleaning methods for nebulizers. Review of basic lung anatomy and physiology related to function, structure, and complications of lung disease. Review of risk factors. Discussion about methods for diagnosing sleep apnea and types of masks and machines for OSA. Includes a review of the use of types of environmental controls: home humidity, furnaces, filters, dust mite/pet prevention, HEPA vacuums. Discussion about weather changes, air quality and the benefits of nasal washing. Instruction on Warning signs, infection symptoms, calling MD promptly, preventive modes, and  value of vaccinations. Review of effective airway clearance, coughing and/or vibration techniques. Emphasizing that all should Create an Action Plan. Written material given at graduation. Flowsheet Row Pulmonary Rehab from 06/23/2017 in [facility] Cardiac and Pulmonary Rehab  Date 06/07/17  Educator [facility]  Instruction Review Code 1- Verbalizes Understanding      AED/CPR: - Group verbal and written instruction with the use of models to demonstrate the basic use of the AED with the basic ABC's of resuscitation.    Anatomy and Cardiac Procedures: - Group verbal and visual presentation and models provide information about basic cardiac anatomy and function. Reviews the testing methods done to diagnose heart disease and the outcomes of the test results. Describes the treatment choices: Medical Management, Angioplasty, or Coronary Bypass Surgery for treating various heart conditions including Myocardial Infarction, Angina, Valve Disease, and Cardiac Arrhythmias.  Written material given at graduation. Flowsheet Row Pulmonary Rehab from 08/13/2020 in [facility] Cardiac and Pulmonary Rehab  Date 07/30/20  Educator [facility]  Instruction Review Code 1- Verbalizes Understanding      Medication Safety: - Group verbal and visual instruction to review commonly prescribed medications for heart and lung disease. Reviews the medication, class of the drug, and side effects. Includes the steps to properly store meds and maintain the prescription regimen.  Written material given at graduation. Flowsheet Row Pulmonary Rehab from 06/23/2017 in [facility] Cardiac and Pulmonary Rehab  Date 05/12/17  Educator [place]  Instruction Review Code 1- Verbalizes Understanding      Other: -Provides group and verbal instruction on various topics (see comments)   Knowledge Questionnaire Score:  Knowledge Questionnaire Score - 07/14/20 1439      Knowledge Questionnaire Score   Pre Score 13/18  oxygen            Core Components/Risk  Factors/Patient Goals at Admission:  Personal Goals and Risk Factors at Admission - 07/14/20 1445      Core Components/Risk Factors/Patient Goals on Admission    Weight Management Weight Maintenance;Yes    Intervention Weight Management: Develop a combined nutrition and exercise program designed to reach desired caloric intake, while maintaining appropriate intake of nutrient and fiber, sodium and fats, and appropriate energy expenditure required for the weight goal.;Weight Management: Provide education and appropriate resources to help participant work on and attain dietary goals.;Weight Management/Obesity: Establish reasonable short term and long term weight goals.    Admit Weight 165 lb 6.4 oz (75 kg)    Goal Weight: Short Term 160 lb (72.6 kg)    Goal Weight: Long Term 160 lb (72.6 kg)    Expected Outcomes Short Term: Continue to assess and modify interventions until short term weight is achieved;Long Term: Adherence to nutrition and physical activity/exercise program aimed toward attainment of established weight goal;Weight Maintenance: Understanding of the daily nutrition guidelines, which includes  25-35% calories from fat, 7% or less cal from saturated fats, less than 214m cholesterol, less than 1.5gm of sodium, & 5 or more servings of fruits and vegetables daily;Understanding recommendations for meals to include 15-35% energy as protein, 25-35% energy from fat, 35-60% energy from carbohydrates, less than 2023mof dietary cholesterol, 20-35 gm of total fiber daily;Understanding of distribution of calorie intake throughout the day with the consumption of 4-5 meals/snacks    Improve shortness of breath with ADL's Yes    Intervention Provide education, individualized exercise plan and daily activity instruction to help decrease symptoms of SOB with activities of daily living.    Expected Outcomes Short Term: Improve cardiorespiratory fitness to achieve a reduction of symptoms when performing  ADLs;Long Term: Be able to perform more ADLs without symptoms or delay the onset of symptoms    Diabetes Yes    Intervention Provide education about signs/symptoms and action to take for hypo/hyperglycemia.;Provide education about proper nutrition, including hydration, and aerobic/resistive exercise prescription along with prescribed medications to achieve blood glucose in normal ranges: Fasting glucose 65-99 mg/dL    Expected Outcomes Short Term: Participant verbalizes understanding of the signs/symptoms and immediate care of hyper/hypoglycemia, proper foot care and importance of medication, aerobic/resistive exercise and nutrition plan for blood glucose control.;Long Term: Attainment of HbA1C < 7%.    Heart Failure Yes    Intervention Provide a combined exercise and nutrition program that is supplemented with education, support and counseling about heart failure. Directed toward relieving symptoms such as shortness of breath, decreased exercise tolerance, and extremity edema.    Expected Outcomes Improve functional capacity of life;Short term: Attendance in program 2-3 days a week with increased exercise capacity. Reported lower sodium intake. Reported increased fruit and vegetable intake. Reports medication compliance.;Short term: Daily weights obtained and reported for increase. Utilizing diuretic protocols set by physician.;Long term: Adoption of self-care skills and reduction of barriers for early signs and symptoms recognition and intervention leading to self-care maintenance.    Hypertension Yes    Intervention Provide education on lifestyle modifcations including regular physical activity/exercise, weight management, moderate sodium restriction and increased consumption of fresh fruit, vegetables, and low fat dairy, alcohol moderation, and smoking cessation.;Monitor prescription use compliance.    Expected Outcomes Short Term: Continued assessment and intervention until BP is < 140/9023mG in  hypertensive participants. < 130/79m6m in hypertensive participants with diabetes, heart failure or chronic kidney disease.;Long Term: Maintenance of blood pressure at goal levels.    Lipids Yes    Intervention Provide education and support for participant on nutrition & aerobic/resistive exercise along with prescribed medications to achieve LDL <70mg6mL >40mg.34mExpected Outcomes Short Term: Participant states understanding of desired cholesterol values and is compliant with medications prescribed. Participant is following exercise prescription and nutrition guidelines.;Long Term: Cholesterol controlled with medications as prescribed, with individualized exercise RX and with personalized nutrition plan. Value goals: LDL < 70mg, 4m> 40 mg.           Education:Diabetes - Individual verbal and written instruction to review signs/symptoms of diabetes, desired ranges of glucose level fasting, after meals and with exercise. Acknowledge that pre and post exercise glucose checks will be done for 3 sessions at entry of program. Flowsheet [facility]  Date 07/06/20  Educator [facility]  [facility] Review Code 1- Verbalizes Understanding      Know Your Numbers and Heart Failure: - Group verbal and visual instruction  to discuss disease risk factors for cardiac and pulmonary disease and treatment options.  Reviews associated critical values for Overweight/Obesity, Hypertension, Cholesterol, and Diabetes.  Discusses basics of heart failure: signs/symptoms and treatments.  Introduces Heart Failure Zone chart for action plan for heart failure.  Written material given at graduation.   Core Components/Risk Factors/Patient Goals Review:   Goals and Risk Factor Review    Row Name 08/11/20 1119             Core Components/Risk Factors/Patient Goals Review   Personal Goals Review Improve shortness of breath with ADL's       Review Spoke to patient  about their shortness of breath and what they can do to improve. Patient has been informed of breathing techniques when starting the program. Patient is informed to tell staff if they have had any med changes and that certain meds they are taking or not taking can be causing shortness of breath.       Expected Outcomes Short: Attend [facility] regularly to improve shortness of breath with ADL's. Long: maintain independence with ADL's              Core Components/Risk Factors/Patient Goals at Discharge (Final Review):   Goals and Risk Factor Review - 08/11/20 1119      Core Components/Risk Factors/Patient Goals Review   Personal Goals Review Improve shortness of breath with ADL's    Review Spoke to patient about their shortness of breath and what they can do to improve. Patient has been informed of breathing techniques when starting the program. Patient is informed to tell staff if they have had any med changes and that certain meds they are taking or not taking can be causing shortness of breath.    Expected Outcomes Short: Attend [facility] regularly to improve shortness of breath with ADL's. Long: maintain independence with ADL's           ITP Comments:  ITP Comments    Row Name 07/06/20 1536 07/14/20 1452 07/21/20 1123 07/29/20 0614 08/05/20 1532   ITP Comments Initial telephone orientation completed. Diagnosis can be found in 11/12. EP orientation scheduled for 2/8 at 1pm Completed 6MWT and gym orientation. Initial ITP created and sent for review to Dr. Emily Filbert, Medical Director. First full day of exercise!  Patient was oriented to gym and equipment including functions, settings, policies, and procedures.  Patient's individual exercise prescription and treatment plan were reviewed.  All starting workloads were established based on the results of the 6 minute walk test done at initial orientation visit.  The plan for exercise progression was also introduced and progression will be  customized based on patient's performance and goals. 30 Day review completed. Medical Director ITP review done, changes made as directed, and signed approval by Medical Director. Completed Initial RD evaluation   Row Name 08/26/20 0718           ITP Comments 30 Day review completed. Medical Director ITP review done, changes made as directed, and signed approval by Medical Director.              Comments:

## 2020-08-27 ENCOUNTER — Other Ambulatory Visit: Payer: Self-pay

## 2020-08-27 DIAGNOSIS — I272 Pulmonary hypertension, unspecified: Secondary | ICD-10-CM | POA: Diagnosis not present

## 2020-08-27 NOTE — Telephone Encounter (Signed)
Yes, she can increase Lasix back to daily.

## 2020-08-27 NOTE — Telephone Encounter (Signed)
Called pt to advise per Dr.McLean take lasix 66m daily. Pt did not answer/left vm requesting return call.

## 2020-08-27 NOTE — Progress Notes (Signed)
Daily Session Note  Patient Details  Name: Shaylynn Nulty MRN: [id number] Date of Birth: [birth year] Referring Provider:   [name] Pulmonary Rehab from 07/14/2020 in [facility] Cardiac and Pulmonary Rehab  Referring Provider [name]      Encounter Date: 08/27/2020  Check In:  Session Check In - 08/27/20 1037      Check-In   Supervising physician immediately available to respond to emergencies See telemetry face sheet for immediately available ER MD    Location [facility]-Cardiac & Pulmonary Rehab    Staff Present Birdie Sons, MPA, RN;Melissa Caiola RDN, Rowe Pavy, BA, ACSM CEP, Exercise Physiologist    Virtual Visit No    Medication changes reported     No    Fall or balance concerns reported    No    Warm-up and Cool-down Performed on first and last piece of equipment    Resistance Training Performed Yes    VAD Patient? No    PAD/SET Patient? No      Pain Assessment   Currently in Pain? No/denies              Social History   Tobacco Use  Smoking Status Never Smoker  Smokeless Tobacco Never Used    Goals Met:  Independence with exercise equipment Exercise tolerated well No report of cardiac concerns or symptoms Strength training completed today  Goals Unmet:  Not Applicable  Comments: Pt able to follow exercise prescription today without complaint.  Will continue to monitor for progression.    Dr. Emily Filbert is Medical Director for [facility] and [facility].

## 2020-08-31 ENCOUNTER — Telehealth (HOSPITAL_COMMUNITY): Payer: Self-pay | Admitting: *Deleted

## 2020-08-31 MED ORDER — PANTOPRAZOLE SODIUM 40 MG PO TBEC: 40.0000 mg | DELAYED_RELEASE_TABLET | Freq: Every day | ORAL | 6 refills | Status: DC

## 2020-08-31 MED ORDER — DAPAGLIFLOZIN PROPANEDIOL 10 MG PO TABS: 10.0000 mg | ORAL_TABLET | Freq: Every day | ORAL | 3 refills | Status: DC

## 2020-08-31 NOTE — Telephone Encounter (Signed)
She needs to go back to [place].  Stopping Wilder Glade may be why she is retaining fluid.  Ozempic may be causing GERD, need to talk to her PCP about this one.  Can take Protonix 40 mg daily for heartburn symptoms.

## 2020-08-31 NOTE — Telephone Encounter (Signed)
Called patient's daughter Marianna Fuss back [phone]) and relayed Dr. Claris Gladden instructions:   She needs to go back to [place].  Stopping Wilder Glade may be why she is retaining fluid.  Ozempic may be causing GERD, need to talk to her PCP about this one.  Can take Protonix 40 mg daily for heartburn symptoms.   Patient's daughter expressed understanding and was very grateful for call back. Will send in new prescription for pantoprazole 40 mg daily and add Farxiga back to medication list (gets through [facility]). She will restart Wilder Glade and also reach out to her endocrinologist regarding GI side effects of Ozempic.  [name], PharmD, BCPS, BCCP, CPP [facility] Pharmacist [phone]

## 2020-08-31 NOTE — Telephone Encounter (Signed)
Routed to [place] for advice   Note from [name]  patient's daughter called with quite a few issues this morning. She is concerned about heartburn and N/V that she attributes to [place]. She is currently taking 1600 mcg BID of the Uptravi. I am not sure if Dr. [name] would like her to decrease her dose or take OTC medications for this. Complicating the picture is that apparently she was changed from [place] to [facility] for diabetes control, which could be the cause of the N/V. Additionally, she is now complaining about excess fluid.

## 2020-09-01 ENCOUNTER — Other Ambulatory Visit: Payer: Self-pay

## 2020-09-01 DIAGNOSIS — I272 Pulmonary hypertension, unspecified: Secondary | ICD-10-CM

## 2020-09-01 NOTE — Progress Notes (Signed)
Daily Session Note  Patient Details  Name: Jamie Murray MRN: [id number] Date of Birth: 02-24-1946 Referring Provider:   [name] Pulmonary Rehab from 07/14/2020 in [facility] Cardiac and Pulmonary Rehab  Referring Provider [name]      Encounter Date: 09/01/2020  Check In:  Session Check In - 09/01/20 1110      Check-In   Supervising physician immediately available to respond to emergencies See telemetry face sheet for immediately available ER MD    Location [facility]-Cardiac & Pulmonary Rehab    Staff Present Nada Maclachlan, BA, ACSM CEP, Exercise Physiologist;Joseph Hood RCP,RRT,BSRT;Melissa Caiola RDN, Luther Redo, MPA, RN    Virtual Visit No    Medication changes reported     Yes    Comments added medication for indigestion: pantoprazole (protonix)    Fall or balance concerns reported    No    Warm-up and Cool-down Performed on first and last piece of equipment    Resistance Training Performed Yes    VAD Patient? No    PAD/SET Patient? No      Pain Assessment   Currently in Pain? No/denies              Social History   Tobacco Use  Smoking Status Never Smoker  Smokeless Tobacco Never Used    Goals Met:  Independence with exercise equipment Exercise tolerated well No report of cardiac concerns or symptoms Strength training completed today  Goals Unmet:  Not Applicable  Comments: Pt able to follow exercise prescription today without complaint.  Will continue to monitor for progression.    Dr. Emily Filbert is Medical Director for [facility].

## 2020-09-03 ENCOUNTER — Other Ambulatory Visit: Payer: Self-pay

## 2020-09-03 DIAGNOSIS — I272 Pulmonary hypertension, unspecified: Secondary | ICD-10-CM | POA: Diagnosis not present

## 2020-09-03 NOTE — Progress Notes (Signed)
Daily Session Note  Patient Details  Name: Jamie Murray MRN: [id number] Date of Birth: 08/03/1945 Referring Provider:   Flowsheet Row Pulmonary Rehab from 07/14/2020 in [facility] Cardiac and Pulmonary Rehab  Referring Provider McLean      Encounter Date: 09/03/2020  Check In:  Session Check In - 09/03/20 1033      Check-In   Supervising physician immediately available to respond to emergencies See telemetry face sheet for immediately available ER MD    Location [facility]-Cardiac & Pulmonary Rehab    Staff Present Meredith Craven, RN BSN;Colleen Bailey, RN, BSN;Kelly Bollinger, MPA, RN;Amanda Sommer, BA, ACSM CEP, Exercise Physiologist    Virtual Visit No    Medication changes reported     Yes    Comments started esomeprazole 40 mg.    Fall or balance concerns reported    No    Warm-up and Cool-down Performed on first and last piece of equipment    Resistance Training Performed Yes    VAD Patient? No    PAD/SET Patient? No      Pain Assessment   Currently in Pain? No/denies              Social History   Tobacco Use  Smoking Status Never Smoker  Smokeless Tobacco Never Used    Goals Met:  Independence with exercise equipment Exercise tolerated well No report of cardiac concerns or symptoms Strength training completed today  Goals Unmet:  Not Applicable  Comments: Pt able to follow exercise prescription today without complaint.  Will continue to monitor for progression.    Dr. Mark Miller is Medical Director for [facility] and [facility]. 

## 2020-09-10 ENCOUNTER — Encounter: Payer: Medicare PPO | Attending: Cardiology

## 2020-09-10 ENCOUNTER — Other Ambulatory Visit: Payer: Self-pay

## 2020-09-10 DIAGNOSIS — I272 Pulmonary hypertension, unspecified: Secondary | ICD-10-CM | POA: Insufficient documentation

## 2020-09-10 NOTE — Progress Notes (Signed)
Daily Session Note  Patient Details  Name: Jamie Murray MRN: [id number] Date of Birth: Mar 03, 1946 Referring Provider:   April Murray Pulmonary Rehab from 07/14/2020 in [facility] Cardiac and Pulmonary Rehab  Referring Provider Jamie Murray      Encounter Date: 09/10/2020  Check In:  Session Check In - 09/10/20 1105      Check-In   Supervising physician immediately available to respond to emergencies See telemetry face sheet for immediately available ER MD    Location [facility]-Cardiac & Pulmonary Rehab    Staff Present Jamie Murray, MPA, RN;Jamie Murray RDN, LDN;Jamie Murray, [place], RCEP, CCRP, CCET    Virtual Visit No    Medication changes reported     No    Fall or balance concerns reported    No    Warm-up and Cool-down Performed on first and last piece of equipment    Resistance Training Performed Yes    VAD Patient? No    PAD/SET Patient? No      Pain Assessment   Currently in Pain? No/denies              Social History   Tobacco Use  Smoking Status Never Smoker  Smokeless Tobacco Never Used    Goals Met:  Independence with exercise equipment Exercise tolerated well No report of cardiac concerns or symptoms Strength training completed today  Goals Unmet:  Not Applicable  Comments: Pt able to follow exercise prescription today without complaint.  Will continue to monitor for progression.    Dr. Emily Murray is Medical Director for [name].

## 2020-09-11 ENCOUNTER — Encounter (HOSPITAL_COMMUNITY): Payer: Self-pay | Admitting: Cardiology

## 2020-09-11 ENCOUNTER — Other Ambulatory Visit: Payer: Self-pay

## 2020-09-11 ENCOUNTER — Ambulatory Visit (HOSPITAL_COMMUNITY)
Admission: RE | Admit: 2020-09-11 | Discharge: 2020-09-11 | Disposition: A | Discharge status: Home / Self Care | Payer: Medicare PPO | Source: Ambulatory Visit | Attending: Cardiology | Admitting: Cardiology

## 2020-09-11 DIAGNOSIS — N183 Chronic kidney disease, stage 3 unspecified: Secondary | ICD-10-CM | POA: Insufficient documentation

## 2020-09-11 DIAGNOSIS — Z9989 Dependence on other enabling machines and devices: Secondary | ICD-10-CM | POA: Diagnosis not present

## 2020-09-11 DIAGNOSIS — I2721 Secondary pulmonary arterial hypertension: Secondary | ICD-10-CM | POA: Diagnosis not present

## 2020-09-11 DIAGNOSIS — Z79890 Hormone replacement therapy (postmenopausal): Secondary | ICD-10-CM | POA: Insufficient documentation

## 2020-09-11 DIAGNOSIS — Z7982 Long term (current) use of aspirin: Secondary | ICD-10-CM | POA: Diagnosis not present

## 2020-09-11 DIAGNOSIS — I272 Pulmonary hypertension, unspecified: Secondary | ICD-10-CM

## 2020-09-11 DIAGNOSIS — I129 Hypertensive chronic kidney disease with stage 1 through stage 4 chronic kidney disease, or unspecified chronic kidney disease: Secondary | ICD-10-CM | POA: Diagnosis present

## 2020-09-11 DIAGNOSIS — I130 Hypertensive heart and chronic kidney disease with heart failure and stage 1 through stage 4 chronic kidney disease, or unspecified chronic kidney disease: Secondary | ICD-10-CM | POA: Insufficient documentation

## 2020-09-11 DIAGNOSIS — Z794 Long term (current) use of insulin: Secondary | ICD-10-CM | POA: Insufficient documentation

## 2020-09-11 DIAGNOSIS — Z7984 Long term (current) use of oral hypoglycemic drugs: Secondary | ICD-10-CM | POA: Insufficient documentation

## 2020-09-11 DIAGNOSIS — G4733 Obstructive sleep apnea (adult) (pediatric): Secondary | ICD-10-CM | POA: Insufficient documentation

## 2020-09-11 DIAGNOSIS — E1122 Type 2 diabetes mellitus with diabetic chronic kidney disease: Secondary | ICD-10-CM | POA: Insufficient documentation

## 2020-09-11 DIAGNOSIS — I50810 Right heart failure, unspecified: Secondary | ICD-10-CM | POA: Diagnosis not present

## 2020-09-11 DIAGNOSIS — Z79899 Other long term (current) drug therapy: Secondary | ICD-10-CM | POA: Diagnosis not present

## 2020-09-11 DIAGNOSIS — R011 Cardiac murmur, unspecified: Secondary | ICD-10-CM | POA: Diagnosis not present

## 2020-09-11 DIAGNOSIS — J984 Other disorders of lung: Secondary | ICD-10-CM | POA: Insufficient documentation

## 2020-09-11 LAB — BRAIN NATRIURETIC PEPTIDE: B Natriuretic Peptide: 143 pg/mL — ABNORMAL HIGH (ref 0.0–100.0)

## 2020-09-11 LAB — BASIC METABOLIC PANEL
Anion gap: 7 (ref 5–15)
BUN: 20 mg/dL (ref 8–23)
CO2: 25 mmol/L (ref 22–32)
Calcium: 9.1 mg/dL (ref 8.9–10.3)
Chloride: 107 mmol/L (ref 98–111)
Creatinine, Ser: 1.51 mg/dL — ABNORMAL HIGH (ref 0.44–1.00)
GFR, Estimated: 36 mL/min — ABNORMAL LOW (ref >60)
Glucose, Bld: 125 mg/dL — ABNORMAL HIGH (ref 70–99)
Potassium: 3.7 mmol/L (ref 3.5–5.1)
Sodium: 139 mmol/L (ref 135–145)

## 2020-09-11 MED ORDER — TADALAFIL (PAH) 20 MG PO TABS: 40.0000 mg | ORAL_TABLET | Freq: Every day | ORAL | 11 refills | Status: DC

## 2020-09-11 NOTE — Patient Instructions (Addendum)
EKG done today.  6 minute walk test done today.  Labs done today. We will contact you only if your labs are abnormal.  INCREASE Tadalafil to 67m (2 tablets) by mouth daily.  No other medication changes were made. Please continue all current medications as prescribed.  Your physician recommends that you schedule a follow-up appointment in: 3 months with an echo prior to your exam.  Your physician has requested that you have an echocardiogram. Echocardiography is a painless test that uses sound waves to create images of your heart. It provides your doctor with information about the size and shape of your heart and how well your heart's chambers and valves are working. This procedure takes approximately one hour. There are no restrictions for this procedure.  If you have any questions or concerns before your next appointment please send [place] message through [place] call our office at [phone]    TO LEAVE A MESSAGE FOR THE NURSE SELECT OPTION 2, PLEASE LEAVE A MESSAGE INCLUDING: . YOUR NAME . DATE OF BIRTH . CALL BACK NUMBER . REASON FOR CALL**this is important as we prioritize the call backs  YOU WILL RECEIVE A CALL BACK THE SAME DAY AS LONG AS YOU CALL BEFORE 4:00 PM   Do the following things EVERYDAY: 1) Weigh yourself in the morning before breakfast. Write it down and keep it in a log. 2) Take your medicines as prescribed 3) Eat low salt foods--Limit salt (sodium) to 2000 mg per day.  4) Stay as active as you can everyday 5) Limit all fluids for the day to less than 2 liters   [facility] you and your health needs are our priority. As part of our continuing mission to provide you with exceptional heart care, we have created designated Provider Care Teams. These Care Teams include your primary Cardiologist (physician) and Advanced Practice Providers (APPs- Physician Assistants and Nurse Practitioners) who all work together to provide you with the  care you need, when you need it.   You may see any of the following providers on your designated Care Team at your next follow up: .Marland KitchenDr DGlori Bickers. Dr DLoralie Champagne. ADarrick Grinder NP . BLyda Jester PA . LAudry Riles PharmD   Please be sure to bring in all your medications bottles to every appointment.

## 2020-09-11 NOTE — Progress Notes (Signed)
6 Min Walk Test Completed  Pt ambulated 350.62mters O2 Sat ranged 83%-97% on  Room air HR ranged 68-100

## 2020-09-13 NOTE — Progress Notes (Signed)
PCP: [name], FNP HF Cardiology: Dr. [name]  75 y.o. with history of HTN, type 2 diabetes, OSA on CPAP, and pulmonary hypertension/RV failure was referred by Dr. Fletcher Anon for evaluation of pulmonary hypertension. It appears that [name] was diagnosed as early as 2018 by echo.  Echo in 8921 showed PA systolic pressure 85 mmHg.  She says that she was told that the pulmonary hypertension was due to OSA.  She has been compliant with CPAP. She reports exertional dyspnea for about 4 years now.   In 8/21, she was admitted to [facility] with CHF.  Echo showed EF 55-60%, mild LVH, D-shaped septum, severe RV dilation with moderate RV systolic dysfunction, PASP 83, moderate TR, moderate PI, dilated IVC.  RHC/LHC showed no coronary disease, severe pulmonary hypertension with PVR 7.38 and preserved cardiac output.  High resolution CT chest in 9/21 showed no evidence for fibrotic ILD.    She saw rheumatology and was given no definite rheumatological diagnosis.    She returns for followup of pulmonary hypertension. [name] has been increased to 1600 mcg bid and she is tolerating it so far.  Using CPAP regularly.  She is doing pulmonary rehab.  Weight down 1 lb. Breathing has improved gradually.  She is now able to go to the grocery store.  No dyspnea on flat ground, still short of breath with stairs.  Mild lightheadedness if she stands too fast, no falls.  No palpitations, no chest pain.     Labs (8/21): K 3.3, creatinine 1.39, ANA negative, anti-Sm negative, anti-SCL-70 negative, ANCA negative, RF elevated 52 but CCP negative, ESR 67, CRP 2.2 Labs (9/21): K 4.5, creatinine 1.8 => 1.45, anti-centromere Ab negative, BNP 309 Labs (11/21): K 4.1, creatinine 1.47, BNP 590 Labs (1/22): LDL 55, K 3.8, creatinine 1.49  6 minute walk (8/21): 154 m 6 minute walk (11/21): 183 m 6 minute walk (3/22): 351 m  ECG (personally reviewed): NSR, iRBBB, QTc 491 msec  PMH: 1. HTN 2. Type 2 diabetes 3. OSA: Uses CPAP.  4.  GERD 5. Hypothyroidism 6. Pulmonary hypertension/RV failure: Pulmonary hypertension diagnosed 2018 by echo.  - PFTs (6/18): moderate restriction - Echo 2020 with PASP 85 mmHg.  - Echo (8/21): EF 55-60%, mild LVH, D-shaped septum, severe RV dilation with moderate RV systolic dysfunction, PASP 83, moderate TR, moderate PI, dilated IVC.  - LHC/RHC (8/21): Normal coronaries; mean RA 3, PA 82/21 mean 44, mean PCWP 14, CI 2.2, PVR 7.38 WU.  - V/Q scan (8/21): No evidence for chronic PE.  - Serologic workup: ANA negative, anti-Sm negative, anti-SCL-70 negative, ANCA negative, RF elevated 52 but CCP negative, ESR 67, CRP 2.2, anti-centromere negative - High resolution CT chest: No fibrotic ILD.  7. CKD stage 3.   Social History   Socioeconomic History  . Marital status: Married    Spouse name: Jamie Murray  . Number of children: 3  . Years of education: 30  . Highest education level: Not on file  Occupational History    Comment: work part time, day care  Tobacco Use  . Smoking status: Never Smoker  . Smokeless tobacco: Never Used  Vaping Use  . Vaping Use: Never used  Substance and Sexual Activity  . Alcohol use: No    Alcohol/week: 0.0 standard drinks  . Drug use: No  . Sexual activity: Never  Other Topics Concern  . Not on file  Social History Narrative   Lives with husband   Caffeine use- coffee 2 cups daily, Coke once  a week, green tea daily   Social Determinants of Health   Financial Resource Strain: Not on file  Food Insecurity: Not on file  Transportation Needs: Not on file  Physical Activity: Not on file  Stress: Not on file  Social Connections: Not on file  Intimate Partner Violence: Not on file   Family History  Problem Relation Age of Onset  . Diabetes Mother   . Hypertension Mother   . Stroke Mother   . Heart disease Mother        MI  . Cancer Father        pancreatic  . Stroke Father   . Hypertension Sister   . Diabetes Brother   . Diabetes Maternal  Grandfather   . Diabetes Sister   . Diabetes Sister   . Diabetes Brother   . Diabetes Brother    ROS: All systems reviewed and negative except as per HPI.   Current Outpatient Medications  Medication Sig Dispense Refill  . ambrisentan (LETAIRIS) 10 MG tablet Take 1 tablet (10 mg total) by mouth daily. 30 tablet 11  . amLODipine (NORVASC) 10 MG tablet Take 1 tablet (10 mg total) by mouth daily. 30 tablet 6  . aspirin 81 MG tablet Take 81 mg by mouth daily.    . clonazePAM (KLONOPIN) 1 MG tablet Take 1 mg by mouth daily.    . dapagliflozin propanediol (FARXIGA) 10 MG TABS tablet Take 1 tablet (10 mg total) by mouth daily before breakfast. 90 tablet 3  . escitalopram (LEXAPRO) 10 MG tablet Take 10 mg by mouth daily.    Marland Kitchen esomeprazole (NEXIUM) 40 MG capsule Take 40 mg by mouth daily at 12 noon.    . Fe Fum-FA-B Cmp-C-Zn-Mg-Mn-Cu (HEMOCYTE PLUS) 106-1 MG CAPS Take 1 capsule by mouth daily.  6  . furosemide (LASIX) 20 MG tablet Take 1 tablet (20 mg total) by mouth every other day. 30 tablet 6  . hydrALAZINE (APRESOLINE) 50 MG tablet Take 1 tablet (50 mg total) by mouth every 8 (eight) hours. 90 tablet 0  . insulin aspart (NOVOLOG) 100 UNIT/ML FlexPen Inject 4 Units into the skin 3 (three) times daily with meals. (Patient taking differently: Inject 4 Units into the skin 3 (three) times daily with meals. Only for BS greater than 200) 15 mL 0  . insulin degludec (TRESIBA) 200 UNIT/ML FlexTouch Pen Inject 26 Units into the skin at bedtime.    Marland Kitchen levothyroxine (SYNTHROID) 75 MCG tablet Take 75 mcg by mouth daily before breakfast.    . loratadine (CLARITIN) 10 MG tablet Take 10 mg by mouth daily.     . meclizine (ANTIVERT) 25 MG tablet Take 25 mg by mouth 3 (three) times daily as needed for dizziness.     . metoprolol succinate (TOPROL-XL) 50 MG 24 hr tablet Take 50 mg by mouth daily. Take with or immediately following a meal.    . Multiple Vitamins-Minerals (MULTI ADULT GUMMIES) CHEW Chew by mouth  daily.    . potassium chloride (KLOR-CON) 10 MEQ tablet Take 1 tablet (10 mEq total) by mouth every other day. 15 tablet 6  . rosuvastatin (CRESTOR) 10 MG tablet Take 10 mg by mouth daily.    . Selexipag (UPTRAVI) 200 MCG TABS Take 8 tablets (1,600 mcg total) by mouth in the morning and at bedtime. As directed by clinic 60 tablet 11  . tadalafil, PAH, (ADCIRCA) 20 MG tablet Take 2 tablets (40 mg total) by mouth daily. 60 tablet 11   No  current facility-administered medications for this encounter.   BP 112/60   Pulse 70   Wt 74.9 kg (165 lb 3.2 oz)   LMP  (LMP Unknown)   SpO2 99%   BMI 25.49 kg/m  General: NAD Neck: No JVD, no thyromegaly or thyroid nodule.  Lungs: Clear to auscultation bilaterally with normal respiratory effort. CV: Nondisplaced PMI.  Heart regular S1/S2, no N4/B0, 1/6 diastolic murmur.  No peripheral edema.  No carotid bruit.  Normal pedal pulses.  Abdomen: Soft, nontender, no hepatosplenomegaly, no distention.  Skin: Intact without lesions or rashes.  Neurologic: Alert and oriented x 3.  Psych: Normal affect. Extremities: No clubbing or cyanosis.  HEENT: Normal.   Assessment/Plan: 1. HTN: BP controlled.  - Continue amlodipine.  2. OSA: Continue CPAP.    3. Pulmonary HTN/RV failure: Echo in 8/21 with EF 55-60%, mild LVH, D-shaped septum, severe RV dilation with moderate RV systolic dysfunction, PASP 83, moderate TR, moderate PI, dilated IVC.  Suspect long-standing RV dysfunction based on appearance of echo. [place] in 8/21 with severe pulmonary arterial hypertension, preserved cardiac output. PFTs from 6/18 showed moderate restriction.  V/Q scan from 8/21 showed no evidence for chronic PE. She has treated OSA.  Serologic workup was negative except for elevated CRP and ESR and elevated RF (normal CCP).  High resolution CT chest did not show evidence for ILD. I suspect long-standing group 1 PAH. On exam today, she is not volume overloaded.  NYHA class II-III symptoms,  improved.  6 minute walk today showed significant improvement.   - Continue Lasix 20 mg qod, check BMET and BNP.  - Continue ambrisentan 10.  - Increase tadalafil to 40 mg daily.   - Continue current selexipag, in future can increase further to 2400 mcg bid.  - Continue CPAP (OSA likely contributes but certainly cannot explain the extent of her [place]).  - Continue pulmonary rehab.  - Echo at followup appt.   4. Pulmonary insufficiency: 8/21 echo with PI reportedly severe.  I reviewed, looks moderate.  She does have a diastolic murmur.  Suspect related to severe PAH and hopefully will improve with decreasing PA pressure.   Followup in 3 months with echo.   Loralie Champagne 09/13/2020

## 2020-09-15 ENCOUNTER — Other Ambulatory Visit: Payer: Self-pay

## 2020-09-15 DIAGNOSIS — I272 Pulmonary hypertension, unspecified: Secondary | ICD-10-CM

## 2020-09-15 NOTE — Progress Notes (Signed)
Daily Session Note  Patient Details  Name: Jamie Murray MRN: [id number] Date of Birth: 03-31-1946 Referring Provider:   [name] Pulmonary Rehab from 07/14/2020 in [facility] Cardiac and Pulmonary Rehab  Referring Provider [name]      Encounter Date: 09/15/2020  Check In:  Session Check In - 09/15/20 1056      Check-In   Supervising physician immediately available to respond to emergencies See telemetry face sheet for immediately available ER MD    Location [facility]-Cardiac & Pulmonary Rehab    Staff Present Birdie Sons, MPA, RN;Melissa Caiola RDN, Rowe Pavy, BA, ACSM CEP, Exercise Physiologist;Joseph Tessie Fass RCP,RRT,BSRT    Virtual Visit No    Medication changes reported     No    Fall or balance concerns reported    No    Warm-up and Cool-down Performed on first and last piece of equipment    Resistance Training Performed Yes    VAD Patient? No    PAD/SET Patient? No      Pain Assessment   Currently in Pain? No/denies              Social History   Tobacco Use  Smoking Status Never Smoker  Smokeless Tobacco Never Used    Goals Met:  Independence with exercise equipment Exercise tolerated well No report of cardiac concerns or symptoms Strength training completed today  Goals Unmet:  Not Applicable  Comments: Pt able to follow exercise prescription today without complaint.  Will continue to monitor for progression.    Dr. Emily Filbert is Medical Director for [facility] and [facility].

## 2020-09-17 ENCOUNTER — Other Ambulatory Visit: Payer: Self-pay

## 2020-09-17 DIAGNOSIS — I272 Pulmonary hypertension, unspecified: Secondary | ICD-10-CM | POA: Diagnosis not present

## 2020-09-17 NOTE — Progress Notes (Signed)
Daily Session Note  Patient Details  Name: Lylith Bebeau MRN: [id number] Date of Birth: [birth year] Referring Provider:   [name] Pulmonary Rehab from 07/14/2020 in [facility] Cardiac and Pulmonary Rehab  Referring Provider [name]      Encounter Date: 09/17/2020  Check In:  Session Check In - 09/17/20 1042      Check-In   Supervising physician immediately available to respond to emergencies See telemetry face sheet for immediately available ER MD    Location [facility]-Cardiac & Pulmonary Rehab    Staff Present Birdie Sons, MPA, RN;Melissa Caiola RDN, Rowe Pavy, BA, ACSM CEP, Exercise Physiologist    Virtual Visit No    Medication changes reported     No    Fall or balance concerns reported    No    Warm-up and Cool-down Performed on first and last piece of equipment    Resistance Training Performed Yes    VAD Patient? No    PAD/SET Patient? No      Pain Assessment   Currently in Pain? No/denies              Social History   Tobacco Use  Smoking Status Never Smoker  Smokeless Tobacco Never Used    Goals Met:  Independence with exercise equipment Exercise tolerated well No report of cardiac concerns or symptoms Strength training completed today  Goals Unmet:  Not Applicable  Comments: Pt able to follow exercise prescription today without complaint.  Will continue to monitor for progression.    Dr. Emily Filbert is Medical Director for [facility] and [facility].

## 2020-09-22 ENCOUNTER — Other Ambulatory Visit: Payer: Self-pay

## 2020-09-22 ENCOUNTER — Encounter: Payer: Medicare PPO | Admitting: *Deleted

## 2020-09-22 DIAGNOSIS — I272 Pulmonary hypertension, unspecified: Secondary | ICD-10-CM

## 2020-09-22 NOTE — Progress Notes (Signed)
Daily Session Note  Patient Details  Name: Disney Ruggiero MRN: [id number] Date of Birth: [birth year] Referring Provider:   [name] [facility] from 07/14/2020 in [facility]  Referring Provider [name]      Encounter Date: 09/22/2020  Check In:  Session Check In - 09/22/20 1154      Check-In   Supervising physician immediately available to respond to emergencies See telemetry face sheet for immediately available ER MD    Location [facility]    Staff Present Heath Lark, RN, BSN, CCRP;Laureen Owens Shark, BS, RRT, CPFT;Melissa Caiola RDN, LDN;Joseph Craigmont [employer]    Virtual Visit No    Medication changes reported     No    Fall or balance concerns reported    No    Warm-up and Cool-down Performed on first and last piece of equipment    Resistance Training Performed Yes    VAD Patient? No    PAD/SET Patient? No      Pain Assessment   Currently in Pain? No/denies              Social History   Tobacco Use  Smoking Status Never Smoker  Smokeless Tobacco Never Used    Goals Met:  Proper associated with RPD/PD & O2 Sat Independence with exercise equipment Exercise tolerated well No report of cardiac concerns or symptoms  Goals Unmet:  Not Applicable  Comments: Pt able to follow exercise prescription today without complaint.  Will continue to monitor for progression.    Dr. Emily Filbert is Medical Director for [facility].

## 2020-09-23 ENCOUNTER — Encounter: Payer: Self-pay | Admitting: *Deleted

## 2020-09-23 DIAGNOSIS — I272 Pulmonary hypertension, unspecified: Secondary | ICD-10-CM

## 2020-09-23 NOTE — Progress Notes (Signed)
Pulmonary Individual Treatment Plan  Patient Details  Name: Jamie Murray MRN: [id number] Date of Birth: 1946-04-13 Referring Provider:   [name] Pulmonary Rehab from 07/14/2020 in [facility] Cardiac and Pulmonary Rehab  Referring Provider [name]      Initial Encounter Date:  Flowsheet Row Pulmonary Rehab from 07/14/2020 in [facility] Cardiac and Pulmonary Rehab  Date 07/14/20      Visit Diagnosis: Pulmonary hypertension ([place])  Patient's Home Medications on Admission:  Current Outpatient Medications:  .  ambrisentan (LETAIRIS) 10 MG tablet, Take 1 tablet (10 mg total) by mouth daily., Disp: 30 tablet, Rfl: 11 .  amLODipine (NORVASC) 10 MG tablet, Take 1 tablet (10 mg total) by mouth daily., Disp: 30 tablet, Rfl: 6 .  aspirin 81 MG tablet, Take 81 mg by mouth daily., Disp: , Rfl:  .  clonazePAM (KLONOPIN) 1 MG tablet, Take 1 mg by mouth daily., Disp: , Rfl:  .  dapagliflozin propanediol (FARXIGA) 10 MG TABS tablet, Take 1 tablet (10 mg total) by mouth daily before breakfast., Disp: 90 tablet, Rfl: 3 .  escitalopram (LEXAPRO) 10 MG tablet, Take 10 mg by mouth daily., Disp: , Rfl:  .  esomeprazole (NEXIUM) 40 MG capsule, Take 40 mg by mouth daily at 12 noon., Disp: , Rfl:  .  Fe Fum-FA-B Cmp-C-Zn-Mg-Mn-Cu (HEMOCYTE PLUS) 106-1 MG CAPS, Take 1 capsule by mouth daily., Disp: , Rfl: 6 .  furosemide (LASIX) 20 MG tablet, Take 1 tablet (20 mg total) by mouth every other day., Disp: 30 tablet, Rfl: 6 .  hydrALAZINE (APRESOLINE) 50 MG tablet, Take 1 tablet (50 mg total) by mouth every 8 (eight) hours., Disp: 90 tablet, Rfl: 0 .  insulin aspart (NOVOLOG) 100 UNIT/ML FlexPen, Inject 4 Units into the skin 3 (three) times daily with meals. (Patient taking differently: Inject 4 Units into the skin 3 (three) times daily with meals. Only for BS greater than 200), Disp: 15 mL, Rfl: 0 .  insulin degludec (TRESIBA) 200 UNIT/ML FlexTouch Pen, Inject 26 Units into the skin at bedtime., Disp: , Rfl:  .   levothyroxine (SYNTHROID) 75 MCG tablet, Take 75 mcg by mouth daily before breakfast., Disp: , Rfl:  .  loratadine (CLARITIN) 10 MG tablet, Take 10 mg by mouth daily. , Disp: , Rfl:  .  meclizine (ANTIVERT) 25 MG tablet, Take 25 mg by mouth 3 (three) times daily as needed for dizziness. , Disp: , Rfl:  .  metoprolol succinate (TOPROL-XL) 50 MG 24 hr tablet, Take 50 mg by mouth daily. Take with or immediately following a meal., Disp: , Rfl:  .  Multiple Vitamins-Minerals (MULTI ADULT GUMMIES) CHEW, Chew by mouth daily., Disp: , Rfl:  .  potassium chloride (KLOR-CON) 10 MEQ tablet, Take 1 tablet (10 mEq total) by mouth every other day., Disp: 15 tablet, Rfl: 6 .  rosuvastatin (CRESTOR) 10 MG tablet, Take 10 mg by mouth daily., Disp: , Rfl:  .  Selexipag (UPTRAVI) 200 MCG TABS, Take 8 tablets (1,600 mcg total) by mouth in the morning and at bedtime. As directed by clinic, Disp: 60 tablet, Rfl: 11 .  tadalafil, PAH, (ADCIRCA) 20 MG tablet, Take 2 tablets (40 mg total) by mouth daily., Disp: 60 tablet, Rfl: 11  Past Medical History: Past Medical History:  Diagnosis Date  . Allergy   . Anemia   . Anxiety   . COPD (chronic obstructive pulmonary disease) ([place])   . Depression   . Diabetes ([place])   . GERD (gastroesophageal reflux disease)   .  Hyperlipidemia   . Hypertension   . Insomnia   . Migraines   . Osteoporosis   . [facility] spotted fever   . Transient cerebral ischemia   . Vertigo    every 2-3 months    Tobacco Use: Social History   Tobacco Use  Smoking Status Never Smoker  Smokeless Tobacco Never Used    Labs: Recent Review Flowsheet Data    Labs for ITP Cardiac and Pulmonary Rehab Latest Ref Rng & Units 03/07/2016 06/07/2016 11/29/2016 01/09/2020 07/03/2020   Cholestrol 0 - 200 mg/dL 145 213(H) 95(L) 128 121   LDLCALC 0 - 99 mg/dL 77 143(H) 45 68 55   HDL >40 mg/dL 43 37(L) 29(L) 24(L) 43   Trlycerides <150 mg/dL 123 164(H) 105 178(H) 115   Hemoglobin A1c 4.8 - 5.6 % 7.8(H)  8.5(H) 7.1(H) 10.9(H) -       Pulmonary Assessment Scores:  Pulmonary Assessment Scores    Row Name 07/14/20 1440         ADL UCSD   SOB Score total 33     Rest 3     Walk 3     Stairs 5     Bath 0     Dress 0     Shop 0           CAT Score   CAT Score 19           mMRC Score   mMRC Score 2            UCSD: Self-administered rating of dyspnea associated with activities of daily living (ADLs) 6-point scale (0 = "not at all" to 5 = "maximal or unable to do because of breathlessness")  Scoring Scores range from 0 to 120.  Minimally important difference is 5 units  CAT: CAT can identify the health impairment of COPD patients and is better correlated with disease progression.  CAT has a scoring range of zero to 40. The CAT score is classified into four groups of low (less than 10), medium (10 - 20), high (21-30) and very high (31-40) based on the impact level of disease on health status. A CAT score over 10 suggests significant symptoms.  A worsening CAT score could be explained by an exacerbation, poor medication adherence, poor inhaler technique, or progression of COPD or comorbid conditions.  CAT MCID is 2 points  mMRC: mMRC (Modified Medical Research Council) Dyspnea Scale is used to assess the degree of baseline functional disability in patients of respiratory disease due to dyspnea. No minimal important difference is established. A decrease in score of 1 point or greater is considered a positive change.   Pulmonary Function Assessment:   Exercise Target Goals: Exercise Program Goal: Individual exercise prescription set using results from initial 6 min walk test and THRR while considering  patient's activity barriers and safety.   Exercise Prescription Goal: Initial exercise prescription builds to 30-45 minutes a day of aerobic activity, 2-3 days per week.  Home exercise guidelines will be given to patient during program as part of exercise prescription that the  participant will acknowledge.  Education: Aerobic Exercise: - Group verbal and visual presentation on the components of exercise prescription. Introduces F.I.T.T principle from ACSM for exercise prescriptions.  Reviews F.I.T.T. principles of aerobic exercise including progression. Written material given at graduation. Flowsheet Row Pulmonary Rehab from 09/17/2020 in [facility] Cardiac and Pulmonary Rehab  Date 07/23/20  Educator jh  Instruction Review Code 1- Verbalizes Understanding  Education: Resistance Exercise: - Group verbal and visual presentation on the components of exercise prescription. Introduces F.I.T.T principle from ACSM for exercise prescriptions  Reviews F.I.T.T. principles of resistance exercise including progression. Written material given at graduation. Flowsheet Row Pulmonary Rehab from 09/17/2020 in [facility] Cardiac and Pulmonary Rehab  Date 07/30/20  Educator [facility]  Instruction Review Code 1- Verbalizes Understanding       Education: Exercise & Equipment Safety: - Individual verbal instruction and demonstration of equipment use and safety with use of the equipment. Flowsheet Row Pulmonary Rehab from 09/17/2020 in [facility] Cardiac and Pulmonary Rehab  Date 07/14/20  Educator AS  Instruction Review Code 1- Verbalizes Understanding      Education: Exercise Physiology & General Exercise Guidelines: - Group verbal and written instruction with models to review the exercise physiology of the cardiovascular system and associated critical values. Provides general exercise guidelines with specific guidelines to those with heart or lung disease.  Flowsheet Row Pulmonary Rehab from 09/17/2020 in [facility] Cardiac and Pulmonary Rehab  Date 09/17/20  Educator [facility]  Instruction Review Code 1- Verbalizes Understanding      Education: Flexibility, Balance, Mind/Body Relaxation: - Group verbal and visual presentation with interactive activity on the components of exercise prescription. Introduces  F.I.T.T principle from ACSM for exercise prescriptions. Reviews F.I.T.T. principles of flexibility and balance exercise training including progression. Also discusses the mind body connection.  Reviews various relaxation techniques to help reduce and manage stress (i.e. Deep breathing, progressive muscle relaxation, and visualization). Balance handout provided to take home. Written material given at graduation. Flowsheet Row Pulmonary Rehab from 09/17/2020 in [facility] Cardiac and Pulmonary Rehab  Date 08/06/20  Educator AS  Instruction Review Code 1- Verbalizes Understanding      Activity Barriers & Risk Stratification:  Activity Barriers & Cardiac Risk Stratification - 07/06/20 1534      Activity Barriers & Cardiac Risk Stratification   Activity Barriers Back Problems;Muscular Weakness;Assistive Device   cane          6 Minute Walk:  6 Minute Walk    Row Name 07/14/20 1427         6 Minute Walk   Distance 780 feet     Walk Time 6 minutes     # of Rest Breaks 0     MPH 1.47     METS 1.9     RPE 11     Perceived Dyspnea  1     VO2 Peak 6.63     Symptoms No     Resting HR 74 bpm     Resting BP 120/60     Resting Oxygen Saturation  94 %     Exercise Oxygen Saturation  during 6 min walk 89 %     Max Ex. HR 92 bpm     Max Ex. BP 134/74     2 Minute Post BP 108/64           Interval HR   1 Minute HR 80     2 Minute HR 85     3 Minute HR 86     4 Minute HR 89     5 Minute HR 92     6 Minute HR 89     2 Minute Post HR 83     Interval Heart Rate? Yes           Interval Oxygen   Interval Oxygen? Yes     Baseline Oxygen Saturation % 94 %  1 Minute Oxygen Saturation % 98 %     1 Minute Liters of Oxygen 0 L     2 Minute Oxygen Saturation % 96 %     2 Minute Liters of Oxygen 0 L     3 Minute Oxygen Saturation % 93 %     3 Minute Liters of Oxygen 0 L     4 Minute Oxygen Saturation % 95 %     4 Minute Liters of Oxygen 0 L     5 Minute Oxygen Saturation % 89 %     5  Minute Liters of Oxygen 0 L     6 Minute Oxygen Saturation % 91 %     6 Minute Liters of Oxygen 0 L     2 Minute Post Oxygen Saturation % 96 %     2 Minute Post Liters of Oxygen 0 L           Oxygen Initial Assessment:  Oxygen Initial Assessment - 07/06/20 1604      Home Oxygen   Home Oxygen Device None    Sleep Oxygen Prescription CPAP    Home Exercise Oxygen Prescription None    Home Resting Oxygen Prescription None    Compliance with Home Oxygen Use No   issues with CPAP compliance due to congestion for last 2 months     Initial 6 min Walk   Oxygen Used None      Program Oxygen Prescription   Program Oxygen Prescription None      Intervention   Short Term Goals To learn and understand importance of monitoring SPO2 with pulse oximeter and demonstrate accurate use of the pulse oximeter.;To learn and understand importance of maintaining oxygen saturations>88%;To learn and demonstrate proper pursed lip breathing techniques or other breathing techniques.;To learn and demonstrate proper use of respiratory medications    Long  Term Goals Verbalizes importance of monitoring SPO2 with pulse oximeter and return demonstration;Compliance with respiratory medication;Exhibits proper breathing techniques, such as pursed lip breathing or other method taught during program session;Maintenance of O2 saturations>88%           Oxygen Re-Evaluation:  Oxygen Re-Evaluation    Row Name 07/21/20 1125 08/11/20 1113 09/10/20 1102         Program Oxygen Prescription   Program Oxygen Prescription None None None           Home Oxygen   Home Oxygen Device None None None     Sleep Oxygen Prescription CPAP CPAP CPAP     Liters per minute 0 -- --     Home Exercise Oxygen Prescription None None None     Home Resting Oxygen Prescription None None None     Compliance with Home Oxygen Use Yes Yes Yes           Goals/Expected Outcomes   Short Term Goals To learn and demonstrate proper pursed lip  breathing techniques or other breathing techniques. To learn and demonstrate proper pursed lip breathing techniques or other breathing techniques.;To learn and understand importance of maintaining oxygen saturations>88%;To learn and understand importance of monitoring SPO2 with pulse oximeter and demonstrate accurate use of the pulse oximeter. To learn and understand importance of maintaining oxygen saturations>88%     Long  Term Goals -- Verbalizes importance of monitoring SPO2 with pulse oximeter and return demonstration;Maintenance of O2 saturations>88%;Exhibits proper breathing techniques, such as pursed lip breathing or other method taught during program session;Compliance with respiratory medication Maintenance of O2 saturations>88%  Comments Reviewed PLB technique with pt.  Talked about how it works and it's importance in maintaining their exercise saturations. Staci is going to purchase a pulse oximeter to check her oxygen at home. Informed her the importance of having a pulse oximeter and keeping her oxygen above 88 percent. She does not have a pulse oximeter to check her oxygen saturation at home. Informed her where to get one and explained why it is important to have one. Reviewed that oxygen saturations should be 88 percent and above. Patient has a pulse oximeter at home to check his oxygen.     Goals/Expected Outcomes Short: Become more profiecient at using PLB.   Long: Become independent at using PLB. Short: purchase a pulse oximeter. Long: maintain oxygen saturations of 88 percent and above independently Short: monitor oxygen at home with exertion. Long: maintain oxygen saturations above 88 percent independently.            Oxygen Discharge (Final Oxygen Re-Evaluation):  Oxygen Re-Evaluation - 09/10/20 1102      Program Oxygen Prescription   Program Oxygen Prescription None      Home Oxygen   Home Oxygen Device None    Sleep Oxygen Prescription CPAP    Home Exercise Oxygen  Prescription None    Home Resting Oxygen Prescription None    Compliance with Home Oxygen Use Yes      Goals/Expected Outcomes   Short Term Goals To learn and understand importance of maintaining oxygen saturations>88%    Long  Term Goals Maintenance of O2 saturations>88%    Comments She does not have a pulse oximeter to check her oxygen saturation at home. Informed her where to get one and explained why it is important to have one. Reviewed that oxygen saturations should be 88 percent and above. Patient has a pulse oximeter at home to check his oxygen.    Goals/Expected Outcomes Short: monitor oxygen at home with exertion. Long: maintain oxygen saturations above 88 percent independently.           Initial Exercise Prescription:  Initial Exercise Prescription - 07/14/20 1400      Date of Initial Exercise RX and Referring Provider   Date 07/14/20    Referring Provider [name]      Treadmill   MPH 1.4    Grade 0    Minutes 15    METs 2      Recumbant Bike   Level 1    RPM 60    Minutes 15    METs 2      NuStep   Level 1    SPM 80    Minutes 15    METs 2      Recumbant Elliptical   Level 1    RPM 50    Minutes 15    METs 2      REL-XR   Level 1    Speed 50    Minutes 15    METs 2      Prescription Details   Frequency (times per week) 3    Duration Progress to 30 minutes of continuous aerobic without signs/symptoms of physical distress      Intensity   THRR 40-80% of Max Heartrate 103-132    Ratings of Perceived Exertion 11-13    Perceived Dyspnea 0-4      Progression   Progression Continue to progress workloads to maintain intensity without signs/symptoms of physical distress.      Resistance Training   Training Prescription Yes  Weight 2 lb    Reps 10-15           Perform Capillary Blood Glucose checks as needed.  Exercise Prescription Changes:  Exercise Prescription Changes    Row Name 07/14/20 1400 07/29/20 1000 08/11/20 1400 08/25/20 1500  09/09/20 1300     Response to Exercise   Blood Pressure (Admit) 120/60 110/60 118/70 116/78 114/58   Blood Pressure (Exercise) 134/74 122/60 124/60 124/68 128/64   Blood Pressure (Exit) 108/64 108/62 118/64 110/50 110/52   Heart Rate (Admit) 74 bpm 69 bpm 72 bpm 67 bpm 67 bpm   Heart Rate (Exercise) 92 bpm 75 bpm 78 bpm 80 bpm 78 bpm   Heart Rate (Exit) 83 bpm 64 bpm 69 bpm 66 bpm 69 bpm   Oxygen Saturation (Admit) 94 % 95 % 95 % 98 % 98 %   Oxygen Saturation (Exercise) 89 % 87 % 93 % 97 % 94 %   Oxygen Saturation (Exit) 96 % 93 % 94 % 92 % 98 %   Rating of Perceived Exertion (Exercise) _0 --   Perceived Dyspnea (Exercise) 1 1 0 4 1   Symptoms none SOB -- SOB on treadmill --   Comments -- second full day of exercise -- -- --   Duration -- Continue with 30 min of aerobic exercise without signs/symptoms of physical distress. Continue with 30 min of aerobic exercise without signs/symptoms of physical distress. Continue with 30 min of aerobic exercise without signs/symptoms of physical distress. Continue with 30 min of aerobic exercise without signs/symptoms of physical distress.   Intensity -- THRR unchanged THRR unchanged THRR unchanged THRR unchanged     Progression   Progression -- Continue to progress workloads to maintain intensity without signs/symptoms of physical distress. Continue to progress workloads to maintain intensity without signs/symptoms of physical distress. Continue to progress workloads to maintain intensity without signs/symptoms of physical distress. Continue to progress workloads to maintain intensity without signs/symptoms of physical distress.   Average METs -- 1.86 2.47 2.2 2.1     Resistance Training   Training Prescription -- Yes Yes Yes Yes   Weight -- 2 lb 2 lb 2 lb 2 lb   Reps -- 10-15 10-15 10-15 10-15     Interval Training   Interval Training -- No No No --     Treadmill   MPH -- 1.4 -- 1.4 1.4   Grade -- 0 -- 0 0   Minutes -- 15 -- 15 15    METs -- 2.07 -- 2.07 2.07     Recumbant Bike   Level -- -- 1 1 --   RPM -- -- 60 -- --   Watts -- -- -- 15 --   Minutes -- -- 15 15 --   METs -- -- 3.04 2.54 --     NuStep   Level -- -- _1 Minutes -- -- _2 METs -- -- 1.9 1.9 1.8     Recumbant Elliptical   Level -- 1 -- -- --   Minutes -- 15 -- -- --   METs -- 1.1 -- -- --     REL-XR   Level -- 1 -- -- --   Minutes -- 30 -- -- --   METs -- 2.4 -- -- --     Home Exercise Plan   Plans to continue exercise at -- -- -- Home (comment)  walking, elliptical Home (comment)  walking, elliptical  Frequency -- -- -- Add 2 additional days to program exercise sessions. Add 2 additional days to program exercise sessions.   Initial Home Exercises Provided -- -- -- 08/18/20 08/18/20          Exercise Comments:  Exercise Comments    Row Name 07/21/20 1123           Exercise Comments First full day of exercise!  Patient was oriented to gym and equipment including functions, settings, policies, and procedures.  Patient's individual exercise prescription and treatment plan were reviewed.  All starting workloads were established based on the results of the 6 minute walk test done at initial orientation visit.  The plan for exercise progression was also introduced and progression will be customized based on patient's performance and goals.              Exercise Goals and Review:  Exercise Goals    Row Name 07/14/20 1447             Exercise Goals   Increase Physical Activity Yes       Intervention Provide advice, education, support and counseling about physical activity/exercise needs.;Develop an individualized exercise prescription for aerobic and resistive training based on initial evaluation findings, risk stratification, comorbidities and participant's personal goals.       Expected Outcomes Short Term: Attend rehab on a regular basis to increase amount of physical activity.;Long Term: Add in home exercise to make  exercise part of routine and to increase amount of physical activity.;Long Term: Exercising regularly at least 3-5 days a week.       Increase Strength and Stamina Yes       Intervention Provide advice, education, support and counseling about physical activity/exercise needs.;Develop an individualized exercise prescription for aerobic and resistive training based on initial evaluation findings, risk stratification, comorbidities and participant's personal goals.       Expected Outcomes Short Term: Increase workloads from initial exercise prescription for resistance, speed, and METs.;Short Term: Perform resistance training exercises routinely during rehab and add in resistance training at home;Long Term: Improve cardiorespiratory fitness, muscular endurance and strength as measured by increased METs and functional capacity (6MWT)       Able to understand and use rate of perceived exertion (RPE) scale Yes       Intervention Provide education and explanation on how to use RPE scale       Expected Outcomes Short Term: Able to use RPE daily in rehab to express subjective intensity level;Long Term:  Able to use RPE to guide intensity level when exercising independently       Able to understand and use Dyspnea scale Yes       Intervention Provide education and explanation on how to use Dyspnea scale       Expected Outcomes Short Term: Able to use Dyspnea scale daily in rehab to express subjective sense of shortness of breath during exertion;Long Term: Able to use Dyspnea scale to guide intensity level when exercising independently       Knowledge and understanding of Target Heart Rate Range (THRR) Yes       Intervention Provide education and explanation of THRR including how the numbers were predicted and where they are located for reference       Expected Outcomes Short Term: Able to state/look up THRR;Long Term: Able to use THRR to govern intensity when exercising independently;Short Term: Able to use daily  as guideline for intensity in rehab       Able  to check pulse independently Yes       Intervention Provide education and demonstration on how to check pulse in carotid and radial arteries.;Review the importance of being able to check your own pulse for safety during independent exercise       Expected Outcomes Short Term: Able to explain why pulse checking is important during independent exercise;Long Term: Able to check pulse independently and accurately       Understanding of Exercise Prescription Yes       Intervention Provide education, explanation, and written materials on patient's individual exercise prescription       Expected Outcomes Short Term: Able to explain program exercise prescription;Long Term: Able to explain home exercise prescription to exercise independently              Exercise Goals Re-Evaluation :  Exercise Goals Re-Evaluation    Row Name 07/21/20 1124 07/29/20 1004 08/11/20 1116 08/18/20 1134 08/25/20 1535     Exercise Goal Re-Evaluation   Exercise Goals Review Able to understand and use rate of perceived exertion (RPE) scale;Able to understand and use Dyspnea scale;Knowledge and understanding of Target Heart Rate Range (THRR);Understanding of Exercise Prescription Increase Physical Activity;Increase Strength and Stamina;Understanding of Exercise Prescription Increase Physical Activity;Increase Strength and Stamina;Understanding of Exercise Prescription Increase Physical Activity;Increase Strength and Stamina Increase Physical Activity;Increase Strength and Stamina;Understanding of Exercise Prescription   Comments Reviewed RPE and dyspnea scales, THR and program prescription with pt today.  Pt voiced understanding and was given a copy of goals to take home. Babette is off to a good start in rehab.  She can completed her first two full days of exercise thus far.  We will continue to montior her progress. Jisell uses a recumbant eliptical at home twice a week. She is going to  try to go more than 15  minutes on her machine at home. She still needs home exercise and is increases her levels in rehab. Pt able to follow exercise prescription today without complaint.  Will continue to monitor for progression.  Reviewed home exercise with pt today.  Pt plans to use elliptical and walk for exercise.  Reviewed THR, pulse, RPE, sign and symptoms, pulse oximetery and when to call 911 or MD.  Also discussed weather considerations and indoor options.  Pt voiced understanding. Joyceann is doing well in rehab.  She has missed her last two session due to GI issues and dental surgery.  She is up to 15 watts on the bike.  We will continue to montior her progress.   Expected Outcomes Short: Use RPE daily to regulate intensity. Long: Follow program prescription in [facility]. Short: Continue to attend rehab regularly Long: Continue to follow program prescription. Short: increase honme exercise. Long: maintain exercise at home independently Short: monitor HR and O2 when exercising at home Long: maintain exercise on her own Short: Improved attendance again Long: Continue to improve stamina   Row Name 09/09/20 1401             Exercise Goal Re-Evaluation   Exercise Goals Review Increase Physical Activity;Increase Strength and Stamina       Comments Sheral continues to attend consistently.  Oxygen levels stay in the mid 90s during exercise.  Staff will encourage increasing levels/adding incline.       Expected Outcomes Short: increase levels Long: increase MET level              Discharge Exercise Prescription (Final Exercise Prescription Changes):  Exercise Prescription Changes - 09/09/20 1300  Response to Exercise   Blood Pressure (Admit) 114/58    Blood Pressure (Exercise) 128/64    Blood Pressure (Exit) 110/52    Heart Rate (Admit) 67 bpm    Heart Rate (Exercise) 78 bpm    Heart Rate (Exit) 69 bpm    Oxygen Saturation (Admit) 98 %    Oxygen Saturation (Exercise) 94 %    Oxygen  Saturation (Exit) 98 %    Perceived Dyspnea (Exercise) 1    Duration Continue with 30 min of aerobic exercise without signs/symptoms of physical distress.    Intensity THRR unchanged      Progression   Progression Continue to progress workloads to maintain intensity without signs/symptoms of physical distress.    Average METs 2.1      Resistance Training   Training Prescription Yes    Weight 2 lb    Reps 10-15      Treadmill   MPH 1.4    Grade 0    Minutes 15    METs 2.07      NuStep   Level 3    Minutes 15    METs 1.8      Home Exercise Plan   Plans to continue exercise at Home (comment)   walking, elliptical   Frequency Add 2 additional days to program exercise sessions.    Initial Home Exercises Provided 08/18/20           Nutrition:  Target Goals: Understanding of nutrition guidelines, daily intake of sodium <1564m, cholesterol <2056m calories 30% from fat and 7% or less from saturated fats, daily to have 5 or more servings of fruits and vegetables.  Education: All About Nutrition: -Group instruction provided by verbal, written material, interactive activities, discussions, models, and posters to present general guidelines for heart healthy nutrition including fat, fiber, MyPlate, the role of sodium in heart healthy nutrition, utilization of the nutrition label, and utilization of this knowledge for meal planning. Follow up email sent as well. Written material given at graduation. Flowsheet [facility]  Date 08/13/20  [facility] Review Code 1- Verbalizes Understanding      Biometrics:  Pre Biometrics - 07/14/20 1450      Pre Biometrics   Height 5' 7.5" (1.715 m)    Weight 165 lb 6.4 oz (75 kg)    BMI (Calculated) 25.51    Single Leg Stand 4.65 seconds            Nutrition Therapy Plan and Nutrition Goals:  Nutrition Therapy & Goals - 08/05/20 1533      Nutrition Therapy    Diet Heart healthy, low Na, diabetes friendly    Drug/Food Interactions Statins/Certain Fruits    Protein (specify units) 60g    Fiber 25 grams    Whole Grain Foods 3 servings    Saturated Fats 12 max. grams    Fruits and Vegetables 8 servings/day    Sodium 1.5 grams      Personal Nutrition Goals   Nutrition Goal ST: practice counting carbohydrates LT: A1C back to 7    Comments A1C is 8. She is on insulin. B: whole wheat toast (some regular jelly) with tuKuwaitacon and egg - coffee with stevia L: potpie with madarin oranges today D: tossed salad with cheese and processed ham yesterday, today she will have fish (broiled) with sweet potato and slaw. Drinks: water, juice (lemonade and crangrape - 1/2 glass) - with lunch. AM 70,  now 128 before eating today - she is normally around ~120. Elantra reported being very confused with macronutrients as well as diabetes friendly eating. Discussed all macronutrients as well as diabetes friendly eating  - patient voiced understanding, will review at goal time and encouraged attending education. She is limiting her sugar sweetened beverages and is giving up soda for Malena Edman - will use juice as her emergency sugar instead.      Intervention Plan   Intervention Prescribe, educate and counsel regarding individualized specific dietary modifications aiming towards targeted core components such as weight, hypertension, lipid management, diabetes, heart failure and other comorbidities.;Nutrition handout(s) given to patient.    Expected Outcomes Short Term Goal: Understand basic principles of dietary content, such as calories, fat, sodium, cholesterol and nutrients.;Short Term Goal: A plan has been developed with personal nutrition goals set during dietitian appointment.;Long Term Goal: Adherence to prescribed nutrition plan.           Nutrition Assessments:  MEDIFICTS Score Key:  ?70 Need to make dietary changes   40-70 Heart Healthy Diet  ? 40 Therapeutic Level  Cholesterol Diet  Flowsheet Row Pulmonary Rehab from 07/14/2020 in [facility] Pul[facility]  Picture Your Plate Total Score on Admission 56     Picture Your Plate Scores:  <51 Unhealthy dietary pattern with much room for improvement.  41-50 Dietary pattern unlikely to meet recommendations for good health and room for improvement.  51-60 More healthful dietary pattern, with some room for improvement.   >60 Healthy dietary pattern, although there may be some specific behaviors that could be improved.   Nutrition Goals Re-Evaluation:  Nutrition Goals Re-Evaluation    [place] Name 08/18/20 1218 09/10/20 1107           Goals   Current Weight -- 165 lb (74.8 kg)      Nutrition Goal -- Keep blood sugar in check.      Comment Lyrick reports having problems with reflux right now and is taking tums to combat it. It occurs mostly at night and wakes her up - she eats dinner at 6pm and feels reflux at 12am. Today she had reflux when exercising at 11am - she ate oatmeal at 9am. Suggested that reflux can be cause by a number of things, however, this may be a hunger response - suggested having a small snack before rehab and after dinner to see if this helps at all. She reports she has not chnaged what she was eating - this could also be a stress response. Blanche states that her reflex has been 98 percent better. She has been taking medication to help her reflux. She is checking her sugars at home and has been having lower blood sugar checks.      Expected Outcome -- Short: continue to eat food that help keep Blood sugar in check. Long: maintain a heart healthy diet.             Nutrition Goals Discharge (Final Nutrition Goals Re-Evaluation):  Nutrition Goals Re-Evaluation - 09/10/20 1107      Goals   Current Weight 165 lb (74.8 kg)    Nutrition Goal Keep blood sugar in check.    Comment Diego states that her reflex has been 98 percent better. She has been taking medication to help her reflux.  She is checking her sugars at home and has been having lower blood sugar checks.    Expected Outcome Short: continue to eat food that help keep Blood sugar in check.  Long: maintain a heart healthy diet.           Psychosocial: Target Goals: Acknowledge presence or absence of significant depression and/or stress, maximize coping skills, provide positive support system. Participant is able to verbalize types and ability to use techniques and skills needed for reducing stress and depression.   Education: Stress, Anxiety, and Depression - Group verbal and visual presentation to define topics covered.  Reviews how body is impacted by stress, anxiety, and depression.  Also discusses healthy ways to reduce stress and to treat/manage anxiety and depression.  Written material given at graduation. Flowsheet Row Pulmonary Rehab from 09/17/2020 in [facility] and Pulmonary Rehab  Date 09/10/20  Educator [facility]  Instruction Review Code 1- [employer] Understanding      Education: Sleep Hygiene -Provides group verbal and written instruction about how sleep can affect your health.  Define sleep hygiene, discuss sleep cycles and impact of sleep habits. Review good sleep hygiene tips.    Initial Review & Psychosocial Screening:  Initial Psych Review & Screening - 07/06/20 1539      Initial Review   Current issues with Current Sleep Concerns;Current Depression;History of Depression      Family Dynamics   Good Support System? Yes   daughter     Barriers   Psychosocial barriers to participate in program The patient should benefit from training in stress management and relaxation.      Screening Interventions   Interventions Encouraged to exercise;Provide feedback about the scores to participant;To provide support and resources with identified psychosocial needs    Expected Outcomes Short Term goal: Utilizing psychosocial counselor, staff and physician to assist with identification of specific Stressors  or current issues interfering with healing process. Setting desired goal for each stressor or current issue identified.;Long Term Goal: Stressors or current issues are controlled or eliminated.;Short Term goal: Identification and review with participant of any Quality of Life or Depression concerns found by scoring the questionnaire.;Long Term goal: The participant improves quality of Life and PHQ9 Scores as seen by post scores and/or verbalization of changes           Quality of Life Scores:  Scores of 19 and below usually indicate a poorer quality of life in these areas.  A difference of  2-3 points is a clinically meaningful difference.  A difference of 2-3 points in the total score of the Quality of Life Index has been associated with significant improvement in overall quality of life, self-image, physical symptoms, and general health in studies assessing change in quality of life.  PHQ-9: Recent Review Flowsheet Data    Depression screen [facility] 2/9 08/11/2020 07/14/2020 05/01/2017 02/27/2017 11/29/2016   Decreased Interest _0 Down, Depressed, Hopeless 0 _1 PHQ - 2 Score _2 Altered sleeping 2 0 0 0 3   Tired, decreased energy _3 Change in appetite 0 0 _4 Feeling bad or failure about yourself  0 0 _5 Trouble concentrating 0 0 _6 Moving slowly or fidgety/restless 0 0 0 0 0   Suicidal thoughts 0 0 0 2  2   PHQ-9 Score _7 Difficult doing work/chores Not difficult at all Not difficult at all Not difficult at all Somewhat difficult -     Interpretation of Total Score  Total Score  Depression Severity:  1-4 = Minimal depression, 5-9 = Mild depression, 10-14 = Moderate depression, 15-19 = Moderately severe depression, 20-27 = Severe depression   Psychosocial Evaluation and Intervention:  Psychosocial Evaluation - 07/06/20 1556      Psychosocial Evaluation & Interventions   Interventions Stress management education;Relaxation  education;Encouraged to exercise with the program and follow exercise prescription    Comments Anaaya is returning to Pulmonary Rehab with pulmonary HTN. She states she doesn't have stress because she "gets up and lives each day" she also stated that her feelings on depression are "who cares? we get up and do the same thing me and my husband so what difference does it make on my quality of life." She doesn't want to take any more medication even if it does help with mood. When she was in the program before, the counselor encouraged her to seek help from a therapist for help with depression but she said she could never get a hold of someone. She is seeing her primary doctor next week and said she would mention it to them. Her daughter and son are local and help with whatever she needs. COVID has prevented her from being able to exercise and get out of the house, so she thinks coming to pulmonary rehab will help get her back into a routine even though she is really only coming to please her daughter and doctor.    Expected Outcomes Short: attend pulmonary rehab for education and exercise. Long: develop positive self care habits.    Continue Psychosocial Services  Follow up required by staff           Psychosocial Re-Evaluation:  Psychosocial Re-Evaluation    [place] Name 08/11/20 1123 09/10/20 1103           Psychosocial Re-Evaluation   Current issues with History of Depression;Current Sleep Concerns Current Sleep Concerns;Current Stress Concerns      Comments Reviewed patient health questionnaire (PHQ-9) with patient for follow up. Previously, patients score indicated signs/symptoms of depression.  Reviewed to see if patient is improving symptom wise while in program.  Score declined and patient states that it is because she has little energy. Sometimes she has trouble sleeping. Her husbands sister has passed away and her Nephew took it really hard. She had alot of feels and nervousness over her nephew  taking it so hard. She felt like she only had three hours of sleep despite taking melatonin. She is going to speak with her doctor about being tired and not sleeping well. She thinks maybe she needs diferent medications.      Expected Outcomes Short: Continue to work toward an improvement in [place] scores by attending [facility] regularly. Long: Continue to improve stress and depression coping skills by talking with staff and attending [facility] regularly and work toward a positive mental state. Short: go to the doctor to talk about medications. Long: get better sleep.      Interventions Encouraged to attend Pulmonary Rehabilitation for the exercise;Relaxation education Encouraged to attend Pulmonary Rehabilitation for the exercise      Continue Psychosocial Services  Follow up required by staff Follow up required by staff             Psychosocial Discharge (Final Psychosocial Re-Evaluation):  Psychosocial Re-Evaluation - 09/10/20 1103      Psychosocial Re-Evaluation   Current issues with Current Sleep Concerns;Current Stress Concerns    Comments Her husbands sister has passed away and her Nephew took it really hard. She had  alot of feels and nervousness over her nephew taking it so hard. She felt like she only had three hours of sleep despite taking melatonin. She is going to speak with her doctor about being tired and not sleeping well. She thinks maybe she needs diferent medications.    Expected Outcomes Short: go to the doctor to talk about medications. Long: get better sleep.    Interventions Encouraged to attend Pulmonary Rehabilitation for the exercise    Continue Psychosocial Services  Follow up required by staff           Education: Education Goals: Education classes will be provided on a weekly basis, covering required topics. Participant will state understanding/return demonstration of topics presented.  Learning Barriers/Preferences:  Learning Barriers/Preferences - 07/06/20 1554       Learning Barriers/Preferences   Learning Barriers None    Learning Preferences None           General Pulmonary Education Topics:  Infection Prevention: - Provides verbal and written material to individual with discussion of infection control including proper hand washing and proper equipment cleaning during exercise session. Flowsheet Row Pulmonary Rehab from 09/17/2020 in [facility] and Pulmonary Rehab  Date 07/14/20  Educator AS  Instruction Review Code 1- Verbalizes Understanding      Falls Prevention: - Provides verbal and written material to individual with discussion of falls prevention and safety. Flowsheet Row Pulmonary Rehab from 09/17/2020 in [facility] Card[facility] and Pulmonary Rehab  Date 07/14/20  Educator AS  Instruction Review Code 1- Verbalizes Understanding      Chronic Lung Disease Review: - Group verbal instruction with posters, models, PowerPoint presentations and videos,  to review new updates, new respiratory medications, new advancements in procedures and treatments. Providing information on websites and "800" numbers for continued self-education. Includes information about supplement oxygen, available portable oxygen systems, continuous and intermittent flow rates, oxygen safety, concentrators, and Medicare reimbursement for oxygen. Explanation of Pulmonary Drugs, including class, frequency, complications, importance of spacers, rinsing mouth after steroid MDI's, and proper cleaning methods for nebulizers. Review of basic lung anatomy and physiology related to function, structure, and complications of lung disease. Review of risk factors. Discussion about methods for diagnosing sleep apnea and types of masks and machines for OSA. Includes a review of the use of types of environmental controls: home humidity, furnaces, filters, dust mite/pet prevention, HEPA vacuums. Discussion about weather changes, air quality and the benefits of nasal washing. Instruction on  Warning signs, infection symptoms, calling MD promptly, preventive modes, and value of vaccinations. Review of effective airway clearance, coughing and/or vibration techniques. Emphasizing that all should Create an Action Plan. Written material given at graduation. Flowsheet Row Pulmonary Rehab from 09/17/2020 in [facility] Cardiac and Pulmonary Rehab  Date 09/03/20  Educator [facility]  Instruction Review Code 1- Verbalizes Understanding      AED/CPR: - Group verbal and written instruction with the use of models to demonstrate the basic use of the AED with the basic ABC's of resuscitation.    Anatomy and Cardiac Procedures: - Group verbal and visual presentation and models provide information about basic cardiac anatomy and function. Reviews the testing methods done to diagnose heart disease and the outcomes of the test results. Describes the treatment choices: Medical Management, Angioplasty, or Coronary Bypass Surgery for treating various heart conditions including Myocardial Infarction, Angina, Valve Disease, and Cardiac Arrhythmias.  Written material given at graduation. Flowsheet Row Pulmonary Rehab from 09/17/2020 in [facility] Card[facility] and Pulmonary Rehab  Date 07/30/20  Educator [facility]  Instruction Review Code 1- Verbalizes Understanding      Medication Safety: - Group verbal and visual instruction to review commonly prescribed medications for heart and lung disease. Reviews the medication, class of the drug, and side effects. Includes the steps to properly store meds and maintain the prescription regimen.  Written material given at graduation. Flowsheet Row Pulmonary Rehab from 06/23/2017 in [facility] Cardiac and Pulmonary Rehab  Date 05/12/17  Educator [place]  Instruction Review Code 1- Verbalizes Understanding      Other: -Provides group and verbal instruction on various topics (see comments)   Knowledge Questionnaire Score:  Knowledge Questionnaire Score - 07/14/20 1439      Knowledge Questionnaire  Score   Pre Score 13/18  oxygen            Core Components/Risk Factors/Patient Goals at Admission:  Personal Goals and Risk Factors at Admission - 07/14/20 1445      Core Components/Risk Factors/Patient Goals on Admission    Weight Management Weight Maintenance;Yes    Intervention Weight Management: Develop a combined nutrition and exercise program designed to reach desired caloric intake, while maintaining appropriate intake of nutrient and fiber, sodium and fats, and appropriate energy expenditure required for the weight goal.;Weight Management: Provide education and appropriate resources to help participant work on and attain dietary goals.;Weight Management/Obesity: Establish reasonable short term and long term weight goals.    Admit Weight 165 lb 6.4 oz (75 kg)    Goal Weight: Short Term 160 lb (72.6 kg)    Goal Weight: Long Term 160 lb (72.6 kg)    Expected Outcomes Short Term: Continue to assess and modify interventions until short term weight is achieved;Long Term: Adherence to nutrition and physical activity/exercise program aimed toward attainment of established weight goal;Weight Maintenance: Understanding of the daily nutrition guidelines, which includes 25-35% calories from fat, 7% or less cal from saturated fats, less than 223m cholesterol, less than 1.5gm of sodium, & 5 or more servings of fruits and vegetables daily;Understanding recommendations for meals to include 15-35% energy as protein, 25-35% energy from fat, 35-60% energy from carbohydrates, less than 2085mof dietary cholesterol, 20-35 gm of total fiber daily;Understanding of distribution of calorie intake throughout the day with the consumption of 4-5 meals/snacks    Improve shortness of breath with ADL's Yes    Intervention Provide education, individualized exercise plan and daily activity instruction to help decrease symptoms of SOB with activities of daily living.    Expected Outcomes Short Term: Improve  cardiorespiratory fitness to achieve a reduction of symptoms when performing ADLs;Long Term: Be able to perform more ADLs without symptoms or delay the onset of symptoms    Diabetes Yes    Intervention Provide education about signs/symptoms and action to take for hypo/hyperglycemia.;Provide education about proper nutrition, including hydration, and aerobic/resistive exercise prescription along with prescribed medications to achieve blood glucose in normal ranges: Fasting glucose 65-99 mg/dL    Expected Outcomes Short Term: Participant verbalizes understanding of the signs/symptoms and immediate care of hyper/hypoglycemia, proper foot care and importance of medication, aerobic/resistive exercise and nutrition plan for blood glucose control.;Long Term: Attainment of HbA1C < 7%.    Heart Failure Yes    Intervention Provide a combined exercise and nutrition program that is supplemented with education, support and counseling about heart failure. Directed toward relieving symptoms such as shortness of breath, decreased exercise tolerance, and extremity edema.    Expected Outcomes Improve functional capacity of life;Short term: Attendance in program 2-3 days a week with  increased exercise capacity. Reported lower sodium intake. Reported increased fruit and vegetable intake. Reports medication compliance.;Short term: Daily weights obtained and reported for increase. Utilizing diuretic protocols set by physician.;Long term: Adoption of self-care skills and reduction of barriers for early signs and symptoms recognition and intervention leading to self-care maintenance.    Hypertension Yes    Intervention Provide education on lifestyle modifcations including regular physical activity/exercise, weight management, moderate sodium restriction and increased consumption of fresh fruit, vegetables, and low fat dairy, alcohol moderation, and smoking cessation.;Monitor prescription use compliance.    Expected Outcomes Short  Term: Continued assessment and intervention until BP is < 140/100m HG in hypertensive participants. < 130/871mHG in hypertensive participants with diabetes, heart failure or chronic kidney disease.;Long Term: Maintenance of blood pressure at goal levels.    Lipids Yes    Intervention Provide education and support for participant on nutrition & aerobic/resistive exercise along with prescribed medications to achieve LDL <7022mHDL >86m70m  Expected Outcomes Short Term: Participant states understanding of desired cholesterol values and is compliant with medications prescribed. Participant is following exercise prescription and nutrition guidelines.;Long Term: Cholesterol controlled with medications as prescribed, with individualized exercise RX and with personalized nutrition plan. Value goals: LDL < 70mg66mL > 40 mg.           Education:Diabetes - Individual verbal and written instruction to review signs/symptoms of diabetes, desired ranges of glucose level fasting, after meals and with exercise. Acknowledge that pre and post exercise glucose checks will be done for 3 sessions at entry of program. Flow[facility]  Date 07/06/20  Educator [facility] Code 1- Verbalizes Understanding      Know Your Numbers and Heart Failure: - Group verbal and visual instruction to discuss disease risk factors for cardiac and pulmonary disease and treatment options.  Reviews associated critical values for Overweight/Obesity, Hypertension, Cholesterol, and Diabetes.  Discusses basics of heart failure: signs/symptoms and treatments.  Introduces Heart Failure Zone chart for action plan for heart failure.  Written material given at graduation. Flowsheet [facility]  Date 08/27/20  Educator [facility]  [facility] Review Code 1- Verbalizes Understanding      Core Components/Risk  Factors/Patient Goals Review:   Goals and Risk Factor Review    Row Name 08/11/20 1119 09/10/20 1109           Core Components/Risk Factors/Patient Goals Review   Personal Goals Review Improve shortness of breath with ADL's Weight Management/Obesity;Diabetes      Review Spoke to patient about their shortness of breath and what they can do to improve. Patient has been informed of breathing techniques when starting the program. Patient is informed to tell staff if they have had any med changes and that certain meds they are taking or not taking can be causing shortness of breath. PeggyJudiethoing well in maintaining her weight. She is checking her sugars everyday and is keeping her sugar under control. She is 165 pounds today and wants to maintain.      Expected Outcomes Short: Attend [facility] regularly to improve shortness of breath with ADL's. Long: maintain independence with ADL's Short: continue to maintain weight. Long: maintain weight independently after [facility].             Core Components/Risk Factors/Patient Goals at Discharge (Final Review):   Goals and Risk Factor Review - 09/10/20 1109  Core Components/Risk Factors/Patient Goals Review   Personal Goals Review Weight Management/Obesity;Diabetes    Review Najmah is doing well in maintaining her weight. She is checking her sugars everyday and is keeping her sugar under control. She is 165 pounds today and wants to maintain.    Expected Outcomes Short: continue to maintain weight. Long: maintain weight independently after [facility].           ITP Comments:  ITP Comments    Row Name 07/06/20 1536 07/14/20 1452 07/21/20 1123 07/29/20 0614 08/05/20 1532   ITP Comments Initial telephone orientation completed. Diagnosis can be found in 11/12. EP orientation scheduled for 2/8 at 1pm Completed 6MWT and gym orientation. Initial ITP created and sent for review to Dr. Emily Filbert, Medical Director. First full day of exercise!  Patient  was oriented to gym and equipment including functions, settings, policies, and procedures.  Patient's individual exercise prescription and treatment plan were reviewed.  All starting workloads were established based on the results of the 6 minute walk test done at initial orientation visit.  The plan for exercise progression was also introduced and progression will be customized based on patient's performance and goals. 30 Day review completed. Medical Director ITP review done, changes made as directed, and signed approval by Medical Director. Completed Initial RD evaluation   Row Name 08/26/20 [phone] 09/23/20 0933         ITP Comments 30 Day review completed. Medical Director ITP review done, changes made as directed, and signed approval by Medical Director. 30 Day review completed. Medical Director ITP review done, changes made as directed, and signed approval by Medical Director.             Comments:

## 2020-09-24 ENCOUNTER — Other Ambulatory Visit: Payer: Self-pay

## 2020-09-24 DIAGNOSIS — I272 Pulmonary hypertension, unspecified: Secondary | ICD-10-CM | POA: Diagnosis not present

## 2020-09-24 NOTE — Progress Notes (Signed)
Daily Session Note  Patient Details  Name: Jamie Murray MRN: [id number] Date of Birth: [birth year] Referring Provider:   [name] Pulmonary Rehab from 07/14/2020 in [facility] Re[facility]  Referring Provider [name]      Encounter Date: 09/24/2020  Check In:  Session Check In - 09/24/20 1031      Check-In   Supervising physician immediately available to respond to emergencies See telemetry face sheet for immediately available ER MD    Location [facility] & Pul[facility] Re[facility]    Staff Present Birdie Sons, MPA, RN;Melissa Caiola RDN, Rowe Pavy, BA, ACSM CEP, Exercise Physiologist;Meredith Sherryll Burger, RN BSN    Virtual Visit No    Medication changes reported     No    Fall or balance concerns reported    No    Warm-up and Cool-down Performed on first and last piece of equipment    Resistance Training Performed Yes    VAD Patient? No    PAD/SET Patient? No      Pain Assessment   Currently in Pain? No/denies              Social History   Tobacco Use  Smoking Status Never Smoker  Smokeless Tobacco Never Used    Goals Met:  Independence with exercise equipment Exercise tolerated well No report of cardiac concerns or symptoms Strength training completed today  Goals Unmet:  Not Applicable  Comments: Pt able to follow exercise prescription today without complaint.  Will continue to monitor for progression.    Dr. Emily Filbert is Medical Director for [place] and [facility].

## 2020-09-29 ENCOUNTER — Other Ambulatory Visit: Payer: Self-pay

## 2020-09-29 DIAGNOSIS — I272 Pulmonary hypertension, unspecified: Secondary | ICD-10-CM | POA: Diagnosis not present

## 2020-09-29 NOTE — Progress Notes (Signed)
Daily Session Note  Patient Details  Name: Jamie Murray MRN: [id number] Date of Birth: 04/09/1946 Referring Provider:   Flowsheet Row Pulmonary Rehab from 07/14/2020 in [facility]  Referring Provider [place]      Encounter Date: 09/29/2020  Check In:  Session Check In - 09/29/20 1125      Check-In   Supervising physician immediately available to respond to emergencies See telemetry face sheet for immediately available ER MD    Location [facility]    Staff Present Kelly Bollinger, MPA, RN;Amanda Sommer, BA, ACSM CEP, Exercise Physiologist;Melissa Caiola RDN, LDN;Joseph Hood RCP,RRT,BSRT    Virtual Visit No    Medication changes reported     No    Fall or balance concerns reported    No    Warm-up and Cool-down Performed on first and last piece of equipment    Resistance Training Performed Yes    VAD Patient? No    PAD/SET Patient? No      Pain Assessment   Currently in Pain? No/denies              Social History   Tobacco Use  Smoking Status Never Smoker  Smokeless Tobacco Never Used    Goals Met:  Independence with exercise equipment Exercise tolerated well No report of cardiac concerns or symptoms Strength training completed today  Goals Unmet:  Not Applicable  Comments: Pt able to follow exercise prescription today without complaint.  Will continue to monitor for progression.    Dr. Mark Miller is Medical Director for [facility] and [facility]. 

## 2020-10-02 ENCOUNTER — Other Ambulatory Visit (HOSPITAL_COMMUNITY): Payer: Self-pay

## 2020-10-02 MED ORDER — AMLODIPINE BESYLATE 10 MG PO TABS
10.0000 mg | ORAL_TABLET | Freq: Every day | ORAL | 0 refills | Status: DC
Start: 2020-10-02 — End: 2021-02-24

## 2020-10-06 ENCOUNTER — Other Ambulatory Visit: Payer: Self-pay

## 2020-10-06 ENCOUNTER — Encounter: Payer: Medicare PPO | Attending: Cardiology

## 2020-10-06 DIAGNOSIS — I272 Pulmonary hypertension, unspecified: Secondary | ICD-10-CM | POA: Diagnosis present

## 2020-10-06 NOTE — Progress Notes (Signed)
Daily Session Note  Patient Details  Name: Jamie Murray MRN: [id number] Date of Birth: 03-Jul-1945 Referring Provider:   [name] Pulmonary Rehab from 07/14/2020 in [facility] and Pulmonary Rehab  Referring Provider [name]      Encounter Date: 10/06/2020  Check In:  Session Check In - 10/06/20 1133      Check-In   Supervising physician immediately available to respond to emergencies See telemetry face sheet for immediately available ER MD    Location [facility]    Staff Present Birdie Sons, MPA, Elveria Rising, BA, ACSM CEP, Exercise Physiologist;Joseph Hood RCP,RRT,BSRT;Melissa Caiola RDN, LDN    Virtual Visit No    Medication changes reported     No    Fall or balance concerns reported    No    Warm-up and Cool-down Performed on first and last piece of equipment    Resistance Training Performed Yes    VAD Patient? No    PAD/SET Patient? No      Pain Assessment   Currently in Pain? No/denies              Social History   Tobacco Use  Smoking Status Never Smoker  Smokeless Tobacco Never Used    Goals Met:  Independence with exercise equipment Exercise tolerated well Personal goals reviewed No report of cardiac concerns or symptoms Strength training completed today  Goals Unmet:  Not Applicable  Comments: Pt able to follow exercise prescription today without complaint.  Will continue to monitor for progression.    Dr. Emily Filbert is Medical Director for [place] and [facility].

## 2020-10-08 ENCOUNTER — Other Ambulatory Visit: Payer: Self-pay

## 2020-10-08 DIAGNOSIS — I272 Pulmonary hypertension, unspecified: Secondary | ICD-10-CM | POA: Diagnosis not present

## 2020-10-08 NOTE — Progress Notes (Signed)
Daily Session Note  Patient Details  Name: Quana Chamberlain MRN: [id number] Date of Birth: Dec 11, 1945 Referring Provider:   [name] Pulmonary Rehab from 07/14/2020 in [facility] Cardiac and Pulmonary Rehab  Referring Provider [name]      Encounter Date: 10/08/2020  Check In:  Session Check In - 10/08/20 1126      Check-In   Supervising physician immediately available to respond to emergencies See telemetry face sheet for immediately available ER MD    Location [facility]    Staff Present Birdie Sons, MPA, RN;Melissa Caiola RDN, LDN;Jessica Luan Pulling, MA, RCEP, CCRP, CCET    Virtual Visit No    Medication changes reported     No    Fall or balance concerns reported    No    Warm-up and Cool-down Performed on first and last piece of equipment    Resistance Training Performed Yes    VAD Patient? No    PAD/SET Patient? No      Pain Assessment   Currently in Pain? No/denies              Social History   Tobacco Use  Smoking Status Never Smoker  Smokeless Tobacco Never Used    Goals Met:  Independence with exercise equipment Exercise tolerated well Personal goals reviewed No report of cardiac concerns or symptoms Strength training completed today  Goals Unmet:  Not Applicable  Comments: Pt able to follow exercise prescription today without complaint.  Will continue to monitor for progression.    Dr. Emily Filbert is Medical Director for [place] and [facility].

## 2020-10-13 ENCOUNTER — Other Ambulatory Visit: Payer: Self-pay

## 2020-10-13 DIAGNOSIS — I272 Pulmonary hypertension, unspecified: Secondary | ICD-10-CM

## 2020-10-13 NOTE — Progress Notes (Signed)
Daily Session Note  Patient Details  Name: Jamie Murray MRN: [id number] Date of Birth: 1946-04-03 Referring Provider:   [name] Pulmonary Rehab from 07/14/2020 in [facility] Cardiac and Pulmonary Rehab  Referring Provider [name]      Encounter Date: 4/76/5465 Duplicate note

## 2020-10-13 NOTE — Progress Notes (Signed)
Daily Session Note  Patient Details  Name: Jamie Murray MRN: [id number] Date of Birth: 1946/05/04 Referring Provider:   [name] Pulmonary Rehab from 07/14/2020 in [facility] and Pulmonary Rehab  Referring Provider [name]      Encounter Date: 10/13/2020  Check In:  Session Check In - 10/13/20 1057      Check-In   Supervising physician immediately available to respond to emergencies See telemetry face sheet for immediately available ER MD    Location [facility]    Staff Present Birdie Sons, MPA, RN;Melissa Caiola RDN, Rowe Pavy, BA, ACSM CEP, Exercise Physiologist;Joseph Tessie Fass RCP,RRT,BSRT    Virtual Visit No    Medication changes reported     No    Fall or balance concerns reported    No    Warm-up and Cool-down Performed on first and last piece of equipment    Resistance Training Performed Yes    VAD Patient? No    PAD/SET Patient? No      Pain Assessment   Currently in Pain? No/denies              Social History   Tobacco Use  Smoking Status Never Smoker  Smokeless Tobacco Never Used    Goals Met:  Independence with exercise equipment Exercise tolerated well No report of cardiac concerns or symptoms Strength training completed today  Goals Unmet:  Not Applicable  Comments: Pt able to follow exercise prescription today without complaint.  Will continue to monitor for progression.     Dr. Emily Filbert is Medical Director for [place] and [facility].

## 2020-10-15 ENCOUNTER — Other Ambulatory Visit: Payer: Self-pay

## 2020-10-15 DIAGNOSIS — I272 Pulmonary hypertension, unspecified: Secondary | ICD-10-CM

## 2020-10-15 NOTE — Progress Notes (Signed)
Daily Session Note  Patient Details  Name: Jamie Murray MRN: [id number] Date of Birth: [birth year] Referring Provider:   April Murray Pulmonary Rehab from 07/14/2020 in [facility] Cardiac and Pulmonary Rehab  Referring Provider Jamie Murray      Encounter Date: 10/15/2020  Check In:  Session Check In - 10/15/20 1109      Check-In   Supervising physician immediately available to respond to emergencies See telemetry face sheet for immediately available ER MD    Location [facility]    Staff Present Jamie Murray, MPA, RN;Jamie Murray, [place], RCEP, CCRP, Jamie Murray, Jamie Murray, ACSM CEP, Exercise Physiologist    Virtual Visit No    Medication changes reported     No    Fall or balance concerns reported    No    Warm-up and Cool-down Performed on first and last piece of equipment    Resistance Training Performed Yes    VAD Patient? No    PAD/SET Patient? No      Pain Assessment   Currently in Pain? No/denies              Social History   Tobacco Use  Smoking Status Never Smoker  Smokeless Tobacco Never Used    Goals Met:  Independence with exercise equipment Exercise tolerated well No report of cardiac concerns or symptoms Strength training completed today  Goals Unmet:  Not Applicable  Comments: Pt able to follow exercise prescription today without complaint.  Will continue to monitor for progression.    Dr. Emily Murray is Medical Director for [place] and [facility].

## 2020-10-20 ENCOUNTER — Other Ambulatory Visit: Payer: Self-pay

## 2020-10-20 DIAGNOSIS — I272 Pulmonary hypertension, unspecified: Secondary | ICD-10-CM | POA: Diagnosis not present

## 2020-10-20 NOTE — Progress Notes (Signed)
Daily Session Note  Patient Details  Name: Jamie Murray MRN: [id number] Date of Birth: 1945-10-10 Referring Provider:   [name] Pulmonary Rehab from 07/14/2020 in [facility] Cardiac and Pulmonary Rehab  Referring Provider [name]      Encounter Date: 10/20/2020  Check In:  Session Check In - 10/20/20 1128      Check-In   Supervising physician immediately available to respond to emergencies See telemetry face sheet for immediately available ER MD    Location [facility]    Staff Present Birdie Sons, MPA, RN;Melissa Caiola RDN, LDN;Joseph Tessie Fass RCP,RRT,BSRT    Virtual Visit No    Medication changes reported     No    Fall or balance concerns reported    No    Warm-up and Cool-down Performed on first and last piece of equipment    Resistance Training Performed Yes    VAD Patient? No    PAD/SET Patient? No      Pain Assessment   Currently in Pain? No/denies              Social History   Tobacco Use  Smoking Status Never Smoker  Smokeless Tobacco Never Used    Goals Met:  Independence with exercise equipment Exercise tolerated well No report of cardiac concerns or symptoms Strength training completed today  Goals Unmet:  Not Applicable  Comments: Pt able to follow exercise prescription today without complaint.  Will continue to monitor for progression.    Dr. Emily Filbert is Medical Director for [place] and [facility].

## 2020-10-21 ENCOUNTER — Encounter: Payer: Self-pay | Admitting: *Deleted

## 2020-10-21 DIAGNOSIS — I272 Pulmonary hypertension, unspecified: Secondary | ICD-10-CM

## 2020-10-21 NOTE — Progress Notes (Signed)
Pulmonary Individual Treatment Plan  Patient Details  Name: Jamie Murray MRN: [id number] Date of Birth: 1946-03-26 Referring Provider:   [name] Pulmonary Rehab from 07/14/2020 in [facility] Cardiac and Pulmonary Rehab  Referring Provider [name]      Initial Encounter Date:  Flowsheet Row Pulmonary Rehab from 07/14/2020 in [facility] Cardiac and Pulmonary Rehab  Date 07/14/20      Visit Diagnosis: Pulmonary hypertension ([place])  Patient's Home Medications on Admission:  Current Outpatient Medications:  .  ambrisentan (LETAIRIS) 10 MG tablet, Take 1 tablet (10 mg total) by mouth daily., Disp: 30 tablet, Rfl: 11 .  amLODipine (NORVASC) 10 MG tablet, Take 1 tablet (10 mg total) by mouth daily., Disp: 90 tablet, Rfl: 0 .  aspirin 81 MG tablet, Take 81 mg by mouth daily., Disp: , Rfl:  .  clonazePAM (KLONOPIN) 1 MG tablet, Take 1 mg by mouth daily., Disp: , Rfl:  .  dapagliflozin propanediol (FARXIGA) 10 MG TABS tablet, Take 1 tablet (10 mg total) by mouth daily before breakfast., Disp: 90 tablet, Rfl: 3 .  escitalopram (LEXAPRO) 10 MG tablet, Take 10 mg by mouth daily., Disp: , Rfl:  .  esomeprazole (NEXIUM) 40 MG capsule, Take 40 mg by mouth daily at 12 noon., Disp: , Rfl:  .  Fe Fum-FA-B Cmp-C-Zn-Mg-Mn-Cu (HEMOCYTE PLUS) 106-1 MG CAPS, Take 1 capsule by mouth daily., Disp: , Rfl: 6 .  furosemide (LASIX) 20 MG tablet, Take 1 tablet (20 mg total) by mouth every other day., Disp: 30 tablet, Rfl: 6 .  hydrALAZINE (APRESOLINE) 50 MG tablet, Take 1 tablet (50 mg total) by mouth every 8 (eight) hours., Disp: 90 tablet, Rfl: 0 .  insulin aspart (NOVOLOG) 100 UNIT/ML FlexPen, Inject 4 Units into the skin 3 (three) times daily with meals. (Patient taking differently: Inject 4 Units into the skin 3 (three) times daily with meals. Only for BS greater than 200), Disp: 15 mL, Rfl: 0 .  insulin degludec (TRESIBA) 200 UNIT/ML FlexTouch Pen, Inject 26 Units into the skin at bedtime., Disp: , Rfl:  .   levothyroxine (SYNTHROID) 75 MCG tablet, Take 75 mcg by mouth daily before breakfast., Disp: , Rfl:  .  loratadine (CLARITIN) 10 MG tablet, Take 10 mg by mouth daily. , Disp: , Rfl:  .  meclizine (ANTIVERT) 25 MG tablet, Take 25 mg by mouth 3 (three) times daily as needed for dizziness. , Disp: , Rfl:  .  metoprolol succinate (TOPROL-XL) 50 MG 24 hr tablet, Take 50 mg by mouth daily. Take with or immediately following a meal., Disp: , Rfl:  .  Multiple Vitamins-Minerals (MULTI ADULT GUMMIES) CHEW, Chew by mouth daily., Disp: , Rfl:  .  potassium chloride (KLOR-CON) 10 MEQ tablet, Take 1 tablet (10 mEq total) by mouth every other day., Disp: 15 tablet, Rfl: 6 .  rosuvastatin (CRESTOR) 10 MG tablet, Take 10 mg by mouth daily., Disp: , Rfl:  .  Selexipag (UPTRAVI) 200 MCG TABS, Take 8 tablets (1,600 mcg total) by mouth in the morning and at bedtime. As directed by clinic, Disp: 60 tablet, Rfl: 11 .  tadalafil, PAH, (ADCIRCA) 20 MG tablet, Take 2 tablets (40 mg total) by mouth daily., Disp: 60 tablet, Rfl: 11  Past Medical History: Past Medical History:  Diagnosis Date  . Allergy   . Anemia   . Anxiety   . COPD (chronic obstructive pulmonary disease) ([place])   . Depression   . Diabetes ([place])   . GERD (gastroesophageal reflux disease)   .  Hyperlipidemia   . Hypertension   . Insomnia   . Migraines   . Osteoporosis   . [facility] spotted fever   . Transient cerebral ischemia   . Vertigo    every 2-3 months    Tobacco Use: Social History   Tobacco Use  Smoking Status Never Smoker  Smokeless Tobacco Never Used    Labs: Recent Review Flowsheet Data    Labs for ITP Cardiac and Pulmonary Rehab Latest Ref Rng & Units 03/07/2016 06/07/2016 11/29/2016 01/09/2020 07/03/2020   Cholestrol 0 - 200 mg/dL 145 213(H) 95(L) 128 121   LDLCALC 0 - 99 mg/dL 77 143(H) 45 68 55   HDL >40 mg/dL 43 37(L) 29(L) 24(L) 43   Trlycerides <150 mg/dL 123 164(H) 105 178(H) 115   Hemoglobin A1c 4.8 - 5.6 % 7.8(H)  8.5(H) 7.1(H) 10.9(H) -       Pulmonary Assessment Scores:  Pulmonary Assessment Scores    Row Name 07/14/20 1440         ADL UCSD   SOB Score total 33     Rest 3     Walk 3     Stairs 5     Bath 0     Dress 0     Shop 0           CAT Score   CAT Score 19           mMRC Score   mMRC Score 2            UCSD: Self-administered rating of dyspnea associated with activities of daily living (ADLs) 6-point scale (0 = "not at all" to 5 = "maximal or unable to do because of breathlessness")  Scoring Scores range from 0 to 120.  Minimally important difference is 5 units  CAT: CAT can identify the health impairment of COPD patients and is better correlated with disease progression.  CAT has a scoring range of zero to 40. The CAT score is classified into four groups of low (less than 10), medium (10 - 20), high (21-30) and very high (31-40) based on the impact level of disease on health status. A CAT score over 10 suggests significant symptoms.  A worsening CAT score could be explained by an exacerbation, poor medication adherence, poor inhaler technique, or progression of COPD or comorbid conditions.  CAT MCID is 2 points  mMRC: mMRC (Modified Medical Research Council) Dyspnea Scale is used to assess the degree of baseline functional disability in patients of respiratory disease due to dyspnea. No minimal important difference is established. A decrease in score of 1 point or greater is considered a positive change.   Pulmonary Function Assessment:   Exercise Target Goals: Exercise Program Goal: Individual exercise prescription set using results from initial 6 min walk test and THRR while considering  patient's activity barriers and safety.   Exercise Prescription Goal: Initial exercise prescription builds to 30-45 minutes a day of aerobic activity, 2-3 days per week.  Home exercise guidelines will be given to patient during program as part of exercise prescription that the  participant will acknowledge.  Education: Aerobic Exercise: - Group verbal and visual presentation on the components of exercise prescription. Introduces F.I.T.T principle from ACSM for exercise prescriptions.  Reviews F.I.T.T. principles of aerobic exercise including progression. Written material given at graduation. Flowsheet Row Pulmonary Rehab from 10/15/2020 in [facility] Cardiac and Pulmonary Rehab  Date 09/24/20  Educator [place]  Instruction Review Code 1- Verbalizes Understanding  Education: Resistance Exercise: - Group verbal and visual presentation on the components of exercise prescription. Introduces F.I.T.T principle from ACSM for exercise prescriptions  Reviews F.I.T.T. principles of resistance exercise including progression. Written material given at graduation. Flowsheet Row Pulmonary Rehab from 10/15/2020 in [facility] Cardiac and Pulmonary Rehab  Date 07/30/20  Educator [facility]  Instruction Review Code 1- Verbalizes Understanding       Education: Exercise & Equipment Safety: - Individual verbal instruction and demonstration of equipment use and safety with use of the equipment. Flowsheet Row Pulmonary Rehab from 10/15/2020 in [facility] Cardiac and Pulmonary Rehab  Date 07/14/20  Educator [facility]  Instruction Review Code 1- Verbalizes Understanding      Education: Exercise Physiology & General Exercise Guidelines: - Group verbal and written instruction with models to review the exercise physiology of the cardiovascular system and associated critical values. Provides general exercise guidelines with specific guidelines to those with heart or lung disease.  Flowsheet Row Pulmonary Rehab from 10/15/2020 in [facility] Cardiac and Pulmonary Rehab  Date 09/17/20  Educator [facility]  Instruction Review Code 1- Verbalizes Understanding      Education: Flexibility, Balance, Mind/Body Relaxation: - Group verbal and visual presentation with interactive activity on the components of exercise prescription. Introduces  F.I.T.T principle from ACSM for exercise prescriptions. Reviews F.I.T.T. principles of flexibility and balance exercise training including progression. Also discusses the mind body connection.  Reviews various relaxation techniques to help reduce and manage stress (i.e. Deep breathing, progressive muscle relaxation, and visualization). Balance handout provided to take home. Written material given at graduation. Flowsheet Row Pulmonary Rehab from 10/15/2020 in [facility] Cardiac and Pulmonary Rehab  Date 10/08/20  Educator AS  Instruction Review Code 1- Verbalizes Understanding      Activity Barriers & Risk Stratification:  Activity Barriers & Cardiac Risk Stratification - 07/06/20 1534      Activity Barriers & Cardiac Risk Stratification   Activity Barriers Back Problems;Muscular Weakness;Assistive Device   cane          6 Minute Walk:  6 Minute Walk    Row Name 07/14/20 1427         6 Minute Walk   Distance 780 feet     Walk Time 6 minutes     # of Rest Breaks 0     MPH 1.47     METS 1.9     RPE 11     Perceived Dyspnea  1     VO2 Peak 6.63     Symptoms No     Resting HR 74 bpm     Resting BP 120/60     Resting Oxygen Saturation  94 %     Exercise Oxygen Saturation  during 6 min walk 89 %     Max Ex. HR 92 bpm     Max Ex. BP 134/74     2 Minute Post BP 108/64           Interval HR   1 Minute HR 80     2 Minute HR 85     3 Minute HR 86     4 Minute HR 89     5 Minute HR 92     6 Minute HR 89     2 Minute Post HR 83     Interval Heart Rate? Yes           Interval Oxygen   Interval Oxygen? Yes     Baseline Oxygen Saturation % 94 %  1 Minute Oxygen Saturation % 98 %     1 Minute Liters of Oxygen 0 L     2 Minute Oxygen Saturation % 96 %     2 Minute Liters of Oxygen 0 L     3 Minute Oxygen Saturation % 93 %     3 Minute Liters of Oxygen 0 L     4 Minute Oxygen Saturation % 95 %     4 Minute Liters of Oxygen 0 L     5 Minute Oxygen Saturation % 89 %     5  Minute Liters of Oxygen 0 L     6 Minute Oxygen Saturation % 91 %     6 Minute Liters of Oxygen 0 L     2 Minute Post Oxygen Saturation % 96 %     2 Minute Post Liters of Oxygen 0 L           Oxygen Initial Assessment:  Oxygen Initial Assessment - 07/06/20 1604      Home Oxygen   Home Oxygen Device None    Sleep Oxygen Prescription CPAP    Home Exercise Oxygen Prescription None    Home Resting Oxygen Prescription None    Compliance with Home Oxygen Use No   issues with CPAP compliance due to congestion for last 2 months     Initial 6 min Walk   Oxygen Used None      Program Oxygen Prescription   Program Oxygen Prescription None      Intervention   Short Term Goals To learn and understand importance of monitoring SPO2 with pulse oximeter and demonstrate accurate use of the pulse oximeter.;To learn and understand importance of maintaining oxygen saturations>88%;To learn and demonstrate proper pursed lip breathing techniques or other breathing techniques.;To learn and demonstrate proper use of respiratory medications    Long  Term Goals Verbalizes importance of monitoring SPO2 with pulse oximeter and return demonstration;Compliance with respiratory medication;Exhibits proper breathing techniques, such as pursed lip breathing or other method taught during program session;Maintenance of O2 saturations>88%           Oxygen Re-Evaluation:  Oxygen Re-Evaluation    Row Name 07/21/20 1125 08/11/20 1113 09/10/20 1102 10/06/20 1130       Program Oxygen Prescription   Program Oxygen Prescription None None None None         Home Oxygen   Home Oxygen Device None None None None    Sleep Oxygen Prescription CPAP CPAP CPAP CPAP    Liters per minute 0 -- -- 0    Home Exercise Oxygen Prescription None None None None    Home Resting Oxygen Prescription None None None None    Compliance with Home Oxygen Use Yes Yes Yes Yes         Goals/Expected Outcomes   Short Term Goals To learn and  demonstrate proper pursed lip breathing techniques or other breathing techniques. To learn and demonstrate proper pursed lip breathing techniques or other breathing techniques.;To learn and understand importance of maintaining oxygen saturations>88%;To learn and understand importance of monitoring SPO2 with pulse oximeter and demonstrate accurate use of the pulse oximeter. To learn and understand importance of maintaining oxygen saturations>88% Other    Long  Term Goals -- Verbalizes importance of monitoring SPO2 with pulse oximeter and return demonstration;Maintenance of O2 saturations>88%;Exhibits proper breathing techniques, such as pursed lip breathing or other method taught during program session;Compliance with respiratory medication Maintenance of O2 saturations>88% Other  Comments Reviewed PLB technique with pt.  Talked about how it works and it's importance in maintaining their exercise saturations. Vickie is going to purchase a pulse oximeter to check her oxygen at home. Informed her the importance of having a pulse oximeter and keeping her oxygen above 88 percent. She does not have a pulse oximeter to check her oxygen saturation at home. Informed her where to get one and explained why it is important to have one. Reviewed that oxygen saturations should be 88 percent and above. Patient has a pulse oximeter at home to check his oxygen. Arthurine does not have any questions or concerns with her oxygen or shortness of breath. She states that she has not been getting short of breath as much. She has been informed of breathing techniques.    Goals/Expected Outcomes Short: Become more profiecient at using PLB.   Long: Become independent at using PLB. Short: purchase a pulse oximeter. Long: maintain oxygen saturations of 88 percent and above independently Short: monitor oxygen at home with exertion. Long: maintain oxygen saturations above 88 percent independently. Short: continue to use breathing techniques.  Long: maintain breathing techniques independently post [employer]           Oxygen Discharge (Final Oxygen Re-Evaluation):  Oxygen Re-Evaluation - 10/06/20 1130      Program Oxygen Prescription   Program Oxygen Prescription None      Home Oxygen   Home Oxygen Device None    Sleep Oxygen Prescription CPAP    Liters per minute 0    Home Exercise Oxygen Prescription None    Home Resting Oxygen Prescription None    Compliance with Home Oxygen Use Yes      Goals/Expected Outcomes   Short Term Goals Other    Long  Term Goals Other    Comments Kasara does not have any questions or concerns with her oxygen or shortness of breath. She states that she has not been getting short of breath as much. She has been informed of breathing techniques.    Goals/Expected Outcomes Short: continue to use breathing techniques. Long: maintain breathing techniques independently post [employer]           Initial Exercise Prescription:  Initial Exercise Prescription - 07/14/20 1400      Date of Initial Exercise RX and Referring Provider   Date 07/14/20    Referring Provider [name]      Treadmill   MPH 1.4    Grade 0    Minutes 15    METs 2      Recumbant Bike   Level 1    RPM 60    Minutes 15    METs 2      NuStep   Level 1    SPM 80    Minutes 15    METs 2      Recumbant Elliptical   Level 1    RPM 50    Minutes 15    METs 2      REL-XR   Level 1    Speed 50    Minutes 15    METs 2      Prescription Details   Frequency (times per week) 3    Duration Progress to 30 minutes of continuous aerobic without signs/symptoms of physical distress      Intensity   THRR 40-80% of Max Heartrate 103-132    Ratings of Perceived Exertion 11-13    Perceived Dyspnea 0-4      Progression  Progression Continue to progress workloads to maintain intensity without signs/symptoms of physical distress.      Resistance Training   Training Prescription Yes    Weight 2 lb    Reps 10-15            Perform Capillary Blood Glucose checks as needed.  Exercise Prescription Changes:  Exercise Prescription Changes    Row Name 07/14/20 1400 07/29/20 1000 08/11/20 1400 08/25/20 1500 09/09/20 1300     Response to Exercise   Blood Pressure (Admit) 120/60 110/60 118/70 116/78 114/58   Blood Pressure (Exercise) 134/74 122/60 124/60 124/68 128/64   Blood Pressure (Exit) 108/64 108/62 118/64 110/50 110/52   Heart Rate (Admit) 74 bpm 69 bpm 72 bpm 67 bpm 67 bpm   Heart Rate (Exercise) 92 bpm 75 bpm 78 bpm 80 bpm 78 bpm   Heart Rate (Exit) 83 bpm 64 bpm 69 bpm 66 bpm 69 bpm   Oxygen Saturation (Admit) 94 % 95 % 95 % 98 % 98 %   Oxygen Saturation (Exercise) 89 % 87 % 93 % 97 % 94 %   Oxygen Saturation (Exit) 96 % 93 % 94 % 92 % 98 %   Rating of Perceived Exertion (Exercise) _0 --   Perceived Dyspnea (Exercise) 1 1 0 4 1   Symptoms none SOB -- SOB on treadmill --   Comments -- second full day of exercise -- -- --   Duration -- Continue with 30 min of aerobic exercise without signs/symptoms of physical distress. Continue with 30 min of aerobic exercise without signs/symptoms of physical distress. Continue with 30 min of aerobic exercise without signs/symptoms of physical distress. Continue with 30 min of aerobic exercise without signs/symptoms of physical distress.   Intensity -- THRR unchanged THRR unchanged THRR unchanged THRR unchanged     Progression   Progression -- Continue to progress workloads to maintain intensity without signs/symptoms of physical distress. Continue to progress workloads to maintain intensity without signs/symptoms of physical distress. Continue to progress workloads to maintain intensity without signs/symptoms of physical distress. Continue to progress workloads to maintain intensity without signs/symptoms of physical distress.   Average METs -- 1.86 2.47 2.2 2.1     Resistance Training   Training Prescription -- Yes Yes Yes Yes   Weight -- 2 lb 2 lb  2 lb 2 lb   Reps -- 10-15 10-15 10-15 10-15     Interval Training   Interval Training -- No No No --     Treadmill   MPH -- 1.4 -- 1.4 1.4   Grade -- 0 -- 0 0   Minutes -- 15 -- 15 15   METs -- 2.07 -- 2.07 2.07     Recumbant Bike   Level -- -- 1 1 --   RPM -- -- 60 -- --   [place] -- -- -- 15 --   Minutes -- -- 15 15 --   METs -- -- 3.04 2.54 --     NuStep   Level -- -- _1 Minutes -- -- _2 METs -- -- 1.9 1.9 1.8     Recumbant Elliptical   Level -- 1 -- -- --   Minutes -- 15 -- -- --   METs -- 1.1 -- -- --     REL-XR   Level -- 1 -- -- --   Minutes -- 30 -- -- --   METs -- 2.4 -- -- --  Home Exercise Plan   Plans to continue exercise at -- -- -- Home (comment)  walking, elliptical Home (comment)  walking, elliptical   Frequency -- -- -- Add 2 additional days to program exercise sessions. Add 2 additional days to program exercise sessions.   Initial Home Exercises Provided -- -- -- 08/18/20 08/18/20   Row Name 09/23/20 1700             Response to Exercise   Blood Pressure (Admit) 118/66       Blood Pressure (Exercise) 128/64       Blood Pressure (Exit) 102/60       Heart Rate (Admit) 63 bpm       Heart Rate (Exercise) 70 bpm       Heart Rate (Exit) 64 bpm       Oxygen Saturation (Admit) 99 %       Oxygen Saturation (Exercise) 91 %       Oxygen Saturation (Exit) 99 %       Rating of Perceived Exertion (Exercise) 14       Perceived Dyspnea (Exercise) 1       Duration Continue with 30 min of aerobic exercise without signs/symptoms of physical distress.       Intensity THRR unchanged               Progression   Progression Continue to progress workloads to maintain intensity without signs/symptoms of physical distress.       Average METs 2.07               Resistance Training   Training Prescription Yes       Weight 2 lb       Reps 10-15               Interval Training   Interval Training No               Treadmill   MPH 1.4        Minutes 15       METs 2.07               REL-XR   Level 1       Minutes 15               Home Exercise Plan   Plans to continue exercise at Home (comment)  walking, elliptical       Frequency Add 2 additional days to program exercise sessions.       Initial Home Exercises Provided 08/18/20              Exercise Comments:  Exercise Comments    Row Name 07/21/20 1123           Exercise Comments First full day of exercise!  Patient was oriented to gym and equipment including functions, settings, policies, and procedures.  Patient's individual exercise prescription and treatment plan were reviewed.  All starting workloads were established based on the results of the 6 minute walk test done at initial orientation visit.  The plan for exercise progression was also introduced and progression will be customized based on patient's performance and goals.              Exercise Goals and Review:  Exercise Goals    Row Name 07/14/20 1447             Exercise Goals   Increase Physical Activity Yes       Intervention Provide advice, education, support  and counseling about physical activity/exercise needs.;Develop an individualized exercise prescription for aerobic and resistive training based on initial evaluation findings, risk stratification, comorbidities and participant's personal goals.       Expected Outcomes Short Term: Attend rehab on a regular basis to increase amount of physical activity.;Long Term: Add in home exercise to make exercise part of routine and to increase amount of physical activity.;Long Term: Exercising regularly at least 3-5 days a week.       Increase Strength and Stamina Yes       Intervention Provide advice, education, support and counseling about physical activity/exercise needs.;Develop an individualized exercise prescription for aerobic and resistive training based on initial evaluation findings, risk stratification, comorbidities and participant's  personal goals.       Expected Outcomes Short Term: Increase workloads from initial exercise prescription for resistance, speed, and METs.;Short Term: Perform resistance training exercises routinely during rehab and add in resistance training at home;Long Term: Improve cardiorespiratory fitness, muscular endurance and strength as measured by increased METs and functional capacity (6MWT)       Able to understand and use rate of perceived exertion (RPE) scale Yes       Intervention Provide education and explanation on how to use RPE scale       Expected Outcomes Short Term: Able to use RPE daily in rehab to express subjective intensity level;Long Term:  Able to use RPE to guide intensity level when exercising independently       Able to understand and use Dyspnea scale Yes       Intervention Provide education and explanation on how to use Dyspnea scale       Expected Outcomes Short Term: Able to use Dyspnea scale daily in rehab to express subjective sense of shortness of breath during exertion;Long Term: Able to use Dyspnea scale to guide intensity level when exercising independently       Knowledge and understanding of Target Heart Rate Range (THRR) Yes       Intervention Provide education and explanation of THRR including how the numbers were predicted and where they are located for reference       Expected Outcomes Short Term: Able to state/look up THRR;Long Term: Able to use THRR to govern intensity when exercising independently;Short Term: Able to use daily as guideline for intensity in rehab       Able to check pulse independently Yes       Intervention Provide education and demonstration on how to check pulse in carotid and radial arteries.;Review the importance of being able to check your own pulse for safety during independent exercise       Expected Outcomes Short Term: Able to explain why pulse checking is important during independent exercise;Long Term: Able to check pulse independently and  accurately       Understanding of Exercise Prescription Yes       Intervention Provide education, explanation, and written materials on patient's individual exercise prescription       Expected Outcomes Short Term: Able to explain program exercise prescription;Long Term: Able to explain home exercise prescription to exercise independently              Exercise Goals Re-Evaluation :  Exercise Goals Re-Evaluation    Row Name 07/21/20 1124 07/29/20 1004 08/11/20 1116 08/18/20 1134 08/25/20 1535     Exercise Goal Re-Evaluation   Exercise Goals Review Able to understand and use rate of perceived exertion (RPE) scale;Able to understand and use Dyspnea scale;Knowledge and understanding  of Target Heart Rate Range (THRR);Understanding of Exercise Prescription Increase Physical Activity;Increase Strength and Stamina;Understanding of Exercise Prescription Increase Physical Activity;Increase Strength and Stamina;Understanding of Exercise Prescription Increase Physical Activity;Increase Strength and Stamina Increase Physical Activity;Increase Strength and Stamina;Understanding of Exercise Prescription   Comments Reviewed RPE and dyspnea scales, THR and program prescription with pt today.  Pt voiced understanding and was given a copy of goals to take home. Yaniah is off to a good start in rehab.  She can completed her first two full days of exercise thus far.  We will continue to montior her progress. Skie uses a recumbant eliptical at home twice a week. She is going to try to go more than 15  minutes on her machine at home. She still needs home exercise and is increases her levels in rehab. Pt able to follow exercise prescription today without complaint.  Will continue to monitor for progression.  Reviewed home exercise with pt today.  Pt plans to use elliptical and walk for exercise.  Reviewed THR, pulse, RPE, sign and symptoms, pulse oximetery and when to call 911 or MD.  Also discussed weather considerations  and indoor options.  Pt voiced understanding. Alexa is doing well in rehab.  She has missed her last two session due to GI issues and dental surgery.  She is up to 15 watts on the bike.  We will continue to montior her progress.   Expected Outcomes Short: Use RPE daily to regulate intensity. Long: Follow program prescription in THR. Short: Continue to attend rehab regularly Long: Continue to follow program prescription. Short: increase honme exercise. Long: maintain exercise at home independently Short: monitor HR and O2 when exercising at home Long: maintain exercise on her own Short: Improved attendance again Long: Continue to improve stamina   [place] Name 09/09/20 1401 09/23/20 1749 10/06/20 1132         Exercise Goal Re-Evaluation   Exercise Goals Review Increase Physical Activity;Increase Strength and Stamina -- Increase Strength and Stamina     Comments Jowanna continues to attend consistently.  Oxygen levels stay in the mid 90s during exercise.  Staff will encourage increasing levels/adding incline. Maleah is ready to increase levels for exercise.  Staff will review THR and how to increase safely. Aruna has been able to improve her strength and stamina in rehab. She states that she has not been able to do 80 steps per minute on the NuStep but has been able to.     Expected Outcomes Short: increase levels Long: increase MET level Short:increase levels on machines Long:  improve overall stamina Short: workout post [facility]. Long: maintain working out independently.            Discharge Exercise Prescription (Final Exercise Prescription Changes):  Exercise Prescription Changes - 09/23/20 1700      Response to Exercise   Blood Pressure (Admit) 118/66    Blood Pressure (Exercise) 128/64    Blood Pressure (Exit) 102/60    Heart Rate (Admit) 63 bpm    Heart Rate (Exercise) 70 bpm    Heart Rate (Exit) 64 bpm    Oxygen Saturation (Admit) 99 %    Oxygen Saturation (Exercise) 91 %    Oxygen  Saturation (Exit) 99 %    Rating of Perceived Exertion (Exercise) 14    Perceived Dyspnea (Exercise) 1    Duration Continue with 30 min of aerobic exercise without signs/symptoms of physical distress.    Intensity THRR unchanged      Progression  Progression Continue to progress workloads to maintain intensity without signs/symptoms of physical distress.    Average METs 2.07      Resistance Training   Training Prescription Yes    Weight 2 lb    Reps 10-15      Interval Training   Interval Training No      Treadmill   MPH 1.4    Minutes 15    METs 2.07      REL-XR   Level 1    Minutes 15      Home Exercise Plan   Plans to continue exercise at Home (comment)   walking, elliptical   Frequency Add 2 additional days to program exercise sessions.    Initial Home Exercises Provided 08/18/20           Nutrition:  Target Goals: Understanding of nutrition guidelines, daily intake of sodium <153m, cholesterol <2055m calories 30% from fat and 7% or less from saturated fats, daily to have 5 or more servings of fruits and vegetables.  Education: All About Nutrition: -Group instruction provided by verbal, written material, interactive activities, discussions, models, and posters to present general guidelines for heart healthy nutrition including fat, fiber, MyPlate, the role of sodium in heart healthy nutrition, utilization of the nutrition label, and utilization of this knowledge for meal planning. Follow up email sent as well. Written material given at graduation. [facility]  Date 10/15/20  [facility] Review Code 1- Verbalizes Understanding      Biometrics:  Pre Biometrics - 07/14/20 1450      Pre Biometrics   Height 5' 7.5" (1.715 m)    Weight 165 lb 6.4 oz (75 kg)    BMI (Calculated) 25.51    Single Leg Stand 4.65 seconds            Nutrition Therapy Plan and Nutrition Goals:   Nutrition Therapy & Goals - 08/05/20 1533      Nutrition Therapy   Diet Heart healthy, low Na, diabetes friendly    Drug/Food Interactions Statins/Certain Fruits    Protein (specify units) 60g    Fiber 25 grams    Whole Grain Foods 3 servings    Saturated Fats 12 max. grams    Fruits and Vegetables 8 servings/day    Sodium 1.5 grams      Personal Nutrition Goals   Nutrition Goal ST: practice counting carbohydrates LT: A1C back to 7    Comments A1C is 8. She is on insulin. B: whole wheat toast (some regular jelly) with tuKu[place]acon and egg - coffee with stevia L: potpie with madarin oranges today D: tossed salad with cheese and processed ham yesterday, today she will have fish (broiled) with sweet potato and slaw. Drinks: water, juice (lemonade and crangrape - 1/2 glass) - with lunch. AM 70, now 128 before eating today - she is normally around ~120. Payton reported being very confused with macronutrients as well as diabetes friendly eating. Discussed all macronutrients as well as diabetes friendly eating  - patient voiced understanding, will review at goal time and encouraged attending education. She is limiting her sugar sweetened beverages and is giving up soda for LeMalena Edman will use juice as her emergency sugar instead.      Intervention Plan   Intervention Prescribe, educate and counsel regarding individualized specific dietary modifications aiming towards targeted core components such as weight, hypertension, lipid management, diabetes, heart failure and other comorbidities.;Nutrition handout(s)  given to patient.    Expected Outcomes Short Term Goal: Understand basic principles of dietary content, such as calories, fat, sodium, cholesterol and nutrients.;Short Term Goal: A plan has been developed with personal nutrition goals set during dietitian appointment.;Long Term Goal: Adherence to prescribed nutrition plan.           Nutrition Assessments:  MEDIFICTS Score Key:  ?70 Need to make  dietary changes   40-70 Heart Healthy Diet  ? 40 Therapeutic Level Cholesterol Diet  Flowsheet Row Pulmonary Rehab from [facility]/14/2020 in [facility] Pul[facility]  Picture Your Plate Total Score on Admission 56     Picture Your Plate Scores:  <16 Unhealthy dietary pattern with much room for improvement.  41-50 Dietary pattern unlikely to meet recommendations for good health and room for improvement.  51-60 More healthful dietary pattern, with some room for improvement.   >60 Healthy dietary pattern, although there may be some specific behaviors that could be improved.   Nutrition Goals Re-Evaluation:  Nutrition Goals Re-Evaluation    [place] Name 08/18/20 1218 09/10/20 1107 10/06/20 1124 10/08/20 1106       Goals   Current Weight -- 165 lb (74.8 kg) 163 lb (73.9 kg) --    Nutrition Goal -- Keep blood sugar in check. eat a diet that pertains to her needs. ST: practice reflux MNT LT: reduce reflux symptoms    Comment Dorota reports having problems with reflux right now and is taking tums to combat it. It occurs mostly at night and wakes her up - she eats dinner at 6pm and feels reflux at 12am. Today she had reflux when exercising at 11am - she ate oatmeal at 9am. Suggested that reflux can be cause by a number of things, however, this may be a hunger response - suggested having a small snack before rehab and after dinner to see if this helps at all. She reports she has not chnaged what she was eating - this could also be a stress response. Hoda states that her reflex has been 98 percent better. She has been taking medication to help her reflux. She is checking her sugars at home and has been having lower blood sugar checks. Dorlisa feels like she is eating well and has no questions about her nutrition at this time. Shemica reports continuing issues with reflux, she feels the medication isn't helping much. Discussed reflux MNT: avoiding/limiting caffiene, chocolate, spicy foods, acidic foods, high  fat foods, high amounts of added sugar and eating small frequent meals to avoid over-filling stomach.    Expected Outcome -- Short: continue to eat food that help keep Blood sugar in check. Long: maintain a heart healthy diet. Short: continue current diet. Long: maintain diet independently. ST: practice reflux MNT LT: reduce reflux symptoms           Nutrition Goals Discharge (Final Nutrition Goals Re-Evaluation):  Nutrition Goals Re-Evaluation - 10/08/20 1106      Goals   Nutrition Goal ST: practice reflux MNT LT: reduce reflux symptoms    Comment Eavan reports continuing issues with reflux, she feels the medication isn't helping much. Discussed reflux MNT: avoiding/limiting caffiene, chocolate, spicy foods, acidic foods, high fat foods, high amounts of added sugar and eating small frequent meals to avoid over-filling stomach.    Expected Outcome ST: practice reflux MNT LT: reduce reflux symptoms           Psychosocial: Target Goals: Acknowledge presence or absence of significant depression and/or stress, maximize coping skills,  provide positive support system. Participant is able to verbalize types and ability to use techniques and skills needed for reducing stress and depression.   Education: Stress, Anxiety, and Depression - Group verbal and visual presentation to define topics covered.  Reviews how body is impacted by stress, anxiety, and depression.  Also discusses healthy ways to reduce stress and to treat/manage anxiety and depression.  Written material given at graduation. Flowsheet Row Pulmonary Rehab from 10/15/2020 in [facility] Cardiac and Pulmonary Rehab  Date 09/10/20  Educator [facility]  Instruction Review Code 1- [employer] Understanding      Education: Sleep Hygiene -Provides group verbal and written instruction about how sleep can affect your health.  Define sleep hygiene, discuss sleep cycles and impact of sleep habits. Review good sleep hygiene tips.    Initial Review &  Psychosocial Screening:  Initial Psych Review & Screening - 07/06/20 1539      Initial Review   Current issues with Current Sleep Concerns;Current Depression;History of Depression      Family Dynamics   Good Support System? Yes   daughter     Barriers   Psychosocial barriers to participate in program The patient should benefit from training in stress management and relaxation.      Screening Interventions   Interventions Encouraged to exercise;Provide feedback about the scores to participant;To provide support and resources with identified psychosocial needs    Expected Outcomes Short Term goal: Utilizing psychosocial counselor, staff and physician to assist with identification of specific Stressors or current issues interfering with healing process. Setting desired goal for each stressor or current issue identified.;Long Term Goal: Stressors or current issues are controlled or eliminated.;Short Term goal: Identification and review with participant of any Quality of Life or Depression concerns found by scoring the questionnaire.;Long Term goal: The participant improves quality of Life and PHQ9 Scores as seen by post scores and/or verbalization of changes           Quality of Life Scores:  Scores of 19 and below usually indicate a poorer quality of life in these areas.  A difference of  2-3 points is a clinically meaningful difference.  A difference of 2-3 points in the total score of the Quality of Life Index has been associated with significant improvement in overall quality of life, self-image, physical symptoms, and general health in studies assessing change in quality of life.  PHQ-9: Recent Review Flowsheet Data    Depression screen [facility] 2/9 10/06/2020 08/11/2020 07/14/2020 05/01/2017 02/27/2017   Decreased Interest 0 _0 Down, Depressed, Hopeless 0 0 _1 PHQ - 2 Score 0 _2 Altered sleeping 1 2 0 0 0   Tired, decreased energy _3 Change in appetite 2 0 0 2 2    Feeling bad or failure about yourself  0 0 0 2 3   Trouble concentrating 0 0 0 2 2   Moving slowly or fidgety/restless 0 0 0 0 0   Suicidal thoughts 0 0 0 0 2    PHQ-9 Score _4 Difficult doing work/chores Not difficult at all Not difficult at all Not difficult at all Not difficult at all Somewhat difficult     Interpretation of Total Score  Total Score Depression Severity:  1-4 = Minimal depression, 5-9 = Mild depression, 10-14 = Moderate depression, 15-19 = Moderately severe depression, 20-27 = Severe depression   Psychosocial  Evaluation and Intervention:  Psychosocial Evaluation - 07/06/20 1556      Psychosocial Evaluation & Interventions   Interventions Stress management education;Relaxation education;Encouraged to exercise with the program and follow exercise prescription    Comments Ming is returning to Pulmonary Rehab with pulmonary HTN. She states she doesn't have stress because she "gets up and lives each day" she also stated that her feelings on depression are "who cares? we get up and do the same thing me and my husband so what difference does it make on my quality of life." She doesn't want to take any more medication even if it does help with mood. When she was in the program before, the counselor encouraged her to seek help from a therapist for help with depression but she said she could never get a hold of someone. She is seeing her primary doctor next week and said she would mention it to them. Her daughter and son are local and help with whatever she needs. COVID has prevented her from being able to exercise and get out of the house, so she thinks coming to pulmonary rehab will help get her back into a routine even though she is really only coming to please her daughter and doctor.    Expected Outcomes Short: attend pulmonary rehab for education and exercise. Long: develop positive self care habits.    Continue Psychosocial Services  Follow up required by staff            Psychosocial Re-Evaluation:  Psychosocial Re-Evaluation    [place] Name 08/11/20 1123 09/10/20 1103 10/06/20 1126         Psychosocial Re-Evaluation   Current issues with History of Depression;Current Sleep Concerns Current Sleep Concerns;Current Stress Concerns Current Stress Concerns;Current Sleep Concerns     Comments Reviewed patient health questionnaire (PHQ-9) with patient for follow up. Previously, patients score indicated signs/symptoms of depression.  Reviewed to see if patient is improving symptom wise while in program.  Score declined and patient states that it is because she has little energy. Sometimes she has trouble sleeping. Her husbands sister has passed away and her Nephew took it really hard. She had alot of feels and nervousness over her nephew taking it so hard. She felt like she only had three hours of sleep despite taking melatonin. She is going to speak with her doctor about being tired and not sleeping well. She thinks maybe she needs diferent medications. Reviewed patient health questionnaire (PHQ-9) with patient for follow up. Previously, patients score indicated signs/symptoms of depression.  Reviewed to see if patient is improving symptom wise while in program.  Score stayed the same and patient states that it is because she never has energy. She states that she meditates at home and it helps her mood alot.     Expected Outcomes Short: Continue to work toward an improvement in [place] scores by attending [facility] regularly. Long: Continue to improve stress and depression coping skills by talking with staff and attending [facility] regularly and work toward a positive mental state. Short: go to the doctor to talk about medications. Long: get better sleep. Short: Continue to work toward an improvement in [place] scores by attending [facility] regularly. Long: Continue to improve stress and depression coping skills by talking with staff and attending [facility] regularly and work  toward a positive mental state.     Interventions Encouraged to attend Pulmonary Rehabilitation for the exercise;Relaxation education Encouraged to attend Pulmonary Rehabilitation for the exercise Encouraged to attend Pulmonary Rehabilitation for  the exercise     Continue Psychosocial Services  Follow up required by staff Follow up required by staff Follow up required by staff            Psychosocial Discharge (Final Psychosocial Re-Evaluation):  Psychosocial Re-Evaluation - 10/06/20 1126      Psychosocial Re-Evaluation   Current issues with Current Stress Concerns;Current Sleep Concerns    Comments Reviewed patient health questionnaire (PHQ-9) with patient for follow up. Previously, patients score indicated signs/symptoms of depression.  Reviewed to see if patient is improving symptom wise while in program.  Score stayed the same and patient states that it is because she never has energy. She states that she meditates at home and it helps her mood alot.    Expected Outcomes Short: Continue to work toward an improvement in [place] scores by attending [facility] regularly. Long: Continue to improve stress and depression coping skills by talking with staff and attending [facility] regularly and work toward a positive mental state.    Interventions Encouraged to attend Pulmonary Rehabilitation for the exercise    Continue Psychosocial Services  Follow up required by staff           Education: Education Goals: Education classes will be provided on a weekly basis, covering required topics. Participant will state understanding/return demonstration of topics presented.  Learning Barriers/Preferences:  Learning Barriers/Preferences - 07/06/20 1554      Learning Barriers/Preferences   Learning Barriers None    Learning Preferences None           General Pulmonary Education Topics:  Infection Prevention: - Provides verbal and written material to individual with discussion of infection control  including proper hand washing and proper equipment cleaning during exercise session. Flowsheet Row Pulmonary Rehab from 10/15/2020 in [facility] Cardiac and Pulmonary Rehab  Date 07/14/20  Educator [place]  Instruction Review Code 1- Verbalizes Understanding      Falls Prevention: - Provides verbal and written material to individual with discussion of falls prevention and safety. Flowsheet Row Pulmonary Rehab from 10/15/2020 in [facility] Cardiac and Pulmonary Rehab  Date 07/14/20  Educator AS  Instruction Review Code 1- Verbalizes Understanding      Chronic Lung Disease Review: - Group verbal instruction with posters, models, PowerPoint presentations and videos,  to review new updates, new respiratory medications, new advancements in procedures and treatments. Providing information on websites and "800" numbers for continued self-education. Includes information about supplement oxygen, available portable oxygen systems, continuous and intermittent flow rates, oxygen safety, concentrators, and Medicare reimbursement for oxygen. Explanation of Pulmonary Drugs, including class, frequency, complications, importance of spacers, rinsing mouth after steroid MDI's, and proper cleaning methods for nebulizers. Review of basic lung anatomy and physiology related to function, structure, and complications of lung disease. Review of risk factors. Discussion about methods for diagnosing sleep apnea and types of masks and machines for OSA. Includes a review of the use of types of environmental controls: home humidity, furnaces, filters, dust mite/pet prevention, HEPA vacuums. Discussion about weather changes, air quality and the benefits of nasal washing. Instruction on Warning signs, infection symptoms, calling MD promptly, preventive modes, and value of vaccinations. Review of effective airway clearance, coughing and/or vibration techniques. Emphasizing that all should Create an Action Plan. Written material given at  graduation. Flowsheet Row Pulmonary Rehab from 10/15/2020 in [facility] Cardiac and Pulmonary Rehab  Date 09/03/20  Educator [facility]  Instruction Review Code 1- Verbalizes Understanding      AED/CPR: - Group verbal and written instruction with the  use of models to demonstrate the basic use of the AED with the basic ABC's of resuscitation.    Anatomy and Cardiac Procedures: - Group verbal and visual presentation and models provide information about basic cardiac anatomy and function. Reviews the testing methods done to diagnose heart disease and the outcomes of the test results. Describes the treatment choices: Medical Management, Angioplasty, or Coronary Bypass Surgery for treating various heart conditions including Myocardial Infarction, Angina, Valve Disease, and Cardiac Arrhythmias.  Written material given at graduation. Flowsheet Row Pulmonary Rehab from 10/15/2020 in [facility] Cardiac and Pulmonary Rehab  Date 07/30/20  Educator [facility]  Instruction Review Code 1- Verbalizes Understanding      Medication Safety: - Group verbal and visual instruction to review commonly prescribed medications for heart and lung disease. Reviews the medication, class of the drug, and side effects. Includes the steps to properly store meds and maintain the prescription regimen.  Written material given at graduation. Flowsheet Row Pulmonary Rehab from 06/23/2017 in [facility] Cardiac and Pulmonary Rehab  Date 05/12/17  Educator [place]  Instruction Review Code 1- Verbalizes Understanding      Other: -Provides group and verbal instruction on various topics (see comments)   Knowledge Questionnaire Score:  Knowledge Questionnaire Score - 07/14/20 1439      Knowledge Questionnaire Score   Pre Score 13/18  oxygen            Core Components/Risk Factors/Patient Goals at Admission:  Personal Goals and Risk Factors at Admission - 07/14/20 1445      Core Components/Risk Factors/Patient Goals on Admission    Weight Management  Weight Maintenance;Yes    Intervention Weight Management: Develop a combined nutrition and exercise program designed to reach desired caloric intake, while maintaining appropriate intake of nutrient and fiber, sodium and fats, and appropriate energy expenditure required for the weight goal.;Weight Management: Provide education and appropriate resources to help participant work on and attain dietary goals.;Weight Management/Obesity: Establish reasonable short term and long term weight goals.    Admit Weight 165 lb 6.4 oz (75 kg)    Goal Weight: Short Term 160 lb (72.6 kg)    Goal Weight: Long Term 160 lb (72.6 kg)    Expected Outcomes Short Term: Continue to assess and modify interventions until short term weight is achieved;Long Term: Adherence to nutrition and physical activity/exercise program aimed toward attainment of established weight goal;Weight Maintenance: Understanding of the daily nutrition guidelines, which includes 25-35% calories from fat, 7% or less cal from saturated fats, less than 2103m cholesterol, less than 1.5gm of sodium, & 5 or more servings of fruits and vegetables daily;Understanding recommendations for meals to include 15-35% energy as protein, 25-35% energy from fat, 35-60% energy from carbohydrates, less than 2078mof dietary cholesterol, 20-35 gm of total fiber daily;Understanding of distribution of calorie intake throughout the day with the consumption of 4-5 meals/snacks    Improve shortness of breath with ADL's Yes    Intervention Provide education, individualized exercise plan and daily activity instruction to help decrease symptoms of SOB with activities of daily living.    Expected Outcomes Short Term: Improve cardiorespiratory fitness to achieve a reduction of symptoms when performing ADLs;Long Term: Be able to perform more ADLs without symptoms or delay the onset of symptoms    Diabetes Yes    Intervention Provide education about signs/symptoms and action to take for  hypo/hyperglycemia.;Provide education about proper nutrition, including hydration, and aerobic/resistive exercise prescription along with prescribed medications to achieve blood glucose in normal ranges:  Fasting glucose 65-99 mg/dL    Expected Outcomes Short Term: Participant verbalizes understanding of the signs/symptoms and immediate care of hyper/hypoglycemia, proper foot care and importance of medication, aerobic/resistive exercise and nutrition plan for blood glucose control.;Long Term: Attainment of HbA1C < 7%.    Heart Failure Yes    Intervention Provide a combined exercise and nutrition program that is supplemented with education, support and counseling about heart failure. Directed toward relieving symptoms such as shortness of breath, decreased exercise tolerance, and extremity edema.    Expected Outcomes Improve functional capacity of life;Short term: Attendance in program 2-3 days a week with increased exercise capacity. Reported lower sodium intake. Reported increased fruit and vegetable intake. Reports medication compliance.;Short term: Daily weights obtained and reported for increase. Utilizing diuretic protocols set by physician.;Long term: Adoption of self-care skills and reduction of barriers for early signs and symptoms recognition and intervention leading to self-care maintenance.    Hypertension Yes    Intervention Provide education on lifestyle modifcations including regular physical activity/exercise, weight management, moderate sodium restriction and increased consumption of fresh fruit, vegetables, and low fat dairy, alcohol moderation, and smoking cessation.;Monitor prescription use compliance.    Expected Outcomes Short Term: Continued assessment and intervention until BP is < 140/82m HG in hypertensive participants. < 130/876mHG in hypertensive participants with diabetes, heart failure or chronic kidney disease.;Long Term: Maintenance of blood pressure at goal levels.    Lipids  Yes    Intervention Provide education and support for participant on nutrition & aerobic/resistive exercise along with prescribed medications to achieve LDL <7054mHDL >76m4m  Expected Outcomes Short Term: Participant states understanding of desired cholesterol values and is compliant with medications prescribed. Participant is following exercise prescription and nutrition guidelines.;Long Term: Cholesterol controlled with medications as prescribed, with individualized exercise RX and with personalized nutrition plan. Value goals: LDL < 70mg71mL > 40 mg.           Education:Diabetes - Individual verbal and written instruction to review signs/symptoms of diabetes, desired ranges of glucose level fasting, after meals and with exercise. Acknowledge that pre and post exercise glucose checks will be done for 3 sessions at entry of program. Flowsheet Row [facility]  Date 07/06/20  Educator MC  [facility] Code 1- Verbalizes Understanding      Know Your Numbers and Heart Failure: - Group verbal and visual instruction to discuss disease risk factors for cardiac and pulmonary disease and treatment options.  Reviews associated critical values for Overweight/Obesity, Hypertension, Cholesterol, and Diabetes.  Discusses basics of heart failure: signs/symptoms and treatments.  Introduces Heart Failure Zone chart for action plan for heart failure.  Written material given at graduation. Flowsheet Row [facility]  Date 08/27/20  Educator MC  [facility] Review Code 1- Verbalizes Understanding      Core Components/Risk Factors/Patient Goals Review:   Goals and Risk Factor Review    Row Name 08/11/20 1119 09/10/20 1109 10/06/20 1127         Core Components/Risk Factors/Patient Goals Review   Personal Goals Review Improve shortness of breath with ADL's Weight  Management/Obesity;Diabetes Weight Management/Obesity     Review Spoke to patient about their shortness of breath and what they can do to improve. Patient has been informed of breathing techniques when starting the program. Patient is informed to tell staff if they have had any med changes and that certain meds they  are taking or not taking can be causing shortness of breath. Zikeria is doing well in maintaining her weight. She is checking her sugars everyday and is keeping her sugar under control. She is 165 pounds today and wants to maintain. Jesiah has no current concerns with her weight and is maintaining. She has no questions about her medications. Blood pressure has been stable.     Expected Outcomes Short: Attend [facility] regularly to improve shortness of breath with ADL's. Long: maintain independence with ADL's Short: continue to maintain weight. Long: maintain weight independently after [facility]. Short: continue to attened [facility] regularly. Long: Graduate [facility].            Core Components/Risk Factors/Patient Goals at Discharge (Final Review):   Goals and Risk Factor Review - 10/06/20 1127      Core Components/Risk Factors/Patient Goals Review   Personal Goals Review Weight Management/Obesity    Review Maylene has no current concerns with her weight and is maintaining. She has no questions about her medications. Blood pressure has been stable.    Expected Outcomes Short: continue to attened [facility] regularly. Long: Graduate [facility].           ITP Comments:  ITP Comments    Row Name 07/06/20 1536 07/14/20 1452 07/21/20 1123 07/29/20 0614 08/05/20 1532   ITP Comments Initial telephone orientation completed. Diagnosis can be found in 11/12. EP orientation scheduled for 2/8 at 1pm Completed 6MWT and gym orientation. Initial ITP created and sent for review to Dr. Emily Filbert, Medical Director. First full day of exercise!  Patient was oriented to gym and equipment including  functions, settings, policies, and procedures.  Patient's individual exercise prescription and treatment plan were reviewed.  All starting workloads were established based on the results of the 6 minute walk test done at initial orientation visit.  The plan for exercise progression was also introduced and progression will be customized based on patient's performance and goals. 30 Day review completed. Medical Director ITP review done, changes made as directed, and signed approval by Medical Director. Completed Initial RD evaluation   Row Name 08/26/20 [phone] 09/23/20 0933 10/21/20 0626       ITP Comments 30 Day review completed. Medical Director ITP review done, changes made as directed, and signed approval by Medical Director. 30 Day review completed. Medical Director ITP review done, changes made as directed, and signed approval by Medical Director. 30 Day review completed. Medical Director ITP review done, changes made as directed, and signed approval by Medical Director.            Comments:

## 2020-10-22 ENCOUNTER — Other Ambulatory Visit: Payer: Self-pay

## 2020-10-22 DIAGNOSIS — I272 Pulmonary hypertension, unspecified: Secondary | ICD-10-CM | POA: Diagnosis not present

## 2020-10-22 NOTE — Progress Notes (Signed)
Daily Session Note  Patient Details  Name: Jamie Murray MRN: [id number] Date of Birth: [birth year] Referring Provider:   [name] Pulmonary Rehab from 07/14/2020 in [facility] Pulmonary Rehab  Referring Provider [name]      Encounter Date: 10/22/2020  Check In:  Session Check In - 10/22/20 1040      Check-In   Supervising physician immediately available to respond to emergencies See telemetry face sheet for immediately available ER MD    Location [facility]-Cardiac & Pulmonary Rehab    Staff Present Birdie Sons, MPA, RN;Melissa Caiola RDN, Rowe Pavy, BA, ACSM CEP, Exercise Physiologist    Virtual Visit No    Medication changes reported     No    Fall or balance concerns reported    No    Warm-up and Cool-down Performed on first and last piece of equipment    Resistance Training Performed Yes    VAD Patient? No    PAD/SET Patient? No      Pain Assessment   Currently in Pain? No/denies              Social History   Tobacco Use  Smoking Status Never Smoker  Smokeless Tobacco Never Used    Goals Met:  Independence with exercise equipment Exercise tolerated well No report of cardiac concerns or symptoms Strength training completed today  Goals Unmet:  Not Applicable  Comments: Pt able to follow exercise prescription today without complaint.  Will continue to monitor for progression.    Dr. Emily Filbert is Medical Director for [place] and [facility] Pulmonary Rehabilitation.

## 2020-10-29 ENCOUNTER — Other Ambulatory Visit: Payer: Self-pay

## 2020-10-29 DIAGNOSIS — I272 Pulmonary hypertension, unspecified: Secondary | ICD-10-CM | POA: Diagnosis not present

## 2020-10-29 NOTE — Progress Notes (Signed)
Daily Session Note  Patient Details  Name: Jamie Murray MRN: [id number] Date of Birth: 24-Mar-1946 Referring Provider:   [name] Pulmonary Rehab from 07/14/2020 in [facility] Cardiac and Pulmonary Rehab  Referring Provider [name]      Encounter Date: 10/29/2020  Check In:  Session Check In - 10/29/20 1105      Check-In   Supervising physician immediately available to respond to emergencies See telemetry face sheet for immediately available ER MD    Location [facility] & Pulmonary Rehab    Staff Present Birdie Sons, MPA, RN;Melissa Caiola RDN, LDN;Jessica Luan Pulling, MA, RCEP, CCRP, CCET    Virtual Visit No    Medication changes reported     No    Fall or balance concerns reported    No    Warm-up and Cool-down Performed on first and last piece of equipment    Resistance Training Performed Yes    VAD Patient? No    PAD/SET Patient? No      Pain Assessment   Currently in Pain? No/denies              Social History   Tobacco Use  Smoking Status Never Smoker  Smokeless Tobacco Never Used    Goals Met:  Independence with exercise equipment Exercise tolerated well No report of cardiac concerns or symptoms Strength training completed today  Goals Unmet:  Not Applicable  Comments: Pt able to follow exercise prescription today without complaint.  Will continue to monitor for progression.    Dr. Emily Filbert is Medical Director for [place].  Dr. Ottie Glazier is Medical Director for [facility] Pulmonary Rehabilitation.

## 2020-11-03 ENCOUNTER — Other Ambulatory Visit: Payer: Self-pay

## 2020-11-03 ENCOUNTER — Encounter: Payer: Medicare PPO | Admitting: *Deleted

## 2020-11-03 DIAGNOSIS — I272 Pulmonary hypertension, unspecified: Secondary | ICD-10-CM

## 2020-11-03 NOTE — Progress Notes (Signed)
Daily Session Note  Patient Details  Name: Jamie Murray MRN: [id number] Date of Birth: January 05, 1946 Referring Provider:   [name] Pulmonary Rehab from 07/14/2020 in [facility] Cardiac and Pulmonary Rehab  Referring Provider [name]      Encounter Date: 11/03/2020  Check In:  Session Check In - 11/03/20 1146      Check-In   Supervising physician immediately available to respond to emergencies See telemetry face sheet for immediately available ER MD    Location [facility] & Pul[facility] Re[facility]    Staff Present Heath Lark, RN, BSN, Jacklynn Bue, MS, ASCM CEP, Exercise Physiologist;Melissa Caiola RDN, LDN    Virtual Visit No    Medication changes reported     No    Fall or balance concerns reported    No    Warm-up and Cool-down Performed on first and last piece of equipment    Resistance Training Performed Yes    VAD Patient? No    PAD/SET Patient? No      Pain Assessment   Currently in Pain? No/denies              Social History   Tobacco Use  Smoking Status Never Smoker  Smokeless Tobacco Never Used    Goals Met:  Proper associated with RPD/PD & O2 Sat Independence with exercise equipment Exercise tolerated well No report of cardiac concerns or symptoms  Goals Unmet:  Not Applicable  Comments: Pt able to follow exercise prescription today without complaint.  Will continue to monitor for progression.    Dr. Emily Filbert is Medical Director for [place].  Dr. Ottie Glazier is Medical Director for [facility] Pulmonary Rehabilitation.

## 2020-11-05 ENCOUNTER — Other Ambulatory Visit: Payer: Self-pay

## 2020-11-05 ENCOUNTER — Encounter: Payer: Medicare PPO | Attending: Cardiology | Admitting: *Deleted

## 2020-11-05 DIAGNOSIS — I272 Pulmonary hypertension, unspecified: Secondary | ICD-10-CM | POA: Diagnosis not present

## 2020-11-05 NOTE — Progress Notes (Signed)
Daily Session Note  Patient Details  Name: Jamie Murray MRN: [id number] Date of Birth: [birth year] Referring Provider:   [name] Pulmonary Rehab from 07/14/2020 in [facility] Cardiac and Pulmonary Rehab  Referring Provider [name]      Encounter Date: 11/05/2020  Check In:  Session Check In - 11/05/20 1116      Check-In   Supervising physician immediately available to respond to emergencies See telemetry face sheet for immediately available ER MD    Location [facility] & Pulmonary Rehab    Staff Present Renita Papa, RN BSN;Jessica Luan Pulling, MA, RCEP, CCRP, CCET;Melissa [place] RDN, LDN    Virtual Visit No    Medication changes reported     No    Fall or balance concerns reported    No    Warm-up and Cool-down Performed on first and last piece of equipment    Resistance Training Performed Yes    VAD Patient? No    PAD/SET Patient? No      Pain Assessment   Currently in Pain? No/denies              Social History   Tobacco Use  Smoking Status Never Smoker  Smokeless Tobacco Never Used    Goals Met:  Independence with exercise equipment Exercise tolerated well No report of cardiac concerns or symptoms Strength training completed today  Goals Unmet:  Not Applicable  Comments: Pt able to follow exercise prescription today without complaint.  Will continue to monitor for progression.    Dr. Emily Filbert is Medical Director for [place].  Dr. Ottie Glazier is Medical Director for [facility] Pulmonary Rehabilitation.

## 2020-11-10 ENCOUNTER — Other Ambulatory Visit: Payer: Self-pay

## 2020-11-10 DIAGNOSIS — I272 Pulmonary hypertension, unspecified: Secondary | ICD-10-CM | POA: Diagnosis not present

## 2020-11-10 NOTE — Progress Notes (Signed)
Daily Session Note  Patient Details  Name: Jamie Murray MRN: [id number] Date of Birth: Apr 16, 1946 Referring Provider:   [name] Pulmonary Rehab from 07/14/2020 in [facility] Cardiac and Pulmonary Rehab  Referring Provider [name]      Encounter Date: 11/10/2020  Check In:  Session Check In - 11/10/20 1110      Check-In   Supervising physician immediately available to respond to emergencies See telemetry face sheet for immediately available ER MD    Location [facility] & Pulmonary Rehab    Staff Present Birdie Sons, MPA, RN;Melissa Caiola RDN, Rowe Pavy, BA, ACSM CEP, Exercise Physiologist;Joseph Tessie Fass RCP,RRT,BSRT    Virtual Visit No    Medication changes reported     No    Fall or balance concerns reported    No    Warm-up and Cool-down Performed on first and last piece of equipment    Resistance Training Performed Yes    VAD Patient? No    PAD/SET Patient? No      Pain Assessment   Currently in Pain? No/denies              Social History   Tobacco Use  Smoking Status Never Smoker  Smokeless Tobacco Never Used    Goals Met:  Independence with exercise equipment Exercise tolerated well No report of cardiac concerns or symptoms Strength training completed today  Goals Unmet:  Not Applicable  Comments: Pt able to follow exercise prescription today without complaint.  Will continue to monitor for progression.    Dr. Emily Filbert is Medical Director for [place].  Dr. Ottie Glazier is Medical Director for [facility] Pulmonary Rehabilitation.

## 2020-11-11 DIAGNOSIS — N1832 Chronic kidney disease, stage 3b: Secondary | ICD-10-CM | POA: Diagnosis not present

## 2020-11-11 DIAGNOSIS — I10 Essential (primary) hypertension: Secondary | ICD-10-CM | POA: Diagnosis not present

## 2020-11-11 DIAGNOSIS — R8290 Unspecified abnormal findings in urine: Secondary | ICD-10-CM | POA: Diagnosis not present

## 2020-11-11 DIAGNOSIS — E1122 Type 2 diabetes mellitus with diabetic chronic kidney disease: Secondary | ICD-10-CM | POA: Diagnosis not present

## 2020-11-12 ENCOUNTER — Other Ambulatory Visit: Payer: Self-pay

## 2020-11-12 DIAGNOSIS — I272 Pulmonary hypertension, unspecified: Secondary | ICD-10-CM

## 2020-11-12 NOTE — Progress Notes (Signed)
Daily Session Note  Patient Details  Name: Jamie Murray MRN: [id number] Date of Birth: 1946/02/03 Referring Provider:   [name] Pulmonary Rehab from 07/14/2020 in [facility] Cardiac and Pulmonary Rehab  Referring Provider [name]       Encounter Date: 11/12/2020  Check In:  Session Check In - 11/12/20 1030       Check-In   Supervising physician immediately available to respond to emergencies See telemetry face sheet for immediately available ER MD    Location [facility] & Pul[facility] Rehab    Staff Present Birdie Sons, MPA, RN;Melissa Caiola RDN, Rowe Pavy, BA, ACSM CEP, Exercise Physiologist    Virtual Visit No    Medication changes reported     No    Fall or balance concerns reported    No    Warm-up and Cool-down Performed on first and last piece of equipment    Resistance Training Performed Yes    VAD Patient? No    PAD/SET Patient? No      Pain Assessment   Currently in Pain? No/denies                Social History   Tobacco Use  Smoking Status Never  Smokeless Tobacco Never    Goals Met:  Independence with exercise equipment Exercise tolerated well No report of cardiac concerns or symptoms Strength training completed today  Goals Unmet:  Not Applicable  Comments: Pt able to follow exercise prescription today without complaint.  Will continue to monitor for progression.    Dr. Emily Filbert is Medical Director for [place].  Dr. Ottie Glazier is Medical Director for [facility] Pulmonary Rehabilitation.

## 2020-11-17 ENCOUNTER — Other Ambulatory Visit: Payer: Self-pay

## 2020-11-17 DIAGNOSIS — I272 Pulmonary hypertension, unspecified: Secondary | ICD-10-CM

## 2020-11-17 NOTE — Progress Notes (Signed)
Daily Session Note  Patient Details  Name: Aminat Shelburne MRN: [id number] Date of Birth: 1945-11-14 Referring Provider:   [name] Pulmonary Rehab from 07/14/2020 in [facility] Cardiac and Pulmonary Rehab  Referring Provider [name]       Encounter Date: 11/17/2020  Check In:  Session Check In - 11/17/20 1109       Check-In   Supervising physician immediately available to respond to emergencies See telemetry face sheet for immediately available ER MD    Location [facility]-Card[facility] & Pulmonary Rehab    Staff Present Birdie Sons, MPA, RN;Laureen Owens Shark, BS, RRT, CPFT;Joseph Hood RCP,RRT,BSRT;Melissa Caiola RDN, LDN    Virtual Visit No    Medication changes reported     No    Fall or balance concerns reported    No    Warm-up and Cool-down Performed on first and last piece of equipment    Resistance Training Performed Yes    VAD Patient? No    PAD/SET Patient? No      Pain Assessment   Currently in Pain? No/denies                Social History   Tobacco Use  Smoking Status Never  Smokeless Tobacco Never    Goals Met:  Independence with exercise equipment Exercise tolerated well No report of cardiac concerns or symptoms Strength training completed today  Goals Unmet:  Not Applicable  Comments: Pt able to follow exercise prescription today without complaint.  Will continue to monitor for progression.    Dr. Emily Filbert is Medical Director for [place].  Dr. Ottie Glazier is Medical Director for [facility] Pulmonary Rehabilitation.

## 2020-11-18 ENCOUNTER — Encounter: Payer: Self-pay | Admitting: *Deleted

## 2020-11-18 DIAGNOSIS — I272 Pulmonary hypertension, unspecified: Secondary | ICD-10-CM

## 2020-11-18 NOTE — Progress Notes (Signed)
Pulmonary Individual Treatment Plan  Patient Details  Name: Jamie Murray MRN: [id number] Date of Birth: 1945/09/15 Referring Provider:   [name] Pulmonary Rehab from 07/14/2020 in [facility] Cardiac and Pulmonary Rehab  Referring Provider [name]       Initial Encounter Date:  Flowsheet Row Pulmonary Rehab from 07/14/2020 in [facility] Cardiac and Pulmonary Rehab  Date 07/14/20       Visit Diagnosis: Pulmonary hypertension ([place])  Patient's Home Medications on Admission:  Current Outpatient Medications:    ambrisentan (LETAIRIS) 10 MG tablet, Take 1 tablet (10 mg total) by mouth daily., Disp: 30 tablet, Rfl: 11   amLODipine (NORVASC) 10 MG tablet, Take 1 tablet (10 mg total) by mouth daily., Disp: 90 tablet, Rfl: 0   aspirin 81 MG tablet, Take 81 mg by mouth daily., Disp: , Rfl:    clonazePAM (KLONOPIN) 1 MG tablet, Take 1 mg by mouth daily., Disp: , Rfl:    dapagliflozin propanediol (FARXIGA) 10 MG TABS tablet, Take 1 tablet (10 mg total) by mouth daily before breakfast., Disp: 90 tablet, Rfl: 3   escitalopram (LEXAPRO) 10 MG tablet, Take 10 mg by mouth daily., Disp: , Rfl:    esomeprazole (NEXIUM) 40 MG capsule, Take 40 mg by mouth daily at 12 noon., Disp: , Rfl:    Fe Fum-FA-B Cmp-C-Zn-Mg-Mn-Cu (HEMOCYTE PLUS) 106-1 MG CAPS, Take 1 capsule by mouth daily., Disp: , Rfl: 6   furosemide (LASIX) 20 MG tablet, Take 1 tablet (20 mg total) by mouth every other day., Disp: 30 tablet, Rfl: 6   hydrALAZINE (APRESOLINE) 50 MG tablet, Take 1 tablet (50 mg total) by mouth every 8 (eight) hours., Disp: 90 tablet, Rfl: 0   insulin aspart (NOVOLOG) 100 UNIT/ML FlexPen, Inject 4 Units into the skin 3 (three) times daily with meals. (Patient taking differently: Inject 4 Units into the skin 3 (three) times daily with meals. Only for BS greater than 200), Disp: 15 mL, Rfl: 0   insulin degludec (TRESIBA) 200 UNIT/ML FlexTouch Pen, Inject 26 Units into the skin at bedtime., Disp: , Rfl:    levothyroxine  (SYNTHROID) 75 MCG tablet, Take 75 mcg by mouth daily before breakfast., Disp: , Rfl:    loratadine (CLARITIN) 10 MG tablet, Take 10 mg by mouth daily. , Disp: , Rfl:    meclizine (ANTIVERT) 25 MG tablet, Take 25 mg by mouth 3 (three) times daily as needed for dizziness. , Disp: , Rfl:    metoprolol succinate (TOPROL-XL) 50 MG 24 hr tablet, Take 50 mg by mouth daily. Take with or immediately following a meal., Disp: , Rfl:    Multiple Vitamins-Minerals (MULTI ADULT GUMMIES) CHEW, Chew by mouth daily., Disp: , Rfl:    potassium chloride (KLOR-CON) 10 MEQ tablet, Take 1 tablet (10 mEq total) by mouth every other day., Disp: 15 tablet, Rfl: 6   rosuvastatin (CRESTOR) 10 MG tablet, Take 10 mg by mouth daily., Disp: , Rfl:    Selexipag (UPTRAVI) 200 MCG TABS, Take 8 tablets (1,600 mcg total) by mouth in the morning and at bedtime. As directed by clinic, Disp: 60 tablet, Rfl: 11   tadalafil, PAH, (ADCIRCA) 20 MG tablet, Take 2 tablets (40 mg total) by mouth daily., Disp: 60 tablet, Rfl: 11  Past Medical History: Past Medical History:  Diagnosis Date   Allergy    Anemia    Anxiety    COPD (chronic obstructive pulmonary disease) (HCC)    Depression    Diabetes (HCC)    GERD (gastroesophageal reflux  disease)    Hyperlipidemia    Hypertension    Insomnia    Migraines    Osteoporosis    [facility] spotted fever    Transient cerebral ischemia    Vertigo    every 2-3 months    Tobacco Use: Social History   Tobacco Use  Smoking Status Never  Smokeless Tobacco Never    Labs: Recent Review Flowsheet Data     Labs for ITP Cardiac and Pulmonary Rehab Latest Ref Rng & Units 03/07/2016 06/07/2016 11/29/2016 01/09/2020 07/03/2020   Cholestrol 0 - 200 mg/dL 145 213(H) 95(L) 128 121   LDLCALC 0 - 99 mg/dL 77 143(H) 45 68 55   HDL >40 mg/dL 43 37(L) 29(L) 24(L) 43   Trlycerides <150 mg/dL 123 164(H) 105 178(H) 115   Hemoglobin A1c 4.8 - 5.6 % 7.8(H) 8.5(H) 7.1(H) 10.9(H) -        Pulmonary  Assessment Scores:  Pulmonary Assessment Scores     Row Name 07/14/20 1440         ADL UCSD   SOB Score total 33     Rest 3     Walk 3     Stairs 5     Bath 0     Dress 0     Shop 0           CAT Score     CAT Score 19           mMRC Score     mMRC Score 2             UCSD: Self-administered rating of dyspnea associated with activities of daily living (ADLs) 6-point scale (0 = "not at all" to 5 = "maximal or unable to do because of breathlessness")  Scoring Scores range from 0 to 120.  Minimally important difference is 5 units  CAT: CAT can identify the health impairment of COPD patients and is better correlated with disease progression.  CAT has a scoring range of zero to 40. The CAT score is classified into four groups of low (less than 10), medium (10 - 20), high (21-30) and very high (31-40) based on the impact level of disease on health status. A CAT score over 10 suggests significant symptoms.  A worsening CAT score could be explained by an exacerbation, poor medication adherence, poor inhaler technique, or progression of COPD or comorbid conditions.  CAT MCID is 2 points  mMRC: mMRC (Modified Medical Research Council) Dyspnea Scale is used to assess the degree of baseline functional disability in patients of respiratory disease due to dyspnea. No minimal important difference is established. A decrease in score of 1 point or greater is considered a positive change.   Pulmonary Function Assessment:   Exercise Target Goals: Exercise Program Goal: Individual exercise prescription set using results from initial 6 min walk test and THRR while considering  patient's activity barriers and safety.   Exercise Prescription Goal: Initial exercise prescription builds to 30-45 minutes a day of aerobic activity, 2-3 days per week.  Home exercise guidelines will be given to patient during program as part of exercise prescription that the participant will  acknowledge.  Education: Aerobic Exercise: - Group verbal and visual presentation on the components of exercise prescription. Introduces F.I.T.T principle from ACSM for exercise prescriptions.  Reviews F.I.T.T. principles of aerobic exercise including progression. Written material given at graduation. Flowsheet Row Pulmonary Rehab from 11/12/2020 in [facility] Cardiac and Pulmonary Rehab  Date 09/24/20  Educator Craig Staggers  Instruction Review Code 1- Verbalizes Understanding       Education: Resistance Exercise: - Group verbal and visual presentation on the components of exercise prescription. Introduces F.I.T.T principle from ACSM for exercise prescriptions  Reviews F.I.T.T. principles of resistance exercise including progression. Written material given at graduation. Flowsheet Row Pulmonary Rehab from 11/12/2020 in [facility] Cardiac and Pulmonary Rehab  Date 07/30/20  Educator [facility]  Instruction Review Code 1- Verbalizes Understanding        Education: Exercise & Equipment Safety: - Individual verbal instruction and demonstration of equipment use and safety with use of the equipment. Flowsheet Row Pulmonary Rehab from 11/12/2020 in [facility] Cardiac and Pulmonary Rehab  Date 07/14/20  Educator AS  Instruction Review Code 1- Verbalizes Understanding       Education: Exercise Physiology & General Exercise Guidelines: - Group verbal and written instruction with models to review the exercise physiology of the cardiovascular system and associated critical values. Provides general exercise guidelines with specific guidelines to those with heart or lung disease.  Flowsheet Row Pulmonary Rehab from 11/12/2020 in [facility] Cardiac and Pulmonary Rehab  Date 09/17/20  Educator [facility]  Instruction Review Code 1- Verbalizes Understanding       Education: Flexibility, Balance, Mind/Body Relaxation: - Group verbal and visual presentation with interactive activity on the components of exercise prescription. Introduces F.I.T.T  principle from ACSM for exercise prescriptions. Reviews F.I.T.T. principles of flexibility and balance exercise training including progression. Also discusses the mind body connection.  Reviews various relaxation techniques to help reduce and manage stress (i.e. Deep breathing, progressive muscle relaxation, and visualization). Balance handout provided to take home. Written material given at graduation. Flowsheet Row Pulmonary Rehab from 11/12/2020 in [facility] Cardiac and Pulmonary Rehab  Date 10/08/20  Educator AS  Instruction Review Code 1- Verbalizes Understanding       Activity Barriers & Risk Stratification:  Activity Barriers & Cardiac Risk Stratification - 07/06/20 1534       Activity Barriers & Cardiac Risk Stratification   Activity Barriers Back Problems;Muscular Weakness;Assistive Device   cane            6 Minute Walk:  6 Minute Walk     Row Name 07/14/20 1427         6 Minute Walk   Distance 780 feet     Walk Time 6 minutes     # of Rest Breaks 0     MPH 1.47     METS 1.9     RPE 11     Perceived Dyspnea  1     VO2 Peak 6.63     Symptoms No     Resting HR 74 bpm     Resting BP 120/60     Resting Oxygen Saturation  94 %     Exercise Oxygen Saturation  during 6 min walk 89 %     Max Ex. HR 92 bpm     Max Ex. BP 134/74     2 Minute Post BP 108/64           Interval HR     1 Minute HR 80     2 Minute HR 85     3 Minute HR 86     4 Minute HR 89     5 Minute HR 92     6 Minute HR 89     2 Minute Post HR 83     Interval Heart Rate? Yes  Interval Oxygen     Interval Oxygen? Yes     Baseline Oxygen Saturation % 94 %     1 Minute Oxygen Saturation % 98 %     1 Minute Liters of Oxygen 0 L     2 Minute Oxygen Saturation % 96 %     2 Minute Liters of Oxygen 0 L     3 Minute Oxygen Saturation % 93 %     3 Minute Liters of Oxygen 0 L     4 Minute Oxygen Saturation % 95 %     4 Minute Liters of Oxygen 0 L     5 Minute Oxygen Saturation % 89 %      5 Minute Liters of Oxygen 0 L     6 Minute Oxygen Saturation % 91 %     6 Minute Liters of Oxygen 0 L     2 Minute Post Oxygen Saturation % 96 %     2 Minute Post Liters of Oxygen 0 L            Oxygen Initial Assessment:  Oxygen Initial Assessment - 07/06/20 1604       Home Oxygen   Home Oxygen Device None    Sleep Oxygen Prescription CPAP    Home Exercise Oxygen Prescription None    Home Resting Oxygen Prescription None    Compliance with Home Oxygen Use No   issues with CPAP compliance due to congestion for last 2 months     Initial 6 min Walk   Oxygen Used None      Program Oxygen Prescription   Program Oxygen Prescription None      Intervention   Short Term Goals To learn and understand importance of monitoring SPO2 with pulse oximeter and demonstrate accurate use of the pulse oximeter.;To learn and understand importance of maintaining oxygen saturations>88%;To learn and demonstrate proper pursed lip breathing techniques or other breathing techniques. ;To learn and demonstrate proper use of respiratory medications    Long  Term Goals Verbalizes importance of monitoring SPO2 with pulse oximeter and return demonstration;Compliance with respiratory medication;Exhibits proper breathing techniques, such as pursed lip breathing or other method taught during program session;Maintenance of O2 saturations>88%             Oxygen Re-Evaluation:  Oxygen Re-Evaluation     Row Name 07/21/20 1125 08/11/20 1113 09/10/20 1102 10/06/20 1130 11/03/20 1128     Program Oxygen Prescription   Program Oxygen Prescription _0      Home Oxygen   Home Oxygen Device _1    Sleep Oxygen Prescription _2    Liters per minute 0 -- -- 0 0   Home Exercise Oxygen Prescription _3    Home Resting Oxygen Prescription None None None None --   Compliance with Home Oxygen Use _4      Goals/Expected  Outcomes   Short Term Goals To learn and demonstrate proper pursed lip breathing techniques or other breathing techniques.  To learn and demonstrate proper pursed lip breathing techniques or other breathing techniques. ;To learn and understand importance of maintaining oxygen saturations>88%;To learn and understand importance of monitoring SPO2 with pulse oximeter and demonstrate accurate use of the pulse oximeter. To learn and understand importance of maintaining oxygen saturations>88% Other To learn and understand importance of monitoring SPO2 with pulse oximeter and demonstrate accurate use of the pulse oximeter.;To learn and understand importance  of maintaining oxygen saturations>88%;To learn and demonstrate proper pursed lip breathing techniques or other breathing techniques. ;To learn and demonstrate proper use of respiratory medications   Long  Term Goals -- Verbalizes importance of monitoring SPO2 with pulse oximeter and return demonstration;Maintenance of O2 saturations>88%;Exhibits proper breathing techniques, such as pursed lip breathing or other method taught during program session;Compliance with respiratory medication Maintenance of O2 saturations>88% Other Verbalizes importance of monitoring SPO2 with pulse oximeter and return demonstration;Exhibits proper breathing techniques, such as pursed lip breathing or other method taught during program session;Maintenance of O2 saturations>88%;Compliance with respiratory medication   Comments Reviewed PLB technique with pt.  Talked about how it works and it's importance in maintaining their exercise saturations. Myrtha is going to purchase a pulse oximeter to check her oxygen at home. Informed her the importance of having a pulse oximeter and keeping her oxygen above 88 percent. She does not have a pulse oximeter to check her oxygen saturation at home. Informed her where to get one and explained why it is important to have one. Reviewed that oxygen  saturations should be 88 percent and above. Patient has a pulse oximeter at home to check his oxygen. Briannie does not have any questions or concerns with her oxygen or shortness of breath. She states that she has not been getting short of breath as much. She has been informed of breathing techniques. Xianna is faithful to her CPAP and sleeps well with it.  She is breathing better and using her PLB.  She is trying to get better and keeps an eye on her oxygen levels.  Today, she did not get SOB while walking on the track.   Goals/Expected Outcomes Short: Become more profiecient at using PLB.   Long: Become independent at using PLB. Short: purchase a pulse oximeter. Long: maintain oxygen saturations of 88 percent and above independently Short: monitor oxygen at home with exertion. Long: maintain oxygen saturations above 88 percent independently. Short: continue to use breathing techniques. Long: maintain breathing techniques independently post [employer] --            Oxygen Discharge (Final Oxygen Re-Evaluation):  Oxygen Re-Evaluation - 11/03/20 1128       Program Oxygen Prescription   Program Oxygen Prescription None      Home Oxygen   Home Oxygen Device None    Sleep Oxygen Prescription CPAP    Liters per minute 0    Home Exercise Oxygen Prescription None    Compliance with Home Oxygen Use Yes      Goals/Expected Outcomes   Short Term Goals To learn and understand importance of monitoring SPO2 with pulse oximeter and demonstrate accurate use of the pulse oximeter.;To learn and understand importance of maintaining oxygen saturations>88%;To learn and demonstrate proper pursed lip breathing techniques or other breathing techniques. ;To learn and demonstrate proper use of respiratory medications    Long  Term Goals Verbalizes importance of monitoring SPO2 with pulse oximeter and return demonstration;Exhibits proper breathing techniques, such as pursed lip breathing or other method taught during  program session;Maintenance of O2 saturations>88%;Compliance with respiratory medication    Comments Shadoe is faithful to her CPAP and sleeps well with it.  She is breathing better and using her PLB.  She is trying to get better and keeps an eye on her oxygen levels.  Today, she did not get SOB while walking on the track.             Initial Exercise Prescription:  Initial Exercise Prescription -  07/14/20 1400       Date of Initial Exercise RX and Referring Provider   Date 07/14/20    Referring Provider [name]      Treadmill   MPH 1.4    Grade 0    Minutes 15    METs 2      Recumbant Bike   Level 1    RPM 60    Minutes 15    METs 2      NuStep   Level 1    SPM 80    Minutes 15    METs 2      Recumbant Elliptical   Level 1    RPM 50    Minutes 15    METs 2      REL-XR   Level 1    Speed 50    Minutes 15    METs 2      Prescription Details   Frequency (times per week) 3    Duration Progress to 30 minutes of continuous aerobic without signs/symptoms of physical distress      Intensity   THRR 40-80% of Max Heartrate 103-132    Ratings of Perceived Exertion 11-13    Perceived Dyspnea 0-4      Progression   Progression Continue to progress workloads to maintain intensity without signs/symptoms of physical distress.      Resistance Training   Training Prescription Yes    Weight 2 lb    Reps 10-15             Perform Capillary Blood Glucose checks as needed.  Exercise Prescription Changes:   Exercise Prescription Changes     Row Name 07/14/20 1400 07/29/20 1000 08/11/20 1400 08/25/20 1500 09/09/20 1300     Response to Exercise   Blood Pressure (Admit) 120/60 110/60 118/70 116/78 114/58   Blood Pressure (Exercise) 134/74 122/60 124/60 124/68 128/64   Blood Pressure (Exit) 108/64 108/62 118/64 110/50 110/52   Heart Rate (Admit) 74 bpm 69 bpm 72 bpm 67 bpm 67 bpm   Heart Rate (Exercise) 92 bpm 75 bpm 78 bpm 80 bpm 78 bpm   Heart Rate (Exit) 83  bpm 64 bpm 69 bpm 66 bpm 69 bpm   Oxygen Saturation (Admit) 94 % 95 % 95 % 98 % 98 %   Oxygen Saturation (Exercise) 89 % 87 % 93 % 97 % 94 %   Oxygen Saturation (Exit) 96 % 93 % 94 % 92 % 98 %   Rating of Perceived Exertion (Exercise) _0 --   Perceived Dyspnea (Exercise) 1 1 0 4 1   Symptoms none SOB -- SOB on treadmill --   Comments -- second full day of exercise -- -- --   Duration -- Continue with 30 min of aerobic exercise without signs/symptoms of physical distress. Continue with 30 min of aerobic exercise without signs/symptoms of physical distress. Continue with 30 min of aerobic exercise without signs/symptoms of physical distress. Continue with 30 min of aerobic exercise without signs/symptoms of physical distress.   Intensity -- THRR unchanged THRR unchanged THRR unchanged THRR unchanged     Progression   Progression -- Continue to progress workloads to maintain intensity without signs/symptoms of physical distress. Continue to progress workloads to maintain intensity without signs/symptoms of physical distress. Continue to progress workloads to maintain intensity without signs/symptoms of physical distress. Continue to progress workloads to maintain intensity without signs/symptoms of physical distress.   Average METs -- 1.86 2.47 2.2  2.1     Resistance Training   Training Prescription -- Yes Yes Yes Yes   Weight -- 2 lb 2 lb 2 lb 2 lb   Reps -- 10-15 10-15 10-15 10-15     Interval Training   Interval Training -- No No No --     Treadmill   MPH -- 1.4 -- 1.4 1.4   Grade -- 0 -- 0 0   Minutes -- 15 -- 15 15   METs -- 2.07 -- 2.07 2.07     Recumbant Bike   Level -- -- 1 1 --   RPM -- -- 60 -- --   Watts -- -- -- 15 --   Minutes -- -- 15 15 --   METs -- -- 3.04 2.54 --     NuStep   Level -- -- _0 Minutes -- -- _1 METs -- -- 1.9 1.9 1.8     Recumbant Elliptical   Level -- 1 -- -- --   Minutes -- 15 -- -- --   METs -- 1.1 -- -- --     REL-XR    Level -- 1 -- -- --   Minutes -- 30 -- -- --   METs -- 2.4 -- -- --     Home Exercise Plan   Plans to continue exercise at -- -- -- Home (comment)  walking, elliptical Home (comment)  walking, elliptical   Frequency -- -- -- Add 2 additional days to program exercise sessions. Add 2 additional days to program exercise sessions.   Initial Home Exercises Provided -- -- -- 08/18/20 08/18/20    Row Name 09/23/20 1700 10/21/20 1500 11/05/20 1100         Response to Exercise   Blood Pressure (Admit) 118/66 120/60 136/60     Blood Pressure (Exercise) 128/64 118/58 138/64     Blood Pressure (Exit) 102/60 120/68 104/62     Heart Rate (Admit) 63 bpm 55 bpm 65 bpm     Heart Rate (Exercise) 70 bpm 67 bpm 84 bpm     Heart Rate (Exit) 64 bpm 65 bpm 63 bpm     Oxygen Saturation (Admit) 99 % 98 % 96 %     Oxygen Saturation (Exercise) 91 % 99 % 96 %     Oxygen Saturation (Exit) 99 % 96 % 97 %     Rating of Perceived Exertion (Exercise) _2 Perceived Dyspnea (Exercise) 1 0 1     Symptoms -- none none     Duration Continue with 30 min of aerobic exercise without signs/symptoms of physical distress. Continue with 30 min of aerobic exercise without signs/symptoms of physical distress. Continue with 30 min of aerobic exercise without signs/symptoms of physical distress.     Intensity THRR unchanged THRR unchanged THRR unchanged           Progression       Progression Continue to progress workloads to maintain intensity without signs/symptoms of physical distress. Continue to progress workloads to maintain intensity without signs/symptoms of physical distress. Continue to progress workloads to maintain intensity without signs/symptoms of physical distress.     Average METs 2.07 1.85 1.9           Resistance Training       Training Prescription Yes Yes Yes     Weight 2 lb 2 lb 2 lb     Reps 10-15 10-15 10-15  Interval Training       Interval Training No No No            Treadmill       MPH 1.4 1 --     Grade -- 0 --     Minutes 15 15 --     METs 2.07 1.77 --           NuStep       Level -- 2 3     Minutes -- 15 15     METs -- 2 1.9           REL-XR       Level 1 -- --     Minutes 15 -- --           Track       Laps -- -- 15     Minutes -- -- 15           Home Exercise Plan       Plans to continue exercise at Home (comment)  walking, elliptical Home (comment)  walking, elliptical Home (comment)  walking, elliptical     Frequency Add 2 additional days to program exercise sessions. Add 2 additional days to program exercise sessions. Add 2 additional days to program exercise sessions.     Initial Home Exercises Provided 08/18/20 08/18/20 08/18/20             Exercise Comments:   Exercise Comments     Row Name 07/21/20 1123           Exercise Comments First full day of exercise!  Patient was oriented to gym and equipment including functions, settings, policies, and procedures.  Patient's individual exercise prescription and treatment plan were reviewed.  All starting workloads were established based on the results of the 6 minute walk test done at initial orientation visit.  The plan for exercise progression was also introduced and progression will be customized based on patient's performance and goals.                Exercise Goals and Review:   Exercise Goals     Row Name 07/14/20 1447             Exercise Goals   Increase Physical Activity Yes       Intervention Provide advice, education, support and counseling about physical activity/exercise needs.;Develop an individualized exercise prescription for aerobic and resistive training based on initial evaluation findings, risk stratification, comorbidities and participant's personal goals.       Expected Outcomes Short Term: Attend rehab on a regular basis to increase amount of physical activity.;Long Term: Add in home exercise to make exercise part of routine and to  increase amount of physical activity.;Long Term: Exercising regularly at least 3-5 days a week.       Increase Strength and Stamina Yes       Intervention Provide advice, education, support and counseling about physical activity/exercise needs.;Develop an individualized exercise prescription for aerobic and resistive training based on initial evaluation findings, risk stratification, comorbidities and participant's personal goals.       Expected Outcomes Short Term: Increase workloads from initial exercise prescription for resistance, speed, and METs.;Short Term: Perform resistance training exercises routinely during rehab and add in resistance training at home;Long Term: Improve cardiorespiratory fitness, muscular endurance and strength as measured by increased METs and functional capacity (6MWT)       Able to understand and use rate of perceived exertion (RPE) scale Yes  Intervention Provide education and explanation on how to use RPE scale       Expected Outcomes Short Term: Able to use RPE daily in rehab to express subjective intensity level;Long Term:  Able to use RPE to guide intensity level when exercising independently       Able to understand and use Dyspnea scale Yes       Intervention Provide education and explanation on how to use Dyspnea scale       Expected Outcomes Short Term: Able to use Dyspnea scale daily in rehab to express subjective sense of shortness of breath during exertion;Long Term: Able to use Dyspnea scale to guide intensity level when exercising independently       Knowledge and understanding of Target Heart Rate Range (THRR) Yes       Intervention Provide education and explanation of THRR including how the numbers were predicted and where they are located for reference       Expected Outcomes Short Term: Able to state/look up THRR;Long Term: Able to use THRR to govern intensity when exercising independently;Short Term: Able to use daily as guideline for intensity in  rehab       Able to check pulse independently Yes       Intervention Provide education and demonstration on how to check pulse in carotid and radial arteries.;Review the importance of being able to check your own pulse for safety during independent exercise       Expected Outcomes Short Term: Able to explain why pulse checking is important during independent exercise;Long Term: Able to check pulse independently and accurately       Understanding of Exercise Prescription Yes       Intervention Provide education, explanation, and written materials on patient's individual exercise prescription       Expected Outcomes Short Term: Able to explain program exercise prescription;Long Term: Able to explain home exercise prescription to exercise independently                Exercise Goals Re-Evaluation :  Exercise Goals Re-Evaluation     Row Name 07/21/20 1124 07/29/20 1004 08/11/20 1116 08/18/20 1134 08/25/20 1535     Exercise Goal Re-Evaluation   Exercise Goals Review Able to understand and use rate of perceived exertion (RPE) scale;Able to understand and use Dyspnea scale;Knowledge and understanding of Target Heart Rate Range (THRR);Understanding of Exercise Prescription Increase Physical Activity;Increase Strength and Stamina;Understanding of Exercise Prescription Increase Physical Activity;Increase Strength and Stamina;Understanding of Exercise Prescription Increase Physical Activity;Increase Strength and Stamina Increase Physical Activity;Increase Strength and Stamina;Understanding of Exercise Prescription   Comments Reviewed RPE and dyspnea scales, THR and program prescription with pt today.  Pt voiced understanding and was given a copy of goals to take home. Graylyn is off to a good start in rehab.  She can completed her first two full days of exercise thus far.  We will continue to montior her progress. Bernita uses a recumbant eliptical at home twice a week. She is going to try to go more than 15   minutes on her machine at home. She still needs home exercise and is increases her levels in rehab. Pt able to follow exercise prescription today without complaint.  Will continue to monitor for progression.  Reviewed home exercise with pt today.  Pt plans to use elliptical and walk for exercise.  Reviewed THR, pulse, RPE, sign and symptoms, pulse oximetery and when to call 911 or MD.  Also discussed weather considerations and indoor options.  Pt voiced understanding. Windi is doing well in rehab.  She has missed her last two session due to GI issues and dental surgery.  She is up to 15 watts on the bike.  We will continue to montior her progress.   Expected Outcomes Short: Use RPE daily to regulate intensity. Long: Follow program prescription in THR. Short: Continue to attend rehab regularly Long: Continue to follow program prescription. Short: increase honme exercise. Long: maintain exercise at home independently Short: monitor HR and O2 when exercising at home Long: maintain exercise on her own Short: Improved attendance again Long: Continue to improve stamina    Row Name 09/09/20 1401 09/23/20 1749 10/06/20 1132 10/21/20 1513 11/03/20 1123     Exercise Goal Re-Evaluation   Exercise Goals Review Increase Physical Activity;Increase Strength and Stamina -- Increase Strength and Stamina Increase Strength and Stamina;Increase Physical Activity;Understanding of Exercise Prescription Increase Strength and Stamina;Increase Physical Activity;Understanding of Exercise Prescription   Comments Genevra continues to attend consistently.  Oxygen levels stay in the mid 90s during exercise.  Staff will encourage increasing levels/adding incline. Salina is ready to increase levels for exercise.  Staff will review THR and how to increase safely. Tonette has been able to improve her strength and stamina in rehab. She states that she has not been able to do 80 steps per minute on the NuStep but has been able to. Constancia would like  to get back to walking on the track.  She says she enjoys walking.  We will continue to encourage her to increase spm and continue to monitor her progress. Envi is doing well in rehab.  Today she was able to walk 6 laps without using her cane.  Yesterday she was able to walk 1/4 mile at the park without her cane.  She is feeling better and has more stamina. She is planning to go to the [place] to get set up for exercise for after graduation.  She is nearing graduation already!   Expected Outcomes Short: increase levels Long: increase MET level Short:increase levels on machines Long:  improve overall stamina Short: workout post [facility]. Long: maintain working out independently. Short: Continue to walk more at home Long: Continue to improve stamina Short: Conitnue to walk at home Long: Continue to improve stamina.            Discharge Exercise Prescription (Final Exercise Prescription Changes):  Exercise Prescription Changes - 11/05/20 1100       Response to Exercise   Blood Pressure (Admit) 136/60    Blood Pressure (Exercise) 138/64    Blood Pressure (Exit) 104/62    Heart Rate (Admit) 65 bpm    Heart Rate (Exercise) 84 bpm    Heart Rate (Exit) 63 bpm    Oxygen Saturation (Admit) 96 %    Oxygen Saturation (Exercise) 96 %    Oxygen Saturation (Exit) 97 %    Rating of Perceived Exertion (Exercise) 12    Perceived Dyspnea (Exercise) 1    Symptoms none    Duration Continue with 30 min of aerobic exercise without signs/symptoms of physical distress.    Intensity THRR unchanged      Progression   Progression Continue to progress workloads to maintain intensity without signs/symptoms of physical distress.    Average METs 1.9      Resistance Training   Training Prescription Yes    Weight 2 lb    Reps 10-15      Interval Training   Interval Training No  NuStep   Level 3    Minutes 15    METs 1.9      Track   Laps 15    Minutes 15      Home Exercise Plan   Plans  to continue exercise at Home (comment)   walking, elliptical   Frequency Add 2 additional days to program exercise sessions.    Initial Home Exercises Provided 08/18/20             Nutrition:  Target Goals: Understanding of nutrition guidelines, daily intake of sodium <1549m, cholesterol <2082m calories 30% from fat and 7% or less from saturated fats, daily to have 5 or more servings of fruits and vegetables.  Education: All About Nutrition: -Group instruction provided by verbal, written material, interactive activities, discussions, models, and posters to present general guidelines for heart healthy nutrition including fat, fiber, MyPlate, the role of sodium in heart healthy nutrition, utilization of the nutrition label, and utilization of this knowledge for meal planning. Follow up email sent as well. Written material given at graduation. [facility]  Date 10/15/20  [facility]- Verbalizes Understanding       Biometrics:  Pre Biometrics - 07/14/20 1450       Pre Biometrics   Height 5' 7.5" (1.715 m)    Weight 165 lb 6.4 oz (75 kg)    BMI (Calculated) 25.51    Single Leg Stand 4.65 seconds              Nutrition Therapy Plan and Nutrition Goals:  Nutrition Therapy & Goals - 08/05/20 1533       Nutrition Therapy   Diet Heart healthy, low Na, diabetes friendly    Drug/Food Interactions Statins/Certain Fruits    Protein (specify units) 60g    Fiber 25 grams    Whole Grain Foods 3 servings    Saturated Fats 12 max. grams    Fruits and Vegetables 8 servings/day    Sodium 1.5 grams      Personal Nutrition Goals   Nutrition Goal ST: practice counting carbohydrates LT: A1C back to 7    Comments A1C is 8. She is on insulin. B: whole wheat toast (some regular jelly) with tuKuwaitacon and egg - coffee with stevia L: potpie with madarin oranges today D: tossed salad with  cheese and processed ham yesterday, today she will have fish (broiled) with sweet potato and slaw. Drinks: water, juice (lemonade and crangrape - 1/2 glass) - with lunch. AM 70, now 128 before eating today - she is normally around ~120. Leilany reported being very confused with macronutrients as well as diabetes friendly eating. Discussed all macronutrients as well as diabetes friendly eating  - patient voiced understanding, will review at goal time and encouraged attending education. She is limiting her sugar sweetened beverages and is giving up soda for LeMalena Edman will use juice as her emergency sugar instead.      Intervention Plan   Intervention Prescribe, educate and counsel regarding individualized specific dietary modifications aiming towards targeted core components such as weight, hypertension, lipid management, diabetes, heart failure and other comorbidities.;Nutrition handout(s) given to patient.    Expected Outcomes Short Term Goal: Understand basic principles of dietary content, such as calories, fat, sodium, cholesterol and nutrients.;Short Term Goal: A plan has been developed with personal nutrition goals set during dietitian appointment.;Long Term Goal: Adherence to prescribed nutrition plan.  Nutrition Assessments:  MEDIFICTS Score Key: ?70 Need to make dietary changes  40-70 Heart Healthy Diet ? 40 Therapeutic Level Cholesterol Diet  Flowsheet Row Pulmonary Rehab from [facility]/14/2020 [facility]  Picture Your Plate Total Score on Admission 56      Picture Your Plate Scores: <82 Unhealthy dietary pattern with much room for improvement. 41-50 Dietary pattern unlikely to meet recommendations for good health and room for improvement. 51-60 More healthful dietary pattern, with some room for improvement.  >60 Healthy dietary pattern, although there may be some specific behaviors that could be improved.   Nutrition Goals Re-Evaluation:  Nutrition Goals  Re-Evaluation     [place] Name 08/18/20 8003 09/10/20 1107 10/06/20 1124 10/08/20 1106 11/03/20 1126     Goals   Current Weight -- 165 lb (74.8 kg) 163 lb (73.9 kg) -- --   Nutrition Goal -- Keep blood sugar in check. eat a diet that pertains to her needs. ST: practice reflux MNT LT: reduce reflux symptoms ST: practice reflux MNT LT: reduce reflux symptoms   Comment Solstice reports having problems with reflux right now and is taking tums to combat it. It occurs mostly at night and wakes her up - she eats dinner at 6pm and feels reflux at 12am. Today she had reflux when exercising at 11am - she ate oatmeal at 9am. Suggested that reflux can be cause by a number of things, however, this may be a hunger response - suggested having a small snack before rehab and after dinner to see if this helps at all. She reports she has not chnaged what she was eating - this could also be a stress response. Carrye states that her reflex has been 98 percent better. She has been taking medication to help her reflux. She is checking her sugars at home and has been having lower blood sugar checks. Gwenyth feels like she is eating well and has no questions about her nutrition at this time. Lya reports continuing issues with reflux, she feels the medication isn't helping much. Discussed reflux MNT: avoiding/limiting caffiene, chocolate, spicy foods, acidic foods, high fat foods, high amounts of added sugar and eating small frequent meals to avoid over-filling stomach. Marlane continues to have some issures with reflux on her new medicaiton.  She is staying away from spicy food and using Splenda instead of sugar.  She is trying to eat smaller meals.  They last epsiode she thinks she ate too late in the evening.   Expected Outcome -- Short: continue to eat food that help keep Blood sugar in check. Long: maintain a heart healthy diet. Short: continue current diet. Long: maintain diet independently. ST: practice reflux MNT LT: reduce reflux  symptoms ST: practice reflux MNT LT: reduce reflux symptoms            Nutrition Goals Discharge (Final Nutrition Goals Re-Evaluation):  Nutrition Goals Re-Evaluation - 11/03/20 1126       Goals   Nutrition Goal ST: practice reflux MNT LT: reduce reflux symptoms    Comment Kennie continues to have some issures with reflux on her new medicaiton.  She is staying away from spicy food and using Splenda instead of sugar.  She is trying to eat smaller meals.  They last epsiode she thinks she ate too late in the evening.    Expected Outcome ST: practice reflux MNT LT: reduce reflux symptoms             Psychosocial: Target Goals: Acknowledge presence  or absence of significant depression and/or stress, maximize coping skills, provide positive support system. Participant is able to verbalize types and ability to use techniques and skills needed for reducing stress and depression.   Education: Stress, Anxiety, and Depression - Group verbal and visual presentation to define topics covered.  Reviews how body is impacted by stress, anxiety, and depression.  Also discusses healthy ways to reduce stress and to treat/manage anxiety and depression.  Written material given at graduation. Flowsheet Row Pulmonary Rehab from 11/12/2020 in [facility] and Pulmonary Rehab  Date 11/12/20  Educator [facility]  Instruction Review Code 1- [employer] Understanding       Education: Sleep Hygiene -Provides group verbal and written instruction about how sleep can affect your health.  Define sleep hygiene, discuss sleep cycles and impact of sleep habits. Review good sleep hygiene tips.    Initial Review & Psychosocial Screening:  Initial Psych Review & Screening - 07/06/20 1539       Initial Review   Current issues with Current Sleep Concerns;Current Depression;History of Depression      Family Dynamics   Good Support System? Yes   daughter     Barriers   Psychosocial barriers to participate in program The  patient should benefit from training in stress management and relaxation.      Screening Interventions   Interventions Encouraged to exercise;Provide feedback about the scores to participant;To provide support and resources with identified psychosocial needs    Expected Outcomes Short Term goal: Utilizing psychosocial counselor, staff and physician to assist with identification of specific Stressors or current issues interfering with healing process. Setting desired goal for each stressor or current issue identified.;Long Term Goal: Stressors or current issues are controlled or eliminated.;Short Term goal: Identification and review with participant of any Quality of Life or Depression concerns found by scoring the questionnaire.;Long Term goal: The participant improves quality of Life and PHQ9 Scores as seen by post scores and/or verbalization of changes             Quality of Life Scores:  Scores of 19 and below usually indicate a poorer quality of life in these areas.  A difference of  2-3 points is a clinically meaningful difference.  A difference of 2-3 points in the total score of the Quality of Life Index has been associated with significant improvement in overall quality of life, self-image, physical symptoms, and general health in studies assessing change in quality of life.  PHQ-9: Recent Review Flowsheet Data     Depression screen [facility] 2/9 11/17/2020 11/03/2020 10/06/2020 08/11/2020 07/14/2020   Decreased Interest 1 0 0 1 1   Down, Depressed, Hopeless 0 0 0 0 1   PHQ - 2 Score 1 0 0 1 2   Altered sleeping 1 0 1 2 0   Tired, decreased energy _0 Change in appetite 0 0 2 0 0   Feeling bad or failure about yourself  0 0 0 0 0   Trouble concentrating 0 0 0 0 0   Moving slowly or fidgety/restless 0 0 0 0 0   Suicidal thoughts 0 0 0 0 0   PHQ-9 Score _1 Difficult doing work/chores Not difficult at all Not difficult at all Not difficult at all Not difficult at all Not  difficult at all      Interpretation of Total Score  Total Score Depression Severity:  1-4 = Minimal depression, 5-9 = Mild  depression, 10-14 = Moderate depression, 15-19 = Moderately severe depression, 20-27 = Severe depression   Psychosocial Evaluation and Intervention:  Psychosocial Evaluation - 07/06/20 1556       Psychosocial Evaluation & Interventions   Interventions Stress management education;Relaxation education;Encouraged to exercise with the program and follow exercise prescription    Comments Elleni is returning to Pulmonary Rehab with pulmonary HTN. She states she doesn't have stress because she "gets up and lives each day" she also stated that her feelings on depression are "who cares? we get up and do the same thing me and my husband so what difference does it make on my quality of life." She doesn't want to take any more medication even if it does help with mood. When she was in the program before, the counselor encouraged her to seek help from a therapist for help with depression but she said she could never get a hold of someone. She is seeing her primary doctor next week and said she would mention it to them. Her daughter and son are local and help with whatever she needs. COVID has prevented her from being able to exercise and get out of the house, so she thinks coming to pulmonary rehab will help get her back into a routine even though she is really only coming to please her daughter and doctor.    Expected Outcomes Short: attend pulmonary rehab for education and exercise. Long: develop positive self care habits.    Continue Psychosocial Services  Follow up required by staff             Psychosocial Re-Evaluation:  Psychosocial Re-Evaluation     [place] Name 08/11/20 1123 09/10/20 1103 10/06/20 1126 11/03/20 1129       Psychosocial Re-Evaluation   Current issues with History of Depression;Current Sleep Concerns Current Sleep Concerns;Current Stress Concerns Current  Stress Concerns;Current Sleep Concerns Current Stress Concerns;Current Sleep Concerns    Comments Reviewed patient health questionnaire (PHQ-9) with patient for follow up. Previously, patients score indicated signs/symptoms of depression.  Reviewed to see if patient is improving symptom wise while in program.  Score declined and patient states that it is because she has little energy. Sometimes she has trouble sleeping. Her husbands sister has passed away and her Nephew took it really hard. She had alot of feels and nervousness over her nephew taking it so hard. She felt like she only had three hours of sleep despite taking melatonin. She is going to speak with her doctor about being tired and not sleeping well. She thinks maybe she needs diferent medications. Reviewed patient health questionnaire (PHQ-9) with patient for follow up. Previously, patients score indicated signs/symptoms of depression.  Reviewed to see if patient is improving symptom wise while in program.  Score stayed the same and patient states that it is because she never has energy. She states that she meditates at home and it helps her mood alot. Amenda is sleeping better and feeling good overall. She is is getting out to walk more and do more each day.  Her PHQ has greatly improved!!  She attributes it to feeling better and getting out more. She is already setting up plans to keep this up after graduation.    Expected Outcomes Short: Continue to work toward an improvement in [place] scores by attending [facility] regularly. Long: Continue to improve stress and depression coping skills by talking with staff and attending [facility] regularly and work toward a positive mental state. Short: go to the doctor to  talk about medications. Long: get better sleep. Short: Continue to work toward an improvement in [place] scores by attending [facility] regularly. Long: Continue to improve stress and depression coping skills by talking with staff and attending  [facility] regularly and work toward a positive mental state. Short: COntinue to exercise for mental boost LOng: COntinue to stay activie    Interventions Encouraged to attend Pulmonary Rehabilitation for the exercise;Relaxation education Encouraged to attend Pulmonary Rehabilitation for the exercise Encouraged to attend Pulmonary Rehabilitation for the exercise Encouraged to attend Pulmonary Rehabilitation for the exercise    Continue Psychosocial Services  Follow up required by staff Follow up required by staff Follow up required by staff Follow up required by staff         Initial Review        Source of Stress Concerns -- -- -- Chronic Illness;Family            Psychosocial Discharge (Final Psychosocial Re-Evaluation):  Psychosocial Re-Evaluation - 11/03/20 1129       Psychosocial Re-Evaluation   Current issues with Current Stress Concerns;Current Sleep Concerns    Comments Mikaelyn is sleeping better and feeling good overall. She is is getting out to walk more and do more each day.  Her PHQ has greatly improved!!  She attributes it to feeling better and getting out more. She is already setting up plans to keep this up after graduation.    Expected Outcomes Short: COntinue to exercise for mental boost LOng: COntinue to stay activie    Interventions Encouraged to attend Pulmonary Rehabilitation for the exercise    Continue Psychosocial Services  Follow up required by staff      Initial Review   Source of Stress Concerns Chronic Illness;Family             Education: Education Goals: Education classes will be provided on a weekly basis, covering required topics. Participant will state understanding/return demonstration of topics presented.  Learning Barriers/Preferences:  Learning Barriers/Preferences - 07/06/20 1554       Learning Barriers/Preferences   Learning Barriers None    Learning Preferences None             General Pulmonary Education Topics:  Infection  Prevention: - Provides verbal and written material to individual with discussion of infection control including proper hand washing and proper equipment cleaning during exercise session. Flowsheet Row Pulmonary Rehab from 11/12/2020 in [facility] Cardiac and Pulmonary Rehab  Date 07/14/20  Educator AS  Instruction Review Code 1- Verbalizes Understanding       Falls Prevention: - Provides verbal and written material to individual with discussion of falls prevention and safety. Flowsheet Row Pulmonary Rehab from 11/12/2020 in [facility] Cardiac and Pulmonary Rehab  Date 07/14/20  Educator AS  Instruction Review Code 1- Verbalizes Understanding       Chronic Lung Disease Review: - Group verbal instruction with posters, models, PowerPoint presentations and videos,  to review new updates, new respiratory medications, new advancements in procedures and treatments. Providing information on websites and "800" numbers for continued self-education. Includes information about supplement oxygen, available portable oxygen systems, continuous and intermittent flow rates, oxygen safety, concentrators, and Medicare reimbursement for oxygen. Explanation of Pulmonary Drugs, including class, frequency, complications, importance of spacers, rinsing mouth after steroid MDI's, and proper cleaning methods for nebulizers. Review of basic lung anatomy and physiology related to function, structure, and complications of lung disease. Review of risk factors. Discussion about methods for diagnosing sleep apnea and types of masks and machines  for OSA. Includes a review of the use of types of environmental controls: home humidity, furnaces, filters, dust mite/pet prevention, HEPA vacuums. Discussion about weather changes, air quality and the benefits of nasal washing. Instruction on Warning signs, infection symptoms, calling MD promptly, preventive modes, and value of vaccinations. Review of effective airway clearance, coughing and/or  vibration techniques. Emphasizing that all should Create an Action Plan. Written material given at graduation. Flowsheet Row Pulmonary Rehab from 11/12/2020 in [facility] Cardiac and Pulmonary Rehab  Date 11/05/20  Educator [facility]  Instruction Review Code 1- Verbalizes Understanding       AED/CPR: - Group verbal and written instruction with the use of models to demonstrate the basic use of the AED with the basic ABC's of resuscitation.    Anatomy and Cardiac Procedures: - Group verbal and visual presentation and models provide information about basic cardiac anatomy and function. Reviews the testing methods done to diagnose heart disease and the outcomes of the test results. Describes the treatment choices: Medical Management, Angioplasty, or Coronary Bypass Surgery for treating various heart conditions including Myocardial Infarction, Angina, Valve Disease, and Cardiac Arrhythmias.  Written material given at graduation. Flowsheet Row Pulmonary Rehab from 11/12/2020 in [facility] Cardiac and Pulmonary Rehab  Date 07/30/20  Educator [facility]  Instruction Review Code 1- Verbalizes Understanding       Medication Safety: - Group verbal and visual instruction to review commonly prescribed medications for heart and lung disease. Reviews the medication, class of the drug, and side effects. Includes the steps to properly store meds and maintain the prescription regimen.  Written material given at graduation. Flowsheet Row Pulmonary Rehab from 11/12/2020 in [facility] Cardiac and Pulmonary Rehab  Date 10/22/20  Educator [facility]  Instruction Review Code 1- Verbalizes Understanding       Other: -Provides group and verbal instruction on various topics (see comments)   Knowledge Questionnaire Score:  Knowledge Questionnaire Score - 07/14/20 1439       Knowledge Questionnaire Score   Pre Score 13/18  oxygen              Core Components/Risk Factors/Patient Goals at Admission:  Personal Goals and Risk Factors at  Admission - 07/14/20 1445       Core Components/Risk Factors/Patient Goals on Admission    Weight Management Weight Maintenance;Yes    Intervention Weight Management: Develop a combined nutrition and exercise program designed to reach desired caloric intake, while maintaining appropriate intake of nutrient and fiber, sodium and fats, and appropriate energy expenditure required for the weight goal.;Weight Management: Provide education and appropriate resources to help participant work on and attain dietary goals.;Weight Management/Obesity: Establish reasonable short term and long term weight goals.    Admit Weight 165 lb 6.4 oz (75 kg)    Goal Weight: Short Term 160 lb (72.6 kg)    Goal Weight: Long Term 160 lb (72.6 kg)    Expected Outcomes Short Term: Continue to assess and modify interventions until short term weight is achieved;Long Term: Adherence to nutrition and physical activity/exercise program aimed toward attainment of established weight goal;Weight Maintenance: Understanding of the daily nutrition guidelines, which includes 25-35% calories from fat, 7% or less cal from saturated fats, less than 2102m cholesterol, less than 1.5gm of sodium, & 5 or more servings of fruits and vegetables daily;Understanding recommendations for meals to include 15-35% energy as protein, 25-35% energy from fat, 35-60% energy from carbohydrates, less than 2073mof dietary cholesterol, 20-35 gm of total fiber daily;Understanding of distribution of calorie intake  throughout the day with the consumption of 4-5 meals/snacks    Improve shortness of breath with ADL's Yes    Intervention Provide education, individualized exercise plan and daily activity instruction to help decrease symptoms of SOB with activities of daily living.    Expected Outcomes Short Term: Improve cardiorespiratory fitness to achieve a reduction of symptoms when performing ADLs;Long Term: Be able to perform more ADLs without symptoms or delay the  onset of symptoms    Diabetes Yes    Intervention Provide education about signs/symptoms and action to take for hypo/hyperglycemia.;Provide education about proper nutrition, including hydration, and aerobic/resistive exercise prescription along with prescribed medications to achieve blood glucose in normal ranges: Fasting glucose 65-99 mg/dL    Expected Outcomes Short Term: Participant verbalizes understanding of the signs/symptoms and immediate care of hyper/hypoglycemia, proper foot care and importance of medication, aerobic/resistive exercise and nutrition plan for blood glucose control.;Long Term: Attainment of HbA1C < 7%.    Heart Failure Yes    Intervention Provide a combined exercise and nutrition program that is supplemented with education, support and counseling about heart failure. Directed toward relieving symptoms such as shortness of breath, decreased exercise tolerance, and extremity edema.    Expected Outcomes Improve functional capacity of life;Short term: Attendance in program 2-3 days a week with increased exercise capacity. Reported lower sodium intake. Reported increased fruit and vegetable intake. Reports medication compliance.;Short term: Daily weights obtained and reported for increase. Utilizing diuretic protocols set by physician.;Long term: Adoption of self-care skills and reduction of barriers for early signs and symptoms recognition and intervention leading to self-care maintenance.    Hypertension Yes    Intervention Provide education on lifestyle modifcations including regular physical activity/exercise, weight management, moderate sodium restriction and increased consumption of fresh fruit, vegetables, and low fat dairy, alcohol moderation, and smoking cessation.;Monitor prescription use compliance.    Expected Outcomes Short Term: Continued assessment and intervention until BP is < 140/24m HG in hypertensive participants. < 130/810mHG in hypertensive participants with  diabetes, heart failure or chronic kidney disease.;Long Term: Maintenance of blood pressure at goal levels.    Lipids Yes    Intervention Provide education and support for participant on nutrition & aerobic/resistive exercise along with prescribed medications to achieve LDL <7071mHDL >69m32m  Expected Outcomes Short Term: Participant states understanding of desired cholesterol values and is compliant with medications prescribed. Participant is following exercise prescription and nutrition guidelines.;Long Term: Cholesterol controlled with medications as prescribed, with individualized exercise RX and with personalized nutrition plan. Value goals: LDL < 70mg30mL > 40 mg.             Education:Diabetes - Individual verbal and written instruction to review signs/symptoms of diabetes, desired ranges of glucose level fasting, after meals and with exercise. Acknowledge that pre and post exercise glucose checks will be done for 3 sessions at entry of program. Flowsheet Row [facility]  Date 07/06/20  [facility]ator [facility] Code 1- Verbalizes Understanding       Know Your Numbers and Heart Failure: - Group verbal and visual instruction to discuss disease risk factors for cardiac and pulmonary disease and treatment options.  Reviews associated critical values for Overweight/Obesity, Hypertension, Cholesterol, and Diabetes.  Discusses basics of heart failure: signs/symptoms and treatments.  Introduces Heart Failure Zone chart for action plan for heart failure.  Written material given at graduation. Flowsheet Row [facility]  Date 10/29/20  Educator [facility]  Instruction Review Code 1- Verbalizes Understanding       Core Components/Risk Factors/Patient Goals Review:   Goals and Risk Factor Review     Row Name 08/11/20 1119 09/10/20 1109 10/06/20 1127 11/03/20 1124       Core  Components/Risk Factors/Patient Goals Review   Personal Goals Review Improve shortness of breath with ADL's Weight Management/Obesity;Diabetes Weight Management/Obesity Weight Management/Obesity;Diabetes;Hypertension;Heart Failure;Improve shortness of breath with ADL's    Review Spoke to patient about their shortness of breath and what they can do to improve. Patient has been informed of breathing techniques when starting the program. Patient is informed to tell staff if they have had any med changes and that certain meds they are taking or not taking can be causing shortness of breath. Eryka is doing well in maintaining her weight. She is checking her sugars everyday and is keeping her sugar under control. She is 165 pounds today and wants to maintain. Rhylan has no current concerns with her weight and is maintaining. She has no questions about her medications. Blood pressure has been stable. Austynn is doing well in rehab. Her A1c is down to 5.8!!  She is very pleased with this.  Her breathing is getting better and she is able to do more now.  Her weight is staying steady for the most part.  She denies heart failure symptoms but has noted some heart burn last week.  Her pressures have been good.    Expected Outcomes Short: Attend [facility] regularly to improve shortness of breath with ADL's. Long: maintain independence with ADL's Short: continue to maintain weight. Long: maintain weight independently after [facility]. Short: continue to attened [facility] regularly. Long: Graduate [facility]. Short: Continue to work on breathing Long: C[place]nue to manage diabetes.             Core Components/Risk Factors/Patient Goals at Discharge (Final Review):   Goals and Risk Factor Review - 11/03/20 1124       Core Components/Risk Factors/Patient Goals Review   Personal Goals Review Weight Management/Obesity;Diabetes;Hypertension;Heart Failure;Improve shortness of breath with ADL's    Review Teva is doing well in  rehab. Her A1c is down to 5.8!!  She is very pleased with this.  Her breathing is getting better and she is able to do more now.  Her weight is staying steady for the most part.  She denies heart failure symptoms but has noted some heart burn last week.  Her pressures have been good.    Expected Outcomes Short: Continue to work on breathing Long: COnitnue to manage diabetes.             ITP Comments:  ITP Comments     Row Name 07/06/20 1536 07/14/20 1452 07/21/20 1123 07/29/20 0614 08/05/20 1532   ITP Comments Initial telephone orientation completed. Diagnosis can be found in 11/12. EP orientation scheduled for 2/8 at 1pm Completed 6MWT and gym orientation. Initial ITP created and sent for review to Dr. Emily Filbert, Medical Director. First full day of exercise!  Patient was oriented to gym and equipment including functions, settings, policies, and procedures.  Patient's individual exercise prescription and treatment plan were reviewed.  All starting workloads were established based on the results of the 6 minute walk test done at initial orientation visit.  The plan for exercise progression was also introduced and progression will be customized based on patient's performance and goals. 30 Day review completed. Medical Director ITP review done, changes made  as directed, and signed approval by Market researcher. Completed Initial RD evaluation    Row Name 08/26/20 [phone] 09/23/20 0933 10/21/20 0626 11/18/20 0656     ITP Comments 30 Day review completed. Medical Director ITP review done, changes made as directed, and signed approval by Medical Director. 30 Day review completed. Medical Director ITP review done, changes made as directed, and signed approval by Medical Director. 30 Day review completed. Medical Director ITP review done, changes made as directed, and signed approval by Medical Director. 30 Day review completed. Medical Director ITP review done, changes made as directed, and signed approval  by Medical Director.             Comments:

## 2020-11-19 ENCOUNTER — Encounter: Payer: Medicare PPO | Admitting: *Deleted

## 2020-11-19 ENCOUNTER — Other Ambulatory Visit: Payer: Self-pay

## 2020-11-19 VITALS — Ht 67.5 in | Wt 165.4 lb

## 2020-11-19 DIAGNOSIS — I272 Pulmonary hypertension, unspecified: Secondary | ICD-10-CM | POA: Diagnosis not present

## 2020-11-19 DIAGNOSIS — E1122 Type 2 diabetes mellitus with diabetic chronic kidney disease: Secondary | ICD-10-CM | POA: Diagnosis not present

## 2020-11-19 DIAGNOSIS — N1832 Chronic kidney disease, stage 3b: Secondary | ICD-10-CM | POA: Diagnosis not present

## 2020-11-19 DIAGNOSIS — I10 Essential (primary) hypertension: Secondary | ICD-10-CM | POA: Diagnosis not present

## 2020-11-19 NOTE — Progress Notes (Signed)
Daily Session Note  Patient Details  Name: Jamie Murray MRN: [id number] Date of Birth: August 13, 1945 Referring Provider:   [name] Pulmonary Rehab from 07/14/2020 in [facility] Cardiac and Pulmonary Rehab  Referring Provider [name]       Encounter Date: 11/19/2020  Check In:  Session Check In - 11/19/20 1149       Check-In   Supervising physician immediately available to respond to emergencies See telemetry face sheet for immediately available ER MD    Location [facility]-Cardiac & Pulmonary Rehab    Staff Present Heath Lark, RN, BSN, CCRP;Meredith Sherryll Burger, RN BSN;Joseph Hood RCP,RRT,BSRT;Melissa [place] RDN, Rowe Pavy, [place], ACSM CEP, Exercise Physiologist    Virtual Visit No    Medication changes reported     No    Fall or balance concerns reported    No    Warm-up and Cool-down Performed on first and last piece of equipment    Resistance Training Performed Yes    VAD Patient? No    PAD/SET Patient? No      Pain Assessment   Currently in Pain? No/denies              6 Minute Walk     Row Name 07/14/20 1427 11/19/20 1131       6 Minute Walk   Phase -- Discharge    Distance 780 feet 1030 feet    Distance % Change -- 32 %    Distance Feet Change -- 250 ft    Walk Time 6 minutes 6 minutes    # of Rest Breaks 0 0    MPH 1.47 1.95    METS 1.9 2.59    RPE 11 11    Perceived Dyspnea  1 0    VO2 Peak 6.63 9.08    Symptoms No No    Resting HR 74 bpm 58 bpm    Resting BP 120/60 118/60    Resting Oxygen Saturation  94 % 99 %    Exercise Oxygen Saturation  during 6 min walk 89 % 95 %    Max Ex. HR 92 bpm 98 bpm    Max Ex. BP 134/74 162/68    2 Minute Post BP 108/64 132/64         Interval HR      1 Minute HR 80 80    2 Minute HR 85 91    3 Minute HR 86 96    4 Minute HR 89 93    5 Minute HR 92 98    6 Minute HR 89 98    2 Minute Post HR 83 83    Interval Heart Rate? Yes --         Interval Oxygen      Interval Oxygen? Yes --    Baseline Oxygen  Saturation % 94 % 99 %    1 Minute Oxygen Saturation % 98 % 95 %    1 Minute Liters of Oxygen 0 L 0 L  RA    2 Minute Oxygen Saturation % 96 % 95 %    2 Minute Liters of Oxygen 0 L 0 L    3 Minute Oxygen Saturation % 93 % 97 %    3 Minute Liters of Oxygen 0 L 0 L    4 Minute Oxygen Saturation % 95 % 96 %    4 Minute Liters of Oxygen 0 L 0 L    5 Minute Oxygen Saturation % 89 % 96 %  5 Minute Liters of Oxygen 0 L 0 L    6 Minute Oxygen Saturation % 91 % 96 %    6 Minute Liters of Oxygen 0 L 0 L    2 Minute Post Oxygen Saturation % 96 % 98 %    2 Minute Post Liters of Oxygen 0 L 0 L              Social History   Tobacco Use  Smoking Status Never  Smokeless Tobacco Never    Goals Met:  Proper associated with RPD/PD & O2 Sat Independence with exercise equipment Exercise tolerated well No report of cardiac concerns or symptoms  Goals Unmet:  Not Applicable  Comments: Pt able to follow exercise prescription today without complaint.  Will continue to monitor for progression.    Dr. Emily Filbert is Medical Director for [place].  Dr. Ottie Glazier is Medical Director for [facility].

## 2020-11-24 ENCOUNTER — Other Ambulatory Visit: Payer: Self-pay

## 2020-11-24 DIAGNOSIS — I272 Pulmonary hypertension, unspecified: Secondary | ICD-10-CM

## 2020-11-24 NOTE — Progress Notes (Signed)
Daily Session Note  Patient Details  Name: Jamie Murray MRN: [id number] Date of Birth: 02/17/1946 Referring Provider:   Flowsheet Row Pulmonary Rehab from 07/14/2020 in [facility] Cardiac and Pulmonary Rehab  Referring Provider [place]       Encounter Date: 11/24/2020  Check In:  Session Check In - 11/24/20 1059       Check-In   Supervising physician immediately available to respond to emergencies See telemetry face sheet for immediately available ER MD    Location [facility]-Cardiac & Pulmonary Rehab    Staff Present Kelly Bollinger, MPA, RN;Amanda Sommer, BA, ACSM CEP, Exercise Physiologist;Melissa Caiola RDN, LDN;Joseph Hood RCP,RRT,BSRT    Virtual Visit No    Medication changes reported     No    Fall or balance concerns reported    No    Warm-up and Cool-down Performed on first and last piece of equipment    Resistance Training Performed Yes    VAD Patient? No    PAD/SET Patient? No      Pain Assessment   Currently in Pain? No/denies                Social History   Tobacco Use  Smoking Status Never  Smokeless Tobacco Never    Goals Met:  Independence with exercise equipment Exercise tolerated well No report of cardiac concerns or symptoms Strength training completed today  Goals Unmet:  Not Applicable  Comments: Pt able to follow exercise prescription today without complaint.  Will continue to monitor for progression.    Dr. Mark Miller is Medical Director for [facility].  Dr. Fuad Aleskerov is Medical Director for [facility]. 

## 2020-11-26 ENCOUNTER — Other Ambulatory Visit: Payer: Self-pay

## 2020-11-26 ENCOUNTER — Encounter: Payer: Medicare PPO | Admitting: *Deleted

## 2020-11-26 DIAGNOSIS — I272 Pulmonary hypertension, unspecified: Secondary | ICD-10-CM

## 2020-11-26 NOTE — Progress Notes (Signed)
Daily Session Note  Patient Details  Name: Jamie Murray MRN: [id number] Date of Birth: 24-Jan-1946 Referring Provider:   [name] Pulmonary Rehab from 07/14/2020 in [facility] Cardiac and Pulmonary Rehab  Referring Provider [name]       Encounter Date: 11/26/2020  Check In:  Session Check In - 11/26/20 1123       Check-In   Supervising physician immediately available to respond to emergencies See telemetry face sheet for immediately available ER MD    Location [facility]-Cardiac & Pulmonary Rehab    Staff Present Hope Budds RDN, LDN;Ziara Thelander Sherryll Burger, RN BSN;Joseph Karie Fetch, MPA, RN    Virtual Visit No    Medication changes reported     No    Fall or balance concerns reported    No    Warm-up and Cool-down Performed on first and last piece of equipment    Resistance Training Performed Yes    VAD Patient? No    PAD/SET Patient? No      Pain Assessment   Currently in Pain? No/denies                Social History   Tobacco Use  Smoking Status Never  Smokeless Tobacco Never    Goals Met:  Independence with exercise equipment Exercise tolerated well No report of cardiac concerns or symptoms Strength training completed today  Goals Unmet:  Not Applicable  Comments: Pt able to follow exercise prescription today without complaint.  Will continue to monitor for progression.    Dr. Emily Filbert is Medical Director for [place].  Dr. Ottie Glazier is Medical Director for [facility] Pulmonary Rehabilitation.

## 2020-12-01 ENCOUNTER — Other Ambulatory Visit: Payer: Self-pay

## 2020-12-01 DIAGNOSIS — I272 Pulmonary hypertension, unspecified: Secondary | ICD-10-CM | POA: Diagnosis not present

## 2020-12-01 NOTE — Progress Notes (Signed)
Daily Session Note  Patient Details  Name: Jamie Murray MRN: [id number] Date of Birth: Oct 25, 1945 Referring Provider:   [name] Pulmonary Rehab from 07/14/2020 in [facility] Cardiac and Pulmonary Rehab  Referring Provider [name]       Encounter Date: 12/01/2020  Check In:  Session Check In - 12/01/20 1105       Check-In   Supervising physician immediately available to respond to emergencies See telemetry face sheet for immediately available ER MD    Location [facility]-Cardiac & Pulmonary Rehab    Staff Present Birdie Sons, MPA, RN;Melissa Caiola RDN, Rowe Pavy, BA, ACSM CEP, Exercise Physiologist;Joseph Tessie Fass RCP,RRT,BSRT    Virtual Visit No    Medication changes reported     No    Fall or balance concerns reported    No    Warm-up and Cool-down Performed on first and last piece of equipment    Resistance Training Performed Yes    VAD Patient? No    PAD/SET Patient? No      Pain Assessment   Currently in Pain? No/denies                Social History   Tobacco Use  Smoking Status Never  Smokeless Tobacco Never    Goals Met:  Independence with exercise equipment Exercise tolerated well No report of cardiac concerns or symptoms Strength training completed today  Goals Unmet:  Not Applicable  Comments: Pt able to follow exercise prescription today without complaint.  Will continue to monitor for progression.    Dr. Emily Filbert is Medical Director for [facility].  Dr. Ottie Glazier is Medical Director for [facility] Pulmonary Rehabilitation.

## 2020-12-03 ENCOUNTER — Other Ambulatory Visit: Payer: Self-pay

## 2020-12-03 DIAGNOSIS — I272 Pulmonary hypertension, unspecified: Secondary | ICD-10-CM

## 2020-12-03 NOTE — Progress Notes (Signed)
Pulmonary Individual Treatment Plan  Patient Details  Name: Jamie Murray MRN: [id number] Date of Birth: [birth year] Referring Provider:   [name] Pulmonary Rehab from 07/14/2020 in [facility] Cardiac and Pulmonary Rehab  Referring Provider [name]       Initial Encounter Date:  Flowsheet Row Pulmonary Rehab from 07/14/2020 in [facility] Cardiac and Pulmonary Rehab  Date 07/14/20       Visit Diagnosis: Pulmonary hypertension ([place])  Patient's Home Medications on Admission:  Current Outpatient Medications:    ambrisentan (LETAIRIS) 10 MG tablet, Take 1 tablet (10 mg total) by mouth daily., Disp: 30 tablet, Rfl: 11   amLODipine (NORVASC) 10 MG tablet, Take 1 tablet (10 mg total) by mouth daily., Disp: 90 tablet, Rfl: 0   aspirin 81 MG tablet, Take 81 mg by mouth daily., Disp: , Rfl:    clonazePAM (KLONOPIN) 1 MG tablet, Take 1 mg by mouth daily., Disp: , Rfl:    dapagliflozin propanediol (FARXIGA) 10 MG TABS tablet, Take 1 tablet (10 mg total) by mouth daily before breakfast., Disp: 90 tablet, Rfl: 3   escitalopram (LEXAPRO) 10 MG tablet, Take 10 mg by mouth daily., Disp: , Rfl:    esomeprazole (NEXIUM) 40 MG capsule, Take 40 mg by mouth daily at 12 noon., Disp: , Rfl:    Fe Fum-FA-B Cmp-C-Zn-Mg-Mn-Cu (HEMOCYTE PLUS) 106-1 MG CAPS, Take 1 capsule by mouth daily., Disp: , Rfl: 6   furosemide (LASIX) 20 MG tablet, Take 1 tablet (20 mg total) by mouth every other day., Disp: 30 tablet, Rfl: 6   hydrALAZINE (APRESOLINE) 50 MG tablet, Take 1 tablet (50 mg total) by mouth every 8 (eight) hours., Disp: 90 tablet, Rfl: 0   insulin aspart (NOVOLOG) 100 UNIT/ML FlexPen, Inject 4 Units into the skin 3 (three) times daily with meals. (Patient taking differently: Inject 4 Units into the skin 3 (three) times daily with meals. Only for BS greater than 200), Disp: 15 mL, Rfl: 0   insulin degludec (TRESIBA) 200 UNIT/ML FlexTouch Pen, Inject 26 Units into the skin at bedtime., Disp: , Rfl:    levothyroxine  (SYNTHROID) 75 MCG tablet, Take 75 mcg by mouth daily before breakfast., Disp: , Rfl:    loratadine (CLARITIN) 10 MG tablet, Take 10 mg by mouth daily. , Disp: , Rfl:    meclizine (ANTIVERT) 25 MG tablet, Take 25 mg by mouth 3 (three) times daily as needed for dizziness. , Disp: , Rfl:    metoprolol succinate (TOPROL-XL) 50 MG 24 hr tablet, Take 50 mg by mouth daily. Take with or immediately following a meal., Disp: , Rfl:    Multiple Vitamins-Minerals (MULTI ADULT GUMMIES) CHEW, Chew by mouth daily., Disp: , Rfl:    potassium chloride (KLOR-CON) 10 MEQ tablet, Take 1 tablet (10 mEq total) by mouth every other day., Disp: 15 tablet, Rfl: 6   rosuvastatin (CRESTOR) 10 MG tablet, Take 10 mg by mouth daily., Disp: , Rfl:    Selexipag (UPTRAVI) 200 MCG TABS, Take 8 tablets (1,600 mcg total) by mouth in the morning and at bedtime. As directed by clinic, Disp: 60 tablet, Rfl: 11   tadalafil, PAH, (ADCIRCA) 20 MG tablet, Take 2 tablets (40 mg total) by mouth daily., Disp: 60 tablet, Rfl: 11  Past Medical History: Past Medical History:  Diagnosis Date   Allergy    Anemia    Anxiety    COPD (chronic obstructive pulmonary disease) (HCC)    Depression    Diabetes (HCC)    GERD (gastroesophageal reflux  disease)    Hyperlipidemia    Hypertension    Insomnia    Migraines    Osteoporosis    [facility] spotted fever    Transient cerebral ischemia    Vertigo    every 2-3 months    Tobacco Use: Social History   Tobacco Use  Smoking Status Never  Smokeless Tobacco Never    Labs: Recent Review Flowsheet Data     Labs for ITP Cardiac and Pulmonary Rehab Latest Ref Rng & Units 03/07/2016 06/07/2016 11/29/2016 01/09/2020 07/03/2020   Cholestrol 0 - 200 mg/dL 145 213(H) 95(L) 128 121   LDLCALC 0 - 99 mg/dL 77 143(H) 45 68 55   HDL >40 mg/dL 43 37(L) 29(L) 24(L) 43   Trlycerides <150 mg/dL 123 164(H) 105 178(H) 115   Hemoglobin A1c 4.8 - 5.6 % 7.8(H) 8.5(H) 7.1(H) 10.9(H) -        Pulmonary  Assessment Scores:  Pulmonary Assessment Scores     Row Name 07/14/20 1440 12/01/20 1210       ADL UCSD   ADL Phase -- Exit    SOB Score total 33 8    Rest 3 0    Walk 3 0    Stairs 5 3    Bath 0 0    Dress 0 0    Shop 0 1         CAT Score      CAT Score 19 4         mMRC Score      mMRC Score 2 --            UCSD: Self-administered rating of dyspnea associated with activities of daily living (ADLs) 6-point scale (0 = "not at all" to 5 = "maximal or unable to do because of breathlessness")  Scoring Scores range from 0 to 120.  Minimally important difference is 5 units  CAT: CAT can identify the health impairment of COPD patients and is better correlated with disease progression.  CAT has a scoring range of zero to 40. The CAT score is classified into four groups of low (less than 10), medium (10 - 20), high (21-30) and very high (31-40) based on the impact level of disease on health status. A CAT score over 10 suggests significant symptoms.  A worsening CAT score could be explained by an exacerbation, poor medication adherence, poor inhaler technique, or progression of COPD or comorbid conditions.  CAT MCID is 2 points  mMRC: mMRC (Modified Medical Research Council) Dyspnea Scale is used to assess the degree of baseline functional disability in patients of respiratory disease due to dyspnea. No minimal important difference is established. A decrease in score of 1 point or greater is considered a positive change.   Pulmonary Function Assessment:   Exercise Target Goals: Exercise Program Goal: Individual exercise prescription set using results from initial 6 min walk test and THRR while considering  patient's activity barriers and safety.   Exercise Prescription Goal: Initial exercise prescription builds to 30-45 minutes a day of aerobic activity, 2-3 days per week.  Home exercise guidelines will be given to patient during program as part of exercise prescription that  the participant will acknowledge.  Education: Aerobic Exercise: - Group verbal and visual presentation on the components of exercise prescription. Introduces F.I.T.T principle from ACSM for exercise prescriptions.  Reviews F.I.T.T. principles of aerobic exercise including progression. Written material given at graduation. Flowsheet Row Pulmonary Rehab from 12/03/2020 in [facility] Cardiac and Pulmonary Rehab  Date 11/26/20  Educator [facility]  Instruction Review Code 1- Verbalizes Understanding       Education: Resistance Exercise: - Group verbal and visual presentation on the components of exercise prescription. Introduces F.I.T.T principle from ACSM for exercise prescriptions  Reviews F.I.T.T. principles of resistance exercise including progression. Written material given at graduation. Flowsheet Row Pulmonary Rehab from 12/03/2020 in [facility] Cardiac and Pulmonary Rehab  Date 12/03/20  Educator [facility]  Instruction Review Code 1- Verbalizes Understanding        Education: Exercise & Equipment Safety: - Individual verbal instruction and demonstration of equipment use and safety with use of the equipment. Flowsheet Row Pulmonary Rehab from 12/03/2020 in [facility] Cardiac and Pulmonary Rehab  Date 07/14/20  Educator AS  Instruction Review Code 1- Verbalizes Understanding       Education: Exercise Physiology & General Exercise Guidelines: - Group verbal and written instruction with models to review the exercise physiology of the cardiovascular system and associated critical values. Provides general exercise guidelines with specific guidelines to those with heart or lung disease.  Flowsheet Row Pulmonary Rehab from 12/03/2020 in [facility] Cardiac and Pulmonary Rehab  Date 09/17/20  Educator [facility]  Instruction Review Code 1- Verbalizes Understanding       Education: Flexibility, Balance, Mind/Body Relaxation: - Group verbal and visual presentation with interactive activity on the components of exercise  prescription. Introduces F.I.T.T principle from ACSM for exercise prescriptions. Reviews F.I.T.T. principles of flexibility and balance exercise training including progression. Also discusses the mind body connection.  Reviews various relaxation techniques to help reduce and manage stress (i.e. Deep breathing, progressive muscle relaxation, and visualization). Balance handout provided to take home. Written material given at graduation. Flowsheet Row Pulmonary Rehab from 12/03/2020 in [facility] Cardiac and Pulmonary Rehab  Date 10/08/20  Educator AS  Instruction Review Code 1- Verbalizes Understanding       Activity Barriers & Risk Stratification:  Activity Barriers & Cardiac Risk Stratification - 07/06/20 1534       Activity Barriers & Cardiac Risk Stratification   Activity Barriers Back Problems;Muscular Weakness;Assistive Device   cane            6 Minute Walk:  6 Minute Walk     Row Name 07/14/20 1427 11/19/20 1131       6 Minute Walk   Phase -- Discharge    Distance 780 feet 1030 feet    Distance % Change -- 32 %    Distance Feet Change -- 250 ft    Walk Time 6 minutes 6 minutes    # of Rest Breaks 0 0    MPH 1.47 1.95    METS 1.9 2.59    RPE 11 11    Perceived Dyspnea  1 0    VO2 Peak 6.63 9.08    Symptoms No No    Resting HR 74 bpm 58 bpm    Resting BP 120/60 118/60    Resting Oxygen Saturation  94 % 99 %    Exercise Oxygen Saturation  during 6 min walk 89 % 95 %    Max Ex. HR 92 bpm 98 bpm    Max Ex. BP 134/74 162/68    2 Minute Post BP 108/64 132/64         Interval HR      1 Minute HR 80 80    2 Minute HR 85 91    3 Minute HR 86 96    4 Minute HR 89 93    5  Minute HR 92 98    6 Minute HR 89 98    2 Minute Post HR 83 83    Interval Heart Rate? Yes --         Interval Oxygen      Interval Oxygen? Yes --    Baseline Oxygen Saturation % 94 % 99 %    1 Minute Oxygen Saturation % 98 % 95 %    1 Minute Liters of Oxygen 0 L 0 L  RA    2 Minute Oxygen  Saturation % 96 % 95 %    2 Minute Liters of Oxygen 0 L 0 L    3 Minute Oxygen Saturation % 93 % 97 %    3 Minute Liters of Oxygen 0 L 0 L    4 Minute Oxygen Saturation % 95 % 96 %    4 Minute Liters of Oxygen 0 L 0 L    5 Minute Oxygen Saturation % 89 % 96 %    5 Minute Liters of Oxygen 0 L 0 L    6 Minute Oxygen Saturation % 91 % 96 %    6 Minute Liters of Oxygen 0 L 0 L    2 Minute Post Oxygen Saturation % 96 % 98 %    2 Minute Post Liters of Oxygen 0 L 0 L           Oxygen Initial Assessment:  Oxygen Initial Assessment - 07/06/20 1604       Home Oxygen   Home Oxygen Device None    Sleep Oxygen Prescription CPAP    Home Exercise Oxygen Prescription None    Home Resting Oxygen Prescription None    Compliance with Home Oxygen Use No   issues with CPAP compliance due to congestion for last 2 months     Initial 6 min Walk   Oxygen Used None      Program Oxygen Prescription   Program Oxygen Prescription None      Intervention   Short Term Goals To learn and understand importance of monitoring SPO2 with pulse oximeter and demonstrate accurate use of the pulse oximeter.;To learn and understand importance of maintaining oxygen saturations>88%;To learn and demonstrate proper pursed lip breathing techniques or other breathing techniques. ;To learn and demonstrate proper use of respiratory medications    Long  Term Goals Verbalizes importance of monitoring SPO2 with pulse oximeter and return demonstration;Compliance with respiratory medication;Exhibits proper breathing techniques, such as pursed lip breathing or other method taught during program session;Maintenance of O2 saturations>88%             Oxygen Re-Evaluation:  Oxygen Re-Evaluation     Row Name 07/21/20 1125 08/11/20 1113 09/10/20 1102 10/06/20 1130 11/03/20 1128     Program Oxygen Prescription   Program Oxygen Prescription _0      Home Oxygen   Home Oxygen Device _1     Sleep Oxygen Prescription _2    Liters per minute 0 -- -- 0 0   Home Exercise Oxygen Prescription _3    Home Resting Oxygen Prescription None None None None --   Compliance with Home Oxygen Use _4      Goals/Expected Outcomes   Short Term Goals To learn and demonstrate proper pursed lip breathing techniques or other breathing techniques.  To learn and demonstrate proper pursed lip breathing techniques or other breathing techniques. ;To learn and understand importance  of maintaining oxygen saturations>88%;To learn and understand importance of monitoring SPO2 with pulse oximeter and demonstrate accurate use of the pulse oximeter. To learn and understand importance of maintaining oxygen saturations>88% Other To learn and understand importance of monitoring SPO2 with pulse oximeter and demonstrate accurate use of the pulse oximeter.;To learn and understand importance of maintaining oxygen saturations>88%;To learn and demonstrate proper pursed lip breathing techniques or other breathing techniques. ;To learn and demonstrate proper use of respiratory medications   Long  Term Goals -- Verbalizes importance of monitoring SPO2 with pulse oximeter and return demonstration;Maintenance of O2 saturations>88%;Exhibits proper breathing techniques, such as pursed lip breathing or other method taught during program session;Compliance with respiratory medication Maintenance of O2 saturations>88% Other Verbalizes importance of monitoring SPO2 with pulse oximeter and return demonstration;Exhibits proper breathing techniques, such as pursed lip breathing or other method taught during program session;Maintenance of O2 saturations>88%;Compliance with respiratory medication   Comments Reviewed PLB technique with pt.  Talked about how it works and it's importance in maintaining their exercise saturations. Naylin is going to purchase a pulse oximeter to check her oxygen at  home. Informed her the importance of having a pulse oximeter and keeping her oxygen above 88 percent. She does not have a pulse oximeter to check her oxygen saturation at home. Informed her where to get one and explained why it is important to have one. Reviewed that oxygen saturations should be 88 percent and above. Patient has a pulse oximeter at home to check his oxygen. Bostyn does not have any questions or concerns with her oxygen or shortness of breath. She states that she has not been getting short of breath as much. She has been informed of breathing techniques. Kimbella is faithful to her CPAP and sleeps well with it.  She is breathing better and using her PLB.  She is trying to get better and keeps an eye on her oxygen levels.  Today, she did not get SOB while walking on the track.   Goals/Expected Outcomes Short: Become more profiecient at using PLB.   Long: Become independent at using PLB. Short: purchase a pulse oximeter. Long: maintain oxygen saturations of 88 percent and above independently Short: monitor oxygen at home with exertion. Long: maintain oxygen saturations above 88 percent independently. Short: continue to use breathing techniques. Long: maintain breathing techniques independently post [employer] --            Oxygen Discharge (Final Oxygen Re-Evaluation):  Oxygen Re-Evaluation - 11/03/20 1128       Program Oxygen Prescription   Program Oxygen Prescription None      Home Oxygen   Home Oxygen Device None    Sleep Oxygen Prescription CPAP    Liters per minute 0    Home Exercise Oxygen Prescription None    Compliance with Home Oxygen Use Yes      Goals/Expected Outcomes   Short Term Goals To learn and understand importance of monitoring SPO2 with pulse oximeter and demonstrate accurate use of the pulse oximeter.;To learn and understand importance of maintaining oxygen saturations>88%;To learn and demonstrate proper pursed lip breathing techniques or other breathing  techniques. ;To learn and demonstrate proper use of respiratory medications    Long  Term Goals Verbalizes importance of monitoring SPO2 with pulse oximeter and return demonstration;Exhibits proper breathing techniques, such as pursed lip breathing or other method taught during program session;Maintenance of O2 saturations>88%;Compliance with respiratory medication    Comments Matayah is faithful to her CPAP and sleeps well with it.  She is breathing better and using her PLB.  She is trying to get better and keeps an eye on her oxygen levels.  Today, she did not get SOB while walking on the track.             Initial Exercise Prescription:  Initial Exercise Prescription - 07/14/20 1400       Date of Initial Exercise RX and Referring Provider   Date 07/14/20    Referring Provider [name]      Treadmill   MPH 1.4    Grade 0    Minutes 15    METs 2      Recumbant Bike   Level 1    RPM 60    Minutes 15    METs 2      NuStep   Level 1    SPM 80    Minutes 15    METs 2      Recumbant Elliptical   Level 1    RPM 50    Minutes 15    METs 2      REL-XR   Level 1    Speed 50    Minutes 15    METs 2      Prescription Details   Frequency (times per week) 3    Duration Progress to 30 minutes of continuous aerobic without signs/symptoms of physical distress      Intensity   THRR 40-80% of Max Heartrate 103-132    Ratings of Perceived Exertion 11-13    Perceived Dyspnea 0-4      Progression   Progression Continue to progress workloads to maintain intensity without signs/symptoms of physical distress.      Resistance Training   Training Prescription Yes    Weight 2 lb    Reps 10-15             Perform Capillary Blood Glucose checks as needed.  Exercise Prescription Changes:   Exercise Prescription Changes     Row Name 07/14/20 1400 07/29/20 1000 08/11/20 1400 08/25/20 1500 09/09/20 1300     Response to Exercise   Blood Pressure (Admit) 120/60 110/60 118/70  116/78 114/58   Blood Pressure (Exercise) 134/74 122/60 124/60 124/68 128/64   Blood Pressure (Exit) 108/64 108/62 118/64 110/50 110/52   Heart Rate (Admit) 74 bpm 69 bpm 72 bpm 67 bpm 67 bpm   Heart Rate (Exercise) 92 bpm 75 bpm 78 bpm 80 bpm 78 bpm   Heart Rate (Exit) 83 bpm 64 bpm 69 bpm 66 bpm 69 bpm   Oxygen Saturation (Admit) 94 % 95 % 95 % 98 % 98 %   Oxygen Saturation (Exercise) 89 % 87 % 93 % 97 % 94 %   Oxygen Saturation (Exit) 96 % 93 % 94 % 92 % 98 %   Rating of Perceived Exertion (Exercise) _0 --   Perceived Dyspnea (Exercise) 1 1 0 4 1   Symptoms none SOB -- SOB on treadmill --   Comments -- second full day of exercise -- -- --   Duration -- Continue with 30 min of aerobic exercise without signs/symptoms of physical distress. Continue with 30 min of aerobic exercise without signs/symptoms of physical distress. Continue with 30 min of aerobic exercise without signs/symptoms of physical distress. Continue with 30 min of aerobic exercise without signs/symptoms of physical distress.   Intensity -- THRR unchanged THRR unchanged THRR unchanged THRR unchanged     Progression   Progression -- Continue  to progress workloads to maintain intensity without signs/symptoms of physical distress. Continue to progress workloads to maintain intensity without signs/symptoms of physical distress. Continue to progress workloads to maintain intensity without signs/symptoms of physical distress. Continue to progress workloads to maintain intensity without signs/symptoms of physical distress.   Average METs -- 1.86 2.47 2.2 2.1     Resistance Training   Training Prescription -- Yes Yes Yes Yes   Weight -- 2 lb 2 lb 2 lb 2 lb   Reps -- 10-15 10-15 10-15 10-15     Interval Training   Interval Training -- No No No --     Treadmill   MPH -- 1.4 -- 1.4 1.4   Grade -- 0 -- 0 0   Minutes -- 15 -- 15 15   METs -- 2.07 -- 2.07 2.07     Recumbant Bike   Level -- -- 1 1 --   RPM -- -- 60 --  --   Watts -- -- -- 15 --   Minutes -- -- 15 15 --   METs -- -- 3.04 2.54 --     NuStep   Level -- -- _0 Minutes -- -- _1 METs -- -- 1.9 1.9 1.8     Recumbant Elliptical   Level -- 1 -- -- --   Minutes -- 15 -- -- --   METs -- 1.1 -- -- --     REL-XR   Level -- 1 -- -- --   Minutes -- 30 -- -- --   METs -- 2.4 -- -- --     Home Exercise Plan   Plans to continue exercise at -- -- -- Home (comment)  walking, elliptical Home (comment)  walking, elliptical   Frequency -- -- -- Add 2 additional days to program exercise sessions. Add 2 additional days to program exercise sessions.   Initial Home Exercises Provided -- -- -- 08/18/20 08/18/20    Row Name 09/23/20 1700 10/21/20 1500 11/05/20 1100 11/18/20 1200 12/01/20 1500     Response to Exercise   Blood Pressure (Admit) 118/66 120/60 136/60 120/60 122/70   Blood Pressure (Exercise) 128/64 118/58 138/64 128/76 --   Blood Pressure (Exit) 102/60 120/68 104/62 118/64 96/58   Heart Rate (Admit) 63 bpm 55 bpm 65 bpm 58 bpm 62 bpm   Heart Rate (Exercise) 70 bpm 67 bpm 84 bpm 72 bpm 76 bpm   Heart Rate (Exit) 64 bpm 65 bpm 63 bpm 66 bpm 65 bpm   Oxygen Saturation (Admit) 99 % 98 % 96 % 98 % 97 %   Oxygen Saturation (Exercise) 91 % 99 % 96 % 97 % 95 %   Oxygen Saturation (Exit) 99 % 96 % 97 % 98 % 98 %   Rating of Perceived Exertion (Exercise) _2 Perceived Dyspnea (Exercise) 1 0 1 1 0   Symptoms -- none none none none   Duration Continue with 30 min of aerobic exercise without signs/symptoms of physical distress. Continue with 30 min of aerobic exercise without signs/symptoms of physical distress. Continue with 30 min of aerobic exercise without signs/symptoms of physical distress. Continue with 30 min of aerobic exercise without signs/symptoms of physical distress. Continue with 30 min of aerobic exercise without signs/symptoms of physical distress.   Intensity THRR unchanged THRR unchanged THRR unchanged THRR  unchanged THRR unchanged     Progression   Progression Continue to progress workloads to  maintain intensity without signs/symptoms of physical distress. Continue to progress workloads to maintain intensity without signs/symptoms of physical distress. Continue to progress workloads to maintain intensity without signs/symptoms of physical distress. Continue to progress workloads to maintain intensity without signs/symptoms of physical distress. Continue to progress workloads to maintain intensity without signs/symptoms of physical distress.   Average METs 2.07 1.85 1.9 2.29 2.3     Resistance Training   Training Prescription _0    Weight 2 lb 2 lb 2 lb 2 lb 2 lb   Reps 10-15 10-15 10-15 10-15 10-15     Interval Training   Interval Training _1      Treadmill   MPH 1.4 1 -- -- --   Grade -- 0 -- -- --   Minutes 15 15 -- -- --   METs 2.07 1.77 -- -- --     NuStep   Level -- _2 Minutes -- _3 METs -- 2 1.9 1.8 --     REL-XR   Level 1 -- -- 1 --   Minutes 15 -- -- 15 --   METs -- -- -- 2.8 --     Track   Laps -- -- _4 Minutes -- -- _5 METs -- -- -- 1.9 1.9     Home Exercise Plan   Plans to continue exercise at Home (comment)  walking, elliptical Home (comment)  walking, elliptical Home (comment)  walking, elliptical Home (comment)  walking, elliptical Home (comment)  walking, elliptical   Frequency Add 2 additional days to program exercise sessions. Add 2 additional days to program exercise sessions. Add 2 additional days to program exercise sessions. Add 2 additional days to program exercise sessions. Add 2 additional days to program exercise sessions.   Initial Home Exercises Provided 08/18/20 08/18/20 08/18/20 08/18/20 08/18/20            Exercise Comments:   Exercise Comments     Row Name 07/21/20 1123 12/03/20 1132         Exercise Comments First full day of exercise!  Patient was oriented to gym and  equipment including functions, settings, policies, and procedures.  Patient's individual exercise prescription and treatment plan were reviewed.  All starting workloads were established based on the results of the 6 minute walk test done at initial orientation visit.  The plan for exercise progression was also introduced and progression will be customized based on patient's performance and goals. Vedika graduated today from  rehab with 35 sessions completed.  Details of the patient's exercise prescription and what She needs to do in order to continue the prescription and progress were discussed with patient.  Patient was given a copy of prescription and goals.  Patient verbalized understanding.  Raneen plans to continue to exercise by going to [employer].               Exercise Goals and Review:   Exercise Goals     Row Name 07/14/20 1447             Exercise Goals   Increase Physical Activity Yes       Intervention Provide advice, education, support and counseling about physical activity/exercise needs.;Develop an individualized exercise prescription for aerobic and resistive training based on initial evaluation findings, risk stratification, comorbidities and participant's personal goals.       Expected Outcomes Short Term: Attend  rehab on a regular basis to increase amount of physical activity.;Long Term: Add in home exercise to make exercise part of routine and to increase amount of physical activity.;Long Term: Exercising regularly at least 3-5 days a week.       Increase Strength and Stamina Yes       Intervention Provide advice, education, support and counseling about physical activity/exercise needs.;Develop an individualized exercise prescription for aerobic and resistive training based on initial evaluation findings, risk stratification, comorbidities and participant's personal goals.       Expected Outcomes Short Term: Increase workloads from initial exercise prescription for  resistance, speed, and METs.;Short Term: Perform resistance training exercises routinely during rehab and add in resistance training at home;Long Term: Improve cardiorespiratory fitness, muscular endurance and strength as measured by increased METs and functional capacity (6MWT)       Able to understand and use rate of perceived exertion (RPE) scale Yes       Intervention Provide education and explanation on how to use RPE scale       Expected Outcomes Short Term: Able to use RPE daily in rehab to express subjective intensity level;Long Term:  Able to use RPE to guide intensity level when exercising independently       Able to understand and use Dyspnea scale Yes       Intervention Provide education and explanation on how to use Dyspnea scale       Expected Outcomes Short Term: Able to use Dyspnea scale daily in rehab to express subjective sense of shortness of breath during exertion;Long Term: Able to use Dyspnea scale to guide intensity level when exercising independently       Knowledge and understanding of Target Heart Rate Range (THRR) Yes       Intervention Provide education and explanation of THRR including how the numbers were predicted and where they are located for reference       Expected Outcomes Short Term: Able to state/look up THRR;Long Term: Able to use THRR to govern intensity when exercising independently;Short Term: Able to use daily as guideline for intensity in rehab       Able to check pulse independently Yes       Intervention Provide education and demonstration on how to check pulse in carotid and radial arteries.;Review the importance of being able to check your own pulse for safety during independent exercise       Expected Outcomes Short Term: Able to explain why pulse checking is important during independent exercise;Long Term: Able to check pulse independently and accurately       Understanding of Exercise Prescription Yes       Intervention Provide education, explanation,  and written materials on patient's individual exercise prescription       Expected Outcomes Short Term: Able to explain program exercise prescription;Long Term: Able to explain home exercise prescription to exercise independently                Exercise Goals Re-Evaluation :  Exercise Goals Re-Evaluation     Row Name 07/21/20 1124 07/29/20 1004 08/11/20 1116 08/18/20 1134 08/25/20 1535     Exercise Goal Re-Evaluation   Exercise Goals Review Able to understand and use rate of perceived exertion (RPE) scale;Able to understand and use Dyspnea scale;Knowledge and understanding of Target Heart Rate Range (THRR);Understanding of Exercise Prescription Increase Physical Activity;Increase Strength and Stamina;Understanding of Exercise Prescription Increase Physical Activity;Increase Strength and Stamina;Understanding of Exercise Prescription Increase Physical Activity;Increase Strength and Stamina Increase Physical  Activity;Increase Strength and Stamina;Understanding of Exercise Prescription   Comments Reviewed RPE and dyspnea scales, THR and program prescription with pt today.  Pt voiced understanding and was given a copy of goals to take home. Adell is off to a good start in rehab.  She can completed her first two full days of exercise thus far.  We will continue to montior her progress. Amily uses a recumbant eliptical at home twice a week. She is going to try to go more than 15  minutes on her machine at home. She still needs home exercise and is increases her levels in rehab. Pt able to follow exercise prescription today without complaint.  Will continue to monitor for progression.  Reviewed home exercise with pt today.  Pt plans to use elliptical and walk for exercise.  Reviewed THR, pulse, RPE, sign and symptoms, pulse oximetery and when to call 911 or MD.  Also discussed weather considerations and indoor options.  Pt voiced understanding. Margaretta is doing well in rehab.  She has missed her last two  session due to GI issues and dental surgery.  She is up to 15 watts on the bike.  We will continue to montior her progress.   Expected Outcomes Short: Use RPE daily to regulate intensity. Long: Follow program prescription in THR. Short: Continue to attend rehab regularly Long: Continue to follow program prescription. Short: increase honme exercise. Long: maintain exercise at home independently Short: monitor HR and O2 when exercising at home Long: maintain exercise on her own Short: Improved attendance again Long: Continue to improve stamina    Row Name 09/09/20 1401 09/23/20 1749 10/06/20 1132 10/21/20 1513 11/03/20 1123     Exercise Goal Re-Evaluation   Exercise Goals Review Increase Physical Activity;Increase Strength and Stamina -- Increase Strength and Stamina Increase Strength and Stamina;Increase Physical Activity;Understanding of Exercise Prescription Increase Strength and Stamina;Increase Physical Activity;Understanding of Exercise Prescription   Comments Jasilyn continues to attend consistently.  Oxygen levels stay in the mid 90s during exercise.  Staff will encourage increasing levels/adding incline. Ligia is ready to increase levels for exercise.  Staff will review THR and how to increase safely. Hiya has been able to improve her strength and stamina in rehab. She states that she has not been able to do 80 steps per minute on the NuStep but has been able to. Margaretta would like to get back to walking on the track.  She says she enjoys walking.  We will continue to encourage her to increase spm and continue to monitor her progress. Lindalee is doing well in rehab.  Today she was able to walk 6 laps without using her cane.  Yesterday she was able to walk 1/4 mile at the park without her cane.  She is feeling better and has more stamina. She is planning to go to the [place] to get set up for exercise for after graduation.  She is nearing graduation already!   Expected Outcomes Short: increase levels  Long: increase MET level Short:increase levels on machines Long:  improve overall stamina Short: workout post [facility]. Long: maintain working out independently. Short: Continue to walk more at home Long: Continue to improve stamina Short: Conitnue to walk at home Long: Continue to improve stamina.    [place] Name 11/18/20 1221 12/01/20 1530           Exercise Goal Re-Evaluation   Exercise Goals Review Increase Strength and Stamina;Increase Physical Activity;Understanding of Exercise Prescription Increase Physical Activity;Increase Strength and Stamina  Comments Halimah is doing well. She is up to 17 laps on the track. O2 sats have been maintained well. Will continue to monitor. Lilienne plasn to go back to [employer] when she graduates.  She improved post walk by 32%      Expected Outcomes Short: Continue to increase laps on track Long: Improve overall MET level Short:  complete LW Lon: maintain exercise on her own               Discharge Exercise Prescription (Final Exercise Prescription Changes):  Exercise Prescription Changes - 12/01/20 1500       Response to Exercise   Blood Pressure (Admit) 122/70    Blood Pressure (Exit) 96/58    Heart Rate (Admit) 62 bpm    Heart Rate (Exercise) 76 bpm    Heart Rate (Exit) 65 bpm    Oxygen Saturation (Admit) 97 %    Oxygen Saturation (Exercise) 95 %    Oxygen Saturation (Exit) 98 %    Rating of Perceived Exertion (Exercise) 12    Perceived Dyspnea (Exercise) 0    Symptoms none    Duration Continue with 30 min of aerobic exercise without signs/symptoms of physical distress.    Intensity THRR unchanged      Progression   Progression Continue to progress workloads to maintain intensity without signs/symptoms of physical distress.    Average METs 2.3      Resistance Training   Training Prescription Yes    Weight 2 lb    Reps 10-15      Interval Training   Interval Training No      NuStep   Level 5    Minutes 15      Track    Laps 16    Minutes 15    METs 1.9      Home Exercise Plan   Plans to continue exercise at Home (comment)   walking, elliptical   Frequency Add 2 additional days to program exercise sessions.    Initial Home Exercises Provided 08/18/20             Nutrition:  Target Goals: Understanding of nutrition guidelines, daily intake of sodium <1551m, cholesterol <2066m calories 30% from fat and 7% or less from saturated fats, daily to have 5 or more servings of fruits and vegetables.  Education: All About Nutrition: -Group instruction provided by verbal, written material, interactive activities, discussions, models, and posters to present general guidelines for heart healthy nutrition including fat, fiber, MyPlate, the role of sodium in heart healthy nutrition, utilization of the nutrition label, and utilization of this knowledge for meal planning. Follow up email sent as well. Written material given at graduation. [facility]  Date 10/15/20  [facility] Code 1- Verbalizes Understanding       Biometrics:  Pre Biometrics - 07/14/20 1450       Pre Biometrics   Height 5' 7.5" (1.715 m)    Weight 165 lb 6.4 oz (75 kg)    BMI (Calculated) 25.51    Single Leg Stand 4.65 seconds             Post Biometrics - 11/19/20 1134        Post  Biometrics   Height 5' 7.5" (1.715 m)    Weight 165 lb 6.4 oz (75 kg)    BMI (Calculated) 25.51  Nutrition Therapy Plan and Nutrition Goals:  Nutrition Therapy & Goals - 08/05/20 1533       Nutrition Therapy   Diet Heart healthy, low Na, diabetes friendly    Drug/Food Interactions Statins/Certain Fruits    Protein (specify units) 60g    Fiber 25 grams    Whole Grain Foods 3 servings    Saturated Fats 12 max. grams    Fruits and Vegetables 8 servings/day    Sodium 1.5 grams      Personal Nutrition Goals   Nutrition Goal ST: practice  counting carbohydrates LT: A1C back to 7    Comments A1C is 8. She is on insulin. B: whole wheat toast (some regular jelly) with [place] bacon and egg - coffee with stevia L: potpie with madarin oranges today D: tossed salad with cheese and processed ham yesterday, today she will have fish (broiled) with sweet potato and slaw. Drinks: water, juice (lemonade and crangrape - 1/2 glass) - with lunch. AM 70, now 128 before eating today - she is normally around ~120. Nozomi reported being very confused with macronutrients as well as diabetes friendly eating. Discussed all macronutrients as well as diabetes friendly eating  - patient voiced understanding, will review at goal time and encouraged attending education. She is limiting her sugar sweetened beverages and is giving up soda for Malena Edman - will use juice as her emergency sugar instead.      Intervention Plan   Intervention Prescribe, educate and counsel regarding individualized specific dietary modifications aiming towards targeted core components such as weight, hypertension, lipid management, diabetes, heart failure and other comorbidities.;Nutrition handout(s) given to patient.    Expected Outcomes Short Term Goal: Understand basic principles of dietary content, such as calories, fat, sodium, cholesterol and nutrients.;Short Term Goal: A plan has been developed with personal nutrition goals set during dietitian appointment.;Long Term Goal: Adherence to prescribed nutrition plan.             Nutrition Assessments:  Nutrition Assessments - 12/01/20 1208       MEDFICTS Scores   Pre Score 65            MEDIFICTS Score Key: ?70 Need to make dietary changes  40-70 Heart Healthy Diet ? 40 Therapeutic Level Cholesterol Diet  Flowsheet Row Pulmonary Rehab [facility] 12/01/2020 in [facility] Pul[facility]  Picture Your Plate Total Score on Admission 56  Picture Your Plate Total Score on Discharge 59      Picture Your Plate Scores: <09  Unhealthy dietary pattern with much room for improvement. 41-50 Dietary pattern unlikely to meet recommendations for good health and room for improvement. 51-60 More healthful dietary pattern, with some room for improvement.  >60 Healthy dietary pattern, although there may be some specific behaviors that could be improved.   Nutrition Goals Re-Evaluation:  Nutrition Goals Re-Evaluation     [place] Name 08/18/20 3267 09/10/20 1107 10/06/20 1124 10/08/20 1106 11/03/20 1126     Goals   Current Weight -- 165 lb (74.8 kg) 163 lb (73.9 kg) -- --   Nutrition Goal -- Keep blood sugar in check. eat a diet that pertains to her needs. ST: practice reflux MNT LT: reduce reflux symptoms ST: practice reflux MNT LT: reduce reflux symptoms   Comment Kadra reports having problems with reflux right now and is taking tums to combat it. It occurs mostly at night and wakes her up - she eats dinner at 6pm and feels reflux at 12am. Today she had reflux when  exercising at 11am - she ate oatmeal at 9am. Suggested that reflux can be cause by a number of things, however, this may be a hunger response - suggested having a small snack before rehab and after dinner to see if this helps at all. She reports she has not chnaged what she was eating - this could also be a stress response. Catia states that her reflex has been 98 percent better. She has been taking medication to help her reflux. She is checking her sugars at home and has been having lower blood sugar checks. Daeja feels like she is eating well and has no questions about her nutrition at this time. Emerly reports continuing issues with reflux, she feels the medication isn't helping much. Discussed reflux MNT: avoiding/limiting caffiene, chocolate, spicy foods, acidic foods, high fat foods, high amounts of added sugar and eating small frequent meals to avoid over-filling stomach. Elany continues to have some issures with reflux on her new medicaiton.  She is staying away from  spicy food and using Splenda instead of sugar.  She is trying to eat smaller meals.  They last epsiode she thinks she ate too late in the evening.   Expected Outcome -- Short: continue to eat food that help keep Blood sugar in check. Long: maintain a heart healthy diet. Short: continue current diet. Long: maintain diet independently. ST: practice reflux MNT LT: reduce reflux symptoms ST: practice reflux MNT LT: reduce reflux symptoms            Nutrition Goals Discharge (Final Nutrition Goals Re-Evaluation):  Nutrition Goals Re-Evaluation - 11/03/20 1126       Goals   Nutrition Goal ST: practice reflux MNT LT: reduce reflux symptoms    Comment Caroline continues to have some issures with reflux on her new medicaiton.  She is staying away from spicy food and using Splenda instead of sugar.  She is trying to eat smaller meals.  They last epsiode she thinks she ate too late in the evening.    Expected Outcome ST: practice reflux MNT LT: reduce reflux symptoms             Psychosocial: Target Goals: Acknowledge presence or absence of significant depression and/or stress, maximize coping skills, provide positive support system. Participant is able to verbalize types and ability to use techniques and skills needed for reducing stress and depression.   Education: Stress, Anxiety, and Depression - Group verbal and visual presentation to define topics covered.  Reviews how body is impacted by stress, anxiety, and depression.  Also discusses healthy ways to reduce stress and to treat/manage anxiety and depression.  Written material given at graduation. Flowsheet Row Pulmonary Rehab from 12/03/2020 in [facility] and Pulmonary Rehab  Date 11/12/20  Educator [facility]  Instruction Review Code 1- [employer] Understanding       Education: Sleep Hygiene -Provides group verbal and written instruction about how sleep can affect your health.  Define sleep hygiene, discuss sleep cycles and impact of sleep  habits. Review good sleep hygiene tips.    Initial Review & Psychosocial Screening:  Initial Psych Review & Screening - 07/06/20 1539       Initial Review   Current issues with Current Sleep Concerns;Current Depression;History of Depression      Family Dynamics   Good Support System? Yes   daughter     Barriers   Psychosocial barriers to participate in program The patient should benefit from training in stress management and relaxation.  Screening Interventions   Interventions Encouraged to exercise;Provide feedback about the scores to participant;To provide support and resources with identified psychosocial needs    Expected Outcomes Short Term goal: Utilizing psychosocial counselor, staff and physician to assist with identification of specific Stressors or current issues interfering with healing process. Setting desired goal for each stressor or current issue identified.;Long Term Goal: Stressors or current issues are controlled or eliminated.;Short Term goal: Identification and review with participant of any Quality of Life or Depression concerns found by scoring the questionnaire.;Long Term goal: The participant improves quality of Life and PHQ9 Scores as seen by post scores and/or verbalization of changes             Quality of Life Scores:  Scores of 19 and below usually indicate a poorer quality of life in these areas.  A difference of  2-3 points is a clinically meaningful difference.  A difference of 2-3 points in the total score of the Quality of Life Index has been associated with significant improvement in overall quality of life, self-image, physical symptoms, and general health in studies assessing change in quality of life.  PHQ-9: Recent Review Flowsheet Data     Depression screen [facility] 2/9 12/01/2020 11/17/2020 11/03/2020 10/06/2020 08/11/2020   Decreased Interest 0 1 0 0 1   Down, Depressed, Hopeless 0 0 0 0 0   PHQ - 2 Score 0 1 0 0 1   Altered sleeping 0 1 0 1 2    Tired, decreased energy _0 Change in appetite 0 0 0 2 0   Feeling bad or failure about yourself  0 0 0 0 0   Trouble concentrating 0 0 0 0 0   Moving slowly or fidgety/restless 0 0 0 0 0   Suicidal thoughts 0 0 0 0 0   PHQ-9 Score _1 Difficult doing work/chores Not difficult at all Not difficult at all Not difficult at all Not difficult at all Not difficult at all      Interpretation of Total Score  Total Score Depression Severity:  1-4 = Minimal depression, 5-9 = Mild depression, 10-14 = Moderate depression, 15-19 = Moderately severe depression, 20-27 = Severe depression   Psychosocial Evaluation and Intervention:  Psychosocial Evaluation - 11/26/20 1128       Psychosocial Evaluation & Interventions   Interventions Encouraged to exercise with the program and follow exercise prescription    Comments Mckinze is not getting as tired as she was when she first started rehab. She can walk farther without her cane. [facility]. She has been more positive with her mood and it is because she is able to be more active.    Continue Psychosocial Services  No Follow up required             Psychosocial Re-Evaluation:  Psychosocial Re-Evaluation     [place] Name 08/11/20 1123 09/10/20 1103 10/06/20 1126 11/03/20 1129       Psychosocial Re-Evaluation   Current issues with History of Depression;Current Sleep Concerns Current Sleep Concerns;Current Stress Concerns Current Stress Concerns;Current Sleep Concerns Current Stress Concerns;Current Sleep Concerns    Comments Reviewed patient health questionnaire (PHQ-9) with patient for follow up. Previously, patients score indicated signs/symptoms of depression.  Reviewed to see if patient is improving symptom wise while in program.  Score declined and patient states that it is because she has little energy. Sometimes  she has trouble sleeping. Her husbands sister has passed away and her Nephew took it  really hard. She had alot of feels and nervousness over her nephew taking it so hard. She felt like she only had three hours of sleep despite taking melatonin. She is going to speak with her doctor about being tired and not sleeping well. She thinks maybe she needs diferent medications. Reviewed patient health questionnaire (PHQ-9) with patient for follow up. Previously, patients score indicated signs/symptoms of depression.  Reviewed to see if patient is improving symptom wise while in program.  Score stayed the same and patient states that it is because she never has energy. She states that she meditates at home and it helps her mood alot. Asyria is sleeping better and feeling good overall. She is is getting out to walk more and do more each day.  Her PHQ has greatly improved!!  She attributes it to feeling better and getting out more. She is already setting up plans to keep this up after graduation.    Expected Outcomes Short: Continue to work toward an improvement in [place] scores by attending [facility] regularly. Long: Continue to improve stress and depression coping skills by talking with staff and attending [facility] regularly and work toward a positive mental state. Short: go to the doctor to talk about medications. Long: get better sleep. Short: Continue to work toward an improvement in [place] scores by attending [facility] regularly. Long: Continue to improve stress and depression coping skills by talking with staff and attending [facility] regularly and work toward a positive mental state. Short: COntinue to exercise for mental boost LOng: COntinue to stay activie    Interventions Encouraged to attend Pulmonary Rehabilitation for the exercise;Relaxation education Encouraged to attend Pulmonary Rehabilitation for the exercise Encouraged to attend Pulmonary Rehabilitation for the exercise Encouraged to attend Pulmonary Rehabilitation for the exercise    Continue Psychosocial Services  Follow up required by  staff Follow up required by staff Follow up required by staff Follow up required by staff         Initial Review        Source of Stress Concerns -- -- -- Chronic Illness;Family            Psychosocial Discharge (Final Psychosocial Re-Evaluation):  Psychosocial Re-Evaluation - 11/03/20 1129       Psychosocial Re-Evaluation   Current issues with Current Stress Concerns;Current Sleep Concerns    Comments Clairissa is sleeping better and feeling good overall. She is is getting out to walk more and do more each day.  Her PHQ has greatly improved!!  She attributes it to feeling better and getting out more. She is already setting up plans to keep this up after graduation.    Expected Outcomes Short: COntinue to exercise for mental boost LOng: COntinue to stay activie    Interventions Encouraged to attend Pulmonary Rehabilitation for the exercise    Continue Psychosocial Services  Follow up required by staff      Initial Review   Source of Stress Concerns Chronic Illness;Family             Education: Education Goals: Education classes will be provided on a weekly basis, covering required topics. Participant will state understanding/return demonstration of topics presented.  Learning Barriers/Preferences:  Learning Barriers/Preferences - 07/06/20 1554       Learning Barriers/Preferences   Learning Barriers None    Learning Preferences None             General Pulmonary  Education Topics:  Infection Prevention: - Provides verbal and written material to individual with discussion of infection control including proper hand washing and proper equipment cleaning during exercise session. Flowsheet Row Pulmonary Rehab from 12/03/2020 in [facility] Cardiac and Pulmonary Rehab  Date 07/14/20  Educator AS  Instruction Review Code 1- Verbalizes Understanding       Falls Prevention: - Provides verbal and written material to individual with discussion of falls prevention and  safety. Flowsheet Row Pulmonary Rehab from 12/03/2020 in [facility] Cardiac and Pulmonary Rehab  Date 07/14/20  Educator AS  Instruction Review Code 1- Verbalizes Understanding       Chronic Lung Disease Review: - Group verbal instruction with posters, models, PowerPoint presentations and videos,  to review new updates, new respiratory medications, new advancements in procedures and treatments. Providing information on websites and "800" numbers for continued self-education. Includes information about supplement oxygen, available portable oxygen systems, continuous and intermittent flow rates, oxygen safety, concentrators, and Medicare reimbursement for oxygen. Explanation of Pulmonary Drugs, including class, frequency, complications, importance of spacers, rinsing mouth after steroid MDI's, and proper cleaning methods for nebulizers. Review of basic lung anatomy and physiology related to function, structure, and complications of lung disease. Review of risk factors. Discussion about methods for diagnosing sleep apnea and types of masks and machines for OSA. Includes a review of the use of types of environmental controls: home humidity, furnaces, filters, dust mite/pet prevention, HEPA vacuums. Discussion about weather changes, air quality and the benefits of nasal washing. Instruction on Warning signs, infection symptoms, calling MD promptly, preventive modes, and value of vaccinations. Review of effective airway clearance, coughing and/or vibration techniques. Emphasizing that all should Create an Action Plan. Written material given at graduation. Flowsheet Row Pulmonary Rehab from 12/03/2020 in [facility] Cardiac and Pulmonary Rehab  Date 11/05/20  Educator [facility]  Instruction Review Code 1- Verbalizes Understanding       AED/CPR: - Group verbal and written instruction with the use of models to demonstrate the basic use of the AED with the basic ABC's of resuscitation.    Anatomy and Cardiac Procedures: -  Group verbal and visual presentation and models provide information about basic cardiac anatomy and function. Reviews the testing methods done to diagnose heart disease and the outcomes of the test results. Describes the treatment choices: Medical Management, Angioplasty, or Coronary Bypass Surgery for treating various heart conditions including Myocardial Infarction, Angina, Valve Disease, and Cardiac Arrhythmias.  Written material given at graduation. Flowsheet Row Pulmonary Rehab from 12/03/2020 in [facility] Cardiac and Pulmonary Rehab  Date 12/03/20  Educator [facility]  Instruction Review Code 1- Verbalizes Understanding       Medication Safety: - Group verbal and visual instruction to review commonly prescribed medications for heart and lung disease. Reviews the medication, class of the drug, and side effects. Includes the steps to properly store meds and maintain the prescription regimen.  Written material given at graduation. Flowsheet Row Pulmonary Rehab from 12/03/2020 in [facility] Cardiac and Pulmonary Rehab  Date 10/22/20  Educator [facility]  Instruction Review Code 1- Verbalizes Understanding       Other: -Provides group and verbal instruction on various topics (see comments)   Knowledge Questionnaire Score:  Knowledge Questionnaire Score - 12/01/20 1212       Knowledge Questionnaire Score   Pre Score 13/18  oxygen    Post Score 13/18              Core Components/Risk Factors/Patient Goals at Admission:  Personal Goals and  Risk Factors at Admission - 07/14/20 1445       Core Components/Risk Factors/Patient Goals on Admission    Weight Management Weight Maintenance;Yes    Intervention Weight Management: Develop a combined nutrition and exercise program designed to reach desired caloric intake, while maintaining appropriate intake of nutrient and fiber, sodium and fats, and appropriate energy expenditure required for the weight goal.;Weight Management: Provide education and appropriate  resources to help participant work on and attain dietary goals.;Weight Management/Obesity: Establish reasonable short term and long term weight goals.    Admit Weight 165 lb 6.4 oz (75 kg)    Goal Weight: Short Term 160 lb (72.6 kg)    Goal Weight: Long Term 160 lb (72.6 kg)    Expected Outcomes Short Term: Continue to assess and modify interventions until short term weight is achieved;Long Term: Adherence to nutrition and physical activity/exercise program aimed toward attainment of established weight goal;Weight Maintenance: Understanding of the daily nutrition guidelines, which includes 25-35% calories from fat, 7% or less cal from saturated fats, less than 253m cholesterol, less than 1.5gm of sodium, & 5 or more servings of fruits and vegetables daily;Understanding recommendations for meals to include 15-35% energy as protein, 25-35% energy from fat, 35-60% energy from carbohydrates, less than 2060mof dietary cholesterol, 20-35 gm of total fiber daily;Understanding of distribution of calorie intake throughout the day with the consumption of 4-5 meals/snacks    Improve shortness of breath with ADL's Yes    Intervention Provide education, individualized exercise plan and daily activity instruction to help decrease symptoms of SOB with activities of daily living.    Expected Outcomes Short Term: Improve cardiorespiratory fitness to achieve a reduction of symptoms when performing ADLs;Long Term: Be able to perform more ADLs without symptoms or delay the onset of symptoms    Diabetes Yes    Intervention Provide education about signs/symptoms and action to take for hypo/hyperglycemia.;Provide education about proper nutrition, including hydration, and aerobic/resistive exercise prescription along with prescribed medications to achieve blood glucose in normal ranges: Fasting glucose 65-99 mg/dL    Expected Outcomes Short Term: Participant verbalizes understanding of the signs/symptoms and immediate care of  hyper/hypoglycemia, proper foot care and importance of medication, aerobic/resistive exercise and nutrition plan for blood glucose control.;Long Term: Attainment of HbA1C < 7%.    Heart Failure Yes    Intervention Provide a combined exercise and nutrition program that is supplemented with education, support and counseling about heart failure. Directed toward relieving symptoms such as shortness of breath, decreased exercise tolerance, and extremity edema.    Expected Outcomes Improve functional capacity of life;Short term: Attendance in program 2-3 days a week with increased exercise capacity. Reported lower sodium intake. Reported increased fruit and vegetable intake. Reports medication compliance.;Short term: Daily weights obtained and reported for increase. Utilizing diuretic protocols set by physician.;Long term: Adoption of self-care skills and reduction of barriers for early signs and symptoms recognition and intervention leading to self-care maintenance.    Hypertension Yes    Intervention Provide education on lifestyle modifcations including regular physical activity/exercise, weight management, moderate sodium restriction and increased consumption of fresh fruit, vegetables, and low fat dairy, alcohol moderation, and smoking cessation.;Monitor prescription use compliance.    Expected Outcomes Short Term: Continued assessment and intervention until BP is < 140/9069mG in hypertensive participants. < 130/108m41m in hypertensive participants with diabetes, heart failure or chronic kidney disease.;Long Term: Maintenance of blood pressure at goal levels.    Lipids Yes    Intervention Provide  education and support for participant on nutrition & aerobic/resistive exercise along with prescribed medications to achieve LDL <21m, HDL >44m    Expected Outcomes Short Term: Participant states understanding of desired cholesterol values and is compliant with medications prescribed. Participant is following  exercise prescription and nutrition guidelines.;Long Term: Cholesterol controlled with medications as prescribed, with individualized exercise RX and with personalized nutrition plan. Value goals: LDL < 7026mHDL > 40 mg.             Education:Diabetes - Individual verbal and written instruction to review signs/symptoms of diabetes, desired ranges of glucose level fasting, after meals and with exercise. Acknowledge that pre and post exercise glucose checks will be done for 3 sessions at entry of program. Flowsheet Row [facility]  Date 07/06/20  [facility]- Verbalizes Understanding       Know Your Numbers and Heart Failure: - Group verbal and visual instruction to discuss disease risk factors for cardiac and pulmonary disease and treatment options.  Reviews associated critical values for Overweight/Obesity, Hypertension, Cholesterol, and Diabetes.  Discusses basics of heart failure: signs/symptoms and treatments.  Introduces Heart Failure Zone chart for action plan for heart failure.  Written material given at graduation. Flowsheet Row [facility]  Date 10/29/20  Educator [facility] Code 1- Verbalizes Understanding       Core Components/Risk Factors/Patient Goals Review:   Goals and Risk Factor Review     Row Name 08/11/20 1119 09/10/20 1109 10/06/20 1127 11/03/20 1124       Core Components/Risk Factors/Patient Goals Review   Personal Goals Review Improve shortness of breath with ADL's Weight Management/Obesity;Diabetes Weight Management/Obesity Weight Management/Obesity;Diabetes;Hypertension;Heart Failure;Improve shortness of breath with ADL's    Review Spoke to patient about their shortness of breath and what they can do to improve. Patient has been informed of breathing techniques when starting the program. Patient is informed to  tell staff if they have had any med changes and that certain meds they are taking or not taking can be causing shortness of breath. PegKaisha doing well in maintaining her weight. She is checking her sugars everyday and is keeping her sugar under control. She is 165 pounds today and wants to maintain. PegRanyahs no current concerns with her weight and is maintaining. She has no questions about her medications. Blood pressure has been stable. PegClaudeen doing well in rehab. Her A1c is down to 5.8!!  She is very pleased with this.  Her breathing is getting better and she is able to do more now.  Her weight is staying steady for the most part.  She denies heart failure symptoms but has noted some heart burn last week.  Her pressures have been good.    Expected Outcomes Short: Attend [facility] regularly to improve shortness of breath with ADL's. Long: maintain independence with ADL's Short: continue to maintain weight. Long: maintain weight independently after [facility]. Short: continue to attened [facility] regularly. Long: Graduate [facility]. Short: Continue to work on breathing Long: COnitnue to manage diabetes.             Core Components/Risk Factors/Patient Goals at Discharge (Final Review):   Goals and Risk Factor Review - 11/03/20 1124       Core Components/Risk Factors/Patient Goals Review   Personal Goals Review Weight Management/Obesity;Diabetes;Hypertension;Heart Failure;Improve shortness of breath with ADL's    Review PegAliyha  doing well in rehab. Her A1c is down to 5.8!!  She is very pleased with this.  Her breathing is getting better and she is able to do more now.  Her weight is staying steady for the most part.  She denies heart failure symptoms but has noted some heart burn last week.  Her pressures have been good.    Expected Outcomes Short: Continue to work on breathing Long: COnitnue to manage diabetes.             ITP Comments:  ITP Comments     Row Name 07/06/20 1536 07/14/20  1452 07/21/20 1123 07/29/20 0614 08/05/20 1532   ITP Comments Initial telephone orientation completed. Diagnosis can be found in 11/12. EP orientation scheduled for 2/8 at 1pm Completed 6MWT and gym orientation. Initial ITP created and sent for review to Dr. Emily Filbert, Medical Director. First full day of exercise!  Patient was oriented to gym and equipment including functions, settings, policies, and procedures.  Patient's individual exercise prescription and treatment plan were reviewed.  All starting workloads were established based on the results of the 6 minute walk test done at initial orientation visit.  The plan for exercise progression was also introduced and progression will be customized based on patient's performance and goals. 30 Day review completed. Medical Director ITP review done, changes made as directed, and signed approval by Medical Director. Completed Initial RD evaluation    Row Name 08/26/20 [phone] 09/23/20 0933 10/21/20 0626 11/18/20 0656 12/03/20 1132   ITP Comments 30 Day review completed. Medical Director ITP review done, changes made as directed, and signed approval by Medical Director. 30 Day review completed. Medical Director ITP review done, changes made as directed, and signed approval by Medical Director. 30 Day review completed. Medical Director ITP review done, changes made as directed, and signed approval by Medical Director. 30 Day review completed. Medical Director ITP review done, changes made as directed, and signed approval by Medical Director. Ronnett graduated today from  rehab with 35 sessions completed.  Details of the patient's exercise prescription and what She needs to do in order to continue the prescription and progress were discussed with patient.  Patient was given a copy of prescription and goals.  Patient verbalized understanding.  Chrishawn plans to continue to exercise by going to [employer].            Comments: discharge ITP

## 2020-12-03 NOTE — Progress Notes (Signed)
Discharge Progress Report  Patient Details  Name: Jamie Murray MRN: [id number] Date of Birth: 01/22/1946 Referring Provider:   Flowsheet Row Pulmonary Rehab from 07/14/2020 in [facility] Cardiac and Pulmonary Rehab  Referring Provider [name]        Number of Visits: 46  Reason for Discharge:  Patient reached a stable level of exercise. Patient independent in their exercise. Patient has met program and personal goals.  Smoking History:  Social History   Tobacco Use  Smoking Status Never  Smokeless Tobacco Never    Diagnosis:  Pulmonary hypertension ([place])  ADL UCSD:  Pulmonary Assessment Scores     Row Name 07/14/20 1440 12/01/20 1210       ADL UCSD   ADL Phase -- Exit    SOB Score total 33 8    Rest 3 0    Walk 3 0    Stairs 5 3    Bath 0 0    Dress 0 0    Shop 0 1         CAT Score      CAT Score 19 4         mMRC Score      mMRC Score 2 --            Initial Exercise Prescription:  Initial Exercise Prescription - 07/14/20 1400       Date of Initial Exercise RX and Referring Provider   Date 07/14/20    Referring Provider [name]      Treadmill   MPH 1.4    Grade 0    Minutes 15    METs 2      Recumbant Bike   Level 1    RPM 60    Minutes 15    METs 2      NuStep   Level 1    SPM 80    Minutes 15    METs 2      Recumbant Elliptical   Level 1    RPM 50    Minutes 15    METs 2      REL-XR   Level 1    Speed 50    Minutes 15    METs 2      Prescription Details   Frequency (times per week) 3    Duration Progress to 30 minutes of continuous aerobic without signs/symptoms of physical distress      Intensity   THRR 40-80% of Max Heartrate 103-132    Ratings of Perceived Exertion 11-13    Perceived Dyspnea 0-4      Progression   Progression Continue to progress workloads to maintain intensity without signs/symptoms of physical distress.      Resistance Training   Training Prescription Yes    Weight 2 lb    Reps  10-15             Discharge Exercise Prescription (Final Exercise Prescription Changes):  Exercise Prescription Changes - 12/01/20 1500       Response to Exercise   Blood Pressure (Admit) 122/70    Blood Pressure (Exit) 96/58    Heart Rate (Admit) 62 bpm    Heart Rate (Exercise) 76 bpm    Heart Rate (Exit) 65 bpm    Oxygen Saturation (Admit) 97 %    Oxygen Saturation (Exercise) 95 %    Oxygen Saturation (Exit) 98 %    Rating of Perceived Exertion (Exercise) 12    Perceived Dyspnea (Exercise) 0  Symptoms none    Duration Continue with 30 min of aerobic exercise without signs/symptoms of physical distress.    Intensity THRR unchanged      Progression   Progression Continue to progress workloads to maintain intensity without signs/symptoms of physical distress.    Average METs 2.3      Resistance Training   Training Prescription Yes    Weight 2 lb    Reps 10-15      Interval Training   Interval Training No      NuStep   Level 5    Minutes 15      Track   Laps 16    Minutes 15    METs 1.9      Home Exercise Plan   Plans to continue exercise at Home (comment)   walking, elliptical   Frequency Add 2 additional days to program exercise sessions.    Initial Home Exercises Provided 08/18/20             Functional Capacity:  6 Minute Walk     Row Name 07/14/20 1427 11/19/20 1131       6 Minute Walk   Phase -- Discharge    Distance 780 feet 1030 feet    Distance % Change -- 32 %    Distance Feet Change -- 250 ft    Walk Time 6 minutes 6 minutes    # of Rest Breaks 0 0    MPH 1.47 1.95    METS 1.9 2.59    RPE 11 11    Perceived Dyspnea  1 0    VO2 Peak 6.63 9.08    Symptoms No No    Resting HR 74 bpm 58 bpm    Resting BP 120/60 118/60    Resting Oxygen Saturation  94 % 99 %    Exercise Oxygen Saturation  during 6 min walk 89 % 95 %    Max Ex. HR 92 bpm 98 bpm    Max Ex. BP 134/74 162/68    2 Minute Post BP 108/64 132/64         Interval HR       1 Minute HR 80 80    2 Minute HR 85 91    3 Minute HR 86 96    4 Minute HR 89 93    5 Minute HR 92 98    6 Minute HR 89 98    2 Minute Post HR 83 83    Interval Heart Rate? Yes --         Interval Oxygen      Interval Oxygen? Yes --    Baseline Oxygen Saturation % 94 % 99 %    1 Minute Oxygen Saturation % 98 % 95 %    1 Minute Liters of Oxygen 0 L 0 L  RA    2 Minute Oxygen Saturation % 96 % 95 %    2 Minute Liters of Oxygen 0 L 0 L    3 Minute Oxygen Saturation % 93 % 97 %    3 Minute Liters of Oxygen 0 L 0 L    4 Minute Oxygen Saturation % 95 % 96 %    4 Minute Liters of Oxygen 0 L 0 L    5 Minute Oxygen Saturation % 89 % 96 %    5 Minute Liters of Oxygen 0 L 0 L    6 Minute Oxygen Saturation % 91 % 96 %  6 Minute Liters of Oxygen 0 L 0 L    2 Minute Post Oxygen Saturation % 96 % 98 %    2 Minute Post Liters of Oxygen 0 L 0 L            Psychological, QOL, Others - Outcomes: PHQ 2/9: Depression screen [facility] 2/9 12/01/2020 11/17/2020 11/03/2020 10/06/2020 08/11/2020  Decreased Interest 0 1 0 0 1  Down, Depressed, Hopeless 0 0 0 0 0  PHQ - 2 Score 0 1 0 0 1  Altered sleeping 0 1 0 1 2  Tired, decreased energy _0 Change in appetite 0 0 0 2 0  Feeling bad or failure about yourself  0 0 0 0 0  Trouble concentrating 0 0 0 0 0  Moving slowly or fidgety/restless 0 0 0 0 0  Suicidal thoughts 0 0 0 0 0  PHQ-9 Score _1 Difficult doing work/chores Not difficult at all Not difficult at all Not difficult at all Not difficult at all Not difficult at all  Some recent data might be hidden    Quality of Life:  Nutrition & Weight - Outcomes:  Pre Biometrics - 07/14/20 1450       Pre Biometrics   Height 5' 7.5" (1.715 m)    Weight 165 lb 6.4 oz (75 kg)    BMI (Calculated) 25.51    Single Leg Stand 4.65 seconds             Post Biometrics - 11/19/20 1134        Post  Biometrics   Height 5' 7.5" (1.715 m)    Weight 165 lb 6.4 oz (75 kg)    BMI  (Calculated) 25.51             Nutrition:  Nutrition Therapy & Goals - 08/05/20 1533       Nutrition Therapy   Diet Heart healthy, low Na, diabetes friendly    Drug/Food Interactions Statins/Certain Fruits    Protein (specify units) 60g    Fiber 25 grams    Whole Grain Foods 3 servings    Saturated Fats 12 max. grams    Fruits and Vegetables 8 servings/day    Sodium 1.5 grams      Personal Nutrition Goals   Nutrition Goal ST: practice counting carbohydrates LT: A1C back to 7    Comments A1C is 8. She is on insulin. B: whole wheat toast (some regular jelly) with [place] bacon and egg - coffee with stevia L: potpie with madarin oranges today D: tossed salad with cheese and processed ham yesterday, today she will have fish (broiled) with sweet potato and slaw. Drinks: water, juice (lemonade and crangrape - 1/2 glass) - with lunch. AM 70, now 128 before eating today - she is normally around ~120. Saliyah reported being very confused with macronutrients as well as diabetes friendly eating. Discussed all macronutrients as well as diabetes friendly eating  - patient voiced understanding, will review at goal time and encouraged attending education. She is limiting her sugar sweetened beverages and is giving up soda for Malena Edman - will use juice as her emergency sugar instead.      Intervention Plan   Intervention Prescribe, educate and counsel regarding individualized specific dietary modifications aiming towards targeted core components such as weight, hypertension, lipid management, diabetes, heart failure and other comorbidities.;Nutrition handout(s) given to patient.    Expected Outcomes Short Term Goal: Understand basic principles of dietary  content, such as calories, fat, sodium, cholesterol and nutrients.;Short Term Goal: A plan has been developed with personal nutrition goals set during dietitian appointment.;Long Term Goal: Adherence to prescribed nutrition plan.             Nutrition  Discharge:  Nutrition Assessments - 12/01/20 1208       MEDFICTS Scores   Pre Score 65             Education Questionnaire Score:  Knowledge Questionnaire Score - 12/01/20 1212       Knowledge Questionnaire Score   Pre Score 13/18  oxygen    Post Score 13/18             Goals reviewed with patient; copy given to patient.

## 2020-12-03 NOTE — Progress Notes (Signed)
Daily Session Note  Patient Details  Name: Jamie Murray MRN: [id number] Date of Birth: 09-16-1945 Referring Provider:   [name] Pulmonary Rehab from 07/14/2020 in [facility] Cardiac and Pulmonary Rehab  Referring Provider [name]       Encounter Date: 12/03/2020  Check In:  Session Check In - 12/03/20 1047       Check-In   Supervising physician immediately available to respond to emergencies See telemetry face sheet for immediately available ER MD    Location [facility]-Cardiac & Pulmonary Rehab    Staff Present Birdie Sons, MPA, RN;Melissa Menahga, RDN, LDN;Meredith Sherryll Burger, RN BSN;Joseph Hood, RCP,RRT,BSRT    Virtual Visit No    Medication changes reported     No    Fall or balance concerns reported    No    Warm-up and Cool-down Performed on first and last piece of equipment    Resistance Training Performed Yes    VAD Patient? No    PAD/SET Patient? No      Pain Assessment   Currently in Pain? No/denies                Social History   Tobacco Use  Smoking Status Never  Smokeless Tobacco Never    Goals Met:  Independence with exercise equipment Exercise tolerated well No report of cardiac concerns or symptoms Strength training completed today  Goals Unmet:  Not Applicable  Comments:  Jamie Murray graduated today from  rehab with 35 sessions completed.  Details of the patient's exercise prescription and what She needs to do in order to continue the prescription and progress were discussed with patient.  Patient was given a copy of prescription and goals.  Patient verbalized understanding.  Jamie Murray plans to continue to exercise by going to [employer].     Dr. Emily Filbert is Medical Director for [place].  Dr. Ottie Glazier is Medical Director for [facility] Pulmonary Rehabilitation.

## 2020-12-03 NOTE — Patient Instructions (Signed)
Discharge Patient Instructions  Patient Details  Name: Jamie Murray MRN: [id number] Date of Birth: February 10, 1946 Referring Provider:  Larey Dresser, MD   Number of Visits: 93  Reason for Discharge:  Patient reached a stable level of exercise. Patient independent in their exercise. Patient has met program and personal goals.  Smoking History:  Social History   Tobacco Use  Smoking Status Never  Smokeless Tobacco Never    Diagnosis:  Pulmonary hypertension ([place])  Initial Exercise Prescription:  Initial Exercise Prescription - 07/14/20 1400       Date of Initial Exercise RX and Referring Provider   Date 07/14/20    Referring Provider [name]      Treadmill   MPH 1.4    Grade 0    Minutes 15    METs 2      Recumbant Bike   Level 1    RPM 60    Minutes 15    METs 2      NuStep   Level 1    SPM 80    Minutes 15    METs 2      Recumbant Elliptical   Level 1    RPM 50    Minutes 15    METs 2      REL-XR   Level 1    Speed 50    Minutes 15    METs 2      Prescription Details   Frequency (times per week) 3    Duration Progress to 30 minutes of continuous aerobic without signs/symptoms of physical distress      Intensity   THRR 40-80% of Max Heartrate 103-132    Ratings of Perceived Exertion 11-13    Perceived Dyspnea 0-4      Progression   Progression Continue to progress workloads to maintain intensity without signs/symptoms of physical distress.      Resistance Training   Training Prescription Yes    Weight 2 lb    Reps 10-15             Discharge Exercise Prescription (Final Exercise Prescription Changes):  Exercise Prescription Changes - 12/01/20 1500       Response to Exercise   Blood Pressure (Admit) 122/70    Blood Pressure (Exit) 96/58    Heart Rate (Admit) 62 bpm    Heart Rate (Exercise) 76 bpm    Heart Rate (Exit) 65 bpm    Oxygen Saturation (Admit) 97 %    Oxygen Saturation (Exercise) 95 %    Oxygen Saturation  (Exit) 98 %    Rating of Perceived Exertion (Exercise) 12    Perceived Dyspnea (Exercise) 0    Symptoms none    Duration Continue with 30 min of aerobic exercise without signs/symptoms of physical distress.    Intensity THRR unchanged      Progression   Progression Continue to progress workloads to maintain intensity without signs/symptoms of physical distress.    Average METs 2.3      Resistance Training   Training Prescription Yes    Weight 2 lb    Reps 10-15      Interval Training   Interval Training No      NuStep   Level 5    Minutes 15      Track   Laps 16    Minutes 15    METs 1.9      Home Exercise Plan   Plans to continue exercise at Home (comment)  walking, elliptical   Frequency Add 2 additional days to program exercise sessions.    Initial Home Exercises Provided 08/18/20             Functional Capacity:  6 Minute Walk     Row Name 07/14/20 1427 11/19/20 1131       6 Minute Walk   Phase -- Discharge    Distance 780 feet 1030 feet    Distance % Change -- 32 %    Distance Feet Change -- 250 ft    Walk Time 6 minutes 6 minutes    # of Rest Breaks 0 0    MPH 1.47 1.95    METS 1.9 2.59    RPE 11 11    Perceived Dyspnea  1 0    VO2 Peak 6.63 9.08    Symptoms No No    Resting HR 74 bpm 58 bpm    Resting BP 120/60 118/60    Resting Oxygen Saturation  94 % 99 %    Exercise Oxygen Saturation  during 6 min walk 89 % 95 %    Max Ex. HR 92 bpm 98 bpm    Max Ex. BP 134/74 162/68    2 Minute Post BP 108/64 132/64         Interval HR      1 Minute HR 80 80    2 Minute HR 85 91    3 Minute HR 86 96    4 Minute HR 89 93    5 Minute HR 92 98    6 Minute HR 89 98    2 Minute Post HR 83 83    Interval Heart Rate? Yes --         Interval Oxygen      Interval Oxygen? Yes --    Baseline Oxygen Saturation % 94 % 99 %    1 Minute Oxygen Saturation % 98 % 95 %    1 Minute Liters of Oxygen 0 L 0 L  RA    2 Minute Oxygen Saturation % 96 % 95 %    2  Minute Liters of Oxygen 0 L 0 L    3 Minute Oxygen Saturation % 93 % 97 %    3 Minute Liters of Oxygen 0 L 0 L    4 Minute Oxygen Saturation % 95 % 96 %    4 Minute Liters of Oxygen 0 L 0 L    5 Minute Oxygen Saturation % 89 % 96 %    5 Minute Liters of Oxygen 0 L 0 L    6 Minute Oxygen Saturation % 91 % 96 %    6 Minute Liters of Oxygen 0 L 0 L    2 Minute Post Oxygen Saturation % 96 % 98 %    2 Minute Post Liters of Oxygen 0 L 0 L            Quality of Life:   Personal Goals: Goals established at orientation with interventions provided to work toward goal.  Personal Goals and Risk Factors at Admission - 07/14/20 1445       Core Components/Risk Factors/Patient Goals on Admission    Weight Management Weight Maintenance;Yes    Intervention Weight Management: Develop a combined nutrition and exercise program designed to reach desired caloric intake, while maintaining appropriate intake of nutrient and fiber, sodium and fats, and appropriate energy expenditure required for the weight goal.;Weight Management: Provide education and appropriate resources to  help participant work on and attain dietary goals.;Weight Management/Obesity: Establish reasonable short term and long term weight goals.    Admit Weight 165 lb 6.4 oz (75 kg)    Goal Weight: Short Term 160 lb (72.6 kg)    Goal Weight: Long Term 160 lb (72.6 kg)    Expected Outcomes Short Term: Continue to assess and modify interventions until short term weight is achieved;Long Term: Adherence to nutrition and physical activity/exercise program aimed toward attainment of established weight goal;Weight Maintenance: Understanding of the daily nutrition guidelines, which includes 25-35% calories from fat, 7% or less cal from saturated fats, less than 218m cholesterol, less than 1.5gm of sodium, & 5 or more servings of fruits and vegetables daily;Understanding recommendations for meals to include 15-35% energy as protein, 25-35% energy from  fat, 35-60% energy from carbohydrates, less than 2022mof dietary cholesterol, 20-35 gm of total fiber daily;Understanding of distribution of calorie intake throughout the day with the consumption of 4-5 meals/snacks    Improve shortness of breath with ADL's Yes    Intervention Provide education, individualized exercise plan and daily activity instruction to help decrease symptoms of SOB with activities of daily living.    Expected Outcomes Short Term: Improve cardiorespiratory fitness to achieve a reduction of symptoms when performing ADLs;Long Term: Be able to perform more ADLs without symptoms or delay the onset of symptoms    Diabetes Yes    Intervention Provide education about signs/symptoms and action to take for hypo/hyperglycemia.;Provide education about proper nutrition, including hydration, and aerobic/resistive exercise prescription along with prescribed medications to achieve blood glucose in normal ranges: Fasting glucose 65-99 mg/dL    Expected Outcomes Short Term: Participant verbalizes understanding of the signs/symptoms and immediate care of hyper/hypoglycemia, proper foot care and importance of medication, aerobic/resistive exercise and nutrition plan for blood glucose control.;Long Term: Attainment of HbA1C < 7%.    Heart Failure Yes    Intervention Provide a combined exercise and nutrition program that is supplemented with education, support and counseling about heart failure. Directed toward relieving symptoms such as shortness of breath, decreased exercise tolerance, and extremity edema.    Expected Outcomes Improve functional capacity of life;Short term: Attendance in program 2-3 days a week with increased exercise capacity. Reported lower sodium intake. Reported increased fruit and vegetable intake. Reports medication compliance.;Short term: Daily weights obtained and reported for increase. Utilizing diuretic protocols set by physician.;Long term: Adoption of self-care skills and  reduction of barriers for early signs and symptoms recognition and intervention leading to self-care maintenance.    Hypertension Yes    Intervention Provide education on lifestyle modifcations including regular physical activity/exercise, weight management, moderate sodium restriction and increased consumption of fresh fruit, vegetables, and low fat dairy, alcohol moderation, and smoking cessation.;Monitor prescription use compliance.    Expected Outcomes Short Term: Continued assessment and intervention until BP is < 140/9041mG in hypertensive participants. < 130/79m51m in hypertensive participants with diabetes, heart failure or chronic kidney disease.;Long Term: Maintenance of blood pressure at goal levels.    Lipids Yes    Intervention Provide education and support for participant on nutrition & aerobic/resistive exercise along with prescribed medications to achieve LDL <70mg59mL >40mg.62mExpected Outcomes Short Term: Participant states understanding of desired cholesterol values and is compliant with medications prescribed. Participant is following exercise prescription and nutrition guidelines.;Long Term: Cholesterol controlled with medications as prescribed, with individualized exercise RX and with personalized nutrition plan. Value goals: LDL < 70mg, 52m> 40  mg.              Personal Goals Discharge:  Goals and Risk Factor Review - 11/03/20 1124       Core Components/Risk Factors/Patient Goals Review   Personal Goals Review Weight Management/Obesity;Diabetes;Hypertension;Heart Failure;Improve shortness of breath with ADL's    Review Sativa is doing well in rehab. Her A1c is down to 5.8!!  She is very pleased with this.  Her breathing is getting better and she is able to do more now.  Her weight is staying steady for the most part.  She denies heart failure symptoms but has noted some heart burn last week.  Her pressures have been good.    Expected Outcomes Short: Continue to work on  breathing Long: COnitnue to manage diabetes.             Exercise Goals and Review:  Exercise Goals     Row Name 07/14/20 1447             Exercise Goals   Increase Physical Activity Yes       Intervention Provide advice, education, support and counseling about physical activity/exercise needs.;Develop an individualized exercise prescription for aerobic and resistive training based on initial evaluation findings, risk stratification, comorbidities and participant's personal goals.       Expected Outcomes Short Term: Attend rehab on a regular basis to increase amount of physical activity.;Long Term: Add in home exercise to make exercise part of routine and to increase amount of physical activity.;Long Term: Exercising regularly at least 3-5 days a week.       Increase Strength and Stamina Yes       Intervention Provide advice, education, support and counseling about physical activity/exercise needs.;Develop an individualized exercise prescription for aerobic and resistive training based on initial evaluation findings, risk stratification, comorbidities and participant's personal goals.       Expected Outcomes Short Term: Increase workloads from initial exercise prescription for resistance, speed, and METs.;Short Term: Perform resistance training exercises routinely during rehab and add in resistance training at home;Long Term: Improve cardiorespiratory fitness, muscular endurance and strength as measured by increased METs and functional capacity (6MWT)       Able to understand and use rate of perceived exertion (RPE) scale Yes       Intervention Provide education and explanation on how to use RPE scale       Expected Outcomes Short Term: Able to use RPE daily in rehab to express subjective intensity level;Long Term:  Able to use RPE to guide intensity level when exercising independently       Able to understand and use Dyspnea scale Yes       Intervention Provide education and explanation  on how to use Dyspnea scale       Expected Outcomes Short Term: Able to use Dyspnea scale daily in rehab to express subjective sense of shortness of breath during exertion;Long Term: Able to use Dyspnea scale to guide intensity level when exercising independently       Knowledge and understanding of Target Heart Rate Range (THRR) Yes       Intervention Provide education and explanation of THRR including how the numbers were predicted and where they are located for reference       Expected Outcomes Short Term: Able to state/look up THRR;Long Term: Able to use THRR to govern intensity when exercising independently;Short Term: Able to use daily as guideline for intensity in rehab       Able to  check pulse independently Yes       Intervention Provide education and demonstration on how to check pulse in carotid and radial arteries.;Review the importance of being able to check your own pulse for safety during independent exercise       Expected Outcomes Short Term: Able to explain why pulse checking is important during independent exercise;Long Term: Able to check pulse independently and accurately       Understanding of Exercise Prescription Yes       Intervention Provide education, explanation, and written materials on patient's individual exercise prescription       Expected Outcomes Short Term: Able to explain program exercise prescription;Long Term: Able to explain home exercise prescription to exercise independently                Exercise Goals Re-Evaluation:  Exercise Goals Re-Evaluation     Row Name 07/21/20 1124 07/29/20 1004 08/11/20 1116 08/18/20 1134 08/25/20 1535     Exercise Goal Re-Evaluation   Exercise Goals Review Able to understand and use rate of perceived exertion (RPE) scale;Able to understand and use Dyspnea scale;Knowledge and understanding of Target Heart Rate Range (THRR);Understanding of Exercise Prescription Increase Physical Activity;Increase Strength and  Stamina;Understanding of Exercise Prescription Increase Physical Activity;Increase Strength and Stamina;Understanding of Exercise Prescription Increase Physical Activity;Increase Strength and Stamina Increase Physical Activity;Increase Strength and Stamina;Understanding of Exercise Prescription   Comments Reviewed RPE and dyspnea scales, THR and program prescription with pt today.  Pt voiced understanding and was given a copy of goals to take home. Miral is off to a good start in rehab.  She can completed her first two full days of exercise thus far.  We will continue to montior her progress. Benedetta uses a recumbant eliptical at home twice a week. She is going to try to go more than 15  minutes on her machine at home. She still needs home exercise and is increases her levels in rehab. Pt able to follow exercise prescription today without complaint.  Will continue to monitor for progression.  Reviewed home exercise with pt today.  Pt plans to use elliptical and walk for exercise.  Reviewed THR, pulse, RPE, sign and symptoms, pulse oximetery and when to call 911 or MD.  Also discussed weather considerations and indoor options.  Pt voiced understanding. Janace is doing well in rehab.  She has missed her last two session due to GI issues and dental surgery.  She is up to 15 watts on the bike.  We will continue to montior her progress.   Expected Outcomes Short: Use RPE daily to regulate intensity. Long: Follow program prescription in THR. Short: Continue to attend rehab regularly Long: Continue to follow program prescription. Short: increase honme exercise. Long: maintain exercise at home independently Short: monitor HR and O2 when exercising at home Long: maintain exercise on her own Short: Improved attendance again Long: Continue to improve stamina    Row Name 09/09/20 1401 09/23/20 1749 10/06/20 1132 10/21/20 1513 11/03/20 1123     Exercise Goal Re-Evaluation   Exercise Goals Review Increase Physical  Activity;Increase Strength and Stamina -- Increase Strength and Stamina Increase Strength and Stamina;Increase Physical Activity;Understanding of Exercise Prescription Increase Strength and Stamina;Increase Physical Activity;Understanding of Exercise Prescription   Comments Rebeca continues to attend consistently.  Oxygen levels stay in the mid 90s during exercise.  Staff will encourage increasing levels/adding incline. Emilyann is ready to increase levels for exercise.  Staff will review THR and how to increase safely. Faizah has  been able to improve her strength and stamina in rehab. She states that she has not been able to do 80 steps per minute on the NuStep but has been able to. Rilda would like to get back to walking on the track.  She says she enjoys walking.  We will continue to encourage her to increase spm and continue to monitor her progress. Kaleea is doing well in rehab.  Today she was able to walk 6 laps without using her cane.  Yesterday she was able to walk 1/4 mile at the park without her cane.  She is feeling better and has more stamina. She is planning to go to the [place] to get set up for exercise for after graduation.  She is nearing graduation already!   Expected Outcomes Short: increase levels Long: increase MET level Short:increase levels on machines Long:  improve overall stamina Short: workout post [facility]. Long: maintain working out independently. Short: Continue to walk more at home Long: Continue to improve stamina Short: Conitnue to walk at home Long: Continue to improve stamina.    Mercedes Name 11/18/20 1221 12/01/20 1530           Exercise Goal Re-Evaluation   Exercise Goals Review Increase Strength and Stamina;Increase Physical Activity;Understanding of Exercise Prescription Increase Physical Activity;Increase Strength and Stamina      Comments Klaire is doing well. She is up to 17 laps on the track. O2 sats have been maintained well. Will continue to monitor. Deamber plasn to  go back to [employer] when she graduates.  She improved post walk by 32%      Expected Outcomes Short: Continue to increase laps on track Long: Improve overall MET level Short:  complete LW Lon: maintain exercise on her own               Nutrition & Weight - Outcomes:  Pre Biometrics - 07/14/20 1450       Pre Biometrics   Height 5' 7.5" (1.715 m)    Weight 165 lb 6.4 oz (75 kg)    BMI (Calculated) 25.51    Single Leg Stand 4.65 seconds             Post Biometrics - 11/19/20 1134        Post  Biometrics   Height 5' 7.5" (1.715 m)    Weight 165 lb 6.4 oz (75 kg)    BMI (Calculated) 25.51             Nutrition:  Nutrition Therapy & Goals - 08/05/20 1533       Nutrition Therapy   Diet Heart healthy, low Na, diabetes friendly    Drug/Food Interactions Statins/Certain Fruits    Protein (specify units) 60g    Fiber 25 grams    Whole Grain Foods 3 servings    Saturated Fats 12 max. grams    Fruits and Vegetables 8 servings/day    Sodium 1.5 grams      Personal Nutrition Goals   Nutrition Goal ST: practice counting carbohydrates LT: A1C back to 7    Comments A1C is 8. She is on insulin. B: whole wheat toast (some regular jelly) with [place] bacon and egg - coffee with stevia L: potpie with madarin oranges today D: tossed salad with cheese and processed ham yesterday, today she will have fish (broiled) with sweet potato and slaw. Drinks: water, juice (lemonade and crangrape - 1/2 glass) - with lunch. AM 70, now 128 before eating today - she  is normally around ~120. Pranika reported being very confused with macronutrients as well as diabetes friendly eating. Discussed all macronutrients as well as diabetes friendly eating  - patient voiced understanding, will review at goal time and encouraged attending education. She is limiting her sugar sweetened beverages and is giving up soda for Malena Edman - will use juice as her emergency sugar instead.      Intervention Plan    Intervention Prescribe, educate and counsel regarding individualized specific dietary modifications aiming towards targeted core components such as weight, hypertension, lipid management, diabetes, heart failure and other comorbidities.;Nutrition handout(s) given to patient.    Expected Outcomes Short Term Goal: Understand basic principles of dietary content, such as calories, fat, sodium, cholesterol and nutrients.;Short Term Goal: A plan has been developed with personal nutrition goals set during dietitian appointment.;Long Term Goal: Adherence to prescribed nutrition plan.             Nutrition Discharge:  Nutrition Assessments - 12/01/20 1208       MEDFICTS Scores   Pre Score 65             Education Questionnaire Score:  Knowledge Questionnaire Score - 12/01/20 1212       Knowledge Questionnaire Score   Pre Score 13/18  oxygen    Post Score 13/18             Goals reviewed with patient; copy given to patient.

## 2020-12-11 ENCOUNTER — Encounter (HOSPITAL_COMMUNITY): Payer: Self-pay | Admitting: Cardiology

## 2020-12-11 ENCOUNTER — Other Ambulatory Visit: Payer: Self-pay

## 2020-12-11 ENCOUNTER — Ambulatory Visit (HOSPITAL_BASED_OUTPATIENT_CLINIC_OR_DEPARTMENT_OTHER)
Admission: RE | Admit: 2020-12-11 | Discharge: 2020-12-11 | Disposition: A | Discharge status: Home / Self Care | Payer: Medicare PPO | Source: Ambulatory Visit | Attending: Cardiology | Admitting: Cardiology

## 2020-12-11 ENCOUNTER — Ambulatory Visit (HOSPITAL_COMMUNITY)
Admission: RE | Admit: 2020-12-11 | Discharge: 2020-12-11 | Disposition: A | Discharge status: Home / Self Care | Payer: Medicare PPO | Source: Ambulatory Visit | Attending: Family | Admitting: Family

## 2020-12-11 VITALS — BP 110/70 | HR 60 | Wt 164.6 lb

## 2020-12-11 DIAGNOSIS — Z794 Long term (current) use of insulin: Secondary | ICD-10-CM | POA: Insufficient documentation

## 2020-12-11 DIAGNOSIS — Z8249 Family history of ischemic heart disease and other diseases of the circulatory system: Secondary | ICD-10-CM | POA: Diagnosis not present

## 2020-12-11 DIAGNOSIS — J984 Other disorders of lung: Secondary | ICD-10-CM | POA: Diagnosis not present

## 2020-12-11 DIAGNOSIS — I700 Atherosclerosis of aorta: Secondary | ICD-10-CM | POA: Insufficient documentation

## 2020-12-11 DIAGNOSIS — I272 Pulmonary hypertension, unspecified: Secondary | ICD-10-CM

## 2020-12-11 DIAGNOSIS — G4733 Obstructive sleep apnea (adult) (pediatric): Secondary | ICD-10-CM | POA: Diagnosis not present

## 2020-12-11 DIAGNOSIS — Z7982 Long term (current) use of aspirin: Secondary | ICD-10-CM | POA: Insufficient documentation

## 2020-12-11 DIAGNOSIS — E1122 Type 2 diabetes mellitus with diabetic chronic kidney disease: Secondary | ICD-10-CM | POA: Insufficient documentation

## 2020-12-11 DIAGNOSIS — I071 Rheumatic tricuspid insufficiency: Secondary | ICD-10-CM | POA: Diagnosis not present

## 2020-12-11 DIAGNOSIS — I129 Hypertensive chronic kidney disease with stage 1 through stage 4 chronic kidney disease, or unspecified chronic kidney disease: Secondary | ICD-10-CM | POA: Diagnosis not present

## 2020-12-11 DIAGNOSIS — N183 Chronic kidney disease, stage 3 unspecified: Secondary | ICD-10-CM | POA: Insufficient documentation

## 2020-12-11 DIAGNOSIS — Z79899 Other long term (current) drug therapy: Secondary | ICD-10-CM | POA: Diagnosis not present

## 2020-12-11 LAB — BASIC METABOLIC PANEL
Anion gap: 11 (ref 5–15)
BUN: 18 mg/dL (ref 8–23)
CO2: 21 mmol/L — ABNORMAL LOW (ref 22–32)
Calcium: 9.2 mg/dL (ref 8.9–10.3)
Chloride: 104 mmol/L (ref 98–111)
Creatinine, Ser: 1.43 mg/dL — ABNORMAL HIGH (ref 0.44–1.00)
GFR, Estimated: 38 mL/min — ABNORMAL LOW (ref >60)
Glucose, Bld: 116 mg/dL — ABNORMAL HIGH (ref 70–99)
Potassium: 3.6 mmol/L (ref 3.5–5.1)
Sodium: 136 mmol/L (ref 135–145)

## 2020-12-11 LAB — ECHOCARDIOGRAM COMPLETE
AR max vel: 1.73 cm2
AV Area VTI: 2.12 cm2
AV Area mean vel: 1.87 cm2
AV Mean grad: 5 mmHg
AV Peak grad: 10.1 mmHg
Ao pk vel: 1.59 m/s
Area-P 1/2: 2 cm2
MV VTI: 2.12 cm2
S' Lateral: 2 cm

## 2020-12-11 LAB — BRAIN NATRIURETIC PEPTIDE: B Natriuretic Peptide: 329.2 pg/mL — ABNORMAL HIGH (ref 0.0–100.0)

## 2020-12-11 MED ORDER — UPTRAVI 1600 MCG PO TABS: ORAL_TABLET | ORAL | 3 refills | Status: DC

## 2020-12-11 NOTE — Progress Notes (Signed)
  Echocardiogram 2D Echocardiogram has been performed.  Jamie Murray 12/11/2020, 11:55 AM

## 2020-12-11 NOTE — Patient Instructions (Signed)
Labs done today. We will contact you only if your labs are abnormal.  Continue to increase Uptravi to 2439mg by mouth 2 times daily.   No other medication changes were made. Please continue all current medications as prescribed.  Your physician recommends that you schedule a follow-up appointment in: 3 months  If you have any questions or concerns before your next appointment please send [place] message through mTonasketor call our office at [phone]    TO LEAVE A MESSAGE FOR THE NURSE SELECT OPTION 2, PLEASE LEAVE A MESSAGE INCLUDING: YOUR NAME DATE OF BIRTH CALL BACK NUMBER REASON FOR CALL**this is important as we prioritize the call backs  YOU WILL RECEIVE A CALL BACK THE SAME DAY AS LONG AS YOU CALL BEFORE 4:00 PM   Do the following things EVERYDAY: Weigh yourself in the morning before breakfast. Write it down and keep it in a log. Take your medicines as prescribed Eat low salt foods--Limit salt (sodium) to 2000 mg per day.  Stay as active as you can everyday Limit all fluids for the day to less than 2 liters   At the [facility] you and your health needs are our priority. As part of our continuing mission to provide you with exceptional heart care, we have created designated Provider Care Teams. These Care Teams include your primary Cardiologist (physician) and Advanced Practice Providers (APPs- Physician Assistants and Nurse Practitioners) who all work together to provide you with the care you need, when you need it.   You may see any of the following providers on your designated Care Team at your next follow up: Dr DGlori BickersDr DHaynes Kerns NP BLyda Jester PUtahLAudry Riles PharmD   Please be sure to bring in all your medications bottles to every appointment.

## 2020-12-13 NOTE — Progress Notes (Signed)
PCP: [name], FNP HF Cardiology: Dr. [name]  75 y.o. with history of HTN, type 2 diabetes, OSA on CPAP, and pulmonary hypertension/RV failure was referred by Dr. Fletcher Anon for evaluation of pulmonary hypertension. It appears that [place] was diagnosed as early as 2018 by echo.  Echo in 4580 showed PA systolic pressure 85 mmHg.  She says that she was told that the pulmonary hypertension was due to OSA.  She has been compliant with CPAP. She reports exertional dyspnea for about 4 years now.   In 8/21, she was admitted to [facility] with CHF.  Echo showed EF 55-60%, mild LVH, D-shaped septum, severe RV dilation with moderate RV systolic dysfunction, PASP 83, moderate TR, moderate PI, dilated IVC.  RHC/LHC showed no coronary disease, severe pulmonary hypertension with PVR 7.38 and preserved cardiac output.  High resolution CT chest in 9/21 showed no evidence for fibrotic ILD.    She saw rheumatology and was given no definite rheumatological diagnosis.    Echo was done today and reviewed, EF 65-70% with moderate LVH, RV moderately dilated with moderately decreased RV systolic function, mild RVH, D-shaped septum, PASP 85 mmHg, moderate PR, normal IVC.   She returns for followup of pulmonary hypertension.  She is only taking Uptravi once a day rather than twice a day.  No dyspnea walking on flat ground.  No chest pain.  No lightheadedness.  Generally feeling good.  Weight down 1 lb.  She has completed pulmonary rehab.      Labs (8/21): K 3.3, creatinine 1.39, ANA negative, anti-Sm negative, anti-SCL-70 negative, ANCA negative, RF elevated 52 but CCP negative, ESR 67, CRP 2.2 Labs (9/21): K 4.5, creatinine 1.8 => 1.45, anti-centromere Ab negative, BNP 309 Labs (11/21): K 4.1, creatinine 1.47, BNP 590 Labs (1/22): LDL 55, K 3.8, creatinine 1.49 Labs (5/22): LDL 74, K 4, creatinine 1.5  6 minute walk (8/21): 154 m 6 minute walk (11/21): 183 m 6 minute walk (3/22): 351 m  PMH: 1. HTN 2. Type 2 diabetes 3.  OSA: Uses CPAP.  4. GERD 5. Hypothyroidism 6. Pulmonary hypertension/RV failure: Pulmonary hypertension diagnosed 2018 by echo.  - PFTs (6/18): moderate restriction - Echo 2020 with PASP 85 mmHg.  - Echo (8/21): EF 55-60%, mild LVH, D-shaped septum, severe RV dilation with moderate RV systolic dysfunction, PASP 83, moderate TR, moderate PI, dilated IVC.  - LHC/RHC (8/21): Normal coronaries; mean RA 3, PA 82/21 mean 44, mean PCWP 14, CI 2.2, PVR 7.38 WU.  - V/Q scan (8/21): No evidence for chronic PE.  - Serologic workup: ANA negative, anti-Sm negative, anti-SCL-70 negative, ANCA negative, RF elevated 52 but CCP negative, ESR 67, CRP 2.2, anti-centromere negative - High resolution CT chest: No fibrotic ILD.  - Echo (7/22): EF 65-70% with moderate LVH, RV moderately dilated with moderately decreased RV systolic function, mild RVH, D-shaped septum, PASP 85 mmHg, moderate PR, normal IVC.  7. CKD stage 3.   Social History   Socioeconomic History   Marital status: Married    Spouse name: Jimmy   Number of children: 3   Years of education: 14   Highest education level: Not on file  Occupational History    Comment: work part time, day care  Tobacco Use   Smoking status: Never   Smokeless tobacco: Never  Vaping Use   Vaping Use: Never used  Substance and Sexual Activity   Alcohol use: No    Alcohol/week: 0.0 standard drinks   Drug use: No   Sexual  activity: Never  Other Topics Concern   Not on file  Social History Narrative   Lives with husband   Caffeine use- coffee 2 cups daily, Coke once a week, green tea daily   Social Determinants of Health   Financial Resource Strain: Not on file  Food Insecurity: Not on file  Transportation Needs: Not on file  Physical Activity: Not on file  Stress: Not on file  Social Connections: Not on file  Intimate Partner Violence: Not on file   Family History  Problem Relation Age of Onset   Diabetes Mother    Hypertension Mother    Stroke  Mother    Heart disease Mother        MI   Cancer Father        pancreatic   Stroke Father    Hypertension Sister    Diabetes Brother    Diabetes Maternal Grandfather    Diabetes Sister    Diabetes Sister    Diabetes Brother    Diabetes Brother    ROS: All systems reviewed and negative except as per HPI.   Current Outpatient Medications  Medication Sig Dispense Refill   ambrisentan (LETAIRIS) 10 MG tablet Take 1 tablet (10 mg total) by mouth daily. 30 tablet 11   amLODipine (NORVASC) 10 MG tablet Take 1 tablet (10 mg total) by mouth daily. 90 tablet 0   aspirin 81 MG tablet Take 81 mg by mouth daily.     clonazePAM (KLONOPIN) 1 MG tablet Take 1 mg by mouth daily.     dapagliflozin propanediol (FARXIGA) 10 MG TABS tablet Take 1 tablet (10 mg total) by mouth daily before breakfast. 90 tablet 3   escitalopram (LEXAPRO) 10 MG tablet Take 10 mg by mouth daily.     esomeprazole (NEXIUM) 40 MG capsule Take 40 mg by mouth daily at 12 noon.     Fe Fum-FA-B Cmp-C-Zn-Mg-Mn-Cu (HEMOCYTE PLUS) 106-1 MG CAPS Take 1 capsule by mouth daily.  6   furosemide (LASIX) 20 MG tablet Take 1 tablet (20 mg total) by mouth every other day. 30 tablet 6   hydrALAZINE (APRESOLINE) 50 MG tablet Take 1 tablet (50 mg total) by mouth every 8 (eight) hours. 90 tablet 0   insulin aspart (NOVOLOG) 100 UNIT/ML FlexPen Inject 4 Units into the skin 3 (three) times daily with meals. (Patient taking differently: Inject 4 Units into the skin 3 (three) times daily with meals. Only for BS greater than 200) 15 mL 0   insulin degludec (TRESIBA) 200 UNIT/ML FlexTouch Pen Inject 26 Units into the skin at bedtime.     levothyroxine (SYNTHROID) 75 MCG tablet Take 75 mcg by mouth daily before breakfast.     loratadine (CLARITIN) 10 MG tablet Take 10 mg by mouth daily.      meclizine (ANTIVERT) 25 MG tablet Take 25 mg by mouth 3 (three) times daily as needed for dizziness.      metoprolol succinate (TOPROL-XL) 50 MG 24 hr tablet Take  50 mg by mouth daily. Take with or immediately following a meal.     Multiple Vitamins-Minerals (MULTI ADULT GUMMIES) CHEW Chew by mouth daily.     potassium chloride (KLOR-CON) 10 MEQ tablet Take 1 tablet (10 mEq total) by mouth every other day. 15 tablet 6   rosuvastatin (CRESTOR) 10 MG tablet Take 10 mg by mouth daily.     tadalafil, PAH, (ADCIRCA) 20 MG tablet Take 2 tablets (40 mg total) by mouth daily. 60 tablet 11  Selexipag (UPTRAVI) 1600 MCG TABS Titrate up to 2480mg by mouth 2 times daily. 270 tablet 3   No current facility-administered medications for this encounter.   BP 110/70   Pulse 60   Wt 74.7 kg (164 lb 9.6 oz)   LMP  (LMP Unknown)   SpO2 100%   BMI 25.40 kg/m  General: NAD Neck: No JVD, no thyromegaly or thyroid nodule.  Lungs: Clear to auscultation bilaterally with normal respiratory effort. CV: Nondisplaced PMI.  Heart regular S1/S2, no S3/S4, no murmur.  No peripheral edema.  No carotid bruit.  Normal pedal pulses.  Abdomen: Soft, nontender, no hepatosplenomegaly, no distention.  Skin: Intact without lesions or rashes.  Neurologic: Alert and oriented x 3.  Psych: Normal affect. Extremities: No clubbing or cyanosis.  HEENT: Normal.   Assessment/Plan: 1. HTN: BP controlled.  - Continue amlodipine.  2. OSA: Continue CPAP.    3. Pulmonary HTN/RV failure: Echo in 8/21 with EF 55-60%, mild LVH, D-shaped septum, severe RV dilation with moderate RV systolic dysfunction, PASP 83, moderate TR, moderate PI, dilated IVC.  Suspect long-standing RV dysfunction based on appearance of echo[place] arterial hypertension, preserved cardiac output. PFTs from 6/18 showed moderate restriction.  V/Q scan from 8/21 showed no evidence for chronic PE. She has treated OSA.  Serologic workup was negative except for elevated CRP and ESR and elevated RF (normal CCP).  High resolution CT chest did not show evidence for ILD. I suspect long-standing group 1 PAH.  Repeat echo today showed EF 65-70% with moderate LVH, RV moderately dilated with moderately decreased RV systolic function, mild RVH, D-shaped septum, PASP 85 mmHg, moderate PR, normal IVC. Though echo still shows significant RV dysfunction, she symptomatically has felt better on PH meds (NYHA class II).  She is not volume overloaded on exam.    - Continue Lasix 20 mg qod, check BMET and BNP.  - Continue ambrisentan 10.  - Continue tadalafil 40 mg daily.   - She needs to go back to taking selexipag 1600 mcg bid rather than qd.  After she is stable on this dose, I would like her to gradually increase selexipag up to 2400 mcg bid.  - Continue CPAP (OSA likely contributes but certainly cannot explain the extent of her [place].  - Continue exercise.  - 6 minute walk at next appointment (as she is not taking selexipag correctly).  4. Pulmonary insufficiency: Moderate on today's echo.    Followup in 3 months   DLoralie Champagne7/03/2021

## 2021-01-12 ENCOUNTER — Telehealth (HOSPITAL_COMMUNITY): Payer: Self-pay | Admitting: Pharmacy Technician

## 2021-01-12 ENCOUNTER — Other Ambulatory Visit (HOSPITAL_COMMUNITY): Payer: Self-pay

## 2021-01-12 NOTE — Telephone Encounter (Signed)
Advanced Heart Failure Patient Advocate Encounter  Received a message from [place] that the patient needs a PA on Ambrisentan. Upon further review, existing PA is already approved through 06/05/21.  Charlann Boxer, CPhT

## 2021-01-14 ENCOUNTER — Other Ambulatory Visit (HOSPITAL_COMMUNITY): Payer: Self-pay

## 2021-01-15 ENCOUNTER — Telehealth (HOSPITAL_COMMUNITY): Payer: Self-pay | Admitting: Pharmacy Technician

## 2021-01-15 NOTE — Telephone Encounter (Signed)
Advanced Heart Failure Patient Advocate Encounter  Received a message from [place] that patient needed a PA for Ambrisentan. Called [facility] as there is an active PA on file, as previously noted.  They sent the wrong communication. The pharmacy has apparently called the patient 10 times, the patient has not filled the medication since May of this year.  I called and spoke with Renee, the patient's daughter and explained what was going on. She is going to follow up with her mom and see which medications that she has and help her get back on track. Provided phone number, [phone] for her to be able to place a refill.   Charlann Boxer, CPhT

## 2021-01-19 ENCOUNTER — Telehealth (HOSPITAL_COMMUNITY): Payer: Self-pay | Admitting: Pharmacy Technician

## 2021-01-19 NOTE — Telephone Encounter (Signed)
Advanced Heart Failure Patient Advocate Encounter  Patient's daughter Magdalene Molly called and requested a renewal of the patient's [employer] [place] grant. Emailed her grant information.   ID [id number]  Group [id number] PCN [id number]  BIN [id number]  Amount: $10,000.00  Charlann Boxer, CPhT

## 2021-01-20 DIAGNOSIS — G4733 Obstructive sleep apnea (adult) (pediatric): Secondary | ICD-10-CM | POA: Diagnosis not present

## 2021-01-20 DIAGNOSIS — I10 Essential (primary) hypertension: Secondary | ICD-10-CM | POA: Diagnosis not present

## 2021-01-20 DIAGNOSIS — E039 Hypothyroidism, unspecified: Secondary | ICD-10-CM | POA: Diagnosis not present

## 2021-01-20 DIAGNOSIS — R0789 Other chest pain: Secondary | ICD-10-CM | POA: Diagnosis not present

## 2021-01-20 DIAGNOSIS — E782 Mixed hyperlipidemia: Secondary | ICD-10-CM | POA: Diagnosis not present

## 2021-01-20 DIAGNOSIS — E559 Vitamin D deficiency, unspecified: Secondary | ICD-10-CM | POA: Diagnosis not present

## 2021-01-20 DIAGNOSIS — E1122 Type 2 diabetes mellitus with diabetic chronic kidney disease: Secondary | ICD-10-CM | POA: Diagnosis not present

## 2021-01-20 DIAGNOSIS — D519 Vitamin B12 deficiency anemia, unspecified: Secondary | ICD-10-CM | POA: Diagnosis not present

## 2021-01-29 DIAGNOSIS — E782 Mixed hyperlipidemia: Secondary | ICD-10-CM | POA: Diagnosis not present

## 2021-01-29 DIAGNOSIS — E1165 Type 2 diabetes mellitus with hyperglycemia: Secondary | ICD-10-CM | POA: Diagnosis not present

## 2021-01-29 DIAGNOSIS — I152 Hypertension secondary to endocrine disorders: Secondary | ICD-10-CM | POA: Diagnosis not present

## 2021-01-29 DIAGNOSIS — E063 Autoimmune thyroiditis: Secondary | ICD-10-CM | POA: Diagnosis not present

## 2021-01-29 DIAGNOSIS — Z794 Long term (current) use of insulin: Secondary | ICD-10-CM | POA: Diagnosis not present

## 2021-01-29 DIAGNOSIS — E1159 Type 2 diabetes mellitus with other circulatory complications: Secondary | ICD-10-CM | POA: Diagnosis not present

## 2021-02-05 ENCOUNTER — Inpatient Hospital Stay: Payer: Medicare PPO

## 2021-02-05 ENCOUNTER — Inpatient Hospital Stay: Payer: Medicare PPO | Admitting: Oncology

## 2021-02-12 ENCOUNTER — Inpatient Hospital Stay: Payer: Medicare PPO

## 2021-02-12 ENCOUNTER — Inpatient Hospital Stay: Payer: Medicare PPO | Admitting: Oncology

## 2021-02-24 ENCOUNTER — Other Ambulatory Visit (HOSPITAL_COMMUNITY): Payer: Self-pay

## 2021-02-24 MED ORDER — AMLODIPINE BESYLATE 10 MG PO TABS: 10.0000 mg | ORAL_TABLET | Freq: Every day | ORAL | 0 refills | Status: DC

## 2021-02-26 ENCOUNTER — Encounter: Payer: Self-pay | Admitting: Oncology

## 2021-02-26 ENCOUNTER — Inpatient Hospital Stay: Payer: Medicare PPO | Attending: Oncology | Admitting: Oncology

## 2021-02-26 ENCOUNTER — Inpatient Hospital Stay: Payer: Medicare PPO

## 2021-02-26 VITALS — BP 129/78 | HR 70 | Resp 17 | Wt 163.0 lb

## 2021-02-26 DIAGNOSIS — Z79899 Other long term (current) drug therapy: Secondary | ICD-10-CM | POA: Insufficient documentation

## 2021-02-26 DIAGNOSIS — Z8249 Family history of ischemic heart disease and other diseases of the circulatory system: Secondary | ICD-10-CM | POA: Insufficient documentation

## 2021-02-26 DIAGNOSIS — Z833 Family history of diabetes mellitus: Secondary | ICD-10-CM | POA: Diagnosis not present

## 2021-02-26 DIAGNOSIS — Z800 Family history of malignant neoplasm of digestive organs: Secondary | ICD-10-CM | POA: Diagnosis not present

## 2021-02-26 DIAGNOSIS — D631 Anemia in chronic kidney disease: Secondary | ICD-10-CM

## 2021-02-26 DIAGNOSIS — R5383 Other fatigue: Secondary | ICD-10-CM | POA: Insufficient documentation

## 2021-02-26 DIAGNOSIS — Z8673 Personal history of transient ischemic attack (TIA), and cerebral infarction without residual deficits: Secondary | ICD-10-CM | POA: Insufficient documentation

## 2021-02-26 DIAGNOSIS — Z823 Family history of stroke: Secondary | ICD-10-CM | POA: Diagnosis not present

## 2021-02-26 DIAGNOSIS — N189 Chronic kidney disease, unspecified: Secondary | ICD-10-CM | POA: Diagnosis not present

## 2021-02-26 LAB — IRON AND TIBC
Iron: 28 ug/dL (ref 28–170)
Saturation Ratios: 10 % — ABNORMAL LOW (ref 10.4–31.8)
TIBC: 287 ug/dL (ref 250–450)
UIBC: 259 ug/dL

## 2021-02-26 LAB — COMPREHENSIVE METABOLIC PANEL
ALT: 18 U/L (ref 0–44)
AST: 24 U/L (ref 15–41)
Albumin: 3.9 g/dL (ref 3.5–5.0)
Alkaline Phosphatase: 64 U/L (ref 38–126)
Anion gap: 10 (ref 5–15)
BUN: 20 mg/dL (ref 8–23)
CO2: 26 mmol/L (ref 22–32)
Calcium: 9.4 mg/dL (ref 8.9–10.3)
Chloride: 105 mmol/L (ref 98–111)
Creatinine, Ser: 1.43 mg/dL — ABNORMAL HIGH (ref 0.44–1.00)
GFR, Estimated: 38 mL/min — ABNORMAL LOW (ref >60)
Glucose, Bld: 79 mg/dL (ref 70–99)
Potassium: 3.8 mmol/L (ref 3.5–5.1)
Sodium: 141 mmol/L (ref 135–145)
Total Bilirubin: 0.5 mg/dL (ref 0.3–1.2)
Total Protein: 7.7 g/dL (ref 6.5–8.1)

## 2021-02-26 LAB — CBC WITH DIFFERENTIAL/PLATELET
Abs Immature Granulocytes: 0.01 10*3/uL (ref 0.00–0.07)
Basophils Absolute: 0 10*3/uL (ref 0.0–0.1)
Basophils Relative: 0 %
Eosinophils Absolute: 0.1 10*3/uL (ref 0.0–0.5)
Eosinophils Relative: 2 %
HCT: 31.4 % — ABNORMAL LOW (ref 36.0–46.0)
Hemoglobin: 9.7 g/dL — ABNORMAL LOW (ref 12.0–15.0)
Immature Granulocytes: 0 %
Lymphocytes Relative: 27 %
Lymphs Abs: 1 10*3/uL (ref 0.7–4.0)
MCH: 26.6 pg (ref 26.0–34.0)
MCHC: 30.9 g/dL (ref 30.0–36.0)
MCV: 86.3 fL (ref 80.0–100.0)
Monocytes Absolute: 0.2 10*3/uL (ref 0.1–1.0)
Monocytes Relative: 5 %
Neutro Abs: 2.4 10*3/uL (ref 1.7–7.7)
Neutrophils Relative %: 66 %
Platelets: 148 10*3/uL — ABNORMAL LOW (ref 150–400)
RBC: 3.64 MIL/uL — ABNORMAL LOW (ref 3.87–5.11)
RDW: 17.6 % — ABNORMAL HIGH (ref 11.5–15.5)
WBC: 3.7 10*3/uL — ABNORMAL LOW (ref 4.0–10.5)
nRBC: 0 % (ref 0.0–0.2)

## 2021-02-26 LAB — TECHNOLOGIST SMEAR REVIEW: Plt Morphology: ADEQUATE

## 2021-02-26 LAB — RETIC PANEL
Immature Retic Fract: 12.4 % (ref 2.3–15.9)
RBC.: 3.72 MIL/uL — ABNORMAL LOW (ref 3.87–5.11)
Retic Count, Absolute: 65.5 10*3/uL (ref 19.0–186.0)
Retic Ct Pct: 1.8 % (ref 0.4–3.1)
Reticulocyte Hemoglobin: 29.3 pg (ref >27.9)

## 2021-02-26 LAB — FERRITIN: Ferritin: 28 ng/mL (ref 11–307)

## 2021-02-26 NOTE — Progress Notes (Signed)
Patient here for initial oncology appointment, expresses concerns of fatigue

## 2021-02-26 NOTE — Progress Notes (Signed)
Hematology/Oncology Consult note [facility] [phone] [phone]   Patient Care Team: [name], FNP as PCP - General (Family Medicine) Dionisio David, MD as Consulting Physician (Cardiology)  REFERRING PROVIDER: [name], FNP  CHIEF COMPLAINTS/REASON FOR VISIT:  Evaluation of anmeia  HISTORY OF PRESENTING ILLNESS:   Jamie Murray is a  75 y.o.  female with [place] listed below was seen in consultation at the request of  [name], FNP  for evaluation of anemia   11/11/2020 cbc showed hemoglobin 9.3, mcv 85.4 Wbc 3.3, platelet count 132 Anemia is chronic, since at least 2017 Patient has chronic kidney disease following nephrology.  She report feeling fatigued. No bloody or black stool, no abdominal pain, unintentional weight loss, fever, night sweats.  She takes Hemocyte plus.   Review of Systems  Constitutional:  Positive for fatigue. Negative for appetite change, chills and fever.  HENT:   Negative for hearing loss and voice change.   Eyes:  Negative for eye problems.  Respiratory:  Negative for chest tightness and cough.   Cardiovascular:  Negative for chest pain.  Gastrointestinal:  Negative for abdominal distention, abdominal pain and blood in stool.  Endocrine: Negative for hot flashes.  Genitourinary:  Negative for difficulty urinating and frequency.   Musculoskeletal:  Negative for arthralgias.  Skin:  Negative for itching and rash.  Neurological:  Negative for extremity weakness.  Hematological:  Negative for adenopathy.  Psychiatric/Behavioral:  Negative for confusion.    MEDICAL HISTORY:  Past Medical History:  Diagnosis Date   Allergy    Anemia    Anxiety    COPD (chronic obstructive pulmonary disease) (HCC)    Depression    Diabetes (HCC)    GERD (gastroesophageal reflux disease)    Hyperlipidemia    Hypertension    Insomnia    Migraines    Osteoporosis    [place]  spotted fever    Transient cerebral ischemia    Vertigo    every 2-3 months    SURGICAL HISTORY: Past Surgical History:  Procedure Laterality Date   ABDOMINAL HYSTERECTOMY     CORONARY ANGIOPLASTY     ESOPHAGOGASTRODUODENOSCOPY (EGD) WITH PROPOFOL N/A 08/24/2015   Procedure: ESOPHAGOGASTRODUODENOSCOPY (EGD) WITH PROPOFOL with dialation;  Surgeon: Lucilla Lame, MD;  Location: [facility];  Service: Endoscopy;  Laterality: N/A;  Diabetic - oral meds   RIGHT/LEFT HEART CATH AND CORONARY/GRAFT ANGIOGRAPHY N/A 01/13/2020   Procedure: RIGHT/LEFT HEART CATH AND CORONARY/GRAFT ANGIOGRAPHY;  Surgeon: Wellington Hampshire, MD;  Location: [facility];  Service: Cardiovascular;  Laterality: N/A;   TUBAL LIGATION      SOCIAL HISTORY: Social History   Socioeconomic History   Marital status: Married    Spouse name: Jimmy   Number of children: 3   Years of education: 14   Highest education level: Not on file  Occupational History    Comment: work part time, day care  Tobacco Use   Smoking status: Never   Smokeless tobacco: Never  Vaping Use   Vaping Use: Never used  Substance and Sexual Activity   Alcohol use: No    Alcohol/week: 0.0 standard drinks   Drug use: No   Sexual activity: Never  Other Topics Concern   Not on file  Social History Narrative   Lives with husband   Caffeine use- coffee 2 cups daily, Coke once a week, green tea daily   Social Determinants of Radio broadcast assistant  Strain: Not on file  Food Insecurity: Not on file  Transportation Needs: Not on file  Physical Activity: Not on file  Stress: Not on file  Social Connections: Not on file  Intimate Partner Violence: Not on file    FAMILY HISTORY: Family History  Problem Relation Age of Onset   Diabetes Mother    Hypertension Mother    Stroke Mother    Heart disease Mother        MI   Cancer Father        pancreatic   Stroke Father    Hypertension Sister    Diabetes Brother     Diabetes Maternal Grandfather    Diabetes Sister    Diabetes Sister    Diabetes Brother    Diabetes Brother     ALLERGIES:  is allergic to atorvastatin, fluoxetine, levemir [insulin detemir], and prozac [fluoxetine hcl].  MEDICATIONS:  Current Outpatient Medications  Medication Sig Dispense Refill   ambrisentan (LETAIRIS) 10 MG tablet Take 1 tablet (10 mg total) by mouth daily. 30 tablet 11   amLODipine (NORVASC) 10 MG tablet Take 1 tablet (10 mg total) by mouth daily. 90 tablet 0   aspirin 81 MG tablet Take 81 mg by mouth daily.     clonazePAM (KLONOPIN) 1 MG tablet Take 1 mg by mouth daily.     dapagliflozin propanediol (FARXIGA) 10 MG TABS tablet Take 1 tablet (10 mg total) by mouth daily before breakfast. 90 tablet 3   escitalopram (LEXAPRO) 10 MG tablet Take 10 mg by mouth daily.     esomeprazole (NEXIUM) 40 MG capsule Take 40 mg by mouth daily at 12 noon.     Fe Fum-FA-B Cmp-C-Zn-Mg-Mn-Cu (HEMOCYTE PLUS) 106-1 MG CAPS Take 1 capsule by mouth daily.  6   furosemide (LASIX) 20 MG tablet Take 1 tablet (20 mg total) by mouth every other day. 30 tablet 6   hydrALAZINE (APRESOLINE) 50 MG tablet Take 1 tablet (50 mg total) by mouth every 8 (eight) hours. 90 tablet 0   hydrALAZINE (APRESOLINE) 50 MG tablet Take by mouth.     insulin aspart (NOVOLOG) 100 UNIT/ML FlexPen Inject 4 Units into the skin 3 (three) times daily with meals. (Patient taking differently: Inject 4 Units into the skin 3 (three) times daily with meals. Only for BS greater than 200) 15 mL 0   insulin degludec (TRESIBA) 200 UNIT/ML FlexTouch Pen Inject 26 Units into the skin at bedtime.     Insulin Pen Needle (B-D UF III MINI PEN NEEDLES) 31G X 5 MM MISC 4 (four) times daily     levothyroxine (SYNTHROID) 75 MCG tablet Take 75 mcg by mouth daily before breakfast.     loratadine (CLARITIN) 10 MG tablet Take 10 mg by mouth daily.      meclizine (ANTIVERT) 25 MG tablet Take 25 mg by mouth 3 (three) times daily as needed for  dizziness.      metoprolol succinate (TOPROL-XL) 50 MG 24 hr tablet Take 50 mg by mouth daily. Take with or immediately following a meal.     Multiple Vitamins-Minerals (MULTI ADULT GUMMIES) CHEW Chew by mouth daily.     nitroGLYCERIN (NITROSTAT) 0.4 MG SL tablet Nitroglycerin 0.4 MG Sublingual Tablet Sublingual QTY: 25 tablet Days: 30 Refills: 0  Written: 11/15/19 Patient Instructions: Take under tongue for chest pain. may take three doses 5 minutes apart     potassium chloride (KLOR-CON) 10 MEQ tablet Take 1 tablet (10 mEq total) by mouth every other day.  15 tablet 6   rosuvastatin (CRESTOR) 10 MG tablet Take 10 mg by mouth daily.     Selexipag (UPTRAVI) 1600 MCG TABS Titrate up to 2413mg by mouth 2 times daily. 270 tablet 3   SELEXIPAG PO Take by mouth.     Semaglutide,0.25 or 0.5MG/DOS, (OZEMPIC, 0.25 OR 0.5 MG/DOSE,) 2 MG/1.5ML SOPN Ozempic (0.25 or 0.5 MG/DOSE) 2 MG/1.5ML Subcutaneous Solution Pen-injector QTY: 1.5  Days: 28 Refills: 0  Written: 08/12/20 Patient Instructions:     tadalafil (CIALIS) 20 MG tablet Tadalafil (PAH) 20 MG Oral Tablet QTY: 0 tablet Days: 0 Refills: 0  Written: 04/13/20 Patient Instructions:     tadalafil, PAH, (ADCIRCA) 20 MG tablet Take 2 tablets (40 mg total) by mouth daily. 60 tablet 11   zaleplon (SONATA) 10 MG capsule Take by mouth.     No current facility-administered medications for this visit.     PHYSICAL EXAMINATION: ECOG PERFORMANCE STATUS: 1 - Symptomatic but completely ambulatory Vitals:   02/26/21 1255  BP: 129/78  Pulse: 70  Resp: 17  SpO2: 98%   Filed Weights   02/26/21 1255  Weight: 163 lb (73.9 kg)    Physical Exam Constitutional:      General: She is not in acute distress. HENT:     Head: Normocephalic and atraumatic.  Eyes:     General: No scleral icterus. Cardiovascular:     Rate and Rhythm: Normal rate and regular rhythm.     Heart sounds: Normal heart sounds.  Pulmonary:     Effort: Pulmonary effort is normal. No  respiratory distress.     Breath sounds: No wheezing.  Abdominal:     General: Bowel sounds are normal. There is no distension.     Palpations: Abdomen is soft.  Musculoskeletal:        General: No deformity. Normal range of motion.     Cervical back: Normal range of motion and neck supple.  Skin:    General: Skin is warm and dry.     Findings: No erythema or rash.  Neurological:     Mental Status: She is alert and oriented to person, place, and time. Mental status is at baseline.     Cranial Nerves: No cranial nerve deficit.     Coordination: Coordination normal.  Psychiatric:        Mood and Affect: Mood normal.    LABORATORY DATA:  I have reviewed the data as listed Lab Results  Component Value Date   WBC 3.7 (L) 02/26/2021   HGB 9.7 (L) 02/26/2021   HCT 31.4 (L) 02/26/2021   MCV 86.3 02/26/2021   PLT 148 (L) 02/26/2021   Recent Labs    03/03/20 1051 04/17/20 1246 09/11/20 1228 12/11/20 1235 02/26/21 1313  NA 141   < > 139 136 141  K 3.9   < > 3.7 3.6 3.8  CL 114*   < > 107 104 105  CO2 22   < > 25 21* 26  GLUCOSE 116*   < > 125* 116* 79  BUN 25*   < > _0 CREATININE 1.45*   < > 1.51* 1.43* 1.43*  CALCIUM 9.6   < > 9.1 9.2 9.4  GFRNONAA 36*   < > 36* 38* 38*  GFRAA 41*  --   --   --   --   PROT  --   --   --   --  7.7  ALBUMIN  --   --   --   --  3.9  AST  --   --   --   --  24  ALT  --   --   --   --  18  ALKPHOS  --   --   --   --  64  BILITOT  --   --   --   --  0.5   < > = values in this interval not displayed.   Iron/TIBC/Ferritin/ %Sat    Component Value Date/Time   IRON 28 02/26/2021 1313   IRON 38 07/14/2016 1140   TIBC 287 02/26/2021 1313   TIBC 302 07/14/2016 1140   FERRITIN 28 02/26/2021 1313   FERRITIN 32 07/14/2016 1140   IRONPCTSAT 10 (L) 02/26/2021 1313   IRONPCTSAT 13 (L) 07/14/2016 1140      RADIOGRAPHIC STUDIES: I have personally reviewed the radiological images as listed and agreed with the findings in the report. No  results found.    ASSESSMENT & PLAN:  1. Anemia due to chronic kidney disease, unspecified CKD stage    Labs are reviewed and discussed with patient. Chronic anemia, likely due to CKD, rule out other etiologist.  Check cbc, cmp, iron tibc ferritin, retic panel, mutliple myeloma panel light chain ratio.   Orders Placed This Encounter  Procedures   Iron and TIBC    Standing Status:   Future    Number of Occurrences:   1    Standing Expiration Date:   02/26/2022   Ferritin    Standing Status:   Future    Number of Occurrences:   1    Standing Expiration Date:   08/26/2021   Technologist smear review    Standing Status:   Future    Number of Occurrences:   1    Standing Expiration Date:   02/26/2022   Comprehensive metabolic panel    Standing Status:   Future    Number of Occurrences:   1    Standing Expiration Date:   02/26/2022   CBC with Differential/Platelet    Standing Status:   Future    Number of Occurrences:   1    Standing Expiration Date:   02/26/2022   Retic Panel    Standing Status:   Future    Number of Occurrences:   1    Standing Expiration Date:   02/26/2022   Multiple Myeloma Panel (SPEP&IFE w/QIG)    Standing Status:   Future    Number of Occurrences:   1    Standing Expiration Date:   02/26/2022   Kappa/lambda light chains    Standing Status:   Future    Number of Occurrences:   1    Standing Expiration Date:   02/26/2022    All questions were answered. The patient knows to call the clinic with any problems questions or concerns.   [name], FNP    Return of visit:  Thank you for this kind referral and the opportunity to participate in the care of this patient. A copy of today's note is routed to referring provider    Earlie Server, MD, PhD Hematology Oncology [place] at [facility]  02/26/2021

## 2021-03-01 LAB — KAPPA/LAMBDA LIGHT CHAINS
Kappa free light chain: 52.9 mg/L — ABNORMAL HIGH (ref 3.3–19.4)
Kappa, lambda light chain ratio: 3.46 — ABNORMAL HIGH (ref 0.26–1.65)
Lambda free light chains: 15.3 mg/L (ref 5.7–26.3)

## 2021-03-02 LAB — MULTIPLE MYELOMA PANEL, SERUM
Albumin SerPl Elph-Mcnc: 3.5 g/dL (ref 2.9–4.4)
Albumin/Glob SerPl: 1.1 (ref 0.7–1.7)
Alpha 1: 0.3 g/dL (ref 0.0–0.4)
Alpha2 Glob SerPl Elph-Mcnc: 0.9 g/dL (ref 0.4–1.0)
B-Globulin SerPl Elph-Mcnc: 0.9 g/dL (ref 0.7–1.3)
Gamma Glob SerPl Elph-Mcnc: 1.2 g/dL (ref 0.4–1.8)
Globulin, Total: 3.2 g/dL (ref 2.2–3.9)
IgA: 57 mg/dL — ABNORMAL LOW (ref 64–422)
IgG (Immunoglobin G), Serum: 753 mg/dL (ref 586–1602)
IgM (Immunoglobulin M), Srm: 715 mg/dL — ABNORMAL HIGH (ref 26–217)
M Protein SerPl Elph-Mcnc: 0.7 g/dL — ABNORMAL HIGH
Total Protein ELP: 6.7 g/dL (ref 6.0–8.5)

## 2021-03-08 ENCOUNTER — Other Ambulatory Visit (HOSPITAL_COMMUNITY): Payer: Self-pay

## 2021-03-08 MED ORDER — AMBRISENTAN 10 MG PO TABS: 10.0000 mg | ORAL_TABLET | Freq: Every day | ORAL | 3 refills | Status: DC

## 2021-03-11 ENCOUNTER — Other Ambulatory Visit: Payer: Self-pay

## 2021-03-11 ENCOUNTER — Inpatient Hospital Stay: Payer: Medicare PPO | Attending: Oncology | Admitting: Oncology

## 2021-03-11 ENCOUNTER — Encounter: Payer: Self-pay | Admitting: Oncology

## 2021-03-11 VITALS — BP 141/77 | HR 54 | Temp 97.5°F | Resp 16 | Wt 168.0 lb

## 2021-03-11 DIAGNOSIS — D631 Anemia in chronic kidney disease: Secondary | ICD-10-CM | POA: Insufficient documentation

## 2021-03-11 DIAGNOSIS — Z79899 Other long term (current) drug therapy: Secondary | ICD-10-CM | POA: Insufficient documentation

## 2021-03-11 DIAGNOSIS — D472 Monoclonal gammopathy: Secondary | ICD-10-CM | POA: Diagnosis not present

## 2021-03-11 DIAGNOSIS — N1832 Chronic kidney disease, stage 3b: Secondary | ICD-10-CM | POA: Diagnosis not present

## 2021-03-11 DIAGNOSIS — N189 Chronic kidney disease, unspecified: Secondary | ICD-10-CM | POA: Insufficient documentation

## 2021-03-11 NOTE — Progress Notes (Signed)
Patient here for oncology follow-up appointment, expresses no complaints or concerns at this time.

## 2021-03-11 NOTE — Progress Notes (Signed)
Hematology/Oncology progress note [facility] Telephone:([phone] [phone]   Patient Care Team: [name], FNP as PCP - General (Family Medicine) Dionisio David, MD as Consulting Physician (Cardiology)  REFERRING PROVIDER: [name], FNP  CHIEF COMPLAINTS/REASON FOR VISIT:  Follow up for anemia  HISTORY OF PRESENTING ILLNESS:   Jamie Murray is a  75 y.o.  female with PMH listed below was seen in consultation at the request of  [name], FNP  for evaluation of anemia   11/11/2020 cbc showed hemoglobin 9.3, mcv 85.4 Wbc 3.3, platelet count 132 Anemia is chronic, since at least 2017 Patient has chronic kidney disease following nephrology.  She report feeling fatigued. No bloody or black stool, no abdominal pain, unintentional weight loss, fever, night sweats.  She takes Hemocyte plus.    INTERVAL HISTORY Jamie Murray is a 75 y.o. female who has above history reviewed by me today presents for follow up visit for anemia No new complaints. She presents to discuss results.    Review of Systems  Constitutional:  Positive for fatigue. Negative for appetite change, chills and fever.  HENT:   Negative for hearing loss and voice change.   Eyes:  Negative for eye problems.  Respiratory:  Negative for chest tightness and cough.   Cardiovascular:  Negative for chest pain.  Gastrointestinal:  Negative for abdominal distention, abdominal pain and blood in stool.  Endocrine: Negative for hot flashes.  Genitourinary:  Negative for difficulty urinating and frequency.   Musculoskeletal:  Negative for arthralgias.  Skin:  Negative for itching and rash.  Neurological:  Negative for extremity weakness.  Hematological:  Negative for adenopathy.  Psychiatric/Behavioral:  Negative for confusion.    MEDICAL HISTORY:  Past Medical History:  Diagnosis Date   Allergy    Anemia    Anxiety    COPD (chronic obstructive  pulmonary disease) (HCC)    Depression    Diabetes (HCC)    GERD (gastroesophageal reflux disease)    Hyperlipidemia    Hypertension    Insomnia    Migraines    Osteoporosis    Rocky Mountain spotted fever    Transient cerebral ischemia    Vertigo    every 2-3 months    SURGICAL HISTORY: Past Surgical History:  Procedure Laterality Date   ABDOMINAL HYSTERECTOMY     CORONARY ANGIOPLASTY     ESOPHAGOGASTRODUODENOSCOPY (EGD) WITH PROPOFOL N/A 08/24/2015   Procedure: ESOPHAGOGASTRODUODENOSCOPY (EGD) WITH PROPOFOL with dialation;  Surgeon: Lucilla Lame, MD;  Location: [facility];  Service: Endoscopy;  Laterality: N/A;  Diabetic - oral meds   RIGHT/LEFT HEART CATH AND CORONARY/GRAFT ANGIOGRAPHY N/A 01/13/2020   Procedure: RIGHT/LEFT HEART CATH AND CORONARY/GRAFT ANGIOGRAPHY;  Surgeon: Wellington Hampshire, MD;  Location: [facility];  Service: Cardiovascular;  Laterality: N/A;   TUBAL LIGATION      SOCIAL HISTORY: Social History   Socioeconomic History   Marital status: Married    Spouse name: Jimmy   Number of children: 3   Years of education: 14   Highest education level: Not on file  Occupational History    Comment: work part time, day care  Tobacco Use   Smoking status: Never   Smokeless tobacco: Never  Vaping Use   Vaping Use: Never used  Substance and Sexual Activity   Alcohol use: No    Alcohol/week: 0.0 standard drinks   Drug use: No   Sexual activity: Never  Other Topics Concern  Not on file  Social History Narrative   Lives with husband   Caffeine use- coffee 2 cups daily, Coke once a week, green tea daily   Social Determinants of Health   Financial Resource Strain: Not on file  Food Insecurity: Not on file  Transportation Needs: Not on file  Physical Activity: Not on file  Stress: Not on file  Social Connections: Not on file  Intimate Partner Violence: Not on file    FAMILY HISTORY: Family History  Problem Relation Age of Onset    Diabetes Mother    Hypertension Mother    Stroke Mother    Heart disease Mother        MI   Cancer Father        pancreatic   Stroke Father    Hypertension Sister    Diabetes Brother    Diabetes Maternal Grandfather    Diabetes Sister    Diabetes Sister    Diabetes Brother    Diabetes Brother     ALLERGIES:  is allergic to atorvastatin, fluoxetine, levemir [insulin detemir], and prozac [fluoxetine hcl].  MEDICATIONS:  Current Outpatient Medications  Medication Sig Dispense Refill   ambrisentan (LETAIRIS) 10 MG tablet Take 1 tablet (10 mg total) by mouth daily. 30 tablet 3   amLODipine (NORVASC) 10 MG tablet Take 1 tablet (10 mg total) by mouth daily. 90 tablet 0   aspirin 81 MG tablet Take 81 mg by mouth daily.     clonazePAM (KLONOPIN) 1 MG tablet Take 1 mg by mouth daily.     dapagliflozin propanediol (FARXIGA) 10 MG TABS tablet Take 1 tablet (10 mg total) by mouth daily before breakfast. 90 tablet 3   escitalopram (LEXAPRO) 10 MG tablet Take 10 mg by mouth daily.     esomeprazole (NEXIUM) 40 MG capsule Take 40 mg by mouth daily at 12 noon.     Fe Fum-FA-B Cmp-C-Zn-Mg-Mn-Cu (HEMOCYTE PLUS) 106-1 MG CAPS Take 1 capsule by mouth daily.  6   furosemide (LASIX) 20 MG tablet Take 1 tablet (20 mg total) by mouth every other day. 30 tablet 6   hydrALAZINE (APRESOLINE) 50 MG tablet Take 1 tablet (50 mg total) by mouth every 8 (eight) hours. 90 tablet 0   hydrALAZINE (APRESOLINE) 50 MG tablet Take by mouth.     insulin aspart (NOVOLOG) 100 UNIT/ML FlexPen Inject 4 Units into the skin 3 (three) times daily with meals. (Patient taking differently: Inject 4 Units into the skin 3 (three) times daily with meals. Only for BS greater than 200) 15 mL 0   insulin degludec (TRESIBA) 200 UNIT/ML FlexTouch Pen Inject 26 Units into the skin at bedtime.     Insulin Pen Needle (B-D UF III MINI PEN NEEDLES) 31G X 5 MM MISC 4 (four) times daily     levothyroxine (SYNTHROID) 75 MCG tablet Take 75 mcg by  mouth daily before breakfast.     loratadine (CLARITIN) 10 MG tablet Take 10 mg by mouth daily.      meclizine (ANTIVERT) 25 MG tablet Take 25 mg by mouth 3 (three) times daily as needed for dizziness.      metoprolol succinate (TOPROL-XL) 50 MG 24 hr tablet Take 50 mg by mouth daily. Take with or immediately following a meal.     Multiple Vitamins-Minerals (MULTI ADULT GUMMIES) CHEW Chew by mouth daily.     potassium chloride (KLOR-CON) 10 MEQ tablet Take 1 tablet (10 mEq total) by mouth every other day. 15 tablet 6  rosuvastatin (CRESTOR) 10 MG tablet Take 10 mg by mouth daily.     Selexipag (UPTRAVI) 1600 MCG TABS Titrate up to 2467mg by mouth 2 times daily. 270 tablet 3   SELEXIPAG PO Take by mouth.     Semaglutide,0.25 or 0.5MG/DOS, (OZEMPIC, 0.25 OR 0.5 MG/DOSE,) 2 MG/1.5ML SOPN Ozempic (0.25 or 0.5 MG/DOSE) 2 MG/1.5ML Subcutaneous Solution Pen-injector QTY: 1.5  Days: 28 Refills: 0  Written: 08/12/20 Patient Instructions:     tadalafil, PAH, (ADCIRCA) 20 MG tablet Take 2 tablets (40 mg total) by mouth daily. 60 tablet 11   nitroGLYCERIN (NITROSTAT) 0.4 MG SL tablet Nitroglycerin 0.4 MG Sublingual Tablet Sublingual QTY: 25 tablet Days: 30 Refills: 0  Written: 11/15/19 Patient Instructions: Take under tongue for chest pain. may take three doses 5 minutes apart (Patient not taking: Reported on 03/11/2021)     tadalafil (CIALIS) 20 MG tablet Tadalafil (PAH) 20 MG Oral Tablet QTY: 0 tablet Days: 0 Refills: 0  Written: 04/13/20 Patient Instructions:     zaleplon (SONATA) 10 MG capsule Take by mouth.     No current facility-administered medications for this visit.     PHYSICAL EXAMINATION: ECOG PERFORMANCE STATUS: 1 - Symptomatic but completely ambulatory Vitals:   03/11/21 1452  BP: (!) 141/77  Pulse: (!) 54  Resp: 16  Temp: (!) 97.5 F (36.4 C)  SpO2: 100%   Filed Weights   03/11/21 1452  Weight: 168 lb (76.2 kg)    Physical Exam Constitutional:      General: She is not in  acute distress. HENT:     Head: Normocephalic and atraumatic.  Eyes:     General: No scleral icterus. Cardiovascular:     Rate and Rhythm: Normal rate and regular rhythm.     Heart sounds: Normal heart sounds.  Pulmonary:     Effort: Pulmonary effort is normal. No respiratory distress.     Breath sounds: No wheezing.  Abdominal:     General: Bowel sounds are normal. There is no distension.     Palpations: Abdomen is soft.  Musculoskeletal:        General: No deformity. Normal range of motion.     Cervical back: Normal range of motion and neck supple.  Skin:    General: Skin is warm and dry.     Findings: No erythema or rash.  Neurological:     Mental Status: She is alert and oriented to person, place, and time. Mental status is at baseline.     Cranial Nerves: No cranial nerve deficit.     Coordination: Coordination normal.  Psychiatric:        Mood and Affect: Mood normal.    LABORATORY DATA:  I have reviewed the data as listed Lab Results  Component Value Date   WBC 3.7 (L) 02/26/2021   HGB 9.7 (L) 02/26/2021   HCT 31.4 (L) 02/26/2021   MCV 86.3 02/26/2021   PLT 148 (L) 02/26/2021   Recent Labs    09/11/20 1228 12/11/20 1235 02/26/21 1313  NA 139 136 141  K 3.7 3.6 3.8  CL 107 104 105  CO2 25 21* 26  GLUCOSE 125* 116* 79  BUN _0 CREATININE 1.51* 1.43* 1.43*  CALCIUM 9.1 9.2 9.4  GFRNONAA 36* 38* 38*  PROT  --   --  7.7  ALBUMIN  --   --  3.9  AST  --   --  24  ALT  --   --  18  ALKPHOS  --   --  64  BILITOT  --   --  0.5    Iron/TIBC/Ferritin/ %Sat    Component Value Date/Time   IRON 28 02/26/2021 1313   IRON 38 07/14/2016 1140   TIBC 287 02/26/2021 1313   TIBC 302 07/14/2016 1140   FERRITIN 28 02/26/2021 1313   FERRITIN 32 07/14/2016 1140   IRONPCTSAT 10 (L) 02/26/2021 1313   IRONPCTSAT 13 (L) 07/14/2016 1140      RADIOGRAPHIC STUDIES: I have personally reviewed the radiological images as listed and agreed with the findings in the  report. No results found.    ASSESSMENT & PLAN:  1. Anemia due to stage 3b chronic kidney disease ([place])   2. MGUS (monoclonal gammopathy of unknown significance)    Labs are reviewed and discussed with patient. Anemia is most likely due to CKD stage 3b.  Recommend patient to further increase iron store. Increase ferritin to be above 100-200.  Plan IV iron with Venofer 220m weekly x 3 doses. Allergy reactions/infusion reaction including anaphylactic reaction discussed with patient. Other side effects include but not limited to high blood pressure, skin rash, weight gain, leg swelling, etc. Patient voices understanding and willing to proceed.   IgM kappa MGUS- M protein 0.7, light chain ratio 3.46 I discussed with patient about the diagnosis of IgM MGUS which is an asymptomatic condition which has a small risk of progression to smoldering [place] macroglobulinemia and to symptomatic [place] macroglobulinemia, and less often to lymphoma or AL amyloidosis. Infrequently, IgM MGUS can progress to IgM multiple myeloma. I recommend observation. Check multiple myeloma panel, light chain ratio every 6 months  Follow up in 3 months for evaluation of treatment response.  Orders Placed This Encounter  Procedures   CBC with Differential/Platelet    Standing Status:   Future    Standing Expiration Date:   03/11/2022   Ferritin    Standing Status:   Future    Standing Expiration Date:   03/11/2022   Iron and TIBC    Standing Status:   Future    Standing Expiration Date:   03/11/2022    All questions were answered. The patient knows to call the clinic with any problems questions or concerns.  cc SMechele Claude FNP   ZEarlie Server MD, PhD [facility]ology [facility] 03/11/2021

## 2021-03-15 ENCOUNTER — Other Ambulatory Visit: Payer: Self-pay | Admitting: Oncology

## 2021-03-15 DIAGNOSIS — D631 Anemia in chronic kidney disease: Secondary | ICD-10-CM | POA: Insufficient documentation

## 2021-03-15 DIAGNOSIS — N1832 Chronic kidney disease, stage 3b: Secondary | ICD-10-CM | POA: Diagnosis present

## 2021-03-16 ENCOUNTER — Other Ambulatory Visit: Payer: Self-pay

## 2021-03-16 ENCOUNTER — Ambulatory Visit: Payer: Medicare PPO | Admitting: Podiatry

## 2021-03-16 DIAGNOSIS — B353 Tinea pedis: Secondary | ICD-10-CM | POA: Diagnosis not present

## 2021-03-16 MED ORDER — CLOTRIMAZOLE-BETAMETHASONE 1-0.05 % EX CREA: 1.0000 "application " | TOPICAL_CREAM | Freq: Two times a day (BID) | CUTANEOUS | 1 refills | Status: DC

## 2021-03-16 MED ORDER — TERBINAFINE HCL 250 MG PO TABS: 250.0000 mg | ORAL_TABLET | Freq: Every day | ORAL | 0 refills | Status: DC

## 2021-03-16 NOTE — Progress Notes (Signed)
   HPI: 75 y.o. female presenting today for new complaint regarding itching with burning sensation to the interdigital areas of the bilateral feet this been going on for few weeks now.  Patient is concerned for possible athlete's foot.  She does not know where she may have gotten it.  She presents for further treatment evaluation.  Currently she has not done anything for treatment  Past Medical History:  Diagnosis Date   Allergy    Anemia    Anxiety    COPD (chronic obstructive pulmonary disease) (HCC)    Depression    Diabetes (HCC)    GERD (gastroesophageal reflux disease)    Hyperlipidemia    Hypertension    Insomnia    Migraines    Osteoporosis    Rocky Mountain spotted fever    Transient cerebral ischemia    Vertigo    every 2-3 months     Physical Exam: General: The patient is alert and oriented x3 in no acute distress.  Dermatology: Skin is warm, dry and supple bilateral lower extremities. Negative for open lesions or macerations.  Diffuse peeling of the skin especially to the interdigital areas with pruritus noted to the bilateral forefoot and along the plantar arch  Vascular: Palpable pedal pulses bilaterally. No edema or erythema noted. Capillary refill within normal limits.  Neurological: Epicritic and protective threshold grossly intact bilaterally.   Musculoskeletal Exam: No pedal deformities noted  Assessment: 1.  Tinea pedis bilateral   Plan of Care:  1. Patient evaluated.  2.  Today we discussed the etiology of athletes foot/foot fungus and different treatment modalities. 3.  Prescription for Lamisil 2 and 50 mg #28 4.  Lotrisone cream apply 2 times daily 5.  Return to clinic as needed      Edrick Kins, DPM [facility]  Dr. Edrick Kins, DPM    [address], [place] [address]                Office [phone]  Fax [phone]

## 2021-03-17 ENCOUNTER — Inpatient Hospital Stay: Payer: Medicare PPO

## 2021-03-17 ENCOUNTER — Other Ambulatory Visit (HOSPITAL_COMMUNITY): Payer: Self-pay

## 2021-03-17 VITALS — BP 133/58 | HR 59 | Temp 95.9°F

## 2021-03-17 DIAGNOSIS — D631 Anemia in chronic kidney disease: Secondary | ICD-10-CM | POA: Diagnosis not present

## 2021-03-17 DIAGNOSIS — N189 Chronic kidney disease, unspecified: Secondary | ICD-10-CM | POA: Diagnosis not present

## 2021-03-17 DIAGNOSIS — N1832 Chronic kidney disease, stage 3b: Secondary | ICD-10-CM

## 2021-03-17 DIAGNOSIS — Z79899 Other long term (current) drug therapy: Secondary | ICD-10-CM | POA: Diagnosis not present

## 2021-03-17 MED ORDER — FUROSEMIDE 20 MG PO TABS: 20.0000 mg | ORAL_TABLET | ORAL | 2 refills | Status: DC

## 2021-03-17 MED ORDER — IRON SUCROSE 20 MG/ML IV SOLN
200.0000 mg | Freq: Once | INTRAVENOUS | Status: AC
  Administered 2021-03-17: 200 mg via INTRAVENOUS
  Filled 2021-03-17: qty 10

## 2021-03-17 MED ORDER — POTASSIUM CHLORIDE ER 10 MEQ PO TBCR: 10.0000 meq | EXTENDED_RELEASE_TABLET | ORAL | 2 refills | Status: DC

## 2021-03-17 MED ORDER — SODIUM CHLORIDE 0.9 % IV SOLN: 200.0000 mg | Freq: Once | INTRAVENOUS | Status: DC

## 2021-03-17 MED ORDER — SODIUM CHLORIDE 0.9 % IV SOLN
Freq: Once | INTRAVENOUS | Status: AC
Start: 2021-03-17 — End: 2021-03-17
  Filled 2021-03-17: qty 250

## 2021-03-17 NOTE — Patient Instructions (Addendum)
[facility]  Discharge Instructions: Thank you for choosing [facility] to provide your oncology and hematology care.  If you have a lab appointment with the [facility], please go directly to the [facility] and check in at the registration area.  Wear comfortable clothing and clothing appropriate for easy access to any Portacath or PICC line.   We strive to give you quality time with your provider. You may need to reschedule your appointment if you arrive late (15 or more minutes).  Arriving late affects you and other patients whose appointments are after yours.  Also, if you miss three or more appointments without notifying the office, you may be dismissed from the clinic at the provider's discretion.      For prescription refill requests, have your pharmacy contact our office and allow 72 hours for refills to be completed.    Today you received the following : Venofer   To help prevent nausea and vomiting after your treatment, we encourage you to take your nausea medication as directed.  BELOW ARE SYMPTOMS THAT SHOULD BE REPORTED IMMEDIATELY: *FEVER GREATER THAN 100.4 F (38 C) OR HIGHER *CHILLS OR SWEATING *NAUSEA AND VOMITING THAT IS NOT CONTROLLED WITH YOUR NAUSEA MEDICATION *UNUSUAL SHORTNESS OF BREATH *UNUSUAL BRUISING OR BLEEDING *URINARY PROBLEMS (pain or burning when urinating, or frequent urination) *BOWEL PROBLEMS (unusual diarrhea, constipation, pain near the anus) TENDERNESS IN MOUTH AND THROAT WITH OR WITHOUT PRESENCE OF ULCERS (sore throat, sores in mouth, or a toothache) UNUSUAL RASH, SWELLING OR PAIN  UNUSUAL VAGINAL DISCHARGE OR ITCHING   Items with * indicate a potential emergency and should be followed up as soon as possible or go to the Emergency Department if any problems should occur.  Please show the CHEMOTHERAPY ALERT CARD or IMMUNOTHERAPY ALERT CARD at check-in to the Emergency Department and triage  nurse.  Should you have questions after your visit or need to cancel or reschedule your appointment, please contact [facility]  [phone] and follow the prompts.  Office hours are 8:00 a.m. to 4:30 p.m. Monday - Friday. Please note that voicemails left after 4:00 p.m. may not be returned until the following business day.  We are closed weekends and major holidays. You have access to a nurse at all times for urgent questions. Please call the main number to the clinic [phone] and follow the prompts.  For any non-urgent questions, you may also contact your provider using MyChart. We now offer e-Visits for anyone 8 and older to request care online for non-urgent symptoms. For details visit [email or web address].   Also download the MyChart app! Go to the app store, search "MyChart", open the app, select [facility], and log in with your MyChart username and password.  Due to Covid, a mask is required upon entering the hospital/clinic. If you do not have a mask, one will be given to you upon arrival. For doctor visits, patients may have 1 support person aged 85 or older with them. For treatment visits, patients cannot have anyone with them due to current Covid guidelines and our immunocompromised population. Iron Sucrose Injection What is this medication? IRON SUCROSE (EYE ern SOO krose) treats low levels of iron (iron deficiency anemia) in people with kidney disease. Iron is a mineral that plays an important role in making red blood cells, which carry oxygen from your lungs to the rest of your body. This medicine may be used for other purposes; ask your health  care provider or pharmacist if you have questions.    COMMON BRAND NAME(S): Venofer What should I tell my care team before I take this medication? They need to know if you have any of these conditions: Anemia not caused by low iron levels Heart disease High levels of iron in the blood Kidney  disease Liver disease An unusual or allergic reaction to iron, other medications, foods, dyes, or preservatives Pregnant or trying to get pregnant Breast-feeding How should I use this medication? This medication is for infusion into a vein. It is given in a hospital or clinic setting. Talk to your care team about the use of this medication in children. While this medication may be prescribed for children as young as 2 years for selected conditions, precautions do apply. Overdosage: If you think you have taken too much of this medicine contact a poison control center or emergency room at once. NOTE: This medicine is only for you. Do not share this medicine with others. What if I miss a dose? It is important not to miss your dose. Call your care team if you are unable to keep an appointment. What may interact with this medication? Do not take this medication with any of the following: Deferoxamine Dimercaprol Other iron products This medication may also interact with the following: Chloramphenicol Deferasirox This list may not describe all possible interactions. Give your health care provider a list of all the medicines, herbs, non-prescription drugs, or dietary supplements you use. Also tell them if you smoke, drink alcohol, or use illegal drugs. Some items may interact with your medicine. What should I watch for while using this medication? Visit your care team regularly. Tell your care team if your symptoms do not start to get better or if they get worse. You may need blood work done while you are taking this medication. You may need to follow a special diet. Talk to your care team. Foods that contain iron include: whole grains/cereals, dried fruits, beans, or peas, leafy green vegetables, and organ meats (liver, kidney). What side effects may I notice from receiving this medication? Side effects that you should report to your care team as soon as possible: Allergic reactions-skin rash,  itching, hives, swelling of the face, lips, tongue, or throat Low blood pressure-dizziness, feeling faint or lightheaded, blurry vision Shortness of breath Side effects that usually do not require medical attention (report to your care team if they continue or are bothersome): Flushing Headache Joint pain Muscle pain Nausea Pain, redness, or irritation at injection site This list may not describe all possible side effects. Call your doctor for medical advice about side effects. You may report side effects to FDA at 1-800-FDA-1088. Where should I keep my medication? This medication is given in a hospital or clinic and will not be stored at home. NOTE: This sheet is a summary. It may not cover all possible information. If you have questions about this medicine, talk to your doctor, pharmacist, or health care provider.  2022 Elsevier/Gold Standard (2020-08-18 12:52:06)

## 2021-03-24 ENCOUNTER — Inpatient Hospital Stay: Payer: Medicare PPO

## 2021-03-24 ENCOUNTER — Other Ambulatory Visit: Payer: Self-pay

## 2021-03-24 VITALS — BP 124/53 | HR 58 | Temp 96.4°F | Resp 20

## 2021-03-24 DIAGNOSIS — D631 Anemia in chronic kidney disease: Secondary | ICD-10-CM | POA: Diagnosis not present

## 2021-03-24 DIAGNOSIS — Z79899 Other long term (current) drug therapy: Secondary | ICD-10-CM | POA: Diagnosis not present

## 2021-03-24 DIAGNOSIS — N1832 Chronic kidney disease, stage 3b: Secondary | ICD-10-CM

## 2021-03-24 DIAGNOSIS — N189 Chronic kidney disease, unspecified: Secondary | ICD-10-CM | POA: Diagnosis not present

## 2021-03-24 MED ORDER — SODIUM CHLORIDE 0.9 % IV SOLN
Freq: Once | INTRAVENOUS | Status: AC
  Filled 2021-03-24: qty 250

## 2021-03-24 MED ORDER — IRON SUCROSE 20 MG/ML IV SOLN
200.0000 mg | Freq: Once | INTRAVENOUS | Status: AC
Start: 2021-03-24 — End: 2021-03-24
  Administered 2021-03-24: 200 mg via INTRAVENOUS
  Filled 2021-03-24: qty 10

## 2021-03-24 MED ORDER — SODIUM CHLORIDE 0.9 % IV SOLN: 200.0000 mg | Freq: Once | INTRAVENOUS | Status: DC

## 2021-03-24 NOTE — Patient Instructions (Signed)

## 2021-03-31 ENCOUNTER — Inpatient Hospital Stay: Payer: Medicare PPO

## 2021-03-31 ENCOUNTER — Telehealth (HOSPITAL_COMMUNITY): Payer: Self-pay | Admitting: Pharmacist

## 2021-03-31 ENCOUNTER — Other Ambulatory Visit: Payer: Self-pay

## 2021-03-31 VITALS — BP 134/54 | HR 60

## 2021-03-31 DIAGNOSIS — Z79899 Other long term (current) drug therapy: Secondary | ICD-10-CM | POA: Diagnosis not present

## 2021-03-31 DIAGNOSIS — D631 Anemia in chronic kidney disease: Secondary | ICD-10-CM | POA: Diagnosis not present

## 2021-03-31 DIAGNOSIS — N1832 Chronic kidney disease, stage 3b: Secondary | ICD-10-CM

## 2021-03-31 DIAGNOSIS — N189 Chronic kidney disease, unspecified: Secondary | ICD-10-CM | POA: Diagnosis not present

## 2021-03-31 MED ORDER — FUROSEMIDE 20 MG PO TABS: 20.0000 mg | ORAL_TABLET | ORAL | 5 refills | Status: DC

## 2021-03-31 MED ORDER — SODIUM CHLORIDE 0.9 % IV SOLN: 200.0000 mg | Freq: Once | INTRAVENOUS | Status: DC

## 2021-03-31 MED ORDER — SODIUM CHLORIDE 0.9 % IV SOLN
Freq: Once | INTRAVENOUS | Status: AC
  Filled 2021-03-31: qty 250

## 2021-03-31 MED ORDER — IRON SUCROSE 20 MG/ML IV SOLN
200.0000 mg | Freq: Once | INTRAVENOUS | Status: AC
  Administered 2021-03-31: 200 mg via INTRAVENOUS
  Filled 2021-03-31: qty 10

## 2021-03-31 NOTE — Patient Instructions (Signed)
[facility]  Discharge Instructions: Thank you for choosing [facility] to provide your oncology and hematology care.  If you have a lab appointment with the [facility], please go directly to the [facility] and check in at the registration area.  Wear comfortable clothing and clothing appropriate for easy access to any Portacath or PICC line.   We strive to give you quality time with your provider. You may need to reschedule your appointment if you arrive late (15 or more minutes).  Arriving late affects you and other patients whose appointments are after yours.  Also, if you miss three or more appointments without notifying the office, you may be dismissed from the clinic at the provider's discretion.      For prescription refill requests, have your pharmacy contact our office and allow 72 hours for refills to be completed.    Today you received the following chemotherapy and/or immunotherapy agents:  Venofer   To help prevent nausea and vomiting after your treatment, we encourage you to take your nausea medication as directed.  BELOW ARE SYMPTOMS THAT SHOULD BE REPORTED IMMEDIATELY: *FEVER GREATER THAN 100.4 F (38 C) OR HIGHER *CHILLS OR SWEATING *NAUSEA AND VOMITING THAT IS NOT CONTROLLED WITH YOUR NAUSEA MEDICATION *UNUSUAL SHORTNESS OF BREATH *UNUSUAL BRUISING OR BLEEDING *URINARY PROBLEMS (pain or burning when urinating, or frequent urination) *BOWEL PROBLEMS (unusual diarrhea, constipation, pain near the anus) TENDERNESS IN MOUTH AND THROAT WITH OR WITHOUT PRESENCE OF ULCERS (sore throat, sores in mouth, or a toothache) UNUSUAL RASH, SWELLING OR PAIN  UNUSUAL VAGINAL DISCHARGE OR ITCHING   Items with * indicate a potential emergency and should be followed up as soon as possible or go to the Emergency Department if any problems should occur.  Please show the CHEMOTHERAPY ALERT CARD or IMMUNOTHERAPY ALERT CARD at check-in to  the Emergency Department and triage nurse.  Should you have questions after your visit or need to cancel or reschedule your appointment, please contact [facility]  [phone] and follow the prompts.  Office hours are 8:00 a.m. to 4:30 p.m. Monday - Friday. Please note that voicemails left after 4:00 p.m. may not be returned until the following business day.  We are closed weekends and major holidays. You have access to a nurse at all times for urgent questions. Please call the main number to the clinic [phone] and follow the prompts.  For any non-urgent questions, you may also contact your provider using MyChart. We now offer e-Visits for anyone 78 and older to request care online for non-urgent symptoms. For details visit [email or web address].   Also download the MyChart app! Go to the app store, search "MyChart", open the app, select [facility], and log in with your MyChart username and password.  Due to Covid, a mask is required upon entering the hospital/clinic. If you do not have a mask, one will be given to you upon arrival. For doctor visits, patients may have 1 support person aged 56 or older with them. For treatment visits, patients cannot have anyone with them due to current Covid guidelines and our immunocompromised population.

## 2021-03-31 NOTE — Telephone Encounter (Signed)
Refill of furosemide sent to [facility].   [name], PharmD, BCPS, BCCP, CPP Heart Failure Clinic Pharmacist [phone]

## 2021-04-02 DIAGNOSIS — I10 Essential (primary) hypertension: Secondary | ICD-10-CM | POA: Diagnosis not present

## 2021-04-02 DIAGNOSIS — G4733 Obstructive sleep apnea (adult) (pediatric): Secondary | ICD-10-CM | POA: Diagnosis not present

## 2021-04-05 ENCOUNTER — Other Ambulatory Visit: Payer: Self-pay | Admitting: Podiatry

## 2021-04-06 ENCOUNTER — Telehealth (HOSPITAL_COMMUNITY): Payer: Self-pay | Admitting: Pharmacist

## 2021-04-06 MED ORDER — DAPAGLIFLOZIN PROPANEDIOL 10 MG PO TABS: 10.0000 mg | ORAL_TABLET | Freq: Every day | ORAL | 3 refills | Status: DC

## 2021-04-06 NOTE — Telephone Encounter (Signed)
Farxiga refill sent to [facility] ([facility]).    [name], PharmD, BCPS, BCCP, CPP Heart Failure Clinic Pharmacist [phone]

## 2021-04-14 ENCOUNTER — Encounter (HOSPITAL_COMMUNITY): Payer: Self-pay | Admitting: Cardiology

## 2021-04-14 ENCOUNTER — Telehealth (HOSPITAL_COMMUNITY): Payer: Self-pay | Admitting: Pharmacist

## 2021-04-14 ENCOUNTER — Ambulatory Visit (HOSPITAL_COMMUNITY)
Admission: RE | Admit: 2021-04-14 | Discharge: 2021-04-14 | Disposition: A | Discharge status: Home / Self Care | Payer: Medicare PPO | Source: Ambulatory Visit | Attending: Cardiology | Admitting: Cardiology

## 2021-04-14 VITALS — BP 132/66 | HR 54 | Wt 173.8 lb

## 2021-04-14 DIAGNOSIS — Z7982 Long term (current) use of aspirin: Secondary | ICD-10-CM | POA: Diagnosis not present

## 2021-04-14 DIAGNOSIS — N183 Chronic kidney disease, stage 3 unspecified: Secondary | ICD-10-CM | POA: Diagnosis not present

## 2021-04-14 DIAGNOSIS — R7982 Elevated C-reactive protein (CRP): Secondary | ICD-10-CM | POA: Insufficient documentation

## 2021-04-14 DIAGNOSIS — I5032 Chronic diastolic (congestive) heart failure: Secondary | ICD-10-CM | POA: Insufficient documentation

## 2021-04-14 DIAGNOSIS — J984 Other disorders of lung: Secondary | ICD-10-CM | POA: Diagnosis not present

## 2021-04-14 DIAGNOSIS — Z794 Long term (current) use of insulin: Secondary | ICD-10-CM | POA: Insufficient documentation

## 2021-04-14 DIAGNOSIS — R519 Headache, unspecified: Secondary | ICD-10-CM | POA: Diagnosis not present

## 2021-04-14 DIAGNOSIS — Z8249 Family history of ischemic heart disease and other diseases of the circulatory system: Secondary | ICD-10-CM | POA: Diagnosis not present

## 2021-04-14 DIAGNOSIS — I272 Pulmonary hypertension, unspecified: Secondary | ICD-10-CM

## 2021-04-14 DIAGNOSIS — I129 Hypertensive chronic kidney disease with stage 1 through stage 4 chronic kidney disease, or unspecified chronic kidney disease: Secondary | ICD-10-CM | POA: Diagnosis not present

## 2021-04-14 DIAGNOSIS — R700 Elevated erythrocyte sedimentation rate: Secondary | ICD-10-CM | POA: Insufficient documentation

## 2021-04-14 DIAGNOSIS — G4733 Obstructive sleep apnea (adult) (pediatric): Secondary | ICD-10-CM | POA: Insufficient documentation

## 2021-04-14 DIAGNOSIS — Z7984 Long term (current) use of oral hypoglycemic drugs: Secondary | ICD-10-CM | POA: Insufficient documentation

## 2021-04-14 LAB — BASIC METABOLIC PANEL
Anion gap: 11 (ref 5–15)
BUN: 24 mg/dL — ABNORMAL HIGH (ref 8–23)
CO2: 22 mmol/L (ref 22–32)
Calcium: 9.1 mg/dL (ref 8.9–10.3)
Chloride: 105 mmol/L (ref 98–111)
Creatinine, Ser: 1.25 mg/dL — ABNORMAL HIGH (ref 0.44–1.00)
GFR, Estimated: 45 mL/min — ABNORMAL LOW (ref >60)
Glucose, Bld: 116 mg/dL — ABNORMAL HIGH (ref 70–99)
Potassium: 3.8 mmol/L (ref 3.5–5.1)
Sodium: 138 mmol/L (ref 135–145)

## 2021-04-14 LAB — BRAIN NATRIURETIC PEPTIDE: B Natriuretic Peptide: 289 pg/mL — ABNORMAL HIGH (ref 0.0–100.0)

## 2021-04-14 MED ORDER — UPTRAVI 1200 MCG PO TABS: 1.0000 | ORAL_TABLET | Freq: Two times a day (BID) | ORAL | 11 refills | Status: DC

## 2021-04-14 MED ORDER — DAPAGLIFLOZIN PROPANEDIOL 10 MG PO TABS: 10.0000 mg | ORAL_TABLET | Freq: Every day | ORAL | 3 refills | Status: DC

## 2021-04-14 MED ORDER — AMLODIPINE BESYLATE 10 MG PO TABS: 10.0000 mg | ORAL_TABLET | Freq: Every day | ORAL | 3 refills | Status: DC

## 2021-04-14 NOTE — Telephone Encounter (Signed)
Provided patient with Farxiga samples.  Medication: Farxiga 10 mg Quantity: 2 boxes  Lot: [id number] Expiration date: 05/06/23   [name], PharmD, BCPS, CPP Heart Failure Clinic Pharmacist [phone]

## 2021-04-14 NOTE — Progress Notes (Signed)
6 Min Walk Test Completed  Pt ambulated 1250 feet and 381 meters. O2 Sat ranged 100, and no L of oxygen was used. HR ranged 69-84

## 2021-04-14 NOTE — Patient Instructions (Addendum)
6 minute walk test done today.   Labs done today. We will contact you only if your labs are abnormal.  RESTART Farxiga 33m (1 tablet) by mouth daily.   DECREASE Uptravi to 12042m by mouth 2 times daily.   No other medication changes were made. Please continue all current medications as prescribed.  Your physician recommends that you schedule a follow-up appointment in: 3 months  If you have any questions or concerns before your next appointment please send [place] message through [place] call our office at [phone]   TO LEAVE A MESSAGE FOR THE NURSE SELECT OPTION 2, PLEASE LEAVE A MESSAGE INCLUDING: YOUR NAME DATE OF BIRTH CALL BACK NUMBER REASON FOR CALL**this is important as we prioritize the call backs  YOU WILL RECEIVE A CALL BACK THE SAME DAY AS LONG AS YOU CALL BEFORE 4:00 PM   Do the following things EVERYDAY: Weigh yourself in the morning before breakfast. Write it down and keep it in a log. Take your medicines as prescribed Eat low salt foods--Limit salt (sodium) to 2000 mg per day.  Stay as active as you can everyday Limit all fluids for the day to less than 2 liters   At the [facility] and your health needs are our priority. As part of our continuing mission to provide you with exceptional heart care, we have created designated Provider Care Teams. These Care Teams include your primary Cardiologist (physician) and Advanced Practice Providers (APPs- Physician Assistants and Nurse Practitioners) who all work together to provide you with the care you need, when you need it.   You may see any of the following providers on your designated Care Team at your next follow up: Dr DaGlori Bickersr DaHaynes KernsNP BrLyda JesterPAUtahaAudry RilesPharmD   Please be sure to bring in all your medications bottles to every appointment.

## 2021-04-15 NOTE — Progress Notes (Signed)
PCP: [name], FNP HF Cardiology: Dr. [name]  75 y.o. with history of HTN, type 2 diabetes, OSA on CPAP, and pulmonary hypertension/RV failure was referred by Dr. Fletcher Anon for evaluation of pulmonary hypertension. It appears that [place] was diagnosed as early as 2018 by echo.  Echo in 3354 showed PA systolic pressure 85 mmHg.  She says that she was told that the pulmonary hypertension was due to OSA.  She has been compliant with CPAP. She reports exertional dyspnea for about 4 years now.   In 8/21, she was admitted to [facility] with CHF.  Echo showed EF 55-60%, mild LVH, D-shaped septum, severe RV dilation with moderate RV systolic dysfunction, PASP 83, moderate TR, moderate PI, dilated IVC.  RHC/LHC showed no coronary disease, severe pulmonary hypertension with PVR 7.38 and preserved cardiac output.  High resolution CT chest in 9/21 showed no evidence for fibrotic ILD.    She saw rheumatology and was given no definite rheumatological diagnosis.    Echo in 7/22 showed EF 65-70% with moderate LVH, RV moderately dilated with moderately decreased RV systolic function, mild RVH, D-shaped septum, PASP 85 mmHg, moderate PR, normal IVC.   She returns for followup of pulmonary hypertension.  She is only taking Uptravi once a day rather than twice a day as it gives her a headache. Breathing is generally better on PH medications. No dyspnea walking on flat ground.  No lightheadedness/syncope.  No chest pain.   She is using CPAP.    Labs (8/21): K 3.3, creatinine 1.39, ANA negative, anti-Sm negative, anti-SCL-70 negative, ANCA negative, RF elevated 52 but CCP negative, ESR 67, CRP 2.2 Labs (9/21): K 4.5, creatinine 1.8 => 1.45, anti-centromere Ab negative, BNP 309 Labs (11/21): K 4.1, creatinine 1.47, BNP 590 Labs (1/22): LDL 55, K 3.8, creatinine 1.49 Labs (5/22): LDL 74, K 4, creatinine 1.5 Labs (9/22): K 3.8, creatinine 1.43  6 minute walk (8/21): 154 m 6 minute walk (11/21): 183 m 6 minute walk (3/22):  351 m 6 minute walk (11/22): 381 m  ECG (personally reviewed): NSR, 1st degree AVB, iRBBB, nonspecific T wave flattening  PMH: 1. HTN 2. Type 2 diabetes 3. OSA: Uses CPAP.  4. GERD 5. Hypothyroidism 6. Pulmonary hypertension/RV failure: Pulmonary hypertension diagnosed 2018 by echo.  - PFTs (6/18): moderate restriction - Echo 2020 with PASP 85 mmHg.  - Echo (8/21): EF 55-60%, mild LVH, D-shaped septum, severe RV dilation with moderate RV systolic dysfunction, PASP 83, moderate TR, moderate PI, dilated IVC.  - LHC/RHC (8/21): Normal coronaries; mean RA 3, PA 82/21 mean 44, mean PCWP 14, CI 2.2, PVR 7.38 WU.  - V/Q scan (8/21): No evidence for chronic PE.  - Serologic workup: ANA negative, anti-Sm negative, anti-SCL-70 negative, ANCA negative, RF elevated 52 but CCP negative, ESR 67, CRP 2.2, anti-centromere negative - High resolution CT chest: No fibrotic ILD.  - Echo (7/22): EF 65-70% with moderate LVH, RV moderately dilated with moderately decreased RV systolic function, mild RVH, D-shaped septum, PASP 85 mmHg, moderate PR, normal IVC.  7. CKD stage 3.   Social History   Socioeconomic History   Marital status: Married    Spouse name: Jimmy   Number of children: 3   Years of education: 14   Highest education level: Not on file  Occupational History    Comment: work part time, day care  Tobacco Use   Smoking status: Never   Smokeless tobacco: Never  Vaping Use   Vaping Use: Never used  Substance and Sexual Activity   Alcohol use: No    Alcohol/week: 0.0 standard drinks   Drug use: No   Sexual activity: Never  Other Topics Concern   Not on file  Social History Narrative   Lives with husband   Caffeine use- coffee 2 cups daily, Coke once a week, green tea daily   Social Determinants of Health   Financial Resource Strain: Not on file  Food Insecurity: Not on file  Transportation Needs: Not on file  Physical Activity: Not on file  Stress: Not on file  Social  Connections: Not on file  Intimate Partner Violence: Not on file   Family History  Problem Relation Age of Onset   Diabetes Mother    Hypertension Mother    Stroke Mother    Heart disease Mother        MI   Cancer Father        pancreatic   Stroke Father    Hypertension Sister    Diabetes Brother    Diabetes Maternal Grandfather    Diabetes Sister    Diabetes Sister    Diabetes Brother    Diabetes Brother    ROS: All systems reviewed and negative except as per HPI.   Current Outpatient Medications  Medication Sig Dispense Refill   ambrisentan (LETAIRIS) 10 MG tablet Take 1 tablet (10 mg total) by mouth daily. 30 tablet 3   aspirin 81 MG tablet Take 81 mg by mouth daily.     clonazePAM (KLONOPIN) 1 MG tablet Take 1 mg by mouth daily.     clotrimazole-betamethasone (LOTRISONE) cream APPLY TO FEET TOPICALLY TWICE A DAY 45 g 1   escitalopram (LEXAPRO) 10 MG tablet Take 10 mg by mouth daily.     esomeprazole (NEXIUM) 40 MG capsule Take 40 mg by mouth daily at 12 noon.     Fe Fum-FA-B Cmp-C-Zn-Mg-Mn-Cu (HEMOCYTE PLUS) 106-1 MG CAPS Take 1 capsule by mouth daily.  6   furosemide (LASIX) 20 MG tablet Take 1 tablet (20 mg total) by mouth every other day. 15 tablet 5   hydrALAZINE (APRESOLINE) 50 MG tablet Take 1 tablet (50 mg total) by mouth every 8 (eight) hours. 90 tablet 0   insulin aspart (NOVOLOG) 100 UNIT/ML FlexPen Inject 4 Units into the skin 3 (three) times daily with meals. (Patient taking differently: Inject 4 Units into the skin 3 (three) times daily with meals. Only for BS greater than 200) 15 mL 0   insulin degludec (TRESIBA) 200 UNIT/ML FlexTouch Pen Inject 26 Units into the skin at bedtime.     Insulin Pen Needle (B-D UF III MINI PEN NEEDLES) 31G X 5 MM MISC 4 (four) times daily     levothyroxine (SYNTHROID) 75 MCG tablet Take 75 mcg by mouth daily before breakfast.     loratadine (CLARITIN) 10 MG tablet Take 10 mg by mouth daily.      meclizine (ANTIVERT) 25 MG tablet  Take 25 mg by mouth 3 (three) times daily as needed for dizziness.      metoprolol succinate (TOPROL-XL) 50 MG 24 hr tablet Take 50 mg by mouth daily. Take with or immediately following a meal.     Multiple Vitamins-Minerals (MULTI ADULT GUMMIES) CHEW Chew by mouth daily.     nitroGLYCERIN (NITROSTAT) 0.4 MG SL tablet      potassium chloride (KLOR-CON) 10 MEQ tablet Take 1 tablet (10 mEq total) by mouth every other day. 15 tablet 2   rosuvastatin (CRESTOR) 10 MG  tablet Take 10 mg by mouth daily.     tadalafil, PAH, (ADCIRCA) 20 MG tablet Take 2 tablets (40 mg total) by mouth daily. 60 tablet 11   terbinafine (LAMISIL) 250 MG tablet Take 1 tablet (250 mg total) by mouth daily. 28 tablet 0   amLODipine (NORVASC) 10 MG tablet Take 1 tablet (10 mg total) by mouth daily. 90 tablet 3   dapagliflozin propanediol (FARXIGA) 10 MG TABS tablet Take 1 tablet (10 mg total) by mouth daily before breakfast. 90 tablet 3   Selexipag (UPTRAVI) 1200 MCG TABS Take 1 tablet by mouth 2 (two) times daily. 60 tablet 11   No current facility-administered medications for this encounter.   BP 132/66   Pulse (!) 54   Wt 78.8 kg (173 lb 12.8 oz)   LMP  (LMP Unknown)   SpO2 96%   BMI 26.82 kg/m  General: NAD Neck: No JVD, no thyromegaly or thyroid nodule.  Lungs: Clear to auscultation bilaterally with normal respiratory effort. CV: Nondisplaced PMI.  Heart regular S1/S2, no S3/S4, no murmur.  No peripheral edema.  No carotid bruit.  Normal pedal pulses.  Abdomen: Soft, nontender, no hepatosplenomegaly, no distention.  Skin: Intact without lesions or rashes.  Neurologic: Alert and oriented x 3.  Psych: Normal affect. Extremities: No clubbing or cyanosis.  HEENT: Normal. =  Assessment/Plan: 1. HTN: BP controlled.  - Continue amlodipine.  2. OSA: Continue CPAP.    3. Pulmonary HTN/RV failure: Echo in 8/21 with EF 55-60%, mild LVH, D-shaped septum, severe RV dilation with moderate RV systolic dysfunction, PASP 83,  moderate TR, moderate PI, dilated IVC.  Suspect long-standing RV dysfunction based on appearance of echo. [place] in 8/21 with severe pulmonary arterial hypertension, preserved cardiac output. PFTs from 6/18 showed moderate restriction.  V/Q scan from 8/21 showed no evidence for chronic PE. She has treated OSA.  Serologic workup was negative except for elevated CRP and ESR and elevated RF (normal CCP).  High resolution CT chest did not show evidence for ILD. I suspect long-standing group 1 PAH. Repeat echo in 7/22 showed EF 65-70% with moderate LVH, RV moderately dilated with moderately decreased RV systolic function, mild RVH, D-shaped septum, PASP 85 mmHg, moderate PR, normal IVC. Though echo still shows significant RV dysfunction, she symptomatically has felt better on PH meds (NYHA class II).  She is not volume overloaded on exam.  Improved 6 minute walk.  - Continue Lasix 20 mg qod and Farxiga 10 mg daily, check BMET and BNP.  - Continue ambrisentan 10.  - Continue tadalafil 40 mg daily.   - She needs to go back to taking selexipag bid rather than qd.  She tolerates 1600 mcg poorly due to headache.  I will decrease the dose to 1200 mcg and have her take it bid. - Continue CPAP (OSA likely contributes but certainly cannot explain the extent of her [place]).  - Continue exercise.  4. Pulmonary insufficiency: Moderate on 7/22 echo.    Followup in 3 months   Loralie Champagne 04/15/2021

## 2021-04-22 DIAGNOSIS — E559 Vitamin D deficiency, unspecified: Secondary | ICD-10-CM | POA: Diagnosis not present

## 2021-04-22 DIAGNOSIS — F411 Generalized anxiety disorder: Secondary | ICD-10-CM | POA: Diagnosis not present

## 2021-04-22 DIAGNOSIS — E782 Mixed hyperlipidemia: Secondary | ICD-10-CM | POA: Diagnosis not present

## 2021-04-22 DIAGNOSIS — G4733 Obstructive sleep apnea (adult) (pediatric): Secondary | ICD-10-CM | POA: Diagnosis not present

## 2021-04-22 DIAGNOSIS — I10 Essential (primary) hypertension: Secondary | ICD-10-CM | POA: Diagnosis not present

## 2021-04-22 DIAGNOSIS — E1122 Type 2 diabetes mellitus with diabetic chronic kidney disease: Secondary | ICD-10-CM | POA: Diagnosis not present

## 2021-04-22 DIAGNOSIS — E039 Hypothyroidism, unspecified: Secondary | ICD-10-CM | POA: Diagnosis not present

## 2021-04-23 ENCOUNTER — Telehealth (HOSPITAL_COMMUNITY): Payer: Self-pay | Admitting: Pharmacy Technician

## 2021-04-23 NOTE — Telephone Encounter (Signed)
Advanced Heart Failure Patient Advocate Encounter  Received patient assistance application for Uptravi. Called patient to see if she sent her portion or if we need to start the whole application together. Left vm

## 2021-05-03 ENCOUNTER — Telehealth (HOSPITAL_COMMUNITY): Payer: Self-pay | Admitting: *Deleted

## 2021-05-03 NOTE — Telephone Encounter (Signed)
Pts daughter left vm stating pt was having an adverse reaction to a medication. I called daughter Tillie Rung back at [phone] no answer/left vm requesting return call.

## 2021-05-04 ENCOUNTER — Other Ambulatory Visit (HOSPITAL_COMMUNITY): Payer: Self-pay | Admitting: *Deleted

## 2021-05-04 MED ORDER — UPTRAVI TITRATION 200 & 800 MCG PO TBPK: ORAL_TABLET | ORAL | 3 refills | Status: DC

## 2021-05-07 ENCOUNTER — Telehealth (HOSPITAL_COMMUNITY): Payer: Self-pay | Admitting: Pharmacy Technician

## 2021-05-07 ENCOUNTER — Other Ambulatory Visit (HOSPITAL_COMMUNITY): Payer: Self-pay

## 2021-05-07 NOTE — Telephone Encounter (Signed)
Patient Advocate Encounter   Received notification from [facility] that prior authorization for Uptravi 200 & 824mg pack is required.   PA submitted on CoverMyMeds Key  [id number] is pending   Will continue to follow.

## 2021-05-10 NOTE — Telephone Encounter (Signed)
Advanced Heart Failure Patient Advocate Encounter  Prior Authorization for Jamie Murray has been approved.    PA#  [id number] Effective dates: through 06/05/22  Charlann Boxer, CPhT

## 2021-05-14 DIAGNOSIS — E063 Autoimmune thyroiditis: Secondary | ICD-10-CM | POA: Diagnosis not present

## 2021-05-14 DIAGNOSIS — E1165 Type 2 diabetes mellitus with hyperglycemia: Secondary | ICD-10-CM | POA: Diagnosis not present

## 2021-05-14 DIAGNOSIS — E1159 Type 2 diabetes mellitus with other circulatory complications: Secondary | ICD-10-CM | POA: Diagnosis not present

## 2021-05-14 DIAGNOSIS — Z794 Long term (current) use of insulin: Secondary | ICD-10-CM | POA: Diagnosis not present

## 2021-05-14 DIAGNOSIS — I152 Hypertension secondary to endocrine disorders: Secondary | ICD-10-CM | POA: Diagnosis not present

## 2021-05-14 DIAGNOSIS — E782 Mixed hyperlipidemia: Secondary | ICD-10-CM | POA: Diagnosis not present

## 2021-05-14 DIAGNOSIS — E1142 Type 2 diabetes mellitus with diabetic polyneuropathy: Secondary | ICD-10-CM | POA: Diagnosis not present

## 2021-05-18 DIAGNOSIS — H0100A Unspecified blepharitis right eye, upper and lower eyelids: Secondary | ICD-10-CM | POA: Diagnosis not present

## 2021-05-19 ENCOUNTER — Telehealth (HOSPITAL_COMMUNITY): Payer: Self-pay | Admitting: Pharmacist

## 2021-05-19 NOTE — Telephone Encounter (Signed)
Advanced Heart Failure Patient Advocate Encounter  Prior Authorizations for Ambrisentan and tadalafil have been approved.    Effective dates: 06/06/20 through 06/05/22  [name], PharmD, BCPS, BCCP, CPP Heart Failure Clinic Pharmacist [phone]

## 2021-05-25 ENCOUNTER — Telehealth (HOSPITAL_COMMUNITY): Payer: Self-pay | Admitting: *Deleted

## 2021-05-25 NOTE — Telephone Encounter (Signed)
Pts daughter left vm on pharmacy line. (See note from [facility])   "She has been having more side effects after starting to take the Uptravi 1200 mcg BID. Complaining of nausea, diarrhea and headaches. Her daughter wanted to know if there were any other recommendations now that she is intolerant to [place] and iif Dr. [name] has any suggestions."  Dr.McLean is out of the office for the week. Routed to Allena Katz, [place]

## 2021-06-01 NOTE — Telephone Encounter (Signed)
Yes I would try having her decrease back down to the previous dose that she tolerated.

## 2021-06-02 ENCOUNTER — Other Ambulatory Visit: Payer: Self-pay | Admitting: *Deleted

## 2021-06-02 DIAGNOSIS — D631 Anemia in chronic kidney disease: Secondary | ICD-10-CM

## 2021-06-11 ENCOUNTER — Inpatient Hospital Stay: Payer: Medicare PPO | Attending: Oncology

## 2021-06-14 ENCOUNTER — Ambulatory Visit: Payer: Medicare PPO | Admitting: Oncology

## 2021-06-14 ENCOUNTER — Telehealth (HOSPITAL_COMMUNITY): Payer: Self-pay | Admitting: Pharmacist

## 2021-06-14 ENCOUNTER — Inpatient Hospital Stay: Payer: Medicare PPO | Admitting: Oncology

## 2021-06-14 ENCOUNTER — Inpatient Hospital Stay: Payer: Medicare PPO

## 2021-06-14 NOTE — Telephone Encounter (Signed)
Patient's daughter called to report that her mother has not been able to tolerate the Uptravi, even at the reduced dose. She stopped the Uptravi last week due to diarrhea and severe headaches. She will continue off the Uptravi and come to clinic as scheduled on 07/16/21 with Dr. [name] to discuss next options. [name] taken off medication list.   [name], PharmD, BCPS, BCCP, CPP Heart Failure Clinic Pharmacist [phone]

## 2021-06-14 NOTE — Telephone Encounter (Signed)
I was out of the office 05/28/21-06/08/21. Pts daughter said pt stopped uptravi 3 weeks ago because of bad diarrhea and headaches at the 1268mg bid dose.

## 2021-06-25 DIAGNOSIS — I959 Hypotension, unspecified: Secondary | ICD-10-CM | POA: Diagnosis not present

## 2021-06-25 DIAGNOSIS — I10 Essential (primary) hypertension: Secondary | ICD-10-CM | POA: Diagnosis not present

## 2021-06-25 DIAGNOSIS — M6281 Muscle weakness (generalized): Secondary | ICD-10-CM | POA: Diagnosis not present

## 2021-06-25 DIAGNOSIS — R42 Dizziness and giddiness: Secondary | ICD-10-CM | POA: Diagnosis not present

## 2021-06-25 DIAGNOSIS — E782 Mixed hyperlipidemia: Secondary | ICD-10-CM | POA: Diagnosis not present

## 2021-06-25 DIAGNOSIS — G4733 Obstructive sleep apnea (adult) (pediatric): Secondary | ICD-10-CM | POA: Diagnosis not present

## 2021-06-25 DIAGNOSIS — E1122 Type 2 diabetes mellitus with diabetic chronic kidney disease: Secondary | ICD-10-CM | POA: Diagnosis not present

## 2021-07-16 ENCOUNTER — Encounter (HOSPITAL_COMMUNITY): Payer: Self-pay | Admitting: Cardiology

## 2021-07-16 ENCOUNTER — Ambulatory Visit (HOSPITAL_COMMUNITY)
Admission: RE | Admit: 2021-07-16 | Discharge: 2021-07-16 | Disposition: A | Discharge status: Home / Self Care | Payer: Medicare PPO | Source: Ambulatory Visit | Attending: Cardiology | Admitting: Cardiology

## 2021-07-16 ENCOUNTER — Other Ambulatory Visit (HOSPITAL_COMMUNITY): Payer: Self-pay

## 2021-07-16 ENCOUNTER — Telehealth (HOSPITAL_COMMUNITY): Payer: Self-pay | Admitting: Surgery

## 2021-07-16 ENCOUNTER — Other Ambulatory Visit: Payer: Self-pay

## 2021-07-16 VITALS — Wt 172.8 lb

## 2021-07-16 DIAGNOSIS — R7982 Elevated C-reactive protein (CRP): Secondary | ICD-10-CM | POA: Diagnosis not present

## 2021-07-16 DIAGNOSIS — I509 Heart failure, unspecified: Secondary | ICD-10-CM | POA: Insufficient documentation

## 2021-07-16 DIAGNOSIS — N183 Chronic kidney disease, stage 3 unspecified: Secondary | ICD-10-CM | POA: Diagnosis not present

## 2021-07-16 DIAGNOSIS — G4733 Obstructive sleep apnea (adult) (pediatric): Secondary | ICD-10-CM | POA: Diagnosis not present

## 2021-07-16 DIAGNOSIS — Z9989 Dependence on other enabling machines and devices: Secondary | ICD-10-CM | POA: Diagnosis not present

## 2021-07-16 DIAGNOSIS — J984 Other disorders of lung: Secondary | ICD-10-CM | POA: Diagnosis not present

## 2021-07-16 DIAGNOSIS — I130 Hypertensive heart and chronic kidney disease with heart failure and stage 1 through stage 4 chronic kidney disease, or unspecified chronic kidney disease: Secondary | ICD-10-CM | POA: Diagnosis not present

## 2021-07-16 DIAGNOSIS — Z7182 Exercise counseling: Secondary | ICD-10-CM | POA: Insufficient documentation

## 2021-07-16 DIAGNOSIS — I2721 Secondary pulmonary arterial hypertension: Secondary | ICD-10-CM | POA: Insufficient documentation

## 2021-07-16 DIAGNOSIS — E1122 Type 2 diabetes mellitus with diabetic chronic kidney disease: Secondary | ICD-10-CM | POA: Diagnosis not present

## 2021-07-16 DIAGNOSIS — Z7984 Long term (current) use of oral hypoglycemic drugs: Secondary | ICD-10-CM | POA: Insufficient documentation

## 2021-07-16 DIAGNOSIS — Z79899 Other long term (current) drug therapy: Secondary | ICD-10-CM | POA: Diagnosis not present

## 2021-07-16 DIAGNOSIS — R700 Elevated erythrocyte sedimentation rate: Secondary | ICD-10-CM | POA: Insufficient documentation

## 2021-07-16 DIAGNOSIS — R760 Raised antibody titer: Secondary | ICD-10-CM | POA: Insufficient documentation

## 2021-07-16 DIAGNOSIS — I5032 Chronic diastolic (congestive) heart failure: Secondary | ICD-10-CM

## 2021-07-16 DIAGNOSIS — I50810 Right heart failure, unspecified: Secondary | ICD-10-CM | POA: Diagnosis not present

## 2021-07-16 DIAGNOSIS — I272 Pulmonary hypertension, unspecified: Secondary | ICD-10-CM

## 2021-07-16 LAB — BASIC METABOLIC PANEL
Anion gap: 11 (ref 5–15)
BUN: 22 mg/dL (ref 8–23)
CO2: 23 mmol/L (ref 22–32)
Calcium: 9.1 mg/dL (ref 8.9–10.3)
Chloride: 105 mmol/L (ref 98–111)
Creatinine, Ser: 1.3 mg/dL — ABNORMAL HIGH (ref 0.44–1.00)
GFR, Estimated: 43 mL/min — ABNORMAL LOW (ref >60)
Glucose, Bld: 132 mg/dL — ABNORMAL HIGH (ref 70–99)
Potassium: 3.4 mmol/L — ABNORMAL LOW (ref 3.5–5.1)
Sodium: 139 mmol/L (ref 135–145)

## 2021-07-16 LAB — BRAIN NATRIURETIC PEPTIDE: B Natriuretic Peptide: 196.7 pg/mL — ABNORMAL HIGH (ref 0.0–100.0)

## 2021-07-16 MED ORDER — FUROSEMIDE 20 MG PO TABS: 20.0000 mg | ORAL_TABLET | Freq: Every day | ORAL | 11 refills | Status: DC

## 2021-07-16 MED ORDER — POTASSIUM CHLORIDE ER 10 MEQ PO TBCR: 10.0000 meq | EXTENDED_RELEASE_TABLET | Freq: Every day | ORAL | 11 refills | Status: DC

## 2021-07-16 NOTE — Telephone Encounter (Signed)
-----  Message from Larey Dresser, MD sent at 07/16/2021  3:50 PM EST ----- Increase K in diet

## 2021-07-16 NOTE — Telephone Encounter (Signed)
Patient called back.  Results and recommendations per Dr. [name] reviewed.  We discussed foods high in Potassium that she can incorporate into her diet going forward. She also had questions about the medication changes made during her appt today.  I reviewed those changes as well.  She acknowledges understanding.

## 2021-07-16 NOTE — Telephone Encounter (Signed)
I attempted to reach patient to review results and recommendations pre Dr. [name].  There was no answer and no ability to leave a message.  Our office will call back

## 2021-07-16 NOTE — Progress Notes (Signed)
6 Min Walk Test Completed  Pt ambulated 289.5 meters O2 Sat ranged 99%-100% on room air HR ranged 50-84

## 2021-07-16 NOTE — Patient Instructions (Signed)
Medication Changes:  Increase your lasix to 20 mg daily  Increase your potassium to 10Meq daily  We will start working on your paperwork for your [place], once approved we will order it for you.  Lab Work:  Labs done today, your results will be available in MyChart, we will contact you for abnormal readings.   Testing/Procedures:  Repeat blood work in 10 days  Referrals:  none  Special Instructions // Education:  none  Follow-Up in: 2 months  At the [facility], you and your health needs are our priority. We have a designated team specialized in the treatment of Heart Failure. This Care Team includes your primary Heart Failure Specialized Cardiologist (physician), Advanced Practice Providers (APPs- Physician Assistants and Nurse Practitioners), and Pharmacist who all work together to provide you with the care you need, when you need it.   You may see any of the following providers on your designated Care Team at your next follow up:  Dr Glori Bickers Dr Haynes Kerns, NP Lyda Jester, [place] [facility] [place], [place] [name], PharmD   Please be sure to bring in all your medications bottles to every appointment.   Need to Contact us:  If you have any questions or concerns before your next appointment please send [place] a message through [place] or call our office at [phone].    TO LEAVE A MESSAGE FOR THE NURSE SELECT OPTION 2, PLEASE LEAVE A MESSAGE INCLUDING: YOUR NAME DATE OF BIRTH CALL BACK NUMBER REASON FOR CALL**this is important as we prioritize the call backs  YOU WILL RECEIVE A CALL BACK THE SAME DAY AS LONG AS YOU CALL BEFORE 4:00 PM

## 2021-07-18 NOTE — Progress Notes (Signed)
PCP: [name], FNP HF Cardiology: Dr. [name]  76 y.o. with history of HTN, type 2 diabetes, OSA on CPAP, and pulmonary hypertension/RV failure was referred by Dr. Fletcher Anon for evaluation of pulmonary hypertension. It appears that [place] was diagnosed as early as 2018 by echo.  Echo in 5284 showed PA systolic pressure 85 mmHg.  She says that she was told that the pulmonary hypertension was due to OSA.  She has been compliant with CPAP. She reports exertional dyspnea for about 4 years now.   In 8/21, she was admitted to [facility] with CHF.  Echo showed EF 55-60%, mild LVH, D-shaped septum, severe RV dilation with moderate RV systolic dysfunction, PASP 83, moderate TR, moderate PI, dilated IVC.  RHC/LHC showed no coronary disease, severe pulmonary hypertension with PVR 7.38 and preserved cardiac output.  High resolution CT chest in 9/21 showed no evidence for fibrotic ILD.    She saw rheumatology and was given no definite rheumatological diagnosis.    Echo in 7/22 showed EF 65-70% with moderate LVH, RV moderately dilated with moderately decreased RV systolic function, mild RVH, D-shaped septum, PASP 85 mmHg, moderate PR, normal IVC.   She returns for followup of pulmonary hypertension.  She stopped Uptravi.  She was unable to tolerate even the lowest dose due to diarrhea and headache.  Breathing is worse off Uptravi (she was on it for several months).  She was able to walk in ok today but is dyspneic with stairs, inclines.  No orthopnea/PND.  Rare atypical chest pain.  No lightheadedness/dizziness. Weight is stable.   Labs (8/21): K 3.3, creatinine 1.39, ANA negative, anti-Sm negative, anti-SCL-70 negative, ANCA negative, RF elevated 52 but CCP negative, ESR 67, CRP 2.2 Labs (9/21): K 4.5, creatinine 1.8 => 1.45, anti-centromere Ab negative, BNP 309 Labs (11/21): K 4.1, creatinine 1.47, BNP 590 Labs (1/22): LDL 55, K 3.8, creatinine 1.49 Labs (5/22): LDL 74, K 4, creatinine 1.5 Labs (9/22): K 3.8,  creatinine 1.43 Labs (12/22): LDL 54, K 3.8, creatinine 1.3  6 minute walk (8/21): 154 m 6 minute walk (11/21): 183 m 6 minute walk (3/22): 351 m 6 minute walk (11/22): 381 m 6 minute walk (2/23): 290 m (off Uptravi)  PMH: 1. HTN 2. Type 2 diabetes 3. OSA: Uses CPAP.  4. GERD 5. Hypothyroidism 6. Pulmonary hypertension/RV failure: Pulmonary hypertension diagnosed 2018 by echo.  - PFTs (6/18): moderate restriction - Echo 2020 with PASP 85 mmHg.  - Echo (8/21): EF 55-60%, mild LVH, D-shaped septum, severe RV dilation with moderate RV systolic dysfunction, PASP 83, moderate TR, moderate PI, dilated IVC.  - LHC/RHC (8/21): Normal coronaries; mean RA 3, PA 82/21 mean 44, mean PCWP 14, CI 2.2, PVR 7.38 WU.  - V/Q scan (8/21): No evidence for chronic PE.  - Serologic workup: ANA negative, anti-Sm negative, anti-SCL-70 negative, ANCA negative, RF elevated 52 but CCP negative, ESR 67, CRP 2.2, anti-centromere negative - High resolution CT chest: No fibrotic ILD.  - Echo (7/22): EF 65-70% with moderate LVH, RV moderately dilated with moderately decreased RV systolic function, mild RVH, D-shaped septum, PASP 85 mmHg, moderate PR, normal IVC.  7. CKD stage 3.   Social History   Socioeconomic History   Marital status: Married    Spouse name: [name]   Number of children: 3   Years of education: 14   Highest education level: Not on file  Occupational History    Comment: work part time, day care  Tobacco Use   Smoking  status: Never   Smokeless tobacco: Never  Vaping Use   Vaping Use: Never used  Substance and Sexual Activity   Alcohol use: No    Alcohol/week: 0.0 standard drinks   Drug use: No   Sexual activity: Never  Other Topics Concern   Not on file  Social History Narrative   Lives with husband   Caffeine use- coffee 2 cups daily, Coke once a week, green tea daily   Social Determinants of Health   Financial Resource Strain: Not on file  Food Insecurity: Not on file   Transportation Needs: Not on file  Physical Activity: Not on file  Stress: Not on file  Social Connections: Not on file  Intimate Partner Violence: Not on file   Family History  Problem Relation Age of Onset   Diabetes Mother    Hypertension Mother    Stroke Mother    Heart disease Mother        MI   Cancer Father        pancreatic   Stroke Father    Hypertension Sister    Diabetes Brother    Diabetes Maternal Grandfather    Diabetes Sister    Diabetes Sister    Diabetes Brother    Diabetes Brother    ROS: All systems reviewed and negative except as per HPI.   Current Outpatient Medications  Medication Sig Dispense Refill   ambrisentan (LETAIRIS) 10 MG tablet Take 1 tablet (10 mg total) by mouth daily. 30 tablet 3   amLODipine (NORVASC) 10 MG tablet Take 1 tablet (10 mg total) by mouth daily. 90 tablet 3   aspirin 81 MG tablet Take 81 mg by mouth daily.     Cholecalciferol (VITAMIN D3) 125 MCG (5000 UT) CAPS Take 1 capsule by mouth daily.     clonazePAM (KLONOPIN) 1 MG tablet Take 1 mg by mouth daily.     clotrimazole-betamethasone (LOTRISONE) cream APPLY TO FEET TOPICALLY TWICE A DAY 45 g 1   dapagliflozin propanediol (FARXIGA) 10 MG TABS tablet Take 1 tablet (10 mg total) by mouth daily before breakfast. 90 tablet 3   escitalopram (LEXAPRO) 10 MG tablet Take 10 mg by mouth daily.     esomeprazole (NEXIUM) 40 MG capsule Take 40 mg by mouth daily at 12 noon.     Fe Fum-FA-B Cmp-C-Zn-Mg-Mn-Cu (HEMOCYTE PLUS) 106-1 MG CAPS Take 1 capsule by mouth daily.  6   hydrALAZINE (APRESOLINE) 50 MG tablet Take 1 tablet (50 mg total) by mouth every 8 (eight) hours. 90 tablet 0   insulin aspart (NOVOLOG) 100 UNIT/ML FlexPen Inject 4 Units into the skin 3 (three) times daily with meals. (Patient taking differently: Inject 4 Units into the skin 3 (three) times daily with meals. Only for BS greater than 200) 15 mL 0   insulin degludec (TRESIBA) 200 UNIT/ML FlexTouch Pen Inject 26 Units into  the skin at bedtime.     Insulin Pen Needle (B-D UF III MINI PEN NEEDLES) 31G X 5 MM MISC 4 (four) times daily     levothyroxine (SYNTHROID) 75 MCG tablet Take 75 mcg by mouth daily before breakfast.     loratadine (CLARITIN) 10 MG tablet Take 10 mg by mouth daily.      meclizine (ANTIVERT) 25 MG tablet Take 25 mg by mouth 3 (three) times daily as needed for dizziness.      metoprolol succinate (TOPROL-XL) 50 MG 24 hr tablet Take 50 mg by mouth daily. Take with or immediately following a  meal.     Multiple Vitamins-Minerals (MULTI ADULT GUMMIES) CHEW Chew by mouth daily.     nitroGLYCERIN (NITROSTAT) 0.4 MG SL tablet      rosuvastatin (CRESTOR) 10 MG tablet Take 10 mg by mouth daily.     tadalafil, PAH, (ADCIRCA) 20 MG tablet Take 2 tablets (40 mg total) by mouth daily. 60 tablet 11   furosemide (LASIX) 20 MG tablet Take 1 tablet (20 mg total) by mouth daily. 30 tablet 11   potassium chloride (KLOR-CON) 10 MEQ tablet Take 1 tablet (10 mEq total) by mouth daily. 30 tablet 11   No current facility-administered medications for this encounter.   Wt 78.4 kg (172 lb 12.8 oz)    LMP  (LMP Unknown)    BMI 26.66 kg/m  General: NAD Neck: JVP 8-9 cm, no thyromegaly or thyroid nodule.  Lungs: Clear to auscultation bilaterally with normal respiratory effort. CV: Nondisplaced PMI.  Heart regular S1/S2, no S3/S4, 1/6 HSM LLSB.  Trace ankle edema.  No carotid bruit.  Normal pedal pulses.  Abdomen: Soft, nontender, no hepatosplenomegaly, no distention.  Skin: Intact without lesions or rashes.  Neurologic: Alert and oriented x 3.  Psych: Normal affect. Extremities: No clubbing or cyanosis.  HEENT: Normal.   Assessment/Plan: 1. HTN: BP controlled.  - Continue amlodipine.  2. OSA: Continue CPAP.    3. Pulmonary HTN/RV failure: Echo in 8/21 with EF 55-60%, mild LVH, D-shaped septum, severe RV dilation with moderate RV systolic dysfunction, PASP 83, moderate TR, moderate PI, dilated IVC.  Suspect  long-standing RV dysfunction based on appearance of echo. [place] in 8/21 with severe pulmonary arterial hypertension, preserved cardiac output. PFTs from 6/18 showed moderate restriction.  V/Q scan from 8/21 showed no evidence for chronic PE. She has treated OSA.  Serologic workup was negative except for elevated CRP and ESR and elevated RF (normal CCP).  High resolution CT chest did not show evidence for ILD. I suspect long-standing group 1 PAH. Repeat echo in 7/22 showed EF 65-70% with moderate LVH, RV moderately dilated with moderately decreased RV systolic function, mild RVH, D-shaped septum, PASP 85 mmHg, moderate PR, normal IVC. Though echo still showed significant RV dysfunction, she symptomatically has felt better on PH meds.  She had to stop Uptravi due to intractable diarrhea and headache.  Breathing has worsened off Uptravi. Mild volume overload on exam, NYHA class III.  6 minute walk done today was also worse off Uptravi.  - Increase Lasix to 20 mg daily and KCl to 10 mEq daily, check BMET and BNP today and BMET in 10 days.  - Continue Farxiga 10 mg daily.  - Continue ambrisentan 10.  - Continue tadalafil 40 mg daily.   - Unable to tolerate Uptravi but when she was on it, she had much better 6 minute walk and improved breathing.  I will try her on Tyvaso DPI to see if she can tolerate inhaled prostaglandin agonist better.  - Continue CPAP (OSA likely contributes but certainly cannot explain the extent of her [place]).  - Continue exercise.  4. Pulmonary insufficiency: Moderate on 7/22 echo.    Followup in 2 months   Loralie Champagne 07/18/2021

## 2021-07-19 ENCOUNTER — Telehealth (HOSPITAL_COMMUNITY): Payer: Self-pay

## 2021-07-21 ENCOUNTER — Telehealth (HOSPITAL_COMMUNITY): Payer: Self-pay | Admitting: Pharmacist

## 2021-07-21 NOTE — Telephone Encounter (Signed)
Patient Advocate Encounter   Received notification from [facility] that prior authorization for Tyvaso DPI is required.   PA submitted on CoverMyMeds Key [id number] Status is pending   Will continue to follow.   [name], PharmD, BCPS, BCCP, CPP Heart Failure Clinic Pharmacist [phone]

## 2021-07-22 NOTE — Telephone Encounter (Signed)
Advanced Heart Failure Patient Advocate Encounter  Prior Authorization for Tyvaso DPI has been approved.    Effective dates: 07/22/21 through 06/05/22  [name], PharmD, BCPS, BCCP, CPP Heart Failure Clinic Pharmacist [phone]

## 2021-07-22 NOTE — Telephone Encounter (Signed)
Sent in enrollment application to [facility] for Tyvaso DPI.  ? ?Application pending, will continue to follow. ? ? ?Pegge Cumberledge, PharmD, BCPS, BCCP, CPP ?Heart Failure Clinic Pharmacist ?[phone] ? ?

## 2021-07-23 DIAGNOSIS — E782 Mixed hyperlipidemia: Secondary | ICD-10-CM | POA: Diagnosis not present

## 2021-07-23 DIAGNOSIS — R55 Syncope and collapse: Secondary | ICD-10-CM | POA: Diagnosis not present

## 2021-07-23 DIAGNOSIS — E1122 Type 2 diabetes mellitus with diabetic chronic kidney disease: Secondary | ICD-10-CM | POA: Diagnosis not present

## 2021-07-23 DIAGNOSIS — I10 Essential (primary) hypertension: Secondary | ICD-10-CM | POA: Diagnosis not present

## 2021-07-23 DIAGNOSIS — R42 Dizziness and giddiness: Secondary | ICD-10-CM | POA: Diagnosis not present

## 2021-07-23 DIAGNOSIS — G4733 Obstructive sleep apnea (adult) (pediatric): Secondary | ICD-10-CM | POA: Diagnosis not present

## 2021-07-23 DIAGNOSIS — Z23 Encounter for immunization: Secondary | ICD-10-CM | POA: Diagnosis not present

## 2021-07-23 DIAGNOSIS — M6281 Muscle weakness (generalized): Secondary | ICD-10-CM | POA: Diagnosis not present

## 2021-07-23 DIAGNOSIS — E039 Hypothyroidism, unspecified: Secondary | ICD-10-CM | POA: Diagnosis not present

## 2021-07-27 NOTE — Telephone Encounter (Signed)
Received message from [place]:   Patient Jamie Murray [birth year] has been cleared for Tyvaso DPI. Nursing and pharmacy will be working with the patient to get the start of care set up.    [name], PharmD, BCPS, BCCP, CPP Heart Failure Clinic Pharmacist [phone]

## 2021-08-03 ENCOUNTER — Other Ambulatory Visit (HOSPITAL_COMMUNITY): Payer: Self-pay | Admitting: *Deleted

## 2021-08-03 ENCOUNTER — Telehealth (HOSPITAL_COMMUNITY): Payer: Self-pay | Admitting: Pharmacy Technician

## 2021-08-03 DIAGNOSIS — I272 Pulmonary hypertension, unspecified: Secondary | ICD-10-CM

## 2021-08-03 MED ORDER — TADALAFIL (PAH) 20 MG PO TABS: 40.0000 mg | ORAL_TABLET | Freq: Every day | ORAL | 11 refills | Status: DC

## 2021-08-03 NOTE — Telephone Encounter (Signed)
Advanced Heart Failure Patient Advocate Encounter  The patient was approved for a TAF [place] grant. This grant will cover the cost of Ambrisentan, Tadalafil regardless of cost. Fatima Sanger information emailed to patient's daughter as well. Eligibility 06/06/21-06/05/22   Member Number [id number] Group Number [id number]   Claims Processing Information Pharmacy Claims PCN: AS BIN: [id number] Processing: 08 Phone: [phone] Fax: [place], CPhT

## 2021-08-04 ENCOUNTER — Other Ambulatory Visit (HOSPITAL_COMMUNITY): Payer: Self-pay

## 2021-09-22 ENCOUNTER — Encounter (HOSPITAL_COMMUNITY): Payer: Medicare PPO | Admitting: Cardiology

## 2021-09-29 ENCOUNTER — Other Ambulatory Visit (HOSPITAL_COMMUNITY): Payer: Self-pay | Admitting: Cardiology

## 2021-09-29 MED ORDER — FUROSEMIDE 20 MG PO TABS: 20.0000 mg | ORAL_TABLET | Freq: Every day | ORAL | 3 refills | Status: DC

## 2021-10-03 DIAGNOSIS — I10 Essential (primary) hypertension: Secondary | ICD-10-CM | POA: Diagnosis not present

## 2021-10-03 DIAGNOSIS — E039 Hypothyroidism, unspecified: Secondary | ICD-10-CM | POA: Diagnosis not present

## 2021-10-03 DIAGNOSIS — E782 Mixed hyperlipidemia: Secondary | ICD-10-CM | POA: Diagnosis not present

## 2021-10-05 ENCOUNTER — Telehealth (HOSPITAL_COMMUNITY): Payer: Self-pay | Admitting: Pharmacist

## 2021-10-05 NOTE — Telephone Encounter (Signed)
Received message from [facility]: ? ?Pt Jamie Murray DOB [birth year] requested to restart Tyvaso DPI.  Pt spouse is going thru chemotherapy and just felt overwhelmed at the initial medication start.  So if ok to restart I am seeking verbal to restart pt. on Tyvaso DPI. ? ?Provided verbal order for Tyvaso DPI restart.   ? ?[name], PharmD, BCPS, BCCP, CPP ?Heart Failure Clinic Pharmacist ?[phone] ?   ? ?

## 2021-11-05 ENCOUNTER — Ambulatory Visit (HOSPITAL_COMMUNITY)
Admission: RE | Admit: 2021-11-05 | Discharge: 2021-11-05 | Disposition: A | Discharge status: Home / Self Care | Payer: Medicare PPO | Source: Ambulatory Visit | Attending: Cardiology | Admitting: Cardiology

## 2021-11-05 VITALS — HR 59 | Ht 67.5 in | Wt 167.4 lb

## 2021-11-05 DIAGNOSIS — I130 Hypertensive heart and chronic kidney disease with heart failure and stage 1 through stage 4 chronic kidney disease, or unspecified chronic kidney disease: Secondary | ICD-10-CM | POA: Diagnosis not present

## 2021-11-05 DIAGNOSIS — I50810 Right heart failure, unspecified: Secondary | ICD-10-CM | POA: Diagnosis not present

## 2021-11-05 DIAGNOSIS — G4733 Obstructive sleep apnea (adult) (pediatric): Secondary | ICD-10-CM | POA: Diagnosis not present

## 2021-11-05 DIAGNOSIS — J984 Other disorders of lung: Secondary | ICD-10-CM | POA: Diagnosis not present

## 2021-11-05 DIAGNOSIS — Z7984 Long term (current) use of oral hypoglycemic drugs: Secondary | ICD-10-CM | POA: Diagnosis not present

## 2021-11-05 DIAGNOSIS — I272 Pulmonary hypertension, unspecified: Secondary | ICD-10-CM

## 2021-11-05 DIAGNOSIS — F32A Depression, unspecified: Secondary | ICD-10-CM | POA: Diagnosis not present

## 2021-11-05 DIAGNOSIS — F419 Anxiety disorder, unspecified: Secondary | ICD-10-CM | POA: Diagnosis not present

## 2021-11-05 DIAGNOSIS — I129 Hypertensive chronic kidney disease with stage 1 through stage 4 chronic kidney disease, or unspecified chronic kidney disease: Secondary | ICD-10-CM | POA: Diagnosis present

## 2021-11-05 DIAGNOSIS — Z79899 Other long term (current) drug therapy: Secondary | ICD-10-CM | POA: Diagnosis not present

## 2021-11-05 DIAGNOSIS — E1122 Type 2 diabetes mellitus with diabetic chronic kidney disease: Secondary | ICD-10-CM | POA: Insufficient documentation

## 2021-11-05 DIAGNOSIS — N183 Chronic kidney disease, stage 3 unspecified: Secondary | ICD-10-CM | POA: Diagnosis not present

## 2021-11-05 LAB — BRAIN NATRIURETIC PEPTIDE: B Natriuretic Peptide: 861.6 pg/mL — ABNORMAL HIGH (ref 0.0–100.0)

## 2021-11-05 LAB — BASIC METABOLIC PANEL
Anion gap: 9 (ref 5–15)
BUN: 33 mg/dL — ABNORMAL HIGH (ref 8–23)
CO2: 24 mmol/L (ref 22–32)
Calcium: 9.6 mg/dL (ref 8.9–10.3)
Chloride: 108 mmol/L (ref 98–111)
Creatinine, Ser: 1.76 mg/dL — ABNORMAL HIGH (ref 0.44–1.00)
GFR, Estimated: 30 mL/min — ABNORMAL LOW (ref >60)
Glucose, Bld: 156 mg/dL — ABNORMAL HIGH (ref 70–99)
Potassium: 3.9 mmol/L (ref 3.5–5.1)
Sodium: 141 mmol/L (ref 135–145)

## 2021-11-05 NOTE — Patient Instructions (Signed)
Call the office when you want to start Tyvaso.  Labs done today, your results will be available in MyChart, we will contact you for abnormal readings.  Your physician has requested that you have an echocardiogram. Echocardiography is a painless test that uses sound waves to create images of your heart. It provides your doctor with information about the size and shape of your heart and how well your heart's chambers and valves are working. This procedure takes approximately one hour. There are no restrictions for this procedure.  Your physician recommends that you schedule a follow-up appointment in: 3 months with an echocardiogram   If you have any questions or concerns before your next appointment please send [place] a message through [place] or call our office at [phone].    TO LEAVE A MESSAGE FOR THE NURSE SELECT OPTION 2, PLEASE LEAVE A MESSAGE INCLUDING: YOUR NAME DATE OF BIRTH CALL BACK NUMBER REASON FOR CALL**this is important as we prioritize the call backs  YOU WILL RECEIVE A CALL BACK THE SAME DAY AS LONG AS YOU CALL BEFORE 4:00 PM  At the [facility], you and your health needs are our priority. As part of our continuing mission to provide you with exceptional heart care, we have created designated Provider Care Teams. These Care Teams include your primary Cardiologist (physician) and Advanced Practice Providers (APPs- Physician Assistants and Nurse Practitioners) who all work together to provide you with the care you need, when you need it.   You may see any of the following providers on your designated Care Team at your next follow up: Dr Glori Bickers Dr Haynes Kerns, NP Lyda Jester, [place] [facility] [place], [place] [name], PharmD   Please be sure to bring in all your medications bottles to every appointment.

## 2021-11-05 NOTE — Progress Notes (Signed)
  Heart and Vascular Care Navigation  11/05/2021  Jamie Murray [birth year] [id number]  Reason for Referral: grief/depression resources      Engaged with patient face to face for initial visit for Heart and Vascular Care Coordination.                                                                                                   Assessment:      CSW consulted to speak with pt about mental health resources to help her with coping with her husbands terminal illness.  Pt dtrs at bedside and express concerns with pt taking care of herself- feel as if she is neglecting her needs in order to be there for her husband and that she is getting burned out.  Pt agreeable to getting mental health counseling or be set up with support groups through the [facility].  CSW mailing list of in network mental health providers and sent referral to [facility] CSW to follow up with pt regarding resources.                                HRT/VAS Care Coordination     Home Assistive Devices/Equipment Cane (specify quad or straight); CBG Meter       Social History:                                                                             SDOH Screenings   Alcohol Screen: Not on file  Depression (PHQ2-9): Low Risk    PHQ-2 Score: 1  Financial Resource Strain: Not on file  Food Insecurity: Not on file  Housing: Not on file  Physical Activity: Not on file  Social Connections: Not on file  Stress: Not on file  Tobacco Use: Low Risk    Smoking Tobacco Use: Never   Smokeless Tobacco Use: Never   Passive Exposure: Not on file  Transportation Needs: Not on file    Other Care Navigation Interventions:     Patient expressed [place] concerns Yes, Referred to:  in network counseling options  Patient Referred to: [place]   Follow-up plan:    Pt to follow up with [facility] CSW and with in network therapy providers to help with grief and coping  mechanism to deal with her husband decline in health.  Will continue to follow and assist as needed  Jorge Ny, [facility] Desk#: [phone] Cell#: [phone]

## 2021-11-07 NOTE — Progress Notes (Signed)
PCP: [name], FNP HF Cardiology: Dr. [name]  76 y.o. with history of HTN, type 2 diabetes, OSA on CPAP, and pulmonary hypertension/RV failure was referred by Dr. Fletcher Anon for evaluation of pulmonary hypertension. It appears that [place] was diagnosed as early as 2018 by echo.  Echo in 6283 showed PA systolic pressure 85 mmHg.  She says that she was told that the pulmonary hypertension was due to OSA.  She has been compliant with CPAP. She reports exertional dyspnea for about 4 years now.   In 8/21, she was admitted to [facility] with CHF.  Echo showed EF 55-60%, mild LVH, D-shaped septum, severe RV dilation with moderate RV systolic dysfunction, PASP 83, moderate TR, moderate PI, dilated IVC.  RHC/LHC showed no coronary disease, severe pulmonary hypertension with PVR 7.38 and preserved cardiac output.  High resolution CT chest in 9/21 showed no evidence for fibrotic ILD.    She saw rheumatology and was given no definite rheumatological diagnosis.    Echo in 7/22 showed EF 65-70% with moderate LVH, RV moderately dilated with moderately decreased RV systolic function, mild RVH, D-shaped septum, PASP 85 mmHg, moderate PR, normal IVC.   She returns for followup of pulmonary hypertension.  She is off Uptravi, unable to tolerate even the lowest dose due to diarrhea and headache.  Breathing has been worse off Uptravi.  She gets easily fatigued.  She says that she is "weak" but not particularly short of breath walking around her house (does not do much outside the house).  Her husband has been diagnosed with leukemia and is nearing hospice, she has been under a lot of stress trying to care for him.  She feels very anxious.  She is on clonazepam.  Weight is down 5 lbs, she is not eating well.    Labs (8/21): K 3.3, creatinine 1.39, ANA negative, anti-Sm negative, anti-SCL-70 negative, ANCA negative, RF elevated 52 but CCP negative, ESR 67, CRP 2.2 Labs (9/21): K 4.5, creatinine 1.8 => 1.45, anti-centromere Ab  negative, BNP 309 Labs (11/21): K 4.1, creatinine 1.47, BNP 590 Labs (1/22): LDL 55, K 3.8, creatinine 1.49 Labs (5/22): LDL 74, K 4, creatinine 1.5 Labs (9/22): K 3.8, creatinine 1.43 Labs (12/22): LDL 54, K 3.8, creatinine 1.3 Labs (2/23): BNP 197, K 3.4, creatinine 1.3  6 minute walk (8/21): 154 m 6 minute walk (11/21): 183 m 6 minute walk (3/22): 351 m 6 minute walk (11/22): 381 m 6 minute walk (2/23): 290 m (off Uptravi)  PMH: 1. HTN 2. Type 2 diabetes 3. OSA: Uses CPAP.  4. GERD 5. Hypothyroidism 6. Pulmonary hypertension/RV failure: Pulmonary hypertension diagnosed 2018 by echo.  - PFTs (6/18): moderate restriction - Echo 2020 with PASP 85 mmHg.  - Echo (8/21): EF 55-60%, mild LVH, D-shaped septum, severe RV dilation with moderate RV systolic dysfunction, PASP 83, moderate TR, moderate PI, dilated IVC.  - LHC/RHC (8/21): Normal coronaries; mean RA 3, PA 82/21 mean 44, mean PCWP 14, CI 2.2, PVR 7.38 WU.  - V/Q scan (8/21): No evidence for chronic PE.  - Serologic workup: ANA negative, anti-Sm negative, anti-SCL-70 negative, ANCA negative, RF elevated 52 but CCP negative, ESR 67, CRP 2.2, anti-centromere negative - High resolution CT chest: No fibrotic ILD.  - Echo (7/22): EF 65-70% with moderate LVH, RV moderately dilated with moderately decreased RV systolic function, mild RVH, D-shaped septum, PASP 85 mmHg, moderate PR, normal IVC.  7. CKD stage 3.   Social History   Socioeconomic History  Marital status: Married    Spouse name: [name]   Number of children: 3   Years of education: 14   Highest education level: Not on file  Occupational History    Comment: work part time, day care  Tobacco Use   Smoking status: Never   Smokeless tobacco: Never  Vaping Use   Vaping Use: Never used  Substance and Sexual Activity   Alcohol use: No    Alcohol/week: 0.0 standard drinks   Drug use: No   Sexual activity: Never  Other Topics Concern   Not on file  Social History  Narrative   Lives with husband   Caffeine use- coffee 2 cups daily, Coke once a week, green tea daily   Social Determinants of Health   Financial Resource Strain: Not on file  Food Insecurity: Not on file  Transportation Needs: Not on file  Physical Activity: Not on file  Stress: Not on file  Social Connections: Not on file  Intimate Partner Violence: Not on file   Family History  Problem Relation Age of Onset   Diabetes Mother    Hypertension Mother    Stroke Mother    Heart disease Mother        MI   Cancer Father        pancreatic   Stroke Father    Hypertension Sister    Diabetes Brother    Diabetes Maternal Grandfather    Diabetes Sister    Diabetes Sister    Diabetes Brother    Diabetes Brother    ROS: All systems reviewed and negative except as per HPI.   Current Outpatient Medications  Medication Sig Dispense Refill   ambrisentan (LETAIRIS) 10 MG tablet Take 1 tablet (10 mg total) by mouth daily. 30 tablet 3   amLODipine (NORVASC) 10 MG tablet Take 1 tablet (10 mg total) by mouth daily. 90 tablet 3   aspirin 81 MG tablet Take 81 mg by mouth daily.     Cholecalciferol (VITAMIN D3) 125 MCG (5000 UT) CAPS Take 1 capsule by mouth daily.     clonazePAM (KLONOPIN) 1 MG tablet Take 1 mg by mouth 2 (two) times daily as needed.     clotrimazole-betamethasone (LOTRISONE) cream APPLY TO FEET TOPICALLY TWICE A DAY 45 g 1   dapagliflozin propanediol (FARXIGA) 10 MG TABS tablet Take 1 tablet (10 mg total) by mouth daily before breakfast. 90 tablet 3   escitalopram (LEXAPRO) 10 MG tablet Take 10 mg by mouth daily.     esomeprazole (NEXIUM) 40 MG capsule Take 40 mg by mouth daily at 12 noon.     furosemide (LASIX) 20 MG tablet Take 1 tablet (20 mg total) by mouth daily. 90 tablet 3   hydrALAZINE (APRESOLINE) 50 MG tablet Take 1 tablet (50 mg total) by mouth every 8 (eight) hours. 90 tablet 0   insulin aspart (NOVOLOG) 100 UNIT/ML FlexPen Inject 4 Units into the skin 3 (three)  times daily with meals. (Patient taking differently: Inject 4 Units into the skin 3 (three) times daily with meals. Only for BS greater than 200) 15 mL 0   insulin degludec (TRESIBA) 200 UNIT/ML FlexTouch Pen Inject 26 Units into the skin at bedtime.     Insulin Pen Needle (B-D UF III MINI PEN NEEDLES) 31G X 5 MM MISC 4 (four) times daily     levothyroxine (SYNTHROID) 75 MCG tablet Take 75 mcg by mouth daily before breakfast.     loratadine (CLARITIN) 10 MG tablet Take  10 mg by mouth daily.      meclizine (ANTIVERT) 25 MG tablet Take 25 mg by mouth 3 (three) times daily as needed for dizziness.      metoprolol succinate (TOPROL-XL) 50 MG 24 hr tablet Take 50 mg by mouth daily. Take with or immediately following a meal.     Multiple Vitamins-Minerals (MULTI ADULT GUMMIES) CHEW Chew by mouth daily.     nitroGLYCERIN (NITROSTAT) 0.4 MG SL tablet      rosuvastatin (CRESTOR) 10 MG tablet Take 10 mg by mouth daily.     tadalafil, PAH, (ADCIRCA) 20 MG tablet Take 2 tablets (40 mg total) by mouth daily. 60 tablet 11   Fe Fum-FA-B Cmp-C-Zn-Mg-Mn-Cu (HEMOCYTE PLUS) 106-1 MG CAPS Take 1 capsule by mouth daily. (Patient not taking: Reported on 11/05/2021)  6   potassium chloride (KLOR-CON) 10 MEQ tablet Take 1 tablet (10 mEq total) by mouth daily. (Patient not taking: Reported on 11/05/2021) 30 tablet 11   Treprostinil (TYVASO DPI TITRATION KIT) 16 & 32 & 48 MCG POWD Inhale into the lungs. Inhale 1 breath 4 times per day (Patient not taking: Reported on 11/05/2021)     No current facility-administered medications for this encounter.   Pulse (!) 59   Ht 5' 7.5" (1.715 m)   Wt 75.9 kg (167 lb 6.4 oz)   LMP  (LMP Unknown)   SpO2 98%   BMI 25.83 kg/m  General: NAD Neck: No JVD, no thyromegaly or thyroid nodule.  Lungs: Clear to auscultation bilaterally with normal respiratory effort. CV: Nondisplaced PMI.  Heart regular S1/S2, no S3/S4, no murmur.  No peripheral edema.  No carotid bruit.  Normal pedal pulses.   Abdomen: Soft, nontender, no hepatosplenomegaly, no distention.  Skin: Intact without lesions or rashes.  Neurologic: Alert and oriented x 3.  Psych: Normal affect. Extremities: No clubbing or cyanosis.  HEENT: Normal.   Assessment/Plan: 1. HTN: BP controlled.  - Continue amlodipine.  2. OSA: Continue CPAP.    3. Pulmonary HTN/RV failure: Echo in 8/21 with EF 55-60%, mild LVH, D-shaped septum, severe RV dilation with moderate RV systolic dysfunction, PASP 83, moderate TR, moderate PI, dilated IVC.  Suspect long-standing RV dysfunction based on appearance of echo. [place] in 8/21 with severe pulmonary arterial hypertension, preserved cardiac output. PFTs from 6/18 showed moderate restriction.  V/Q scan from 8/21 showed no evidence for chronic PE. She has treated OSA.  Serologic workup was negative except for elevated CRP and ESR and elevated RF (normal CCP).  High resolution CT chest did not show evidence for ILD. I suspect long-standing group 1 PAH. Repeat echo in 7/22 showed EF 65-70% with moderate LVH, RV moderately dilated with moderately decreased RV systolic function, mild RVH, D-shaped septum, PASP 85 mmHg, moderate PR, normal IVC. Though echo still showed significant RV dysfunction, she symptomatically has felt better on PH meds.  She had to stop Uptravi due to intractable diarrhea and headache.  Breathing has worsened off Uptravi. She is not volume overloaded, NYHA class III (primarily due to fatigue).   - No 6 minute walk today, she has not started Tyvaso yet.   - Continue Lasix 20 mg daily, BMET/BNP today.   - Continue Farxiga 10 mg daily.  - Continue ambrisentan 10.  - Continue tadalafil 40 mg daily.   - Unable to tolerate Uptravi but when she was on it, she had much better 6 minute walk and improved breathing.  I want to try her on Tyvaso DPI to see  if she can tolerate inhaled prostaglandin agonist better. However, she does not want to start it yet => she is feeling overwhelmed by her  husband's illness and caring for him.  She will call us when she is ready to start Tyvaso.  - Continue CPAP (OSA likely contributes but certainly cannot explain the extent of her [place]).  - I will arrange for repeat echo. 4. Pulmonary insufficiency: Moderate on 7/22 echo.   5. Depression/Anxiety: Patient would like a psychologist/psychiatrist to talk with. Very anxious/depressed due to her husband's condition. Will make referral.   Followup in 3 months with echo.   Loralie Champagne 11/07/2021

## 2021-11-09 ENCOUNTER — Telehealth (HOSPITAL_COMMUNITY): Payer: Self-pay

## 2021-11-09 ENCOUNTER — Encounter (HOSPITAL_COMMUNITY): Payer: Self-pay | Admitting: Cardiology

## 2021-11-09 DIAGNOSIS — F32A Depression, unspecified: Secondary | ICD-10-CM | POA: Diagnosis not present

## 2021-11-09 DIAGNOSIS — F411 Generalized anxiety disorder: Secondary | ICD-10-CM | POA: Diagnosis not present

## 2021-11-09 DIAGNOSIS — E782 Mixed hyperlipidemia: Secondary | ICD-10-CM | POA: Diagnosis not present

## 2021-11-09 DIAGNOSIS — E1122 Type 2 diabetes mellitus with diabetic chronic kidney disease: Secondary | ICD-10-CM | POA: Diagnosis not present

## 2021-11-09 DIAGNOSIS — I5032 Chronic diastolic (congestive) heart failure: Secondary | ICD-10-CM

## 2021-11-09 DIAGNOSIS — G4733 Obstructive sleep apnea (adult) (pediatric): Secondary | ICD-10-CM | POA: Diagnosis not present

## 2021-11-09 DIAGNOSIS — I10 Essential (primary) hypertension: Secondary | ICD-10-CM | POA: Diagnosis not present

## 2021-11-09 MED ORDER — POTASSIUM CHLORIDE ER 10 MEQ PO TBCR: 10.0000 meq | EXTENDED_RELEASE_TABLET | ORAL | 11 refills | Status: DC

## 2021-11-09 MED ORDER — FUROSEMIDE 20 MG PO TABS: 20.0000 mg | ORAL_TABLET | ORAL | 3 refills | Status: DC

## 2021-11-09 NOTE — Telephone Encounter (Addendum)
Pt aware, agreeable, and verbalized understanding Labs ordered for [place]   ----- Message from Larey Dresser, MD sent at 11/05/2021  4:02 PM EDT ----- BUN and creatinine higher, decrease Lasix and KCl to every other day rather than daily. BMET 10 days.

## 2021-11-12 ENCOUNTER — Telehealth (HOSPITAL_COMMUNITY): Payer: Self-pay | Admitting: Licensed Clinical Social Worker

## 2021-11-12 ENCOUNTER — Other Ambulatory Visit: Payer: Self-pay

## 2021-11-12 DIAGNOSIS — I5032 Chronic diastolic (congestive) heart failure: Secondary | ICD-10-CM

## 2021-11-12 DIAGNOSIS — E1142 Type 2 diabetes mellitus with diabetic polyneuropathy: Secondary | ICD-10-CM | POA: Diagnosis not present

## 2021-11-12 DIAGNOSIS — I152 Hypertension secondary to endocrine disorders: Secondary | ICD-10-CM | POA: Diagnosis not present

## 2021-11-12 DIAGNOSIS — Z794 Long term (current) use of insulin: Secondary | ICD-10-CM | POA: Diagnosis not present

## 2021-11-12 DIAGNOSIS — E782 Mixed hyperlipidemia: Secondary | ICD-10-CM | POA: Diagnosis not present

## 2021-11-12 DIAGNOSIS — E1159 Type 2 diabetes mellitus with other circulatory complications: Secondary | ICD-10-CM | POA: Diagnosis not present

## 2021-11-12 NOTE — Patient Outreach (Signed)
[place] [facility]) Care Management  11/12/2021  Jamie Murray [birth year] [id number]   Received updated referral that patient will be followed up by [facility] CSW. Made corrections and advised [employer], LCSW of changes. No further follow up needed by [facility] CM.  [place] Management Assistant [phone]

## 2021-11-12 NOTE — Telephone Encounter (Signed)
CSW called pt to check in regarding mental health resources.  Pt reports she is still interested and is agreeable to CSW making [facility] referral to assist with this- order placed.  CSW sent message to [facility] CSW to follow up with pt regarding any caregiver support that might be available.  Pt not feeling well today and about to go to MD so not wanting to continue speaking at this time  Will continue to follow and assist as needed  Jorge Ny, [facility] Desk#: [phone] Cell#: [phone]

## 2021-11-12 NOTE — Patient Outreach (Signed)
[place] [facility]) Care Management  11/12/2021  Jamie Murray [birth year] [id number]   Received [place] referral for LCSW to assist with patient needs. Reviewed referral and sent to [facility], LCSW for follow up.  [place] Management Assistant [phone]

## 2021-11-16 ENCOUNTER — Ambulatory Visit: Payer: Medicare PPO | Admitting: *Deleted

## 2021-11-18 ENCOUNTER — Other Ambulatory Visit
Admission: RE | Admit: 2021-11-18 | Discharge: 2021-11-18 | Disposition: A | Discharge status: Home / Self Care | Payer: Medicare PPO | Attending: Cardiology | Admitting: Cardiology

## 2021-11-18 DIAGNOSIS — I5032 Chronic diastolic (congestive) heart failure: Secondary | ICD-10-CM

## 2021-11-18 LAB — BASIC METABOLIC PANEL
Anion gap: 8 (ref 5–15)
BUN: 31 mg/dL — ABNORMAL HIGH (ref 8–23)
CO2: 21 mmol/L — ABNORMAL LOW (ref 22–32)
Calcium: 9.2 mg/dL (ref 8.9–10.3)
Chloride: 113 mmol/L — ABNORMAL HIGH (ref 98–111)
Creatinine, Ser: 1.59 mg/dL — ABNORMAL HIGH (ref 0.44–1.00)
GFR, Estimated: 34 mL/min — ABNORMAL LOW (ref >60)
Glucose, Bld: 104 mg/dL — ABNORMAL HIGH (ref 70–99)
Potassium: 3.4 mmol/L — ABNORMAL LOW (ref 3.5–5.1)
Sodium: 142 mmol/L (ref 135–145)

## 2021-11-19 ENCOUNTER — Telehealth (HOSPITAL_COMMUNITY): Payer: Self-pay | Admitting: Surgery

## 2021-11-19 DIAGNOSIS — E1142 Type 2 diabetes mellitus with diabetic polyneuropathy: Secondary | ICD-10-CM | POA: Diagnosis not present

## 2021-11-19 MED ORDER — POTASSIUM CHLORIDE ER 10 MEQ PO TBCR: 20.0000 meq | EXTENDED_RELEASE_TABLET | Freq: Every day | ORAL | 11 refills | Status: DC

## 2021-11-19 NOTE — Telephone Encounter (Signed)
I called patient and reviewed results and recommendations per provider.  Patient currently takes 1 Meq every other day --I instructed her to increase to take 20 meq of Potassium daily per Dr. [name].

## 2021-11-19 NOTE — Telephone Encounter (Signed)
-----  Message from Larey Dresser, MD sent at 11/18/2021  5:47 PM EDT ----- Increase total daily KCl by 20 mEq

## 2021-11-30 DIAGNOSIS — F411 Generalized anxiety disorder: Secondary | ICD-10-CM | POA: Diagnosis not present

## 2021-11-30 DIAGNOSIS — E782 Mixed hyperlipidemia: Secondary | ICD-10-CM | POA: Diagnosis not present

## 2021-11-30 DIAGNOSIS — R5383 Other fatigue: Secondary | ICD-10-CM | POA: Diagnosis not present

## 2021-11-30 DIAGNOSIS — E039 Hypothyroidism, unspecified: Secondary | ICD-10-CM | POA: Diagnosis not present

## 2021-11-30 DIAGNOSIS — I10 Essential (primary) hypertension: Secondary | ICD-10-CM | POA: Diagnosis not present

## 2021-11-30 DIAGNOSIS — R42 Dizziness and giddiness: Secondary | ICD-10-CM | POA: Diagnosis not present

## 2021-11-30 DIAGNOSIS — D519 Vitamin B12 deficiency anemia, unspecified: Secondary | ICD-10-CM | POA: Diagnosis not present

## 2021-11-30 DIAGNOSIS — R410 Disorientation, unspecified: Secondary | ICD-10-CM | POA: Diagnosis not present

## 2021-11-30 DIAGNOSIS — F32A Depression, unspecified: Secondary | ICD-10-CM | POA: Diagnosis not present

## 2021-11-30 DIAGNOSIS — E559 Vitamin D deficiency, unspecified: Secondary | ICD-10-CM | POA: Diagnosis not present

## 2021-11-30 DIAGNOSIS — E1122 Type 2 diabetes mellitus with diabetic chronic kidney disease: Secondary | ICD-10-CM | POA: Diagnosis not present

## 2021-11-30 DIAGNOSIS — G4733 Obstructive sleep apnea (adult) (pediatric): Secondary | ICD-10-CM | POA: Diagnosis not present

## 2021-12-19 DIAGNOSIS — E1142 Type 2 diabetes mellitus with diabetic polyneuropathy: Secondary | ICD-10-CM | POA: Diagnosis not present

## 2022-01-03 DIAGNOSIS — I10 Essential (primary) hypertension: Secondary | ICD-10-CM | POA: Diagnosis not present

## 2022-01-03 DIAGNOSIS — E782 Mixed hyperlipidemia: Secondary | ICD-10-CM | POA: Diagnosis not present

## 2022-01-03 DIAGNOSIS — E039 Hypothyroidism, unspecified: Secondary | ICD-10-CM | POA: Diagnosis not present

## 2022-01-13 DIAGNOSIS — G4733 Obstructive sleep apnea (adult) (pediatric): Secondary | ICD-10-CM | POA: Diagnosis not present

## 2022-01-13 DIAGNOSIS — E782 Mixed hyperlipidemia: Secondary | ICD-10-CM | POA: Diagnosis not present

## 2022-01-13 DIAGNOSIS — I10 Essential (primary) hypertension: Secondary | ICD-10-CM | POA: Diagnosis not present

## 2022-01-13 DIAGNOSIS — E1122 Type 2 diabetes mellitus with diabetic chronic kidney disease: Secondary | ICD-10-CM | POA: Diagnosis not present

## 2022-01-13 DIAGNOSIS — R300 Dysuria: Secondary | ICD-10-CM | POA: Diagnosis not present

## 2022-01-13 DIAGNOSIS — I272 Pulmonary hypertension, unspecified: Secondary | ICD-10-CM | POA: Diagnosis not present

## 2022-01-13 DIAGNOSIS — E039 Hypothyroidism, unspecified: Secondary | ICD-10-CM | POA: Diagnosis not present

## 2022-01-19 DIAGNOSIS — E1142 Type 2 diabetes mellitus with diabetic polyneuropathy: Secondary | ICD-10-CM | POA: Diagnosis not present

## 2022-02-03 ENCOUNTER — Ambulatory Visit (INDEPENDENT_AMBULATORY_CARE_PROVIDER_SITE_OTHER): Payer: Medicare PPO

## 2022-02-03 ENCOUNTER — Other Ambulatory Visit: Payer: Self-pay

## 2022-02-03 ENCOUNTER — Ambulatory Visit
Admission: EM | Admit: 2022-02-03 | Discharge: 2022-02-03 | Disposition: A | Discharge status: Home / Self Care | Payer: Medicare PPO

## 2022-02-03 DIAGNOSIS — W19XXXA Unspecified fall, initial encounter: Secondary | ICD-10-CM

## 2022-02-03 DIAGNOSIS — M25552 Pain in left hip: Secondary | ICD-10-CM

## 2022-02-03 DIAGNOSIS — Z90710 Acquired absence of both cervix and uterus: Secondary | ICD-10-CM | POA: Diagnosis not present

## 2022-02-03 DIAGNOSIS — S300XXA Contusion of lower back and pelvis, initial encounter: Secondary | ICD-10-CM

## 2022-02-03 DIAGNOSIS — R102 Pelvic and perineal pain: Secondary | ICD-10-CM | POA: Diagnosis not present

## 2022-02-03 DIAGNOSIS — M533 Sacrococcygeal disorders, not elsewhere classified: Secondary | ICD-10-CM

## 2022-02-03 NOTE — ED Triage Notes (Signed)
Pt was cooking 2 days ago while in front of the stove and went to sit back on a stool and missed the stool. Fell onto her buttocks on the floor. Pain to mid-low back, coccyx, and in "private area" Was using salonpas patches and when she took them off the pain was worse. Normally walks with a rollator walker.

## 2022-02-03 NOTE — ED Notes (Signed)
Prior Auth was obtained for Ct pelvis without contrast.   Auth # [id number]

## 2022-02-03 NOTE — ED Provider Notes (Signed)
[facility]    CSN: [id number] Arrival date & time: 02/03/22  1222      History   Chief Complaint Chief Complaint  Patient presents with   Back Pain   Fall    HPI Jamie Murray is a 76 y.o. female.   HPI  76 year old female here for evaluation of back pain.  Patient reports that 2 days ago she was cooking and while standing from the stove she went to sit down on a stool and missed causing her to impact on the kitchen floor.  Since then she has had mid low back and coccyx pain.  She is also complaining of pain in between her legs at the level of her ischium when she walks.  She had been using Salonpas patches and those were giving her relief but when she took them off the pain was worse.  She is able to bear weight and ambulate but it does cause her discomfort.  She denies any numbness or tingling in her legs or loss of bowel or bladder control.  Past Medical History:  Diagnosis Date   Allergy    Anemia    Anxiety    COPD (chronic obstructive pulmonary disease) (HCC)    Depression    Diabetes ([place])    GERD (gastroesophageal reflux disease)    Hyperlipidemia    Hypertension    Insomnia    Migraines    Osteoporosis    [place] spotted fever    Transient cerebral ischemia    Vertigo    every 2-3 months    Patient Active Problem List   Diagnosis Date Noted   Anemia due to stage 3b chronic kidney disease ([place]) 03/15/2021   Rheumatoid factor positive 08/24/2020   Edema of lower extremity 02/03/2020   Monoclonal gammopathy 02/03/2020   Vitreous floaters of right eye    Shortness of breath    Pulmonary hypertension ([place])    Hyperglycemia due to diabetes mellitus ([place]) 01/09/2020   Elevated troponin level 01/09/2020   Bilateral hand pain 07/25/2018   Paresthesia of hand 07/25/2018   Severe pulmonary arterial systolic hypertension ([place]) 11/29/2016   Obstructive sleep apnea 09/29/2016   Mobility impaired 08/26/2016   Chest pain, rule out  acute myocardial infarction 08/09/2016   Chronic fatigue 05/11/2016   Anemia 05/11/2016   Hypomagnesemia 03/22/2016   Prolonged Q-T interval on ECG 03/22/2016   Syncope and collapse 03/17/2016   Macrocytosis 01/25/2016   Diarrhea, functional 01/25/2016   Acute kidney injury superimposed on CKD ([place]) 12/22/2015   Hypokalemia 12/22/2015   Heartburn    Hiatal hernia    Stricture and stenosis of esophagus    Gastroesophageal reflux disease without esophagitis 07/22/2015   Urinary tract infection 04/28/2015   Osteoporosis 01/26/2015   Insomnia 01/26/2015   Allergic rhinitis 01/26/2015   Anxiety and depression 01/26/2015   Hypothyroidism 01/26/2015   Hypertension 01/26/2015   Mixed anxiety and depressive disorder 01/26/2015   Type 2 diabetes mellitus with hyperlipidemia ([place]) 01/26/2015   Hyperlipidemia 01/26/2015   CVA (cerebral infarction) 01/26/2015   Chronic tension-type headache, intractable 10/06/2014   Disordered sleep 10/06/2014   Arthritis 06/28/2007    Past Surgical History:  Procedure Laterality Date   ABDOMINAL HYSTERECTOMY     CORONARY ANGIOPLASTY     ESOPHAGOGASTRODUODENOSCOPY (EGD) WITH PROPOFOL N/A 08/24/2015   Procedure: ESOPHAGOGASTRODUODENOSCOPY (EGD) WITH PROPOFOL with dialation;  Surgeon: Lucilla Lame, MD;  Location: [facility];  Service: Endoscopy;  Laterality: N/A;  Diabetic - oral  meds   RIGHT/LEFT HEART CATH AND CORONARY/GRAFT ANGIOGRAPHY N/A 01/13/2020   Procedure: RIGHT/LEFT HEART CATH AND CORONARY/GRAFT ANGIOGRAPHY;  Surgeon: Wellington Hampshire, MD;  Location: [facility];  Service: Cardiovascular;  Laterality: N/A;   TUBAL LIGATION      OB History   No obstetric history on file.      Home Medications    Prior to Admission medications   Medication Sig Start Date End Date Taking? Authorizing Provider  hydrALAZINE (APRESOLINE) 50 MG tablet Take by mouth. 12/26/16  Yes [provider]  ambrisentan (LETAIRIS) 10 MG tablet Take  1 tablet (10 mg total) by mouth daily. 03/08/21   Larey Dresser, MD  amLODipine (NORVASC) 10 MG tablet Take 1 tablet (10 mg total) by mouth daily. 04/14/21   Larey Dresser, MD  aspirin 81 MG tablet Take 81 mg by mouth daily.    [provider]  Cholecalciferol (VITAMIN D3) 125 MCG (5000 UT) CAPS Take 1 capsule by mouth daily.    [provider]  clonazePAM (KLONOPIN) 1 MG tablet Take 1 mg by mouth 2 (two) times daily as needed.    [provider]  clotrimazole-betamethasone (LOTRISONE) cream APPLY TO FEET TOPICALLY TWICE A DAY 04/05/21   Edrick Kins, DPM  dapagliflozin propanediol (FARXIGA) 10 MG TABS tablet Take 1 tablet (10 mg total) by mouth daily before breakfast. 04/14/21   Larey Dresser, MD  escitalopram (LEXAPRO) 10 MG tablet Take 10 mg by mouth daily.    [provider]  esomeprazole (NEXIUM) 40 MG capsule Take 40 mg by mouth daily at 12 noon.    [provider]  Fe Fum-FA-B Cmp-C-Zn-Mg-Mn-Cu (HEMOCYTE PLUS) 106-1 MG CAPS Take 1 capsule by mouth daily. Patient not taking: Reported on 11/05/2021 03/23/18   [provider]  furosemide (LASIX) 20 MG tablet Take 1 tablet (20 mg total) by mouth every other day. 11/09/21   Larey Dresser, MD  insulin aspart (NOVOLOG) 100 UNIT/ML FlexPen Inject 4 Units into the skin 3 (three) times daily with meals. Patient taking differently: Inject 4 Units into the skin 3 (three) times daily with meals. Only for BS greater than 200 01/13/20   Wieting, Richard, MD  insulin degludec (TRESIBA) 200 UNIT/ML FlexTouch Pen Inject 26 Units into the skin at bedtime.    [provider]  Insulin Pen Needle (B-D UF III MINI PEN NEEDLES) 31G X 5 MM MISC 4 (four) times daily 06/02/20   [provider]  levothyroxine (SYNTHROID) 75 MCG tablet Take 75 mcg by mouth daily before breakfast.    [provider]  loratadine (CLARITIN) 10 MG tablet Take 10 mg by mouth daily.     [provider]  meclizine (ANTIVERT) 25 MG tablet Take 25 mg by mouth 3 (three) times daily as needed for dizziness.     [provider]  metoprolol succinate (TOPROL-XL) 50 MG 24 hr tablet Take 50 mg by mouth daily. Take with or immediately following a meal.    [provider]  Multiple Vitamins-Minerals ([place]) CHEW Chew by mouth daily.    [provider]  nitroGLYCERIN (NITROSTAT) 0.4 MG SL tablet  11/15/19   [provider]  potassium chloride (KLOR-CON) 10 MEQ tablet Take 2 tablets (20 mEq total) by mouth daily. 11/19/21   Larey Dresser, MD  rosuvastatin (CRESTOR) 10 MG tablet Take 10 mg by mouth daily.    [provider]  tadalafil, PAH, (ADCIRCA) 20  MG tablet Take 2 tablets (40 mg total) by mouth daily. 08/03/21   Larey Dresser, MD  Treprostinil (TYVASO DPI TITRATION KIT) 16 & 32 & 48 MCG POWD Inhale into the lungs. Inhale 1 breath 4 times per day Patient not taking: Reported on 11/05/2021    [provider]    Family History Family History  Problem Relation Age of Onset   Diabetes Mother    Hypertension Mother    Stroke Mother    Heart disease Mother        MI   Cancer Father        pancreatic   Stroke Father    Hypertension Sister    Diabetes Brother    Diabetes Maternal Grandfather    Diabetes Sister    Diabetes Sister    Diabetes Brother    Diabetes Brother     Social History Social History   Tobacco Use   Smoking status: Never   Smokeless tobacco: Never  Vaping Use   Vaping Use: Never used  Substance Use Topics   Alcohol use: No    Alcohol/week: 0.0 standard drinks of alcohol   Drug use: No     Allergies   Atorvastatin, Fluoxetine, Levemir [insulin detemir], and Prozac [fluoxetine hcl]   Review of Systems Review of Systems  Genitourinary:        Denies incontinence of bowel or bladder.  Musculoskeletal:  Positive for back pain.  Skin:  Negative for color change.  Neurological:  Negative for  weakness and numbness.  Hematological: Negative.   Psychiatric/Behavioral: Negative.       Physical Exam Triage Vital Signs ED Triage Vitals [02/03/22 1250]  Enc Vitals Group     BP      Pulse      Resp      Temp      Temp src      SpO2      Weight 165 lb (74.8 kg)     Height _0  (1.702 m)     Head Circumference      Peak Flow      Pain Score 8     Pain Loc      Pain Edu?      Excl. in [place]?    No data found.  Updated Vital Signs BP 102/74 (BP Location: Left Arm)   Pulse (!) 59   Temp 97.9 F (36.6 C) (Oral)   Resp 16   Ht _1  (1.702 m)   Wt 165 lb (74.8 kg)   LMP  (LMP Unknown)   SpO2 100%   BMI 25.84 kg/m   Visual Acuity Right Eye Distance:   Left Eye Distance:   Bilateral Distance:    Right Eye Near:   Left Eye Near:    Bilateral Near:     Physical Exam Vitals and nursing note reviewed. Exam conducted with a chaperone present Allen Norris., RN).  Constitutional:      Appearance: Normal appearance. She is not ill-appearing.  Musculoskeletal:        General: Tenderness and signs of injury present. No swelling or deformity.  Skin:    General: Skin is warm and dry.     Capillary Refill: Capillary refill takes less than 2 seconds.     Findings: No bruising or erythema.  Neurological:     General: No focal deficit present.     Mental Status: She is alert and oriented to person, place, and time.  Psychiatric:  Mood and Affect: Mood normal.        Behavior: Behavior normal.        Thought Content: Thought content normal.        Judgment: Judgment normal.      UC Treatments / Results  Labs (all labs ordered are listed, but only abnormal results are displayed) Labs Reviewed - No data to display  EKG   Radiology CT PELVIS WO CONTRAST  Result Date: 02/03/2022 CLINICAL DATA:  Left hip pain after fall. EXAM: CT PELVIS WITHOUT CONTRAST TECHNIQUE: Multidetector CT imaging of the pelvis was performed following the standard protocol without  intravenous contrast. RADIATION DOSE REDUCTION: This exam was performed according to the departmental dose-optimization program which includes automated exposure control, adjustment of the mA and/or kV according to patient size and/or use of iterative reconstruction technique. COMPARISON:  Radiograph of same day.  CT scan of January 21, 2016. FINDINGS: Urinary Tract:  No abnormality visualized. Bowel:  Unremarkable visualized pelvic bowel loops. Vascular/Lymphatic: No pathologically enlarged lymph nodes. No significant vascular abnormality seen. Reproductive: Status post hysterectomy. No adnexal abnormality is noted. Other:  No ascites or hernia is noted. Musculoskeletal: No fracture or other significant bony abnormality is noted. IMPRESSION: No definite abnormality seen in the pelvis. Electronically Signed   By: Marijo Conception M.D.   On: 02/03/2022 14:39   DG Sacrum/Coccyx  Result Date: 02/03/2022 CLINICAL DATA:  Fall, pain to low back and coccyx EXAM: SACRUM AND COCCYX - 2+ VIEW; PELVIS - 1-2 VIEW COMPARISON:  None Available. FINDINGS: Mild osteopenia. No displaced fracture of the sacrum, coccyx, pelvis, or included bilateral proximal femurs. Nonobstructive pattern of overlying bowel gas. IMPRESSION: Mild osteopenia. No displaced fracture of the sacrum, coccyx, or included bilateral proximal femurs. Please note that plain radiographs are significantly insensitive for hip, pelvic, sacral fracture. Recommend CT to more sensitively evaluate if there is high clinical suspicion for fracture. Electronically Signed   By: Delanna Ahmadi M.D.   On: 02/03/2022 13:48   DG Pelvis 1-2 Views  Result Date: 02/03/2022 CLINICAL DATA:  Fall, pain to low back and coccyx EXAM: SACRUM AND COCCYX - 2+ VIEW; PELVIS - 1-2 VIEW COMPARISON:  None Available. FINDINGS: Mild osteopenia. No displaced fracture of the sacrum, coccyx, pelvis, or included bilateral proximal femurs. Nonobstructive pattern of overlying bowel gas. IMPRESSION:  Mild osteopenia. No displaced fracture of the sacrum, coccyx, or included bilateral proximal femurs. Please note that plain radiographs are significantly insensitive for hip, pelvic, sacral fracture. Recommend CT to more sensitively evaluate if there is high clinical suspicion for fracture. Electronically Signed   By: Delanna Ahmadi M.D.   On: 02/03/2022 13:48    Procedures Procedures (including critical care time)  Medications Ordered in UC Medications - No data to display  Initial Impression / Assessment and Plan / UC Course  I have reviewed the triage vital signs and the nursing notes.  Pertinent labs & imaging results that were available during my care of the patient were reviewed by me and considered in my medical decision making (see chart for details).   Patient is a nontoxic-appearing 76 year old female here for evaluation of sacral/coccygeal pain and midline low back pain that occurred as result of a ground-level fall onto her kitchen floor 2 days ago.  The patient had been using Salonpas patches at home which had been controlling her pain and she states that when she took them off the pain was worse.  She also indicates that she has pain between  her legs near her ischium when she walks.  With [place] at the bedside we assisted patient from the wheelchair to the stretcher for an exam.  Patient was able to bear weight and walk several steps to the exam table without significant difficulty.  I examined her posterior pelvis and there is no erythema, edema, or ecchymosis noted.  She does have midline tenderness over her sacrum and coccyx but no crepitus noted.  She has full sensation in her lower extremities and bilateral DP and PT pulses are 2+.  Her skin is warm and dry.  She has no pain with palpation of her iliac crests bilaterally.  I suspect the patient has bruised her sacrum and coccyx.  I will obtain radiographs of her sacrum and coccyx, as well as a mid radiograph of her pelvis to look for  possible ischial fractures.  Patient does have a history of CKD and type 2 diabetes.  She also has a history of osteoporosis all of which make her more prone to fracture.  I am unable to find the results of any DEXA scan in the recent past to evaluate her bone density.  Sacrum and coccyx x-rays independently reviewed and evaluated by me.  Impression: No evidence of fracture noted.  There are multiple phleboliths throughout the pelvis.  Radiology overread is pending.  Pelvic x-ray independently reviewed and evaluated by me.  Impression: There is no evidence of fracture of either ischium.  The pubic symphysis is well approximated.  Radiology overread is pending. Radiology findings report mild osteopenia with no displaced fracture to the sacrum, coccyx, pelvis, or bilateral proximal femurs noted.  They are recommending CT as plain films are significantly insensitive for the presence of fracture in the pelvic region.  I will order a CT of the pelvis to evaluate for possible fracture.  CT scan impression by radiology states no definite abnormality seen in the pelvis.  I will discharge patient home with a diagnosis of contusion of her coccyx and have her continue to use Salonpas and Tylenol as needed for pain.   Final Clinical Impressions(s) / UC Diagnoses   Final diagnoses:  Contusion of coccyx, initial encounter     Discharge Instructions      Your x-rays and CT did not show any evidence of broken bones in your pelvis.  I do believe that you have bruised your tailbone.  You may continue to use over-the-counter Salonpas as needed for pain relief.  You may also take over-the-counter Tylenol according to the package instructions as needed for pain relief.  If your symptoms continue I recommend you follow-up with your primary care provider.     ED Prescriptions   None    PDMP not reviewed this encounter.   Margarette Canada, NP 02/03/22 1443

## 2022-02-03 NOTE — Discharge Instructions (Addendum)
Your x-rays and CT did not show any evidence of broken bones in your pelvis.  I do believe that you have bruised your tailbone.  You may continue to use over-the-counter Salonpas as needed for pain relief.  You may also take over-the-counter Tylenol according to the package instructions as needed for pain relief.  If your symptoms continue I recommend you follow-up with your primary care provider.

## 2022-02-07 ENCOUNTER — Emergency Department
Admission: EM | Admit: 2022-02-07 | Discharge: 2022-02-07 | Disposition: A | Discharge status: Home / Self Care | Payer: Medicare PPO | Attending: Emergency Medicine | Admitting: Emergency Medicine

## 2022-02-07 ENCOUNTER — Emergency Department: Payer: Medicare PPO

## 2022-02-07 ENCOUNTER — Other Ambulatory Visit: Payer: Self-pay

## 2022-02-07 DIAGNOSIS — R197 Diarrhea, unspecified: Secondary | ICD-10-CM | POA: Diagnosis not present

## 2022-02-07 DIAGNOSIS — R0689 Other abnormalities of breathing: Secondary | ICD-10-CM | POA: Diagnosis not present

## 2022-02-07 DIAGNOSIS — I4510 Unspecified right bundle-branch block: Secondary | ICD-10-CM | POA: Diagnosis not present

## 2022-02-07 DIAGNOSIS — R109 Unspecified abdominal pain: Secondary | ICD-10-CM

## 2022-02-07 DIAGNOSIS — I491 Atrial premature depolarization: Secondary | ICD-10-CM | POA: Diagnosis not present

## 2022-02-07 DIAGNOSIS — Z20822 Contact with and (suspected) exposure to covid-19: Secondary | ICD-10-CM | POA: Diagnosis not present

## 2022-02-07 DIAGNOSIS — I959 Hypotension, unspecified: Secondary | ICD-10-CM | POA: Diagnosis not present

## 2022-02-07 DIAGNOSIS — R1032 Left lower quadrant pain: Secondary | ICD-10-CM | POA: Diagnosis not present

## 2022-02-07 DIAGNOSIS — I440 Atrioventricular block, first degree: Secondary | ICD-10-CM | POA: Diagnosis not present

## 2022-02-07 DIAGNOSIS — R1030 Lower abdominal pain, unspecified: Secondary | ICD-10-CM | POA: Diagnosis not present

## 2022-02-07 DIAGNOSIS — Z79899 Other long term (current) drug therapy: Secondary | ICD-10-CM | POA: Insufficient documentation

## 2022-02-07 DIAGNOSIS — I10 Essential (primary) hypertension: Secondary | ICD-10-CM | POA: Diagnosis not present

## 2022-02-07 DIAGNOSIS — I4581 Long QT syndrome: Secondary | ICD-10-CM

## 2022-02-07 DIAGNOSIS — J449 Chronic obstructive pulmonary disease, unspecified: Secondary | ICD-10-CM | POA: Insufficient documentation

## 2022-02-07 DIAGNOSIS — E119 Type 2 diabetes mellitus without complications: Secondary | ICD-10-CM | POA: Insufficient documentation

## 2022-02-07 DIAGNOSIS — R112 Nausea with vomiting, unspecified: Secondary | ICD-10-CM | POA: Diagnosis not present

## 2022-02-07 LAB — CBC WITH DIFFERENTIAL/PLATELET
Abs Immature Granulocytes: 0.02 10*3/uL (ref 0.00–0.07)
Basophils Absolute: 0 10*3/uL (ref 0.0–0.1)
Basophils Relative: 1 %
Eosinophils Absolute: 0 10*3/uL (ref 0.0–0.5)
Eosinophils Relative: 0 %
HCT: 36.8 % (ref 36.0–46.0)
Hemoglobin: 11.4 g/dL — ABNORMAL LOW (ref 12.0–15.0)
Immature Granulocytes: 1 %
Lymphocytes Relative: 32 %
Lymphs Abs: 1.4 10*3/uL (ref 0.7–4.0)
MCH: 28.5 pg (ref 26.0–34.0)
MCHC: 31 g/dL (ref 30.0–36.0)
MCV: 92 fL (ref 80.0–100.0)
Monocytes Absolute: 0.2 10*3/uL (ref 0.1–1.0)
Monocytes Relative: 4 %
Neutro Abs: 2.7 10*3/uL (ref 1.7–7.7)
Neutrophils Relative %: 62 %
Platelets: 224 10*3/uL (ref 150–400)
RBC: 4 MIL/uL (ref 3.87–5.11)
RDW: 19.8 % — ABNORMAL HIGH (ref 11.5–15.5)
WBC: 4.3 10*3/uL (ref 4.0–10.5)
nRBC: 0.7 % — ABNORMAL HIGH (ref 0.0–0.2)

## 2022-02-07 LAB — URINALYSIS, ROUTINE W REFLEX MICROSCOPIC
Bilirubin Urine: NEGATIVE
Glucose, UA: 50 mg/dL — AB
Hgb urine dipstick: NEGATIVE
Ketones, ur: NEGATIVE mg/dL
Leukocytes,Ua: NEGATIVE
Nitrite: NEGATIVE
Protein, ur: 100 mg/dL — AB
Specific Gravity, Urine: 1.031 — ABNORMAL HIGH (ref 1.005–1.030)
Squamous Epithelial / HPF: NONE SEEN (ref 0–5)
pH: 5 (ref 5.0–8.0)

## 2022-02-07 LAB — COMPREHENSIVE METABOLIC PANEL
ALT: 36 U/L (ref 0–44)
AST: 34 U/L (ref 15–41)
Albumin: 3.7 g/dL (ref 3.5–5.0)
Alkaline Phosphatase: 91 U/L (ref 38–126)
Anion gap: 12 (ref 5–15)
BUN: 33 mg/dL — ABNORMAL HIGH (ref 8–23)
CO2: 17 mmol/L — ABNORMAL LOW (ref 22–32)
Calcium: 9.3 mg/dL (ref 8.9–10.3)
Chloride: 108 mmol/L (ref 98–111)
Creatinine, Ser: 1.87 mg/dL — ABNORMAL HIGH (ref 0.44–1.00)
GFR, Estimated: 28 mL/min — ABNORMAL LOW (ref >60)
Glucose, Bld: 131 mg/dL — ABNORMAL HIGH (ref 70–99)
Potassium: 4 mmol/L (ref 3.5–5.1)
Sodium: 137 mmol/L (ref 135–145)
Total Bilirubin: 1.2 mg/dL (ref 0.3–1.2)
Total Protein: 7 g/dL (ref 6.5–8.1)

## 2022-02-07 LAB — PROTIME-INR
INR: 1.3 — ABNORMAL HIGH (ref 0.8–1.2)
Prothrombin Time: 15.8 seconds — ABNORMAL HIGH (ref 11.4–15.2)

## 2022-02-07 LAB — RESP PANEL BY RT-PCR (FLU A&B, COVID) ARPGX2
Influenza A by PCR: NEGATIVE
Influenza B by PCR: NEGATIVE
SARS Coronavirus 2 by RT PCR: NEGATIVE

## 2022-02-07 LAB — TROPONIN I (HIGH SENSITIVITY)
Troponin I (High Sensitivity): 33 ng/L — ABNORMAL HIGH (ref <18)
Troponin I (High Sensitivity): 32 ng/L — ABNORMAL HIGH (ref <18)

## 2022-02-07 LAB — MAGNESIUM: Magnesium: 2.3 mg/dL (ref 1.7–2.4)

## 2022-02-07 LAB — LIPASE, BLOOD: Lipase: 29 U/L (ref 11–51)

## 2022-02-07 LAB — ACETAMINOPHEN LEVEL: Acetaminophen (Tylenol), Serum: 14 ug/mL (ref 10–30)

## 2022-02-07 MED ORDER — OXYCODONE HCL 5 MG PO TABS: 2.5000 mg | ORAL_TABLET | Freq: Four times a day (QID) | ORAL | 0 refills | Status: AC | PRN

## 2022-02-07 MED ORDER — MORPHINE SULFATE (PF) 4 MG/ML IV SOLN
4.0000 mg | Freq: Once | INTRAVENOUS | Status: AC
Start: 2022-02-07 — End: 2022-02-07
  Administered 2022-02-07: 4 mg via INTRAVENOUS
  Filled 2022-02-07: qty 1

## 2022-02-07 MED ORDER — LIDOCAINE 5 % EX PTCH: 1.0000 | MEDICATED_PATCH | Freq: Two times a day (BID) | CUTANEOUS | 0 refills | Status: AC

## 2022-02-07 MED ORDER — LACTATED RINGERS IV BOLUS
1000.0000 mL | Freq: Once | INTRAVENOUS | Status: AC
Start: 2022-02-07 — End: 2022-02-07
  Administered 2022-02-07: 1000 mL via INTRAVENOUS

## 2022-02-07 MED ORDER — PROCHLORPERAZINE EDISYLATE 10 MG/2ML IJ SOLN
5.0000 mg | Freq: Once | INTRAMUSCULAR | Status: AC
  Administered 2022-02-07: 5 mg via INTRAVENOUS
  Filled 2022-02-07: qty 2

## 2022-02-07 MED ORDER — LIDOCAINE 5 % EX PTCH
1.0000 | MEDICATED_PATCH | Freq: Once | CUTANEOUS | Status: DC
  Administered 2022-02-07: 1 via TRANSDERMAL
  Filled 2022-02-07: qty 1

## 2022-02-07 NOTE — Discharge Instructions (Addendum)
NO tylenol for 48 hours. After 48 hours use max of 2g one day of tylenol or 512m every 6 hours.  Use lidocaine patches and 1/2 oxycodone for pain.  Use capful miralax if constipation. Call Gi to make follow up Call PCP for followup for prolong qtc on your EKG    No evidence of urolithiasis or hydronephrosis.    CT results:  Question hepatic cirrhosis. Mild splenomegaly and minimal ascites are nonspecific but can be seen with portal venous hypertension. Recommend correlation with liver function tests and hepatic serology.

## 2022-02-07 NOTE — ED Notes (Signed)
Pt provided with ice water and crackers per VO by provder. Pt informed of need for urine sample and states she is unable to urinate at this time. Provider notified. Call light is in reach, family at bedside. WCTM.

## 2022-02-07 NOTE — ED Provider Notes (Signed)
[facility] Provider Note    Event Date/Time   First MD Initiated Contact with Patient 02/07/22 1421     (approximate)   History   Chief Complaint Flank Pain, Nausea, and Diarrhea   HPI  Jamie Murray is a 76 y.o. female with past medical history of hypertension, hyperlipidemia, diabetes, and COPD who presents to the ED complaining of flank pain.  Patient reports that she fell about 1 week ago when she went to sit down on a stool and it slid out from under her.  She struck her left flank and has been dealing with increasing pain in this area for the past week.  She has developed nausea, vomiting, and diarrhea over the past 2 days, but denies any dysuria or hematuria.  She has not had any fevers, does state it has been difficult for her to keep down either liquids or solids.  She denies any cough, chest pain, or shortness of breath.  She denies hitting her head or losing consciousness with the fall.     Physical Exam   Triage Vital Signs: ED Triage Vitals [02/07/22 1424]  Enc Vitals Group     BP      Pulse      Resp      Temp      Temp src      SpO2      Weight 165 lb 5.5 oz (75 kg)     Height _0  (1.702 m)     Head Circumference      Peak Flow      Pain Score      Pain Loc      Pain Edu?      Excl. in [place]?     Most recent vital signs: Vitals:   02/07/22 1430  BP: 132/83  Pulse: 60  Resp: (!) 25  Temp: 98.2 F (36.8 C)  SpO2: 96%    Constitutional: Alert and oriented. Eyes: Conjunctivae are normal. Head: Atraumatic. Nose: No congestion/rhinnorhea. Mouth/Throat: Mucous membranes are moist.  Neck: No midline cervical spine tenderness to palpation. Cardiovascular: Normal rate, regular rhythm. Grossly normal heart sounds.  2+ radial pulses bilaterally. Respiratory: Normal respiratory effort.  No retractions. Lungs CTAB.  No chest wall tenderness to palpation. Gastrointestinal: Soft and tender to palpation in the left upper  quadrant as well as left costovertebral area. No distention. Musculoskeletal: No lower extremity tenderness nor edema.  No lumbar spinal tenderness to palpation noted. Neurologic:  Normal speech and language. No gross focal neurologic deficits are appreciated.    ED Results / Procedures / Treatments   Labs (all labs ordered are listed, but only abnormal results are displayed) Labs Reviewed  CBC WITH DIFFERENTIAL/PLATELET - Abnormal; Notable for the following components:      Result Value   Hemoglobin 11.4 (*)    RDW 19.8 (*)    nRBC 0.7 (*)    All other components within normal limits  COMPREHENSIVE METABOLIC PANEL - Abnormal; Notable for the following components:   CO2 17 (*)    Glucose, Bld 131 (*)    BUN 33 (*)    Creatinine, Ser 1.87 (*)    GFR, Estimated 28 (*)    All other components within normal limits  LIPASE, BLOOD  MAGNESIUM  URINALYSIS, ROUTINE W REFLEX MICROSCOPIC     EKG  ED ECG REPORT I, Blake Divine, the attending physician, personally viewed and interpreted this ECG.   Date: 02/07/2022  EKG Time: 14:25  Rate: 75  Rhythm: normal sinus rhythm  Axis: Normal  Intervals: Prolonged QT  ST&T Change: None  PROCEDURES:  Critical Care performed: No  Procedures   MEDICATIONS ORDERED IN ED: Medications  morphine (PF) 4 MG/ML injection 4 mg (4 mg Intravenous Given 02/07/22 1439)  lactated ringers bolus 1,000 mL (1,000 mLs Intravenous New Bag/Given 02/07/22 1441)  prochlorperazine (COMPAZINE) injection 5 mg (5 mg Intravenous Given 02/07/22 1459)     IMPRESSION / MDM / ASSESSMENT AND PLAN / ED COURSE  I reviewed the triage vital signs and the nursing notes.                              76 y.o. female with past medical history of hypertension, hyperlipidemia, diabetes, and COPD who presents to the ED with 1 week of worsening left flank pain after a fall, now with nausea, vomiting, and diarrhea.  Patient's presentation is most consistent with acute  presentation with potential threat to life or bodily function.  Differential diagnosis includes, but is not limited to, rib fracture, abdominal contusion, splenic injury, pyelonephritis, kidney stone, diverticulitis, gastroenteritis.  Patient well-appearing and in no acute distress, vital signs are unremarkable.  She does have pain with palpation to her left upper quadrant and left flank area, may be secondary to contusion versus gastroenteritis with her vomiting and diarrhea, however will further assess with CT scan for traumatic injury or other acute process.  Labs thus far are reassuring with similar chronic kidney disease to her baseline, no acute electrolyte abnormality, LFTs and lipase within normal limits.  CBC without significant anemia or leukocytosis, we will treat symptomatically with IV morphine and Compazine, hydrate with IV fluids and reassess following CT imaging.  Patient turned over to oncoming provider pending CT results and reassessment.      FINAL CLINICAL IMPRESSION(S) / ED DIAGNOSES   Final diagnoses:  Flank pain     Rx / DC Orders   ED Discharge Orders     None        Note:  This document was prepared using Dragon voice recognition software and may include unintentional dictation errors.   Blake Divine, MD 02/07/22 [phone]

## 2022-02-07 NOTE — ED Triage Notes (Signed)
Pt arrives via EMS from home w/ c/o L flank pain x1 week and n/v/d x2 days. EMS reports pain radiates into lower abd and is worse when ambulating. Pt unable to keep meds down today.   159 bg 97.1  56 hr 96% ra 27 rr 113/70

## 2022-02-07 NOTE — ED Provider Notes (Addendum)
4:28 PM Assumed care for off going team.   Blood pressure 124/83, pulse 60, temperature 98.2 F (36.8 C), temperature source Oral, resp. rate (!) 21, height _0  (1.702 m), weight 75 kg, SpO2 96 %.  I reviewed the CT personally interpreted and no evidence of kidney stone pending final report-  See their HPI for full report but in brief pending CT scan--there was question of possible hepatic cirrhosis due to some [place] nodularity with some minimal ascites noted.  Patient's liver test are normal.  On 8/31 she has no ascites noted on CT   I talked to radiology Dr. Joelyn Oms who does not see evidence of bowel injury.  She states that the ascites are very minimal and trace in nature.  On abdominal exam she is soft and nontender.  She reports only pain when she sits up which seems to be more musculoskeletal in nature.  Patient does report taking Tylenol every 4 hours.  However she does not think she is taking more than 8 which would still be in the recommended area but given this concern for possible ascites I will get Tylenol level however given her LFTs are normal I suspect this is unlikely a significant Tylenol overdose.  I discussed the case with poison control and patient does not meet criteria for NAC but they do recommend no Tylenol for the next 48 hours.  Discussed with patient limitations and pain medications given she cannot take ibuprofen due to CKD and now no Tylenol.  We will do a short course of oxycodone to half a pill in the understand the risk for falls but they are okay with proceeding.  We will also do lidocaine patches.   I considered admission discussed admission with patient but she would really prefer to go home and she is agreeable to the above plan.      Vanessa San [place], MD 02/07/22 2222    Vanessa Altamont, MD 02/07/22 2224

## 2022-02-07 NOTE — ED Notes (Signed)
Pt provided with additional water per request, still unable to urinate at this time. WCTM.

## 2022-02-08 LAB — HEPATITIS PANEL, ACUTE
HCV Ab: NONREACTIVE
Hep A IgM: NONREACTIVE
Hep B C IgM: NONREACTIVE
Hepatitis B Surface Ag: NONREACTIVE

## 2022-02-11 ENCOUNTER — Other Ambulatory Visit (HOSPITAL_COMMUNITY): Payer: Self-pay | Admitting: *Deleted

## 2022-02-11 ENCOUNTER — Other Ambulatory Visit (HOSPITAL_COMMUNITY): Payer: Self-pay | Admitting: Pharmacist

## 2022-02-11 MED ORDER — DAPAGLIFLOZIN PROPANEDIOL 10 MG PO TABS: 10.0000 mg | ORAL_TABLET | Freq: Every day | ORAL | 3 refills | Status: DC

## 2022-02-19 DIAGNOSIS — E1142 Type 2 diabetes mellitus with diabetic polyneuropathy: Secondary | ICD-10-CM | POA: Diagnosis not present

## 2022-02-22 ENCOUNTER — Telehealth (HOSPITAL_COMMUNITY): Payer: Self-pay | Admitting: Pharmacy Technician

## 2022-02-22 ENCOUNTER — Ambulatory Visit (HOSPITAL_COMMUNITY)
Admission: RE | Admit: 2022-02-22 | Discharge: 2022-02-22 | Disposition: A | Discharge status: Home / Self Care | Payer: Medicare PPO | Source: Ambulatory Visit | Attending: Family | Admitting: Family

## 2022-02-22 ENCOUNTER — Ambulatory Visit (HOSPITAL_BASED_OUTPATIENT_CLINIC_OR_DEPARTMENT_OTHER)
Admission: RE | Admit: 2022-02-22 | Discharge: 2022-02-22 | Disposition: A | Discharge status: Home / Self Care | Payer: Medicare PPO | Source: Ambulatory Visit | Attending: Cardiology | Admitting: Cardiology

## 2022-02-22 ENCOUNTER — Encounter (HOSPITAL_COMMUNITY): Payer: Self-pay | Admitting: Cardiology

## 2022-02-22 VITALS — BP 110/68 | HR 55 | Wt 167.8 lb

## 2022-02-22 DIAGNOSIS — I130 Hypertensive heart and chronic kidney disease with heart failure and stage 1 through stage 4 chronic kidney disease, or unspecified chronic kidney disease: Secondary | ICD-10-CM | POA: Diagnosis not present

## 2022-02-22 DIAGNOSIS — R197 Diarrhea, unspecified: Secondary | ICD-10-CM | POA: Insufficient documentation

## 2022-02-22 DIAGNOSIS — R7982 Elevated C-reactive protein (CRP): Secondary | ICD-10-CM | POA: Diagnosis not present

## 2022-02-22 DIAGNOSIS — F32A Depression, unspecified: Secondary | ICD-10-CM | POA: Insufficient documentation

## 2022-02-22 DIAGNOSIS — R700 Elevated erythrocyte sedimentation rate: Secondary | ICD-10-CM | POA: Diagnosis not present

## 2022-02-22 DIAGNOSIS — I272 Pulmonary hypertension, unspecified: Secondary | ICD-10-CM

## 2022-02-22 DIAGNOSIS — G4733 Obstructive sleep apnea (adult) (pediatric): Secondary | ICD-10-CM | POA: Insufficient documentation

## 2022-02-22 DIAGNOSIS — J984 Other disorders of lung: Secondary | ICD-10-CM | POA: Diagnosis not present

## 2022-02-22 DIAGNOSIS — R519 Headache, unspecified: Secondary | ICD-10-CM | POA: Diagnosis not present

## 2022-02-22 DIAGNOSIS — K7460 Unspecified cirrhosis of liver: Secondary | ICD-10-CM | POA: Insufficient documentation

## 2022-02-22 DIAGNOSIS — I2721 Secondary pulmonary arterial hypertension: Secondary | ICD-10-CM | POA: Diagnosis not present

## 2022-02-22 DIAGNOSIS — I50810 Right heart failure, unspecified: Secondary | ICD-10-CM

## 2022-02-22 DIAGNOSIS — E1122 Type 2 diabetes mellitus with diabetic chronic kidney disease: Secondary | ICD-10-CM | POA: Diagnosis not present

## 2022-02-22 DIAGNOSIS — Z7984 Long term (current) use of oral hypoglycemic drugs: Secondary | ICD-10-CM | POA: Diagnosis not present

## 2022-02-22 DIAGNOSIS — I509 Heart failure, unspecified: Secondary | ICD-10-CM | POA: Insufficient documentation

## 2022-02-22 DIAGNOSIS — Z79899 Other long term (current) drug therapy: Secondary | ICD-10-CM | POA: Insufficient documentation

## 2022-02-22 DIAGNOSIS — F419 Anxiety disorder, unspecified: Secondary | ICD-10-CM | POA: Diagnosis not present

## 2022-02-22 LAB — ECHOCARDIOGRAM COMPLETE
Area-P 1/2: 2.08 cm2
S' Lateral: 2 cm

## 2022-02-22 MED ORDER — PERFLUTREN LIPID MICROSPHERE
1.0000 mL | INTRAVENOUS | Status: DC | PRN
  Administered 2022-02-22: 2 mL via INTRAVENOUS

## 2022-02-22 MED ORDER — FUROSEMIDE 20 MG PO TABS: 40.0000 mg | ORAL_TABLET | Freq: Every day | ORAL | 3 refills | Status: DC

## 2022-02-22 NOTE — Telephone Encounter (Signed)
Advanced Heart Failure Patient Advocate Encounter  Prior Authorization for Orenitram Month 1 (0.125 and 0.36m ER) has been submitted and approved.    PA# [id number] Effective dates: 02/22/22 through 06/05/22  Sent off referral and 90 day trial form along with PA approval information to [facility]. Document scanned to chart.

## 2022-02-22 NOTE — Progress Notes (Signed)
  Echocardiogram 2D Echocardiogram has been performed.  Jamie Murray 02/22/2022, 12:59 PM

## 2022-02-22 NOTE — Progress Notes (Addendum)
PCP: Jamie Claude, FNP HF Cardiology: Dr. Aundra Murray  76 y.o. with history of HTN, type 2 diabetes, OSA on CPAP, and pulmonary hypertension/RV failure was referred by Dr. Fletcher Murray for evaluation of pulmonary hypertension. It appears that Jamie Murray was diagnosed as early as 2018 by echo.  Echo in 8101 showed PA systolic pressure 85 mmHg.  She says that she was told that the pulmonary hypertension was due to OSA.  She has been compliant with CPAP. She reports exertional dyspnea for about 4 years now.   In 8/21, she was admitted to [facility] with CHF.  Echo showed EF 55-60%, mild LVH, D-shaped septum, severe RV dilation with moderate RV systolic dysfunction, PASP 83, moderate TR, moderate PI, dilated IVC.  RHC/LHC showed no coronary disease, severe pulmonary hypertension with PVR 7.38 and preserved cardiac output.  High resolution CT chest in 9/21 showed no evidence for fibrotic ILD.    She saw rheumatology and was given no definite rheumatological diagnosis.    Echo in 7/22 showed EF 65-70% with moderate LVH, RV moderately dilated with moderately decreased RV systolic function, mild RVH, D-shaped septum, PASP 85 mmHg, moderate PR, normal IVC.  Echo was done today and reviewed, EF 55-60%, D-shaped septum, severe RV dilation with severe RV dysfunction, mild TR, PASP 50 mmHg.   She returns for followup of pulmonary hypertension.  She is off Uptravi, unable to tolerate even the lowest dose due to diarrhea and headache.  Breathing has been worse off Uptravi.  She tried Tyvaso DPI; did not have significant side effects but did not like inhaling and found the device hard to manipulate so she stopped it.  She is not interested in trying the inhaler form of Tyvaso, says she wants to try a pill again.  Patient reports stopping Lasix for about a week after she tripped and fell recently, she was in pain and spent several days in bed.  She is short of breath walking around her house.  She uses a walker. She has been under a lot of  stress, husband has been chronically ill. No chest pain, no lightheadedness/syncope. Weight is stable. Of note, she was out of tadalafil for 1 month and felt even worse than she does now but has restarted this med.   Labs (8/21): K 3.3, creatinine 1.39, ANA negative, anti-Sm negative, anti-SCL-70 negative, ANCA negative, RF elevated 52 but CCP negative, ESR 67, CRP 2.2 Labs (9/21): K 4.5, creatinine 1.8 => 1.45, anti-centromere Ab negative, BNP 309 Labs (11/21): K 4.1, creatinine 1.47, BNP 590 Labs (1/22): LDL 55, K 3.8, creatinine 1.49 Labs (5/22): LDL 74, K 4, creatinine 1.5 Labs (9/22): K 3.8, creatinine 1.43 Labs (12/22): LDL 54, K 3.8, creatinine 1.3 Labs (2/23): BNP 197, K 3.4, creatinine 1.3 Labs (6/23): BNP 862 Labs (9/23): K 4, creatinine 1.87  6 minute walk (8/21): 154 m 6 minute walk (11/21): 183 m 6 minute walk (3/22): 351 m 6 minute walk (11/22): 381 m 6 minute walk (2/23): 290 m (off Uptravi)  PMH: 1. HTN 2. Type 2 diabetes 3. OSA: Uses CPAP.  4. GERD 5. Hypothyroidism 6. Pulmonary hypertension/RV failure: Pulmonary hypertension diagnosed 2018 by echo.  - PFTs (6/18): moderate restriction - Echo 2020 with PASP 85 mmHg.  - Echo (8/21): EF 55-60%, mild LVH, D-shaped septum, severe RV dilation with moderate RV systolic dysfunction, PASP 83, moderate TR, moderate PI, dilated IVC.  - LHC/RHC (8/21): Normal coronaries; mean RA 3, PA 82/21 mean 44, mean PCWP 14, CI 2.2, PVR  7.35 WU.  - V/Q scan (8/21): No evidence for chronic PE.  - Serologic workup: ANA negative, anti-Sm negative, anti-SCL-70 negative, ANCA negative, RF elevated 52 but CCP negative, ESR 67, CRP 2.2, anti-centromere negative - High resolution CT chest: No fibrotic ILD.  - Echo (7/22): EF 65-70% with moderate LVH, RV moderately dilated with moderately decreased RV systolic function, mild RVH, D-shaped septum, PASP 85 mmHg, moderate PR, normal IVC.  - Echo (9/23): EF 55-60%, D-shaped septum, severe RV dilation  with severe RV dysfunction, mild TR, PASP 50 mmHg. 7. CKD stage 3.  8. Cirrhosis: Noted on 9/23 CT abdomen/pelvis. Suspect cardiogenic.   Social History   Socioeconomic History   Marital status: Married    Spouse name: Jamie Murray   Number of children: 3   Years of education: 14   Highest education level: Not on file  Occupational History    Comment: work part time, day care  Tobacco Use   Smoking status: Never   Smokeless tobacco: Never  Vaping Use   Vaping Use: Never used  Substance and Sexual Activity   Alcohol use: No    Alcohol/week: 0.0 standard drinks of alcohol   Drug use: No   Sexual activity: Never  Other Topics Concern   Not on file  Social History Narrative   Lives with husband   Caffeine use- coffee 2 cups daily, Coke once a week, green tea daily   Social Determinants of Health   Financial Resource Strain: Not on file  Food Insecurity: Not on file  Transportation Needs: Not on file  Physical Activity: Not on file  Stress: Not on file  Social Connections: Not on file  Intimate Partner Violence: Not on file   Family History  Problem Relation Age of Onset   Diabetes Mother    Hypertension Mother    Stroke Mother    Heart disease Mother        MI   Cancer Father        pancreatic   Stroke Father    Hypertension Sister    Diabetes Brother    Diabetes Maternal Grandfather    Diabetes Sister    Diabetes Sister    Diabetes Brother    Diabetes Brother    ROS: All systems reviewed and negative except as per HPI.   Current Outpatient Medications  Medication Sig Dispense Refill   ambrisentan (LETAIRIS) 10 MG tablet Take 1 tablet (10 mg total) by mouth daily. 30 tablet 3   amLODipine (NORVASC) 10 MG tablet Take 1 tablet (10 mg total) by mouth daily. 90 tablet 3   aspirin 81 MG tablet Take 81 mg by mouth daily.     Cholecalciferol (VITAMIN D3) 125 MCG (5000 UT) CAPS Take 1 capsule by mouth daily.     clonazePAM (KLONOPIN) 1 MG tablet Take 1 mg by mouth 2  (two) times daily as needed.     clotrimazole-betamethasone (LOTRISONE) cream APPLY TO FEET TOPICALLY TWICE A DAY 45 g 1   dapagliflozin propanediol (FARXIGA) 10 MG TABS tablet Take 1 tablet (10 mg total) by mouth daily before breakfast. 90 tablet 3   escitalopram (LEXAPRO) 10 MG tablet Take 10 mg by mouth daily.     esomeprazole (NEXIUM) 40 MG capsule Take 40 mg by mouth daily at 12 noon.     Fe Fum-FA-B Cmp-C-Zn-Mg-Mn-Cu (HEMOCYTE PLUS) 106-1 MG CAPS Take 1 capsule by mouth daily.  6   hydrALAZINE (APRESOLINE) 50 MG tablet Take 100 mg by mouth 2 (two)  times daily.     insulin aspart (NOVOLOG) 100 UNIT/ML injection Inject 4 Units into the skin. Bs greater than 200     insulin degludec (TRESIBA) 200 UNIT/ML FlexTouch Pen Inject 26 Units into the skin at bedtime.     Insulin Pen Needle (B-D UF III MINI PEN NEEDLES) 31G X 5 MM MISC 4 (four) times daily     levothyroxine (SYNTHROID) 75 MCG tablet Take 75 mcg by mouth daily before breakfast.     loratadine (CLARITIN) 10 MG tablet Take 10 mg by mouth daily.      meclizine (ANTIVERT) 25 MG tablet Take 25 mg by mouth 3 (three) times daily as needed for dizziness.      metoprolol succinate (TOPROL-XL) 50 MG 24 hr tablet Take 50 mg by mouth daily. Take with or immediately following a meal.     Multiple Vitamins-Minerals (MULTI ADULT GUMMIES) CHEW Chew by mouth daily.     nitroGLYCERIN (NITROSTAT) 0.4 MG SL tablet      potassium chloride (KLOR-CON) 10 MEQ tablet Take 2 tablets (20 mEq total) by mouth daily. 30 tablet 11   rosuvastatin (CRESTOR) 10 MG tablet Take 10 mg by mouth daily.     tadalafil, PAH, (ADCIRCA) 20 MG tablet Take 2 tablets (40 mg total) by mouth daily. 60 tablet 11   furosemide (LASIX) 20 MG tablet Take 2 tablets (40 mg total) by mouth daily. 90 tablet 3   No current facility-administered medications for this encounter.   BP 110/68   Pulse (!) 55   Wt 76.1 kg (167 lb 12.8 oz)   LMP  (LMP Unknown)   SpO2 100%   BMI 26.28 kg/m   General: NAD Neck: JVP 8-9 cm, no thyromegaly or thyroid nodule.  Lungs: Clear to auscultation bilaterally with normal respiratory effort. CV: Nondisplaced PMI.  Heart regular S1/S2, no S3/S4, no murmur. 1+ ankle edema.  No carotid bruit.  Normal pedal pulses.  Abdomen: Soft, nontender, no hepatosplenomegaly, no distention.  Skin: Intact without lesions or rashes.  Neurologic: Alert and oriented x 3.  Psych: Normal affect. Extremities: No clubbing or cyanosis.  HEENT: Normal.   Assessment/Plan: 1. HTN: BP controlled.  - Continue amlodipine.  2. OSA: Continue CPAP.    3. Pulmonary HTN/RV failure: Echo in 8/21 with EF 55-60%, mild LVH, D-shaped septum, severe RV dilation with moderate RV systolic dysfunction, PASP 83, moderate TR, moderate PI, dilated IVC.  Suspect long-standing RV dysfunction based on appearance of echo. [place] in 8/21 with severe pulmonary arterial hypertension, preserved cardiac output. PFTs from 6/18 showed moderate restriction.  V/Q scan from 8/21 showed no evidence for chronic PE. She has treated OSA.  Serologic workup was negative except for elevated CRP and ESR and elevated RF (normal CCP).  High resolution CT chest did not show evidence for ILD. I suspect long-standing group 1 PAH. Repeat echo in 7/22 showed EF 65-70% with moderate LVH, RV moderately dilated with moderately decreased RV systolic function, mild RVH, D-shaped septum, PASP 85 mmHg, moderate PR, normal IVC. Though echo still showed significant RV dysfunction, she symptomatically has felt better on PH meds.  She had to stop Uptravi due to intractable diarrhea and headache.  Breathing has worsened considerably off Uptravi. She was unable to use Tyvaso.  Echo today showed EF 55-60%, D-shaped septum, severe RV dilation with severe RV dysfunction, mild TR, PASP 50 mmHg.  On exam, she appears mildly volume overloaded.  NYHA class IIIb symptoms.  - Defer 6 minute walk today,  she does not want to try.  - Increase Lasix to  40 mg daily, BMET/BNP today and again in 10 days.   - Continue Farxiga 10 mg daily.  - Continue ambrisentan 10.  - Continue tadalafil 40 mg daily (felt worse when off x 1 month).    - Unable to tolerate Uptravi but when she was on it, she had much better 6 minute walk and improved breathing. She could not handle Tyvaso DPI and does not want to try to the Tyvaso inhaler, wants only pills.  I will have her try orenitram.  We discussed possible side effects and how these tend to decrease with time.  She will start this as soon as possible and titrate up slowly.  - Continue CPAP (OSA likely contributes but certainly cannot explain the extent of her [place]).  - I will see how she does on orenitram.  Will discuss repeat RHC at next appointment.  If side effects limit orenitram also, I am not sure that parenteral selective pulmonary vasodilators would be successful (and she is not sure she would want an infusion pump).  - Refer back to pulmonary rehab at [facility] 4. Pulmonary insufficiency: Moderate on today's echo.   5. Depression/Anxiety: Very anxious/depressed due to her husband's condition. I referred to psychiatry.   6. Cirrhosis: Noted on imaging.  Likely due  to RV failure and passive congestion.   Followup in 2 months, consider RHC.  Loralie Champagne 02/22/2022

## 2022-02-22 NOTE — Patient Instructions (Signed)
Increase lasix to 40 mg daily.  Lauren the pharmacist is working on your new medication.  Repeat blood work in 10 days  You have been referred to Pulmonary rehab at [facility]. They will call you to arrange the appointment.  Your physician recommends that you schedule a follow-up appointment in: 2 months.  If you have any questions or concerns before your next appointment please send [place] a message through [place] or call our office at [phone].    TO LEAVE A MESSAGE FOR THE NURSE SELECT OPTION 2, PLEASE LEAVE A MESSAGE INCLUDING: YOUR NAME DATE OF BIRTH CALL BACK NUMBER REASON FOR CALL**this is important as we prioritize the call backs  YOU WILL RECEIVE A CALL BACK THE SAME DAY AS LONG AS YOU CALL BEFORE 4:00 PM  At the [facility], you and your health needs are our priority. As part of our continuing mission to provide you with exceptional heart care, we have created designated Provider Care Teams. These Care Teams include your primary Cardiologist (physician) and Advanced Practice Providers (APPs- Physician Assistants and Nurse Practitioners) who all work together to provide you with the care you need, when you need it.   You may see any of the following providers on your designated Care Team at your next follow up: Dr Glori Bickers Dr Loralie Champagne Dr. Roxana Hires, NP Lyda Jester, [place] [facility] [place], [place] Forestine Na, NP [name], PharmD   Please be sure to bring in all your medications bottles to every appointment.

## 2022-03-01 NOTE — Telephone Encounter (Signed)
Advanced Heart Failure Patient Advocate Encounter  Called [facility] to check the status of the patient's application. Representative stated that the prescription was traiged to [facility] today. A representative will be reaching out to the patient to schedule the 90 day voucher shipment. Inquired into benefits verification with patient's insurance since PA approval has been provided. This has not been started, will get update once information is available.   Called and spoke with the patient's daughter. She also requested help with tadalafil. The [place] she was using is now expired. Emailed the patient's daughter a copy of the TAF grant that is still active. Of note, this grant will also cover Orenitram.

## 2022-03-07 ENCOUNTER — Other Ambulatory Visit: Payer: Self-pay | Admitting: *Deleted

## 2022-03-07 DIAGNOSIS — I272 Pulmonary hypertension, unspecified: Secondary | ICD-10-CM

## 2022-03-07 NOTE — Telephone Encounter (Signed)
Advanced Heart Failure Patient Advocate Encounter  Received notification from [facility] that [employer] was pending the [place] call to be able to send the Orenitram to her. Called and spoke with the patient. Her husband recently passed away. She has had a hard time doing things recently given this. Provided the phone number to [facility], she will call when able.

## 2022-03-18 NOTE — Telephone Encounter (Signed)
error 

## 2022-03-24 NOTE — Telephone Encounter (Addendum)
Advanced Heart Failure Patient Advocate Encounter  Received notification from [facility] that the patient's first supply of Orenitram was shipped on 03/22/22 from [place].   Patient's ID for [facility] [employer] is [id number]  Charlann Boxer, CPhT

## 2022-04-04 DIAGNOSIS — I152 Hypertension secondary to endocrine disorders: Secondary | ICD-10-CM | POA: Diagnosis not present

## 2022-04-04 DIAGNOSIS — E1142 Type 2 diabetes mellitus with diabetic polyneuropathy: Secondary | ICD-10-CM | POA: Diagnosis not present

## 2022-04-04 DIAGNOSIS — Z794 Long term (current) use of insulin: Secondary | ICD-10-CM | POA: Diagnosis not present

## 2022-04-04 DIAGNOSIS — E063 Autoimmune thyroiditis: Secondary | ICD-10-CM | POA: Diagnosis not present

## 2022-04-04 DIAGNOSIS — E1159 Type 2 diabetes mellitus with other circulatory complications: Secondary | ICD-10-CM | POA: Diagnosis not present

## 2022-04-04 DIAGNOSIS — E782 Mixed hyperlipidemia: Secondary | ICD-10-CM | POA: Diagnosis not present

## 2022-04-06 NOTE — Telephone Encounter (Signed)
Advanced Heart Failure Patient Advocate Encounter  Received update from [place]. Per [employer], the patient has expressed that she does not want to complete training's or start on orenitram. Will route to provider as well.   Charlann Boxer, CPhT

## 2022-04-06 NOTE — Telephone Encounter (Signed)
Can someone give her a call and see why she does not want to start orenitram for her pulmonary hypertension?

## 2022-04-12 NOTE — Telephone Encounter (Signed)
Spoke with patient's daughter and she was not aware the patient had refused to start the orenitram. Patient's husband recently passed and she has been overwhelmed. She is going to speak with her mom and try and determine when she would be willing to start the medication and will call the clinic back.

## 2022-04-14 DIAGNOSIS — E1122 Type 2 diabetes mellitus with diabetic chronic kidney disease: Secondary | ICD-10-CM | POA: Diagnosis not present

## 2022-04-14 DIAGNOSIS — I272 Pulmonary hypertension, unspecified: Secondary | ICD-10-CM | POA: Diagnosis not present

## 2022-04-14 DIAGNOSIS — F411 Generalized anxiety disorder: Secondary | ICD-10-CM | POA: Diagnosis not present

## 2022-04-14 DIAGNOSIS — F32A Depression, unspecified: Secondary | ICD-10-CM | POA: Diagnosis not present

## 2022-04-14 DIAGNOSIS — R002 Palpitations: Secondary | ICD-10-CM | POA: Diagnosis not present

## 2022-04-14 DIAGNOSIS — I10 Essential (primary) hypertension: Secondary | ICD-10-CM | POA: Diagnosis not present

## 2022-04-14 DIAGNOSIS — R300 Dysuria: Secondary | ICD-10-CM | POA: Diagnosis not present

## 2022-04-14 DIAGNOSIS — G4733 Obstructive sleep apnea (adult) (pediatric): Secondary | ICD-10-CM | POA: Diagnosis not present

## 2022-04-14 DIAGNOSIS — E782 Mixed hyperlipidemia: Secondary | ICD-10-CM | POA: Diagnosis not present

## 2022-04-14 LAB — MICROALBUMIN / CREATININE URINE RATIO

## 2022-04-14 LAB — PROTEIN / CREATININE RATIO, URINE
Albumin, U: 30
Creatinine, Urine: 50

## 2022-05-02 ENCOUNTER — Other Ambulatory Visit (HOSPITAL_COMMUNITY): Payer: Self-pay

## 2022-05-02 MED ORDER — AMLODIPINE BESYLATE 10 MG PO TABS: 10.0000 mg | ORAL_TABLET | Freq: Every day | ORAL | 3 refills | Status: DC

## 2022-05-03 NOTE — Telephone Encounter (Signed)
Advanced Heart Failure Patient Advocate Encounter  Received another notification from [facility] that the referral has been placed on hold. Patient does not wish to start [place], CPhT

## 2022-05-05 DIAGNOSIS — E782 Mixed hyperlipidemia: Secondary | ICD-10-CM | POA: Diagnosis not present

## 2022-05-05 DIAGNOSIS — E039 Hypothyroidism, unspecified: Secondary | ICD-10-CM | POA: Diagnosis not present

## 2022-05-05 DIAGNOSIS — I10 Essential (primary) hypertension: Secondary | ICD-10-CM | POA: Diagnosis not present

## 2022-05-16 ENCOUNTER — Telehealth (HOSPITAL_COMMUNITY): Payer: Self-pay | Admitting: Pharmacy Technician

## 2022-05-16 NOTE — Telephone Encounter (Signed)
Advanced Heart Failure Patient Advocate Encounter  Patient left vm asking for me to return her call. Called and left the patient a message.   Charlann Boxer, CPhT

## 2022-05-19 ENCOUNTER — Telehealth (HOSPITAL_COMMUNITY): Payer: Self-pay | Admitting: Pharmacist

## 2022-05-19 ENCOUNTER — Telehealth (HOSPITAL_COMMUNITY): Payer: Self-pay | Admitting: Pharmacy Technician

## 2022-05-19 NOTE — Telephone Encounter (Signed)
Advanced Heart Failure Patient Advocate Encounter  Patient left vm asking for me to return her call. Called and left the patient a message.   Charlann Boxer, CPhT

## 2022-05-19 NOTE — Telephone Encounter (Signed)
Advanced Heart Failure Patient Advocate Encounter  Prior Authorizations for ambrisentan and tadalafil have been approved.     Effective dates: 05/19/22 through 06/06/23  [name], PharmD, BCPS, BCCP, CPP Heart Failure Clinic Pharmacist [phone]

## 2022-05-25 ENCOUNTER — Ambulatory Visit (HOSPITAL_COMMUNITY)
Admission: RE | Admit: 2022-05-25 | Discharge: 2022-05-25 | Disposition: A | Discharge status: Home / Self Care | Payer: Medicare PPO | Source: Ambulatory Visit | Attending: Cardiology | Admitting: Cardiology

## 2022-05-25 ENCOUNTER — Encounter (HOSPITAL_COMMUNITY): Payer: Self-pay | Admitting: Cardiology

## 2022-05-25 VITALS — BP 110/70 | HR 58 | Wt 162.2 lb

## 2022-05-25 DIAGNOSIS — I071 Rheumatic tricuspid insufficiency: Secondary | ICD-10-CM | POA: Insufficient documentation

## 2022-05-25 DIAGNOSIS — G4733 Obstructive sleep apnea (adult) (pediatric): Secondary | ICD-10-CM | POA: Diagnosis not present

## 2022-05-25 DIAGNOSIS — K219 Gastro-esophageal reflux disease without esophagitis: Secondary | ICD-10-CM | POA: Insufficient documentation

## 2022-05-25 DIAGNOSIS — E039 Hypothyroidism, unspecified: Secondary | ICD-10-CM | POA: Diagnosis not present

## 2022-05-25 DIAGNOSIS — Z79899 Other long term (current) drug therapy: Secondary | ICD-10-CM | POA: Insufficient documentation

## 2022-05-25 DIAGNOSIS — Z794 Long term (current) use of insulin: Secondary | ICD-10-CM | POA: Diagnosis not present

## 2022-05-25 DIAGNOSIS — I272 Pulmonary hypertension, unspecified: Secondary | ICD-10-CM | POA: Diagnosis not present

## 2022-05-25 DIAGNOSIS — K7460 Unspecified cirrhosis of liver: Secondary | ICD-10-CM | POA: Insufficient documentation

## 2022-05-25 DIAGNOSIS — N183 Chronic kidney disease, stage 3 unspecified: Secondary | ICD-10-CM | POA: Insufficient documentation

## 2022-05-25 DIAGNOSIS — J984 Other disorders of lung: Secondary | ICD-10-CM | POA: Diagnosis not present

## 2022-05-25 DIAGNOSIS — E1122 Type 2 diabetes mellitus with diabetic chronic kidney disease: Secondary | ICD-10-CM | POA: Diagnosis not present

## 2022-05-25 DIAGNOSIS — I5032 Chronic diastolic (congestive) heart failure: Secondary | ICD-10-CM | POA: Diagnosis not present

## 2022-05-25 DIAGNOSIS — Z7984 Long term (current) use of oral hypoglycemic drugs: Secondary | ICD-10-CM | POA: Insufficient documentation

## 2022-05-25 DIAGNOSIS — I130 Hypertensive heart and chronic kidney disease with heart failure and stage 1 through stage 4 chronic kidney disease, or unspecified chronic kidney disease: Secondary | ICD-10-CM | POA: Diagnosis not present

## 2022-05-25 LAB — BASIC METABOLIC PANEL
Anion gap: 8 (ref 5–15)
BUN: 15 mg/dL (ref 8–23)
CO2: 25 mmol/L (ref 22–32)
Calcium: 9.3 mg/dL (ref 8.9–10.3)
Chloride: 107 mmol/L (ref 98–111)
Creatinine, Ser: 1.41 mg/dL — ABNORMAL HIGH (ref 0.44–1.00)
GFR, Estimated: 39 mL/min — ABNORMAL LOW (ref >60)
Glucose, Bld: 104 mg/dL — ABNORMAL HIGH (ref 70–99)
Potassium: 4.1 mmol/L (ref 3.5–5.1)
Sodium: 140 mmol/L (ref 135–145)

## 2022-05-25 LAB — BRAIN NATRIURETIC PEPTIDE: B Natriuretic Peptide: 298.4 pg/mL — ABNORMAL HIGH (ref 0.0–100.0)

## 2022-05-25 NOTE — Patient Instructions (Signed)
Good to see you today!  No medication changes were made  Labs done today, your results will be available in MyChart, we will contact you for abnormal readings.  Your physician recommends that you schedule a follow-up appointment in: 3 months  If you have any questions or concerns before your next appointment please send [place] a message through [place] or call our office at [phone].    TO LEAVE A MESSAGE FOR THE NURSE SELECT OPTION 2, PLEASE LEAVE A MESSAGE INCLUDING: YOUR NAME DATE OF BIRTH CALL BACK NUMBER REASON FOR CALL**this is important as we prioritize the call backs  YOU WILL RECEIVE A CALL BACK THE SAME DAY AS LONG AS YOU CALL BEFORE 4:00 PM  At the [facility], you and your health needs are our priority. As part of our continuing mission to provide you with exceptional heart care, we have created designated Provider Care Teams. These Care Teams include your primary Cardiologist (physician) and Advanced Practice Providers (APPs- Physician Assistants and Nurse Practitioners) who all work together to provide you with the care you need, when you need it.   You may see any of the following providers on your designated Care Team at your next follow up: Dr Glori Bickers Dr Loralie Champagne Dr. Roxana Hires, NP Lyda Jester, [place] [facility] [place], [place] Forestine Na, NP [name], PharmD   Please be sure to bring in all your medications bottles to every appointment.   Do the following things EVERYDAY: Weigh yourself in the morning before breakfast. Write it down and keep it in a log. Take your medicines as prescribed Eat low salt foods--Limit salt (sodium) to 2000 mg per day.  Stay as active as you can everyday Limit all fluids for the day to less than 2 liters

## 2022-05-25 NOTE — Progress Notes (Signed)
PCP: [name], FNP HF Cardiology: Dr. [name]  76 y.o. with history of HTN, type 2 diabetes, OSA on CPAP, and pulmonary hypertension/RV failure was referred by Dr. Fletcher Anon for evaluation of pulmonary hypertension. It appears that [place] was diagnosed as early as 2018 by echo.  Echo in 7681 showed PA systolic pressure 85 mmHg.  She says that she was told that the pulmonary hypertension was due to OSA.  She has been compliant with CPAP. She reports exertional dyspnea for about 4 years now.   In 8/21, she was admitted to [facility] with CHF.  Echo showed EF 55-60%, mild LVH, D-shaped septum, severe RV dilation with moderate RV systolic dysfunction, PASP 83, moderate TR, moderate PI, dilated IVC.  RHC/LHC showed no coronary disease, severe pulmonary hypertension with PVR 7.38 and preserved cardiac output.  High resolution CT chest in 9/21 showed no evidence for fibrotic ILD.    She saw rheumatology and was given no definite rheumatological diagnosis.    Echo in 7/22 showed EF 65-70% with moderate LVH, RV moderately dilated with moderately decreased RV systolic function, mild RVH, D-shaped septum, PASP 85 mmHg, moderate PR, normal IVC.  Echo in 9/23 showed EF 55-60%, D-shaped septum, severe RV dilation with severe RV dysfunction, mild TR, PASP 50 mmHg.   She returns for followup of pulmonary hypertension.  She is off Uptravi, unable to tolerate even the lowest dose due to diarrhea and headache.  She tried Tyvaso DPI; she developed headaches and did not like inhaling and found the device hard to manipulate so she stopped it.  She is not interested in trying the inhaler form of Tyvaso.  At last appointment, I offered her a trial of orenitram but she decided not to take it given the side effects she had with Uptravi.  At last appointment, patient was doing quite poorly and was volume overloadeded.  Lasix was increased.  Today, she says that she is doing much better symptomatically.  She walked into the office today  (did not take wheelchair).  She denies dyspnea walking on flat ground though she is not very active.  No chest pain.  No palpitations or lightheadedness. Of note, her sister and husband both recently passed away.   ECG (personally reviewed): NSR, iRBBB, inferolateral TWIs  Labs (8/21): K 3.3, creatinine 1.39, ANA negative, anti-Sm negative, anti-SCL-70 negative, ANCA negative, RF elevated 52 but CCP negative, ESR 67, CRP 2.2 Labs (9/21): K 4.5, creatinine 1.8 => 1.45, anti-centromere Ab negative, BNP 309 Labs (11/21): K 4.1, creatinine 1.47, BNP 590 Labs (1/22): LDL 55, K 3.8, creatinine 1.49 Labs (5/22): LDL 74, K 4, creatinine 1.5 Labs (9/22): K 3.8, creatinine 1.43 Labs (12/22): LDL 54, K 3.8, creatinine 1.3 Labs (2/23): BNP 197, K 3.4, creatinine 1.3 Labs (6/23): BNP 862 Labs (9/23): K 4, creatinine 1.87  6 minute walk (8/21): 154 m 6 minute walk (11/21): 183 m 6 minute walk (3/22): 351 m 6 minute walk (11/22): 381 m 6 minute walk (2/23): 290 m (off Uptravi)  PMH: 1. HTN 2. Type 2 diabetes 3. OSA: Uses CPAP.  4. GERD 5. Hypothyroidism 6. Pulmonary hypertension/RV failure: Pulmonary hypertension diagnosed 2018 by echo.  - PFTs (6/18): moderate restriction - Echo 2020 with PASP 85 mmHg.  - Echo (8/21): EF 55-60%, mild LVH, D-shaped septum, severe RV dilation with moderate RV systolic dysfunction, PASP 83, moderate TR, moderate PI, dilated IVC.  - LHC/RHC (8/21): Normal coronaries; mean RA 3, PA 82/21 mean 44, mean PCWP 14, CI  2.2, PVR 7.38 WU.  - V/Q scan (8/21): No evidence for chronic PE.  - Serologic workup: ANA negative, anti-Sm negative, anti-SCL-70 negative, ANCA negative, RF elevated 52 but CCP negative, ESR 67, CRP 2.2, anti-centromere negative - High resolution CT chest: No fibrotic ILD.  - Echo (7/22): EF 65-70% with moderate LVH, RV moderately dilated with moderately decreased RV systolic function, mild RVH, D-shaped septum, PASP 85 mmHg, moderate PR, normal IVC.  -  Echo (9/23): EF 55-60%, D-shaped septum, severe RV dilation with severe RV dysfunction, mild TR, PASP 50 mmHg. 7. CKD stage 3.  8. Cirrhosis: Noted on 9/23 CT abdomen/pelvis. Suspect cardiogenic.   Social History   Socioeconomic History   Marital status: Married    Spouse name: Jimmy   Number of children: 3   Years of education: 14   Highest education level: Not on file  Occupational History    Comment: work part time, day care  Tobacco Use   Smoking status: Never   Smokeless tobacco: Never  Vaping Use   Vaping Use: Never used  Substance and Sexual Activity   Alcohol use: No    Alcohol/week: 0.0 standard drinks of alcohol   Drug use: No   Sexual activity: Never  Other Topics Concern   Not on file  Social History Narrative   Lives with husband   Caffeine use- coffee 2 cups daily, Coke once a week, green tea daily   Social Determinants of Health   Financial Resource Strain: Not on file  Food Insecurity: Not on file  Transportation Needs: Not on file  Physical Activity: Not on file  Stress: Not on file  Social Connections: Not on file  Intimate Partner Violence: Not on file   Family History  Problem Relation Age of Onset   Diabetes Mother    Hypertension Mother    Stroke Mother    Heart disease Mother        MI   Cancer Father        pancreatic   Stroke Father    Hypertension Sister    Diabetes Brother    Diabetes Maternal Grandfather    Diabetes Sister    Diabetes Sister    Diabetes Brother    Diabetes Brother    ROS: All systems reviewed and negative except as per HPI.   Current Outpatient Medications  Medication Sig Dispense Refill   ambrisentan (LETAIRIS) 10 MG tablet Take 1 tablet (10 mg total) by mouth daily. 30 tablet 3   amLODipine (NORVASC) 10 MG tablet Take 1 tablet (10 mg total) by mouth daily. 90 tablet 3   aspirin 81 MG tablet Take 81 mg by mouth daily.     Cholecalciferol (VITAMIN D3) 125 MCG (5000 UT) CAPS Take 1 capsule by mouth daily.      clonazePAM (KLONOPIN) 1 MG tablet Take 1 mg by mouth 2 (two) times daily as needed.     dapagliflozin propanediol (FARXIGA) 10 MG TABS tablet Take 1 tablet (10 mg total) by mouth daily before breakfast. 90 tablet 3   escitalopram (LEXAPRO) 10 MG tablet Take 10 mg by mouth daily.     esomeprazole (NEXIUM) 40 MG capsule Take 40 mg by mouth daily at 12 noon.     furosemide (LASIX) 20 MG tablet Take 2 tablets (40 mg total) by mouth daily. 90 tablet 3   hydrALAZINE (APRESOLINE) 50 MG tablet Take 50 mg by mouth 2 (two) times daily.     insulin aspart (NOVOLOG) 100 UNIT/ML injection  Inject 4 Units into the skin. Bs greater than 200     Insulin Degludec (TRESIBA [place]) Inject 16 Units into the skin daily in the afternoon.     Insulin Pen Needle (B-D UF III MINI PEN NEEDLES) 31G X 5 MM MISC 4 (four) times daily     levothyroxine (SYNTHROID) 75 MCG tablet Take 75 mcg by mouth daily before breakfast.     loratadine (CLARITIN) 10 MG tablet Take 10 mg by mouth daily.      meclizine (ANTIVERT) 25 MG tablet Take 25 mg by mouth 3 (three) times daily as needed for dizziness.      metoprolol succinate (TOPROL-XL) 50 MG 24 hr tablet Take 50 mg by mouth daily. Take with or immediately following a meal.     Multiple Vitamins-Minerals (MULTI ADULT GUMMIES) CHEW Chew by mouth daily.     nitroGLYCERIN (NITROSTAT) 0.4 MG SL tablet      potassium chloride (KLOR-CON M) 10 MEQ tablet Take 10 mEq by mouth daily.     rosuvastatin (CRESTOR) 10 MG tablet Take 10 mg by mouth daily.     tadalafil, PAH, (ADCIRCA) 20 MG tablet Take 2 tablets (40 mg total) by mouth daily. 60 tablet 11   Fe Fum-FA-B Cmp-C-Zn-Mg-Mn-Cu (HEMOCYTE PLUS) 106-1 MG CAPS Take 1 capsule by mouth daily. (Patient not taking: Reported on 05/25/2022)  6   No current facility-administered medications for this encounter.   BP 110/70   Pulse (!) 58   Wt 73.6 kg (162 lb 3.2 oz)   LMP  (LMP Unknown)   SpO2 98%   BMI 25.40 kg/m  General: NAD Neck: No JVD, no  thyromegaly or thyroid nodule.  Lungs: Clear to auscultation bilaterally with normal respiratory effort. CV: Nondisplaced PMI.  Heart regular S1/S2, no S3/S4, 1/6 SEM LLSB.  No peripheral edema.  No carotid bruit.  Normal pedal pulses.  Abdomen: Soft, nontender, no hepatosplenomegaly, no distention.  Skin: Intact without lesions or rashes.  Neurologic: Alert and oriented x 3.  Psych: Normal affect. Extremities: No clubbing or cyanosis.  HEENT: Normal.   Assessment/Plan: 1. HTN: BP controlled.  - Continue amlodipine.  2. OSA: Continue CPAP.    3. Pulmonary HTN/RV failure: Echo in 8/21 with EF 55-60%, mild LVH, D-shaped septum, severe RV dilation with moderate RV systolic dysfunction, PASP 83, moderate TR, moderate PI, dilated IVC.  Suspect long-standing RV dysfunction based on appearance of echo. [place] in 8/21 with severe pulmonary arterial hypertension, preserved cardiac output. PFTs from 6/18 showed moderate restriction.  V/Q scan from 8/21 showed no evidence for chronic PE. She has treated OSA.  Serologic workup was negative except for elevated CRP and ESR and elevated RF (normal CCP).  High resolution CT chest did not show evidence for ILD. I suspect long-standing group 1 PAH. Repeat echo in 7/22 showed EF 65-70% with moderate LVH, RV moderately dilated with moderately decreased RV systolic function, mild RVH, D-shaped septum, PASP 85 mmHg, moderate PR, normal IVC. Though echo still showed significant RV dysfunction, she symptomatically has felt better on PH meds.  She had to stop Uptravi due to intractable diarrhea and headache.  She was unable to use Tyvaso.  Echo in 9/23 showed EF 55-60%, D-shaped septum, severe RV dilation with severe RV dysfunction, mild TR, PASP 50 mmHg.  She is doing better today, NYHA class II.  She is not volume overloaded on exam.  - We will do a 6 minute walk at next appointment due to construction today.   -  Continue Lasix 40 mg daily, BMET/BNP today.   - Continue  Farxiga 10 mg daily.  - Continue ambrisentan 10.  - Continue tadalafil 40 mg daily (felt worse when off x 1 month).    - Unable to tolerate Uptravi but when she was on it, she had much better 6 minute walk and improved breathing. She could not handle Tyvaso DPI and does not want to try to the Tyvaso inhaler. She does not want to try orenitram due to concerns that she will have the same symptoms as with Uptravi.  She is agreeable to try it if her symptoms worsen.  - Continue CPAP (OSA likely contributes but certainly cannot explain the extent of her [place]).  4. Pulmonary insufficiency: Moderate on 9/23 echo.   5. Cirrhosis: Noted on imaging.  Likely due  to RV failure and passive congestion.   Followup in 3 months.   Loralie Champagne 05/25/2022

## 2022-06-04 DIAGNOSIS — E782 Mixed hyperlipidemia: Secondary | ICD-10-CM | POA: Diagnosis not present

## 2022-06-04 DIAGNOSIS — I10 Essential (primary) hypertension: Secondary | ICD-10-CM | POA: Diagnosis not present

## 2022-06-05 ENCOUNTER — Other Ambulatory Visit (HOSPITAL_COMMUNITY): Payer: Self-pay | Admitting: Cardiology

## 2022-06-05 DIAGNOSIS — I270 Primary pulmonary hypertension: Secondary | ICD-10-CM

## 2022-06-15 DIAGNOSIS — H2513 Age-related nuclear cataract, bilateral: Secondary | ICD-10-CM | POA: Diagnosis not present

## 2022-06-15 DIAGNOSIS — Z0100 Encounter for examination of eyes and vision without abnormal findings: Secondary | ICD-10-CM | POA: Diagnosis not present

## 2022-06-15 LAB — HM DIABETES EYE EXAM

## 2022-06-16 ENCOUNTER — Telehealth (HOSPITAL_COMMUNITY): Payer: Self-pay | Admitting: Pharmacy Technician

## 2022-06-16 NOTE — Telephone Encounter (Signed)
Advanced Heart Failure Patient Advocate Encounter  Patient's daughter left vm wanting to know if we had heard anything regarding the renewal of her mom's TAF grant. She has not received any information. The patient's daughter did put in her wait list ID in time, as I assisted with that and we were provided a confirmation number.  Checked the [employer], says grant is inactive. Provided the phone number to TAF [phone]) and encouraged her to call for an update. They will only provide the office so much information.   Advised her to call me back with an update. Will assist as able.

## 2022-06-20 NOTE — Telephone Encounter (Signed)
Advanced Heart Failure Patient Advocate Encounter  Patient's daughter left vm that TAF did not award her a [facility] grant. Went ahead and got PAN grant for Ambrisentan.   Member ID: [id number] Group ID: [id number] RxBin ID: [id number] PCN: [id number] Eligibility Start Date: 03/22/2022 Eligibility End Date: 06/20/2023 Assistance Amount: $3,250.00  Will follow up with patient's daughter.

## 2022-06-21 ENCOUNTER — Other Ambulatory Visit (HOSPITAL_COMMUNITY): Payer: Self-pay

## 2022-06-21 NOTE — Telephone Encounter (Signed)
Advanced Heart Failure Patient Advocate Encounter  Emailed patient's daughter copy of PAN grant information. Spoke about switching Ambrisentan to [facility] specialty pharmacy now that we can dispense the medication. Current 30 day co-pay, $100. Sharlee Blew will speak with her mom and see if she is wanting to change pharmacies. If so, she will give me a call back. She states that she has a few months of Ambrisentan on hand, so nothing needed at this time.  Charlann Boxer, CPhT

## 2022-06-22 ENCOUNTER — Other Ambulatory Visit (HOSPITAL_COMMUNITY): Payer: Self-pay | Admitting: Cardiology

## 2022-06-22 DIAGNOSIS — I272 Pulmonary hypertension, unspecified: Secondary | ICD-10-CM

## 2022-06-22 MED ORDER — TADALAFIL (PAH) 20 MG PO TABS: 40.0000 mg | ORAL_TABLET | Freq: Every day | ORAL | 11 refills | Status: DC

## 2022-06-28 ENCOUNTER — Other Ambulatory Visit: Payer: Self-pay | Admitting: Nurse Practitioner

## 2022-06-28 DIAGNOSIS — R161 Splenomegaly, not elsewhere classified: Secondary | ICD-10-CM | POA: Diagnosis not present

## 2022-06-28 DIAGNOSIS — R932 Abnormal findings on diagnostic imaging of liver and biliary tract: Secondary | ICD-10-CM

## 2022-06-28 DIAGNOSIS — K760 Fatty (change of) liver, not elsewhere classified: Secondary | ICD-10-CM | POA: Diagnosis not present

## 2022-07-05 ENCOUNTER — Ambulatory Visit
Admission: RE | Admit: 2022-07-05 | Discharge: 2022-07-05 | Disposition: A | Discharge status: Home / Self Care | Payer: Medicare PPO | Source: Ambulatory Visit | Attending: Nurse Practitioner | Admitting: Nurse Practitioner

## 2022-07-05 DIAGNOSIS — K760 Fatty (change of) liver, not elsewhere classified: Secondary | ICD-10-CM | POA: Diagnosis not present

## 2022-07-05 DIAGNOSIS — R932 Abnormal findings on diagnostic imaging of liver and biliary tract: Secondary | ICD-10-CM

## 2022-07-05 DIAGNOSIS — R161 Splenomegaly, not elsewhere classified: Secondary | ICD-10-CM

## 2022-07-15 ENCOUNTER — Encounter: Payer: Self-pay | Admitting: Family

## 2022-07-15 ENCOUNTER — Ambulatory Visit: Payer: Medicare PPO | Admitting: Family

## 2022-07-15 VITALS — BP 124/70 | HR 70 | Ht 67.0 in | Wt 167.0 lb

## 2022-07-15 DIAGNOSIS — E039 Hypothyroidism, unspecified: Secondary | ICD-10-CM

## 2022-07-15 DIAGNOSIS — E785 Hyperlipidemia, unspecified: Secondary | ICD-10-CM | POA: Diagnosis not present

## 2022-07-15 DIAGNOSIS — E782 Mixed hyperlipidemia: Secondary | ICD-10-CM

## 2022-07-15 DIAGNOSIS — F32A Depression, unspecified: Secondary | ICD-10-CM | POA: Diagnosis not present

## 2022-07-15 DIAGNOSIS — I10 Essential (primary) hypertension: Secondary | ICD-10-CM

## 2022-07-15 DIAGNOSIS — E1169 Type 2 diabetes mellitus with other specified complication: Secondary | ICD-10-CM | POA: Diagnosis not present

## 2022-07-15 DIAGNOSIS — F419 Anxiety disorder, unspecified: Secondary | ICD-10-CM

## 2022-07-15 DIAGNOSIS — E1165 Type 2 diabetes mellitus with hyperglycemia: Secondary | ICD-10-CM | POA: Diagnosis not present

## 2022-07-15 DIAGNOSIS — J849 Interstitial pulmonary disease, unspecified: Secondary | ICD-10-CM | POA: Diagnosis not present

## 2022-07-15 DIAGNOSIS — E538 Deficiency of other specified B group vitamins: Secondary | ICD-10-CM | POA: Diagnosis not present

## 2022-07-15 DIAGNOSIS — E559 Vitamin D deficiency, unspecified: Secondary | ICD-10-CM

## 2022-07-15 DIAGNOSIS — I5032 Chronic diastolic (congestive) heart failure: Secondary | ICD-10-CM

## 2022-07-15 DIAGNOSIS — N1832 Chronic kidney disease, stage 3b: Secondary | ICD-10-CM

## 2022-07-15 DIAGNOSIS — J449 Chronic obstructive pulmonary disease, unspecified: Secondary | ICD-10-CM | POA: Insufficient documentation

## 2022-07-15 DIAGNOSIS — D631 Anemia in chronic kidney disease: Secondary | ICD-10-CM

## 2022-07-15 LAB — POCT CBG (FASTING - GLUCOSE)-MANUAL ENTRY: Glucose Fasting, POC: 129 mg/dL — AB (ref 70–99)

## 2022-07-15 NOTE — Patient Instructions (Signed)
Pick up either SlowFE (Generic brand is okay) or Ferrous Gluconate supplements for anemia.

## 2022-07-15 NOTE — Progress Notes (Signed)
Established Patient Office Visit  Subjective:  Patient ID: Jamie Murray, female    DOB: 1945/12/24  Age: 77 y.o. MRN: [id number]  Chief Complaint  Patient presents with   Follow-up    4 month follow up    Patient is here for her 3 month f/u.   She has been feeling well since her last appointment.    Due for labs No other concerns at this time     HM Colonoscopy         COLONOSCOPY (Pts 45-88yr Insurance coverage will need to be confirmed)  (Every 10 Years) Next due on 02/19/2023   02/18/2013  HM Colonoscopy component of HM COLONOSCOPY   Only the first 1 history entries have been loaded, but more history  exists.         Health Maintenance reviewed - patient asked to schedule her mammogram   Depression      The patient presents with depression.  This is a chronic problem.  The current episode started more than 1 year ago.   The onset quality is undetermined.   The problem occurs every several days.  The problem has been gradually improving since onset.  Associated symptoms include decreased concentration, insomnia, irritable, restlessness and sad.     The symptoms are aggravated by nothing.  Past treatments include SSRIs - Selective serotonin reuptake inhibitors and SNRIs - Serotonin and norepinephrine reuptake inhibitors.  Compliance with treatment is variable.  Past compliance problems: side effects.  Previous treatment provided mild relief.  Risk factors include a recent illness, marital problems and major life event (Death of Spouse).   Past medical history includes chronic illness, recent illness, anxiety and depression.   Diabetes She presents for her follow-up diabetic visit. She has type 2 diabetes mellitus. No MedicAlert identification noted. Her disease course has been stable. There are no diabetic associated symptoms. There are no hypoglycemic complications. Symptoms are stable. Diabetic complications include peripheral neuropathy and PVD. Risk factors for  coronary artery disease include diabetes mellitus, dyslipidemia, hypertension and stress. Current diabetic treatment includes diet and oral agent (monotherapy) (injection). She is compliant with treatment all of the time. An ACE inhibitor/angiotensin II receptor blocker is being taken. Eye exam is current.  Hypertension This is a chronic problem. The current episode started more than 1 year ago. The problem is unchanged. The problem is controlled. Associated symptoms include anxiety and shortness of breath. There are no associated agents to hypertension. Risk factors for coronary artery disease include diabetes mellitus, dyslipidemia, family history and post-menopausal state. Past treatments include ACE inhibitors, angiotensin blockers, diuretics and lifestyle changes. The current treatment provides significant improvement. There are no compliance problems.  Hypertensive end-organ damage includes heart failure and PVD.     Past Medical History:  Diagnosis Date   Allergy    Anemia    Anxiety    Chest pain, rule out acute myocardial infarction 08/09/2016   COPD (chronic obstructive pulmonary disease) (HCC)    Depression    Diabetes (HCC)    Diarrhea, functional 01/25/2016   Edema of lower extremity 02/03/2020   Elevated troponin level 01/09/2020   GERD (gastroesophageal reflux disease)    Hyperlipidemia    Hypertension    Hypokalemia 12/22/2015   Hypomagnesemia 03/22/2016   Insomnia    Macrocytosis 01/25/2016   Migraines    Osteoporosis    [facility]    Transient cerebral ischemia    Vertigo    every 2-3 months  Social History   Socioeconomic History   Marital status: Widowed    Spouse name: [name]   Number of children: 3   Years of education: 14   Highest education level: Not on file  Occupational History    Comment: work part time, day care   Occupation: Retired  Tobacco Use   Smoking status: Never   Smokeless tobacco: Never  Vaping Use   Vaping Use:  Never used  Substance and Sexual Activity   Alcohol use: No    Alcohol/week: 0.0 standard drinks of alcohol   Drug use: No   Sexual activity: Not Currently    Birth control/protection: None, Post-menopausal  Other Topics Concern   Not on file  Social History Narrative   Caffeine use- coffee 2 cups daily, Coke once a week, green tea daily   Social Determinants of Health   Financial Resource Strain: Not on file  Food Insecurity: Not on file  Transportation Needs: Not on file  Physical Activity: Not on file  Stress: Not on file  Social Connections: Not on file  Intimate Partner Violence: Not on file    Family History  Problem Relation Age of Onset   Diabetes Mother    Hypertension Mother    Stroke Mother    Heart disease Mother        MI   Cancer Father        pancreatic   Stroke Father    Hypertension Sister    Diabetes Brother    Diabetes Maternal Grandfather    Diabetes Sister    Diabetes Sister    Diabetes Brother    Diabetes Brother     Allergies  Allergen Reactions   Atorvastatin     Note: joint pain   Fluoxetine Other (See Comments)    "felt crazy"   Levemir [Insulin Detemir] Other (See Comments)    Bruising   Prozac [Fluoxetine Hcl] Other (See Comments)    "felt crazy"    Review of Systems  Respiratory:  Positive for shortness of breath.   Psychiatric/Behavioral:  Positive for decreased concentration and depression. The patient has insomnia.   All other systems reviewed and are negative.      Objective:   BP 124/70   Pulse 70   Ht 5' 7"$  (1.702 m)   Wt 167 lb (75.8 kg)   LMP  (LMP Unknown)   SpO2 99%   BMI 26.16 kg/m   Vitals:   07/15/22 1121  BP: 124/70  Pulse: 70  Height: 5' 7"$  (1.702 m)  Weight: 167 lb (75.8 kg)  SpO2: 99%  BMI (Calculated): 26.15    Physical Exam Vitals and nursing note reviewed.  Constitutional:      General: She is irritable.     Appearance: Normal appearance. She is normal weight.  HENT:     Head:  Normocephalic.     Right Ear: External ear normal.     Left Ear: External ear normal.     Nose: Nose normal.  Eyes:     Pupils: Pupils are equal, round, and reactive to light.  Cardiovascular:     Rate and Rhythm: Normal rate and regular rhythm.     Heart sounds: Normal heart sounds.  Pulmonary:     Effort: Pulmonary effort is normal.     Breath sounds: Normal breath sounds.  Musculoskeletal:     Cervical back: Normal range of motion.  Skin:    General: Skin is dry.  Neurological:     General:  No focal deficit present.     Mental Status: She is alert and oriented to person, place, and time.     Gait: Gait abnormal.     Comments: Patient walks with a limp and uses a cane for ambulation.   Psychiatric:        Attention and Perception: Attention and perception normal.        Mood and Affect: Mood is anxious and depressed.        Speech: Speech normal.        Behavior: Behavior normal.        Thought Content: Thought content normal.        Cognition and Memory: Cognition normal.        Judgment: Judgment normal.      Results for orders placed or performed in visit on 07/15/22  Microalbumin / creatinine urine ratio  Result Value Ref Range   Microalb Creat Ratio 30-300   Protein / creatinine ratio, urine  Result Value Ref Range   Creatinine, Urine 50    Albumin, U 30   POCT CBG (Fasting - Glucose)  Result Value Ref Range   Glucose Fasting, POC 129 (A) 70 - 99 mg/dL  HM DIABETES EYE EXAM  Result Value Ref Range   HM Diabetic Eye Exam No Retinopathy No Retinopathy    Recent Results (from the past 2160 hour(s))  Basic metabolic panel     Status: Abnormal   Collection Time: 05/25/22 11:03 AM  Result Value Ref Range   Sodium 140 135 - 145 mmol/L   Potassium 4.1 3.5 - 5.1 mmol/L   Chloride 107 98 - 111 mmol/L   CO2 25 22 - 32 mmol/L   Glucose, Bld 104 (H) 70 - 99 mg/dL    Comment: Glucose reference range applies only to samples taken after fasting for at least 8 hours.    BUN 15 8 - 23 mg/dL   Creatinine, Ser 1.41 (H) 0.44 - 1.00 mg/dL   Calcium 9.3 8.9 - 10.3 mg/dL   GFR, Estimated 39 (L) >60 mL/min    Comment: (NOTE) Calculated using the CKD-EPI Creatinine Equation (2021)    Anion gap 8 5 - 15    Comment: Performed at [facility] [address] [place], [place] [address]  B Nat Peptide     Status: Abnormal   Collection Time: 05/25/22 11:03 AM  Result Value Ref Range   B Natriuretic Peptide 298.4 (H) 0.0 - 100.0 pg/mL    Comment: Performed at [facility] [address] [place], [place] [address]  HM DIABETES EYE EXAM     Status: None   Collection Time: 06/15/22 12:00 AM  Result Value Ref Range   HM Diabetic Eye Exam No Retinopathy No Retinopathy  POCT CBG (Fasting - Glucose)     Status: Abnormal   Collection Time: 07/15/22 11:23 AM  Result Value Ref Range   Glucose Fasting, POC 129 (A) 70 - 99 mg/dL      Assessment & Plan:   Problem List Items Addressed This Visit     Anxiety and depression   Relevant Orders   TSH   Hypothyroidism   Relevant Orders   TSH   Hypertension   Relevant Orders   CBC With Differential   CMP14+EGFR   Type 2 diabetes mellitus with hyperglycemia (HCC) - Primary   Relevant Orders   POCT CBG (Fasting - Glucose) (Completed)   CBC With Differential   CMP14+EGFR   Hemoglobin  A1c   Hyperlipidemia   Relevant Orders   Lipid panel   Anemia due to stage 3b chronic kidney disease (HCC)   Chronic obstructive pulmonary disease, unspecified COPD type ([place])   Interstitial pulmonary disease ([place])   Other Visit Diagnoses     Vitamin D deficiency, unspecified       Checking Vitamin D level today WIll supplement as needed.   Relevant Orders   VITAMIN D 25 Hydroxy (Vit-D Deficiency, Fractures)   B12 deficiency due to diet       Checking B12 in office today.  Will supplement B12 as needed   Relevant Orders   Vitamin B12   Chronic diastolic heart failure (HCC)   (Chronic)         Return in  about 3 months (around 10/13/2022).   Total time spent: 30 minutes  [name], FNP  07/15/2022

## 2022-07-16 LAB — LIPID PANEL
Chol/HDL Ratio: 3.1 ratio (ref 0.0–4.4)
Cholesterol, Total: 131 mg/dL (ref 100–199)
HDL: 42 mg/dL (ref >39)
LDL Chol Calc (NIH): 70 mg/dL (ref 0–99)
Triglycerides: 105 mg/dL (ref 0–149)
VLDL Cholesterol Cal: 19 mg/dL (ref 5–40)

## 2022-07-16 LAB — CMP14+EGFR
ALT: 10 IU/L (ref 0–32)
AST: 16 IU/L (ref 0–40)
Albumin/Globulin Ratio: 1.8 (ref 1.2–2.2)
Albumin: 4.2 g/dL (ref 3.8–4.8)
Alkaline Phosphatase: 83 IU/L (ref 44–121)
BUN/Creatinine Ratio: 12 (ref 12–28)
BUN: 17 mg/dL (ref 8–27)
Bilirubin Total: 0.2 mg/dL (ref 0.0–1.2)
CO2: 20 mmol/L (ref 20–29)
Calcium: 9 mg/dL (ref 8.7–10.3)
Chloride: 108 mmol/L — ABNORMAL HIGH (ref 96–106)
Creatinine, Ser: 1.39 mg/dL — ABNORMAL HIGH (ref 0.57–1.00)
Globulin, Total: 2.4 g/dL (ref 1.5–4.5)
Glucose: 106 mg/dL — ABNORMAL HIGH (ref 70–99)
Potassium: 3.9 mmol/L (ref 3.5–5.2)
Sodium: 144 mmol/L (ref 134–144)
Total Protein: 6.6 g/dL (ref 6.0–8.5)
eGFR: 40 mL/min/{1.73_m2} — ABNORMAL LOW (ref >59)

## 2022-07-16 LAB — CBC WITH DIFFERENTIAL
Basophils Absolute: 0 10*3/uL (ref 0.0–0.2)
Basos: 0 %
EOS (ABSOLUTE): 0.1 10*3/uL (ref 0.0–0.4)
Eos: 2 %
Hematocrit: 35.3 % (ref 34.0–46.6)
Hemoglobin: 11.8 g/dL (ref 11.1–15.9)
Immature Grans (Abs): 0 10*3/uL (ref 0.0–0.1)
Immature Granulocytes: 0 %
Lymphocytes Absolute: 1.3 10*3/uL (ref 0.7–3.1)
Lymphs: 36 %
MCH: 29.8 pg (ref 26.6–33.0)
MCHC: 33.4 g/dL (ref 31.5–35.7)
MCV: 89 fL (ref 79–97)
Monocytes Absolute: 0.2 10*3/uL (ref 0.1–0.9)
Monocytes: 5 %
Neutrophils Absolute: 2.1 10*3/uL (ref 1.4–7.0)
Neutrophils: 57 %
RBC: 3.96 x10E6/uL (ref 3.77–5.28)
RDW: 13.8 % (ref 11.7–15.4)
WBC: 3.6 10*3/uL (ref 3.4–10.8)

## 2022-07-16 LAB — TSH: TSH: 3.15 u[IU]/mL (ref 0.450–4.500)

## 2022-07-16 LAB — HEMOGLOBIN A1C
Est. average glucose Bld gHb Est-mCnc: 134 mg/dL
Hgb A1c MFr Bld: 6.3 % — ABNORMAL HIGH (ref 4.8–5.6)

## 2022-07-16 LAB — VITAMIN D 25 HYDROXY (VIT D DEFICIENCY, FRACTURES): Vit D, 25-Hydroxy: 45.2 ng/mL (ref 30.0–100.0)

## 2022-07-18 DIAGNOSIS — I10 Essential (primary) hypertension: Secondary | ICD-10-CM | POA: Diagnosis not present

## 2022-07-18 DIAGNOSIS — N1832 Chronic kidney disease, stage 3b: Secondary | ICD-10-CM | POA: Diagnosis not present

## 2022-07-18 DIAGNOSIS — E1122 Type 2 diabetes mellitus with diabetic chronic kidney disease: Secondary | ICD-10-CM | POA: Diagnosis not present

## 2022-07-22 ENCOUNTER — Other Ambulatory Visit (HOSPITAL_COMMUNITY): Payer: Self-pay

## 2022-07-22 MED ORDER — POTASSIUM CHLORIDE ER 10 MEQ PO TBCR: 10.0000 meq | EXTENDED_RELEASE_TABLET | Freq: Every day | ORAL | 6 refills | Status: DC

## 2022-08-01 ENCOUNTER — Other Ambulatory Visit: Payer: Self-pay

## 2022-08-01 DIAGNOSIS — E1165 Type 2 diabetes mellitus with hyperglycemia: Secondary | ICD-10-CM

## 2022-08-01 MED ORDER — ACCU-CHEK GUIDE VI STRP: 3.0000 | ORAL_STRIP | Freq: Three times a day (TID) | 6 refills | Status: DC | PRN

## 2022-08-10 ENCOUNTER — Telehealth: Payer: Self-pay

## 2022-08-10 DIAGNOSIS — F418 Other specified anxiety disorders: Secondary | ICD-10-CM

## 2022-08-10 NOTE — Telephone Encounter (Signed)
Pt called asking if we can send a referral for her to see a therapist, said she has been down in the dumps recently & she tried to call but they said they needed a referral from [place]. She didn't mention a specific place, but just wants to go see a therapist please advise

## 2022-08-15 DIAGNOSIS — Z794 Long term (current) use of insulin: Secondary | ICD-10-CM | POA: Diagnosis not present

## 2022-08-15 DIAGNOSIS — I152 Hypertension secondary to endocrine disorders: Secondary | ICD-10-CM | POA: Diagnosis not present

## 2022-08-15 DIAGNOSIS — E1159 Type 2 diabetes mellitus with other circulatory complications: Secondary | ICD-10-CM | POA: Diagnosis not present

## 2022-08-15 DIAGNOSIS — E063 Autoimmune thyroiditis: Secondary | ICD-10-CM | POA: Diagnosis not present

## 2022-08-15 DIAGNOSIS — E782 Mixed hyperlipidemia: Secondary | ICD-10-CM | POA: Diagnosis not present

## 2022-08-15 DIAGNOSIS — E1142 Type 2 diabetes mellitus with diabetic polyneuropathy: Secondary | ICD-10-CM | POA: Diagnosis not present

## 2022-08-16 ENCOUNTER — Other Ambulatory Visit: Payer: Self-pay | Admitting: Family

## 2022-08-17 NOTE — Addendum Note (Signed)
Addended by: Georgian Co on: 08/17/2022 12:38 PM   Modules accepted: Orders

## 2022-08-23 ENCOUNTER — Other Ambulatory Visit (HOSPITAL_COMMUNITY): Payer: Self-pay

## 2022-08-24 ENCOUNTER — Encounter (HOSPITAL_COMMUNITY): Payer: Self-pay | Admitting: Cardiology

## 2022-08-24 ENCOUNTER — Ambulatory Visit (HOSPITAL_COMMUNITY)
Admission: RE | Admit: 2022-08-24 | Discharge: 2022-08-24 | Disposition: A | Discharge status: Home / Self Care | Payer: Medicare PPO | Source: Ambulatory Visit | Attending: Cardiology | Admitting: Cardiology

## 2022-08-24 VITALS — BP 118/70 | HR 54 | Wt 174.6 lb

## 2022-08-24 DIAGNOSIS — J984 Other disorders of lung: Secondary | ICD-10-CM | POA: Diagnosis not present

## 2022-08-24 DIAGNOSIS — N183 Chronic kidney disease, stage 3 unspecified: Secondary | ICD-10-CM | POA: Insufficient documentation

## 2022-08-24 DIAGNOSIS — I5032 Chronic diastolic (congestive) heart failure: Secondary | ICD-10-CM

## 2022-08-24 DIAGNOSIS — R519 Headache, unspecified: Secondary | ICD-10-CM | POA: Insufficient documentation

## 2022-08-24 DIAGNOSIS — G4733 Obstructive sleep apnea (adult) (pediatric): Secondary | ICD-10-CM | POA: Insufficient documentation

## 2022-08-24 DIAGNOSIS — Z79899 Other long term (current) drug therapy: Secondary | ICD-10-CM | POA: Diagnosis not present

## 2022-08-24 DIAGNOSIS — E1122 Type 2 diabetes mellitus with diabetic chronic kidney disease: Secondary | ICD-10-CM | POA: Insufficient documentation

## 2022-08-24 DIAGNOSIS — R7982 Elevated C-reactive protein (CRP): Secondary | ICD-10-CM | POA: Diagnosis not present

## 2022-08-24 DIAGNOSIS — I272 Pulmonary hypertension, unspecified: Secondary | ICD-10-CM

## 2022-08-24 DIAGNOSIS — I50810 Right heart failure, unspecified: Secondary | ICD-10-CM | POA: Diagnosis not present

## 2022-08-24 DIAGNOSIS — I129 Hypertensive chronic kidney disease with stage 1 through stage 4 chronic kidney disease, or unspecified chronic kidney disease: Secondary | ICD-10-CM | POA: Insufficient documentation

## 2022-08-24 DIAGNOSIS — R700 Elevated erythrocyte sedimentation rate: Secondary | ICD-10-CM | POA: Diagnosis not present

## 2022-08-24 DIAGNOSIS — K7460 Unspecified cirrhosis of liver: Secondary | ICD-10-CM | POA: Insufficient documentation

## 2022-08-24 DIAGNOSIS — I2721 Secondary pulmonary arterial hypertension: Secondary | ICD-10-CM | POA: Diagnosis not present

## 2022-08-24 DIAGNOSIS — R197 Diarrhea, unspecified: Secondary | ICD-10-CM | POA: Diagnosis not present

## 2022-08-24 LAB — BASIC METABOLIC PANEL
Anion gap: 9 (ref 5–15)
BUN: 33 mg/dL — ABNORMAL HIGH (ref 8–23)
CO2: 22 mmol/L (ref 22–32)
Calcium: 8.8 mg/dL — ABNORMAL LOW (ref 8.9–10.3)
Chloride: 112 mmol/L — ABNORMAL HIGH (ref 98–111)
Creatinine, Ser: 1.83 mg/dL — ABNORMAL HIGH (ref 0.44–1.00)
GFR, Estimated: 28 mL/min — ABNORMAL LOW (ref >60)
Glucose, Bld: 146 mg/dL — ABNORMAL HIGH (ref 70–99)
Potassium: 4.1 mmol/L (ref 3.5–5.1)
Sodium: 143 mmol/L (ref 135–145)

## 2022-08-24 LAB — BRAIN NATRIURETIC PEPTIDE: B Natriuretic Peptide: 2366.2 pg/mL — ABNORMAL HIGH (ref 0.0–100.0)

## 2022-08-24 MED ORDER — POTASSIUM CHLORIDE CRYS ER 20 MEQ PO TBCR: 20.0000 meq | EXTENDED_RELEASE_TABLET | Freq: Every day | ORAL | 3 refills | Status: DC

## 2022-08-24 MED ORDER — FUROSEMIDE 20 MG PO TABS: ORAL_TABLET | ORAL | 3 refills | Status: DC

## 2022-08-24 NOTE — Patient Instructions (Addendum)
Medication Changes:  CHANGE: lasix. 08/25/22 increase lasix to 40mg , take 2 tablets in the AM and 2 tablets in PM. Until 08/29/22 On 08/29/22 CHANGE LASIX to 40mg  AM (2 tabs), 20mg  PM (1 tab).   INCREASE potassium chloride to 20 mEq, once a day.   Lab Work:  Labs done today, your results will be available in MyChart, we will contact you for abnormal readings.  Your labs will be redrawn on April 1 @ 1:45PM   Special Instructions // Education:  Do the following things EVERYDAY: Weigh yourself in the morning before breakfast. Write it down and keep it in a log. Take your medicines as prescribed Eat low salt foods--Limit salt (sodium) to 2000 mg per day. NO MORE TV dinners, too much salt.  Stay as active as you can everyday Limit all fluids for the day to less than 2 liters   Follow-Up in: with NP clinic in 3 weeks.     At the [facility], you and your health needs are our priority. We have a designated team specialized in the treatment of Heart Failure. This Care Team includes your primary Heart Failure Specialized Cardiologist (physician), Advanced Practice Providers (APPs- Physician Assistants and Nurse Practitioners), and Pharmacist who all work together to provide you with the care you need, when you need it.   You may see any of the following providers on your designated Care Team at your next follow up:  Dr. Glori Bickers Dr. Loralie Champagne Dr. Roxana Hires, NP Lyda Jester, [place] [facility] [place], [place] Forestine Na, NP [name], PharmD   Please be sure to bring in all your medications bottles to every appointment.   Need to Contact us:  If you have any questions or concerns before your next appointment please send [place] a message through [place] or call our office at [phone].    TO LEAVE A MESSAGE FOR THE NURSE SELECT OPTION 2, PLEASE LEAVE A MESSAGE INCLUDING: YOUR NAME DATE OF BIRTH CALL BACK NUMBER REASON FOR  CALL**this is important as we prioritize the call backs  YOU WILL RECEIVE A CALL BACK THE SAME DAY AS LONG AS YOU CALL BEFORE 4:00 PM

## 2022-08-24 NOTE — Progress Notes (Signed)
PCP: [name], FNP HF Cardiology: Dr. [name]  77 y.o. with history of HTN, type 2 diabetes, OSA on CPAP, and pulmonary hypertension/RV failure was referred by Dr. Fletcher Anon for evaluation of pulmonary hypertension. It appears that Saunders was diagnosed as early as 2018 by echo.  Echo in [id number] showed PA systolic pressure 85 mmHg.  She says that she was told that the pulmonary hypertension was due to OSA.  She has been compliant with CPAP. She reports exertional dyspnea for about 4 years now.   In 8/21, she was admitted to [facility] with CHF.  Echo showed EF 55-60%, mild LVH, D-shaped septum, severe RV dilation with moderate RV systolic dysfunction, PASP 83, moderate TR, moderate PI, dilated IVC.  RHC/LHC showed no coronary disease, severe pulmonary hypertension with PVR 7.38 and preserved cardiac output.  High resolution CT chest in 9/21 showed no evidence for fibrotic ILD.    She saw rheumatology and was given no definite rheumatological diagnosis.    Echo in 7/22 showed EF 65-70% with moderate LVH, RV moderately dilated with moderately decreased RV systolic function, mild RVH, D-shaped septum, PASP 85 mmHg, moderate PR, normal IVC.  Echo in 9/23 showed EF 55-60%, D-shaped septum, severe RV dilation with severe RV dysfunction, mild TR, PASP 50 mmHg.   She is off Uptravi, unable to tolerate even the lowest dose due to diarrhea and headache.  She tried Tyvaso DPI; she developed headaches and did not like inhaling and found the device hard to manipulate so she stopped it.  She is not interested in trying the inhaler form of Tyvaso.  At a prior appointment, I offered her a trial of orenitram but she decided not to take it given the side effects she had with Uptravi.    Patient returns for followup of pulmonary hypertension/RV failure.  At last appointment, she was doing well.  However, today she seems much worse.  Weight is up 12 lbs.  She cut back her Lasix to 20 mg daily from 40 mg daily because the bottle had  20 mg daily written on it. She eats a microwave meal for lunch and again for dinner, diet seems quite high in sodium.  Does not cook since her husband passed away.  She had gastroenteritis with nausea and diarrhea about 3 wks ago but this has mostly resolved.  She is very fatigued.  She is short of breath and fatigued just walking around the house. She came in in a wheelchair today.  Ankles are swollen.  Rare lightheadedness with standing.  +Orthopnea.  Rare atypical chest pain.   ECG (personally reviewed): NSR, RBBB  Labs (8/21): K 3.3, creatinine 1.39, ANA negative, anti-Sm negative, anti-SCL-70 negative, ANCA negative, RF elevated 52 but CCP negative, ESR 67, CRP 2.2 Labs (9/21): K 4.5, creatinine 1.8 => 1.45, anti-centromere Ab negative, BNP 309 Labs (11/21): K 4.1, creatinine 1.47, BNP 590 Labs (1/22): LDL 55, K 3.8, creatinine 1.49 Labs (5/22): LDL 74, K 4, creatinine 1.5 Labs (9/22): K 3.8, creatinine 1.43 Labs (12/22): LDL 54, K 3.8, creatinine 1.3 Labs (2/23): BNP 197, K 3.4, creatinine 1.3 Labs (6/23): BNP 862 Labs (9/23): K 4, creatinine 1.87 Labs (2/24): K 3.9, creatinine 1.39, LFTs normal  6 minute walk (8/21): 154 m 6 minute walk (11/21): 183 m 6 minute walk (3/22): 351 m 6 minute walk (11/22): 381 m 6 minute walk (2/23): 290 m (off Uptravi)  PMH: 1. HTN 2. Type 2 diabetes 3. OSA: Uses CPAP.  4. GERD 5.  Hypothyroidism 6. Pulmonary hypertension/RV failure: Pulmonary hypertension diagnosed 2018 by echo.  - PFTs (6/18): moderate restriction - Echo 2020 with PASP 85 mmHg.  - Echo (8/21): EF 55-60%, mild LVH, D-shaped septum, severe RV dilation with moderate RV systolic dysfunction, PASP 83, moderate TR, moderate PI, dilated IVC.  - LHC/RHC (8/21): Normal coronaries; mean RA 3, PA 82/21 mean 44, mean PCWP 14, CI 2.2, PVR 7.38 WU.  - V/Q scan (8/21): No evidence for chronic PE.  - Serologic workup: ANA negative, anti-Sm negative, anti-SCL-70 negative, ANCA negative, RF  elevated 52 but CCP negative, ESR 67, CRP 2.2, anti-centromere negative - High resolution CT chest: No fibrotic ILD.  - Echo (7/22): EF 65-70% with moderate LVH, RV moderately dilated with moderately decreased RV systolic function, mild RVH, D-shaped septum, PASP 85 mmHg, moderate PR, normal IVC.  - Echo (9/23): EF 55-60%, D-shaped septum, severe RV dilation with severe RV dysfunction, mild TR, PASP 50 mmHg. 7. CKD stage 3.  8. Cirrhosis: Noted on 9/23 CT abdomen/pelvis. Suspect cardiogenic.   Social History   Socioeconomic History   Marital status: Widowed    Spouse name: [name]   Number of children: 3   Years of education: 14   Highest education level: Not on file  Occupational History    Comment: work part time, day care   Occupation: Retired  Tobacco Use   Smoking status: Never   Smokeless tobacco: Never  Vaping Use   Vaping Use: Never used  Substance and Sexual Activity   Alcohol use: No    Alcohol/week: 0.0 standard drinks of alcohol   Drug use: No   Sexual activity: Not Currently    Birth control/protection: None, Post-menopausal  Other Topics Concern   Not on file  Social History Narrative   Caffeine use- coffee 2 cups daily, Coke once a week, green tea daily   Social Determinants of Health   Financial Resource Strain: Not on file  Food Insecurity: Not on file  Transportation Needs: Not on file  Physical Activity: Not on file  Stress: Not on file  Social Connections: Not on file  Intimate Partner Violence: Not on file   Family History  Problem Relation Age of Onset   Diabetes Mother    Hypertension Mother    Stroke Mother    Heart disease Mother        MI   Cancer Father        pancreatic   Stroke Father    Hypertension Sister    Diabetes Brother    Diabetes Maternal Grandfather    Diabetes Sister    Diabetes Sister    Diabetes Brother    Diabetes Brother    ROS: All systems reviewed and negative except as per HPI.   Current Outpatient  Medications  Medication Sig Dispense Refill   ACCU-CHEK GUIDE test strip 3 each by Other route 3 (three) times daily as needed for other (low blood sugar symptoms). 100 each 6   ambrisentan (LETAIRIS) 10 MG tablet TAKE 1 TABLET BY MOUTH EVERY DAY 30 tablet 3   amLODipine (NORVASC) 10 MG tablet Take 1 tablet (10 mg total) by mouth daily. 90 tablet 3   aspirin 81 MG tablet Take 81 mg by mouth daily.     Cholecalciferol (VITAMIN D3) 125 MCG (5000 UT) CAPS Take 1 capsule by mouth daily.     clonazePAM (KLONOPIN) 1 MG tablet Take 1 mg by mouth 2 (two) times daily as needed.     dapagliflozin  propanediol (FARXIGA) 10 MG TABS tablet Take 1 tablet (10 mg total) by mouth daily before breakfast. 90 tablet 3   escitalopram (LEXAPRO) 10 MG tablet Take 10 mg by mouth daily.     esomeprazole (NEXIUM) 40 MG capsule Take 40 mg by mouth daily at 12 noon.     Fe Fum-FA-B Cmp-C-Zn-Mg-Mn-Cu (HEMOCYTE PLUS) 106-1 MG CAPS Take 1 capsule by mouth daily.  6   [START ON 08/29/2022] furosemide (LASIX) 20 MG tablet Take 2 tablets (40 mg total) by mouth in the morning AND 1 tablet (20 mg total) every evening. 90 tablet 3   hydrALAZINE (APRESOLINE) 50 MG tablet Take 50 mg by mouth 2 (two) times daily.     insulin aspart (NOVOLOG) 100 UNIT/ML injection Inject 4 Units into the skin. Bs greater than 200     Insulin Degludec (TRESIBA [place]) Inject 16 Units into the skin daily in the afternoon.     Insulin Pen Needle (B-D UF III MINI PEN NEEDLES) 31G X 5 MM MISC 4 (four) times daily     levothyroxine (SYNTHROID) 75 MCG tablet TAKE 1 TABLET BY MOUTH EVERY MORNING ON AN EMPTY STOMACH 90 tablet 1   loratadine (CLARITIN) 10 MG tablet Take 10 mg by mouth daily.      meclizine (ANTIVERT) 25 MG tablet Take 25 mg by mouth 3 (three) times daily as needed for dizziness.      metoprolol succinate (TOPROL-XL) 50 MG 24 hr tablet Take 50 mg by mouth daily. Take with or immediately following a meal.     Multiple Vitamins-Minerals (MULTI ADULT  GUMMIES) CHEW Chew by mouth daily.     potassium chloride SA (KLOR-CON M) 20 MEQ tablet Take 1 tablet (20 mEq total) by mouth daily. 30 tablet 3   rosuvastatin (CRESTOR) 10 MG tablet Take 10 mg by mouth daily.     tadalafil, PAH, (ADCIRCA) 20 MG tablet Take 2 tablets (40 mg total) by mouth daily. 60 tablet 11   No current facility-administered medications for this encounter.   BP 118/70   Pulse (!) 54   Wt 79.2 kg (174 lb 9.6 oz)   LMP  (LMP Unknown)   SpO2 100%   BMI 27.35 kg/m  General: NAD Neck: JVP 12-14 cm, no thyromegaly or thyroid nodule.  Lungs: Clear to auscultation bilaterally with normal respiratory effort. CV: Nondisplaced PMI.  Heart regular S1/S2, no S3/S4, no murmur.  1+ edema 1/2 to knees.  No carotid bruit.  Normal pedal pulses.  Abdomen: Soft, nontender, no hepatosplenomegaly, mild-moderate distention.  Skin: Intact without lesions or rashes.  Neurologic: Alert and oriented x 3.  Psych: Normal affect. Extremities: No clubbing or cyanosis.  HEENT: Normal.   Assessment/Plan: 1. HTN: BP controlled.  - Continue amlodipine.  2. OSA: Continue CPAP.    3. Pulmonary HTN/RV failure: Echo in 8/21 with EF 55-60%, mild LVH, D-shaped septum, severe RV dilation with moderate RV systolic dysfunction, PASP 83, moderate TR, moderate PI, dilated IVC.  Suspect long-standing RV dysfunction based on appearance of echo. [place] in 8/21 with severe pulmonary arterial hypertension, preserved cardiac output. PFTs from 6/18 showed moderate restriction.  V/Q scan from 8/21 showed no evidence for chronic PE. She has treated OSA.  Serologic workup was negative except for elevated CRP and ESR and elevated RF (normal CCP).  High resolution CT chest did not show evidence for ILD. I suspect long-standing group 1 PAH. Repeat echo in 7/22 showed EF 65-70% with moderate LVH, RV moderately dilated with moderately  decreased RV systolic function, mild RVH, D-shaped septum, PASP 85 mmHg, moderate PR, normal IVC.  Though echo still showed significant RV dysfunction, she symptomatically has felt better on PH meds.  She had to stop Uptravi due to intractable diarrhea and headache.  She was unable to use Tyvaso.  Echo in 9/23 showed EF 55-60%, D-shaped septum, severe RV dilation with severe RV dysfunction, mild TR, PASP 50 mmHg.  She is doing worse today with 12 lb weight gain and significant volume overload. She is only taking Lasix at 20 mg daily rather than 40 mg daily and following a high sodium diet.  - Defer 6 minute walk as she feels terrible.    - Increase Lasix to 40 mg bid x 4 days then 40 qam/20 qpm.  Increase KCl to 20 daily.  BMET/BNP today and BMET in 10 days.  - Continue Farxiga 10 mg daily.  - Continue ambrisentan 10.  - Continue tadalafil 40 mg daily.   - Unable to tolerate Uptravi but when she was on it, she had much better 6 minute walk and improved breathing. She could not handle Tyvaso DPI and does not want to try to the Tyvaso inhaler. She is willing to try orenitram.  I will arrange for her to start at a low dose and titrate up AFTER we improve her volume status/get her weight back to baseline.  We will see how she is doing at followup before initiating.  - Continue CPAP (OSA likely contributes but certainly cannot explain the extent of her [place]).  4. Pulmonary insufficiency: Moderate on 9/23 echo.   5. Cirrhosis: Noted on imaging.  Likely due  to RV failure and passive congestion, possible NAFLD.   Followup with APP in 3 wks.  Can start orenitram if she looks near euvolemic at that time.   Loralie Champagne 08/24/2022

## 2022-08-26 ENCOUNTER — Emergency Department: Payer: Medicare PPO

## 2022-08-26 ENCOUNTER — Inpatient Hospital Stay
Admission: EM | Admit: 2022-08-26 | Discharge: 2022-08-28 | DRG: 291 | Disposition: A | Discharge status: Home / Self Care | Payer: Medicare PPO | Attending: Internal Medicine | Admitting: Internal Medicine

## 2022-08-26 ENCOUNTER — Telehealth (HOSPITAL_COMMUNITY): Payer: Self-pay

## 2022-08-26 ENCOUNTER — Telehealth: Payer: Self-pay

## 2022-08-26 DIAGNOSIS — I5A Non-ischemic myocardial injury (non-traumatic): Secondary | ICD-10-CM | POA: Diagnosis not present

## 2022-08-26 DIAGNOSIS — I5032 Chronic diastolic (congestive) heart failure: Secondary | ICD-10-CM | POA: Diagnosis present

## 2022-08-26 DIAGNOSIS — R0602 Shortness of breath: Secondary | ICD-10-CM | POA: Diagnosis not present

## 2022-08-26 DIAGNOSIS — E785 Hyperlipidemia, unspecified: Secondary | ICD-10-CM | POA: Diagnosis present

## 2022-08-26 DIAGNOSIS — K219 Gastro-esophageal reflux disease without esophagitis: Secondary | ICD-10-CM | POA: Diagnosis present

## 2022-08-26 DIAGNOSIS — I10 Essential (primary) hypertension: Secondary | ICD-10-CM | POA: Diagnosis present

## 2022-08-26 DIAGNOSIS — Z79890 Hormone replacement therapy (postmenopausal): Secondary | ICD-10-CM

## 2022-08-26 DIAGNOSIS — Z823 Family history of stroke: Secondary | ICD-10-CM

## 2022-08-26 DIAGNOSIS — R197 Diarrhea, unspecified: Secondary | ICD-10-CM | POA: Diagnosis present

## 2022-08-26 DIAGNOSIS — Z8673 Personal history of transient ischemic attack (TIA), and cerebral infarction without residual deficits: Secondary | ICD-10-CM

## 2022-08-26 DIAGNOSIS — Z8249 Family history of ischemic heart disease and other diseases of the circulatory system: Secondary | ICD-10-CM | POA: Diagnosis not present

## 2022-08-26 DIAGNOSIS — F419 Anxiety disorder, unspecified: Secondary | ICD-10-CM | POA: Diagnosis present

## 2022-08-26 DIAGNOSIS — Z794 Long term (current) use of insulin: Secondary | ICD-10-CM

## 2022-08-26 DIAGNOSIS — Z7982 Long term (current) use of aspirin: Secondary | ICD-10-CM

## 2022-08-26 DIAGNOSIS — I5033 Acute on chronic diastolic (congestive) heart failure: Secondary | ICD-10-CM | POA: Diagnosis not present

## 2022-08-26 DIAGNOSIS — M810 Age-related osteoporosis without current pathological fracture: Secondary | ICD-10-CM | POA: Diagnosis present

## 2022-08-26 DIAGNOSIS — E1129 Type 2 diabetes mellitus with other diabetic kidney complication: Secondary | ICD-10-CM | POA: Diagnosis present

## 2022-08-26 DIAGNOSIS — J449 Chronic obstructive pulmonary disease, unspecified: Secondary | ICD-10-CM | POA: Diagnosis present

## 2022-08-26 DIAGNOSIS — R0689 Other abnormalities of breathing: Secondary | ICD-10-CM | POA: Diagnosis not present

## 2022-08-26 DIAGNOSIS — I270 Primary pulmonary hypertension: Secondary | ICD-10-CM | POA: Diagnosis present

## 2022-08-26 DIAGNOSIS — N1832 Chronic kidney disease, stage 3b: Secondary | ICD-10-CM | POA: Diagnosis present

## 2022-08-26 DIAGNOSIS — F32A Depression, unspecified: Secondary | ICD-10-CM | POA: Diagnosis not present

## 2022-08-26 DIAGNOSIS — E1122 Type 2 diabetes mellitus with diabetic chronic kidney disease: Secondary | ICD-10-CM | POA: Diagnosis not present

## 2022-08-26 DIAGNOSIS — I5031 Acute diastolic (congestive) heart failure: Secondary | ICD-10-CM | POA: Diagnosis not present

## 2022-08-26 DIAGNOSIS — G4700 Insomnia, unspecified: Secondary | ICD-10-CM | POA: Diagnosis present

## 2022-08-26 DIAGNOSIS — J849 Interstitial pulmonary disease, unspecified: Secondary | ICD-10-CM | POA: Diagnosis not present

## 2022-08-26 DIAGNOSIS — I50812 Chronic right heart failure: Secondary | ICD-10-CM | POA: Diagnosis not present

## 2022-08-26 DIAGNOSIS — Z79899 Other long term (current) drug therapy: Secondary | ICD-10-CM

## 2022-08-26 DIAGNOSIS — R0902 Hypoxemia: Secondary | ICD-10-CM | POA: Diagnosis not present

## 2022-08-26 DIAGNOSIS — I5082 Biventricular heart failure: Secondary | ICD-10-CM | POA: Diagnosis present

## 2022-08-26 DIAGNOSIS — I110 Hypertensive heart disease with heart failure: Secondary | ICD-10-CM | POA: Diagnosis not present

## 2022-08-26 DIAGNOSIS — I509 Heart failure, unspecified: Principal | ICD-10-CM

## 2022-08-26 DIAGNOSIS — Z9861 Coronary angioplasty status: Secondary | ICD-10-CM

## 2022-08-26 DIAGNOSIS — Z888 Allergy status to other drugs, medicaments and biological substances status: Secondary | ICD-10-CM

## 2022-08-26 DIAGNOSIS — I130 Hypertensive heart and chronic kidney disease with heart failure and stage 1 through stage 4 chronic kidney disease, or unspecified chronic kidney disease: Secondary | ICD-10-CM | POA: Diagnosis not present

## 2022-08-26 DIAGNOSIS — E039 Hypothyroidism, unspecified: Secondary | ICD-10-CM | POA: Diagnosis present

## 2022-08-26 DIAGNOSIS — Z833 Family history of diabetes mellitus: Secondary | ICD-10-CM | POA: Diagnosis not present

## 2022-08-26 DIAGNOSIS — Z9851 Tubal ligation status: Secondary | ICD-10-CM

## 2022-08-26 DIAGNOSIS — R778 Other specified abnormalities of plasma proteins: Secondary | ICD-10-CM | POA: Diagnosis not present

## 2022-08-26 DIAGNOSIS — I50810 Right heart failure, unspecified: Secondary | ICD-10-CM | POA: Diagnosis present

## 2022-08-26 DIAGNOSIS — R001 Bradycardia, unspecified: Secondary | ICD-10-CM | POA: Diagnosis not present

## 2022-08-26 DIAGNOSIS — Z90710 Acquired absence of both cervix and uterus: Secondary | ICD-10-CM

## 2022-08-26 HISTORY — DX: Personal history of transient ischemic attack (TIA), and cerebral infarction without residual deficits: Z86.73

## 2022-08-26 LAB — CBC WITH DIFFERENTIAL/PLATELET
Abs Immature Granulocytes: 0.01 10*3/uL (ref 0.00–0.07)
Basophils Absolute: 0 10*3/uL (ref 0.0–0.1)
Basophils Relative: 1 %
Eosinophils Absolute: 0.1 10*3/uL (ref 0.0–0.5)
Eosinophils Relative: 1 %
HCT: 40.1 % (ref 36.0–46.0)
Hemoglobin: 12.1 g/dL (ref 12.0–15.0)
Immature Granulocytes: 0 %
Lymphocytes Relative: 38 %
Lymphs Abs: 1.6 10*3/uL (ref 0.7–4.0)
MCH: 28.4 pg (ref 26.0–34.0)
MCHC: 30.2 g/dL (ref 30.0–36.0)
MCV: 94.1 fL (ref 80.0–100.0)
Monocytes Absolute: 0.3 10*3/uL (ref 0.1–1.0)
Monocytes Relative: 6 %
Neutro Abs: 2.3 10*3/uL (ref 1.7–7.7)
Neutrophils Relative %: 54 %
Platelets: 173 10*3/uL (ref 150–400)
RBC: 4.26 MIL/uL (ref 3.87–5.11)
RDW: 16.8 % — ABNORMAL HIGH (ref 11.5–15.5)
WBC: 4.3 10*3/uL (ref 4.0–10.5)
nRBC: 0 % (ref 0.0–0.2)

## 2022-08-26 LAB — COMPREHENSIVE METABOLIC PANEL
ALT: 21 U/L (ref 0–44)
AST: 27 U/L (ref 15–41)
Albumin: 3.7 g/dL (ref 3.5–5.0)
Alkaline Phosphatase: 70 U/L (ref 38–126)
Anion gap: 12 (ref 5–15)
BUN: 32 mg/dL — ABNORMAL HIGH (ref 8–23)
CO2: 22 mmol/L (ref 22–32)
Calcium: 9 mg/dL (ref 8.9–10.3)
Chloride: 106 mmol/L (ref 98–111)
Creatinine, Ser: 1.82 mg/dL — ABNORMAL HIGH (ref 0.44–1.00)
GFR, Estimated: 29 mL/min — ABNORMAL LOW (ref >60)
Glucose, Bld: 115 mg/dL — ABNORMAL HIGH (ref 70–99)
Potassium: 3.8 mmol/L (ref 3.5–5.1)
Sodium: 140 mmol/L (ref 135–145)
Total Bilirubin: 1 mg/dL (ref 0.3–1.2)
Total Protein: 6.8 g/dL (ref 6.5–8.1)

## 2022-08-26 LAB — TROPONIN I (HIGH SENSITIVITY)
Troponin I (High Sensitivity): 26 ng/L — ABNORMAL HIGH (ref <18)
Troponin I (High Sensitivity): 25 ng/L — ABNORMAL HIGH (ref <18)

## 2022-08-26 LAB — CBG MONITORING, ED
Glucose-Capillary: 139 mg/dL — ABNORMAL HIGH (ref 70–99)
Glucose-Capillary: 75 mg/dL (ref 70–99)

## 2022-08-26 MED ORDER — ASPIRIN 81 MG PO CHEW
81.0000 mg | CHEWABLE_TABLET | Freq: Every day | ORAL | Status: DC
  Administered 2022-08-26 – 2022-08-28 (×3): 81 mg via ORAL
  Filled 2022-08-26 (×3): qty 1

## 2022-08-26 MED ORDER — LEVOTHYROXINE SODIUM 50 MCG PO TABS
75.0000 ug | ORAL_TABLET | Freq: Every morning | ORAL | Status: DC
  Administered 2022-08-27 – 2022-08-28 (×2): 75 ug via ORAL
  Filled 2022-08-26 (×2): qty 1

## 2022-08-26 MED ORDER — HYDRALAZINE HCL 50 MG PO TABS
50.0000 mg | ORAL_TABLET | Freq: Two times a day (BID) | ORAL | Status: DC
  Administered 2022-08-26 – 2022-08-28 (×4): 50 mg via ORAL
  Filled 2022-08-26 (×4): qty 1

## 2022-08-26 MED ORDER — METOPROLOL SUCCINATE ER 50 MG PO TB24
50.0000 mg | ORAL_TABLET | Freq: Every day | ORAL | Status: DC
  Administered 2022-08-26 – 2022-08-28 (×3): 50 mg via ORAL
  Filled 2022-08-26 (×3): qty 1

## 2022-08-26 MED ORDER — VITAMIN D 25 MCG (1000 UNIT) PO TABS
1000.0000 [IU] | ORAL_TABLET | Freq: Every day | ORAL | Status: DC
  Administered 2022-08-26 – 2022-08-28 (×3): 1000 [IU] via ORAL
  Filled 2022-08-26 (×3): qty 1

## 2022-08-26 MED ORDER — TADALAFIL (PAH) 20 MG PO TABS
40.0000 mg | ORAL_TABLET | Freq: Every day | ORAL | Status: DC
  Administered 2022-08-26 – 2022-08-28 (×3): 40 mg via ORAL
  Filled 2022-08-26 (×3): qty 2

## 2022-08-26 MED ORDER — AMLODIPINE BESYLATE 10 MG PO TABS
10.0000 mg | ORAL_TABLET | Freq: Every day | ORAL | Status: DC
  Administered 2022-08-26 – 2022-08-28 (×3): 10 mg via ORAL
  Filled 2022-08-26: qty 2
  Filled 2022-08-26 (×2): qty 1

## 2022-08-26 MED ORDER — CLONAZEPAM 1 MG PO TABS
1.0000 mg | ORAL_TABLET | Freq: Two times a day (BID) | ORAL | Status: DC | PRN
  Administered 2022-08-26 – 2022-08-27 (×2): 1 mg via ORAL
  Filled 2022-08-26: qty 2
  Filled 2022-08-26: qty 1

## 2022-08-26 MED ORDER — LORATADINE 10 MG PO TABS
5.0000 mg | ORAL_TABLET | Freq: Every day | ORAL | Status: DC
  Administered 2022-08-26 – 2022-08-28 (×3): 5 mg via ORAL
  Filled 2022-08-26 (×3): qty 1

## 2022-08-26 MED ORDER — MULTI ADULT GUMMIES PO CHEW: CHEWABLE_TABLET | Freq: Every day | ORAL | Status: DC

## 2022-08-26 MED ORDER — FUROSEMIDE 10 MG/ML IJ SOLN
80.0000 mg | Freq: Once | INTRAMUSCULAR | Status: AC
  Administered 2022-08-26: 80 mg via INTRAVENOUS
  Filled 2022-08-26: qty 8

## 2022-08-26 MED ORDER — ACETAMINOPHEN 325 MG PO TABS: 650.0000 mg | ORAL_TABLET | Freq: Four times a day (QID) | ORAL | Status: DC | PRN

## 2022-08-26 MED ORDER — INSULIN ASPART 100 UNIT/ML IJ SOLN
0.0000 [IU] | Freq: Every day | INTRAMUSCULAR | Status: DC
  Administered 2022-08-26: 0 [IU] via SUBCUTANEOUS

## 2022-08-26 MED ORDER — MECLIZINE HCL 25 MG PO TABS: 25.0000 mg | ORAL_TABLET | Freq: Three times a day (TID) | ORAL | Status: DC | PRN

## 2022-08-26 MED ORDER — FUROSEMIDE 10 MG/ML IJ SOLN
40.0000 mg | Freq: Two times a day (BID) | INTRAMUSCULAR | Status: DC
  Administered 2022-08-26 – 2022-08-28 (×4): 40 mg via INTRAVENOUS
  Filled 2022-08-26 (×4): qty 4

## 2022-08-26 MED ORDER — ALBUTEROL SULFATE (2.5 MG/3ML) 0.083% IN NEBU: 2.5000 mg | INHALATION_SOLUTION | RESPIRATORY_TRACT | Status: DC | PRN

## 2022-08-26 MED ORDER — DIPHENHYDRAMINE HCL 50 MG/ML IJ SOLN: 12.5000 mg | Freq: Three times a day (TID) | INTRAMUSCULAR | Status: DC | PRN

## 2022-08-26 MED ORDER — NEPHRO-VITE 0.8 MG PO TABS
1.0000 | ORAL_TABLET | Freq: Every day | ORAL | Status: DC
  Administered 2022-08-26 – 2022-08-28 (×3): 1 via ORAL
  Filled 2022-08-26 (×3): qty 1

## 2022-08-26 MED ORDER — HYDRALAZINE HCL 20 MG/ML IJ SOLN: 5.0000 mg | INTRAMUSCULAR | Status: DC | PRN

## 2022-08-26 MED ORDER — INSULIN GLARGINE-YFGN 100 UNIT/ML ~~LOC~~ SOLN
10.0000 [IU] | Freq: Every day | SUBCUTANEOUS | Status: DC
  Administered 2022-08-27: 10 [IU] via SUBCUTANEOUS
  Filled 2022-08-26 (×3): qty 0.1

## 2022-08-26 MED ORDER — ROSUVASTATIN CALCIUM 10 MG PO TABS
10.0000 mg | ORAL_TABLET | Freq: Every day | ORAL | Status: DC
  Administered 2022-08-26 – 2022-08-27 (×2): 10 mg via ORAL
  Filled 2022-08-26 (×2): qty 1

## 2022-08-26 MED ORDER — PANTOPRAZOLE SODIUM 40 MG PO TBEC
40.0000 mg | DELAYED_RELEASE_TABLET | Freq: Every day | ORAL | Status: DC
  Administered 2022-08-26 – 2022-08-28 (×3): 40 mg via ORAL
  Filled 2022-08-26 (×3): qty 1

## 2022-08-26 MED ORDER — DM-GUAIFENESIN ER 30-600 MG PO TB12: 1.0000 | ORAL_TABLET | Freq: Two times a day (BID) | ORAL | Status: DC | PRN

## 2022-08-26 MED ORDER — ESCITALOPRAM OXALATE 10 MG PO TABS
10.0000 mg | ORAL_TABLET | Freq: Every day | ORAL | Status: DC
  Administered 2022-08-26 – 2022-08-28 (×3): 10 mg via ORAL
  Filled 2022-08-26 (×3): qty 1

## 2022-08-26 MED ORDER — INSULIN ASPART 100 UNIT/ML IJ SOLN
0.0000 [IU] | Freq: Three times a day (TID) | INTRAMUSCULAR | Status: DC
  Administered 2022-08-27 (×2): 2 [IU] via SUBCUTANEOUS
  Filled 2022-08-26 (×2): qty 1

## 2022-08-26 MED ORDER — AMBRISENTAN 5 MG PO TABS
10.0000 mg | ORAL_TABLET | Freq: Every day | ORAL | Status: DC
  Administered 2022-08-26 – 2022-08-28 (×3): 10 mg via ORAL
  Filled 2022-08-26 (×3): qty 2

## 2022-08-26 MED ORDER — ENOXAPARIN SODIUM 30 MG/0.3ML IJ SOSY
30.0000 mg | PREFILLED_SYRINGE | INTRAMUSCULAR | Status: DC
  Administered 2022-08-26 – 2022-08-27 (×2): 30 mg via SUBCUTANEOUS
  Filled 2022-08-26 (×2): qty 0.3

## 2022-08-26 NOTE — H&P (Signed)
History and Physical    Jamie Murray [id number] DOB: 26-Apr-1946 DOA: 08/26/2022  Referring MD/NP/PA:   PCP: [name], FNP   Patient coming from:  The patient is coming from home.   Chief Complaint: Shortness of breath  HPI: Jamie Murray is a 77 y.o. female with medical history significant of right heart failure, diastolic CHF, pulmonary hypertension, hypertension, hyperlipidemia, diabetes mellitus, COPD, stroke, hypothyroidism, depression with anxiety, CKD-3B, interstitial lung disease, who presents with shortness oif breath.  Patient states that she has progressively worsening shortness breath for more than 3 weeks.  She has worsening leg edema, 12 pounds of weight gain and orthopnea.  Does not have chest pain, cough, fever or chills.  Patient has diarrhea for almost 3 weeks, 3-4 times of watery diarrhea each day.  No nausea, vomiting or abdominal pain.  No symptoms of UTI.  Patient states that his cardiologist, Dr. [name] increased her Lasix dose to 40 mg twice daily, but no significant improvement of her shortness breath.  Dy[place] reviewed independently and ED Course: pt was found to have BNP 2366, WBC 4.3, troponin level 26 --> 25, slightly worsening renal function, temperature normal, blood pressure 123/93, heart rate 57, RR 24, oxygen saturation 99% on room air.  Chest x-ray showed cardiomegaly and a tiny right pleural effusion.  Patient is admitted to telemetry bed as inpatient.       EKG: I have personally reviewed.  Sinus rhythm, QTc 522, right bundle blockade, low voltage, early R wave progression   Review of Systems:   General: no fevers, chills, no body weight gain, has fatigue HEENT: no blurry vision, hearing changes or sore throat Respiratory: has dyspnea, no coughing, wheezing CV: no chest pain, no palpitations GI: no nausea, vomiting, abdominal pain, has diarrhea GU: no dysuria, burning on urination, increased urinary frequency, hematuria   Ext: has leg edema Neuro: no unilateral weakness, numbness, or tingling, no vision change or hearing loss Skin: no rash, no skin tear. MSK: No muscle spasm, no deformity, no limitation of range of movement in spin Heme: No easy bruising.  Travel history: No recent long distant travel.   Allergy:  Allergies  Allergen Reactions   Atorvastatin     Note: joint pain   Fluoxetine Other (See Comments)    "felt crazy"   Levemir [Insulin Detemir] Other (See Comments)    Bruising   Prozac [Fluoxetine Hcl] Other (See Comments)    "felt crazy"    Past Medical History:  Diagnosis Date   Allergy    Anemia    Anxiety    Chest pain, rule out acute myocardial infarction 08/09/2016   COPD (chronic obstructive pulmonary disease) ([place])    Depression    Diabetes ([place])    Diarrhea, functional 01/25/2016   Edema of lower extremity 02/03/2020   Elevated troponin level 01/09/2020   GERD (gastroesophageal reflux disease)    Hyperlipidemia    Hypertension    Hypokalemia 12/22/2015   Hypomagnesemia 03/22/2016   Insomnia    Macrocytosis 01/25/2016   Migraines    Osteoporosis    [place] spotted fever    Transient cerebral ischemia    Vertigo    every 2-3 months    Past Surgical History:  Procedure Laterality Date   ABDOMINAL HYSTERECTOMY     CORONARY ANGIOPLASTY     ESOPHAGOGASTRODUODENOSCOPY (EGD) WITH PROPOFOL N/A 08/24/2015   Procedure: ESOPHAGOGASTRODUODENOSCOPY (EGD) WITH PROPOFOL with dialation;  Surgeon: Lucilla Lame, MD;  Location: [facility];  Service: Endoscopy;  Laterality: N/A;  Diabetic - oral meds   RIGHT/LEFT HEART CATH AND CORONARY/GRAFT ANGIOGRAPHY N/A 01/13/2020   Procedure: RIGHT/LEFT HEART CATH AND CORONARY/GRAFT ANGIOGRAPHY;  Surgeon: Wellington Hampshire, MD;  Location: [facility];  Service: Cardiovascular;  Laterality: N/A;   TUBAL LIGATION      Social History:  reports that she has never smoked. She has never used smokeless tobacco. She  reports that she does not drink alcohol and does not use drugs.  Family History:  Family History  Problem Relation Age of Onset   Diabetes Mother    Hypertension Mother    Stroke Mother    Heart disease Mother        MI   Cancer Father        pancreatic   Stroke Father    Hypertension Sister    Diabetes Brother    Diabetes Maternal Grandfather    Diabetes Sister    Diabetes Sister    Diabetes Brother    Diabetes Brother      Prior to Admission medications   Medication Sig Start Date End Date Taking? Authorizing Provider  ACCU-CHEK GUIDE test strip 3 each by Other route 3 (three) times daily as needed for other (low blood sugar symptoms). 08/01/22   [name], FNP  ambrisentan (LETAIRIS) 10 MG tablet TAKE 1 TABLET BY MOUTH EVERY DAY 06/07/22   Larey Dresser, MD  amLODipine (NORVASC) 10 MG tablet Take 1 tablet (10 mg total) by mouth daily. 05/02/22   Larey Dresser, MD  aspirin 81 MG tablet Take 81 mg by mouth daily.    [provider]  Cholecalciferol (VITAMIN D3) 125 MCG (5000 UT) CAPS Take 1 capsule by mouth daily.    [provider]  clonazePAM (KLONOPIN) 1 MG tablet Take 1 mg by mouth 2 (two) times daily as needed.    [provider]  dapagliflozin propanediol (FARXIGA) 10 MG TABS tablet Take 1 tablet (10 mg total) by mouth daily before breakfast. 02/11/22   Larey Dresser, MD  escitalopram (LEXAPRO) 10 MG tablet Take 10 mg by mouth daily.    [provider]  esomeprazole (NEXIUM) 40 MG capsule Take 40 mg by mouth daily at 12 noon.    [provider]  Fe Fum-FA-B Cmp-C-Zn-Mg-Mn-Cu (HEMOCYTE PLUS) 106-1 MG CAPS Take 1 capsule by mouth daily. 03/23/18   [provider]  furosemide (LASIX) 20 MG tablet Take 2 tablets (40 mg total) by mouth in the morning AND 1 tablet (20 mg total) every evening. 08/29/22   Larey Dresser, MD  hydrALAZINE (APRESOLINE) 50 MG tablet Take 50 mg by mouth 2 (two) times daily.    [provider]  insulin aspart (NOVOLOG) 100 UNIT/ML injection Inject 4 Units into the skin. Bs greater than 200    [provider]  Insulin Degludec (TRESIBA [place]) Inject 16 Units into the skin daily in the afternoon.    [provider]  Insulin Pen Needle (B-D UF III MINI PEN NEEDLES) 31G X 5 MM MISC 4 (four) times daily 06/02/20   [provider]  levothyroxine (SYNTHROID) 75 MCG tablet TAKE 1 TABLET BY MOUTH EVERY MORNING ON AN EMPTY STOMACH 08/16/22   [name], FNP  loratadine (CLARITIN) 10 MG tablet Take 10 mg by mouth daily.     [provider]  meclizine (ANTIVERT) 25 MG tablet Take 25 mg by mouth 3 (three) times daily as needed for dizziness.  [provider]  metoprolol succinate (TOPROL-XL) 50 MG 24 hr tablet Take 50 mg by mouth daily. Take with or immediately following a meal.    [provider]  Multiple Vitamins-Minerals ([place]) CHEW Chew by mouth daily.    [provider]  potassium chloride SA (KLOR-CON M) 20 MEQ tablet Take 1 tablet (20 mEq total) by mouth daily. 08/24/22   Larey Dresser, MD  rosuvastatin (CRESTOR) 10 MG tablet Take 10 mg by mouth daily.    [provider]  tadalafil, PAH, (ADCIRCA) 20 MG tablet Take 2 tablets (40 mg total) by mouth daily. 06/22/22   Larey Dresser, MD    Physical Exam: Vitals:   08/26/22 1300 08/26/22 1510 08/26/22 1534 08/26/22 1800  BP: (!) 123/93     Pulse: (!) 57 66  67  Resp: 19   (!) 26  Temp:   98.7 F (37.1 C)   TempSrc:   Oral   SpO2: 98% 100%  98%  Weight:      Height:       General: Not in acute distress HEENT:       Eyes: PERRL, EOMI, no scleral icterus.       ENT: No discharge from the ears and nose, no pharynx injection, no tonsillar enlargement.        Neck: positive JVD, no bruit, no mass felt. Heme: No neck lymph node enlargement. Cardiac: S1/S2, RRR, No murmurs, No gallops or rubs. Respiratory: No rales, wheezing,  rhonchi or rubs. GI: Soft, nondistended, nontender, no rebound pain, no organomegaly, BS present. GU: No hematuria Ext: 1+ pitting leg edema bilaterally. 1+DP/PT pulse bilaterally. Musculoskeletal: No joint deformities, No joint redness or warmth, no limitation of ROM in spin. Skin: No rashes.  Neuro: Alert, oriented X3, cranial nerves II-XII grossly intact, moves all extremities normally.  Psych: Patient is not psychotic, no suicidal or hemocidal ideation.  Labs on Admission: I have personally reviewed following labs and imaging studies  CBC: Recent Labs  Lab 08/26/22 1105  WBC 4.3  NEUTROABS 2.3  HGB 12.1  HCT 40.1  MCV 94.1  PLT [id number]   Basic Metabolic Panel: Recent Labs  Lab 08/24/22 1111 08/26/22 1105  NA 143 140  K 4.1 3.8  CL 112* 106  CO2 22 22  GLUCOSE 146* 115*  BUN 33* 32*  CREATININE 1.83* 1.82*  CALCIUM 8.8* 9.0   GFR: Estimated Creatinine Clearance: 28.9 mL/min (A) (by C-G formula based on SCr of 1.82 mg/dL (H)). Liver Function Tests: Recent Labs  Lab 08/26/22 1105  AST 27  ALT 21  ALKPHOS 70  BILITOT 1.0  PROT 6.8  ALBUMIN 3.7   No results for input(s): "LIPASE", "AMYLASE" in the last 168 hours. No results for input(s): "AMMONIA" in the last 168 hours. Coagulation Profile: No results for input(s): "INR", "PROTIME" in the last 168 hours. Cardiac Enzymes: No results for input(s): "CKTOTAL", "CKMB", "CKMBINDEX", "TROPONINI" in the last 168 hours. BNP (last 3 results) No results for input(s): "PROBNP" in the last 8760 hours. HbA1C: No results for input(s): "HGBA1C" in the last 72 hours. CBG: Recent Labs  Lab 08/26/22 1627  GLUCAP 139*   Lipid Profile: No results for input(s): "CHOL", "HDL", "LDLCALC", "TRIG", "CHOLHDL", "LDLDIRECT" in the last 72 hours. Thyroid Function Tests: No results for input(s): "TSH", "T4TOTAL", "FREET4", "T3FREE", "THYROIDAB" in the last 72 hours. Anemia Panel: No results for input(s): "VITAMINB12", "FOLATE",  "FERRITIN", "TIBC", "IRON", "RETICCTPCT" in the last 72 hours. Urine analysis:  Component Value Date/Time   COLORURINE YELLOW (A) 02/07/2022 1435   APPEARANCEUR HAZY (A) 02/07/2022 1435   APPEARANCEUR Cloudy (A) 04/28/2015 1100   LABSPEC 1.031 (H) 02/07/2022 1435   LABSPEC 1.024 04/17/2012 1056   PHURINE 5.0 02/07/2022 1435   GLUCOSEU 50 (A) 02/07/2022 1435   GLUCOSEU Negative 04/17/2012 1056   HGBUR NEGATIVE 02/07/2022 1435   BILIRUBINUR NEGATIVE 02/07/2022 1435   BILIRUBINUR Negative 04/28/2015 1100   BILIRUBINUR Negative 04/17/2012 1056   KETONESUR NEGATIVE 02/07/2022 1435   PROTEINUR 100 (A) 02/07/2022 1435   NITRITE NEGATIVE 02/07/2022 1435   LEUKOCYTESUR NEGATIVE 02/07/2022 1435   LEUKOCYTESUR 1+ 04/17/2012 1056   Sepsis Labs: @LABRCNTIP (procalcitonin:4,lacticidven:4) )No results found for this or any previous visit (from the past 240 hour(s)).   Radiological Exams on Admission: DG Chest 2 View  Result Date: 08/26/2022 CLINICAL DATA:  Shortness of breath EXAM: CHEST - 2 VIEW COMPARISON:  01/10/2020 FINDINGS: Enlarged cardiopericardial silhouette. Calcified aorta. Film is rotated to the left. Slightly blunted right costophrenic angle. Tiny effusion versus pleural thickening. No consolidation, pneumothorax or edema. Overlapping cardiac leads. IMPRESSION: Enlarged heart. No edema. Tiny right effusion versus pleural thickening. Rotated radiograph. Electronically Signed   By: Jill Side M.D.   On: 08/26/2022 11:53      Assessment/Plan Principal Problem:   Acute on chronic diastolic CHF (congestive heart failure) (HCC) Active Problems:   Right heart failure ([place])   Primary pulmonary hypertension (HCC)   Interstitial pulmonary disease (HCC)   Hypertension   Hypothyroidism   Hyperlipidemia   Myocardial injury   Type II diabetes mellitus with renal manifestations (HCC)   Chronic kidney disease, stage 3b (HCC)   History of stroke   Diarrhea   Anxiety and  depression   Assessment and Plan:  Acute on chronic diastolic CHF (congestive heart failure) and right heart failure: 2D echo on 02/22/2022 showed EF of 50-60% with grade 1 diastolic dysfunction.  Patient has weight gain, leg edema, elevated BNP 2366, positive JVD, clinically consistent with CHF exacerbation.  -Will admit to tele as inpatient -Lasix 40 mg bid by IV (patient received 80 mg of Lasix in ED) -2d echo -Daily weights -strict I/O's -Low salt diet -Fluid restriction -Obtain REDs Vest reading  Primary pulmonary hypertension (HCC) -Continue tadalafil and Letairis  Interstitial pulmonary disease ([place]): -Bronchodilators  Hypertension -IV hydralazine as needed -Pn, oral hydralazine, metoprolol,  Hypothyroidism -Synthroid  Hyperlipidemia -Crestor  Myocardial injury: Troponin level 26 --> 25.  No chest pain -Continue aspirin, Crestor  Type II diabetes mellitus with renal manifestations [facility]): Recent A1c 6.3, well-controlled. -Sliding scale insulin -Glargine insulin 10 unit daily  Chronic kidney disease, stage 3b ([place]): Slightly worsening than baseline.  Recent baseline creatinine 1.3-1.7.  Her creatinine is 1.82, BUN 32, GFR 29. -Follow-up with BMP  History of stroke -Aspirin and Crestor  Diarrhea -Check C. difficile and GI pathogen panel  Anxiety and depression -Continue home medications     DVT ppx: SQ Lovenox  Code Status: Full code  Family Communication: Yes, patient's daughter by phone  Disposition Plan:  Anticipate discharge back to previous environment  Consults called:  none  Admission status and Level of care: Telemetry Cardiac:  as inpt      Dispo: The patient is from: Home              Anticipated d/c is to: Home              Anticipated d/c date is: 2 days  Patient currently is not medically stable to d/c.    Severity of Illness:  The appropriate patient status for this patient is INPATIENT. Inpatient status is  judged to be reasonable and necessary in order to provide the required intensity of service to ensure the patient's safety. The patient's presenting symptoms, physical exam findings, and initial radiographic and laboratory data in the context of their chronic comorbidities is felt to place them at high risk for further clinical deterioration. Furthermore, it is not anticipated that the patient will be medically stable for discharge from the hospital within 2 midnights of admission.   * I certify that at the point of admission it is my clinical judgment that the patient will require inpatient hospital care spanning beyond 2 midnights from the point of admission due to high intensity of service, high risk for further deterioration and high frequency of surveillance required.*       Date of Service 08/26/2022    Ivor Costa [facility]   If 7PM-7AM, please contact night-coverage [email or web address] 08/26/2022, 6:33 PM

## 2022-08-26 NOTE — ED Triage Notes (Signed)
Pt arrives via [facility] for DOE worsening over the last three weeks. Pt reports orthopnea and leg swelling as well. Pt currently taking 40 mg of Lasix BID.

## 2022-08-26 NOTE — ED Notes (Signed)
Pt assisted back from toilet- DOE noted.

## 2022-08-26 NOTE — Progress Notes (Signed)
Monitoring by Pharmacy for Pulmonary Hypertension Treatment   Indication - Continuation of prior to admission medication   Patient is 77 y.o.  with history of PAH on chronic ambrisentan (LETAIRIS) PTA and will be continued while hospitalized.   Continuing this medication order as an inpatient requires that monitoring parameters per REMS requirements must be met.  Chronic therapy is under the supervision of Dr [name] who is enrolled in the REMS program and is being notified of continuation of therapy. A staff message in EPIC has been sent notifying the certified prescriber.  Per patient report has previously been educated on Pregnancy risk and Hepatotoxicity. On admission pregnancy risk has been assessed and no monitoring required Hepatic function has been evaluated. AST / ALT appropriate to continue medication at this time.     Latest Ref Rng & Units 08/26/2022   11:05 AM 07/15/2022   12:29 PM 02/07/2022    2:35 PM  Hepatic Function  Total Protein 6.5 - 8.1 g/dL 6.8  6.6  7.0   Albumin 3.5 - 5.0 g/dL 3.7  4.2  3.7   AST 15 - 41 U/L 27  16  34   ALT 0 - 44 U/L 21  10  36   Alk Phosphatase 38 - 126 U/L 70  83  91   Total Bilirubin 0.3 - 1.2 mg/dL 1.0  0.2  1.2     If any question arise or pregnancy is identified during hospitalization, contact for bosentan: [phone]; macitentan: [phone]; ambrisentan: [phone].  Thank for you allowing [place] to participate in the care of this patient.  Darrick Penna 08/26/2022, 6:47 PM Clinical Pharmacist  Guides for Female Patient:  ambrisentan (LETAIRIS), macitentan (OPSUMIT), bosentan (TRACLEER).

## 2022-08-26 NOTE — ED Provider Notes (Signed)
[facility] Provider Note    Event Date/Time   First MD Initiated Contact with Patient 08/26/22 1107     (approximate)   History   Shortness of Breath   HPI  Jamie Murray is a 77 y.o. female with a history of hypertension, hyperlipidemia, type II diabetes, COPD, and pulmonary tension/RV failure who presents with worsening shortness of breath over the last several days.  The patient states she was seen by cardiology a few days ago and increased her Lasix but remains very short of breath with minimal exertion.  She denies any significant new leg swelling but states it is somewhat worse than it has been.  She denies any chest pain, cough, or fever.  I reviewed the past medical records.  The patient most recent outpatient encounter was Dr. [name] from cardiology on 3/20 for evaluation of heart failure.  At that time she reported increased weight and shortness of breath.  Her Lasix was increased to 40 mg twice daily and her other medications were continued.   Physical Exam   Triage Vital Signs: ED Triage Vitals  Enc Vitals Group     BP 08/26/22 1100 122/87     Pulse Rate 08/26/22 1100 (!) 53     Resp 08/26/22 1108 19     Temp 08/26/22 1108 97.6 F (36.4 C)     Temp Source 08/26/22 1108 Oral     SpO2 08/26/22 1100 95 %     Weight 08/26/22 1101 174 lb (78.9 kg)     Height 08/26/22 1101 5\' 7"  (1.702 m)     Head Circumference --      Peak Flow --      Pain Score 08/26/22 1100 0     Pain Loc --      Pain Edu? --      Excl. in [place]? --     Most recent vital signs: Vitals:   08/26/22 1510 08/26/22 1534  BP:    Pulse: 66   Resp:    Temp:  98.7 F (37.1 C)  SpO2: 100%      General: Awake, no distress.  CV:  Good peripheral perfusion.  Resp:  Increased respiratory effort.  No significant rales or wheezes. Abd:  No distention.  Other:  1+ bilateral lower extremity edema.   ED Results / Procedures / Treatments   Labs (all labs ordered  are listed, but only abnormal results are displayed) Labs Reviewed  CBC WITH DIFFERENTIAL/PLATELET - Abnormal; Notable for the following components:      Result Value   RDW 16.8 (*)    All other components within normal limits  COMPREHENSIVE METABOLIC PANEL - Abnormal; Notable for the following components:   Glucose, Bld 115 (*)    BUN 32 (*)    Creatinine, Ser 1.82 (*)    GFR, Estimated 29 (*)    All other components within normal limits  TROPONIN I (HIGH SENSITIVITY) - Abnormal; Notable for the following components:   Troponin I (High Sensitivity) 26 (*)    All other components within normal limits  TROPONIN I (HIGH SENSITIVITY) - Abnormal; Notable for the following components:   Troponin I (High Sensitivity) 25 (*)    All other components within normal limits     EKG  ED ECG REPORT I, Arta Silence, the attending physician, personally viewed and interpreted this ECG.  Date: 08/26/2022 EKG Time: 1105 Rate: 53 Rhythm: normal sinus rhythm QRS Axis: normal Intervals: RBBB B ST/T Wave  abnormalities: Nonspecific ST abnormalities Narrative Interpretation: no evidence of acute ischemia    RADIOLOGY  Chest x-ray: I independently viewed and interpreted the images; there is no significant edema or consolidation.   PROCEDURES:  Critical Care performed: No  Procedures   MEDICATIONS ORDERED IN ED: Medications  albuterol (PROVENTIL) (2.5 MG/3ML) 0.083% nebulizer solution 2.5 mg (has no administration in time range)  dextromethorphan-guaiFENesin (MUCINEX DM) 30-600 MG per 12 hr tablet 1 tablet (has no administration in time range)  hydrALAZINE (APRESOLINE) injection 5 mg (has no administration in time range)  acetaminophen (TYLENOL) tablet 650 mg (has no administration in time range)  diphenhydrAMINE (BENADRYL) injection 12.5 mg (has no administration in time range)  enoxaparin (LOVENOX) injection 30 mg (has no administration in time range)  insulin aspart (novoLOG)  injection 0-9 Units (has no administration in time range)  insulin aspart (novoLOG) injection 0-5 Units (has no administration in time range)  furosemide (LASIX) injection 80 mg (80 mg Intravenous Given 08/26/22 1234)     IMPRESSION / MDM / ASSESSMENT AND PLAN / ED COURSE  I reviewed the triage vital signs and the nursing notes.  77 year old female with PMH as noted above presents with worsening shortness of breath, edema, weight gain.  She recently increased her dose of Lasix with no relief.  Differential diagnosis includes, but is not limited to, CHF exacerbation, fluid overload due to renal or other etiology, ACS.  We will obtain chest x-ray, lab workup including cardiac enzymes, give IV Lasix, and reassess.  Patient's presentation is most consistent with acute presentation with potential threat to life or bodily function.  The patient is on the cardiac monitor to evaluate for evidence of arrhythmia and/or significant heart rate changes.  ----------------------------------------- 3:00 PM on 08/26/2022 -----------------------------------------  The patient states he is feeling slightly better after getting Lasix although it has only been a couple of hours.  On further discussion with her, given that she is having significant shortness of breath even with minimal exertion we will admit her for further IV diuresis.  I consulted Dr. Blaine Hamper from the hospitalist service; based on our discussion he agrees to admit the patient.  FINAL CLINICAL IMPRESSION(S) / ED DIAGNOSES   Final diagnoses:  Acute on chronic congestive heart failure, unspecified heart failure type ([place])     Rx / DC Orders   ED Discharge Orders     None        Note:  This document was prepared using Dragon voice recognition software and may include unintentional dictation errors.    Arta Silence, MD 08/26/22 [phone]

## 2022-08-26 NOTE — ED Notes (Addendum)
Patient transported to x-ray. ?

## 2022-08-26 NOTE — Telephone Encounter (Signed)
Patient aware of lab results. I explained to her the information from Dr. [name] about her fluid and symptoms to watch over the next few days. She will call us on Monday and let us know how she is feeling.

## 2022-08-26 NOTE — Patient Outreach (Signed)
  Care Coordination   08/26/2022 Name: Jamie Murray MRN: [id number] DOB: [birth year]   Care Coordination Outreach Attempts:  An unsuccessful telephone outreach was attempted today to offer the patient information about available care coordination services as a benefit of their health plan.  HIPAA compliant message left.   Follow Up Plan:  Additional outreach attempts will be made to offer the patient care coordination information and services.   Encounter Outcome:  No Answer   Care Coordination Interventions:  No, not indicated    Quinn Plowman [facility] [place] [phone] direct line

## 2022-08-27 ENCOUNTER — Other Ambulatory Visit: Payer: Self-pay

## 2022-08-27 ENCOUNTER — Inpatient Hospital Stay (HOSPITAL_COMMUNITY)
Admission: EM | Admit: 2022-08-27 | Discharge: 2022-08-27 | Disposition: A | Discharge status: Home / Self Care | Payer: Medicare PPO | Source: Home / Self Care | Attending: Internal Medicine | Admitting: Internal Medicine

## 2022-08-27 DIAGNOSIS — I5031 Acute diastolic (congestive) heart failure: Secondary | ICD-10-CM

## 2022-08-27 DIAGNOSIS — I5033 Acute on chronic diastolic (congestive) heart failure: Secondary | ICD-10-CM | POA: Diagnosis not present

## 2022-08-27 LAB — ECHOCARDIOGRAM COMPLETE
AR max vel: 1.59 cm2
AV Area VTI: 1.48 cm2
AV Area mean vel: 1.57 cm2
AV Mean grad: 4.5 mmHg
AV Peak grad: 8.9 mmHg
Ao pk vel: 1.5 m/s
Area-P 1/2: 2.93 cm2
Calc EF: 78.4 %
Height: 67 in
S' Lateral: 1.1 cm
Single Plane A2C EF: 71.2 %
Single Plane A4C EF: 84.5 %
Weight: 2734.4 oz

## 2022-08-27 LAB — BASIC METABOLIC PANEL
Anion gap: 9 (ref 5–15)
BUN: 38 mg/dL — ABNORMAL HIGH (ref 8–23)
CO2: 22 mmol/L (ref 22–32)
Calcium: 8.8 mg/dL — ABNORMAL LOW (ref 8.9–10.3)
Chloride: 106 mmol/L (ref 98–111)
Creatinine, Ser: 1.84 mg/dL — ABNORMAL HIGH (ref 0.44–1.00)
GFR, Estimated: 28 mL/min — ABNORMAL LOW (ref >60)
Glucose, Bld: 101 mg/dL — ABNORMAL HIGH (ref 70–99)
Potassium: 3 mmol/L — ABNORMAL LOW (ref 3.5–5.1)
Sodium: 137 mmol/L (ref 135–145)

## 2022-08-27 LAB — GLUCOSE, CAPILLARY
Glucose-Capillary: 76 mg/dL (ref 70–99)
Glucose-Capillary: 152 mg/dL — ABNORMAL HIGH (ref 70–99)
Glucose-Capillary: 172 mg/dL — ABNORMAL HIGH (ref 70–99)
Glucose-Capillary: 156 mg/dL — ABNORMAL HIGH (ref 70–99)

## 2022-08-27 LAB — MAGNESIUM: Magnesium: 2 mg/dL (ref 1.7–2.4)

## 2022-08-27 MED ORDER — POTASSIUM CHLORIDE CRYS ER 20 MEQ PO TBCR
40.0000 meq | EXTENDED_RELEASE_TABLET | ORAL | Status: AC
  Administered 2022-08-27 (×2): 40 meq via ORAL
  Filled 2022-08-27 (×2): qty 2

## 2022-08-27 MED ORDER — POTASSIUM CHLORIDE CRYS ER 20 MEQ PO TBCR: 40.0000 meq | EXTENDED_RELEASE_TABLET | Freq: Once | ORAL | Status: DC

## 2022-08-27 NOTE — Assessment & Plan Note (Signed)
Continue tadalafil and Letairis

## 2022-08-27 NOTE — Assessment & Plan Note (Signed)
Slightly worsening than baseline.   Recent baseline creatinine 1.3-1.7.   Cr on admission 1.83, stable today. --Follow BMP's

## 2022-08-27 NOTE — Assessment & Plan Note (Signed)
Continue home regimen. IV hydralazine PRN

## 2022-08-27 NOTE — Assessment & Plan Note (Signed)
Continue statin. 

## 2022-08-27 NOTE — Assessment & Plan Note (Addendum)
>>  ASSESSMENT AND PLAN FOR CHRONIC DIASTOLIC HEART FAILURE (HCC) WRITTEN ON 08/29/2022  2:05 PM BY Linell Shawn A, DO  Presented with dyspnea, edema, orthopnea, wt gain, elevated BNP, consistent with decompensation, despite outpatient increase in her diuretic regimen. Echo on 02/22/2022 showed EF of 50-60% with grade 1 diastolic dysfunction.  Patient has weight gain, leg edema, elevated BNP 2366, positive JVD, clinically consistent with CHF exacerbation. Given Lasix 80 mg IV in the ED --Diuresed well with Lasix 40 mg bid by IV  --Discharge on PO Lasix 40 mg BID (slight increase from recent regimen of 40 mg qAM, 20 mg qPM --Follow up with Cardiology as scheduled in 2 weeks --Echo was stable from previous - see report --Daily weights & strict I/O's --Low sodium diet, fluid restriction --Monitor renal function and electrolytes  >>ASSESSMENT AND PLAN FOR RIGHT HEART FAILURE (HCC) WRITTEN ON 08/27/2022  1:40 PM BY Janesia Joswick A, DO  Due to pulmonary HTN On diuresis as outlined. Contributes to peripheral edema.

## 2022-08-27 NOTE — Assessment & Plan Note (Signed)
Continue home regimen and PRN bronchodilators

## 2022-08-27 NOTE — Assessment & Plan Note (Signed)
Continue levothyroxine 

## 2022-08-27 NOTE — Assessment & Plan Note (Signed)
Very mild elevation of hs-troponin 26 >> 25. No chest pain or acute EKG changes.

## 2022-08-27 NOTE — Assessment & Plan Note (Signed)
Chronic per patient.  She tool Imodium before coming to hospital.   No BM since admission, will cancel C diff screen given pt has no abdominal / GI complaints, fever, leukocytosis.

## 2022-08-27 NOTE — Assessment & Plan Note (Signed)
Recent A1c 6.3, well-controlled. --Sliding scale Novolog --Basal insulin 10 unit daily

## 2022-08-27 NOTE — Progress Notes (Signed)
Progress Note   Patient: Jamie Murray DOB: 16-Jan-1946 DOA: 08/26/2022     1 DOS: the patient was seen and examined on 08/27/2022   Brief hospital course: Jamie Murray is a 77 y.o. female with medical history significant of right heart failure, diastolic CHF, pulmonary hypertension, hypertension, hyperlipidemia, diabetes mellitus, COPD, stroke, hypothyroidism, depression with anxiety, CKD-3B, interstitial lung disease, who presented to the ED on 08/26/2022 for evaluation of progressively worsening shortness oif breath, leg swelling, 12 lb weight gain, orthopnea.   Patient reported that her cardiologist, Dr. [name], had increased her Lasix dose to 40 mg BID, but she had no significant improvement of her shortness breath. She also reported 3-4 episodes diarrhea daily for a few weeks, but reports history of chronic intermittent diarrhea and took Imodium before coming to hospital.   Patient was found to have elevated BNP 2366, no leukocytosis.  Chest xray showed cardiomegaly, tiny right pleural effusion.  Vitals were normal except RR 24.  No hypoxia.    Admitted to medicine service and started on IV Lasix for diuresis.    Assessment and Plan: * Acute on chronic diastolic CHF (congestive heart failure) ([place]) Presented with dyspnea, edema, orthopnea, wt gain, elevated BNP, consistent with decompensation, despite outpatient increase in her diuretic regimen. Echo on 02/22/2022 showed EF of 50-60% with grade 1 diastolic dysfunction.  Patient has weight gain, leg edema, elevated BNP 2366, positive JVD, clinically consistent with CHF exacerbation. Given Lasix 80 mg IV in the ED --Continue Lasix 40 mg bid by IV  --Follow pending Echo --Daily weights & strict I/O's --Low sodium diet, fluid restriction --Monitor renal function and electrolytes  Right heart failure (HCC) Due to pulmonary HTN On diuresis as outlined. Contributes to peripheral edema.  Primary pulmonary  hypertension (HCC) Continue tadalafil and Letairis  Interstitial pulmonary disease (HCC) Continue home regimen and PRN bronchodilators  Hypertension Continue home regimen. IV hydralazine PRN  Hyperlipidemia Continue statin  Hypothyroidism Continue levothyroxine  Type II diabetes mellitus with renal manifestations (HCC) Recent A1c 6.3, well-controlled. --Sliding scale Novolog --Basal insulin 10 unit daily  Myocardial injury Very mild elevation of hs-troponin 26 >> 25. No chest pain or acute EKG changes.  Chronic kidney disease, stage 3b (HCC) Slightly worsening than baseline.   Recent baseline creatinine 1.3-1.7.   Cr on admission 1.82, stable today. --Follow BMP's  History of stroke Continue ASA, statin  Diarrhea Chronic per patient.  She tool Imodium before coming to hospital.   No BM since admission, will cancel C diff screen given pt has no abdominal / GI complaints, fever, leukocytosis.  Anxiety and depression Continue home medications         Subjective: Pt seen after echo this AM.  She reports feeling better, less short of breath and leg swelling improving.  She hopes to go home tomorrow but agreeable to continue IV Lasix for another day and reassess.  She reports follow up with Dr. [name] already scheduled in  2 weeks.   Physical Exam: Vitals:   08/27/22 0307 08/27/22 0312 08/27/22 0822 08/27/22 1138  BP:  (!) 109/55 111/66 114/60  Pulse:  (!) 47 (!) 49 (!) 49  Resp:  18 20 20   Temp:  (!) 97.4 F (36.3 C) 98.3 F (36.8 C) 98.1 F (36.7 C)  TempSrc:      SpO2:  100% 96% 98%  Weight: 77.5 kg     Height:       General exam: awake, alert, no acute distress HEENT:  atraumatic, clear conjunctiva, anicteric sclera, moist mucus membranes, hearing grossly normal  Respiratory system: CTAB, no wheezes, rales or rhonchi, normal respiratory effort, on room air. Cardiovascular system: normal S1/S2, RRR, no JVD, murmurs, rubs, gallops, 1+ lower extremity  edema.   Gastrointestinal system: soft, NT, ND Central nervous system: A&O x4. no gross focal neurologic deficits, normal speech Extremities: moves all, 1+ BLE edema, normal tone Skin: dry, intact, normal temperature, normal color, No rashes, lesions or ulcers Psychiatry: normal mood, congruent affect, judgement and insight appear normal     Data Reviewed:  Notable labs --- Cr stable 1.82>>1.84, K 3.0 being replaced, glucose 101, BUN 38 from 32   Family Communication: None present. Pt is fully A&O and able to update.   Disposition: Status is: Inpatient Remains inpatient appropriate because: remains of  IV diuresis pending furhter clinical improvement in volume status to prevent re-admission   Planned Discharge Destination: Home    Time spent: 42 minutes  Author: Ezekiel Slocumb, DO 08/27/2022 1:40 PM  For on call review [email or web address].

## 2022-08-27 NOTE — Assessment & Plan Note (Signed)
Due to pulmonary HTN On diuresis as outlined. Contributes to peripheral edema.

## 2022-08-27 NOTE — Hospital Course (Addendum)
Jamie Murray is a 77 y.o. female with medical history significant of right heart failure, diastolic CHF, pulmonary hypertension, hypertension, hyperlipidemia, diabetes mellitus, COPD, stroke, hypothyroidism, depression with anxiety, CKD-3B, interstitial lung disease, who presented to the ED on 08/26/2022 for evaluation of progressively worsening shortness oif breath, leg swelling, 12 lb weight gain, orthopnea.   Patient reported that her cardiologist, Dr. [name], had increased her Lasix dose to 40 mg BID, but she had no significant improvement of her shortness breath. She also reported 3-4 episodes diarrhea daily for a few weeks, but reports history of chronic intermittent diarrhea and took Imodium before coming to hospital.   Patient was found to have elevated BNP 2366, no leukocytosis.  Chest xray showed cardiomegaly, tiny right pleural effusion.  Vitals were normal except RR 24.  No hypoxia.    Admitted to medicine service and started on IV Lasix for diuresis.

## 2022-08-27 NOTE — Assessment & Plan Note (Signed)
Continue ASA, statin. ?

## 2022-08-27 NOTE — Assessment & Plan Note (Signed)
Continue home medications. 

## 2022-08-28 DIAGNOSIS — I5033 Acute on chronic diastolic (congestive) heart failure: Secondary | ICD-10-CM | POA: Diagnosis not present

## 2022-08-28 LAB — BASIC METABOLIC PANEL
Anion gap: 13 (ref 5–15)
BUN: 37 mg/dL — ABNORMAL HIGH (ref 8–23)
CO2: 23 mmol/L (ref 22–32)
Calcium: 9.1 mg/dL (ref 8.9–10.3)
Chloride: 101 mmol/L (ref 98–111)
Creatinine, Ser: 1.83 mg/dL — ABNORMAL HIGH (ref 0.44–1.00)
GFR, Estimated: 28 mL/min — ABNORMAL LOW (ref >60)
Glucose, Bld: 116 mg/dL — ABNORMAL HIGH (ref 70–99)
Potassium: 3.6 mmol/L (ref 3.5–5.1)
Sodium: 137 mmol/L (ref 135–145)

## 2022-08-28 LAB — GLUCOSE, CAPILLARY: Glucose-Capillary: 106 mg/dL — ABNORMAL HIGH (ref 70–99)

## 2022-08-28 LAB — MAGNESIUM: Magnesium: 1.9 mg/dL (ref 1.7–2.4)

## 2022-08-28 MED ORDER — FUROSEMIDE 20 MG PO TABS: 40.0000 mg | ORAL_TABLET | Freq: Two times a day (BID) | ORAL | 0 refills | Status: DC

## 2022-08-28 NOTE — Discharge Summary (Signed)
Physician Discharge Summary   Patient: Jamie Murray MRN: [id number] DOB: [birth year]  Admit date:     08/26/2022  Discharge date: 08/29/22  Discharge Physician: Ezekiel Slocumb   PCP: [name], FNP   Recommendations at discharge:   Follow up with Cardiology in 2 weeks as scheduled Follow up with Primary Care  Repeat BMP, Mg, CBC in 2 weeks at follow up Adjust Lasix dose as needed - increased slightly from 40 mg qAM, 20 mg qPM to 40 mg BID   Discharge Diagnoses: Principal Problem:   Acute on chronic diastolic CHF (congestive heart failure) (HCC) Active Problems:   Right heart failure ([place])   Primary pulmonary hypertension ([place])   Interstitial pulmonary disease ([place])   Hypertension   Hypothyroidism   Hyperlipidemia   Myocardial injury   Type II diabetes mellitus with renal manifestations ([place])   Chronic kidney disease, stage 3b ([place])   History of stroke   Diarrhea   Anxiety and depression  Resolved Problems:   * No resolved hospital problems. *  Hospital Course: Jamie Murray is a 77 y.o. female with medical history significant of right heart failure, diastolic CHF, pulmonary hypertension, hypertension, hyperlipidemia, diabetes mellitus, COPD, stroke, hypothyroidism, depression with anxiety, CKD-3B, interstitial lung disease, who presented to the ED on 08/26/2022 for evaluation of progressively worsening shortness oif breath, leg swelling, 12 lb weight gain, orthopnea.   Patient reported that her cardiologist, Dr. [name], had increased her Lasix dose to 40 mg BID, but she had no significant improvement of her shortness breath. She also reported 3-4 episodes diarrhea daily for a few weeks, but reports history of chronic intermittent diarrhea and took Imodium before coming to hospital.   Patient was found to have elevated BNP 2366, no leukocytosis.  Chest xray showed cardiomegaly, tiny right pleural effusion.  Vitals were normal except RR 24.  No hypoxia.     Admitted to medicine service and started on IV Lasix for diuresis.    Further hospital course and management as outlined below.   Assessment and Plan: * Acute on chronic diastolic CHF (congestive heart failure) ([place]) Presented with dyspnea, edema, orthopnea, wt gain, elevated BNP, consistent with decompensation, despite outpatient increase in her diuretic regimen. Echo on 02/22/2022 showed EF of 50-60% with grade 1 diastolic dysfunction.  Patient has weight gain, leg edema, elevated BNP 2366, positive JVD, clinically consistent with CHF exacerbation. Given Lasix 80 mg IV in the ED --Diuresed well with Lasix 40 mg bid by IV  --Discharge on PO Lasix 40 mg BID (slight increase from recent regimen of 40 mg qAM, 20 mg qPM --Follow up with Cardiology as scheduled in 2 weeks --Echo was stable from previous - see report --Daily weights & strict I/O's --Low sodium diet, fluid restriction --Monitor renal function and electrolytes  Right heart failure (HCC) Due to pulmonary HTN On diuresis as outlined. Contributes to peripheral edema.  Primary pulmonary hypertension (HCC) Continue tadalafil and Letairis  Interstitial pulmonary disease (HCC) Continue home regimen and PRN bronchodilators  Hypertension Continue home regimen. IV hydralazine PRN  Hyperlipidemia Continue statin  Hypothyroidism Continue levothyroxine  Type II diabetes mellitus with renal manifestations (HCC) Recent A1c 6.3, well-controlled. --Sliding scale Novolog --Basal insulin 10 unit daily  Myocardial injury Very mild elevation of hs-troponin 26 >> 25. No chest pain or acute EKG changes.  Chronic kidney disease, stage 3b (HCC) Slightly worsening than baseline.   Recent baseline creatinine 1.3-1.7.   Cr on admission 1.83, stable today. --Follow  BMP's  History of stroke Continue ASA, statin  Diarrhea Chronic per patient.  She tool Imodium before coming to hospital.   No BM since admission, will cancel C  diff screen given pt has no abdominal / GI complaints, fever, leukocytosis.  Anxiety and depression Continue home medications          Consultants: None Procedures performed: Echo 1. Left ventricular ejection fraction, by estimation, is 55 to 60%. The  left ventricle has normal function. The left ventricle has no regional  wall motion abnormalities. There is moderate concentric left ventricular  hypertrophy. Left ventricular  diastolic parameters are consistent with Grade I diastolic dysfunction  (impaired relaxation). There is the interventricular septum is flattened  in systole and diastole, consistent with right ventricular pressure and  volume overload.   2. Right ventricular systolic function is severely reduced. The right  ventricular size is severely enlarged. There is severely elevated  pulmonary artery systolic pressure. The estimated right ventricular  systolic pressure is [id number] mmHg.   3. Right atrial size was moderately dilated.   4. The mitral valve is normal in structure. No evidence of mitral valve  regurgitation. No evidence of mitral stenosis.   5. Tricuspid valve regurgitation is severe.   6. The aortic valve is tricuspid. Aortic valve regurgitation is not  visualized. No aortic stenosis is present.   7. The inferior vena cava is dilated in size with <50% respiratory  variability, suggesting right atrial pressure of 15 mmHg.    Disposition: Home  Diet recommendation:  Discharge Diet Orders (From admission, onward)     Start     Ordered   08/28/22 0000  Diet - low sodium heart healthy        08/28/22 1023           Cardiac and Carb modified diet DISCHARGE MEDICATION: Allergies as of 08/28/2022       Reactions   Atorvastatin    Note: joint pain   Fluoxetine Other (See Comments)   "felt crazy"   Levemir [insulin Detemir] Other (See Comments)   Bruising   Prozac [fluoxetine Hcl] Other (See Comments)   "felt crazy"        Medication List      TAKE these medications    Accu-Chek Guide test strip Generic drug: glucose blood 3 each by Other route 3 (three) times daily as needed for other (low blood sugar symptoms).   ambrisentan 10 MG tablet Commonly known as: LETAIRIS TAKE 1 TABLET BY MOUTH EVERY DAY   amLODipine 10 MG tablet Commonly known as: NORVASC Take 1 tablet (10 mg total) by mouth daily.   aspirin 81 MG tablet Take 81 mg by mouth daily.   B-D UF III MINI PEN NEEDLES 31G X 5 MM Misc Generic drug: Insulin Pen Needle 4 (four) times daily   clonazePAM 1 MG tablet Commonly known as: KLONOPIN Take 1 mg by mouth 2 (two) times daily as needed.   dapagliflozin propanediol 10 MG Tabs tablet Commonly known as: FARXIGA Take 1 tablet (10 mg total) by mouth daily before breakfast.   escitalopram 10 MG tablet Commonly known as: LEXAPRO Take 10 mg by mouth daily.   esomeprazole 40 MG capsule Commonly known as: NEXIUM Take 40 mg by mouth daily at 12 noon.   furosemide 20 MG tablet Commonly known as: LASIX Take 2 tablets (40 mg total) by mouth 2 (two) times daily. What changed: See the new instructions.   Hemocyte Plus 106-1 MG Caps Take  1 capsule by mouth daily.   hydrALAZINE 50 MG tablet Commonly known as: APRESOLINE Take 50 mg by mouth 2 (two) times daily.   insulin aspart 100 UNIT/ML injection Commonly known as: novoLOG Inject 4 Units into the skin. Bs greater than 200   levothyroxine 75 MCG tablet Commonly known as: SYNTHROID TAKE 1 TABLET BY MOUTH EVERY MORNING ON AN EMPTY STOMACH   loratadine 10 MG tablet Commonly known as: CLARITIN Take 10 mg by mouth daily.   meclizine 25 MG tablet Commonly known as: ANTIVERT Take 25 mg by mouth 3 (three) times daily as needed for dizziness.   metoprolol succinate 50 MG 24 hr tablet Commonly known as: TOPROL-XL Take 50 mg by mouth daily. Take with or immediately following a meal.   Multi Adult Gummies Chew Chew by mouth daily.   potassium chloride  SA 20 MEQ tablet Commonly known as: KLOR-CON M Take 1 tablet (20 mEq total) by mouth daily.   rosuvastatin 10 MG tablet Commonly known as: CRESTOR Take 10 mg by mouth daily.   tadalafil (PAH) 20 MG tablet Commonly known as: ADCIRCA Take 2 tablets (40 mg total) by mouth daily.   TRESIBA [place] Inject 16 Units into the skin daily in the afternoon.   Vitamin D3 125 MCG (5000 UT) capsule Generic drug: Cholecalciferol Take 1 capsule by mouth daily.        Follow-up Information     Larey Dresser, MD. Go to.   Specialty: Cardiology Why: follow up as scheduled Contact information: [address] [place] [address] [phone]         [name], FNP Follow up.   Specialty: Family Medicine Why: Follow up in 1-2 weeks Contact information: [address] [place] [address] [phone]                Discharge Exam: Filed Weights   08/26/22 1101 08/27/22 0307 08/28/22 0658  Weight: 78.9 kg 77.5 kg 76.6 kg   General exam: awake, alert, no acute distress HEENT: atraumatic, clear conjunctiva, anicteric sclera, moist mucus membranes, hearing grossly normal  Respiratory system: CTAB, no wheezes, rales or rhonchi, normal respiratory effort. Cardiovascular system: normal S1/S2, RRR, no JVD, murmurs, rubs, gallops,  no pedal edema.   Gastrointestinal system: soft, NT, ND, no HSM felt, +bowel sounds. Central nervous system: A&O x4. no gross focal neurologic deficits, normal speech Extremities: moves all , no edema, normal tone Skin: dry, intact, normal temperature, normal color, No rashes, lesions or ulcers Psychiatry: normal mood, congruent affect, judgement and insight appear normal   Condition at discharge: stable  The results of significant diagnostics from this hospitalization (including imaging, microbiology, ancillary and laboratory) are listed below for reference.   Imaging Studies: ECHOCARDIOGRAM COMPLETE  Result Date:  08/27/2022    ECHOCARDIOGRAM REPORT   Patient Name:   Jamie Murray Date of Exam: 08/27/2022 Medical Rec #:  [id number]             Height:       67.0 in Accession #:    [id number]            Weight:       170.9 lb Date of Birth:  [birth year]             BSA:          1.892 m Patient Age:    2 years              BP:  122/59 mmHg Patient Gender: F                     HR:           50 bpm. Exam Location:  [facility] Procedure: 2D Echo, Color Doppler and Cardiac Doppler Indications:     Acute Diastolic CHF [id number]  History:         Patient has prior history of Echocardiogram examinations. COPD                  and TIA; Risk Factors:Diabetes and Dyslipidemia.  Sonographer:     L. Thornton-Maynard Referring Phys:  [id number] Soledad Gerlach NIU Diagnosing Phys: Ida Rogue MD IMPRESSIONS  1. Left ventricular ejection fraction, by estimation, is 55 to 60%. The left ventricle has normal function. The left ventricle has no regional wall motion abnormalities. There is moderate concentric left ventricular hypertrophy. Left ventricular diastolic parameters are consistent with Grade I diastolic dysfunction (impaired relaxation). There is the interventricular septum is flattened in systole and diastole, consistent with right ventricular pressure and volume overload.  2. Right ventricular systolic function is severely reduced. The right ventricular size is severely enlarged. There is severely elevated pulmonary artery systolic pressure. The estimated right ventricular systolic pressure is [id number] mmHg.  3. Right atrial size was moderately dilated.  4. The mitral valve is normal in structure. No evidence of mitral valve regurgitation. No evidence of mitral stenosis.  5. Tricuspid valve regurgitation is severe.  6. The aortic valve is tricuspid. Aortic valve regurgitation is not visualized. No aortic stenosis is present.  7. The inferior vena cava is dilated in size with <50% respiratory variability, suggesting right atrial pressure of 15  mmHg. FINDINGS  Left Ventricle: Left ventricular ejection fraction, by estimation, is 55 to 60%. The left ventricle has normal function. The left ventricle has no regional wall motion abnormalities. The left ventricular internal cavity size was normal in size. There is  moderate concentric left ventricular hypertrophy. The interventricular septum is flattened in systole and diastole, consistent with right ventricular pressure and volume overload. Left ventricular diastolic parameters are consistent with Grade I diastolic dysfunction (impaired relaxation). Right Ventricle: The right ventricular size is severely enlarged. No increase in right ventricular wall thickness. Right ventricular systolic function is severely reduced. There is severely elevated pulmonary artery systolic pressure. The tricuspid regurgitant velocity is 4.15 m/s, and with an assumed right atrial pressure of 15 mmHg, the estimated right ventricular systolic pressure is [id number] mmHg. Left Atrium: Left atrial size was normal in size. Right Atrium: Right atrial size was moderately dilated. Pericardium: There is no evidence of pericardial effusion. Mitral Valve: The mitral valve is normal in structure. No evidence of mitral valve regurgitation. No evidence of mitral valve stenosis. Tricuspid Valve: The tricuspid valve is normal in structure. Tricuspid valve regurgitation is severe. No evidence of tricuspid stenosis. Aortic Valve: The aortic valve is tricuspid. Aortic valve regurgitation is not visualized. No aortic stenosis is present. Aortic valve mean gradient measures 4.5 mmHg. Aortic valve peak gradient measures 8.9 mmHg. Aortic valve area, by VTI measures 1.48 cm. Pulmonic Valve: The pulmonic valve was normal in structure. Pulmonic valve regurgitation is mild to moderate. No evidence of pulmonic stenosis. Aorta: The aortic root is normal in size and structure. Venous: The inferior vena cava is dilated in size with less than 50% respiratory  variability, suggesting right atrial pressure of 15 mmHg. IAS/Shunts: No atrial level shunt detected by color flow Doppler.  LEFT VENTRICLE  PLAX 2D LVIDd:         2.90 cm     Diastology LVIDs:         1.10 cm     LV e' medial:    2.92 cm/s LV PW:         1.30 cm     LV E/e' medial:  21.0 LV IVS:        1.70 cm     LV e' lateral:   3.57 cm/s LVOT diam:     1.90 cm     LV E/e' lateral: 17.2 LV SV:         45 LV SV Index:   24 LVOT Area:     2.84 cm  LV Volumes (MOD) LV vol d, MOD A2C: 28.6 ml LV vol d, MOD A4C: 34.3 ml LV vol s, MOD A2C: 8.3 ml LV vol s, MOD A4C: 5.3 ml LV SV MOD A2C:     20.4 ml LV SV MOD A4C:     34.3 ml LV SV MOD BP:      24.6 ml RIGHT VENTRICLE            IVC RV Basal diam:  5.50 cm    IVC diam: 2.40 cm RV Mid diam:    4.70 cm RV S prime:     8.19 cm/s TAPSE (M-mode): 1.4 cm LEFT ATRIUM             Index        RIGHT ATRIUM           Index LA diam:        3.30 cm 1.74 cm/m   RA Area:     26.85 cm LA Vol (A2C):   34.5 ml 18.24 ml/m  RA Volume:   100.40 ml 53.08 ml/m LA Vol (A4C):   20.1 ml 10.63 ml/m LA Biplane Vol: 28.7 ml 15.17 ml/m  AORTIC VALVE                     PULMONIC VALVE AV Area (Vmax):    1.59 cm      PV Vmax:          0.67 m/s AV Area (Vmean):   1.57 cm      PV Peak grad:     1.8 mmHg AV Area (VTI):     1.48 cm      PR End Diast Vel: 3.34 msec AV Vmax:           149.50 cm/s AV Vmean:          100.250 cm/s AV VTI:            0.306 m AV Peak Grad:      8.9 mmHg AV Mean Grad:      4.5 mmHg LVOT Vmax:         83.80 cm/s LVOT Vmean:        55.500 cm/s LVOT VTI:          0.159 m LVOT/AV VTI ratio: 0.52  AORTA Ao Root diam: 2.90 cm Ao Asc diam:  3.00 cm MITRAL VALVE               TRICUSPID VALVE MV Area (PHT): 2.93 cm    TV Peak grad:   55.4 mmHg MV Decel Time: 259 msec    TV Vmax:        3.72 m/s MV E velocity: 61.30 cm/s  TR Peak grad:   68.9 mmHg  MV A velocity: 84.40 cm/s  TR Vmax:        415.00 cm/s MV E/A ratio:  0.73                            SHUNTS                             Systemic VTI:  0.16 m                            Systemic Diam: 1.90 cm Ida Rogue MD Electronically signed by Ida Rogue MD Signature Date/Time: 08/27/2022/1:39:59 PM    Final    DG Chest 2 View  Result Date: 08/26/2022 CLINICAL DATA:  Shortness of breath EXAM: CHEST - 2 VIEW COMPARISON:  01/10/2020 FINDINGS: Enlarged cardiopericardial silhouette. Calcified aorta. Film is rotated to the left. Slightly blunted right costophrenic angle. Tiny effusion versus pleural thickening. No consolidation, pneumothorax or edema. Overlapping cardiac leads. IMPRESSION: Enlarged heart. No edema. Tiny right effusion versus pleural thickening. Rotated radiograph. Electronically Signed   By: Jill Side M.D.   On: 08/26/2022 11:53    Microbiology: Results for orders placed or performed during the hospital encounter of 02/07/22  Resp Panel by RT-PCR (Flu A&B, Covid) Anterior Nasal Swab     Status: None   Collection Time: 02/07/22  5:26 PM   Specimen: Anterior Nasal Swab  Result Value Ref Range Status   SARS Coronavirus 2 by RT PCR NEGATIVE NEGATIVE Final    Comment: (NOTE) SARS-CoV-2 target nucleic acids are NOT DETECTED.  The SARS-CoV-2 RNA is generally detectable in upper respiratory specimens during the acute phase of infection. The lowest concentration of SARS-CoV-2 viral copies this assay can detect is 138 copies/mL. A negative result does not preclude SARS-Cov-2 infection and should not be used as the sole basis for treatment or other patient management decisions. A negative result may occur with  improper specimen collection/handling, submission of specimen other than nasopharyngeal swab, presence of viral mutation(s) within the areas targeted by this assay, and inadequate number of viral copies(<138 copies/mL). A negative result must be combined with clinical observations, patient history, and epidemiological information. The expected result is Negative.  Fact Sheet for Patients:   EntrepreneurPulse.com.au  Fact Sheet for Healthcare Providers:  IncredibleEmployment.be  This test is no t yet approved or cleared by the [place] FDA and  has been authorized for detection and/or diagnosis of SARS-CoV-2 by FDA under an Emergency Use Authorization (EUA). This EUA will remain  in effect (meaning this test can be used) for the duration of the COVID-19 declaration under Section 564(b)(1) of the Act, 21 U.S.C.section 360bbb-3(b)(1), unless the authorization is terminated  or revoked sooner.       Influenza A by PCR NEGATIVE NEGATIVE Final   Influenza B by PCR NEGATIVE NEGATIVE Final    Comment: (NOTE) The Xpert Xpress SARS-CoV-2/FLU/RSV plus assay is intended as an aid in the diagnosis of influenza from Nasopharyngeal swab specimens and should not be used as a sole basis for treatment. Nasal washings and aspirates are unacceptable for Xpert Xpress SARS-CoV-2/FLU/RSV testing.  Fact Sheet for Patients: EntrepreneurPulse.com.au  Fact Sheet for Healthcare Providers: IncredibleEmployment.be  This test is not yet approved or cleared by the [place] FDA and has been authorized for detection and/or diagnosis of SARS-CoV-2 by FDA under an Emergency Use Authorization (EUA).  This EUA will remain in effect (meaning this test can be used) for the duration of the COVID-19 declaration under Section 564(b)(1) of the Act, 21 U.S.C. section 360bbb-3(b)(1), unless the authorization is terminated or revoked.  Performed at [facility], [address] [place], [place] [address]     Labs: CBC: Recent Labs  Lab 08/26/22 1105  WBC 4.3  NEUTROABS 2.3  HGB 12.1  HCT 40.1  MCV 94.1  PLT [id number]   Basic Metabolic Panel: Recent Labs  Lab 08/24/22 1111 08/26/22 1105 08/27/22 0409 08/28/22 0348  NA 143 140 137 137  K 4.1 3.8 3.0* 3.6  CL 112* 106 106 101  CO2 22 22 22 23   GLUCOSE  146* 115* 101* 116*  BUN 33* 32* 38* 37*  CREATININE 1.83* 1.82* 1.84* 1.83*  CALCIUM 8.8* 9.0 8.8* 9.1  MG  --   --  2.0 1.9   Liver Function Tests: Recent Labs  Lab 08/26/22 1105  AST 27  ALT 21  ALKPHOS 70  BILITOT 1.0  PROT 6.8  ALBUMIN 3.7   CBG: Recent Labs  Lab 08/27/22 0818 08/27/22 1133 08/27/22 1641 08/27/22 2106 08/28/22 0840  GLUCAP 76 152* 172* 156* 106*    Discharge time spent: less than 30 minutes.  Signed: Ezekiel Slocumb, DO [facility] 08/29/2022

## 2022-08-28 NOTE — Progress Notes (Signed)
Pt maintained O2 sats >95% on room air while ambulating around the unit

## 2022-08-29 ENCOUNTER — Telehealth: Payer: Self-pay

## 2022-08-29 NOTE — Patient Outreach (Signed)
  Care Coordination   08/29/2022 Name: Jamie Murray MRN: [id number] DOB: 02-06-1946   Care Coordination Outreach Attempts:  An unsuccessful telephone outreach was attempted today to offer the patient information about available care coordination services as a benefit of their health plan.  Unable to reach patient or leave message.  Phone only rang.  Follow Up Plan:  Additional outreach attempts will be made to offer the patient care coordination information and services.   Encounter Outcome:  No Answer   Care Coordination Interventions:  No, not indicated    Quinn Plowman [facility] [place] [phone] direct line

## 2022-09-05 ENCOUNTER — Other Ambulatory Visit (HOSPITAL_COMMUNITY): Payer: Medicare PPO

## 2022-09-05 IMAGING — CT CT CHEST HIGH RESOLUTION W/O CM
2 of 5 series · 14 of 36 positions shown, 17 images · non-contrast
Comparison: CT chest angiogram, 10/25/2016

CLINICAL DATA: Interstitial lung disease

EXAM:
CT CHEST WITHOUT CONTRAST
TECHNIQUE: Multidetector CT imaging of the chest was performed following the
standard protocol without intravenous contrast. High resolution
imaging of the lungs, as well as inspiratory and expiratory imaging,
was performed.

[Series 6: coronals · coronal · 0.49mm/px · 3 of 127 slices shown]
[im 26/127  lung]
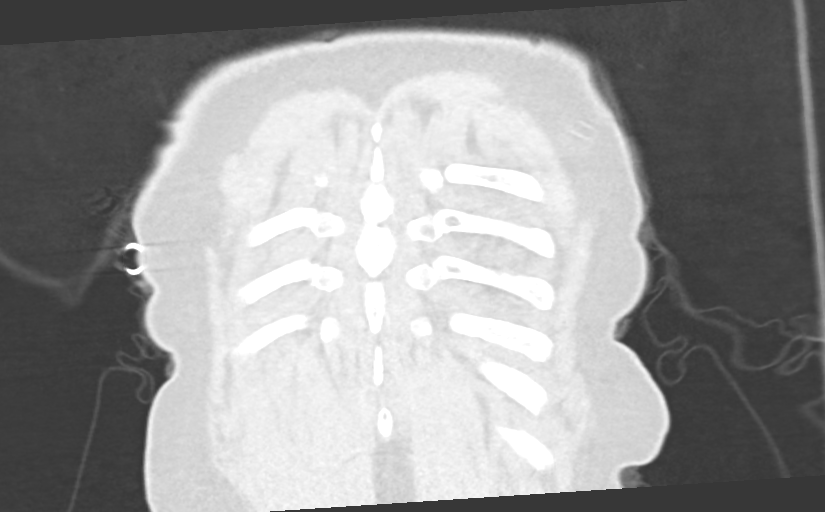
[im 51/127  lung]
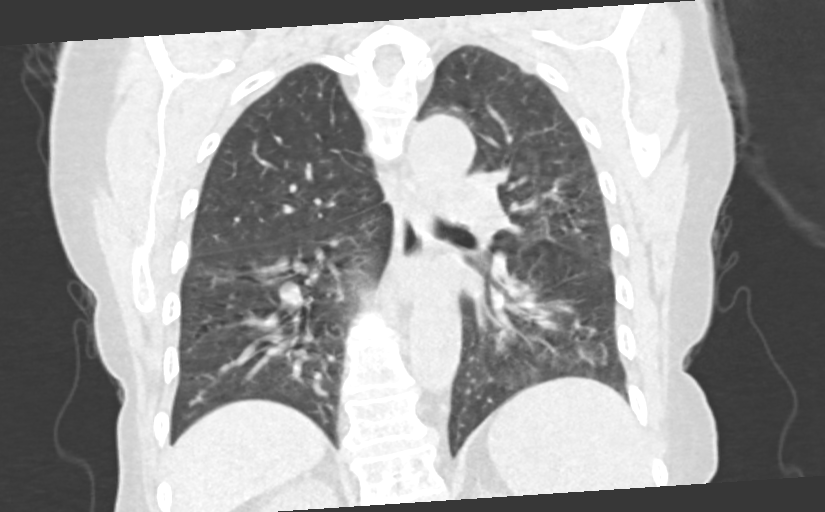
[im 76/127  lung]
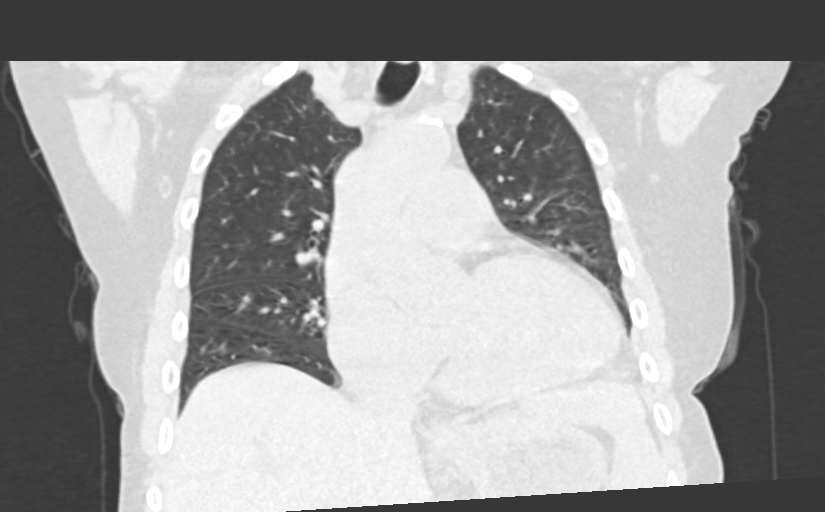

[Series 9: hi res thins · axial · 0.63mm/px · z∈[+1282,+1480]mm · 11 of 235 slices shown, 14 images]
[im 19/235  mediastinal]
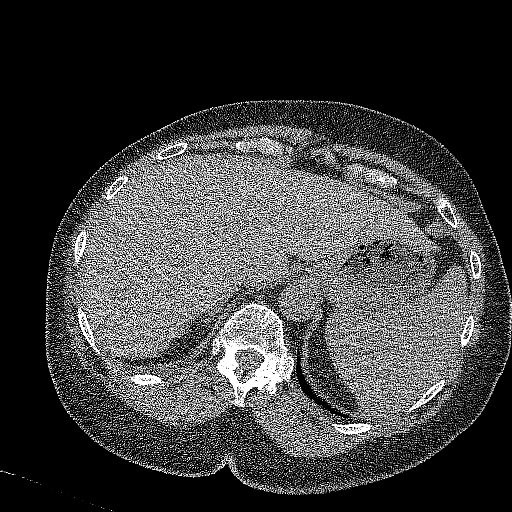
[im 19/235  lung]
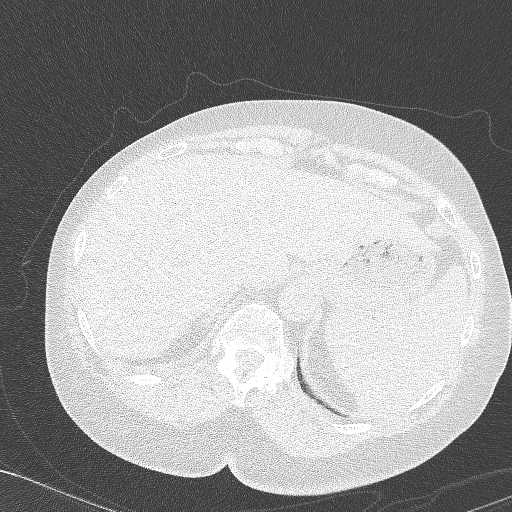
[im 37/235  lung]
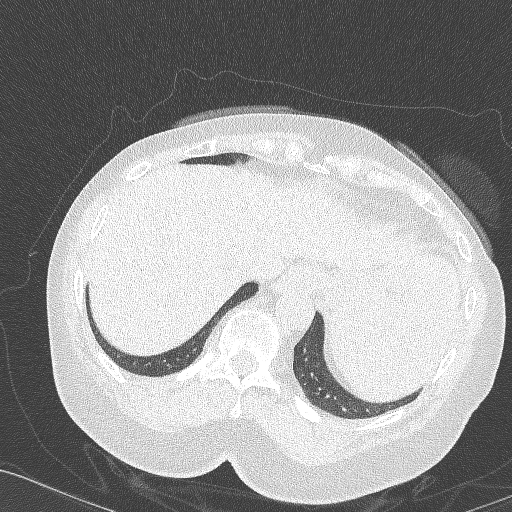
[im 55/235  lung]
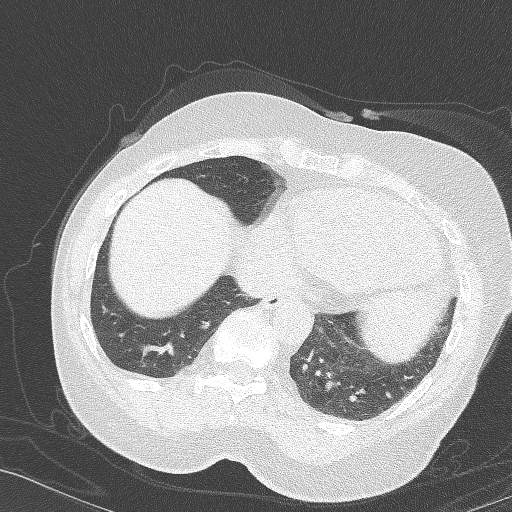
[im 73/235  lung]
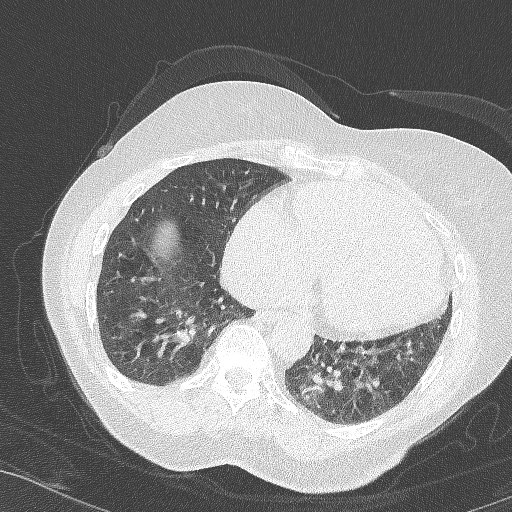
[im 91/235  mediastinal]
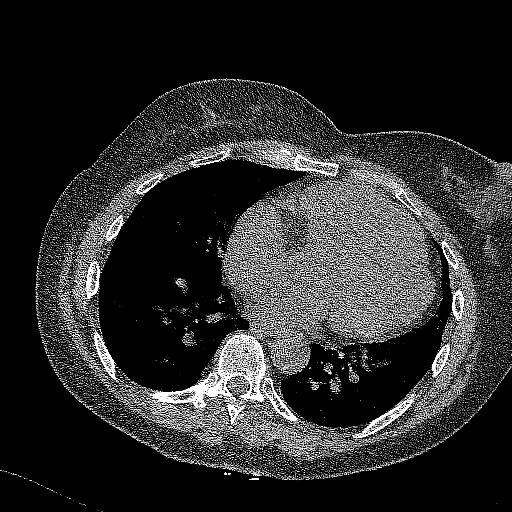
[im 91/235  lung]
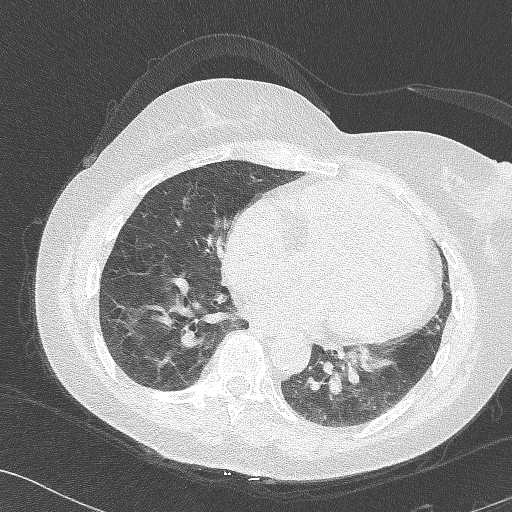
[im 127/235  lung]
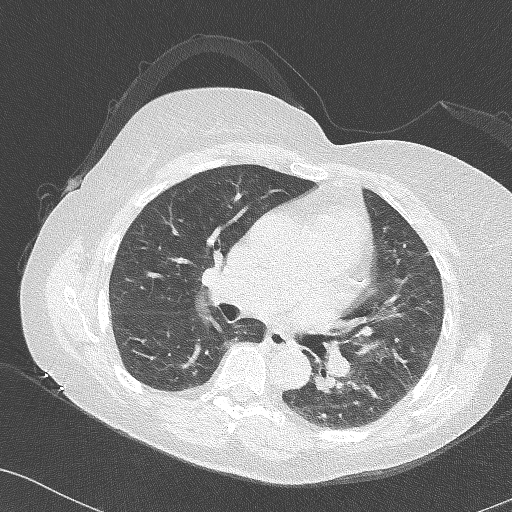
[im 145/235  lung]
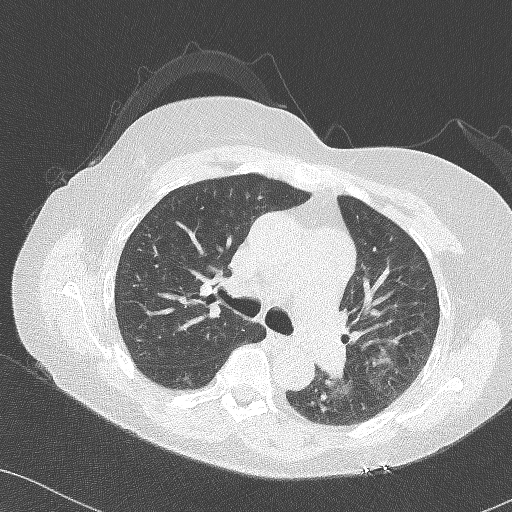
[im 163/235  lung]
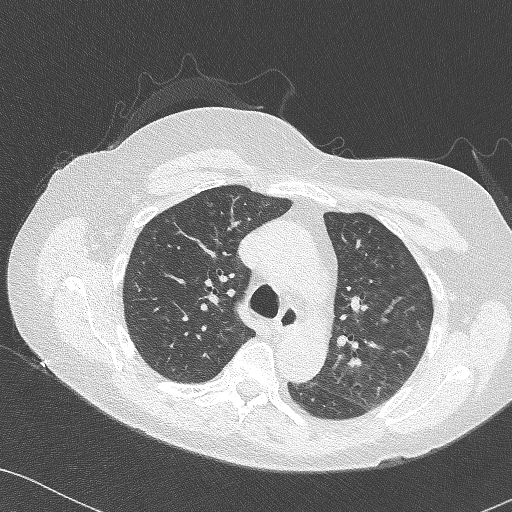
[im 181/235  mediastinal]
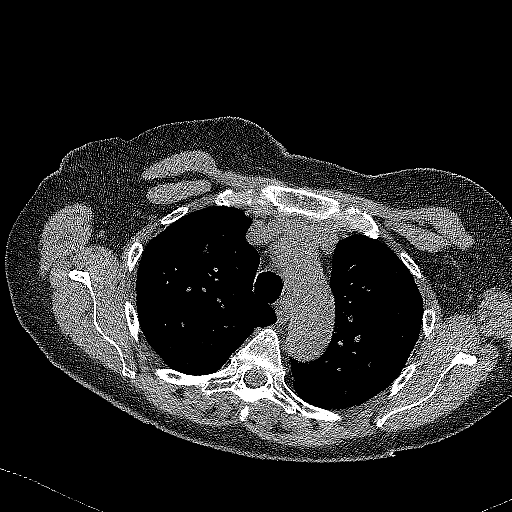
[im 181/235  lung]
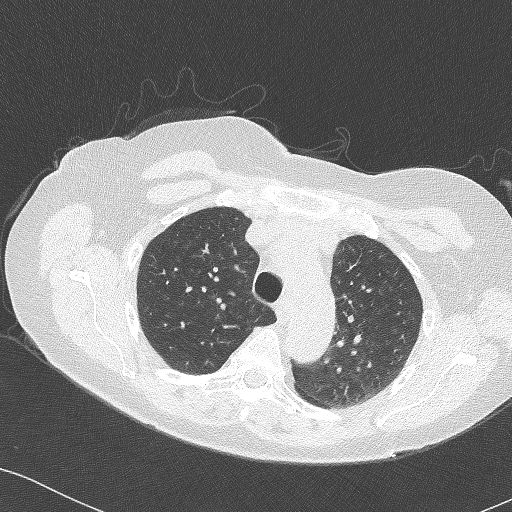
[im 199/235  lung]
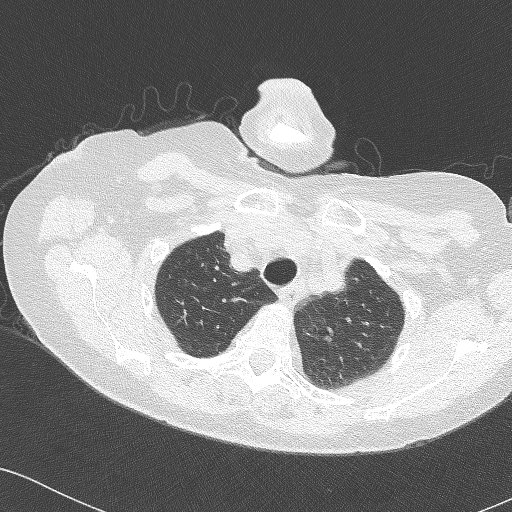
[im 217/235  lung]
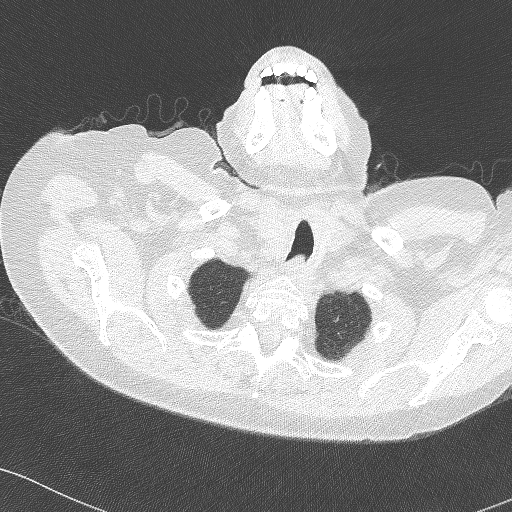

[14 of 36 positions shown; findings below may reference images not displayed]

FINDINGS: Cardiovascular: Aortic atherosclerosis. Normal heart size. Scattered
coronary artery calcifications. Enlargement of the main pulmonary
artery measuring up to 3.6 cm in caliber. No pericardial effusion.

Mediastinum/Nodes: No enlarged mediastinal, hilar, or axillary lymph
nodes. Thyroid gland, trachea, and esophagus demonstrate no
significant findings.

Lungs/Pleura: Examination is generally limited by breath motion
artifact, particularly in the lung bases. There is minimal scarring
or partial atelectasis of the left lung base. No significant air
trapping noted on expiratory phase sequence, however expiratory
excursion is very limited. No pleural effusion or pneumothorax.

Upper Abdomen: No acute abnormality.

Musculoskeletal: No chest wall mass or suspicious bone lesions
identified.
IMPRESSION: 1. Examination is generally limited by breath motion artifact,
particularly in the lung bases. There is minimal scarring or partial
atelectasis of the left lung base. No evidence of fibrotic
interstitial lung disease.
2. Enlargement of the main pulmonary artery, as can be seen in
pulmonary hypertension.
3. Coronary artery disease.  Aortic Atherosclerosis (TBC2K-SLM.M).

## 2022-09-06 ENCOUNTER — Other Ambulatory Visit
Admission: RE | Admit: 2022-09-06 | Discharge: 2022-09-06 | Disposition: A | Discharge status: Home / Self Care | Payer: Medicare PPO | Attending: Cardiology | Admitting: Cardiology

## 2022-09-06 DIAGNOSIS — I5032 Chronic diastolic (congestive) heart failure: Secondary | ICD-10-CM | POA: Diagnosis not present

## 2022-09-06 LAB — BASIC METABOLIC PANEL
Anion gap: 10 (ref 5–15)
BUN: 24 mg/dL — ABNORMAL HIGH (ref 8–23)
CO2: 26 mmol/L (ref 22–32)
Calcium: 9.2 mg/dL (ref 8.9–10.3)
Chloride: 105 mmol/L (ref 98–111)
Creatinine, Ser: 1.51 mg/dL — ABNORMAL HIGH (ref 0.44–1.00)
GFR, Estimated: 36 mL/min — ABNORMAL LOW (ref >60)
Glucose, Bld: 180 mg/dL — ABNORMAL HIGH (ref 70–99)
Potassium: 3.5 mmol/L (ref 3.5–5.1)
Sodium: 141 mmol/L (ref 135–145)

## 2022-09-07 ENCOUNTER — Other Ambulatory Visit: Payer: Self-pay | Admitting: Family

## 2022-09-08 ENCOUNTER — Encounter: Payer: Self-pay | Admitting: Emergency Medicine

## 2022-09-08 ENCOUNTER — Emergency Department: Payer: Medicare PPO

## 2022-09-08 ENCOUNTER — Other Ambulatory Visit: Payer: Self-pay

## 2022-09-08 DIAGNOSIS — I959 Hypotension, unspecified: Secondary | ICD-10-CM | POA: Diagnosis not present

## 2022-09-08 DIAGNOSIS — I509 Heart failure, unspecified: Secondary | ICD-10-CM | POA: Insufficient documentation

## 2022-09-08 DIAGNOSIS — R0602 Shortness of breath: Secondary | ICD-10-CM | POA: Diagnosis not present

## 2022-09-08 DIAGNOSIS — I272 Pulmonary hypertension, unspecified: Secondary | ICD-10-CM | POA: Diagnosis not present

## 2022-09-08 DIAGNOSIS — R069 Unspecified abnormalities of breathing: Secondary | ICD-10-CM | POA: Diagnosis not present

## 2022-09-08 DIAGNOSIS — R531 Weakness: Secondary | ICD-10-CM | POA: Diagnosis not present

## 2022-09-08 DIAGNOSIS — I110 Hypertensive heart disease with heart failure: Secondary | ICD-10-CM | POA: Diagnosis not present

## 2022-09-08 NOTE — ED Triage Notes (Addendum)
EMS brings pt in from home for c/o SHOB x mo; seen here last wk for same but symptoms persist; pt denies pain, denies cough; st taking lasix as rx

## 2022-09-09 ENCOUNTER — Emergency Department
Admission: EM | Admit: 2022-09-09 | Discharge: 2022-09-09 | Disposition: A | Discharge status: Home / Self Care | Payer: Medicare PPO | Attending: Emergency Medicine | Admitting: Emergency Medicine

## 2022-09-09 DIAGNOSIS — I272 Pulmonary hypertension, unspecified: Secondary | ICD-10-CM

## 2022-09-09 DIAGNOSIS — I509 Heart failure, unspecified: Secondary | ICD-10-CM

## 2022-09-09 LAB — BRAIN NATRIURETIC PEPTIDE: B Natriuretic Peptide: 2333.9 pg/mL — ABNORMAL HIGH (ref 0.0–100.0)

## 2022-09-09 LAB — CBC
HCT: 43.4 % (ref 36.0–46.0)
Hemoglobin: 13.7 g/dL (ref 12.0–15.0)
MCH: 27.9 pg (ref 26.0–34.0)
MCHC: 31.6 g/dL (ref 30.0–36.0)
MCV: 88.4 fL (ref 80.0–100.0)
Platelets: 100 10*3/uL — ABNORMAL LOW (ref 150–400)
RBC: 4.91 MIL/uL (ref 3.87–5.11)
RDW: 16 % — ABNORMAL HIGH (ref 11.5–15.5)
WBC: 5.3 10*3/uL (ref 4.0–10.5)
nRBC: 0 % (ref 0.0–0.2)

## 2022-09-09 LAB — BASIC METABOLIC PANEL
Anion gap: 14 (ref 5–15)
BUN: 28 mg/dL — ABNORMAL HIGH (ref 8–23)
CO2: 20 mmol/L — ABNORMAL LOW (ref 22–32)
Calcium: 9.3 mg/dL (ref 8.9–10.3)
Chloride: 101 mmol/L (ref 98–111)
Creatinine, Ser: 1.65 mg/dL — ABNORMAL HIGH (ref 0.44–1.00)
GFR, Estimated: 32 mL/min — ABNORMAL LOW (ref >60)
Glucose, Bld: 159 mg/dL — ABNORMAL HIGH (ref 70–99)
Potassium: 3.5 mmol/L (ref 3.5–5.1)
Sodium: 135 mmol/L (ref 135–145)

## 2022-09-09 LAB — TROPONIN I (HIGH SENSITIVITY): Troponin I (High Sensitivity): 40 ng/L — ABNORMAL HIGH (ref <18)

## 2022-09-09 NOTE — Discharge Instructions (Signed)
As we discussed, your evaluation was reassuring tonight and spite of your ongoing shortness of breath.  There is no indication you need to change anything with your medications.  We recommend you continue taking everything as prescribed and follow-up with your heart doctor at the next available opportunity.  Return to the emergency department if you develop new or worsening symptoms that concern you.

## 2022-09-09 NOTE — ED Provider Notes (Signed)
[facility] Provider Note    Event Date/Time   First MD Initiated Contact with Patient 09/09/22 [phone]     (approximate)   History   Shortness of Breath   HPI Jamie Murray is a 77 y.o. female who presents for evaluation of shortness of breath.  She said it has been going on for weeks but it seems to be getting a little bit worse.  She wants to make sure everything is okay.  She has been taking her furosemide at an increased dose since her last hospitalization.  She has not had any chest pain but she feels increasingly short of breath with exertion.  She has an upcoming follow-up appointment with her heart doctor but wants to be sure that nothing else can be done in the meantime.  No recent fever, nausea, vomiting, chest pain, abdominal pain, nor dysuria.     Physical Exam   Triage Vital Signs: ED Triage Vitals  Enc Vitals Group     BP 09/08/22 2347 109/72     Pulse Rate 09/08/22 2347 79     Resp 09/08/22 2347 20     Temp 09/08/22 2347 97.7 F (36.5 C)     Temp Source 09/08/22 2347 Oral     SpO2 09/08/22 2306 97 %     Weight 09/08/22 2310 73.5 kg (162 lb)     Height 09/08/22 2310 1.702 m (5\' 7" )     Head Circumference --      Peak Flow --      Pain Score 09/08/22 2310 0     Pain Loc --      Pain Edu? --      Excl. in GC? --     Most recent vital signs: Vitals:   09/09/22 0230 09/09/22 0247  BP: 118/73   Pulse: 82   Resp: 15   Temp:  97.9 F (36.6 C)  SpO2: 99%     General: Awake, generally well-appearing and not in distress. CV:  Good peripheral perfusion.  Regular rate and rhythm, normal heart sounds. Resp:  Normal effort. Speaking easily and comfortably, no accessory muscle usage nor intercostal retractions.  Lungs are clear to auscultation with no wheezing, rales, nor rhonchi. Abd:  No distention.  No tenderness to palpation of the abdomen. Other:  Mood and affect are essentially normal, somewhat anxious, but generally  appropriate under the circumstances.   ED Results / Procedures / Treatments   Labs (all labs ordered are listed, but only abnormal results are displayed) Labs Reviewed  CBC - Abnormal; Notable for the following components:      Result Value   RDW 16.0 (*)    Platelets 100 (*)    All other components within normal limits  BASIC METABOLIC PANEL - Abnormal; Notable for the following components:   CO2 20 (*)    Glucose, Bld 159 (*)    BUN 28 (*)    Creatinine, Ser 1.65 (*)    GFR, Estimated 32 (*)    All other components within normal limits  BRAIN NATRIURETIC PEPTIDE - Abnormal; Notable for the following components:   B Natriuretic Peptide 2,333.9 (*)    All other components within normal limits  TROPONIN I (HIGH SENSITIVITY) - Abnormal; Notable for the following components:   Troponin I (High Sensitivity) 40 (*)    All other components within normal limits     EKG  ED ECG REPORT I, Loleta Roseory Cobie Leidner, the attending physician, personally viewed and interpreted  this ECG.  Date: 09/08/2022 EKG Time: 23: 53 Rate: 83 Rhythm: Sinus rhythm with first-degree AV block and PACs QRS Axis: normal Intervals: Incomplete right bundle branch block ST/T Wave abnormalities: Non-specific ST segment / T-wave changes, but no clear evidence of acute ischemia. Narrative Interpretation: no definitive evidence of acute ischemia; does not meet STEMI criteria.    RADIOLOGY I viewed and interpreted the patient's two-view chest x-ray.  There is no evidence of pneumonia nor pulmonary edema.  Radiology commented on no acute findings, just stable cardiomegaly.   PROCEDURES:  Critical Care performed: No  Procedures    IMPRESSION / MDM / ASSESSMENT AND PLAN / ED COURSE  I reviewed the triage vital signs and the nursing notes.                              Differential diagnosis includes, but is not limited to, pulmonary hypertension, CHF exacerbation, pneumonia,  Patient's presentation is most  consistent with acute presentation with potential threat to life or bodily function.  Labs/studies ordered: EKG, two-view chest x-ray, CBC, BMP, high-sensitivity troponin, BNP  Interventions/Medications given:  Medications - No data to display   (Note:  hospital Course my include additional interventions and/or labs/studies not listed above.)   Patient's vital signs are stable and reassuring.  She is 99 to 100% at all times, even when talking.  Her lungs are clear to auscultation and she is in no respiratory distress and spite of her reported dyspnea on exertion.  I reviewed her past medical history and medical record including her discharge summary written by Dr. Denton Lank of the hospitalist service on 08/28/2022.  This was within the last 2 weeks.  After thorough evaluation, she has no clinical signs of volume overload.  Her BNP is elevated but stable.  No clinical findings of concern on EKG nor chest x-ray.  Labs are otherwise stable as well.  I had a long conversation with the patient and her daughter.  I offered hospitalization and certainly considered it, but it is unclear to me that she would benefit from hospitalization, particularly given her recent stay.  She has close outpatient follow-up available to her with cardiologist.  The patient feels reassured by her workup today and does not want to stay in the hospital.  Her family is supportive of this decision.  I think that is appropriate as well.  She will follow-up with her regular doctor/cardiologist, and I gave strict return precautions as well as recommendations to continue her current medication regimen.    The patient was on the cardiac monitor to evaluate for evidence of arrhythmia and/or significant heart rate changes.       FINAL CLINICAL IMPRESSION(S) / ED DIAGNOSES   Final diagnoses:  Pulmonary hypertension  Chronic congestive heart failure, unspecified heart failure type     Rx / DC Orders   ED Discharge  Orders     None        Note:  This document was prepared using Dragon voice recognition software and may include unintentional dictation errors.   Loleta Rose, MD 09/09/22 [phone]

## 2022-09-13 ENCOUNTER — Telehealth: Payer: Self-pay

## 2022-09-13 NOTE — Progress Notes (Signed)
CCM Notes - ED Protocol  Start: 09/13/22, 11:52:48 AM. Received ER notification regarding pt. DOS: 09/08/22-09/09/22. Facility: [facility]. Symptoms: Worsening SOB x a few weeks. Additional reasons for ER visit: "She has an upcoming follow-up appt with her heart doctor, but wants to be sure that nothing else can be done in the meantime." Diagnoses: Pulmonary HTN and CHF. Pt declined admission, and the physician stated that it was "unclear that she would benefit from hospitalization."  Pt is scheduled to see her cardiologist tomorrow, 4/10, and her PCP on 10/13/22. Reviewed labs from 4/4. BNP: 2,333.9, Troponin: 40 on 4/4, GFR: 32, Platelets: 100.  My call went to VM immediately. Left VM for pt to call back. I did not mention the ER visit or discuss any medical information in my message. I introduced myself/Sushama and stated that I received a notification regarding pt. I explained that I could not provide any medical details in my voicemail message without her permission. I stated that I called to check on her and make sure that she has everything she needs. I provided my contact information and encouraged her to call back. Per [facility] notes, pt is scheduled to see Sushama on 12/01/22 at 2:00pm.  Total time: 20 minutes (non-billable time - no direct patient contact). Anne Ng, LPN.

## 2022-09-13 NOTE — Telephone Encounter (Signed)
ED Discharge  ED Admit Date:: 09/08/2022 ED Discharge Date:: 09/09/2022 ICD Code/Description:: I27 : Pulmonary hypertension, unspecified I50 : Heart failure, unspecified Treatment Location:: [facility] Call Outreach Attempt #1 Date:: 09/12/2022 Time: PM Outcome:: Unsuccessful - Other Outcome Details:: Unable to contact pt today (short-staffed). Attempt #2 Date:: 09/13/2022 Time: AM Outcome:: Unsuccessful - Other Outcome Details:: Unable to reach pt (short-staffed). Attempt #3 Date:: 09/13/2022 Time: PM Outcome:: Unsuccessful - Left message Details: Call went to VM immediately. Left VM for pt to call back. I did not mention the ER visit or discuss any medical information in my message. I introduced myself/Jermond Burkemper and stated that I received a notification regarding pt. I explained that I could not provide any medical details in my voicemail message without her permission. I stated that I called to check on her and make sure that she has everything she needs. I provided my contact information and encouraged her to call back. - JHM, LPN. Is there a longitudinal CRN on the team that has seen the patient since being enrolled?: No Engagement Notes Anne Ng on 09/13/2022 12:09 PM Start: 09/13/22, 11:52:48 AM. Received ER notification regarding pt. DOS: 09/08/22-09/09/22. Facility: [facility]. Symptoms: Worsening SOB x a few weeks. Additional reasons for ER visit: "She has an upcoming follow-up appt with her heart doctor, but wants to be sure that nothing else can be done in the meantime." Diagnoses: Pulmonary HTN and CHF. Pt declined admission, and the physician stated that it was "unclear that she would benefit from hospitalization."  Pt is scheduled to see her cardiologist tomorrow, 4/10, and her PCP on 10/13/22. Reviewed labs from 4/4. BNP: 2,333.9, Troponin: 40 on 4/4, GFR: 32, Platelets: 100.  Pt is scheduled for a visit with [place] [facility] in June. [email or web address], Shanda Bumps  on 09/13/2022 12:08 PM Call went to VM immediately. Left VM for pt to call back. I did not mention the ER visit or discuss any medical information in my message. I introduced myself/Zachery Niswander and stated that I received a notification regarding pt. I explained that I could not provide any medical details in my voicemail message without her permission. I stated that I called to check on her and make sure that she has everything she needs. I provided my contact information and encouraged her to call back. - JHM, LPN. Clinical Lead Review CP/CRN Follow Up ED discharge protocol reviewed and: Clinical Lead Follow-up indicated. Clinical Lead Follow Up Plan: Focused pharmacist call in 1-2 weeks  Lynann Bologna, PharmD 

## 2022-09-14 ENCOUNTER — Other Ambulatory Visit (HOSPITAL_COMMUNITY): Payer: Self-pay

## 2022-09-14 ENCOUNTER — Ambulatory Visit (HOSPITAL_COMMUNITY)
Admission: RE | Admit: 2022-09-14 | Discharge: 2022-09-14 | Disposition: A | Discharge status: Home / Self Care | Payer: Medicare PPO | Source: Ambulatory Visit | Attending: Family Medicine | Admitting: Family Medicine

## 2022-09-14 ENCOUNTER — Telehealth: Payer: Self-pay

## 2022-09-14 ENCOUNTER — Encounter: Payer: Self-pay | Admitting: Oncology

## 2022-09-14 ENCOUNTER — Encounter (HOSPITAL_COMMUNITY): Payer: Self-pay

## 2022-09-14 ENCOUNTER — Telehealth (HOSPITAL_COMMUNITY): Payer: Self-pay | Admitting: Pharmacy Technician

## 2022-09-14 VITALS — BP 122/84 | HR 69 | Wt 160.8 lb

## 2022-09-14 DIAGNOSIS — I2721 Secondary pulmonary arterial hypertension: Secondary | ICD-10-CM | POA: Insufficient documentation

## 2022-09-14 DIAGNOSIS — R197 Diarrhea, unspecified: Secondary | ICD-10-CM | POA: Diagnosis not present

## 2022-09-14 DIAGNOSIS — I5032 Chronic diastolic (congestive) heart failure: Secondary | ICD-10-CM

## 2022-09-14 DIAGNOSIS — K7469 Other cirrhosis of liver: Secondary | ICD-10-CM | POA: Diagnosis not present

## 2022-09-14 DIAGNOSIS — G4733 Obstructive sleep apnea (adult) (pediatric): Secondary | ICD-10-CM

## 2022-09-14 DIAGNOSIS — R519 Headache, unspecified: Secondary | ICD-10-CM | POA: Diagnosis not present

## 2022-09-14 DIAGNOSIS — R700 Elevated erythrocyte sedimentation rate: Secondary | ICD-10-CM | POA: Insufficient documentation

## 2022-09-14 DIAGNOSIS — E1122 Type 2 diabetes mellitus with diabetic chronic kidney disease: Secondary | ICD-10-CM | POA: Insufficient documentation

## 2022-09-14 DIAGNOSIS — I270 Primary pulmonary hypertension: Secondary | ICD-10-CM

## 2022-09-14 DIAGNOSIS — Z79899 Other long term (current) drug therapy: Secondary | ICD-10-CM | POA: Diagnosis not present

## 2022-09-14 DIAGNOSIS — J984 Other disorders of lung: Secondary | ICD-10-CM | POA: Diagnosis not present

## 2022-09-14 DIAGNOSIS — I272 Pulmonary hypertension, unspecified: Secondary | ICD-10-CM

## 2022-09-14 DIAGNOSIS — I129 Hypertensive chronic kidney disease with stage 1 through stage 4 chronic kidney disease, or unspecified chronic kidney disease: Secondary | ICD-10-CM | POA: Insufficient documentation

## 2022-09-14 DIAGNOSIS — N183 Chronic kidney disease, stage 3 unspecified: Secondary | ICD-10-CM | POA: Insufficient documentation

## 2022-09-14 DIAGNOSIS — R0600 Dyspnea, unspecified: Secondary | ICD-10-CM | POA: Diagnosis not present

## 2022-09-14 DIAGNOSIS — R7982 Elevated C-reactive protein (CRP): Secondary | ICD-10-CM | POA: Insufficient documentation

## 2022-09-14 DIAGNOSIS — K7460 Unspecified cirrhosis of liver: Secondary | ICD-10-CM | POA: Insufficient documentation

## 2022-09-14 LAB — BRAIN NATRIURETIC PEPTIDE: B Natriuretic Peptide: 1173.4 pg/mL — ABNORMAL HIGH (ref 0.0–100.0)

## 2022-09-14 LAB — BASIC METABOLIC PANEL
Anion gap: 14 (ref 5–15)
BUN: 35 mg/dL — ABNORMAL HIGH (ref 8–23)
CO2: 19 mmol/L — ABNORMAL LOW (ref 22–32)
Calcium: 9.6 mg/dL (ref 8.9–10.3)
Chloride: 101 mmol/L (ref 98–111)
Creatinine, Ser: 1.69 mg/dL — ABNORMAL HIGH (ref 0.44–1.00)
GFR, Estimated: 31 mL/min — ABNORMAL LOW (ref >60)
Glucose, Bld: 122 mg/dL — ABNORMAL HIGH (ref 70–99)
Potassium: 4.1 mmol/L (ref 3.5–5.1)
Sodium: 134 mmol/L — ABNORMAL LOW (ref 135–145)

## 2022-09-14 LAB — MAGNESIUM: Magnesium: 2.1 mg/dL (ref 1.7–2.4)

## 2022-09-14 NOTE — Patient Instructions (Signed)
It was great to see you today! No medication changes are needed at this time.  Labs today We will only contact you if something comes back abnormal or we need to make some changes. Otherwise no news is good news!  You have been referred to Pulmonary Rehab -they will be in contact with an appointment  Your physician wants you to follow-up in: 3 months with Dr Shirlee Latch. You will receive a reminder letter in the mail two months in advance. If you don't receive a letter, please call our office to schedule the follow-up appointment.   Do the following things EVERYDAY: Weigh yourself in the morning before breakfast. Write it down and keep it in a log. Take your medicines as prescribed Eat low salt foods--Limit salt (sodium) to 2000 mg per day.  Stay as active as you can everyday Limit all fluids for the day to less than 2 liters  At the [facility], you and your health needs are our priority. As part of our continuing mission to provide you with exceptional heart care, we have created designated Provider Care Teams. These Care Teams include your primary Cardiologist (physician) and Advanced Practice Providers (APPs- Physician Assistants and Nurse Practitioners) who all work together to provide you with the care you need, when you need it.   You may see any of the following providers on your designated Care Team at your next follow up: Dr Arvilla Meres Dr Marca Ancona Dr. Marcos Eke, NP Robbie Lis, [place] [facility] [place], [place] Brynda Peon, NP Karle Plumber, PharmD   Please be sure to bring in all your medications bottles to every appointment.    Thank you for choosing [facility]

## 2022-09-14 NOTE — Telephone Encounter (Signed)
        Patient  visited [place] on 4/5     Telephone encounter attempt :  1st  A HIPAA compliant voice message was left requesting a return call.  Instructed patient to call back.    Tarah Buboltz [facility] Guide, [place] [phone] [address], [place], [place] [address] Phone: [phone] Email: [email or web address]        

## 2022-09-14 NOTE — Telephone Encounter (Signed)
        Patient  visited [place] on 4/5    Telephone encounter attempt :2nd  A HIPAA compliant voice message was left requesting a return call.  Instructed patient to call back.    Lenard Forth [facility] Guide, [facility] [phone] [address] [place], [place], [place] [address] Phone: [phone] Email: [email or web address]

## 2022-09-14 NOTE — Telephone Encounter (Signed)
Patient Advocate Encounter   Received notification from [facility] that prior authorization for Orenitram Month 1 - 0.125 & 0.25MG  er is required.   PA submitted on CoverMyMeds Key [id number] Status is pending   Will continue to follow.

## 2022-09-14 NOTE — Progress Notes (Signed)
ReDS Vest / Clip - 09/14/22 1000       ReDS Vest / Clip   Station Marker C    Ruler Value 27.5    ReDS Value Range Low volume    ReDS Actual Value 31

## 2022-09-14 NOTE — Telephone Encounter (Signed)
Advanced [facility] Failure Patient Advocate Encounter  Prior Authorization for Jamie Murray has been approved.    PA# [id number] Effective dates: 06/06/22 through 06/06/23  Will resend referral and new TAF grant information for [facility] to fill medication. Patient and daughter Jamie Murray are aware that they will likely get a call sometime next week to restart medication.

## 2022-09-14 NOTE — Progress Notes (Signed)
PCP: Miki KinsShirley, Amanda M, FNP HF Cardiology: Dr. Shirlee LatchMcLean  77 y.o. with history of HTN, type 2 diabetes, OSA on CPAP, and pulmonary hypertension/RV failure was referred by Dr. Kirke CorinArida for evaluation of pulmonary hypertension. It appears that PH was diagnosed as early as 2018 by echo.  Echo in 2020 showed PA systolic pressure 85 mmHg.  She says that she was told that the pulmonary hypertension was due to OSA.  She has been compliant with CPAP. She reports exertional dyspnea for about 4 years now.   In 8/21, [facility].  Echo showed EF 55-60%, mild LVH, D-shaped septum, severe RV dilation with moderate RV systolic dysfunction, PASP 83, moderate TR, moderate PI, dilated IVC.  RHC/LHC showed no coronary disease, severe pulmonary hypertension with PVR 7.38 and preserved cardiac output.  High resolution CT chest in 9/21 showed no evidence for fibrotic ILD.    She saw rheumatology and was given no definite rheumatological diagnosis.    Echo in 7/22 showed EF 65-70% with moderate LVH, RV moderately dilated with moderately decreased RV systolic function, mild RVH, D-shaped septum, PASP 85 mmHg, moderate PR, normal IVC.  Echo in 9/23 showed EF 55-60%, D-shaped septum, severe RV dilation with severe RV dysfunction, mild TR, PASP 50 mmHg.   She is off Uptravi, unable to tolerate even the lowest dose due to diarrhea and headache.  She tried Tyvaso DPI; she developed headaches and did not like inhaling and found the device hard to manipulate so she stopped it.  She is not interested in trying the inhaler form of Tyvaso.  At a prior appointment, I offered her a trial of orenitram but she decided not to take it given the side effects she had with Uptravi.    Follow up 3/24, volume overloaded with 12 lbs weight gain. Lasix increased to 40 bid x 4 days then back to 40/20. Plan to trial orenitram after diuresis.  Admitted 3/24 w/ a/c dHF, no improvement after increase in Lasix. Diuresed with IV lasix,  eventually transitioned to po Lasix 40 bid and discharged home, weight 168.5 lbs. Seen in ED 09/09/22 with dyspnea. BNP >2k, but felt she was stable for discharge, with close HF follow up.   Today she returns for RV failure/pulmonary hypertension follow up. Overall feeling fatigued. [facility] She remains SOB with ADLs, she uses a cane when walking around the house. Had diarrhea x 3 weeks, has since resolved, but continues to feel drained with poor energy. She has positional dizziness, no falls. Denies palpitations, abnormal bleeding, CP, edema, or PND/Orthopnea. Appetite ok. No fever or chills. Weight at home 159 pounds. Taking all medications. Diet high in sodium, she does not cook since her husband passed. She is staying with her daughter this past week until she gets her strength up.  REDs: 31%  ECG (personally reviewed): none ordered today.  Labs (8/21): K 3.3, creatinine 1.39, ANA negative, anti-Sm negative, anti-SCL-70 negative, ANCA negative, RF elevated 52 but CCP negative, ESR 67, CRP 2.2 Labs (9/21): K 4.5, creatinine 1.8 => 1.45, anti-centromere Ab negative, BNP 309 Labs (11/21): K 4.1, creatinine 1.47, BNP 590 Labs (1/22): LDL 55, K 3.8, creatinine 1.611.49 Labs (5/22): LDL 74, K 4, creatinine 1.5 Labs (9/22): K 3.8, creatinine 1.43 Labs (12/22): LDL 54, K 3.8, creatinine 1.3 Labs (2/23): BNP 197, K 3.4, creatinine 1.3 Labs (6/23): BNP 862 Labs (9/23): K 4, creatinine 1.87 Labs (2/24): K 3.9, creatinine 1.39, LFTs normal Labs (4/24):  K 3.5, creatinine 1.65  6 minute walk (8/21): 154 m 6 minute walk (11/21): 183 m 6 minute walk (3/22): 351 m 6 minute walk (11/22): 381 m 6 minute walk (2/23): 290 m (off Uptravi)  PMH: 1. HTN 2. Type 2 diabetes 3. OSA: Uses CPAP.  4. GERD 5. Hypothyroidism 6. Pulmonary hypertension/RV failure: Pulmonary hypertension diagnosed 2018 by echo.  - PFTs (6/18): moderate restriction - Echo 2020 with PASP 85 mmHg.  - Echo (8/21): EF  55-60%, mild LVH, D-shaped septum, severe RV dilation with moderate RV systolic dysfunction, PASP 83, moderate TR, moderate PI, dilated IVC.  - LHC/RHC (8/21): Normal coronaries; mean RA 3, PA 82/21 mean 44, mean PCWP 14, CI 2.2, PVR 7.38 WU.  - V/Q scan (8/21): No evidence for chronic PE.  - Serologic workup: ANA negative, anti-Sm negative, anti-SCL-70 negative, ANCA negative, RF elevated 52 but CCP negative, ESR 67, CRP 2.2, anti-centromere negative - High resolution CT chest: No fibrotic ILD.  - Echo (7/22): EF 65-70% with moderate LVH, RV moderately dilated with moderately decreased RV systolic function, mild RVH, D-shaped septum, PASP 85 mmHg, moderate PR, normal IVC.  - Echo (9/23): EF 55-60%, D-shaped septum, severe RV dilation with severe RV dysfunction, mild TR, PASP 50 mmHg. 7. CKD stage 3.  8. Cirrhosis: Noted on 9/23 CT abdomen/pelvis. Suspect cardiogenic.   Social History   Socioeconomic History   Marital status: Widowed    Spouse name: Chanetta Marshall   Number of children: 3   Years of education: 14   Highest education level: Not on file  Occupational History    Comment: work part time, day care   Occupation: Retired  Tobacco Use   Smoking status: Never   Smokeless tobacco: Never  Vaping Use   Vaping Use: Never used  Substance and Sexual Activity   Alcohol use: No    Alcohol/week: 0.0 standard drinks of alcohol   Drug use: No   Sexual activity: Not Currently    Birth control/protection: None, Post-menopausal  Other Topics Concern   Not on file  Social History Narrative   Caffeine use- coffee 2 cups daily, Coke once a week, green tea daily   Social Determinants of Health   Financial Resource Strain: Not on file  Food Insecurity: No Food Insecurity (08/27/2022)   Hunger Vital Sign    Worried About Running Out of Food in the Last Year: Never true    Ran Out of Food in the Last Year: Never true  Transportation Needs: No Transportation Needs (08/27/2022)   PRAPARE -  Administrator, Civil Service (Medical): No    Lack of Transportation (Non-Medical): No  Physical Activity: Not on file  Stress: Not on file  Social Connections: Not on file  Intimate Partner Violence: Not At Risk (08/27/2022)   Humiliation, Afraid, Rape, and Kick questionnaire    Fear of Current or Ex-Partner: No    Emotionally Abused: No    Physically Abused: No    Sexually Abused: No   Family History  Problem Relation Age of Onset   Diabetes Mother    Hypertension Mother    Stroke Mother    Heart disease Mother        MI   Cancer Father        pancreatic   Stroke Father    Hypertension Sister    Diabetes Brother    Diabetes Maternal Grandfather    Diabetes Sister    Diabetes Sister    Diabetes Brother  Diabetes Brother    ROS: All systems reviewed and negative except as per HPI.   Current Outpatient Medications  Medication Sig Dispense Refill   ACCU-CHEK GUIDE test strip 3 each by Other route 3 (three) times daily as needed for other (low blood sugar symptoms). 100 each 6   ambrisentan (LETAIRIS) 10 MG tablet TAKE 1 TABLET BY MOUTH EVERY DAY 30 tablet 3   amLODipine (NORVASC) 10 MG tablet Take 1 tablet (10 mg total) by mouth daily. 90 tablet 3   aspirin 81 MG tablet Take 81 mg by mouth daily.     Cholecalciferol (VITAMIN D3) 125 MCG (5000 UT) CAPS Take 1 capsule by mouth daily.     clonazePAM (KLONOPIN) 1 MG tablet TAKE 1 TABLET BY MOUTH UP TO TWICE DAILY AS NEEDED FOR ANXIETY 60 tablet 1   dapagliflozin propanediol (FARXIGA) 10 MG TABS tablet Take 1 tablet (10 mg total) by mouth daily before breakfast. 90 tablet 3   escitalopram (LEXAPRO) 10 MG tablet Take 10 mg by mouth daily.     esomeprazole (NEXIUM) 40 MG capsule Take 40 mg by mouth daily at 12 noon.     Fe Fum-FA-B Cmp-C-Zn-Mg-Mn-Cu (HEMOCYTE PLUS) 106-1 MG CAPS Take 1 capsule by mouth daily.  6   furosemide (LASIX) 20 MG tablet Take 2 tablets (40 mg total) by mouth 2 (two) times daily. 90 tablet 0    hydrALAZINE (APRESOLINE) 50 MG tablet Take 50 mg by mouth 2 (two) times daily.     insulin aspart (NOVOLOG) 100 UNIT/ML injection Inject 4 Units into the skin. Bs greater than 200     Insulin Degludec (TRESIBA [place]) Inject 16 Units into the skin daily in the afternoon.     Insulin Pen Needle (B-D UF III MINI PEN NEEDLES) 31G X 5 MM MISC 4 (four) times daily     levothyroxine (SYNTHROID) 75 MCG tablet TAKE 1 TABLET BY MOUTH EVERY MORNING ON AN EMPTY STOMACH 90 tablet 1   loratadine (CLARITIN) 10 MG tablet Take 10 mg by mouth daily.      meclizine (ANTIVERT) 25 MG tablet Take 25 mg by mouth 3 (three) times daily as needed for dizziness.      metoprolol succinate (TOPROL-XL) 50 MG 24 hr tablet Take 50 mg by mouth daily. Take with or immediately following a meal.     Multiple Vitamins-Minerals (MULTI ADULT GUMMIES) CHEW Chew by mouth daily.     potassium chloride SA (KLOR-CON M) 20 MEQ tablet Take 1 tablet (20 mEq total) by mouth daily. 30 tablet 3   rosuvastatin (CRESTOR) 10 MG tablet Take 10 mg by mouth daily.     tadalafil, PAH, (ADCIRCA) 20 MG tablet Take 2 tablets (40 mg total) by mouth daily. 60 tablet 11   No current facility-administered medications for this encounter.   Wt Readings from Last 3 Encounters:  09/14/22 72.9 kg (160 lb 12.8 oz)  09/08/22 73.5 kg (162 lb)  08/28/22 76.6 kg (168 lb 12.8 oz)   BP 122/84   Pulse 69   Wt 72.9 kg (160 lb 12.8 oz)   LMP  (LMP Unknown)   SpO2 99%   BMI 25.18 kg/m  Physical Exam General:  NAD. No resp difficulty HEENT: Normal Neck: Supple. No JVD. Carotids 2+ bilat; no bruits. No lymphadenopathy or thryomegaly appreciated. Cor: PMI nondisplaced. Regular rate & rhythm. No rubs, gallops or murmurs. Lungs: Clear Abdomen: Soft, nontender, nondistended. No hepatosplenomegaly. No bruits or masses. Good bowel sounds. Extremities: No cyanosis, clubbing,  rash, edema Neuro: Alert & oriented x 3, cranial nerves grossly intact. Moves all 4 extremities  w/o difficulty. Affect pleasant.  Assessment/Plan: 1. HTN: BP controlled.  - Continue amlodipine.  2. OSA: Continue CPAP.    3. Pulmonary HTN/RV failure: Echo in 8/21 with EF 55-60%, mild LVH, D-shaped septum, severe RV dilation with moderate RV systolic dysfunction, PASP 83, moderate TR, moderate PI, dilated IVC.  Suspect long-standing RV dysfunction based on appearance of echo. RHC in 8/21 with severe pulmonary arterial hypertension, preserved cardiac output. PFTs from 6/18 showed moderate restriction.  V/Q scan from 8/21 showed no evidence for chronic PE. She has treated OSA.  Serologic workup was negative except for elevated CRP and ESR and elevated RF (normal CCP).  High resolution CT chest did not show evidence for ILD. I suspect long-standing group 1 PAH. Repeat echo in 7/22 showed EF 65-70% with moderate LVH, RV moderately dilated with moderately decreased RV systolic function, mild RVH, D-shaped septum, PASP 85 mmHg, moderate PR, normal IVC. Though echo still showed significant RV dysfunction, she symptomatically has felt better on PH meds.  She had to stop Uptravi due to intractable diarrhea and headache.  She was unable to use Tyvaso.  Echo in 9/23 showed EF 55-60%, D-shaped septum, severe RV dilation with severe RV dysfunction, mild TR, PASP 50 mmHg.  She had 1 admission for volume overload, and another recent ED visit for dyspnea. Today, she has NYHA II-III symptoms. She is not volume overloaded on exam, weight down 12 lbs. REDs 31%. Fatigue is main limiter. - Defer 6 minute walk as she is too fatigued   - Refer to Pulmonary Rehab. - Continue Lasix 40 mg bid. BMET and BNP today. - Continue Farxiga 10 mg daily.  - Continue ambrisentan 10.  - Continue tadalafil 40 mg daily.   - Unable to tolerate Uptravi but when she was on it, she had much better 6 minute walk and improved breathing. She could not handle Tyvaso DPI and does not want to try to the Tyvaso inhaler. She is willing to try  orenitram.  Will arrange for her to start at a low dose and titrate up now that her volume status has improved.  I notified pharmacy team. - Continue CPAP (OSA likely contributes but certainly cannot explain the extent of her PH).  4. Pulmonary insufficiency: Moderate on 9/23 echo.   5. Cirrhosis: Noted on imaging. Likely due  to RV failure and passive congestion, possible NAFLD.  6. Fatigue: likely multifactorial. She recently restarted her oral iron supplement.  - Continue CPAP - Arrange Pulmonary Rehab, as above.  Followup in 3 months with Dr. Shirlee Latch.  [facility] FNP-BC 09/14/2022

## 2022-09-15 ENCOUNTER — Other Ambulatory Visit: Payer: Self-pay | Admitting: Family

## 2022-09-16 ENCOUNTER — Other Ambulatory Visit (HOSPITAL_COMMUNITY): Payer: Self-pay

## 2022-09-16 ENCOUNTER — Telehealth (HOSPITAL_COMMUNITY): Payer: Self-pay | Admitting: Pharmacy Technician

## 2022-09-16 ENCOUNTER — Encounter (HOSPITAL_COMMUNITY): Payer: Medicare PPO

## 2022-09-16 NOTE — Telephone Encounter (Signed)
Advanced Heart Failure Patient Advocate Encounter  The patient was approved for a grant through [employer] that will help cover the cost of Orenitram, Ambrisentan. The grant will cover the medication regardless of cost. Eligibility, 06/06/21 - 06/06/23.  ID [id number]  BIN [id number]  PCN [id number]  Group [id number]  Billing information added to referral.  Archer Asa, CPhT

## 2022-09-16 NOTE — Telephone Encounter (Signed)
Advanced Heart Failure Patient Advocate Encounter  Sent in [place] referral along with PA information to [facility]. [facility] will be dispensing the medication after the referral process is complete.   Will follow up.

## 2022-09-21 ENCOUNTER — Telehealth: Payer: Self-pay | Admitting: Family

## 2022-09-21 NOTE — Telephone Encounter (Signed)
Patient's daughter, Kasandra Knudsen, called stating that the patient has been in the hospital twice in the last month and needs to schedule a hospital follow up and also needs to get back into pulmonology rehab.   [phone]

## 2022-09-24 ENCOUNTER — Other Ambulatory Visit: Payer: Self-pay | Admitting: Family

## 2022-09-28 NOTE — Telephone Encounter (Signed)
Advanced Heart Failure Patient Advocate Encounter  Asked for a referral update for this patient. According to [facility], on 04/22 the patient's daughter wanted to schedule shipment of the medication and nursing. She asked that [facility] call the patient first to discuss. [facility] was unable to reach the patient and left a message updating her daughter.

## 2022-09-29 ENCOUNTER — Ambulatory Visit: Payer: Medicare PPO | Admitting: Family

## 2022-09-29 ENCOUNTER — Encounter: Payer: Self-pay | Admitting: Family

## 2022-09-29 VITALS — BP 122/72 | HR 56 | Ht 67.0 in | Wt 162.4 lb

## 2022-09-29 DIAGNOSIS — J449 Chronic obstructive pulmonary disease, unspecified: Secondary | ICD-10-CM | POA: Diagnosis not present

## 2022-09-29 DIAGNOSIS — E1165 Type 2 diabetes mellitus with hyperglycemia: Secondary | ICD-10-CM | POA: Diagnosis not present

## 2022-09-29 DIAGNOSIS — I5032 Chronic diastolic (congestive) heart failure: Secondary | ICD-10-CM | POA: Diagnosis not present

## 2022-09-29 DIAGNOSIS — I270 Primary pulmonary hypertension: Secondary | ICD-10-CM

## 2022-09-29 LAB — POCT CBG (FASTING - GLUCOSE)-MANUAL ENTRY: Glucose Fasting, POC: 112 mg/dL — AB (ref 70–99)

## 2022-09-29 NOTE — Progress Notes (Signed)
Established Patient Office Visit  Subjective:  Patient ID: Jamie Murray, female    DOB: 02-02-1946  Age: 77 y.o. MRN: [id number]  Chief Complaint  Patient presents with   Follow-up    Hospital follow up    Patient here today for hospital follow up.  She is feeling much better than she was while she was at the hospital, but she does have a few concerns today.   She needs physical therapy to help with her strengthening and re-conditioning.  She also needs someone to come in and help her with household chores.   Finally, she says that she would like to have the respiratory therapist out to help her with breathing exercises. She also says that she has been having some shortness of breath, and would like to have oxygen available if possible.    No other concerns at this time.   Past Medical History:  Diagnosis Date   Allergy    Anemia    Anxiety    Chest pain, rule out acute myocardial infarction 08/09/2016   COPD (chronic obstructive pulmonary disease) (HCC)    Depression    Diabetes (HCC)    Diarrhea, functional 01/25/2016   Edema of lower extremity 02/03/2020   Elevated troponin level 01/09/2020   GERD (gastroesophageal reflux disease)    Hyperlipidemia    Hypertension    Hypokalemia 12/22/2015   Hypomagnesemia 03/22/2016   Insomnia    Macrocytosis 01/25/2016   Migraines    Osteoporosis    Rocky [place] spotted fever    Transient cerebral ischemia    Vertigo    every 2-3 months    Past Surgical History:  Procedure Laterality Date   ABDOMINAL HYSTERECTOMY     CORONARY ANGIOPLASTY     ESOPHAGOGASTRODUODENOSCOPY (EGD) WITH PROPOFOL N/A 08/24/2015   Procedure: ESOPHAGOGASTRODUODENOSCOPY (EGD) WITH PROPOFOL with dialation;  Surgeon: Midge Minium, MD;  Location: [facility];  Service: Endoscopy;  Laterality: N/A;  Diabetic - oral meds   RIGHT/LEFT HEART CATH AND CORONARY/GRAFT ANGIOGRAPHY N/A 01/13/2020   Procedure: RIGHT/LEFT HEART CATH AND  CORONARY/GRAFT ANGIOGRAPHY;  Surgeon: Iran Ouch, MD;  Location: [facility];  Service: Cardiovascular;  Laterality: N/A;   TUBAL LIGATION      Social History   Socioeconomic History   Marital status: Widowed    Spouse name: Chanetta Marshall   Number of children: 3   Years of education: 14   Highest education level: Not on file  Occupational History    Comment: work part time, day care   Occupation: Retired  Tobacco Use   Smoking status: Never   Smokeless tobacco: Never  Vaping Use   Vaping Use: Never used  Substance and Sexual Activity   Alcohol use: No    Alcohol/week: 0.0 standard drinks of alcohol   Drug use: No   Sexual activity: Not Currently    Birth control/protection: None, Post-menopausal  Other Topics Concern   Not on file  Social History Narrative   Caffeine use- coffee 2 cups daily, Coke once a week, green tea daily   Social Determinants of Health   Financial Resource Strain: Not on file  Food Insecurity: No Food Insecurity (08/27/2022)   Hunger Vital Sign    Worried About Running Out of Food in the Last Year: Never true    Ran Out of Food in the Last Year: Never true  Transportation Needs: No Transportation Needs (08/27/2022)   PRAPARE - Administrator, Civil Service (Medical): No  Lack of Transportation (Non-Medical): No  Physical Activity: Not on file  Stress: Not on file  Social Connections: Not on file  Intimate Partner Violence: Not At Risk (08/27/2022)   Humiliation, Afraid, Rape, and Kick questionnaire    Fear of Current or Ex-Partner: No    Emotionally Abused: No    Physically Abused: No    Sexually Abused: No    Family History  Problem Relation Age of Onset   Diabetes Mother    Hypertension Mother    Stroke Mother    Heart disease Mother        MI   Cancer Father        pancreatic   Stroke Father    Hypertension Sister    Diabetes Brother    Diabetes Maternal Grandfather    Diabetes Sister    Diabetes Sister     Diabetes Brother    Diabetes Brother     Allergies  Allergen Reactions   Atorvastatin     Note: joint pain   Fluoxetine Other (See Comments)    "felt crazy"   Levemir [Insulin Detemir] Other (See Comments)    Bruising   Prozac [Fluoxetine Hcl] Other (See Comments)    "felt crazy"    Review of Systems  Constitutional:  Positive for malaise/fatigue.  Respiratory:  Positive for shortness of breath and wheezing.   Neurological:  Positive for dizziness and weakness.  Psychiatric/Behavioral:  The patient is nervous/anxious.   All other systems reviewed and are negative.      Objective:   BP 122/72   Pulse (!) 56   Ht 5\' 7"  (1.702 m)   Wt 162 lb 6.4 oz (73.7 kg)   LMP  (LMP Unknown)   SpO2 95%   BMI 25.44 kg/m   Vitals:   09/29/22 1129  BP: 122/72  Pulse: (!) 56  Height: 5\' 7"  (1.702 m)  Weight: 162 lb 6.4 oz (73.7 kg)  SpO2: 95%  BMI (Calculated): 25.43    Physical Exam Vitals and nursing note reviewed.  Constitutional:      Appearance: Normal appearance. She is normal weight.  HENT:     Head: Normocephalic.  Eyes:     Extraocular Movements: Extraocular movements intact.     Conjunctiva/sclera: Conjunctivae normal.     Pupils: Pupils are equal, round, and reactive to light.  Cardiovascular:     Rate and Rhythm: Normal rate.     Heart sounds: Murmur heard.  Pulmonary:     Breath sounds: Wheezing present.  Neurological:     General: No focal deficit present.     Mental Status: She is alert. Mental status is at baseline.  Psychiatric:        Mood and Affect: Mood normal.        Behavior: Behavior normal.        Thought Content: Thought content normal.        Judgment: Judgment normal.      Results for orders placed or performed in visit on 09/29/22  POCT CBG (Fasting - Glucose)  Result Value Ref Range   Glucose Fasting, POC 112 (A) 70 - 99 mg/dL    Recent Results (from the past 2160 hour(s))  POCT CBG (Fasting - Glucose)     Status: Abnormal    Collection Time: 07/15/22 11:23 AM  Result Value Ref Range   Glucose Fasting, POC 129 (A) 70 - 99 mg/dL  Lipid panel     Status: None   Collection Time: 07/15/22 12:29  PM  Result Value Ref Range   Cholesterol, Total 131 100 - 199 mg/dL   Triglycerides 161 0 - 149 mg/dL   HDL 42 >09 mg/dL   VLDL Cholesterol Cal 19 5 - 40 mg/dL   LDL Chol Calc (NIH) 70 0 - 99 mg/dL   Chol/HDL Ratio 3.1 0.0 - 4.4 ratio    Comment:                                   T. Chol/HDL Ratio                                             Men  Women                               1/2 Avg.Risk  3.4    3.3                                   Avg.Risk  5.0    4.4                                2X Avg.Risk  9.6    7.1                                3X Avg.Risk 23.4   11.0   VITAMIN D 25 Hydroxy (Vit-D Deficiency, Fractures)     Status: None   Collection Time: 07/15/22 12:29 PM  Result Value Ref Range   Vit D, 25-Hydroxy 45.2 30.0 - 100.0 ng/mL    Comment: Vitamin D deficiency has been defined by the Institute of Medicine and an Endocrine Society practice guideline as a level of serum 25-OH vitamin D less than 20 ng/mL (1,2). The Endocrine Society went on to further define vitamin D insufficiency as a level between 21 and 29 ng/mL (2). 1. IOM (Institute of Medicine). 2010. Dietary reference    intakes for calcium and D. [place]: The    [employer]. 2. Holick MF, Binkley [place], Bischoff-Ferrari HA, et al.    Evaluation, treatment, and prevention of vitamin D    deficiency: an Endocrine Society clinical practice    guideline. JCEM. 2011 Jul; 96(7):1911-30.   CBC With Differential     Status: None   Collection Time: 07/15/22 12:29 PM  Result Value Ref Range   WBC 3.6 3.4 - 10.8 x10E3/uL   RBC 3.96 3.77 - 5.28 x10E6/uL   Hemoglobin 11.8 11.1 - 15.9 g/dL   Hematocrit 60.4 54.0 - 46.6 %   MCV 89 79 - 97 fL   MCH 29.8 26.6 - 33.0 pg   MCHC 33.4 31.5 - 35.7 g/dL   RDW 98.1 19.1 - 47.8 %   Neutrophils 57  Not Estab. %   Lymphs 36 Not Estab. %   Monocytes 5 Not Estab. %   Eos 2 Not Estab. %   Basos 0 Not Estab. %   Neutrophils Absolute 2.1 1.4 - 7.0 x10E3/uL   Lymphocytes Absolute 1.3 0.7 - 3.1 x10E3/uL   Monocytes Absolute 0.2  0.1 - 0.9 x10E3/uL   EOS (ABSOLUTE) 0.1 0.0 - 0.4 x10E3/uL   Basophils Absolute 0.0 0.0 - 0.2 x10E3/uL   Immature Granulocytes 0 Not Estab. %   Immature Grans (Abs) 0.0 0.0 - 0.1 x10E3/uL  CMP14+EGFR     Status: Abnormal   Collection Time: 07/15/22 12:29 PM  Result Value Ref Range   Glucose 106 (H) 70 - 99 mg/dL   BUN 17 8 - 27 mg/dL   Creatinine, Ser 1.61 (H) 0.57 - 1.00 mg/dL   eGFR 40 (L) >09 UE/AVW/0.98   BUN/Creatinine Ratio 12 12 - 28   Sodium 144 134 - 144 mmol/L   Potassium 3.9 3.5 - 5.2 mmol/L   Chloride 108 (H) 96 - 106 mmol/L   CO2 20 20 - 29 mmol/L   Calcium 9.0 8.7 - 10.3 mg/dL   Total Protein 6.6 6.0 - 8.5 g/dL   Albumin 4.2 3.8 - 4.8 g/dL   Globulin, Total 2.4 1.5 - 4.5 g/dL   Albumin/Globulin Ratio 1.8 1.2 - 2.2   Bilirubin Total 0.2 0.0 - 1.2 mg/dL   Alkaline Phosphatase 83 44 - 121 IU/L   AST 16 0 - 40 IU/L   ALT 10 0 - 32 IU/L  TSH     Status: None   Collection Time: 07/15/22 12:29 PM  Result Value Ref Range   TSH 3.150 0.450 - 4.500 uIU/mL  Hemoglobin A1c     Status: Abnormal   Collection Time: 07/15/22 12:29 PM  Result Value Ref Range   Hgb A1c MFr Bld 6.3 (H) 4.8 - 5.6 %    Comment:          Prediabetes: 5.7 - 6.4          Diabetes: >6.4          Glycemic control for adults with diabetes: <7.0    Est. average glucose Bld gHb Est-mCnc 134 mg/dL  B Nat Peptide     Status: Abnormal   Collection Time: 08/24/22 11:11 AM  Result Value Ref Range   B Natriuretic Peptide 2,366.2 (H) 0.0 - 100.0 pg/mL    Comment: Performed at [facility], [address], [place], [place] [address]  Basic Metabolic Panel (BMET)     Status: Abnormal   Collection Time: 08/24/22 11:11 AM  Result Value Ref Range   Sodium 143 135 - 145 mmol/L    Potassium 4.1 3.5 - 5.1 mmol/L   Chloride 112 (H) 98 - 111 mmol/L   CO2 22 22 - 32 mmol/L   Glucose, Bld 146 (H) 70 - 99 mg/dL    Comment: Glucose reference range applies only to samples taken after fasting for at least 8 hours.   BUN 33 (H) 8 - 23 mg/dL   Creatinine, Ser 7.82 (H) 0.44 - 1.00 mg/dL   Calcium 8.8 (L) 8.9 - 10.3 mg/dL   GFR, Estimated 28 (L) >60 mL/min    Comment: (NOTE) Calculated using the CKD-EPI Creatinine Equation (2021)    Anion gap 9 5 - 15    Comment: Performed at [facility], [address], [place], [place] [address]  CBC with Differential     Status: Abnormal   Collection Time: 08/26/22 11:05 AM  Result Value Ref Range   WBC 4.3 4.0 - 10.5 K/uL   RBC 4.26 3.87 - 5.11 MIL/uL   Hemoglobin 12.1 12.0 - 15.0 g/dL   HCT 30.8 65.7 - 84.6 %   MCV 94.1 80.0 - 100.0 fL  MCH 28.4 26.0 - 34.0 pg   MCHC 30.2 30.0 - 36.0 g/dL   RDW 16.1 (H) 09.6 - 04.5 %   Platelets 173 150 - 400 K/uL   nRBC 0.0 0.0 - 0.2 %   Neutrophils Relative % 54 %   Neutro Abs 2.3 1.7 - 7.7 K/uL   Lymphocytes Relative 38 %   Lymphs Abs 1.6 0.7 - 4.0 K/uL   Monocytes Relative 6 %   Monocytes Absolute 0.3 0.1 - 1.0 K/uL   Eosinophils Relative 1 %   Eosinophils Absolute 0.1 0.0 - 0.5 K/uL   Basophils Relative 1 %   Basophils Absolute 0.0 0.0 - 0.1 K/uL   Immature Granulocytes 0 %   Abs Immature Granulocytes 0.01 0.00 - 0.07 K/uL    Comment: Performed at [facility], [address] [place], [place] [address]  Comprehensive metabolic panel     Status: Abnormal   Collection Time: 08/26/22 11:05 AM  Result Value Ref Range   Sodium 140 135 - 145 mmol/L   Potassium 3.8 3.5 - 5.1 mmol/L   Chloride 106 98 - 111 mmol/L   CO2 22 22 - 32 mmol/L   Glucose, Bld 115 (H) 70 - 99 mg/dL    Comment: Glucose reference range applies only to samples taken after fasting for at least 8 hours.   BUN 32 (H) 8 - 23 mg/dL   Creatinine, Ser 1.91 (H) 0.44 - 1.00 mg/dL   Calcium 9.0 8.9 - 47.8  mg/dL   Total Protein 6.8 6.5 - 8.1 g/dL   Albumin 3.7 3.5 - 5.0 g/dL   AST 27 15 - 41 U/L   ALT 21 0 - 44 U/L   Alkaline Phosphatase 70 38 - 126 U/L   Total Bilirubin 1.0 0.3 - 1.2 mg/dL   GFR, Estimated 29 (L) >60 mL/min    Comment: (NOTE) Calculated using the CKD-EPI Creatinine Equation (2021)    Anion gap 12 5 - 15    Comment: Performed at [facility], [address], [place], [place] [address]  Troponin I (High Sensitivity)     Status: Abnormal   Collection Time: 08/26/22 11:05 AM  Result Value Ref Range   Troponin I (High Sensitivity) 26 (H) <18 ng/L    Comment: (NOTE) Elevated high sensitivity troponin I (hsTnI) values and significant  changes across serial measurements may suggest ACS but many other  chronic and acute conditions are known to elevate hsTnI results.  Refer to the "Links" section for chest pain algorithms and additional  guidance. Performed at [facility], [address] [place], [place] [address]   Troponin I (High Sensitivity)     Status: Abnormal   Collection Time: 08/26/22  1:00 PM  Result Value Ref Range   Troponin I (High Sensitivity) 25 (H) <18 ng/L    Comment: (NOTE) Elevated high sensitivity troponin I (hsTnI) values and significant  changes across serial measurements may suggest ACS but many other  chronic and acute conditions are known to elevate hsTnI results.  Refer to the "Links" section for chest pain algorithms and additional  guidance. Performed at [facility], [address] [place], [place] [address]   CBG monitoring, ED     Status: Abnormal   Collection Time: 08/26/22  4:27 PM  Result Value Ref Range   Glucose-Capillary 139 (H) 70 - 99 mg/dL    Comment: Glucose reference range applies only to samples taken after fasting for at least 8 hours.  CBG  monitoring, ED     Status: None   Collection Time: 08/26/22  9:23 PM  Result Value Ref Range   Glucose-Capillary 75 70 - 99 mg/dL    Comment: Glucose  reference range applies only to samples taken after fasting for at least 8 hours.  Basic metabolic panel     Status: Abnormal   Collection Time: 08/27/22  4:09 AM  Result Value Ref Range   Sodium 137 135 - 145 mmol/L   Potassium 3.0 (L) 3.5 - 5.1 mmol/L   Chloride 106 98 - 111 mmol/L   CO2 22 22 - 32 mmol/L   Glucose, Bld 101 (H) 70 - 99 mg/dL    Comment: Glucose reference range applies only to samples taken after fasting for at least 8 hours.   BUN 38 (H) 8 - 23 mg/dL   Creatinine, Ser 4.09 (H) 0.44 - 1.00 mg/dL   Calcium 8.8 (L) 8.9 - 10.3 mg/dL   GFR, Estimated 28 (L) >60 mL/min    Comment: (NOTE) Calculated using the CKD-EPI Creatinine Equation (2021)    Anion gap 9 5 - 15    Comment: Performed at [facility], [address] [place], [place] [address]  Magnesium     Status: None   Collection Time: 08/27/22  4:09 AM  Result Value Ref Range   Magnesium 2.0 1.7 - 2.4 mg/dL    Comment: Performed at [facility], [address] [place], [place] [address]  Glucose, capillary     Status: None   Collection Time: 08/27/22  8:18 AM  Result Value Ref Range   Glucose-Capillary 76 70 - 99 mg/dL    Comment: Glucose reference range applies only to samples taken after fasting for at least 8 hours.  ECHOCARDIOGRAM COMPLETE     Status: None   Collection Time: 08/27/22 10:56 AM  Result Value Ref Range   Weight 2,734.4 oz   Height 67 in   BP 114/60 mmHg   Ao pk vel 1.50 m/s   AV Area VTI 1.48 cm2   AR max vel 1.59 cm2   AV Mean grad 4.5 mmHg   AV Peak grad 8.9 mmHg   Single Plane A2C EF 71.2 %   Single Plane A4C EF 84.5 %   Calc EF 78.4 %   S' Lateral 1.10 cm   AV Area mean vel 1.57 cm2   Area-P 1/2 2.93 cm2   Est EF 55 - 60%   Glucose, capillary     Status: Abnormal   Collection Time: 08/27/22 11:33 AM  Result Value Ref Range   Glucose-Capillary 152 (H) 70 - 99 mg/dL    Comment: Glucose reference range applies only to samples taken after fasting for at least  8 hours.  Glucose, capillary     Status: Abnormal   Collection Time: 08/27/22  4:41 PM  Result Value Ref Range   Glucose-Capillary 172 (H) 70 - 99 mg/dL    Comment: Glucose reference range applies only to samples taken after fasting for at least 8 hours.  Glucose, capillary     Status: Abnormal   Collection Time: 08/27/22  9:06 PM  Result Value Ref Range   Glucose-Capillary 156 (H) 70 - 99 mg/dL    Comment: Glucose reference range applies only to samples taken after fasting for at least 8 hours.  Basic metabolic panel     Status: Abnormal   Collection Time: 08/28/22  3:48 AM  Result Value Ref Range   Sodium 137 135 -  145 mmol/L   Potassium 3.6 3.5 - 5.1 mmol/L   Chloride 101 98 - 111 mmol/L   CO2 23 22 - 32 mmol/L   Glucose, Bld 116 (H) 70 - 99 mg/dL    Comment: Glucose reference range applies only to samples taken after fasting for at least 8 hours.   BUN 37 (H) 8 - 23 mg/dL   Creatinine, Ser 1.61 (H) 0.44 - 1.00 mg/dL   Calcium 9.1 8.9 - 09.6 mg/dL   GFR, Estimated 28 (L) >60 mL/min    Comment: (NOTE) Calculated using the CKD-EPI Creatinine Equation (2021)    Anion gap 13 5 - 15    Comment: Performed at [facility], [address] [place], [place] [address]  Magnesium     Status: None   Collection Time: 08/28/22  3:48 AM  Result Value Ref Range   Magnesium 1.9 1.7 - 2.4 mg/dL    Comment: Performed at [facility], [address] [place], [place] [address]  Glucose, capillary     Status: Abnormal   Collection Time: 08/28/22  8:40 AM  Result Value Ref Range   Glucose-Capillary 106 (H) 70 - 99 mg/dL    Comment: Glucose reference range applies only to samples taken after fasting for at least 8 hours.  Basic Metabolic Panel (BMET)     Status: Abnormal   Collection Time: 09/06/22 10:24 AM  Result Value Ref Range   Sodium 141 135 - 145 mmol/L   Potassium 3.5 3.5 - 5.1 mmol/L   Chloride 105 98 - 111 mmol/L   CO2 26 22 - 32 mmol/L   Glucose, Bld 180 (H)  70 - 99 mg/dL    Comment: Glucose reference range applies only to samples taken after fasting for at least 8 hours.   BUN 24 (H) 8 - 23 mg/dL   Creatinine, Ser 1.47 (H) 0.44 - 1.00 mg/dL   Calcium 9.2 8.9 - 82.9 mg/dL   GFR, Estimated 36 (L) >60 mL/min    Comment: (NOTE) Calculated using the CKD-EPI Creatinine Equation (2021)    Anion gap 10 5 - 15    Comment: Performed at [facility], [address] [place], [place] [address]  CBC     Status: Abnormal   Collection Time: 09/08/22 11:55 PM  Result Value Ref Range   WBC 5.3 4.0 - 10.5 K/uL   RBC 4.91 3.87 - 5.11 MIL/uL   Hemoglobin 13.7 12.0 - 15.0 g/dL   HCT 08.6 57.8 - 46.9 %   MCV 88.4 80.0 - 100.0 fL   MCH 27.9 26.0 - 34.0 pg   MCHC 31.6 30.0 - 36.0 g/dL   RDW 62.9 (H) 52.8 - 41.3 %   Platelets 100 (L) 150 - 400 K/uL   nRBC 0.0 0.0 - 0.2 %    Comment: Performed at [facility], [address], [place], [place] [address]  Basic metabolic panel     Status: Abnormal   Collection Time: 09/08/22 11:55 PM  Result Value Ref Range   Sodium 135 135 - 145 mmol/L   Potassium 3.5 3.5 - 5.1 mmol/L   Chloride 101 98 - 111 mmol/L   CO2 20 (L) 22 - 32 mmol/L   Glucose, Bld 159 (H) 70 - 99 mg/dL    Comment: Glucose reference range applies only to samples taken after fasting for at least 8 hours.   BUN 28 (H) 8 - 23 mg/dL   Creatinine, Ser 0.27 (H) 0.44 - 1.00 mg/dL  Calcium 9.3 8.9 - 10.3 mg/dL   GFR, Estimated 32 (L) >60 mL/min    Comment: (NOTE) Calculated using the CKD-EPI Creatinine Equation (2021)    Anion gap 14 5 - 15    Comment: Performed at [facility], [address] [place], [place] [address]  Troponin I (High Sensitivity)     Status: Abnormal   Collection Time: 09/08/22 11:55 PM  Result Value Ref Range   Troponin I (High Sensitivity) 40 (H) <18 ng/L    Comment: (NOTE) Elevated high sensitivity troponin I (hsTnI) values and significant  changes across serial measurements may suggest ACS  but many other  chronic and acute conditions are known to elevate hsTnI results.  Refer to the "Links" section for chest pain algorithms and additional  guidance. Performed at [facility], [address] [place], [place] [address]   Brain natriuretic peptide     Status: Abnormal   Collection Time: 09/08/22 11:55 PM  Result Value Ref Range   B Natriuretic Peptide 2,333.9 (H) 0.0 - 100.0 pg/mL    Comment: Performed at [facility], [address] [place], [place] [address]  Basic metabolic panel     Status: Abnormal   Collection Time: 09/14/22 11:00 AM  Result Value Ref Range   Sodium 134 (L) 135 - 145 mmol/L   Potassium 4.1 3.5 - 5.1 mmol/L   Chloride 101 98 - 111 mmol/L   CO2 19 (L) 22 - 32 mmol/L   Glucose, Bld 122 (H) 70 - 99 mg/dL    Comment: Glucose reference range applies only to samples taken after fasting for at least 8 hours.   BUN 35 (H) 8 - 23 mg/dL   Creatinine, Ser 9.14 (H) 0.44 - 1.00 mg/dL   Calcium 9.6 8.9 - 78.2 mg/dL   GFR, Estimated 31 (L) >60 mL/min    Comment: (NOTE) Calculated using the CKD-EPI Creatinine Equation (2021)    Anion gap 14 5 - 15    Comment: Performed at [facility], [address], [name], [place] [address]  Brain natriuretic peptide     Status: Abnormal   Collection Time: 09/14/22 11:00 AM  Result Value Ref Range   B Natriuretic Peptide 1,173.4 (H) 0.0 - 100.0 pg/mL    Comment: Performed at [facility], [address] [place], [place] [address]  Magnesium     Status: None   Collection Time: 09/14/22 11:00 AM  Result Value Ref Range   Magnesium 2.1 1.7 - 2.4 mg/dL    Comment: Performed at [facility], [address], [place], [place] [address]  POCT CBG (Fasting - Glucose)     Status: Abnormal   Collection Time: 09/29/22 11:34 AM  Result Value Ref Range   Glucose Fasting, POC 112 (A) 70 - 99 mg/dL       Assessment & Plan:   Problem List Items Addressed This Visit       Cardiovascular  and Mediastinum   Primary pulmonary hypertension (HCC)   Relevant Orders   Ambulatory referral to Home Health   Chronic diastolic heart failure (HCC)   Relevant Orders   Ambulatory referral to Home Health     Respiratory   Chronic obstructive pulmonary disease, unspecified COPD type (HCC)     Endocrine   Type 2 diabetes mellitus with hyperglycemia (HCC) - Primary   Relevant Orders   POCT CBG (Fasting - Glucose) (Completed)  Ordering home health for patient to help with her  physical therapy and working to get an aide for her to help with ADLs Will also send info to [facility] so we can get respiratory therapy to help and sending oxygen.    Return as previously scheduled next month, for F/U.   Total time spent: 30 minutes  Miki Kins, FNP  09/29/2022

## 2022-09-29 NOTE — Patient Instructions (Addendum)
[facility] Guide, [facility] [phone]    George Ina RN,BSN,CCM [facility] Care Coordination [phone] direct line

## 2022-10-04 DIAGNOSIS — G4733 Obstructive sleep apnea (adult) (pediatric): Secondary | ICD-10-CM | POA: Diagnosis not present

## 2022-10-07 ENCOUNTER — Telehealth (HOSPITAL_COMMUNITY): Payer: Self-pay | Admitting: Pharmacist

## 2022-10-07 NOTE — Telephone Encounter (Signed)
Received message from [facility]:   Patient Jamie Murray [birth year] has been cleared for the [facility] referral. Pharmacy and nursing will be working with the patient for start of care.   Will add to medication list.   Karle Plumber, PharmD, BCPS, BCCP, CPP Heart Failure Clinic Pharmacist [phone]

## 2022-10-07 NOTE — Telephone Encounter (Signed)
Advanced Heart Failure Patient Advocate Encounter  Received notification from [facility] that they were waiting a return call from the patient before sending her to [facility] to fill Orenitram. Called and spoke with patient's daughter. She stated they spoke with [facility] yesterday and are awaiting a return call from [facility] for counseling on the medication.

## 2022-10-12 NOTE — Telephone Encounter (Signed)
Advanced Heart Failure Patient Advocate Encounter  Received notification from [facility] that first shipment was dispensed by [facility], [phone]) on 05/06.  Archer Asa, CPhT

## 2022-10-13 ENCOUNTER — Ambulatory Visit: Payer: Medicare PPO | Admitting: Family

## 2022-10-15 ENCOUNTER — Other Ambulatory Visit (HOSPITAL_COMMUNITY): Payer: Self-pay | Admitting: Cardiology

## 2022-10-15 DIAGNOSIS — I270 Primary pulmonary hypertension: Secondary | ICD-10-CM

## 2022-10-17 DIAGNOSIS — Z794 Long term (current) use of insulin: Secondary | ICD-10-CM | POA: Diagnosis not present

## 2022-10-17 DIAGNOSIS — E1142 Type 2 diabetes mellitus with diabetic polyneuropathy: Secondary | ICD-10-CM | POA: Diagnosis not present

## 2022-10-20 ENCOUNTER — Ambulatory Visit: Payer: Medicare PPO | Admitting: Family

## 2022-10-20 ENCOUNTER — Encounter: Payer: Self-pay | Admitting: Family

## 2022-10-20 VITALS — BP 118/70 | HR 67 | Ht 67.0 in | Wt 160.8 lb

## 2022-10-20 DIAGNOSIS — E1122 Type 2 diabetes mellitus with diabetic chronic kidney disease: Secondary | ICD-10-CM

## 2022-10-20 DIAGNOSIS — E782 Mixed hyperlipidemia: Secondary | ICD-10-CM

## 2022-10-20 DIAGNOSIS — J449 Chronic obstructive pulmonary disease, unspecified: Secondary | ICD-10-CM

## 2022-10-20 DIAGNOSIS — E1165 Type 2 diabetes mellitus with hyperglycemia: Secondary | ICD-10-CM | POA: Diagnosis not present

## 2022-10-20 DIAGNOSIS — E538 Deficiency of other specified B group vitamins: Secondary | ICD-10-CM | POA: Diagnosis not present

## 2022-10-20 DIAGNOSIS — N1832 Chronic kidney disease, stage 3b: Secondary | ICD-10-CM | POA: Diagnosis not present

## 2022-10-20 DIAGNOSIS — E559 Vitamin D deficiency, unspecified: Secondary | ICD-10-CM

## 2022-10-20 DIAGNOSIS — E039 Hypothyroidism, unspecified: Secondary | ICD-10-CM

## 2022-10-20 DIAGNOSIS — I10 Essential (primary) hypertension: Secondary | ICD-10-CM | POA: Diagnosis not present

## 2022-10-20 DIAGNOSIS — Z794 Long term (current) use of insulin: Secondary | ICD-10-CM

## 2022-10-20 LAB — GLUCOSE, POCT (MANUAL RESULT ENTRY): POC Glucose: 168 mg/dl — AB (ref 70–99)

## 2022-10-20 NOTE — Assessment & Plan Note (Signed)
Blood pressure well controlled with current medications.  Continue current therapy.  Will reassess at follow up.  

## 2022-10-20 NOTE — Patient Instructions (Signed)
[facility]:  [address], [place], [place] [address] [phone]

## 2022-10-20 NOTE — Progress Notes (Signed)
Established Patient Office Visit  Subjective:  Patient ID: Jamie Murray, female    DOB: 09-Jan-1946  Age: 77 y.o. MRN: [id number]  Chief Complaint  Patient presents with   Follow-up    3 month follow up    Patient is here today for her 3 months follow up.  She has been feeling well since last appointment.   She does not have additional concerns to discuss today.  Labs are due today. She needs refills.   I have reviewed her active problem list, medication list, allergies, notes from last encounter, lab results for her appointment today.    No other concerns at this time.   Past Medical History:  Diagnosis Date   Allergy    Anemia    Anxiety    Chest pain, rule out acute myocardial infarction 08/09/2016   COPD (chronic obstructive pulmonary disease) (HCC)    Depression    Diabetes (HCC)    Diarrhea, functional 01/25/2016   Edema of lower extremity 02/03/2020   Elevated troponin level 01/09/2020   GERD (gastroesophageal reflux disease)    Hyperlipidemia    Hypertension    Hypokalemia 12/22/2015   Hypomagnesemia 03/22/2016   Insomnia    Macrocytosis 01/25/2016   Migraines    Osteoporosis    Palpitations 11/25/2013   Formatting of this note might be different from the original. Note: Unchanged   [facility] spotted fever    Transient cerebral ischemia    Type 2 diabetes mellitus with hyperglycemia (HCC) 02/14/2012   Formatting of this note might be different from the original. Last Assessment & Plan: Hgb A1C is 7.1%.  Don't make any changes now.  Will hopefully increase exercise when pulmonary hypertension is properly treated.  ? Pulmonary rehab Note: Unchanged Formatting of this note might be different from the original. Formatting of this note might be different from the original.  Formatting of this note    Vertigo    every 2-3 months    Past Surgical History:  Procedure Laterality Date   ABDOMINAL HYSTERECTOMY     CORONARY ANGIOPLASTY      ESOPHAGOGASTRODUODENOSCOPY (EGD) WITH PROPOFOL N/A 08/24/2015   Procedure: ESOPHAGOGASTRODUODENOSCOPY (EGD) WITH PROPOFOL with dialation;  Surgeon: Midge Minium, MD;  Location: [facility];  Service: Endoscopy;  Laterality: N/A;  Diabetic - oral meds   RIGHT/LEFT HEART CATH AND CORONARY/GRAFT ANGIOGRAPHY N/A 01/13/2020   Procedure: RIGHT/LEFT HEART CATH AND CORONARY/GRAFT ANGIOGRAPHY;  Surgeon: Iran Ouch, MD;  Location: [facility];  Service: Cardiovascular;  Laterality: N/A;   TUBAL LIGATION      Social History   Socioeconomic History   Marital status: Widowed    Spouse name: Chanetta Marshall   Number of children: 3   Years of education: 14   Highest education level: Not on file  Occupational History    Comment: work part time, day care   Occupation: Retired  Tobacco Use   Smoking status: Never   Smokeless tobacco: Never  Vaping Use   Vaping Use: Never used  Substance and Sexual Activity   Alcohol use: No    Alcohol/week: 0.0 standard drinks of alcohol   Drug use: No   Sexual activity: Not Currently    Birth control/protection: None, Post-menopausal  Other Topics Concern   Not on file  Social History Narrative   Caffeine use- coffee 2 cups daily, Coke once a week, green tea daily   Social Determinants of Health   Financial Resource Strain: Not on file  Food Insecurity: No Food Insecurity (08/27/2022)   Hunger Vital Sign    Worried About Running Out of Food in the Last Year: Never true    Ran Out of Food in the Last Year: Never true  Transportation Needs: No Transportation Needs (08/27/2022)   PRAPARE - Administrator, Civil Service (Medical): No    Lack of Transportation (Non-Medical): No  Physical Activity: Not on file  Stress: Not on file  Social Connections: Not on file  Intimate Partner Violence: Not At Risk (08/27/2022)   Humiliation, Afraid, Rape, and Kick questionnaire    Fear of Current or Ex-Partner: No    Emotionally Abused: No     Physically Abused: No    Sexually Abused: No    Family History  Problem Relation Age of Onset   Diabetes Mother    Hypertension Mother    Stroke Mother    Heart disease Mother        MI   Cancer Father        pancreatic   Stroke Father    Hypertension Sister    Diabetes Brother    Diabetes Maternal Grandfather    Diabetes Sister    Diabetes Sister    Diabetes Brother    Diabetes Brother     Allergies  Allergen Reactions   Atorvastatin     Note: joint pain   Fluoxetine Other (See Comments)    "felt crazy"   Levemir [Insulin Detemir] Other (See Comments)    Bruising   Prozac [Fluoxetine Hcl] Other (See Comments)    "felt crazy"    Review of Systems  Respiratory:  Positive for shortness of breath (on exertion).   All other systems reviewed and are negative.      Objective:   BP 118/70   Pulse 67   Ht 5\' 7"  (1.702 m)   Wt 160 lb 12.8 oz (72.9 kg)   LMP  (LMP Unknown)   SpO2 100%   BMI 25.18 kg/m   Vitals:   10/20/22 1124  BP: 118/70  Pulse: 67  Height: 5\' 7"  (1.702 m)  Weight: 160 lb 12.8 oz (72.9 kg)  SpO2: 100%  BMI (Calculated): 25.18    Physical Exam Vitals and nursing note reviewed.  Constitutional:      Appearance: Normal appearance. She is normal weight.  HENT:     Head: Normocephalic.  Eyes:     Pupils: Pupils are equal, round, and reactive to light.  Cardiovascular:     Rate and Rhythm: Normal rate.  Pulmonary:     Effort: Pulmonary effort is normal.  Neurological:     General: No focal deficit present.     Mental Status: She is alert and oriented to person, place, and time. Mental status is at baseline.  Psychiatric:        Mood and Affect: Mood normal.        Behavior: Behavior normal.        Thought Content: Thought content normal.        Judgment: Judgment normal.      Results for orders placed or performed in visit on 10/20/22  POCT Glucose (CBG)  Result Value Ref Range   POC Glucose 168 (A) 70 - 99 mg/dl    Recent  Results (from the past 2160 hour(s))  B Nat Peptide     Status: Abnormal   Collection Time: 08/24/22 11:11 AM  Result Value Ref Range   B Natriuretic Peptide 2,366.2 (H) 0.0 -  100.0 pg/mL    Comment: Performed at [facility], [address], [place], [place] [address]  Basic Metabolic Panel (BMET)     Status: Abnormal   Collection Time: 08/24/22 11:11 AM  Result Value Ref Range   Sodium 143 135 - 145 mmol/L   Potassium 4.1 3.5 - 5.1 mmol/L   Chloride 112 (H) 98 - 111 mmol/L   CO2 22 22 - 32 mmol/L   Glucose, Bld 146 (H) 70 - 99 mg/dL    Comment: Glucose reference range applies only to samples taken after fasting for at least 8 hours.   BUN 33 (H) 8 - 23 mg/dL   Creatinine, Ser 6.04 (H) 0.44 - 1.00 mg/dL   Calcium 8.8 (L) 8.9 - 10.3 mg/dL   GFR, Estimated 28 (L) >60 mL/min    Comment: (NOTE) Calculated using the CKD-EPI Creatinine Equation (2021)    Anion gap 9 5 - 15    Comment: Performed at [facility], [address] [place], [place] [address]  CBC with Differential     Status: Abnormal   Collection Time: 08/26/22 11:05 AM  Result Value Ref Range   WBC 4.3 4.0 - 10.5 K/uL   RBC 4.26 3.87 - 5.11 MIL/uL   Hemoglobin 12.1 12.0 - 15.0 g/dL   HCT 11.9 14.7 - 82.9 %   MCV 94.1 80.0 - 100.0 fL   MCH 28.4 26.0 - 34.0 pg   MCHC 30.2 30.0 - 36.0 g/dL   RDW 56.2 (H) 13.0 - 86.5 %   Platelets 173 150 - 400 K/uL   nRBC 0.0 0.0 - 0.2 %   Neutrophils Relative % 54 %   Neutro Abs 2.3 1.7 - 7.7 K/uL   Lymphocytes Relative 38 %   Lymphs Abs 1.6 0.7 - 4.0 K/uL   Monocytes Relative 6 %   Monocytes Absolute 0.3 0.1 - 1.0 K/uL   Eosinophils Relative 1 %   Eosinophils Absolute 0.1 0.0 - 0.5 K/uL   Basophils Relative 1 %   Basophils Absolute 0.0 0.0 - 0.1 K/uL   Immature Granulocytes 0 %   Abs Immature Granulocytes 0.01 0.00 - 0.07 K/uL    Comment: Performed at [facility], [address] [place], [place] [address]  Comprehensive metabolic panel     Status:  Abnormal   Collection Time: 08/26/22 11:05 AM  Result Value Ref Range   Sodium 140 135 - 145 mmol/L   Potassium 3.8 3.5 - 5.1 mmol/L   Chloride 106 98 - 111 mmol/L   CO2 22 22 - 32 mmol/L   Glucose, Bld 115 (H) 70 - 99 mg/dL    Comment: Glucose reference range applies only to samples taken after fasting for at least 8 hours.   BUN 32 (H) 8 - 23 mg/dL   Creatinine, Ser 6.29 (H) 0.44 - 1.00 mg/dL   Calcium 9.0 8.9 - 52.8 mg/dL   Total Protein 6.8 6.5 - 8.1 g/dL   Albumin 3.7 3.5 - 5.0 g/dL   AST 27 15 - 41 U/L   ALT 21 0 - 44 U/L   Alkaline Phosphatase 70 38 - 126 U/L   Total Bilirubin 1.0 0.3 - 1.2 mg/dL   GFR, Estimated 29 (L) >60 mL/min    Comment: (NOTE) Calculated using the CKD-EPI Creatinine Equation (2021)    Anion gap 12 5 - 15    Comment: Performed at [facility], [address], [place], [place] [address]  Troponin I (High  Sensitivity)     Status: Abnormal   Collection Time: 08/26/22 11:05 AM  Result Value Ref Range   Troponin I (High Sensitivity) 26 (H) <18 ng/L    Comment: (NOTE) Elevated high sensitivity troponin I (hsTnI) values and significant  changes across serial measurements may suggest ACS but many other  chronic and acute conditions are known to elevate hsTnI results.  Refer to the "Links" section for chest pain algorithms and additional  guidance. Performed at [facility], [address] [place], [place] [address]   Troponin I (High Sensitivity)     Status: Abnormal   Collection Time: 08/26/22  1:00 PM  Result Value Ref Range   Troponin I (High Sensitivity) 25 (H) <18 ng/L    Comment: (NOTE) Elevated high sensitivity troponin I (hsTnI) values and significant  changes across serial measurements may suggest ACS but many other  chronic and acute conditions are known to elevate hsTnI results.  Refer to the "Links" section for chest pain algorithms and additional  guidance. Performed at [facility], [address]  [place], [place] [address]   CBG monitoring, ED     Status: Abnormal   Collection Time: 08/26/22  4:27 PM  Result Value Ref Range   Glucose-Capillary 139 (H) 70 - 99 mg/dL    Comment: Glucose reference range applies only to samples taken after fasting for at least 8 hours.  CBG monitoring, ED     Status: None   Collection Time: 08/26/22  9:23 PM  Result Value Ref Range   Glucose-Capillary 75 70 - 99 mg/dL    Comment: Glucose reference range applies only to samples taken after fasting for at least 8 hours.  Basic metabolic panel     Status: Abnormal   Collection Time: 08/27/22  4:09 AM  Result Value Ref Range   Sodium 137 135 - 145 mmol/L   Potassium 3.0 (L) 3.5 - 5.1 mmol/L   Chloride 106 98 - 111 mmol/L   CO2 22 22 - 32 mmol/L   Glucose, Bld 101 (H) 70 - 99 mg/dL    Comment: Glucose reference range applies only to samples taken after fasting for at least 8 hours.   BUN 38 (H) 8 - 23 mg/dL   Creatinine, Ser 0.98 (H) 0.44 - 1.00 mg/dL   Calcium 8.8 (L) 8.9 - 10.3 mg/dL   GFR, Estimated 28 (L) >60 mL/min    Comment: (NOTE) Calculated using the CKD-EPI Creatinine Equation (2021)    Anion gap 9 5 - 15    Comment: Performed at [facility], [address], [place], [place] [address]  Magnesium     Status: None   Collection Time: 08/27/22  4:09 AM  Result Value Ref Range   Magnesium 2.0 1.7 - 2.4 mg/dL    Comment: Performed at [facility], [address] [place], [place] [address]  Glucose, capillary     Status: None   Collection Time: 08/27/22  8:18 AM  Result Value Ref Range   Glucose-Capillary 76 70 - 99 mg/dL    Comment: Glucose reference range applies only to samples taken after fasting for at least 8 hours.  ECHOCARDIOGRAM COMPLETE     Status: None   Collection Time: 08/27/22 10:56 AM  Result Value Ref Range   Weight 2,734.4 oz   Height 67 in   BP 114/60 mmHg   Ao pk vel 1.50 m/s   AV Area VTI 1.48 cm2   AR max vel 1.59 cm2  AV Mean grad 4.5 mmHg    AV Peak grad 8.9 mmHg   Single Plane A2C EF 71.2 %   Single Plane A4C EF 84.5 %   Calc EF 78.4 %   S' Lateral 1.10 cm   AV Area mean vel 1.57 cm2   Area-P 1/2 2.93 cm2   Est EF 55 - 60%   Glucose, capillary     Status: Abnormal   Collection Time: 08/27/22 11:33 AM  Result Value Ref Range   Glucose-Capillary 152 (H) 70 - 99 mg/dL    Comment: Glucose reference range applies only to samples taken after fasting for at least 8 hours.  Glucose, capillary     Status: Abnormal   Collection Time: 08/27/22  4:41 PM  Result Value Ref Range   Glucose-Capillary 172 (H) 70 - 99 mg/dL    Comment: Glucose reference range applies only to samples taken after fasting for at least 8 hours.  Glucose, capillary     Status: Abnormal   Collection Time: 08/27/22  9:06 PM  Result Value Ref Range   Glucose-Capillary 156 (H) 70 - 99 mg/dL    Comment: Glucose reference range applies only to samples taken after fasting for at least 8 hours.  Basic metabolic panel     Status: Abnormal   Collection Time: 08/28/22  3:48 AM  Result Value Ref Range   Sodium 137 135 - 145 mmol/L   Potassium 3.6 3.5 - 5.1 mmol/L   Chloride 101 98 - 111 mmol/L   CO2 23 22 - 32 mmol/L   Glucose, Bld 116 (H) 70 - 99 mg/dL    Comment: Glucose reference range applies only to samples taken after fasting for at least 8 hours.   BUN 37 (H) 8 - 23 mg/dL   Creatinine, Ser 5.78 (H) 0.44 - 1.00 mg/dL   Calcium 9.1 8.9 - 46.9 mg/dL   GFR, Estimated 28 (L) >60 mL/min    Comment: (NOTE) Calculated using the CKD-EPI Creatinine Equation (2021)    Anion gap 13 5 - 15    Comment: Performed at [facility], [address] [place], [place] [address]  Magnesium     Status: None   Collection Time: 08/28/22  3:48 AM  Result Value Ref Range   Magnesium 1.9 1.7 - 2.4 mg/dL    Comment: Performed at [facility], [address] [place], [place] [address]  Glucose, capillary     Status: Abnormal   Collection Time: 08/28/22  8:40  AM  Result Value Ref Range   Glucose-Capillary 106 (H) 70 - 99 mg/dL    Comment: Glucose reference range applies only to samples taken after fasting for at least 8 hours.  Basic Metabolic Panel (BMET)     Status: Abnormal   Collection Time: 09/06/22 10:24 AM  Result Value Ref Range   Sodium 141 135 - 145 mmol/L   Potassium 3.5 3.5 - 5.1 mmol/L   Chloride 105 98 - 111 mmol/L   CO2 26 22 - 32 mmol/L   Glucose, Bld 180 (H) 70 - 99 mg/dL    Comment: Glucose reference range applies only to samples taken after fasting for at least 8 hours.   BUN 24 (H) 8 - 23 mg/dL   Creatinine, Ser 4.40 (H) 0.44 - 1.00 mg/dL   Calcium 9.2 8.9 - 10.2 mg/dL   GFR, Estimated 36 (L) >60 mL/min    Comment: (NOTE) Calculated using the CKD-EPI Creatinine Equation (2021)    Anion gap  10 5 - 15    Comment: Performed at [facility], [address] [place], [place] [address]  CBC     Status: Abnormal   Collection Time: 09/08/22 11:55 PM  Result Value Ref Range   WBC 5.3 4.0 - 10.5 K/uL   RBC 4.91 3.87 - 5.11 MIL/uL   Hemoglobin 13.7 12.0 - 15.0 g/dL   HCT 60.4 54.0 - 98.1 %   MCV 88.4 80.0 - 100.0 fL   MCH 27.9 26.0 - 34.0 pg   MCHC 31.6 30.0 - 36.0 g/dL   RDW 19.1 (H) 47.8 - 29.5 %   Platelets 100 (L) 150 - 400 K/uL   nRBC 0.0 0.0 - 0.2 %    Comment: Performed at [facility], [address], [place], [place] [address]  Basic metabolic panel     Status: Abnormal   Collection Time: 09/08/22 11:55 PM  Result Value Ref Range   Sodium 135 135 - 145 mmol/L   Potassium 3.5 3.5 - 5.1 mmol/L   Chloride 101 98 - 111 mmol/L   CO2 20 (L) 22 - 32 mmol/L   Glucose, Bld 159 (H) 70 - 99 mg/dL    Comment: Glucose reference range applies only to samples taken after fasting for at least 8 hours.   BUN 28 (H) 8 - 23 mg/dL   Creatinine, Ser 8.65 (H) 0.44 - 1.00 mg/dL   Calcium 9.3 8.9 - 78.4 mg/dL   GFR, Estimated 32 (L) >60 mL/min    Comment: (NOTE) Calculated using the CKD-EPI Creatinine  Equation (2021)    Anion gap 14 5 - 15    Comment: Performed at [facility], [address] [place], [place] [address]  Troponin I (High Sensitivity)     Status: Abnormal   Collection Time: 09/08/22 11:55 PM  Result Value Ref Range   Troponin I (High Sensitivity) 40 (H) <18 ng/L    Comment: (NOTE) Elevated high sensitivity troponin I (hsTnI) values and significant  changes across serial measurements may suggest ACS but many other  chronic and acute conditions are known to elevate hsTnI results.  Refer to the "Links" section for chest pain algorithms and additional  guidance. Performed at [facility], [address] [place], [place] [address]   Brain natriuretic peptide     Status: Abnormal   Collection Time: 09/08/22 11:55 PM  Result Value Ref Range   B Natriuretic Peptide 2,333.9 (H) 0.0 - 100.0 pg/mL    Comment: Performed at [facility], [address] [place], [place] [address]  Basic metabolic panel     Status: Abnormal   Collection Time: 09/14/22 11:00 AM  Result Value Ref Range   Sodium 134 (L) 135 - 145 mmol/L   Potassium 4.1 3.5 - 5.1 mmol/L   Chloride 101 98 - 111 mmol/L   CO2 19 (L) 22 - 32 mmol/L   Glucose, Bld 122 (H) 70 - 99 mg/dL    Comment: Glucose reference range applies only to samples taken after fasting for at least 8 hours.   BUN 35 (H) 8 - 23 mg/dL   Creatinine, Ser 1.02 (H) 0.44 - 1.00 mg/dL   Calcium 9.6 8.9 - 72.5 mg/dL   GFR, Estimated 31 (L) >60 mL/min    Comment: (NOTE) Calculated using the CKD-EPI Creatinine Equation (2021)    Anion gap 14 5 - 15    Comment: Performed at [facility], [address], [place], [place] [address]  Brain  natriuretic peptide     Status: Abnormal   Collection Time: 09/14/22 11:00 AM  Result Value Ref Range   B Natriuretic Peptide 1,173.4 (H) 0.0 - 100.0 pg/mL    Comment: Performed at [facility], [address], [place], [place] [address]  Magnesium     Status: None    Collection Time: 09/14/22 11:00 AM  Result Value Ref Range   Magnesium 2.1 1.7 - 2.4 mg/dL    Comment: Performed at [facility], [address], [place], [place] [address]  POCT CBG (Fasting - Glucose)     Status: Abnormal   Collection Time: 09/29/22 11:34 AM  Result Value Ref Range   Glucose Fasting, POC 112 (A) 70 - 99 mg/dL  POCT Glucose (CBG)     Status: Abnormal   Collection Time: 10/20/22 11:30 AM  Result Value Ref Range   POC Glucose 168 (A) 70 - 99 mg/dl       Assessment & Plan:   Problem List Items Addressed This Visit       Active Problems   Hypothyroidism   Relevant Orders   CBC With Differential   CMP14+EGFR   TSH   Hypertension    Blood pressure well controlled with current medications.  Continue current therapy.  Will reassess at follow up.       Type 2 diabetes mellitus with hyperglycemia (HCC)    Checking labs today. Will call pt. With results  Continue current diabetes POC, as patient has been well controlled on current regimen.  Will adjust meds if needed based on labs.        Relevant Medications   TRESIBA FLEXTOUCH 200 UNIT/ML FlexTouch Pen   Other Relevant Orders   POCT Glucose (CBG) (Completed)   CBC With Differential   CMP14+EGFR   Hemoglobin A1c   Hyperlipidemia    Checking labs today.  Continue current therapy for lipid control. Will modify as needed based on labwork results.       Relevant Orders   Lipid panel   CBC With Differential   CMP14+EGFR   Chronic obstructive pulmonary disease, unspecified COPD type (HCC)   Relevant Orders   CBC With Differential   CMP14+EGFR   Type II diabetes mellitus with renal manifestations (HCC) - Primary    Checking labs today. Will call pt. With results  Continue current diabetes POC, as patient has been well controlled on current regimen.  Will adjust meds if needed based on labs.       Relevant Medications   TRESIBA FLEXTOUCH 200 UNIT/ML FlexTouch Pen   Other Visit Diagnoses      Vitamin D deficiency, unspecified       Relevant Orders   VITAMIN D 25 Hydroxy (Vit-D Deficiency, Fractures)   CBC With Differential   CMP14+EGFR   B12 deficiency due to diet       Relevant Orders   CBC With Differential   CMP14+EGFR   Vitamin B12       Return in about 3 months (around 01/20/2023) for F/U.   Total time spent: 30 minutes  Miki Kins, FNP  10/20/2022   This document may have been prepared by [facility] Voice Recognition software and as such may include unintentional dictation errors.

## 2022-10-20 NOTE — Assessment & Plan Note (Signed)
Checking labs today. Will call pt. With results  Continue current diabetes POC, as patient has been well controlled on current regimen.  Will adjust meds if needed based on labs.  

## 2022-10-20 NOTE — Assessment & Plan Note (Signed)
Checking labs today.  Continue current therapy for lipid control. Will modify as needed based on labwork results.  

## 2022-10-21 LAB — CMP14+EGFR
ALT: 10 IU/L (ref 0–32)
AST: 19 IU/L (ref 0–40)
Albumin/Globulin Ratio: 1.6 (ref 1.2–2.2)
Albumin: 4.1 g/dL (ref 3.8–4.8)
Alkaline Phosphatase: 74 IU/L (ref 44–121)
BUN/Creatinine Ratio: 13 (ref 12–28)
BUN: 22 mg/dL (ref 8–27)
Bilirubin Total: 0.3 mg/dL (ref 0.0–1.2)
CO2: 21 mmol/L (ref 20–29)
Calcium: 9.4 mg/dL (ref 8.7–10.3)
Chloride: 104 mmol/L (ref 96–106)
Creatinine, Ser: 1.7 mg/dL — ABNORMAL HIGH (ref 0.57–1.00)
Globulin, Total: 2.6 g/dL (ref 1.5–4.5)
Glucose: 129 mg/dL — ABNORMAL HIGH (ref 70–99)
Potassium: 4.4 mmol/L (ref 3.5–5.2)
Sodium: 141 mmol/L (ref 134–144)
Total Protein: 6.7 g/dL (ref 6.0–8.5)
eGFR: 31 mL/min/{1.73_m2} — ABNORMAL LOW (ref >59)

## 2022-10-21 LAB — TSH: TSH: 3.81 u[IU]/mL (ref 0.450–4.500)

## 2022-10-21 LAB — LIPID PANEL
Chol/HDL Ratio: 3 ratio (ref 0.0–4.4)
Cholesterol, Total: 142 mg/dL (ref 100–199)
HDL: 48 mg/dL (ref >39)
LDL Chol Calc (NIH): 75 mg/dL (ref 0–99)
Triglycerides: 100 mg/dL (ref 0–149)
VLDL Cholesterol Cal: 19 mg/dL (ref 5–40)

## 2022-10-21 LAB — CBC WITH DIFFERENTIAL
Basophils Absolute: 0 10*3/uL (ref 0.0–0.2)
Basos: 0 %
EOS (ABSOLUTE): 0 10*3/uL (ref 0.0–0.4)
Eos: 1 %
Hematocrit: 36.2 % (ref 34.0–46.6)
Hemoglobin: 11.7 g/dL (ref 11.1–15.9)
Immature Grans (Abs): 0 10*3/uL (ref 0.0–0.1)
Immature Granulocytes: 1 %
Lymphocytes Absolute: 1.1 10*3/uL (ref 0.7–3.1)
Lymphs: 29 %
MCH: 27.6 pg (ref 26.6–33.0)
MCHC: 32.3 g/dL (ref 31.5–35.7)
MCV: 85 fL (ref 79–97)
Monocytes Absolute: 0.2 10*3/uL (ref 0.1–0.9)
Monocytes: 5 %
Neutrophils Absolute: 2.5 10*3/uL (ref 1.4–7.0)
Neutrophils: 64 %
RBC: 4.24 x10E6/uL (ref 3.77–5.28)
RDW: 16.6 % — ABNORMAL HIGH (ref 11.7–15.4)
WBC: 3.9 10*3/uL (ref 3.4–10.8)

## 2022-10-21 LAB — HEMOGLOBIN A1C
Est. average glucose Bld gHb Est-mCnc: 189 mg/dL
Hgb A1c MFr Bld: 8.2 % — ABNORMAL HIGH (ref 4.8–5.6)

## 2022-10-21 LAB — VITAMIN D 25 HYDROXY (VIT D DEFICIENCY, FRACTURES): Vit D, 25-Hydroxy: 49.7 ng/mL (ref 30.0–100.0)

## 2022-10-21 LAB — VITAMIN B12: Vitamin B-12: 593 pg/mL (ref 232–1245)

## 2022-11-03 DIAGNOSIS — G4733 Obstructive sleep apnea (adult) (pediatric): Secondary | ICD-10-CM | POA: Diagnosis not present

## 2022-11-10 ENCOUNTER — Other Ambulatory Visit: Payer: Self-pay | Admitting: Family

## 2022-11-11 MED ORDER — CLONAZEPAM 1 MG PO TABS: ORAL_TABLET | ORAL | 1 refills | Status: DC

## 2022-11-18 ENCOUNTER — Other Ambulatory Visit: Payer: Self-pay | Admitting: Internal Medicine

## 2022-11-28 ENCOUNTER — Ambulatory Visit: Payer: Medicare PPO

## 2022-11-28 DIAGNOSIS — I10 Essential (primary) hypertension: Secondary | ICD-10-CM

## 2022-11-28 DIAGNOSIS — R600 Localized edema: Secondary | ICD-10-CM

## 2022-11-28 DIAGNOSIS — E7801 Familial hypercholesterolemia: Secondary | ICD-10-CM

## 2022-11-28 NOTE — Chronic Care Management (AMB) (Signed)
Clinical Summary Next CCM Follow Up: - Next AWV: to be scheduled Summary for PCP:  - Recommended increasing Rosuvastatin to 20mg  (give high ASCVD risk, goal LDL <55) - Needs AWV scheduled   Patient's Chronic Conditions: Heart Failure, Hypertension (HTN), Allergic rhinitis, Chronic Obstructive Pulmonary Disease (COPD), Gastroesophageal Reflux Disease (GERD), Diabetes (DM), Hypothyroidism, Osteopenia or Osteoporosis, Chronic Kidney Disease (CKD), Anxiety, Depression, Anemia, Edema, Hyperlipidemia/Dyslipidemia (HLD) Doctor and Hospital Visits Were there PCP Visits since last visit with the Pharmacist?: Yes PCP Visits details: 07/15/22-Shirley-No medication changes 09/29/22-Shirley-No medication changes 10/20/22-Shirley-Increase insulin Degludec to 25un sq hs Were there Specialist Visits since last visit with the Pharmacist?: Yes Specialist Visits details: 07/18/22-Nephrology-No medication changes 08/15/22-[facility]-No medication changes 08/24/22-Cardiology-Increase Lasix to 40mg  BIDX4 then 40mg  QAM/20mg  QPM, Increase KCl to QD 09/09/22-ED VISIT-For HTN & CHF-No medication changes 09/14/22-[facility]-No medication changes 10/17/22-Podiatry-No medication changes Was there a Hospital Visit in last 30 days?: No Were there other Hospital Visits since last visit with the Pharmacist?: Yes Hospital Visits details: 08/26/22-08/28/22-Stopped Farxiga, changed Lasix to 40mg  BID Engagement Notes Laury Axon on 07/18/2022 05:36 PM R/S appointment to 11/27/25 at 2:00PM Joycelyn Das on 05/10/2022 11:08 AM cp f/u visit 08/08/22 at 1pm via phone Pre-Call Questions [facility]) Pre-Call Questions Date:: 11/24/2022 Time:: PM Outcome:: Unsuccessful - Left message Relevant details: LVM with appt details Engagement Notes Charlestine Massed on 11/24/2022 03:27 PM Pre call:2 mins Disease Assessments Subjective Information Visit Completed on: 11/28/2022 Subjective: She has Rheumatic disease,  diabetic. Lost husband 8 months ago and went in a downward spiral. Lifestyle habits: Drinks lots of water.  Takes 1.5hr nap in the afternoon. Gym once or twice a week, walks 1/2 hr and increasing to 1 hr. Diet: She was eating TV dinner and slowly changing to regular food. She doesn't cook since her husband died. Eats mixed vegetables. Eats nuts, popcorn. Juice like cranberry juice. What is the patient's sleep pattern?: Trouble falling asleep, Other (with free form text) Details: uses CPAP every night - does 7 hrs. Takes Clonazepam for sleep. SDOH: Accountable Health Communities Health-Related Social Needs Screening Tool ([employer]) SDOH questions were documented and reviewed (EMR or [employer]) within the past 12 months or since hospitalization?: No What is your living situation today? (ref #1): I have a steady place to live Think about the place you live. Do you have problems with any of the following? (ref #2): None of the above Within the past 12 months, you worried that your food would run out before you got money to buy more (ref #3): Never true Within the past 12 months, the food you bought just didn't last and you didn't have money to get more (ref #4): Never true In the past 12 months, has lack of reliable transportation kept you from medical appointments, meetings, work or from getting things needed for daily living? (ref #5): No In the past 12 months, has the electric, gas, oil, or water company threatened to shut off services in your home? (ref #6): No How often does anyone, including family and friends, physically hurt you? (ref #7): Never (1) How often does anyone, including family and friends, insult or talk down to you? (ref #8): Never (1) How often does anyone, including friends and family, threaten you with harm? (ref #9): Never (1) How often does anyone, including family and friends, scream or curse at you? (ref #10):  Never (1) Medication Adherence Does the [facility] have access to medication refill history?: No Is Patient using [facility] pharmacy?: No Name  and location of Current pharmacy: [facility]  Current Rx insurance plan: [employer] Are meds delivered by current pharmacy?: No - delivery not available Assessment:: Adherent Hypertension (HTN) Most Recent BP: 118/70 Most Recent HR: 67 taken on: 10/20/2022 Care Gap: Need BP documented or last BP 140/90 or higher: Needs to be addressed Assessed today?: Yes Goal: <130/80 mmHG Is Patient checking BP at home?: Yes Has patient experienced hypotension, dizziness, falls or bradycardia?: No We discussed: DASH diet:  following a diet emphasizing fruits and vegetables and low-fat dairy products along with whole grains, fish, poultry, and nuts. Reducing red meats and sugars., Proper Home BP Measurement, Contacting PCP office for signs and symptoms of high or low blood pressure (hypotension, dizziness, falls, headaches, edema), Increasing exercise (walking, biking, swimming) to a goal of 30 minutes per day, as able based on current activity level and health or as directed by your healthcare provider. Assessment:: Uncontrolled Drug: Amlodipine 10mg -QD Pharmacist Assessment: Appropriate, Effective, Safe, Accessible Drug: Hydralazine 50mg -Q8H Pharmacist Assessment: Appropriate, Effective, Safe, Accessible Drug: Metoprolol succ er 50mg -QD Pharmacist Assessment: Appropriate, Effective, Safe, Accessible Plan/Follow up: Order and Review: BP logs Hyperlipidemia/Dyslipidemia (HLD) Last Lipid panel on: 10/20/2022 TC (Goal<200): 142 LDL: 75 HDL (Goal>40): 48 TG (Goal<150): 100 ASCVD 10-year risk?is:: High (>20%) ASCVD Risk Score: 39.5 Assessed today?: Yes LDL Goal: <55 Assessment:: Uncontrolled Drug: Rosuvastatin 10mg -QD Pharmacist Assessment: Appropriate, Query Effectiveness Plan/Follow up: Modify : Rosuvastatin to 20mg  daily. Order and review: Lipid panel in 3  months Diabetes (DM) Most recent A1C: 10.2 taken on: 10/20/2022 Type: 2 Care Gap: Statin therapy needed: Addressed Care Gap: Need A1c documented or last A1c > 9 %: Needs to be addressed Care Gap: Need eye exam documented in EMR or by claim: Needs to be addressed Care Gap: Need eGFR and uACR for kidney health evaluation: Needs to be addressed Assessed today?: No Drug: Farxiga 10mg -QD Drug: Novolog-4 units for BS >200 Drug: Tresiba Flextouch-25units sub-q HS Chronic Obstructive Pulmonary Disease (COPD) Most recent Eosinophils: 0.0 taken on: 10/20/2022 Assessed today?: No Drug: Letairis 10mg -QD Heart Failure Most recent Echo with EF: 55-60% taken on: 08/27/2022 Most recent BNP: 1173.4 taken on: 09/14/2022 Assessed today?: No Drug: Lasix 40mg -BID Drug: Potassium CL ER 31meq-QD Anxiety Assessed today?: No Drug: Clonazepam 1mg -BID PRN anxiety Drug: Escitalopram 10mg -QD Depression Assessed today?: No Drug: Escitalopram 10mg -QD GERD Assessment Assessed today?: No Drug: Esomeprazole 40mg -QD Allergies Assessed today?: No Drug: Loratadine 10mg -Once daily Chronic kidney disease (CKD) Most Recent GFR: 31 taken on: 10/20/2022 Previous GFR: 36 taken on: 09/07/2022 Most recent microalbumin ratio: 30-300 tested on: 04/14/2022 Assessed today?: No Osteopenia or Osteoporosis Most recent Vitamin D 25-OH: 49.7 taken on: 10/20/2022 Assessed today?: No Hypothyroidism Most recent TSH: 3.810 taken on: 10/20/2022 Assessed today?: No Drug: Levothyroxine 50mcg-QD Preventative Health Care Gap: Colorectal cancer screening: Patient excluded from population (Age > 75, hx of colorectal cancer or colectomy, hospice services) Care Gap: Breast cancer screening: Patient excluded from population (Age > 20, hx of bilateral mastectomy, frailty, hospice services) Care Gap: Annual Wellness Visit (AWV): Needs to be addressed Engagement Notes Laury Axon on 11/24/2022 11:19 AM Chart prep-60 mins  (Billed for 20)  . Lynann Bologna, PharmD Office visit and Documentation 

## 2022-12-04 DIAGNOSIS — G4733 Obstructive sleep apnea (adult) (pediatric): Secondary | ICD-10-CM | POA: Diagnosis not present

## 2022-12-15 ENCOUNTER — Other Ambulatory Visit: Payer: Self-pay | Admitting: Family

## 2022-12-19 ENCOUNTER — Telehealth (HOSPITAL_COMMUNITY): Payer: Self-pay | Admitting: Pharmacist

## 2022-12-19 NOTE — Telephone Encounter (Signed)
Received message from [facility] that patient ran out of orenitram. Last dose was Saturday evening. She was taking 1.5 mg TID and was on the month 3 titration kit. Pharmacist called office asking for instructions on how to restart medication as patient had missed >2 doses. Patient has not had any side effects with the orenitram so far this titration.   Instructed [facility] to redispense the month 3 titration kit, which will restart her at 1.125 mg TID. Will increase by 0.125 mg TID every 7 days. Goal will be to be back on 1.5 mg TID at the end of the month, which will also be around the same time she has repeat appointment with Dr. Shirlee Latch. Current titration instructions target goal dose of 2.5 mg TID (and can increase after that as tolerated). If patient has difficulty tolerating this dose of orentitram, [facility] will call back and orenitram can be restarted at a lower dose to reduce the risk of prostacyclin side effects.   Karle Plumber, PharmD, BCPS, BCCP, CPP [facility] Pharmacist [phone]

## 2022-12-20 ENCOUNTER — Other Ambulatory Visit: Payer: Self-pay | Admitting: Family

## 2022-12-22 MED ORDER — HEMOCYTE PLUS 106-1 MG PO CAPS: 1.0000 | ORAL_CAPSULE | Freq: Every day | ORAL | 1 refills | Status: DC

## 2023-01-03 DIAGNOSIS — G4733 Obstructive sleep apnea (adult) (pediatric): Secondary | ICD-10-CM | POA: Diagnosis not present

## 2023-01-06 ENCOUNTER — Other Ambulatory Visit: Payer: Self-pay | Admitting: Family

## 2023-01-12 ENCOUNTER — Other Ambulatory Visit: Payer: Self-pay | Admitting: Family

## 2023-01-12 DIAGNOSIS — Z1231 Encounter for screening mammogram for malignant neoplasm of breast: Secondary | ICD-10-CM

## 2023-01-16 DIAGNOSIS — E1122 Type 2 diabetes mellitus with diabetic chronic kidney disease: Secondary | ICD-10-CM | POA: Diagnosis not present

## 2023-01-16 DIAGNOSIS — N1832 Chronic kidney disease, stage 3b: Secondary | ICD-10-CM | POA: Diagnosis not present

## 2023-01-16 DIAGNOSIS — I10 Essential (primary) hypertension: Secondary | ICD-10-CM | POA: Diagnosis not present

## 2023-01-16 DIAGNOSIS — D631 Anemia in chronic kidney disease: Secondary | ICD-10-CM | POA: Diagnosis not present

## 2023-01-18 ENCOUNTER — Ambulatory Visit (HOSPITAL_COMMUNITY)
Admission: RE | Admit: 2023-01-18 | Discharge: 2023-01-18 | Disposition: A | Discharge status: Home / Self Care | Payer: Medicare PPO | Source: Ambulatory Visit | Attending: Cardiology | Admitting: Cardiology

## 2023-01-18 ENCOUNTER — Encounter (HOSPITAL_COMMUNITY): Payer: Self-pay | Admitting: Cardiology

## 2023-01-18 VITALS — BP 102/50 | HR 51 | Wt 162.6 lb

## 2023-01-18 DIAGNOSIS — Z794 Long term (current) use of insulin: Secondary | ICD-10-CM | POA: Insufficient documentation

## 2023-01-18 DIAGNOSIS — Z7984 Long term (current) use of oral hypoglycemic drugs: Secondary | ICD-10-CM | POA: Diagnosis not present

## 2023-01-18 DIAGNOSIS — I5032 Chronic diastolic (congestive) heart failure: Secondary | ICD-10-CM | POA: Diagnosis not present

## 2023-01-18 DIAGNOSIS — K7460 Unspecified cirrhosis of liver: Secondary | ICD-10-CM | POA: Diagnosis not present

## 2023-01-18 DIAGNOSIS — I272 Pulmonary hypertension, unspecified: Secondary | ICD-10-CM | POA: Diagnosis not present

## 2023-01-18 DIAGNOSIS — N183 Chronic kidney disease, stage 3 unspecified: Secondary | ICD-10-CM | POA: Insufficient documentation

## 2023-01-18 DIAGNOSIS — E1122 Type 2 diabetes mellitus with diabetic chronic kidney disease: Secondary | ICD-10-CM | POA: Insufficient documentation

## 2023-01-18 DIAGNOSIS — R001 Bradycardia, unspecified: Secondary | ICD-10-CM | POA: Insufficient documentation

## 2023-01-18 DIAGNOSIS — Z79899 Other long term (current) drug therapy: Secondary | ICD-10-CM | POA: Diagnosis not present

## 2023-01-18 DIAGNOSIS — G4733 Obstructive sleep apnea (adult) (pediatric): Secondary | ICD-10-CM | POA: Diagnosis not present

## 2023-01-18 DIAGNOSIS — I2721 Secondary pulmonary arterial hypertension: Secondary | ICD-10-CM | POA: Diagnosis not present

## 2023-01-18 DIAGNOSIS — R700 Elevated erythrocyte sedimentation rate: Secondary | ICD-10-CM | POA: Diagnosis not present

## 2023-01-18 DIAGNOSIS — R7982 Elevated C-reactive protein (CRP): Secondary | ICD-10-CM | POA: Diagnosis not present

## 2023-01-18 DIAGNOSIS — I129 Hypertensive chronic kidney disease with stage 1 through stage 4 chronic kidney disease, or unspecified chronic kidney disease: Secondary | ICD-10-CM | POA: Diagnosis not present

## 2023-01-18 DIAGNOSIS — J984 Other disorders of lung: Secondary | ICD-10-CM | POA: Insufficient documentation

## 2023-01-18 LAB — BASIC METABOLIC PANEL
Anion gap: 7 (ref 5–15)
BUN: 15 mg/dL (ref 8–23)
CO2: 24 mmol/L (ref 22–32)
Calcium: 9.1 mg/dL (ref 8.9–10.3)
Chloride: 110 mmol/L (ref 98–111)
Creatinine, Ser: 1.49 mg/dL — ABNORMAL HIGH (ref 0.44–1.00)
GFR, Estimated: 36 mL/min — ABNORMAL LOW (ref >60)
Glucose, Bld: 105 mg/dL — ABNORMAL HIGH (ref 70–99)
Potassium: 3.8 mmol/L (ref 3.5–5.1)
Sodium: 141 mmol/L (ref 135–145)

## 2023-01-18 LAB — BRAIN NATRIURETIC PEPTIDE: B Natriuretic Peptide: 219.6 pg/mL — ABNORMAL HIGH (ref 0.0–100.0)

## 2023-01-18 LAB — MAGNESIUM: Magnesium: 2.3 mg/dL (ref 1.7–2.4)

## 2023-01-18 MED ORDER — HYDRALAZINE HCL 25 MG PO TABS: 25.0000 mg | ORAL_TABLET | Freq: Three times a day (TID) | ORAL | 3 refills | Status: DC

## 2023-01-18 MED ORDER — METOPROLOL SUCCINATE ER 25 MG PO TB24: 25.0000 mg | ORAL_TABLET | Freq: Every day | ORAL | 3 refills | Status: DC

## 2023-01-18 NOTE — Patient Instructions (Signed)
DECREASE Hydralazine to 25 mg Twice daily  DECREASE Toprol XL to 25mg  daily.  Labs done today, your results will be available in MyChart, we will contact you for abnormal readings.  Your physician has requested that you have an echocardiogram. Echocardiography is a painless test that uses sound waves to create images of your heart. It provides your doctor with information about the size and shape of your heart and how well your heart's chambers and valves are working. This procedure takes approximately one hour. There are no restrictions for this procedure. Please do NOT wear cologne, perfume, aftershave, or lotions (deodorant is allowed). Please arrive 15 minutes prior to your appointment time.  Your physician recommends that you schedule a follow-up appointment in: 3 months with an echocardiogram   If you have any questions or concerns before your next appointment please send [place] a message through [place] or call our office at [phone].    TO LEAVE A MESSAGE FOR THE NURSE SELECT OPTION 2, PLEASE LEAVE A MESSAGE INCLUDING: YOUR NAME DATE OF BIRTH CALL BACK NUMBER REASON FOR CALL**this is important as we prioritize the call backs  YOU WILL RECEIVE A CALL BACK THE SAME DAY AS LONG AS YOU CALL BEFORE 4:00 PM  At the [facility], you and your health needs are our priority. As part of our continuing mission to provide you with exceptional heart care, we have created designated Provider Care Teams. These Care Teams include your primary Cardiologist (physician) and Advanced Practice Providers (APPs- Physician Assistants and Nurse Practitioners) who all work together to provide you with the care you need, when you need it.   You may see any of the following providers on your designated Care Team at your next follow up: Dr Arvilla Meres Dr Marca Ancona Dr. Marcos Eke, NP Robbie Lis, [place] [facility] [place], [place] Brynda Peon, NP Karle Plumber, PharmD   Please be sure to bring in all your medications bottles to every appointment.    Thank you for choosing [facility]

## 2023-01-18 NOTE — Progress Notes (Signed)
6 Min Walk Test Completed  Pt ambulated 1124ft (335.8m) O2 Sat ranged 100-94% on room air HR ranged 80-85

## 2023-01-18 NOTE — Progress Notes (Signed)
PCP: Miki Kins, FNP HF Cardiology: Dr. Shirlee Latch  77 y.o. with history of HTN, type 2 diabetes, OSA on CPAP, and pulmonary hypertension/RV failure was referred by Dr. Kirke Corin for evaluation of pulmonary hypertension. It appears that PH was diagnosed as early as 2018 by echo.  Echo in 2020 showed PA systolic pressure 85 mmHg.  She says that she was told that the pulmonary hypertension was due to OSA.  She has been compliant with CPAP. She reports exertional dyspnea for about 4 years now.   In 8/21, she was admitted to [facility] with CHF.  Echo showed EF 55-60%, mild LVH, D-shaped septum, severe RV dilation with moderate RV systolic dysfunction, PASP 83, moderate TR, moderate PI, dilated IVC.  RHC/LHC showed no coronary disease, severe pulmonary hypertension with PVR 7.38 and preserved cardiac output.  High resolution CT chest in 9/21 showed no evidence for fibrotic ILD.    She saw rheumatology and was given no definite rheumatological diagnosis.    Echo in 7/22 showed EF 65-70% with moderate LVH, RV moderately dilated with moderately decreased RV systolic function, mild RVH, D-shaped septum, PASP 85 mmHg, moderate PR, normal IVC.  Echo in 9/23 showed EF 55-60%, D-shaped septum, severe RV dilation with severe RV dysfunction, mild TR, PASP 50 mmHg.   She is off Uptravi, unable to tolerate even the lowest dose due to diarrhea and headache.  She tried Tyvaso DPI; she developed headaches and did not like inhaling and found the device hard to manipulate so she stopped it.  She was not interested in trying the inhaler form of Tyvaso.    Admitted 3/24 CHF/RV failure, no improvement after increase in Lasix. Diuresed with IV lasix, eventually transitioned to po Lasix 40 bid and discharged home, weight 168.5 lbs. Seen in ED 09/09/22 with dyspnea. BNP >2k, but felt she was stable for discharge, with close HF follow up.   Today she returns for RV failure/pulmonary hypertension follow up. She has started orenitram and  is up to 1.5 tid. She seems to be doing much better. She was able to do a 6 minute walk today, has not been able to walk the last 2-3 appointments.  She went to the beach recently with her daughters and walked on the beach.  No dyspnea with ADLs.  She drives and is able to shop at [facility].  No lightheadedness, no chest pain. BP has been running lower. Weight stable.   ECG (personally reviewed): NSR, nonspecific diffuse ST changes  Labs (8/21): K 3.3, creatinine 1.39, ANA negative, anti-Sm negative, anti-SCL-70 negative, ANCA negative, RF elevated 52 but CCP negative, ESR 67, CRP 2.2 Labs (9/21): K 4.5, creatinine 1.8 => 1.45, anti-centromere Ab negative, BNP 309 Labs (11/21): K 4.1, creatinine 1.47, BNP 590 Labs (1/22): LDL 55, K 3.8, creatinine 1.02 Labs (5/22): LDL 74, K 4, creatinine 1.5 Labs (9/22): K 3.8, creatinine 1.43 Labs (12/22): LDL 54, K 3.8, creatinine 1.3 Labs (2/23): BNP 197, K 3.4, creatinine 1.3 Labs (6/23): BNP 862 Labs (9/23): K 4, creatinine 1.87 Labs (2/24): K 3.9, creatinine 1.39, LFTs normal Labs (4/24): K 3.5, creatinine 1.65 Labs (5/24): LDL 75 Labs (8/24): K 3.8, creatinine 1.27, hgb 9.3  6 minute walk (8/21): 154 m 6 minute walk (11/21): 183 m 6 minute walk (3/22): 351 m 6 minute walk (11/22): 381 m 6 minute walk (2/23): 290 m (off Uptravi) 6 minute walk (8/24): 335 m  PMH: 1. HTN 2. Type 2 diabetes 3. OSA: Uses CPAP.  4.  GERD 5. Hypothyroidism 6. Pulmonary hypertension/RV failure: Pulmonary hypertension diagnosed 2018 by echo.  - PFTs (6/18): moderate restriction - Echo 2020 with PASP 85 mmHg.  - Echo (8/21): EF 55-60%, mild LVH, D-shaped septum, severe RV dilation with moderate RV systolic dysfunction, PASP 83, moderate TR, moderate PI, dilated IVC.  - LHC/RHC (8/21): Normal coronaries; mean RA 3, PA 82/21 mean 44, mean PCWP 14, CI 2.2, PVR 7.38 WU.  - V/Q scan (8/21): No evidence for chronic PE.  - Serologic workup: ANA negative, anti-Sm  negative, anti-SCL-70 negative, ANCA negative, RF elevated 52 but CCP negative, ESR 67, CRP 2.2, anti-centromere negative - High resolution CT chest: No fibrotic ILD.  - Echo (7/22): EF 65-70% with moderate LVH, RV moderately dilated with moderately decreased RV systolic function, mild RVH, D-shaped septum, PASP 85 mmHg, moderate PR, normal IVC.  - Echo (9/23): EF 55-60%, D-shaped septum, severe RV dilation with severe RV dysfunction, mild TR, PASP 50 mmHg. 7. CKD stage 3.  8. Cirrhosis: Noted on 9/23 CT abdomen/pelvis. Suspect cardiogenic.   Social History   Socioeconomic History   Marital status: Widowed    Spouse name: Chanetta Marshall   Number of children: 3   Years of education: 14   Highest education level: Not on file  Occupational History    Comment: work part time, day care   Occupation: Retired  Tobacco Use   Smoking status: Never   Smokeless tobacco: Never  Vaping Use   Vaping status: Never Used  Substance and Sexual Activity   Alcohol use: No    Alcohol/week: 0.0 standard drinks of alcohol   Drug use: No   Sexual activity: Not Currently    Birth control/protection: None, Post-menopausal  Other Topics Concern   Not on file  Social History Narrative   Caffeine use- coffee 2 cups daily, Coke once a week, green tea daily   Social Determinants of Health   Financial Resource Strain: Not on file  Food Insecurity: No Food Insecurity (08/27/2022)   Hunger Vital Sign    Worried About Running Out of Food in the Last Year: Never true    Ran Out of Food in the Last Year: Never true  Transportation Needs: No Transportation Needs (08/27/2022)   PRAPARE - Administrator, Civil Service (Medical): No    Lack of Transportation (Non-Medical): No  Physical Activity: Not on file  Stress: Not on file  Social Connections: Not on file  Intimate Partner Violence: Not At Risk (08/27/2022)   Humiliation, Afraid, Rape, and Kick questionnaire    Fear of Current or Ex-Partner: No     Emotionally Abused: No    Physically Abused: No    Sexually Abused: No   Family History  Problem Relation Age of Onset   Diabetes Mother    Hypertension Mother    Stroke Mother    Heart disease Mother        MI   Cancer Father        pancreatic   Stroke Father    Hypertension Sister    Diabetes Brother    Diabetes Maternal Grandfather    Diabetes Sister    Diabetes Sister    Diabetes Brother    Diabetes Brother    ROS: All systems reviewed and negative except as per HPI.   Current Outpatient Medications  Medication Sig Dispense Refill   ACCU-CHEK GUIDE test strip 3 each by Other route 3 (three) times daily as needed for other (low blood sugar symptoms).  100 each 6   ambrisentan (LETAIRIS) 10 MG tablet TAKE 1 TABLET BY MOUTH EVERY DAY 30 tablet 3   amLODipine (NORVASC) 10 MG tablet Take 1 tablet (10 mg total) by mouth daily. 90 tablet 3   aspirin 81 MG tablet Take 81 mg by mouth daily.     Cholecalciferol (VITAMIN D3) 125 MCG (5000 UT) CAPS Take 1 capsule by mouth daily.     clonazePAM (KLONOPIN) 1 MG tablet TAKE 1 TABLET BY MOUTH UP TO TWICE DAILY AS NEEDED FOR ANXIETY 60 tablet 2   dapagliflozin propanediol (FARXIGA) 10 MG TABS tablet Take 1 tablet (10 mg total) by mouth daily before breakfast. 90 tablet 3   escitalopram (LEXAPRO) 10 MG tablet TAKE 1 TABLET BY MOUTH EVERY MORNING 90 tablet 1   esomeprazole (NEXIUM) 40 MG capsule TAKE 1 CAPSULE BY MOUTH EVERY DAY 90 capsule 1   Fe Fum-FA-B Cmp-C-Zn-Mg-Mn-Cu (HEMOCYTE PLUS) 106-1 MG CAPS Take 1 capsule by mouth daily. 90 capsule 1   furosemide (LASIX) 20 MG tablet Take 2 tablets (40 mg total) by mouth 2 (two) times daily. 90 tablet 0   insulin aspart (NOVOLOG) 100 UNIT/ML injection Inject 4 Units into the skin. Bs greater than 200     Insulin Degludec (TRESIBA [place]) Inject 16 Units into the skin at bedtime.     Insulin Pen Needle (B-D UF III MINI PEN NEEDLES) 31G X 5 MM MISC USE FOR INJECTIONS TWICE DAILY 100 each 5    levothyroxine (SYNTHROID) 75 MCG tablet TAKE 1 TABLET BY MOUTH EVERY MORNING ON AN EMPTY STOMACH 90 tablet 1   loratadine (CLARITIN) 10 MG tablet Take 10 mg by mouth daily.      meclizine (ANTIVERT) 25 MG tablet Take 25 mg by mouth 3 (three) times daily as needed for dizziness.      Multiple Vitamins-Minerals (MULTI ADULT GUMMIES) CHEW Chew by mouth daily.     potassium chloride (MICRO-K) 10 MEQ CR capsule Take 10 mEq by mouth daily.     rosuvastatin (CRESTOR) 10 MG tablet Take 10 mg by mouth daily.     tadalafil, PAH, (ADCIRCA) 20 MG tablet Take 2 tablets (40 mg total) by mouth daily. 60 tablet 11   Treprostinil Diolamine ER (ORENITRAM) 0.125 MG TBCR Take 1 tablet by mouth 3 (three) times daily.     hydrALAZINE (APRESOLINE) 25 MG tablet Take 1 tablet (25 mg total) by mouth every 8 (eight) hours. 200 tablet 3   metoprolol succinate (TOPROL-XL) 25 MG 24 hr tablet Take 1 tablet (25 mg total) by mouth daily. 90 tablet 3   No current facility-administered medications for this encounter.   Wt Readings from Last 3 Encounters:  01/18/23 73.8 kg (162 lb 9.6 oz)  10/20/22 72.9 kg (160 lb 12.8 oz)  09/29/22 73.7 kg (162 lb 6.4 oz)   BP (!) 102/50   Pulse (!) 51   Wt 73.8 kg (162 lb 9.6 oz)   LMP  (LMP Unknown)   SpO2 96%   BMI 25.47 kg/m  General: NAD Neck: No JVD, no thyromegaly or thyroid nodule.  Lungs: Clear to auscultation bilaterally with normal respiratory effort. CV: Nondisplaced PMI.  Heart regular S1/S2, no S3/S4, no murmur.  No peripheral edema.  No carotid bruit.  Normal pedal pulses.  Abdomen: Soft, nontender, no hepatosplenomegaly, no distention.  Skin: Intact without lesions or rashes.  Neurologic: Alert and oriented x 3.  Psych: Normal affect. Extremities: No clubbing or cyanosis.  HEENT: Normal.   Assessment/Plan: 1.  HTN: BP has been on the low side though she denies lightheadedness.  - Continue amlodipine.  - Decrease hydralazine to 25 mg bid.  2. OSA: Continue CPAP.     3. Pulmonary HTN/RV failure: Echo in 8/21 with EF 55-60%, mild LVH, D-shaped septum, severe RV dilation with moderate RV systolic dysfunction, PASP 83, moderate TR, moderate PI, dilated IVC.  Suspect long-standing RV dysfunction based on appearance of echo. RHC in 8/21 with severe pulmonary arterial hypertension, preserved cardiac output. PFTs from 6/18 showed moderate restriction.  V/Q scan from 8/21 showed no evidence for chronic PE. She has treated OSA.  Serologic workup was negative except for elevated CRP and ESR and elevated RF (normal CCP).  High resolution CT chest did not show evidence for ILD. I suspect long-standing group 1 PAH. Repeat echo in 7/22 showed EF 65-70% with moderate LVH, RV moderately dilated with moderately decreased RV systolic function, mild RVH, D-shaped septum, PASP 85 mmHg, moderate PR, normal IVC. Though echo still showed significant RV dysfunction, she symptomatically has felt better on PH meds.  She had to stop Uptravi due to intractable diarrhea and headache.  She was unable to use Tyvaso.  Echo in 9/23 showed EF 55-60%, D-shaped septum, severe RV dilation with severe RV dysfunction, mild TR, PASP 50 mmHg.  She has started on orenitram and is feeling much better overall, minimal side effects.  6 minute walk improved today. NYHA class II.  - Continue Lasix 40 mg bid. BMET and BNP today. - Continue Farxiga 10 mg daily.  - Continue ambrisentan 10.  - Continue tadalafil 40 mg daily.   - Continue uptitration of orenitram, goal 2.5 tid for now.  - Consider sotatercept as next step.  - Continue CPAP (OSA likely contributes but certainly cannot explain the extent of her PH).  - Echo at followup in 3 months.  4. Pulmonary insufficiency: Moderate on 9/23 echo.   5. Cirrhosis: Noted on imaging. Likely due  to RV failure and passive congestion, possible NAFLD.  6. Bradycardia: Decrease Toprol XL to 25 mg daily.   Followup in 3 months with echo  Marca Ancona  01/18/2023

## 2023-01-20 ENCOUNTER — Ambulatory Visit: Payer: Medicare PPO | Admitting: Family

## 2023-01-20 VITALS — BP 130/70 | Resp 66 | Ht 67.0 in | Wt 161.4 lb

## 2023-01-20 DIAGNOSIS — E538 Deficiency of other specified B group vitamins: Secondary | ICD-10-CM

## 2023-01-20 DIAGNOSIS — I10 Essential (primary) hypertension: Secondary | ICD-10-CM

## 2023-01-20 DIAGNOSIS — E039 Hypothyroidism, unspecified: Secondary | ICD-10-CM | POA: Diagnosis not present

## 2023-01-20 DIAGNOSIS — E782 Mixed hyperlipidemia: Secondary | ICD-10-CM | POA: Diagnosis not present

## 2023-01-20 DIAGNOSIS — J449 Chronic obstructive pulmonary disease, unspecified: Secondary | ICD-10-CM

## 2023-01-20 DIAGNOSIS — E1165 Type 2 diabetes mellitus with hyperglycemia: Secondary | ICD-10-CM | POA: Diagnosis not present

## 2023-01-20 DIAGNOSIS — E559 Vitamin D deficiency, unspecified: Secondary | ICD-10-CM | POA: Diagnosis not present

## 2023-01-20 DIAGNOSIS — I5032 Chronic diastolic (congestive) heart failure: Secondary | ICD-10-CM

## 2023-01-20 LAB — POCT CBG (FASTING - GLUCOSE)-MANUAL ENTRY: Glucose Fasting, POC: 74 mg/dL (ref 70–99)

## 2023-01-20 MED ORDER — DIPHENOXYLATE-ATROPINE 2.5-0.025 MG PO TABS: 1.0000 | ORAL_TABLET | Freq: Four times a day (QID) | ORAL | 2 refills | Status: DC | PRN

## 2023-01-20 NOTE — Progress Notes (Signed)
Established Patient Office Visit  Subjective:  Patient ID: Jamie Murray, female    DOB: 15-Oct-1945  Age: 77 y.o. MRN: [id number]  Chief Complaint  Patient presents with   Follow-up    Patient is here today for her 3 months follow up.  She has been feeling fairly well since last appointment.   She does have additional concerns to discuss today.  Diarrhea from treprostinil.  Needs something stronger than loperimide, as it is not helping.  Due for labs, has appt with Dr. Gershon Crane next month so we will hold A1C.  She needs refills.   I have reviewed her active problem list, medication list, allergies, notes from last encounter, lab results for her appointment today.     No other concerns at this time.   Past Medical History:  Diagnosis Date   Abnormal electrocardiogram 11/25/2013   Formatting of this note might be different from the original. Last Assessment & Plan: Resolved when went to the ER.  Will f/u with Dr. Welton Flakes Note: Unchanged Formatting of this note might be different from the original. Note: Unchanged Formatting of this note might be different from the original.  Formatting of this note might be different from the original. Last Assessment & Plan: Resolved when we   Allergy    Anemia    Anxiety    Bilateral hand pain 07/25/2018   Cerebral infarction, unspecified (HCC) 01/26/2015   Chest pain 08/09/2016   Chest pain, rule out acute myocardial infarction 08/09/2016   COPD (chronic obstructive pulmonary disease) (HCC)    Depression    Diabetes (HCC)    Diarrhea, functional 01/25/2016   Edema of lower extremity 02/03/2020   Elevated troponin level 01/09/2020   GERD (gastroesophageal reflux disease)    History of stroke 08/26/2022   Formatting of this note might be different from the original. Last Assessment & Plan:  Formatting of this note might be different from the original.  Continue ASA, statin     Hyperlipidemia    Hypertension    Hypokalemia  12/22/2015   Hypomagnesemia 03/22/2016   Insomnia    Macrocytosis 01/25/2016   Migraines    Osteoporosis    Palpitations 11/25/2013   Formatting of this note might be different from the original. Note: Unchanged   [facility] spotted fever    Transient cerebral ischemia    Type 2 diabetes mellitus with hyperglycemia (HCC) 02/14/2012   Formatting of this note might be different from the original. Last Assessment & Plan: Hgb A1C is 7.1%.  Don't make any changes now.  Will hopefully increase exercise when pulmonary hypertension is properly treated.  ? Pulmonary rehab Note: Unchanged Formatting of this note might be different from the original. Formatting of this note might be different from the original.  Formatting of this note    Vertigo    every 2-3 months    Past Surgical History:  Procedure Laterality Date   ABDOMINAL HYSTERECTOMY     CORONARY ANGIOPLASTY     ESOPHAGOGASTRODUODENOSCOPY (EGD) WITH PROPOFOL N/A 08/24/2015   Procedure: ESOPHAGOGASTRODUODENOSCOPY (EGD) WITH PROPOFOL with dialation;  Surgeon: Midge Minium, MD;  Location: [facility];  Service: Endoscopy;  Laterality: N/A;  Diabetic - oral meds   RIGHT/LEFT HEART CATH AND CORONARY/GRAFT ANGIOGRAPHY N/A 01/13/2020   Procedure: RIGHT/LEFT HEART CATH AND CORONARY/GRAFT ANGIOGRAPHY;  Surgeon: Iran Ouch, MD;  Location: [facility];  Service: Cardiovascular;  Laterality: N/A;   TUBAL LIGATION      Social History  Socioeconomic History   Marital status: Widowed    Spouse name: Chanetta Marshall   Number of children: 3   Years of education: 14   Highest education level: Not on file  Occupational History    Comment: work part time, day care   Occupation: Retired  Tobacco Use   Smoking status: Never   Smokeless tobacco: Never  Vaping Use   Vaping status: Never Used  Substance and Sexual Activity   Alcohol use: No    Alcohol/week: 0.0 standard drinks of alcohol   Drug use: No   Sexual activity: Not  Currently    Birth control/protection: None, Post-menopausal  Other Topics Concern   Not on file  Social History Narrative   Caffeine use- coffee 2 cups daily, Coke once a week, green tea daily   Social Determinants of Health   Financial Resource Strain: Not on file  Food Insecurity: No Food Insecurity (08/27/2022)   Hunger Vital Sign    Worried About Running Out of Food in the Last Year: Never true    Ran Out of Food in the Last Year: Never true  Transportation Needs: No Transportation Needs (08/27/2022)   PRAPARE - Administrator, Civil Service (Medical): No    Lack of Transportation (Non-Medical): No  Physical Activity: Not on file  Stress: Not on file  Social Connections: Not on file  Intimate Partner Violence: Not At Risk (08/27/2022)   Humiliation, Afraid, Rape, and Kick questionnaire    Fear of Current or Ex-Partner: No    Emotionally Abused: No    Physically Abused: No    Sexually Abused: No    Family History  Problem Relation Age of Onset   Diabetes Mother    Hypertension Mother    Stroke Mother    Heart disease Mother        MI   Cancer Father        pancreatic   Stroke Father    Hypertension Sister    Diabetes Brother    Diabetes Maternal Grandfather    Diabetes Sister    Diabetes Sister    Diabetes Brother    Diabetes Brother     Allergies  Allergen Reactions   Atorvastatin     Note: joint pain   Fluoxetine Other (See Comments)    "felt crazy"   Levemir [Insulin Detemir] Other (See Comments)    Bruising   Prozac [Fluoxetine Hcl] Other (See Comments)    "felt crazy"    Review of Systems  Constitutional:  Positive for malaise/fatigue.  Gastrointestinal:  Positive for diarrhea.  All other systems reviewed and are negative.      Objective:   BP 130/70   Resp (!) 66   Ht 5\' 7"  (1.702 m)   Wt 161 lb 6.4 oz (73.2 kg)   LMP  (LMP Unknown)   SpO2 92%   BMI 25.28 kg/m   Vitals:   01/20/23 1043  BP: 130/70  Resp: (!) 66   Height: 5\' 7"  (1.702 m)  Weight: 161 lb 6.4 oz (73.2 kg)  SpO2: 92%  BMI (Calculated): 25.27    Physical Exam Vitals and nursing note reviewed.  Constitutional:      Appearance: Normal appearance. She is normal weight.  HENT:     Head: Normocephalic.  Eyes:     Pupils: Pupils are equal, round, and reactive to light.  Cardiovascular:     Rate and Rhythm: Normal rate.  Pulmonary:     Effort: Pulmonary effort is  normal.  Neurological:     General: No focal deficit present.     Mental Status: She is alert and oriented to person, place, and time. Mental status is at baseline.  Psychiatric:        Mood and Affect: Mood normal.        Behavior: Behavior normal.        Thought Content: Thought content normal.        Judgment: Judgment normal.      Results for orders placed or performed in visit on 01/20/23  Lipid panel  Result Value Ref Range   Cholesterol, Total 118 100 - 199 mg/dL   Triglycerides 235 0 - 149 mg/dL   HDL 48 >57 mg/dL   VLDL Cholesterol Cal 20 5 - 40 mg/dL   LDL Chol Calc (NIH) 50 0 - 99 mg/dL   Chol/HDL Ratio 2.5 0.0 - 4.4 ratio  VITAMIN D 25 Hydroxy (Vit-D Deficiency, Fractures)  Result Value Ref Range   Vit D, 25-Hydroxy 54.2 30.0 - 100.0 ng/mL  CMP14+EGFR  Result Value Ref Range   Glucose 75 70 - 99 mg/dL   BUN 18 8 - 27 mg/dL   Creatinine, Ser 3.22 (H) 0.57 - 1.00 mg/dL   eGFR 33 (L) >02 RK/YHC/6.23   BUN/Creatinine Ratio 11 (L) 12 - 28   Sodium 142 134 - 144 mmol/L   Potassium 4.0 3.5 - 5.2 mmol/L   Chloride 105 96 - 106 mmol/L   CO2 22 20 - 29 mmol/L   Calcium 9.8 8.7 - 10.3 mg/dL   Total Protein 6.5 6.0 - 8.5 g/dL   Albumin 4.2 3.8 - 4.8 g/dL   Globulin, Total 2.3 1.5 - 4.5 g/dL   Bilirubin Total 0.3 0.0 - 1.2 mg/dL   Alkaline Phosphatase 71 44 - 121 IU/L   AST 14 0 - 40 IU/L   ALT 10 0 - 32 IU/L  TSH  Result Value Ref Range   TSH 3.250 0.450 - 4.500 uIU/mL  Vitamin B12  Result Value Ref Range   Vitamin B-12 376 232 - 1,245 pg/mL   POCT CBG (Fasting - Glucose)  Result Value Ref Range   Glucose Fasting, POC 74 70 - 99 mg/dL    Recent Results (from the past 2160 hour(s))  Basic Metabolic Panel (BMET)     Status: Abnormal   Collection Time: 01/18/23 11:56 AM  Result Value Ref Range   Sodium 141 135 - 145 mmol/L   Potassium 3.8 3.5 - 5.1 mmol/L   Chloride 110 98 - 111 mmol/L   CO2 24 22 - 32 mmol/L   Glucose, Bld 105 (H) 70 - 99 mg/dL    Comment: Glucose reference range applies only to samples taken after fasting for at least 8 hours.   BUN 15 8 - 23 mg/dL   Creatinine, Ser 7.62 (H) 0.44 - 1.00 mg/dL   Calcium 9.1 8.9 - 83.1 mg/dL   GFR, Estimated 36 (L) >60 mL/min    Comment: (NOTE) Calculated using the CKD-EPI Creatinine Equation (2021)    Anion gap 7 5 - 15    Comment: Performed at [facility], [address], [place], [place] [address]  B Nat Peptide     Status: Abnormal   Collection Time: 01/18/23 11:56 AM  Result Value Ref Range   B Natriuretic Peptide 219.6 (H) 0.0 - 100.0 pg/mL    Comment: Performed at [facility], [address] [place], [place] [address]  Magnesium  Status: None   Collection Time: 01/18/23 11:56 AM  Result Value Ref Range   Magnesium 2.3 1.7 - 2.4 mg/dL    Comment: Performed at [facility], [address], [place], [place] [address]  POCT CBG (Fasting - Glucose)     Status: None   Collection Time: 01/20/23 11:03 AM  Result Value Ref Range   Glucose Fasting, POC 74 70 - 99 mg/dL  Lipid panel     Status: None   Collection Time: 01/20/23 11:36 AM  Result Value Ref Range   Cholesterol, Total 118 100 - 199 mg/dL   Triglycerides 952 0 - 149 mg/dL   HDL 48 >84 mg/dL   VLDL Cholesterol Cal 20 5 - 40 mg/dL   LDL Chol Calc (NIH) 50 0 - 99 mg/dL   Chol/HDL Ratio 2.5 0.0 - 4.4 ratio    Comment:                                   T. Chol/HDL Ratio                                             Men  Women                               1/2 Avg.Risk  3.4    3.3                                    Avg.Risk  5.0    4.4                                2X Avg.Risk  9.6    7.1                                3X Avg.Risk 23.4   11.0   VITAMIN D 25 Hydroxy (Vit-D Deficiency, Fractures)     Status: None   Collection Time: 01/20/23 11:36 AM  Result Value Ref Range   Vit D, 25-Hydroxy 54.2 30.0 - 100.0 ng/mL    Comment: Vitamin D deficiency has been defined by the Institute of Medicine and an Endocrine Society practice guideline as a level of serum 25-OH vitamin D less than 20 ng/mL (1,2). The Endocrine Society went on to further define vitamin D insufficiency as a level between 21 and 29 ng/mL (2). 1. IOM (Institute of Medicine). 2010. Dietary reference    intakes for calcium and D. [place]: The    [employer]. 2. Holick MF, Binkley Dana, Bischoff-Ferrari HA, et al.    Evaluation, treatment, and prevention of vitamin D    deficiency: an Endocrine Society clinical practice    guideline. JCEM. 2011 Jul; 96(7):1911-30.   CMP14+EGFR     Status: Abnormal   Collection Time: 01/20/23 11:36 AM  Result Value Ref Range   Glucose 75 70 - 99 mg/dL   BUN 18 8 - 27 mg/dL   Creatinine, Ser 1.32 (H) 0.57 - 1.00 mg/dL   eGFR 33 (L) >44 WN/UUV/2.53   BUN/Creatinine  Ratio 11 (L) 12 - 28   Sodium 142 134 - 144 mmol/L   Potassium 4.0 3.5 - 5.2 mmol/L   Chloride 105 96 - 106 mmol/L   CO2 22 20 - 29 mmol/L   Calcium 9.8 8.7 - 10.3 mg/dL   Total Protein 6.5 6.0 - 8.5 g/dL   Albumin 4.2 3.8 - 4.8 g/dL   Globulin, Total 2.3 1.5 - 4.5 g/dL   Bilirubin Total 0.3 0.0 - 1.2 mg/dL   Alkaline Phosphatase 71 44 - 121 IU/L   AST 14 0 - 40 IU/L   ALT 10 0 - 32 IU/L  TSH     Status: None   Collection Time: 01/20/23 11:36 AM  Result Value Ref Range   TSH 3.250 0.450 - 4.500 uIU/mL  Vitamin B12     Status: None   Collection Time: 01/20/23 11:36 AM  Result Value Ref Range   Vitamin B-12 376 232 - 1,245 pg/mL       Assessment & Plan:   Problem List Items  Addressed This Visit       Active Problems   Hypothyroidism    Patient is seen by Endocrine, who manage this condition.  She is well controlled with current therapy.   Will defer to them for further changes to plan of care.      Relevant Orders   CMP14+EGFR (Completed)   TSH (Completed)   Hypertension    Blood pressure well controlled with current medications.  Continue current therapy.  Will reassess at follow up.       Relevant Medications   ORENITRAM MONTH 3 0.125 & 0.25 &1 MG TEPK   ORENITRAM 1 MG TBCR   ORENITRAM 0.25 MG TBCR   Other Relevant Orders   CMP14+EGFR (Completed)   Type 2 diabetes mellitus with hyperglycemia (HCC) - Primary    Patient is seen by endocrine, who manage this condition.  She is well controlled with current therapy.   Will defer to them for further changes to plan of care.      Relevant Orders   POCT CBG (Fasting - Glucose) (Completed)   CMP14+EGFR (Completed)   Hyperlipidemia    Checking labs today.  Continue current therapy for lipid control. Will modify as needed based on labwork results.       Relevant Medications   ORENITRAM MONTH 3 0.125 & 0.25 &1 MG TEPK   ORENITRAM 1 MG TBCR   ORENITRAM 0.25 MG TBCR   Other Relevant Orders   Lipid panel (Completed)   CMP14+EGFR (Completed)   Chronic obstructive pulmonary disease, unspecified COPD type (HCC)    Patient is seen by Pulmonology, who manage this condition.  She is well controlled with current therapy.   Will defer to them for further changes to plan of care.      Relevant Orders   CMP14+EGFR (Completed)   Chronic diastolic heart failure (HCC)   Relevant Medications   ORENITRAM MONTH 3 0.125 & 0.25 &1 MG TEPK   ORENITRAM 1 MG TBCR   ORENITRAM 0.25 MG TBCR   Other Visit Diagnoses     Vitamin D deficiency, unspecified       Checking labs today.  Will continue supplements as needed.   Relevant Orders   VITAMIN D 25 Hydroxy (Vit-D Deficiency, Fractures) (Completed)    CMP14+EGFR (Completed)   B12 deficiency due to diet       Checking labs today.  Will continue supplements as needed.   Relevant Orders   CMP14+EGFR (  Completed)   Vitamin B12 (Completed)       Return in about 3 months (around 04/22/2023).   Total time spent: 30 minutes  Miki Kins, FNP  01/20/2023   This document may have been prepared by [facility] Voice Recognition software and as such may include unintentional dictation errors.

## 2023-01-21 LAB — CMP14+EGFR
ALT: 10 IU/L (ref 0–32)
AST: 14 IU/L (ref 0–40)
Albumin: 4.2 g/dL (ref 3.8–4.8)
Alkaline Phosphatase: 71 IU/L (ref 44–121)
BUN/Creatinine Ratio: 11 — ABNORMAL LOW (ref 12–28)
BUN: 18 mg/dL (ref 8–27)
Bilirubin Total: 0.3 mg/dL (ref 0.0–1.2)
CO2: 22 mmol/L (ref 20–29)
Calcium: 9.8 mg/dL (ref 8.7–10.3)
Chloride: 105 mmol/L (ref 96–106)
Creatinine, Ser: 1.62 mg/dL — ABNORMAL HIGH (ref 0.57–1.00)
Globulin, Total: 2.3 g/dL (ref 1.5–4.5)
Glucose: 75 mg/dL (ref 70–99)
Potassium: 4 mmol/L (ref 3.5–5.2)
Sodium: 142 mmol/L (ref 134–144)
Total Protein: 6.5 g/dL (ref 6.0–8.5)
eGFR: 33 mL/min/{1.73_m2} — ABNORMAL LOW (ref >59)

## 2023-01-21 LAB — VITAMIN B12: Vitamin B-12: 376 pg/mL (ref 232–1245)

## 2023-01-21 LAB — LIPID PANEL
Chol/HDL Ratio: 2.5 ratio (ref 0.0–4.4)
Cholesterol, Total: 118 mg/dL (ref 100–199)
HDL: 48 mg/dL (ref >39)
LDL Chol Calc (NIH): 50 mg/dL (ref 0–99)
Triglycerides: 107 mg/dL (ref 0–149)
VLDL Cholesterol Cal: 20 mg/dL (ref 5–40)

## 2023-01-21 LAB — VITAMIN D 25 HYDROXY (VIT D DEFICIENCY, FRACTURES): Vit D, 25-Hydroxy: 54.2 ng/mL (ref 30.0–100.0)

## 2023-01-21 LAB — TSH: TSH: 3.25 u[IU]/mL (ref 0.450–4.500)

## 2023-01-25 ENCOUNTER — Other Ambulatory Visit (HOSPITAL_COMMUNITY): Payer: Self-pay | Admitting: Cardiology

## 2023-01-25 DIAGNOSIS — I270 Primary pulmonary hypertension: Secondary | ICD-10-CM

## 2023-01-28 LAB — MICROALBUMIN / CREATININE URINE RATIO: Microalb Creat Ratio: 100

## 2023-02-01 ENCOUNTER — Ambulatory Visit
Admission: RE | Admit: 2023-02-01 | Discharge: 2023-02-01 | Disposition: A | Discharge status: Home / Self Care | Payer: Medicare PPO | Source: Ambulatory Visit | Attending: Family | Admitting: Family

## 2023-02-01 DIAGNOSIS — Z1231 Encounter for screening mammogram for malignant neoplasm of breast: Secondary | ICD-10-CM | POA: Insufficient documentation

## 2023-02-03 DIAGNOSIS — G4733 Obstructive sleep apnea (adult) (pediatric): Secondary | ICD-10-CM | POA: Diagnosis not present

## 2023-02-06 ENCOUNTER — Encounter: Payer: Self-pay | Admitting: Family

## 2023-02-06 NOTE — Assessment & Plan Note (Addendum)
Patient is seen by endocrine, who manage this condition.  She is well controlled with current therapy.   Will defer to them for further changes to plan of care.

## 2023-02-06 NOTE — Assessment & Plan Note (Signed)
Checking labs today.  Continue current therapy for lipid control. Will modify as needed based on labwork results.  

## 2023-02-06 NOTE — Assessment & Plan Note (Addendum)
 Patient is seen by Pulmonology, who manage this condition.  She is well controlled with current therapy.   Will defer to them for further changes to plan of care

## 2023-02-06 NOTE — Assessment & Plan Note (Addendum)
Patient is seen by Endocrine, who manage this condition.  She is well controlled with current therapy.   Will defer to them for further changes to plan of care.

## 2023-02-06 NOTE — Assessment & Plan Note (Signed)
Blood pressure well controlled with current medications.  Continue current therapy.  Will reassess at follow up.  

## 2023-02-13 DIAGNOSIS — E063 Autoimmune thyroiditis: Secondary | ICD-10-CM | POA: Diagnosis not present

## 2023-02-13 DIAGNOSIS — E782 Mixed hyperlipidemia: Secondary | ICD-10-CM | POA: Diagnosis not present

## 2023-02-13 DIAGNOSIS — E1142 Type 2 diabetes mellitus with diabetic polyneuropathy: Secondary | ICD-10-CM | POA: Diagnosis not present

## 2023-02-13 DIAGNOSIS — E1159 Type 2 diabetes mellitus with other circulatory complications: Secondary | ICD-10-CM | POA: Diagnosis not present

## 2023-02-13 DIAGNOSIS — I152 Hypertension secondary to endocrine disorders: Secondary | ICD-10-CM | POA: Diagnosis not present

## 2023-02-13 DIAGNOSIS — Z794 Long term (current) use of insulin: Secondary | ICD-10-CM | POA: Diagnosis not present

## 2023-02-13 LAB — HEMOGLOBIN A1C: Hemoglobin A1C: 6.2

## 2023-02-28 DIAGNOSIS — R197 Diarrhea, unspecified: Secondary | ICD-10-CM | POA: Diagnosis not present

## 2023-02-28 DIAGNOSIS — D696 Thrombocytopenia, unspecified: Secondary | ICD-10-CM | POA: Diagnosis not present

## 2023-02-28 DIAGNOSIS — R194 Change in bowel habit: Secondary | ICD-10-CM | POA: Diagnosis not present

## 2023-02-28 DIAGNOSIS — R161 Splenomegaly, not elsewhere classified: Secondary | ICD-10-CM | POA: Diagnosis not present

## 2023-02-28 DIAGNOSIS — R932 Abnormal findings on diagnostic imaging of liver and biliary tract: Secondary | ICD-10-CM | POA: Diagnosis not present

## 2023-02-28 DIAGNOSIS — K760 Fatty (change of) liver, not elsewhere classified: Secondary | ICD-10-CM | POA: Diagnosis not present

## 2023-02-28 DIAGNOSIS — K761 Chronic passive congestion of liver: Secondary | ICD-10-CM | POA: Diagnosis not present

## 2023-03-01 ENCOUNTER — Other Ambulatory Visit: Payer: Self-pay | Admitting: Nurse Practitioner

## 2023-03-01 DIAGNOSIS — K760 Fatty (change of) liver, not elsewhere classified: Secondary | ICD-10-CM

## 2023-03-06 ENCOUNTER — Ambulatory Visit
Admission: RE | Admit: 2023-03-06 | Discharge: 2023-03-06 | Disposition: A | Discharge status: Home / Self Care | Payer: Medicare PPO | Source: Ambulatory Visit | Attending: Nurse Practitioner | Admitting: Nurse Practitioner

## 2023-03-06 DIAGNOSIS — R932 Abnormal findings on diagnostic imaging of liver and biliary tract: Secondary | ICD-10-CM | POA: Diagnosis not present

## 2023-03-06 DIAGNOSIS — K760 Fatty (change of) liver, not elsewhere classified: Secondary | ICD-10-CM | POA: Insufficient documentation

## 2023-03-06 DIAGNOSIS — R161 Splenomegaly, not elsewhere classified: Secondary | ICD-10-CM | POA: Diagnosis not present

## 2023-03-06 DIAGNOSIS — G4733 Obstructive sleep apnea (adult) (pediatric): Secondary | ICD-10-CM | POA: Diagnosis not present

## 2023-03-15 ENCOUNTER — Other Ambulatory Visit: Payer: Self-pay | Admitting: Family

## 2023-03-15 DIAGNOSIS — Z0100 Encounter for examination of eyes and vision without abnormal findings: Secondary | ICD-10-CM | POA: Diagnosis not present

## 2023-03-15 DIAGNOSIS — H538 Other visual disturbances: Secondary | ICD-10-CM | POA: Diagnosis not present

## 2023-03-15 DIAGNOSIS — H35379 Puckering of macula, unspecified eye: Secondary | ICD-10-CM | POA: Diagnosis not present

## 2023-03-15 DIAGNOSIS — E113291 Type 2 diabetes mellitus with mild nonproliferative diabetic retinopathy without macular edema, right eye: Secondary | ICD-10-CM | POA: Diagnosis not present

## 2023-03-15 DIAGNOSIS — H2513 Age-related nuclear cataract, bilateral: Secondary | ICD-10-CM | POA: Diagnosis not present

## 2023-03-20 ENCOUNTER — Telehealth: Payer: Self-pay | Admitting: Family

## 2023-03-20 NOTE — Telephone Encounter (Signed)
Patient left VM that she has questions about Hep A, flu, and Covid vaccines.

## 2023-03-24 ENCOUNTER — Ambulatory Visit: Payer: Medicare PPO

## 2023-03-29 DIAGNOSIS — H2511 Age-related nuclear cataract, right eye: Secondary | ICD-10-CM | POA: Diagnosis not present

## 2023-03-30 ENCOUNTER — Encounter: Payer: Self-pay | Admitting: Ophthalmology

## 2023-03-31 ENCOUNTER — Encounter: Payer: Self-pay | Admitting: Anesthesiology

## 2023-03-31 NOTE — Discharge Instructions (Signed)

## 2023-04-03 ENCOUNTER — Encounter: Payer: Self-pay | Admitting: Ophthalmology

## 2023-04-03 NOTE — Anesthesia Preprocedure Evaluation (Addendum)
Anesthesia Evaluation    Airway        Dental   Pulmonary           Cardiovascular hypertension,   N    08-27-22   Acute Diastolic CHF I50.31    History:         Patient has prior history of Echocardiogram examinations.  COPD                  and TIA; Risk Factors:Diabetes and Dyslipidemia.    Sonographer:     L. Thornton-Maynard  Referring Phys:  [id number] Few NIU  Diagnosing Phys: Julien Nordmann MD   IMPRESSIONS     1. Left ventricular ejection fraction, by estimation, is 55 to 60%. The  left ventricle has normal function. The left ventricle has no regional  wall motion abnormalities. There is moderate concentric left ventricular  hypertrophy. Left ventricular  diastolic parameters are consistent with Grade I diastolic dysfunction  (impaired relaxation). There is the interventricular septum is flattened  in systole and diastole, consistent with right ventricular pressure and  volume overload.   2. Right ventricular systolic function is severely reduced. The right  ventricular size is severely enlarged. There is severely elevated  pulmonary artery systolic pressure. The estimated right ventricular  systolic pressure is 83.9 mmHg.   3. Right atrial size was moderately dilated.   4. The mitral valve is normal in structure. No evidence of mitral valve  regurgitation. No evidence of mitral stenosis.   5. Tricuspid valve regurgitation is severe.   6. The aortic valve is tricuspid. Aortic valve regurgitation is not  visualized. No aortic stenosis is present.   7. The inferior vena cava is dilated in size with <50% respiratory  variability, suggesting right atrial pressure of 15 mmHg.     Neuro/Psych    GI/Hepatic   Endo/Other  diabetes    Renal/GU      Musculoskeletal   Abdominal   Peds  Hematology   Anesthesia Other Findings Diabetes  (HCC)  Anxiety Osteoporosis  Anemia Hypertension  Hyperlipidemia Insomnia  Depression Rocky [place] spotted fever Allergy Transient cerebral ischemia GERD (gastroesophageal reflux disease) Migraines  Vertigo COPD (chronic obstructive pulmonary disease) (HCC)  Edema of lower extremity Elevated troponin level  Chest pain, rule out acute myocardial infarction Hypomagnesemia  Macrocytosis Hypokalemia  Diarrhea, functional Palpitations  Type 2 diabetes mellitus with hyperglycemia (HCC) History of stroke  Cerebral infarction, unspecified (HCC) Abnormal electrocardiogram  Bilateral hand pain Chest pain  Pulmonary hypertension (HCC) Sleep apnea  SEVERE pulmonary hypertension Severely enlarged RV Severely reduced RV systolic function Right Atrial Pressure 15 Severe tricuspid regurgitation    Reproductive/Obstetrics                             Anesthesia Physical Anesthesia Plan  ASA: 4  Anesthesia Plan:    Post-op Pain Management:    Induction:   PONV Risk Score and Plan:   Airway Management Planned:   Additional Equipment:   Intra-op Plan:   Post-operative Plan:   Informed Consent:   Plan Discussed with:   Anesthesia Plan Comments:         Anesthesia Quick Evaluation

## 2023-04-04 ENCOUNTER — Telehealth (HOSPITAL_COMMUNITY): Payer: Self-pay | Admitting: Pharmacy Technician

## 2023-04-04 ENCOUNTER — Ambulatory Visit: Admission: RE | Admit: 2023-04-04 | Payer: Medicare PPO | Source: Home / Self Care | Admitting: Ophthalmology

## 2023-04-04 ENCOUNTER — Encounter (HOSPITAL_COMMUNITY): Payer: Self-pay | Admitting: Cardiology

## 2023-04-04 HISTORY — DX: Rheumatic tricuspid insufficiency: I07.1

## 2023-04-04 HISTORY — DX: Secondary pulmonary arterial hypertension: I27.21

## 2023-04-04 HISTORY — DX: Fatty (change of) liver, not elsewhere classified: K76.0

## 2023-04-04 HISTORY — DX: Cardiomegaly: I51.7

## 2023-04-04 HISTORY — DX: Chronic diastolic (congestive) heart failure: I50.32

## 2023-04-04 HISTORY — DX: Sleep apnea, unspecified: G47.30

## 2023-04-04 HISTORY — DX: Chronic passive congestion of liver: K76.1

## 2023-04-04 HISTORY — DX: Pulmonary hypertension, unspecified: I27.20

## 2023-04-04 SURGERY — CATARACT EXTRACTION PHACO AND INTRAOCULAR LENS PLACEMENT (IOC)
Anesthesia: Topical | Laterality: Right

## 2023-04-04 NOTE — Telephone Encounter (Signed)
Advanced Heart Failure Patient Advocate Encounter  Patient's daughter left message about TAF re-enrollment. Called and spoke with her. Re-enrollment is from 12/04-12/20. Will check in with them during that time to confirm they are able to renew.  Archer Asa, CPhT

## 2023-04-05 DIAGNOSIS — G4733 Obstructive sleep apnea (adult) (pediatric): Secondary | ICD-10-CM | POA: Diagnosis not present

## 2023-04-11 ENCOUNTER — Other Ambulatory Visit: Payer: Self-pay | Admitting: Family

## 2023-04-12 ENCOUNTER — Other Ambulatory Visit: Payer: Self-pay | Admitting: Family

## 2023-04-12 MED ORDER — BD PEN NEEDLE MINI U/F 31G X 5 MM MISC: 1.0000 | Freq: Two times a day (BID) | 5 refills | Status: DC

## 2023-04-12 NOTE — Telephone Encounter (Signed)
Pt lvm requesting a call back but did not state what it was regarding- called pt back 11/6 @ 2:35 and lvm for pt to call back-HQ

## 2023-04-13 ENCOUNTER — Telehealth: Payer: Self-pay | Admitting: Family

## 2023-04-13 NOTE — Telephone Encounter (Signed)
Patient left VM stating that she is calling about the high dose flu vaccine and that's all she said.

## 2023-04-14 ENCOUNTER — Other Ambulatory Visit: Payer: Self-pay | Admitting: Family

## 2023-04-18 ENCOUNTER — Ambulatory Visit: Admit: 2023-04-18 | Payer: Medicare PPO | Admitting: Ophthalmology

## 2023-04-18 DIAGNOSIS — T24219A Burn of second degree of unspecified thigh, initial encounter: Secondary | ICD-10-CM | POA: Diagnosis not present

## 2023-04-18 SURGERY — CATARACT EXTRACTION PHACO AND INTRAOCULAR LENS PLACEMENT (IOC)
Anesthesia: Topical | Laterality: Left

## 2023-04-25 DIAGNOSIS — F4321 Adjustment disorder with depressed mood: Secondary | ICD-10-CM | POA: Diagnosis not present

## 2023-04-26 ENCOUNTER — Ambulatory Visit (HOSPITAL_COMMUNITY)
Admission: RE | Admit: 2023-04-26 | Discharge: 2023-04-26 | Disposition: A | Discharge status: Home / Self Care | Payer: Medicare PPO | Source: Ambulatory Visit | Attending: Cardiology | Admitting: Cardiology

## 2023-04-26 ENCOUNTER — Telehealth (HOSPITAL_COMMUNITY): Payer: Self-pay | Admitting: Pharmacist

## 2023-04-26 ENCOUNTER — Ambulatory Visit (HOSPITAL_COMMUNITY)
Admission: RE | Admit: 2023-04-26 | Discharge: 2023-04-26 | Disposition: A | Discharge status: Home / Self Care | Payer: Medicare PPO | Source: Ambulatory Visit | Attending: Family | Admitting: Family

## 2023-04-26 ENCOUNTER — Encounter (HOSPITAL_COMMUNITY): Payer: Self-pay | Admitting: Cardiology

## 2023-04-26 VITALS — BP 100/60 | HR 69 | Wt 157.0 lb

## 2023-04-26 DIAGNOSIS — R609 Edema, unspecified: Secondary | ICD-10-CM | POA: Diagnosis not present

## 2023-04-26 DIAGNOSIS — E1122 Type 2 diabetes mellitus with diabetic chronic kidney disease: Secondary | ICD-10-CM | POA: Diagnosis not present

## 2023-04-26 DIAGNOSIS — Z79899 Other long term (current) drug therapy: Secondary | ICD-10-CM | POA: Insufficient documentation

## 2023-04-26 DIAGNOSIS — M545 Low back pain, unspecified: Secondary | ICD-10-CM | POA: Diagnosis not present

## 2023-04-26 DIAGNOSIS — Z8673 Personal history of transient ischemic attack (TIA), and cerebral infarction without residual deficits: Secondary | ICD-10-CM | POA: Diagnosis not present

## 2023-04-26 DIAGNOSIS — R55 Syncope and collapse: Secondary | ICD-10-CM | POA: Insufficient documentation

## 2023-04-26 DIAGNOSIS — G4733 Obstructive sleep apnea (adult) (pediatric): Secondary | ICD-10-CM | POA: Diagnosis not present

## 2023-04-26 DIAGNOSIS — I2721 Secondary pulmonary arterial hypertension: Secondary | ICD-10-CM | POA: Diagnosis not present

## 2023-04-26 DIAGNOSIS — I272 Pulmonary hypertension, unspecified: Secondary | ICD-10-CM | POA: Diagnosis not present

## 2023-04-26 DIAGNOSIS — K7460 Unspecified cirrhosis of liver: Secondary | ICD-10-CM | POA: Diagnosis not present

## 2023-04-26 DIAGNOSIS — R7982 Elevated C-reactive protein (CRP): Secondary | ICD-10-CM | POA: Insufficient documentation

## 2023-04-26 DIAGNOSIS — Z794 Long term (current) use of insulin: Secondary | ICD-10-CM | POA: Diagnosis not present

## 2023-04-26 DIAGNOSIS — J449 Chronic obstructive pulmonary disease, unspecified: Secondary | ICD-10-CM

## 2023-04-26 DIAGNOSIS — R700 Elevated erythrocyte sedimentation rate: Secondary | ICD-10-CM | POA: Insufficient documentation

## 2023-04-26 DIAGNOSIS — I5032 Chronic diastolic (congestive) heart failure: Secondary | ICD-10-CM

## 2023-04-26 DIAGNOSIS — I130 Hypertensive heart and chronic kidney disease with heart failure and stage 1 through stage 4 chronic kidney disease, or unspecified chronic kidney disease: Secondary | ICD-10-CM | POA: Diagnosis not present

## 2023-04-26 DIAGNOSIS — E785 Hyperlipidemia, unspecified: Secondary | ICD-10-CM | POA: Insufficient documentation

## 2023-04-26 DIAGNOSIS — Z7984 Long term (current) use of oral hypoglycemic drugs: Secondary | ICD-10-CM | POA: Diagnosis not present

## 2023-04-26 DIAGNOSIS — N183 Chronic kidney disease, stage 3 unspecified: Secondary | ICD-10-CM | POA: Insufficient documentation

## 2023-04-26 DIAGNOSIS — R42 Dizziness and giddiness: Secondary | ICD-10-CM | POA: Insufficient documentation

## 2023-04-26 DIAGNOSIS — J984 Other disorders of lung: Secondary | ICD-10-CM | POA: Diagnosis not present

## 2023-04-26 LAB — ECHOCARDIOGRAM COMPLETE
AR max vel: 1.86 cm2
AV Area VTI: 2.18 cm2
AV Area mean vel: 2.01 cm2
AV Mean grad: 4 mm[Hg]
AV Peak grad: 8.5 mm[Hg]
Ao pk vel: 1.46 m/s
Area-P 1/2: 2.17 cm2
Calc EF: 57.8 %
MV VTI: 3.08 cm2
S' Lateral: 2.3 cm
Single Plane A2C EF: 57.9 %
Single Plane A4C EF: 61 %

## 2023-04-26 LAB — BASIC METABOLIC PANEL
Anion gap: 9 (ref 5–15)
BUN: 16 mg/dL (ref 8–23)
CO2: 24 mmol/L (ref 22–32)
Calcium: 9.6 mg/dL (ref 8.9–10.3)
Chloride: 106 mmol/L (ref 98–111)
Creatinine, Ser: 1.43 mg/dL — ABNORMAL HIGH (ref 0.44–1.00)
GFR, Estimated: 38 mL/min — ABNORMAL LOW (ref >60)
Glucose, Bld: 117 mg/dL — ABNORMAL HIGH (ref 70–99)
Potassium: 3.7 mmol/L (ref 3.5–5.1)
Sodium: 139 mmol/L (ref 135–145)

## 2023-04-26 LAB — BRAIN NATRIURETIC PEPTIDE: B Natriuretic Peptide: 115.6 pg/mL — ABNORMAL HIGH (ref 0.0–100.0)

## 2023-04-26 NOTE — Patient Instructions (Signed)
You will be contacted about increasing your orenitaram.  STOP Hydralazine   Labs done today, your results will be available in MyChart, we will contact you for abnormal readings.  You have been referred to Pulmonary rehab at [facility]. You will be called to have your appointment scheduled.  Your physician recommends that you schedule a follow-up appointment in: 3 months at the [facility] office (February 2025) ** PLEASE CALL THE OFFICE IN MID DECEMBER TO ARRANGE YOUR FOLLOW UP APPOINTMENT AT [phone]**  If you have any questions or concerns before your next appointment please send [place] a message through [place] or call our office at [phone].    TO LEAVE A MESSAGE FOR THE NURSE SELECT OPTION 2, PLEASE LEAVE A MESSAGE INCLUDING: YOUR NAME DATE OF BIRTH CALL BACK NUMBER REASON FOR CALL**this is important as we prioritize the call backs  YOU WILL RECEIVE A CALL BACK THE SAME DAY AS LONG AS YOU CALL BEFORE 4:00 PM  At the [facility], you and your health needs are our priority. As part of our continuing mission to provide you with exceptional heart care, we have created designated Provider Care Teams. These Care Teams include your primary Cardiologist (physician) and Advanced Practice Providers (APPs- Physician Assistants and Nurse Practitioners) who all work together to provide you with the care you need, when you need it.   You may see any of the following providers on your designated Care Team at your next follow up: Dr Arvilla Meres Dr Marca Ancona Dr. Dorthula Nettles Dr. Clearnce Hasten Amy Filbert Schilder, NP Robbie Lis, [place] [facility] [place], [place] Brynda Peon, NP [place] Lee, NP Karle Plumber, PharmD   Please be sure to bring in all your medications bottles to every appointment.    Thank you for choosing [facility]

## 2023-04-26 NOTE — Telephone Encounter (Signed)
Received message from Dr. Shirlee Latch that he would like patient to continue to increase orenitram as tolerated to a target dose of 6 mg TID. Called [facility] and provided verbal orders. They will reach out to patient to continue titration. Per their records, her last reported dose was 2.25 mg TID.   Karle Plumber, PharmD, BCPS, BCCP, CPP Heart Failure Clinic Pharmacist [phone]
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
## 2023-04-26 NOTE — Progress Notes (Signed)
*  PRELIMINARY RESULTS* Echocardiogram 2D Echocardiogram has been performed.  Jamie Murray 04/26/2023, 11:23 AM

## 2023-04-27 ENCOUNTER — Ambulatory Visit: Payer: Medicare PPO | Admitting: Family

## 2023-04-27 ENCOUNTER — Encounter: Payer: Self-pay | Admitting: Family

## 2023-04-27 VITALS — BP 120/78 | HR 64 | Ht 67.0 in | Wt 156.8 lb

## 2023-04-27 DIAGNOSIS — T310 Burns involving less than 10% of body surface: Secondary | ICD-10-CM | POA: Diagnosis not present

## 2023-04-27 DIAGNOSIS — E1165 Type 2 diabetes mellitus with hyperglycemia: Secondary | ICD-10-CM

## 2023-04-27 DIAGNOSIS — Z794 Long term (current) use of insulin: Secondary | ICD-10-CM | POA: Diagnosis not present

## 2023-04-27 DIAGNOSIS — Z0130 Encounter for examination of blood pressure without abnormal findings: Secondary | ICD-10-CM

## 2023-04-27 LAB — GLUCOSE, POCT (MANUAL RESULT ENTRY): POC Glucose: 164 mg/dL — AB (ref 70–99)

## 2023-04-27 MED ORDER — SILVER SULFADIAZINE 1 % EX CREA: TOPICAL_CREAM | CUTANEOUS | 1 refills | Status: DC

## 2023-05-02 DIAGNOSIS — F4321 Adjustment disorder with depressed mood: Secondary | ICD-10-CM | POA: Diagnosis not present

## 2023-05-03 ENCOUNTER — Other Ambulatory Visit: Payer: Self-pay | Admitting: *Deleted

## 2023-05-03 DIAGNOSIS — I272 Pulmonary hypertension, unspecified: Secondary | ICD-10-CM

## 2023-05-06 DIAGNOSIS — G4733 Obstructive sleep apnea (adult) (pediatric): Secondary | ICD-10-CM | POA: Diagnosis not present

## 2023-05-08 ENCOUNTER — Other Ambulatory Visit (HOSPITAL_COMMUNITY): Payer: Self-pay | Admitting: Cardiology

## 2023-05-08 DIAGNOSIS — I270 Primary pulmonary hypertension: Secondary | ICD-10-CM

## 2023-05-09 ENCOUNTER — Telehealth (HOSPITAL_COMMUNITY): Payer: Self-pay | Admitting: Pharmacy Technician

## 2023-05-09 NOTE — Telephone Encounter (Signed)
Advanced Heart Failure Patient Advocate Encounter  Called and spoke with patients daughter regarding [employer] for 2025. The patient is aware that a pin should be sent via text sometime tomorrow. We will use that pin on their website to re-apply for next year. I advised the patient that I can do this on their behalf, or they can enter the information in themselves.  Will follow up.   Archer Asa, CPhT

## 2023-05-12 ENCOUNTER — Ambulatory Visit: Payer: Medicare PPO | Admitting: Family

## 2023-05-12 ENCOUNTER — Encounter: Payer: Self-pay | Admitting: Family

## 2023-05-12 VITALS — BP 115/60 | HR 67 | Ht 67.0 in | Wt 162.6 lb

## 2023-05-12 DIAGNOSIS — Z794 Long term (current) use of insulin: Secondary | ICD-10-CM | POA: Diagnosis not present

## 2023-05-12 DIAGNOSIS — E1165 Type 2 diabetes mellitus with hyperglycemia: Secondary | ICD-10-CM

## 2023-05-12 DIAGNOSIS — F419 Anxiety disorder, unspecified: Secondary | ICD-10-CM

## 2023-05-12 DIAGNOSIS — F32A Depression, unspecified: Secondary | ICD-10-CM | POA: Diagnosis not present

## 2023-05-12 DIAGNOSIS — T24291D Burn of second degree of multiple sites of right lower limb, except ankle and foot, subsequent encounter: Secondary | ICD-10-CM

## 2023-05-12 DIAGNOSIS — Z0130 Encounter for examination of blood pressure without abnormal findings: Secondary | ICD-10-CM

## 2023-05-12 LAB — GLUCOSE, POCT (MANUAL RESULT ENTRY): POC Glucose: 109 mg/dL — AB (ref 70–99)

## 2023-05-14 ENCOUNTER — Encounter: Payer: Self-pay | Admitting: Family

## 2023-05-14 NOTE — Assessment & Plan Note (Signed)
Patient is now seeing a counselor, and continues to take her medications.  She is doing well with her current regimen.   Reassess at follow up.
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
## 2023-05-14 NOTE — Assessment & Plan Note (Signed)
Patient is seen by Endocrinology, who manage this condition.  She is well controlled with current therapy.   Will defer to them for further changes to plan of care.

## 2023-05-16 DIAGNOSIS — F4321 Adjustment disorder with depressed mood: Secondary | ICD-10-CM | POA: Diagnosis not present

## 2023-05-18 DIAGNOSIS — E1122 Type 2 diabetes mellitus with diabetic chronic kidney disease: Secondary | ICD-10-CM | POA: Diagnosis not present

## 2023-05-18 DIAGNOSIS — N1832 Chronic kidney disease, stage 3b: Secondary | ICD-10-CM | POA: Diagnosis not present

## 2023-05-18 DIAGNOSIS — I10 Essential (primary) hypertension: Secondary | ICD-10-CM | POA: Diagnosis not present

## 2023-05-19 DIAGNOSIS — H04129 Dry eye syndrome of unspecified lacrimal gland: Secondary | ICD-10-CM | POA: Diagnosis not present

## 2023-05-19 DIAGNOSIS — H1012 Acute atopic conjunctivitis, left eye: Secondary | ICD-10-CM | POA: Diagnosis not present

## 2023-05-22 DIAGNOSIS — I10 Essential (primary) hypertension: Secondary | ICD-10-CM | POA: Diagnosis not present

## 2023-05-22 DIAGNOSIS — N1831 Chronic kidney disease, stage 3a: Secondary | ICD-10-CM | POA: Diagnosis not present

## 2023-05-22 DIAGNOSIS — E1122 Type 2 diabetes mellitus with diabetic chronic kidney disease: Secondary | ICD-10-CM | POA: Diagnosis not present

## 2023-05-22 DIAGNOSIS — D631 Anemia in chronic kidney disease: Secondary | ICD-10-CM | POA: Diagnosis not present

## 2023-05-23 ENCOUNTER — Encounter: Payer: Medicare PPO | Attending: Cardiology | Admitting: *Deleted

## 2023-05-23 DIAGNOSIS — I272 Pulmonary hypertension, unspecified: Secondary | ICD-10-CM | POA: Insufficient documentation

## 2023-05-23 DIAGNOSIS — Z79899 Other long term (current) drug therapy: Secondary | ICD-10-CM | POA: Insufficient documentation

## 2023-05-23 NOTE — Progress Notes (Signed)
Initial phone call completed. Diagnosis can be found in [facility] 11/20. EP Orientation scheduled for Wednesday 12/18 at 10:30.

## 2023-05-24 ENCOUNTER — Encounter: Payer: Medicare PPO | Admitting: *Deleted

## 2023-05-24 VITALS — Ht 67.0 in | Wt 160.5 lb

## 2023-05-24 DIAGNOSIS — Z79899 Other long term (current) drug therapy: Secondary | ICD-10-CM | POA: Diagnosis not present

## 2023-05-24 DIAGNOSIS — I272 Pulmonary hypertension, unspecified: Secondary | ICD-10-CM | POA: Diagnosis not present

## 2023-05-24 NOTE — Patient Instructions (Signed)
Patient Instructions  Patient Details  Name: Jamie Murray MRN: [id number] Date of Birth: Nov 25, 1945 Referring Provider:  Laurey Morale, MD  Below are your personal goals for exercise, nutrition, and risk factors. Our goal is to help you stay on track towards obtaining and maintaining these goals. We will be discussing your progress on these goals with you throughout the program.  Initial Exercise Prescription:  Initial Exercise Prescription - 05/24/23 1200       Date of Initial Exercise RX and Referring Provider   Date 05/24/23    Referring Provider Dr. Marca Ancona      Oxygen   Maintain Oxygen Saturation 88% or higher      Recumbant Bike   Level 1    RPM 50    Watts 15    Minutes 15    METs 2.19      NuStep   Level 1    SPM 80    Minutes 15    METs 2.19      REL-XR   Level 1    Speed 50    Minutes 15    METs 2.19      Biostep-RELP   Level 1    SPM 50    Minutes 15    METs 2.19      Track   Laps 22    Minutes 15    METs 2.2      Prescription Details   Frequency (times per week) 2    Duration Progress to 30 minutes of continuous aerobic without signs/symptoms of physical distress      Intensity   THRR 40-80% of Max Heartrate 98-128    Ratings of Perceived Exertion 11-13    Perceived Dyspnea 0-4      Progression   Progression Continue to progress workloads to maintain intensity without signs/symptoms of physical distress.      Resistance Training   Training Prescription Yes    Weight 2    Reps 10-15             Exercise Goals: Frequency: Be able to perform aerobic exercise two to three times per week in program working toward 2-5 days per week of home exercise.  Intensity: Work with a perceived exertion of 11 (fairly light) - 15 (hard) while following your exercise prescription.  We will make changes to your prescription with you as you progress through the program.   Duration: Be able to do 30 to 45 minutes of continuous  aerobic exercise in addition to a 5 minute warm-up and a 5 minute cool-down routine.   Nutrition Goals: Your personal nutrition goals will be established when you do your nutrition analysis with the dietician.  The following are general nutrition guidelines to follow: Cholesterol < 200mg /day Sodium < 1500mg /day Fiber: Women over 50 yrs - 21 grams per day  Personal Goals:  Personal Goals and Risk Factors at Admission - 05/24/23 1237       Core Components/Risk Factors/Patient Goals on Admission    Weight Management Yes;Weight Maintenance    Intervention Weight Management: Develop a combined nutrition and exercise program designed to reach desired caloric intake, while maintaining appropriate intake of nutrient and fiber, sodium and fats, and appropriate energy expenditure required for the weight goal.;Weight Management: Provide education and appropriate resources to help participant work on and attain dietary goals.;Weight Management/Obesity: Establish reasonable short term and long term weight goals.    Admit Weight 160 lb 8 oz (72.8 kg)  Goal Weight: Short Term 160 lb (72.6 kg)    Goal Weight: Long Term 160 lb (72.6 kg)    Expected Outcomes Short Term: Continue to assess and modify interventions until short term weight is achieved;Long Term: Adherence to nutrition and physical activity/exercise program aimed toward attainment of established weight goal;Weight Maintenance: Understanding of the daily nutrition guidelines, which includes 25-35% calories from fat, 7% or less cal from saturated fats, less than 200mg  cholesterol, less than 1.5gm of sodium, & 5 or more servings of fruits and vegetables daily;Understanding recommendations for meals to include 15-35% energy as protein, 25-35% energy from fat, 35-60% energy from carbohydrates, less than 200mg  of dietary cholesterol, 20-35 gm of total fiber daily;Understanding of distribution of calorie intake throughout the day with the consumption of  4-5 meals/snacks    Improve shortness of breath with ADL's Yes    Intervention Provide education, individualized exercise plan and daily activity instruction to help decrease symptoms of SOB with activities of daily living.    Expected Outcomes Short Term: Improve cardiorespiratory fitness to achieve a reduction of symptoms when performing ADLs;Long Term: Be able to perform more ADLs without symptoms or delay the onset of symptoms    Diabetes Yes    Intervention Provide education about signs/symptoms and action to take for hypo/hyperglycemia.;Provide education about proper nutrition, including hydration, and aerobic/resistive exercise prescription along with prescribed medications to achieve blood glucose in normal ranges: Fasting glucose 65-99 mg/dL    Expected Outcomes Short Term: Participant verbalizes understanding of the signs/symptoms and immediate care of hyper/hypoglycemia, proper foot care and importance of medication, aerobic/resistive exercise and nutrition plan for blood glucose control.;Long Term: Attainment of HbA1C < 7%.    Heart Failure Yes    Intervention Provide a combined exercise and nutrition program that is supplemented with education, support and counseling about heart failure. Directed toward relieving symptoms such as shortness of breath, decreased exercise tolerance, and extremity edema.    Expected Outcomes Improve functional capacity of life;Short term: Attendance in program 2-3 days a week with increased exercise capacity. Reported lower sodium intake. Reported increased fruit and vegetable intake. Reports medication compliance.;Short term: Daily weights obtained and reported for increase. Utilizing diuretic protocols set by physician.;Long term: Adoption of self-care skills and reduction of barriers for early signs and symptoms recognition and intervention leading to self-care maintenance.    Hypertension Yes    Intervention Provide education on lifestyle modifcations  including regular physical activity/exercise, weight management, moderate sodium restriction and increased consumption of fresh fruit, vegetables, and low fat dairy, alcohol moderation, and smoking cessation.;Monitor prescription use compliance.    Expected Outcomes Short Term: Continued assessment and intervention until BP is < 140/71mm HG in hypertensive participants. < 130/50mm HG in hypertensive participants with diabetes, heart failure or chronic kidney disease.;Long Term: Maintenance of blood pressure at goal levels.    Lipids Yes    Intervention Provide education and support for participant on nutrition & aerobic/resistive exercise along with prescribed medications to achieve LDL 70mg , HDL >40mg .    Expected Outcomes Short Term: Participant states understanding of desired cholesterol values and is compliant with medications prescribed. Participant is following exercise prescription and nutrition guidelines.;Long Term: Cholesterol controlled with medications as prescribed, with individualized exercise RX and with personalized nutrition plan. Value goals: LDL < 70mg , HDL > 40 mg.             Tobacco Use Initial Evaluation: Social History   Tobacco Use  Smoking Status Never  Smokeless Tobacco Never  Exercise Goals and Review:  Exercise Goals     Row Name 05/24/23 1231             Exercise Goals   Increase Physical Activity Yes       Intervention Provide advice, education, support and counseling about physical activity/exercise needs.;Develop an individualized exercise prescription for aerobic and resistive training based on initial evaluation findings, risk stratification, comorbidities and participant's personal goals.       Expected Outcomes Short Term: Attend rehab on a regular basis to increase amount of physical activity.;Long Term: Add in home exercise to make exercise part of routine and to increase amount of physical activity.;Long Term: Exercising regularly at least  3-5 days a week.       Increase Strength and Stamina Yes       Intervention Provide advice, education, support and counseling about physical activity/exercise needs.;Develop an individualized exercise prescription for aerobic and resistive training based on initial evaluation findings, risk stratification, comorbidities and participant's personal goals.       Expected Outcomes Short Term: Increase workloads from initial exercise prescription for resistance, speed, and METs.;Short Term: Perform resistance training exercises routinely during rehab and add in resistance training at home;Long Term: Improve cardiorespiratory fitness, muscular endurance and strength as measured by increased METs and functional capacity ( )       Able to understand and use rate of perceived exertion (RPE) scale Yes       Intervention Provide education and explanation on how to use RPE scale       Expected Outcomes Short Term: Able to use RPE daily in rehab to express subjective intensity level;Long Term:  Able to use RPE to guide intensity level when exercising independently       Able to understand and use Dyspnea scale Yes       Intervention Provide education and explanation on how to use Dyspnea scale       Expected Outcomes Short Term: Able to use Dyspnea scale daily in rehab to express subjective sense of shortness of breath during exertion;Long Term: Able to use Dyspnea scale to guide intensity level when exercising independently       Knowledge and understanding of Target Heart Rate Range (THRR) Yes       Intervention Provide education and explanation of THRR including how the numbers were predicted and where they are located for reference       Expected Outcomes Short Term: Able to state/look up THRR;Long Term: Able to use THRR to govern intensity when exercising independently;Short Term: Able to use daily as guideline for intensity in rehab       Able to check pulse independently Yes       Intervention Provide  education and demonstration on how to check pulse in carotid and radial arteries.;Review the importance of being able to check your own pulse for safety during independent exercise       Expected Outcomes Short Term: Able to explain why pulse checking is important during independent exercise;Long Term: Able to check pulse independently and accurately       Understanding of Exercise Prescription Yes       Intervention Provide education, explanation, and written materials on patient's individual exercise prescription       Expected Outcomes Short Term: Able to explain program exercise prescription;Long Term: Able to explain home exercise prescription to exercise independently                Copy of goals given to participant.

## 2023-05-24 NOTE — Progress Notes (Signed)
Pulmonary Individual Treatment Plan  Patient Details  Name: Jamie Murray MRN: [id number] Date of Birth: 1945/07/26 Referring Provider:   Flowsheet Row Pulmonary Rehab from 05/24/2023 in [facility] Cardiac and Pulmonary Rehab  Referring Provider Dr. Marca Ancona       Initial Encounter Date:  Flowsheet Row Pulmonary Rehab from 05/24/2023 in [facility] Cardiac and Pulmonary Rehab  Date 05/24/23       Visit Diagnosis: Pulmonary hypertension (HCC)  Patient's Home Medications on Admission:  Current Outpatient Medications:    ACCU-CHEK GUIDE test strip, 3 each by Other route 3 (three) times daily as needed for other (low blood sugar symptoms)., Disp: 100 each, Rfl: 6   ambrisentan (LETAIRIS) 10 MG tablet, TAKE 1 TABLET BY MOUTH EVERY DAY, Disp: 30 tablet, Rfl: 3   amLODipine (NORVASC) 10 MG tablet, Take 1 tablet (10 mg total) by mouth daily., Disp: 90 tablet, Rfl: 3   aspirin 81 MG tablet, Take 81 mg by mouth daily., Disp: , Rfl:    Cholecalciferol (VITAMIN D3) 125 MCG (5000 UT) CAPS, Take 1 capsule by mouth daily., Disp: , Rfl:    clonazePAM (KLONOPIN) 1 MG tablet, TAKE 1 TABLET BY MOUTH UP TO TWICE DAILY AS NEEDED FOR ANXIETY, Disp: 60 tablet, Rfl: 2   dapagliflozin propanediol (FARXIGA) 10 MG TABS tablet, Take 1 tablet (10 mg total) by mouth daily before breakfast., Disp: 90 tablet, Rfl: 3   diphenoxylate-atropine (LOMOTIL) 2.5-0.025 MG tablet, Take 1 tablet by mouth 4 (four) times daily as needed for diarrhea or loose stools., Disp: 120 tablet, Rfl: 2   escitalopram (LEXAPRO) 10 MG tablet, TAKE 1 TABLET BY MOUTH EVERY MORNING, Disp: 90 tablet, Rfl: 1   esomeprazole (NEXIUM) 40 MG capsule, TAKE 1 CAPSULE BY MOUTH EVERY DAY, Disp: 90 capsule, Rfl: 1   Fe Fum-FA-B Cmp-C-Zn-Mg-Mn-Cu (HEMOCYTE PLUS) 106-1 MG CAPS, Take 1 capsule by mouth daily., Disp: 90 capsule, Rfl: 1   furosemide (LASIX) 20 MG tablet, Take 2 tablets (40 mg total) by mouth 2 (two) times daily., Disp: 90 tablet, Rfl: 0    insulin aspart (NOVOLOG) 100 UNIT/ML injection, Inject 4 Units into the skin. Bs greater than 200, Disp: , Rfl:    Insulin Degludec (TRESIBA Marlin), Inject 14 Units into the skin at bedtime., Disp: , Rfl:    Insulin Pen Needle (B-D UF III MINI PEN NEEDLES) 31G X 5 MM MISC, 1 Needle by Other route 2 (two) times daily., Disp: 100 each, Rfl: 5   levothyroxine (SYNTHROID) 75 MCG tablet, TAKE 1 TABLET BY MOUTH EVERY MORNING ON AN EMPTY STOMACH, Disp: 90 tablet, Rfl: 1   loratadine (CLARITIN) 10 MG tablet, Take 10 mg by mouth daily. , Disp: , Rfl:    meclizine (ANTIVERT) 25 MG tablet, Take 25 mg by mouth 3 (three) times daily as needed for dizziness. , Disp: , Rfl:    metoprolol succinate (TOPROL-XL) 25 MG 24 hr tablet, Take 1 tablet (25 mg total) by mouth daily., Disp: 90 tablet, Rfl: 3   Multiple Vitamins-Minerals (MULTI ADULT GUMMIES) CHEW, Chew by mouth daily., Disp: , Rfl:    ORENITRAM 0.25 MG TBCR, Take by mouth., Disp: , Rfl:    ORENITRAM 1 MG TBCR, Take by mouth., Disp: , Rfl:    ORENITRAM MONTH 3 0.125 & 0.25 &1 MG TEPK, Take 3 tablets by mouth 3 (three) times daily., Disp: , Rfl:    potassium chloride (KLOR-CON M) 10 MEQ tablet, Take 10 mEq by mouth daily., Disp: , Rfl:  rosuvastatin (CRESTOR) 10 MG tablet, Take 10 mg by mouth daily., Disp: , Rfl:    silver sulfADIAZINE (SILVADENE) 1 % cream, Apply to affected area daily, Disp: 400 g, Rfl: 1   tadalafil, PAH, (ADCIRCA) 20 MG tablet, Take 2 tablets (40 mg total) by mouth daily., Disp: 60 tablet, Rfl: 11   Treprostinil Diolamine ER (ORENITRAM) 0.125 MG TBCR, Take 1 tablet by mouth 3 (three) times daily., Disp: , Rfl:   Past Medical History: Past Medical History:  Diagnosis Date   Abnormal electrocardiogram 11/25/2013   Formatting of this note might be different from the original. Last Assessment & Plan: Resolved when went to the ER.  Will f/u with Dr. Welton Flakes Note: Unchanged Formatting of this note might be different from the original. Note:  Unchanged Formatting of this note might be different from the original.  Formatting of this note might be different from the original. Last Assessment & Plan: Resolved when we   Allergy    Anemia    Anxiety    Bilateral hand pain 07/25/2018   Cerebral infarction, unspecified (HCC) 01/26/2015   Chest pain 08/09/2016   Chest pain, rule out acute myocardial infarction 08/09/2016   Chronic diastolic heart failure (HCC)    COPD (chronic obstructive pulmonary disease) (HCC)    Depression    Diabetes (HCC)    Diarrhea, functional 01/25/2016   Edema of lower extremity 02/03/2020   Elevated troponin level 01/09/2020   Enlarged RV (right ventricle)    GERD (gastroesophageal reflux disease)    Hepatic steatosis    History of stroke 08/26/2022   Formatting of this note might be different from the original. Last Assessment & Plan:  Formatting of this note might be different from the original.  Continue ASA, statin     Hyperlipidemia    Hypertension    Hypokalemia 12/22/2015   Hypomagnesemia 03/22/2016   Insomnia    Macrocytosis 01/25/2016   Migraines    Nutmeg liver    Osteoporosis    Palpitations 11/25/2013   Formatting of this note might be different from the original. Note: Unchanged   Pulmonary hypertension (HCC)    [facility] spotted fever    Severe pulmonary arterial systolic hypertension (HCC)    Severe tricuspid regurgitation by prior echocardiogram    Sleep apnea    CPAP   Transient cerebral ischemia 2016   Type 2 diabetes mellitus with hyperglycemia (HCC) 02/14/2012   Formatting of this note might be different from the original. Last Assessment & Plan: Hgb A1C is 7.1%.  Don't make any changes now.  Will hopefully increase exercise when pulmonary hypertension is properly treated.  ? Pulmonary rehab Note: Unchanged Formatting of this note might be different from the original. Formatting of this note might be different from the original.  Formatting of this note    Vertigo     every 2-3 months    Tobacco Use: Social History   Tobacco Use  Smoking Status Never  Smokeless Tobacco Never    Labs: Review Flowsheet  More data exists      Latest Ref Rng & Units 07/03/2020 07/15/2022 10/20/2022 01/20/2023 02/13/2023  Labs for ITP Cardiac and Pulmonary Rehab  Cholestrol 100 - 199 mg/dL 191  478  295  621  -  LDL (calc) 0 - 99 mg/dL 55  70  75  50  -  HDL-C >39 mg/dL 43  42  48  48  -  Trlycerides 0 - 149 mg/dL 308  657  100  107  -  Hemoglobin A1c - - 6.3  8.2  - 6.2        Details       This result is from an external source.          Pulmonary Assessment Scores:  Pulmonary Assessment Scores     Row Name 05/24/23 1238         ADL UCSD   ADL Phase Entry     SOB Score total 5     Rest 0     Walk 1     Stairs 0     Bath 0     Dress 0     Shop 1       CAT Score   CAT Score 4       mMRC Score   mMRC Score 3              UCSD: Self-administered rating of dyspnea associated with activities of daily living (ADLs) 6-point scale (0 = "not at all" to 5 = "maximal or unable to do because of breathlessness")  Scoring Scores range from 0 to 120.  Minimally important difference is 5 units  CAT: CAT can identify the health impairment of COPD patients and is better correlated with disease progression.  CAT has a scoring range of zero to 40. The CAT score is classified into four groups of low (less than 10), medium (10 - 20), high (21-30) and very high (31-40) based on the impact level of disease on health status. A CAT score over 10 suggests significant symptoms.  A worsening CAT score could be explained by an exacerbation, poor medication adherence, poor inhaler technique, or progression of COPD or comorbid conditions.  CAT MCID is 2 points  mMRC: mMRC (Modified Medical Research Council) Dyspnea Scale is used to assess the degree of baseline functional disability in patients of respiratory disease due to dyspnea. No minimal important difference is  established. A decrease in score of 1 point or greater is considered a positive change.   Pulmonary Function Assessment:   Exercise Target Goals: Exercise Program Goal: Individual exercise prescription set using results from initial 6 min walk test and THRR while considering  patient's activity barriers and safety.   Exercise Prescription Goal: Initial exercise prescription builds to 30-45 minutes a day of aerobic activity, 2-3 days per week.  Home exercise guidelines will be given to patient during program as part of exercise prescription that the participant will acknowledge.  Education: Aerobic Exercise: - Group verbal and visual presentation on the components of exercise prescription. Introduces F.I.T.T principle from ACSM for exercise prescriptions.  Reviews F.I.T.T. principles of aerobic exercise including progression. Written material given at graduation. Flowsheet Row Pulmonary Rehab from 05/24/2023 in [facility] Cardiac and Pulmonary Rehab  Education need identified 05/24/23       Education: Resistance Exercise: - Group verbal and visual presentation on the components of exercise prescription. Introduces F.I.T.T principle from ACSM for exercise prescriptions  Reviews F.I.T.T. principles of resistance exercise including progression. Written material given at graduation. Flowsheet Row Pulmonary Rehab from 12/03/2020 in [facility] Cardiac and Pulmonary Rehab  Date 12/03/20  Educator [facility]  Instruction Review Code 1- Verbalizes Understanding        Education: Exercise & Equipment Safety: - Individual verbal instruction and demonstration of equipment use and safety with use of the equipment. Flowsheet Row Pulmonary Rehab from 05/24/2023 in [facility] Cardiac and Pulmonary Rehab  Date 05/24/23  Educator [facility]  Instruction Review  Code 1- Verbalizes Understanding       Education: Exercise Physiology & General Exercise Guidelines: - Group verbal and written instruction with models to review the  exercise physiology of the cardiovascular system and associated critical values. Provides general exercise guidelines with specific guidelines to those with heart or lung disease.  Flowsheet Row Pulmonary Rehab from 05/24/2023 in [facility] Cardiac and Pulmonary Rehab  Education need identified 05/24/23       Education: Flexibility, Balance, Mind/Body Relaxation: - Group verbal and visual presentation with interactive activity on the components of exercise prescription. Introduces F.I.T.T principle from ACSM for exercise prescriptions. Reviews F.I.T.T. principles of flexibility and balance exercise training including progression. Also discusses the mind body connection.  Reviews various relaxation techniques to help reduce and manage stress (i.e. Deep breathing, progressive muscle relaxation, and visualization). Balance handout provided to take home. Written material given at graduation. Flowsheet Row Pulmonary Rehab from 12/03/2020 in [facility] Cardiac and Pulmonary Rehab  Date 10/08/20  Educator [facility]  Instruction Review Code 1- Verbalizes Understanding       Activity Barriers & Risk Stratification:  Activity Barriers & Cardiac Risk Stratification - 05/24/23 1227       Activity Barriers & Cardiac Risk Stratification   Activity Barriers Back Problems;Muscular Weakness;Assistive Device;Balance Concerns             6 Minute Walk:  6 Minute Walk     Row Name 05/24/23 1221         6 Minute Walk   Phase Initial     Distance 900 feet     Walk Time 6 minutes     # of Rest Breaks 0     MPH 1.7     METS 2.19     RPE 11     Perceived Dyspnea  0     VO2 Peak 7.65     Symptoms No     Resting HR 64 bpm     Resting BP 138/82     Resting Oxygen Saturation  98 %     Exercise Oxygen Saturation  during 6 min walk 96 %     Max Ex. HR 109 bpm     Max Ex. BP 142/80     2 Minute Post BP 130/60       Interval HR   1 Minute HR 80     2 Minute HR 93     3 Minute HR 100     4 Minute HR 103     5  Minute HR 106     6 Minute HR 109     2 Minute Post HR 65     Interval Heart Rate? Yes       Interval Oxygen   Interval Oxygen? Yes     Baseline Oxygen Saturation % 98 %     1 Minute Oxygen Saturation % 96 %     1 Minute Liters of Oxygen 0 L     2 Minute Oxygen Saturation % 96 %     2 Minute Liters of Oxygen 0 L     3 Minute Oxygen Saturation % 98 %     3 Minute Liters of Oxygen 0 L     4 Minute Oxygen Saturation % 98 %     4 Minute Liters of Oxygen 0 L     5 Minute Oxygen Saturation % 99 %     5 Minute Liters of Oxygen 0 L     6 Minute  Oxygen Saturation % 97 %     6 Minute Liters of Oxygen 0 L     2 Minute Post Oxygen Saturation % 98 %     2 Minute Post Liters of Oxygen 0 L             Oxygen Initial Assessment:  Oxygen Initial Assessment - 05/24/23 1238       Home Oxygen   Home Oxygen Device None    Sleep Oxygen Prescription CPAP    Home Exercise Oxygen Prescription None    Home Resting Oxygen Prescription None    Compliance with Home Oxygen Use No      Initial 6 min Walk   Oxygen Used None      Program Oxygen Prescription   Program Oxygen Prescription None      Intervention   Short Term Goals To learn and understand importance of monitoring SPO2 with pulse oximeter and demonstrate accurate use of the pulse oximeter.;To learn and understand importance of maintaining oxygen saturations>88%;To learn and demonstrate proper pursed lip breathing techniques or other breathing techniques. ;To learn and demonstrate proper use of respiratory medications    Long  Term Goals Verbalizes importance of monitoring SPO2 with pulse oximeter and return demonstration;Exhibits proper breathing techniques, such as pursed lip breathing or other method taught during program session;Maintenance of O2 saturations>88%;Compliance with respiratory medication             Oxygen Re-Evaluation:   Oxygen Discharge (Final Oxygen Re-Evaluation):   Initial Exercise Prescription:  Initial  Exercise Prescription - 05/24/23 1200       Date of Initial Exercise RX and Referring Provider   Date 05/24/23    Referring Provider Dr. Marca Ancona      Oxygen   Maintain Oxygen Saturation 88% or higher      Recumbant Bike   Level 1    RPM 50    Watts 15    Minutes 15    METs 2.19      NuStep   Level 1    SPM 80    Minutes 15    METs 2.19      REL-XR   Level 1    Speed 50    Minutes 15    METs 2.19      Biostep-RELP   Level 1    SPM 50    Minutes 15    METs 2.19      Track   Laps 22    Minutes 15    METs 2.2      Prescription Details   Frequency (times per week) 2    Duration Progress to 30 minutes of continuous aerobic without signs/symptoms of physical distress      Intensity   THRR 40-80% of Max Heartrate 98-128    Ratings of Perceived Exertion 11-13    Perceived Dyspnea 0-4      Progression   Progression Continue to progress workloads to maintain intensity without signs/symptoms of physical distress.      Resistance Training   Training Prescription Yes    Weight 2    Reps 10-15             Perform Capillary Blood Glucose checks as needed.  Exercise Prescription Changes:   Exercise Prescription Changes     Row Name 05/24/23 1200             Response to Exercise   Blood Pressure (Admit) 138/82       Blood Pressure (  Exercise) 142/80       Blood Pressure (Exit) 130/60       Heart Rate (Admit) 64 bpm       Heart Rate (Exercise) 109 bpm       Heart Rate (Exit) 65 bpm       Oxygen Saturation (Admit) 98 %       Oxygen Saturation (Exercise) 96 %       Oxygen Saturation (Exit) 98 %       Rating of Perceived Exertion (Exercise) 11       Perceived Dyspnea (Exercise) 0       Symptoms back pain 1/10 relieved with rest       Comments 6 MWT results                Exercise Comments:   Exercise Goals and Review:   Exercise Goals     Row Name 05/24/23 1231             Exercise Goals   Increase Physical Activity Yes        Intervention Provide advice, education, support and counseling about physical activity/exercise needs.;Develop an individualized exercise prescription for aerobic and resistive training based on initial evaluation findings, risk stratification, comorbidities and participant's personal goals.       Expected Outcomes Short Term: Attend rehab on a regular basis to increase amount of physical activity.;Long Term: Add in home exercise to make exercise part of routine and to increase amount of physical activity.;Long Term: Exercising regularly at least 3-5 days a week.       Increase Strength and Stamina Yes       Intervention Provide advice, education, support and counseling about physical activity/exercise needs.;Develop an individualized exercise prescription for aerobic and resistive training based on initial evaluation findings, risk stratification, comorbidities and participant's personal goals.       Expected Outcomes Short Term: Increase workloads from initial exercise prescription for resistance, speed, and METs.;Short Term: Perform resistance training exercises routinely during rehab and add in resistance training at home;Long Term: Improve cardiorespiratory fitness, muscular endurance and strength as measured by increased METs and functional capacity ( )       Able to understand and use rate of perceived exertion (RPE) scale Yes       Intervention Provide education and explanation on how to use RPE scale       Expected Outcomes Short Term: Able to use RPE daily in rehab to express subjective intensity level;Long Term:  Able to use RPE to guide intensity level when exercising independently       Able to understand and use Dyspnea scale Yes       Intervention Provide education and explanation on how to use Dyspnea scale       Expected Outcomes Short Term: Able to use Dyspnea scale daily in rehab to express subjective sense of shortness of breath during exertion;Long Term: Able to use Dyspnea scale  to guide intensity level when exercising independently       Knowledge and understanding of Target Heart Rate Range (THRR) Yes       Intervention Provide education and explanation of THRR including how the numbers were predicted and where they are located for reference       Expected Outcomes Short Term: Able to state/look up THRR;Long Term: Able to use THRR to govern intensity when exercising independently;Short Term: Able to use daily as guideline for intensity in rehab       Able to check pulse  independently Yes       Intervention Provide education and demonstration on how to check pulse in carotid and radial arteries.;Review the importance of being able to check your own pulse for safety during independent exercise       Expected Outcomes Short Term: Able to explain why pulse checking is important during independent exercise;Long Term: Able to check pulse independently and accurately       Understanding of Exercise Prescription Yes       Intervention Provide education, explanation, and written materials on patient's individual exercise prescription       Expected Outcomes Short Term: Able to explain program exercise prescription;Long Term: Able to explain home exercise prescription to exercise independently                Exercise Goals Re-Evaluation :   Discharge Exercise Prescription (Final Exercise Prescription Changes):  Exercise Prescription Changes - 05/24/23 1200       Response to Exercise   Blood Pressure (Admit) 138/82    Blood Pressure (Exercise) 142/80    Blood Pressure (Exit) 130/60    Heart Rate (Admit) 64 bpm    Heart Rate (Exercise) 109 bpm    Heart Rate (Exit) 65 bpm    Oxygen Saturation (Admit) 98 %    Oxygen Saturation (Exercise) 96 %    Oxygen Saturation (Exit) 98 %    Rating of Perceived Exertion (Exercise) 11    Perceived Dyspnea (Exercise) 0    Symptoms back pain 1/10 relieved with rest    Comments 6 MWT results             Nutrition:  Target  Goals: Understanding of nutrition guidelines, daily intake of sodium 1500mg , cholesterol 200mg , calories 30% from fat and 7% or less from saturated fats, daily to have 5 or more servings of fruits and vegetables.  Education: All About Nutrition: -Group instruction provided by verbal, written material, interactive activities, discussions, models, and posters to present general guidelines for heart healthy nutrition including fat, fiber, MyPlate, the role of sodium in heart healthy nutrition, utilization of the nutrition label, and utilization of this knowledge for meal planning. Follow up email sent as well. Written material given at graduation. Flowsheet Row Pulmonary Rehab from 12/03/2020 in [facility] Cardiac and Pulmonary Rehab  Date 10/15/20  Educator [facility]  Instruction Review Code 1- Verbalizes Understanding       Biometrics:  Pre Biometrics - 05/24/23 1231       Pre Biometrics   Height 5\' 7"  (1.702 m)    Weight 160 lb 8 oz (72.8 kg)    Waist Circumference 37.5 inches    Hip Circumference 40 inches    Waist to Hip Ratio 0.94 %    BMI (Calculated) 25.13    Single Leg Stand 0 seconds   attempted but was not able to stand on one foot.             Nutrition Therapy Plan and Nutrition Goals:  Nutrition Therapy & Goals - 05/24/23 1233       Nutrition Therapy   RD appointment deferred Yes             Nutrition Assessments:  MEDIFICTS Score Key: >=70 Need to make dietary changes  40-70 Heart Healthy Diet <= 40 Therapeutic Level Cholesterol Diet  Flowsheet Row Pulmonary Rehab from 05/24/2023 in [facility] Cardiac and Pulmonary Rehab  Picture Your Plate Total Score on Admission 61      Picture Your Plate Scores: <66 Unhealthy dietary  pattern with much room for improvement. 41-50 Dietary pattern unlikely to meet recommendations for good health and room for improvement. 51-60 More healthful dietary pattern, with some room for improvement.  >60 Healthy dietary pattern, although  there may be some specific behaviors that could be improved.   Nutrition Goals Re-Evaluation:   Nutrition Goals Discharge (Final Nutrition Goals Re-Evaluation):   Psychosocial: Target Goals: Acknowledge presence or absence of significant depression and/or stress, maximize coping skills, provide positive support system. Participant is able to verbalize types and ability to use techniques and skills needed for reducing stress and depression.   Education: Stress, Anxiety, and Depression - Group verbal and visual presentation to define topics covered.  Reviews how body is impacted by stress, anxiety, and depression.  Also discusses healthy ways to reduce stress and to treat/manage anxiety and depression.  Written material given at graduation. Flowsheet Row Pulmonary Rehab from 12/03/2020 in [facility] Cardiac and Pulmonary Rehab  Date 11/12/20  Educator [facility]  Instruction Review Code 1- [employer] Understanding       Education: Sleep Hygiene -Provides group verbal and written instruction about how sleep can affect your health.  Define sleep hygiene, discuss sleep cycles and impact of sleep habits. Review good sleep hygiene tips.    Initial Review & Psychosocial Screening:  Initial Psych Review & Screening - 05/23/23 1307       Initial Review   Current issues with Current Sleep Concerns;Current Depression;History of Depression;Current Stress Concerns      Family Dynamics   Good Support System? Yes      Barriers   Psychosocial barriers to participate in program The patient should benefit from training in stress management and relaxation.      Screening Interventions   Interventions Encouraged to exercise;Provide feedback about the scores to participant;To provide support and resources with identified psychosocial needs    Expected Outcomes Short Term goal: Utilizing psychosocial counselor, staff and physician to assist with identification of specific Stressors or current issues interfering  with healing process. Setting desired goal for each stressor or current issue identified.;Long Term Goal: Stressors or current issues are controlled or eliminated.;Short Term goal: Identification and review with participant of any Quality of Life or Depression concerns found by scoring the questionnaire.;Long Term goal: The participant improves quality of Life and PHQ9 Scores as seen by post scores and/or verbalization of changes             Quality of Life Scores:  Scores of 19 and below usually indicate a poorer quality of life in these areas.  A difference of  2-3 points is a clinically meaningful difference.  A difference of 2-3 points in the total score of the Quality of Life Index has been associated with significant improvement in overall quality of life, self-image, physical symptoms, and general health in studies assessing change in quality of life.  PHQ-9: Review Flowsheet  More data exists      05/24/2023 05/15/2023 12/01/2020 11/17/2020 11/03/2020  Depression screen PHQ 2/9  Decreased Interest 3 0 0 1 0  Down, Depressed, Hopeless 2 1 0 0 0  PHQ - 2 Score 5 1 0 1 0  Altered sleeping 3 - 0 1 0  Tired, decreased energy 2 - 1 1 1   Change in appetite 0 - 0 0 0  Feeling bad or failure about yourself  1 - 0 0 0  Trouble concentrating 0 - 0 0 0  Moving slowly or fidgety/restless 0 - 0 0 0  Suicidal thoughts  0 - 0 0 0  PHQ-9 Score 11 - 1 3 1   Difficult doing work/chores Somewhat difficult - Not difficult at all Not difficult at all Not difficult at all   Interpretation of Total Score  Total Score Depression Severity:  1-4 = Minimal depression, 5-9 = Mild depression, 10-14 = Moderate depression, 15-19 = Moderately severe depression, 20-27 = Severe depression   Psychosocial Evaluation and Intervention:  Psychosocial Evaluation - 05/23/23 1314       Psychosocial Evaluation & Interventions   Interventions Encouraged to exercise with the program and follow exercise  prescription;Stress management education;Relaxation education    Comments Brendalynn it coming to pulmonary rehab wtih pulmonary htn. She has done the program before and knows what to expect. She states she really doesn't want to come, but knows she needs to take care of herself so she is going to try. She is still recovering from losing her husband September of last year and the financial and emotional stress she was left with. She has been seeing a therapist which has helped her a lot so she has someone she can "vent to." Aneshia states she has been sleeping well with her medicine. She feels like she is ready to move forward in managing her health.    Expected Outcomes Short: attend pulmonary rehab for education and exercise. Long: develop positive self care habits.    Continue Psychosocial Services  Follow up required by staff             Psychosocial Re-Evaluation:   Psychosocial Discharge (Final Psychosocial Re-Evaluation):   Education: Education Goals: Education classes will be provided on a weekly basis, covering required topics. Participant will state understanding/return demonstration of topics presented.  Learning Barriers/Preferences:  Learning Barriers/Preferences - 05/23/23 1307       Learning Barriers/Preferences   Learning Barriers None    Learning Preferences None             General Pulmonary Education Topics:  Infection Prevention: - Provides verbal and written material to individual with discussion of infection control including proper hand washing and proper equipment cleaning during exercise session. Flowsheet Row Pulmonary Rehab from 05/24/2023 in [facility] Cardiac and Pulmonary Rehab  Date 05/24/23  Educator [facility]  Instruction Review Code 1- Verbalizes Understanding       Falls Prevention: - Provides verbal and written material to individual with discussion of falls prevention and safety. Flowsheet Row Pulmonary Rehab from 05/24/2023 in [facility] Cardiac and  Pulmonary Rehab  Date 05/24/23  Educator [facility]  Instruction Review Code 1- Verbalizes Understanding       Chronic Lung Disease Review: - Group verbal instruction with posters, models, PowerPoint presentations and videos,  to review new updates, new respiratory medications, new advancements in procedures and treatments. Providing information on websites and "800" numbers for continued self-education. Includes information about supplement oxygen, available portable oxygen systems, continuous and intermittent flow rates, oxygen safety, concentrators, and Medicare reimbursement for oxygen. Explanation of Pulmonary Drugs, including class, frequency, complications, importance of spacers, rinsing mouth after steroid MDI's, and proper cleaning methods for nebulizers. Review of basic lung anatomy and physiology related to function, structure, and complications of lung disease. Review of risk factors. Discussion about methods for diagnosing sleep apnea and types of masks and machines for OSA. Includes a review of the use of types of environmental controls: home humidity, furnaces, filters, dust mite/pet prevention, HEPA vacuums. Discussion about weather changes, air quality and the benefits of nasal washing. Instruction on Warning signs, infection symptoms,  calling MD promptly, preventive modes, and value of vaccinations. Review of effective airway clearance, coughing and/or vibration techniques. Emphasizing that all should Create an Action Plan. Written material given at graduation. Flowsheet Row Pulmonary Rehab from 05/24/2023 in [facility] Cardiac and Pulmonary Rehab  Education need identified 05/24/23       AED/CPR: - Group verbal and written instruction with the use of models to demonstrate the basic use of the AED with the basic ABC's of resuscitation.    Anatomy and Cardiac Procedures: - Group verbal and visual presentation and models provide information about basic cardiac anatomy and function. Reviews  the testing methods done to diagnose heart disease and the outcomes of the test results. Describes the treatment choices: Medical Management, Angioplasty, or Coronary Bypass Surgery for treating various heart conditions including Myocardial Infarction, Angina, Valve Disease, and Cardiac Arrhythmias.  Written material given at graduation. Flowsheet Row Pulmonary Rehab from 12/03/2020 in [facility] Cardiac and Pulmonary Rehab  Date 12/03/20  Educator [facility]  Instruction Review Code 1- Verbalizes Understanding       Medication Safety: - Group verbal and visual instruction to review commonly prescribed medications for heart and lung disease. Reviews the medication, class of the drug, and side effects. Includes the steps to properly store meds and maintain the prescription regimen.  Written material given at graduation. Flowsheet Row Pulmonary Rehab from 12/03/2020 in [facility] Cardiac and Pulmonary Rehab  Date 10/22/20  Educator [facility]  Instruction Review Code 1- Verbalizes Understanding       Other: -Provides group and verbal instruction on various topics (see comments)   Knowledge Questionnaire Score:  Knowledge Questionnaire Score - 05/24/23 1237       Knowledge Questionnaire Score   Pre Score 9/18              Core Components/Risk Factors/Patient Goals at Admission:  Personal Goals and Risk Factors at Admission - 05/24/23 1237       Core Components/Risk Factors/Patient Goals on Admission    Weight Management Yes;Weight Maintenance    Intervention Weight Management: Develop a combined nutrition and exercise program designed to reach desired caloric intake, while maintaining appropriate intake of nutrient and fiber, sodium and fats, and appropriate energy expenditure required for the weight goal.;Weight Management: Provide education and appropriate resources to help participant work on and attain dietary goals.;Weight Management/Obesity: Establish reasonable short term and long term weight goals.     Admit Weight 160 lb 8 oz (72.8 kg)    Goal Weight: Short Term 160 lb (72.6 kg)    Goal Weight: Long Term 160 lb (72.6 kg)    Expected Outcomes Short Term: Continue to assess and modify interventions until short term weight is achieved;Long Term: Adherence to nutrition and physical activity/exercise program aimed toward attainment of established weight goal;Weight Maintenance: Understanding of the daily nutrition guidelines, which includes 25-35% calories from fat, 7% or less cal from saturated fats, less than 200mg  cholesterol, less than 1.5gm of sodium, & 5 or more servings of fruits and vegetables daily;Understanding recommendations for meals to include 15-35% energy as protein, 25-35% energy from fat, 35-60% energy from carbohydrates, less than 200mg  of dietary cholesterol, 20-35 gm of total fiber daily;Understanding of distribution of calorie intake throughout the day with the consumption of 4-5 meals/snacks    Improve shortness of breath with ADL's Yes    Intervention Provide education, individualized exercise plan and daily activity instruction to help decrease symptoms of SOB with activities of daily living.    Expected Outcomes Short Term:  Improve cardiorespiratory fitness to achieve a reduction of symptoms when performing ADLs;Long Term: Be able to perform more ADLs without symptoms or delay the onset of symptoms    Diabetes Yes    Intervention Provide education about signs/symptoms and action to take for hypo/hyperglycemia.;Provide education about proper nutrition, including hydration, and aerobic/resistive exercise prescription along with prescribed medications to achieve blood glucose in normal ranges: Fasting glucose 65-99 mg/dL    Expected Outcomes Short Term: Participant verbalizes understanding of the signs/symptoms and immediate care of hyper/hypoglycemia, proper foot care and importance of medication, aerobic/resistive exercise and nutrition plan for blood glucose control.;Long Term:  Attainment of HbA1C < 7%.    Heart Failure Yes    Intervention Provide a combined exercise and nutrition program that is supplemented with education, support and counseling about heart failure. Directed toward relieving symptoms such as shortness of breath, decreased exercise tolerance, and extremity edema.    Expected Outcomes Improve functional capacity of life;Short term: Attendance in program 2-3 days a week with increased exercise capacity. Reported lower sodium intake. Reported increased fruit and vegetable intake. Reports medication compliance.;Short term: Daily weights obtained and reported for increase. Utilizing diuretic protocols set by physician.;Long term: Adoption of self-care skills and reduction of barriers for early signs and symptoms recognition and intervention leading to self-care maintenance.    Hypertension Yes    Intervention Provide education on lifestyle modifcations including regular physical activity/exercise, weight management, moderate sodium restriction and increased consumption of fresh fruit, vegetables, and low fat dairy, alcohol moderation, and smoking cessation.;Monitor prescription use compliance.    Expected Outcomes Short Term: Continued assessment and intervention until BP is < 140/54mm HG in hypertensive participants. < 130/11mm HG in hypertensive participants with diabetes, heart failure or chronic kidney disease.;Long Term: Maintenance of blood pressure at goal levels.    Lipids Yes    Intervention Provide education and support for participant on nutrition & aerobic/resistive exercise along with prescribed medications to achieve LDL 70mg , HDL >40mg .    Expected Outcomes Short Term: Participant states understanding of desired cholesterol values and is compliant with medications prescribed. Participant is following exercise prescription and nutrition guidelines.;Long Term: Cholesterol controlled with medications as prescribed, with individualized exercise RX and with  personalized nutrition plan. Value goals: LDL < 70mg , HDL > 40 mg.             Education:Diabetes - Individual verbal and written instruction to review signs/symptoms of diabetes, desired ranges of glucose level fasting, after meals and with exercise. Acknowledge that pre and post exercise glucose checks will be done for 3 sessions at entry of program. Flowsheet Row Pulmonary Rehab from 05/24/2023 in [facility] Cardiac and Pulmonary Rehab  Date 05/24/23  Educator [facility]  Instruction Review Code 1- Verbalizes Understanding       Know Your Numbers and Heart Failure: - Group verbal and visual instruction to discuss disease risk factors for cardiac and pulmonary disease and treatment options.  Reviews associated critical values for Overweight/Obesity, Hypertension, Cholesterol, and Diabetes.  Discusses basics of heart failure: signs/symptoms and treatments.  Introduces Heart Failure Zone chart for action plan for heart failure.  Written material given at graduation. Flowsheet Row Pulmonary Rehab from 12/03/2020 in [facility] Cardiac and Pulmonary Rehab  Date 10/29/20  Educator [facility]  Instruction Review Code 1- Verbalizes Understanding       Core Components/Risk Factors/Patient Goals Review:    Core Components/Risk Factors/Patient Goals at Discharge (Final Review):    ITP Comments:  ITP Comments     Row  Name 05/24/23 1213           ITP Comments Completed and gym orientation. Initial ITP created and sent for review to Dr. Jinny Sanders, Medical Director.                Comments: initial ITP

## 2023-05-26 ENCOUNTER — Other Ambulatory Visit (HOSPITAL_COMMUNITY): Payer: Self-pay

## 2023-05-26 DIAGNOSIS — I5032 Chronic diastolic (congestive) heart failure: Secondary | ICD-10-CM

## 2023-05-26 MED ORDER — DAPAGLIFLOZIN PROPANEDIOL 10 MG PO TABS: 10.0000 mg | ORAL_TABLET | Freq: Every day | ORAL | 3 refills | Status: AC

## 2023-05-26 MED ORDER — AMLODIPINE BESYLATE 10 MG PO TABS: 10.0000 mg | ORAL_TABLET | Freq: Every day | ORAL | 3 refills | Status: AC

## 2023-06-01 ENCOUNTER — Telehealth: Payer: Self-pay | Admitting: Oncology

## 2023-06-01 NOTE — Telephone Encounter (Signed)
Patient left VM on answering service requesting an appointment with Dr. Cathie Hoops. Looks like patient has not been seen since 2022--please advise.

## 2023-06-05 DIAGNOSIS — G4733 Obstructive sleep apnea (adult) (pediatric): Secondary | ICD-10-CM | POA: Diagnosis not present

## 2023-06-13 ENCOUNTER — Other Ambulatory Visit: Payer: Self-pay | Admitting: Family

## 2023-06-13 DIAGNOSIS — F4321 Adjustment disorder with depressed mood: Secondary | ICD-10-CM | POA: Diagnosis not present

## 2023-06-14 ENCOUNTER — Encounter: Payer: Medicare PPO | Attending: Cardiology | Admitting: *Deleted

## 2023-06-14 DIAGNOSIS — I272 Pulmonary hypertension, unspecified: Secondary | ICD-10-CM | POA: Insufficient documentation

## 2023-06-14 LAB — GLUCOSE, CAPILLARY
Glucose-Capillary: 103 mg/dL — ABNORMAL HIGH (ref 70–99)
Glucose-Capillary: 124 mg/dL — ABNORMAL HIGH (ref 70–99)
Glucose-Capillary: 132 mg/dL — ABNORMAL HIGH (ref 70–99)

## 2023-06-14 MED ORDER — ESOMEPRAZOLE MAGNESIUM 40 MG PO CPDR: 40.0000 mg | DELAYED_RELEASE_CAPSULE | Freq: Every day | ORAL | 1 refills | Status: DC

## 2023-06-14 NOTE — Progress Notes (Signed)
 Daily Session Note  Patient Details  Name: Jamie Murray MRN: [id number] Date of Birth: 02-Jun-1946 Referring Provider:   Conrad Ports Pulmonary Rehab from 05/24/2023 in [facility] Cardiac and Pulmonary Rehab  Referring Provider Dr. Ezra Shuck       Encounter Date: 06/14/2023  Check In:  Session Check In - 06/14/23 1339       Check-In   Supervising physician immediately available to respond to emergencies See telemetry face sheet for immediately available ER MD    Location [facility]-Cardiac & Pulmonary Rehab    Staff Present Hoy Rodney, RN Roxan Sharps, RN, Charmayne Hint, BS, ACSM CEP, Exercise Physiologist;Susanne Bice, RN, BSN, CCRP    Virtual Visit No    Medication changes reported     No    Fall or balance concerns reported    No    Warm-up and Cool-down Performed on first and last piece of equipment    Resistance Training Performed Yes    VAD Patient? No    PAD/SET Patient? No      Pain Assessment   Currently in Pain? No/denies                Social History   Tobacco Use  Smoking Status Never  Smokeless Tobacco Never    Goals Met:  Independence with exercise equipment Using PLB without cueing & demonstrates good technique Exercise tolerated well No report of concerns or symptoms today Strength training completed today  Goals Unmet:  Not Applicable  Comments: First full day of exercise!  Patient was oriented to gym and equipment including functions, settings, policies, and procedures.  Patient's individual exercise prescription and treatment plan were reviewed.  All starting workloads were established based on the results of the 6 minute walk test done at initial orientation visit.  The plan for exercise progression was also introduced and progression will be customized based on patient's performance and goals.    Dr. Oneil Pinal is Medical Director for [facility] Cardiac Rehabilitation.  Dr. Fuad Aleskerov is Medical Director for [facility]  Pulmonary Rehabilitation.

## 2023-06-16 LAB — GLUCOSE, CAPILLARY
Glucose-Capillary: 162 mg/dL — ABNORMAL HIGH (ref 70–99)
Glucose-Capillary: 105 mg/dL — ABNORMAL HIGH (ref 70–99)

## 2023-06-19 ENCOUNTER — Encounter: Payer: Medicare PPO | Admitting: *Deleted

## 2023-06-19 DIAGNOSIS — I272 Pulmonary hypertension, unspecified: Secondary | ICD-10-CM

## 2023-06-19 LAB — GLUCOSE, CAPILLARY
Glucose-Capillary: 118 mg/dL — ABNORMAL HIGH (ref 70–99)
Glucose-Capillary: 85 mg/dL (ref 70–99)
Glucose-Capillary: 110 mg/dL — ABNORMAL HIGH (ref 70–99)

## 2023-06-19 NOTE — Progress Notes (Signed)
 Daily Session Note  Patient Details  Name: Jamie Murray MRN: [id number] Date of Birth: Nov 05, 1945 Referring Provider:   Flowsheet Row Pulmonary Rehab from 05/24/2023 in [facility] Cardiac and Pulmonary Rehab  Referring Provider Dr. Ezra Shuck       Encounter Date: 06/19/2023  Check In:  Session Check In - 06/19/23 1403       Check-In   Supervising physician immediately available to respond to emergencies See telemetry face sheet for immediately available ER MD    Location [facility]-Cardiac & Pulmonary Rehab    Staff Present Hoy Rodney, RN BSN;Joseph Rolinda, RCP,RRT,BSRT;Maxon [place], , Exercise Physiologist;Laureen Delores, BS, RRT, CPFT    Virtual Visit No    Medication changes reported     No    Fall or balance concerns reported    No    Warm-up and Cool-down Performed on first and last piece of equipment    Resistance Training Performed Yes    VAD Patient? No    PAD/SET Patient? No      Pain Assessment   Currently in Pain? No/denies                Social History   Tobacco Use  Smoking Status Never  Smokeless Tobacco Never    Goals Met:  Independence with exercise equipment Exercise tolerated well No report of concerns or symptoms today Strength training completed today  Goals Unmet:  Not Applicable  Comments: Pt able to follow exercise prescription today without complaint.  Will continue to monitor for progression.    Dr. Oneil Pinal is Medical Director for [facility] Cardiac Rehabilitation.  Dr. Fuad Aleskerov is Medical Director for [facility] Pulmonary Rehabilitation.

## 2023-06-20 ENCOUNTER — Other Ambulatory Visit (HOSPITAL_COMMUNITY): Payer: Self-pay

## 2023-06-20 DIAGNOSIS — I5032 Chronic diastolic (congestive) heart failure: Secondary | ICD-10-CM

## 2023-06-20 MED ORDER — POTASSIUM CHLORIDE CRYS ER 10 MEQ PO TBCR: 10.0000 meq | EXTENDED_RELEASE_TABLET | Freq: Every day | ORAL | 0 refills | Status: DC

## 2023-06-21 ENCOUNTER — Encounter: Payer: Self-pay | Admitting: *Deleted

## 2023-06-21 ENCOUNTER — Encounter: Payer: Medicare PPO | Admitting: *Deleted

## 2023-06-21 DIAGNOSIS — I272 Pulmonary hypertension, unspecified: Secondary | ICD-10-CM

## 2023-06-21 LAB — GLUCOSE, CAPILLARY
Glucose-Capillary: 85 mg/dL (ref 70–99)
Glucose-Capillary: 89 mg/dL (ref 70–99)

## 2023-06-21 NOTE — Progress Notes (Signed)
 Incomplete Session Note  Patient Details  Name: Jamie Murray MRN: [id number] Date of Birth: 13-Jul-1945 Referring Provider:   Flowsheet Row Pulmonary Rehab from 05/24/2023 in [facility] Cardiac and Pulmonary Rehab  Referring Provider Dr. Peder Bourdon       Jamie Murray did not complete her rehab session.  Her CBG was below the acceptable exercise range. After treatment, it still was too low to exercise. Patient was asymptomatic and given instructions to call her doctor for further guidance for future exercise sessions.

## 2023-06-21 NOTE — Progress Notes (Signed)
 Pulmonary Individual Treatment Plan  Patient Details  Name: Yuriah Arndorfer MRN: [id number] Date of Birth: 1946/03/30 Referring Provider:   Flowsheet Row Pulmonary Rehab from 05/24/2023 in [facility] Cardiac and Pulmonary Rehab  Referring Provider Dr. Peder Bourdon       Initial Encounter Date:  Flowsheet Row Pulmonary Rehab from 05/24/2023 in [facility] Cardiac and Pulmonary Rehab  Date 05/24/23       Visit Diagnosis: Pulmonary hypertension (HCC)  Patient's Home Medications on Admission:  Current Outpatient Medications:    ACCU-CHEK GUIDE test strip, 3 each by Other route 3 (three) times daily as needed for other (low blood sugar symptoms)., Disp: 100 each, Rfl: 6   ambrisentan  (LETAIRIS ) 10 MG tablet, TAKE 1 TABLET BY MOUTH EVERY DAY, Disp: 30 tablet, Rfl: 3   amLODipine  (NORVASC ) 10 MG tablet, Take 1 tablet (10 mg total) by mouth daily., Disp: 90 tablet, Rfl: 3   aspirin  81 MG tablet, Take 81 mg by mouth daily., Disp: , Rfl:    Cholecalciferol  (VITAMIN D3) 125 MCG (5000 UT) CAPS, Take 1 capsule by mouth daily., Disp: , Rfl:    clonazePAM  (KLONOPIN ) 1 MG tablet, TAKE 1 TABLET BY MOUTH UP TO TWICE DAILY AS NEEDED FOR ANXIETY, Disp: 60 tablet, Rfl: 2   dapagliflozin  propanediol (FARXIGA ) 10 MG TABS tablet, Take 1 tablet (10 mg total) by mouth daily before breakfast., Disp: 90 tablet, Rfl: 3   diphenoxylate -atropine  (LOMOTIL ) 2.5-0.025 MG tablet, Take 1 tablet by mouth 4 (four) times daily as needed for diarrhea or loose stools., Disp: 120 tablet, Rfl: 2   escitalopram  (LEXAPRO ) 10 MG tablet, TAKE 1 TABLET BY MOUTH EVERY MORNING, Disp: 90 tablet, Rfl: 1   esomeprazole  (NEXIUM ) 40 MG capsule, Take 1 capsule (40 mg total) by mouth daily., Disp: 90 capsule, Rfl: 1   Fe Fum-FA-B Cmp-C-Zn-Mg-Mn-Cu (HEMOCYTE PLUS) 106-1 MG CAPS, Take 1 capsule by mouth daily., Disp: 90 capsule, Rfl: 1   furosemide  (LASIX ) 20 MG tablet, Take 2 tablets (40 mg total) by mouth 2 (two) times daily., Disp: 90 tablet,  Rfl: 0   insulin  aspart (NOVOLOG ) 100 UNIT/ML injection, Inject 4 Units into the skin. Bs greater than 200, Disp: , Rfl:    Insulin  Degludec (TRESIBA [place]), Inject 14 Units into the skin at bedtime., Disp: , Rfl:    Insulin  Pen Needle (B-D UF III MINI PEN NEEDLES) 31G X 5 MM MISC, 1 Needle by Other route 2 (two) times daily., Disp: 100 each, Rfl: 5   levothyroxine  (SYNTHROID ) 75 MCG tablet, TAKE 1 TABLET BY MOUTH EVERY MORNING ON AN EMPTY STOMACH, Disp: 90 tablet, Rfl: 1   loratadine  (CLARITIN ) 10 MG tablet, Take 10 mg by mouth daily. , Disp: , Rfl:    meclizine  (ANTIVERT ) 25 MG tablet, Take 25 mg by mouth 3 (three) times daily as needed for dizziness. , Disp: , Rfl:    metoprolol  succinate (TOPROL -XL) 25 MG 24 hr tablet, Take 1 tablet (25 mg total) by mouth daily., Disp: 90 tablet, Rfl: 3   Multiple Vitamins-Minerals (MULTI ADULT GUMMIES) CHEW, Chew by mouth daily., Disp: , Rfl:    ORENITRAM  0.25 MG TBCR, Take by mouth., Disp: , Rfl:    ORENITRAM  1 MG TBCR, Take by mouth., Disp: , Rfl:    ORENITRAM  MONTH 3 0.125 & 0.25 &1 MG TEPK, Take 3 tablets by mouth 3 (three) times daily., Disp: , Rfl:    potassium chloride  (KLOR-CON  M) 10 MEQ tablet, Take 1 tablet (10 mEq total) by mouth daily., Disp:  30 tablet, Rfl: 0   rosuvastatin  (CRESTOR ) 10 MG tablet, Take 10 mg by mouth daily., Disp: , Rfl:    silver  sulfADIAZINE  (SILVADENE ) 1 % cream, Apply to affected area daily, Disp: 400 g, Rfl: 1   tadalafil , PAH, (ADCIRCA ) 20 MG tablet, Take 2 tablets (40 mg total) by mouth daily., Disp: 60 tablet, Rfl: 11   Treprostinil  Diolamine ER (ORENITRAM ) 0.125 MG TBCR, Take 1 tablet by mouth 3 (three) times daily., Disp: , Rfl:   Past Medical History: Past Medical History:  Diagnosis Date   Abnormal electrocardiogram 11/25/2013   Formatting of this note might be different from the original. Last Assessment & Plan: Resolved when went to the ER.  Will f/u with Dr. Meredeth Stallion Note: Unchanged Formatting of this note might be  different from the original. Note: Unchanged Formatting of this note might be different from the original.  Formatting of this note might be different from the original. Last Assessment & Plan: Resolved when we   Allergy    Anemia    Anxiety    Bilateral hand pain 07/25/2018   Cerebral infarction, unspecified (HCC) 01/26/2015   Chest pain 08/09/2016   Chest pain, rule out acute myocardial infarction 08/09/2016   Chronic diastolic heart failure (HCC)    COPD (chronic obstructive pulmonary disease) (HCC)    Depression    Diabetes (HCC)    Diarrhea, functional 01/25/2016   Edema of lower extremity 02/03/2020   Elevated troponin level 01/09/2020   Enlarged RV (right ventricle)    GERD (gastroesophageal reflux disease)    Hepatic steatosis    History of stroke 08/26/2022   Formatting of this note might be different from the original. Last Assessment & Plan:  Formatting of this note might be different from the original.  Continue ASA, statin     Hyperlipidemia    Hypertension    Hypokalemia 12/22/2015   Hypomagnesemia 03/22/2016   Insomnia    Macrocytosis 01/25/2016   Migraines    Nutmeg liver    Osteoporosis    Palpitations 11/25/2013   Formatting of this note might be different from the original. Note: Unchanged   Pulmonary hypertension (HCC)    [facility] spotted fever    Severe pulmonary arterial systolic hypertension (HCC)    Severe tricuspid regurgitation by prior echocardiogram    Sleep apnea    CPAP   Transient cerebral ischemia 2016   Type 2 diabetes mellitus with hyperglycemia (HCC) 02/14/2012   Formatting of this note might be different from the original. Last Assessment & Plan: Hgb A1C is 7.1%.  Don't make any changes now.  Will hopefully increase exercise when pulmonary hypertension is properly treated.  ? Pulmonary rehab Note: Unchanged Formatting of this note might be different from the original. Formatting of this note might be different from the original.   Formatting of this note    Vertigo    every 2-3 months    Tobacco Use: Social History   Tobacco Use  Smoking Status Never  Smokeless Tobacco Never    Labs: Review Flowsheet  More data exists      Latest Ref Rng & Units 07/03/2020 07/15/2022 10/20/2022 01/20/2023 02/13/2023  Labs for ITP Cardiac and Pulmonary Rehab  Cholestrol 100 - 199 mg/dL 161  096  045  409  -  LDL (calc) 0 - 99 mg/dL 55  70  75  50  -  HDL-C >39 mg/dL 43  42  48  48  -  Trlycerides 0 -  149 mg/dL 621  308  657  846  -  Hemoglobin A1c - - 6.3  8.2  - 6.2        Details       This result is from an external source.          Pulmonary Assessment Scores:  Pulmonary Assessment Scores     Row Name 05/24/23 1238         ADL UCSD   ADL Phase Entry     SOB Score total 5     Rest 0     Walk 1     Stairs 0     Bath 0     Dress 0     Shop 1       CAT Score   CAT Score 4       mMRC Score   mMRC Score 3              UCSD: Self-administered rating of dyspnea associated with activities of daily living (ADLs) 6-point scale (0 = "not at all" to 5 = "maximal or unable to do because of breathlessness")  Scoring Scores range from 0 to 120.  Minimally important difference is 5 units  CAT: CAT can identify the health impairment of COPD patients and is better correlated with disease progression.  CAT has a scoring range of zero to 40. The CAT score is classified into four groups of low (less than 10), medium (10 - 20), high (21-30) and very high (31-40) based on the impact level of disease on health status. A CAT score over 10 suggests significant symptoms.  A worsening CAT score could be explained by an exacerbation, poor medication adherence, poor inhaler technique, or progression of COPD or comorbid conditions.  CAT MCID is 2 points  mMRC: mMRC (Modified Medical Research Council) Dyspnea Scale is used to assess the degree of baseline functional disability in patients of respiratory disease due to  dyspnea. No minimal important difference is established. A decrease in score of 1 point or greater is considered a positive change.   Pulmonary Function Assessment:   Exercise Target Goals: Exercise Program Goal: Individual exercise prescription set using results from initial 6 min walk test and THRR while considering  patient's activity barriers and safety.   Exercise Prescription Goal: Initial exercise prescription builds to 30-45 minutes a day of aerobic activity, 2-3 days per week.  Home exercise guidelines will be given to patient during program as part of exercise prescription that the participant will acknowledge.  Education: Aerobic Exercise: - Group verbal and visual presentation on the components of exercise prescription. Introduces F.I.T.T principle from ACSM for exercise prescriptions.  Reviews F.I.T.T. principles of aerobic exercise including progression. Written material given at graduation. Flowsheet Row Pulmonary Rehab from 05/24/2023 in [facility] Cardiac and Pulmonary Rehab  Education need identified 05/24/23       Education: Resistance Exercise: - Group verbal and visual presentation on the components of exercise prescription. Introduces F.I.T.T principle from ACSM for exercise prescriptions  Reviews F.I.T.T. principles of resistance exercise including progression. Written material given at graduation. Flowsheet Row Pulmonary Rehab from 12/03/2020 in [facility] Cardiac and Pulmonary Rehab  Date 12/03/20  Educator [facility]  Instruction Review Code 1- Verbalizes Understanding        Education: Exercise & Equipment Safety: - Individual verbal instruction and demonstration of equipment use and safety with use of the equipment. Flowsheet Row Pulmonary Rehab from 05/24/2023 in [facility] Cardiac and Pulmonary Rehab  Date 05/24/23  Educator [employer]  Instruction Review Code 1- Verbalizes Understanding       Education: Exercise Physiology & General Exercise Guidelines: - Group verbal and  written instruction with models to review the exercise physiology of the cardiovascular system and associated critical values. Provides general exercise guidelines with specific guidelines to those with heart or lung disease.  Flowsheet Row Pulmonary Rehab from 05/24/2023 in [facility] Cardiac and Pulmonary Rehab  Education need identified 05/24/23       Education: Flexibility, Balance, Mind/Body Relaxation: - Group verbal and visual presentation with interactive activity on the components of exercise prescription. Introduces F.I.T.T principle from ACSM for exercise prescriptions. Reviews F.I.T.T. principles of flexibility and balance exercise training including progression. Also discusses the mind body connection.  Reviews various relaxation techniques to help reduce and manage stress (i.e. Deep breathing, progressive muscle relaxation, and visualization). Balance handout provided to take home. Written material given at graduation. Flowsheet Row Pulmonary Rehab from 12/03/2020 in [facility] Cardiac and Pulmonary Rehab  Date 10/08/20  Educator [facility]  Instruction Review Code 1- Verbalizes Understanding       Activity Barriers & Risk Stratification:  Activity Barriers & Cardiac Risk Stratification - 05/24/23 1227       Activity Barriers & Cardiac Risk Stratification   Activity Barriers Back Problems;Muscular Weakness;Assistive Device;Balance Concerns             6 Minute Walk:  6 Minute Walk     Row Name 05/24/23 1221         6 Minute Walk   Phase Initial     Distance 900 feet     Walk Time 6 minutes     # of Rest Breaks 0     MPH 1.7     METS 2.19     RPE 11     Perceived Dyspnea  0     VO2 Peak 7.65     Symptoms No     Resting HR 64 bpm     Resting BP 138/82     Resting Oxygen  Saturation  98 %     Exercise Oxygen  Saturation  during 6 min walk 96 %     Max Ex. HR 109 bpm     Max Ex. BP 142/80     2 Minute Post BP 130/60       Interval HR   1 Minute HR 80     2 Minute HR 93      3 Minute HR 100     4 Minute HR 103     5 Minute HR 106     6 Minute HR 109     2 Minute Post HR 65     Interval Heart Rate? Yes       Interval Oxygen    Interval Oxygen ? Yes     Baseline Oxygen  Saturation % 98 %     1 Minute Oxygen  Saturation % 96 %     1 Minute Liters of Oxygen  0 L     2 Minute Oxygen  Saturation % 96 %     2 Minute Liters of Oxygen  0 L     3 Minute Oxygen  Saturation % 98 %     3 Minute Liters of Oxygen  0 L     4 Minute Oxygen  Saturation % 98 %     4 Minute Liters of Oxygen  0 L     5 Minute Oxygen  Saturation % 99 %     5 Minute Liters of Oxygen  0 L  6 Minute Oxygen  Saturation % 97 %     6 Minute Liters of Oxygen  0 L     2 Minute Post Oxygen  Saturation % 98 %     2 Minute Post Liters of Oxygen  0 L             Oxygen  Initial Assessment:  Oxygen  Initial Assessment - 05/24/23 1238       Home Oxygen    Home Oxygen  Device None    Sleep Oxygen  Prescription CPAP    Home Exercise Oxygen  Prescription None    Home Resting Oxygen  Prescription None    Compliance with Home Oxygen  Use No      Initial 6 min Walk   Oxygen  Used None      Program Oxygen  Prescription   Program Oxygen  Prescription None      Intervention   Short Term Goals To learn and understand importance of monitoring SPO2 with pulse oximeter and demonstrate accurate use of the pulse oximeter.;To learn and understand importance of maintaining oxygen  saturations>88%;To learn and demonstrate proper pursed lip breathing techniques or other breathing techniques. ;To learn and demonstrate proper use of respiratory medications    Long  Term Goals Verbalizes importance of monitoring SPO2 with pulse oximeter and return demonstration;Exhibits proper breathing techniques, such as pursed lip breathing or other method taught during program session;Maintenance of O2 saturations>88%;Compliance with respiratory medication             Oxygen  Re-Evaluation:  Oxygen  Re-Evaluation     Row Name 06/14/23  1342             Goals/Expected Outcomes   Comments Reviewed PLB technique with pt.  Talked about how it works and it's importance in maintaining their exercise saturations.       Goals/Expected Outcomes Short: Become more profiecient at using PLB.   Long: Become independent at using PLB.                Oxygen  Discharge (Final Oxygen  Re-Evaluation):  Oxygen  Re-Evaluation - 06/14/23 1342       Goals/Expected Outcomes   Comments Reviewed PLB technique with pt.  Talked about how it works and it's importance in maintaining their exercise saturations.    Goals/Expected Outcomes Short: Become more profiecient at using PLB.   Long: Become independent at using PLB.             Initial Exercise Prescription:  Initial Exercise Prescription - 05/24/23 1200       Date of Initial Exercise RX and Referring Provider   Date 05/24/23    Referring Provider Dr. Peder Bourdon      Oxygen    Maintain Oxygen  Saturation 88% or higher      Recumbant Bike   Level 1    RPM 50    Watts 15    Minutes 15    METs 2.19      NuStep   Level 1    SPM 80    Minutes 15    METs 2.19      REL-XR   Level 1    Speed 50    Minutes 15    METs 2.19      Biostep-RELP   Level 1    SPM 50    Minutes 15    METs 2.19      Track   Laps 22    Minutes 15    METs 2.2      Prescription Details   Frequency (times per week) 2  Duration Progress to 30 minutes of continuous aerobic without signs/symptoms of physical distress      Intensity   THRR 40-80% of Max Heartrate 98-128    Ratings of Perceived Exertion 11-13    Perceived Dyspnea 0-4      Progression   Progression Continue to progress workloads to maintain intensity without signs/symptoms of physical distress.      Resistance Training   Training Prescription Yes    Weight 2    Reps 10-15             Perform Capillary Blood Glucose checks as needed.  Exercise Prescription Changes:   Exercise Prescription Changes      Row Name 05/24/23 1200             Response to Exercise   Blood Pressure (Admit) 138/82       Blood Pressure (Exercise) 142/80       Blood Pressure (Exit) 130/60       Heart Rate (Admit) 64 bpm       Heart Rate (Exercise) 109 bpm       Heart Rate (Exit) 65 bpm       Oxygen  Saturation (Admit) 98 %       Oxygen  Saturation (Exercise) 96 %       Oxygen  Saturation (Exit) 98 %       Rating of Perceived Exertion (Exercise) 11       Perceived Dyspnea (Exercise) 0       Symptoms back pain 1/10 relieved with rest       Comments 6 MWT results                Exercise Comments:   Exercise Comments     Row Name 06/14/23 1340           Exercise Comments First full day of exercise!  Patient was oriented to gym and equipment including functions, settings, policies, and procedures.  Patient's individual exercise prescription and treatment plan were reviewed.  All starting workloads were established based on the results of the 6 minute walk test done at initial orientation visit.  The plan for exercise progression was also introduced and progression will be customized based on patient's performance and goals.                Exercise Goals and Review:   Exercise Goals     Row Name 05/24/23 1231             Exercise Goals   Increase Physical Activity Yes       Intervention Provide advice, education, support and counseling about physical activity/exercise needs.;Develop an individualized exercise prescription for aerobic and resistive training based on initial evaluation findings, risk stratification, comorbidities and participant's personal goals.       Expected Outcomes Short Term: Attend rehab on a regular basis to increase amount of physical activity.;Long Term: Add in home exercise to make exercise part of routine and to increase amount of physical activity.;Long Term: Exercising regularly at least 3-5 days a week.       Increase Strength and Stamina Yes       Intervention  Provide advice, education, support and counseling about physical activity/exercise needs.;Develop an individualized exercise prescription for aerobic and resistive training based on initial evaluation findings, risk stratification, comorbidities and participant's personal goals.       Expected Outcomes Short Term: Increase workloads from initial exercise prescription for resistance, speed, and METs.;Short Term: Perform resistance training exercises routinely during  rehab and add in resistance training at home;Long Term: Improve cardiorespiratory fitness, muscular endurance and strength as measured by increased METs and functional capacity ( )       Able to understand and use rate of perceived exertion (RPE) scale Yes       Intervention Provide education and explanation on how to use RPE scale       Expected Outcomes Short Term: Able to use RPE daily in rehab to express subjective intensity level;Long Term:  Able to use RPE to guide intensity level when exercising independently       Able to understand and use Dyspnea scale Yes       Intervention Provide education and explanation on how to use Dyspnea scale       Expected Outcomes Short Term: Able to use Dyspnea scale daily in rehab to express subjective sense of shortness of breath during exertion;Long Term: Able to use Dyspnea scale to guide intensity level when exercising independently       Knowledge and understanding of Target Heart Rate Range (THRR) Yes       Intervention Provide education and explanation of THRR including how the numbers were predicted and where they are located for reference       Expected Outcomes Short Term: Able to state/look up THRR;Long Term: Able to use THRR to govern intensity when exercising independently;Short Term: Able to use daily as guideline for intensity in rehab       Able to check pulse independently Yes       Intervention Provide education and demonstration on how to check pulse in carotid and radial  arteries.;Review the importance of being able to check your own pulse for safety during independent exercise       Expected Outcomes Short Term: Able to explain why pulse checking is important during independent exercise;Long Term: Able to check pulse independently and accurately       Understanding of Exercise Prescription Yes       Intervention Provide education, explanation, and written materials on patient's individual exercise prescription       Expected Outcomes Short Term: Able to explain program exercise prescription;Long Term: Able to explain home exercise prescription to exercise independently                Exercise Goals Re-Evaluation :  Exercise Goals Re-Evaluation     Row Name 06/14/23 1341             Exercise Goal Re-Evaluation   Exercise Goals Review Increase Physical Activity;Able to understand and use rate of perceived exertion (RPE) scale;Knowledge and understanding of Target Heart Rate Range (THRR);Understanding of Exercise Prescription;Increase Strength and Stamina;Able to understand and use Dyspnea scale;Able to check pulse independently       Comments Reviewed RPE and dyspnea scale, THR and program prescription with pt today.  Pt voiced understanding and was given a copy of goals to take home.       Expected Outcomes Short: Use RPE daily to regulate intensity.  Long: Follow program prescription in THR.                Discharge Exercise Prescription (Final Exercise Prescription Changes):  Exercise Prescription Changes - 05/24/23 1200       Response to Exercise   Blood Pressure (Admit) 138/82    Blood Pressure (Exercise) 142/80    Blood Pressure (Exit) 130/60    Heart Rate (Admit) 64 bpm    Heart Rate (Exercise) 109 bpm    Heart  Rate (Exit) 65 bpm    Oxygen  Saturation (Admit) 98 %    Oxygen  Saturation (Exercise) 96 %    Oxygen  Saturation (Exit) 98 %    Rating of Perceived Exertion (Exercise) 11    Perceived Dyspnea (Exercise) 0    Symptoms back  pain 1/10 relieved with rest    Comments 6 MWT results             Nutrition:  Target Goals: Understanding of nutrition guidelines, daily intake of sodium 1500mg , cholesterol 200mg , calories 30% from fat and 7% or less from saturated fats, daily to have 5 or more servings of fruits and vegetables.  Education: All About Nutrition: -Group instruction provided by verbal, written material, interactive activities, discussions, models, and posters to present general guidelines for heart healthy nutrition including fat, fiber, MyPlate, the role of sodium in heart healthy nutrition, utilization of the nutrition label, and utilization of this knowledge for meal planning. Follow up email sent as well. Written material given at graduation. Flowsheet Row Pulmonary Rehab from 12/03/2020 in [facility] Cardiac and Pulmonary Rehab  Date 10/15/20  Educator [facility]  Instruction Review Code 1- Verbalizes Understanding       Biometrics:  Pre Biometrics - 05/24/23 1231       Pre Biometrics   Height 5\' 7"  (1.702 m)    Weight 160 lb 8 oz (72.8 kg)    Waist Circumference 37.5 inches    Hip Circumference 40 inches    Waist to Hip Ratio 0.94 %    BMI (Calculated) 25.13    Single Leg Stand 0 seconds   attempted but was not able to stand on one foot.             Nutrition Therapy Plan and Nutrition Goals:  Nutrition Therapy & Goals - 05/24/23 1233       Nutrition Therapy   RD appointment deferred Yes             Nutrition Assessments:  MEDIFICTS Score Key: >=70 Need to make dietary changes  40-70 Heart Healthy Diet <= 40 Therapeutic Level Cholesterol Diet  Flowsheet Row Pulmonary Rehab from 05/24/2023 in [facility] Cardiac and Pulmonary Rehab  Picture Your Plate Total Score on Admission 61      Picture Your Plate Scores: <45 Unhealthy dietary pattern with much room for improvement. 41-50 Dietary pattern unlikely to meet recommendations for good health and room for improvement. 51-60 More  healthful dietary pattern, with some room for improvement.  >60 Healthy dietary pattern, although there may be some specific behaviors that could be improved.   Nutrition Goals Re-Evaluation:   Nutrition Goals Discharge (Final Nutrition Goals Re-Evaluation):   Psychosocial: Target Goals: Acknowledge presence or absence of significant depression and/or stress, maximize coping skills, provide positive support system. Participant is able to verbalize types and ability to use techniques and skills needed for reducing stress and depression.   Education: Stress, Anxiety, and Depression - Group verbal and visual presentation to define topics covered.  Reviews how body is impacted by stress, anxiety, and depression.  Also discusses healthy ways to reduce stress and to treat/manage anxiety and depression.  Written material given at graduation. Flowsheet Row Pulmonary Rehab from 12/03/2020 in [facility] Cardiac and Pulmonary Rehab  Date 11/12/20  Educator [facility]  Instruction Review Code 1- [employer] Understanding       Education: Sleep Hygiene -Provides group verbal and written instruction about how sleep can affect your health.  Define sleep hygiene, discuss sleep cycles and impact of  sleep habits. Review good sleep hygiene tips.    Initial Review & Psychosocial Screening:  Initial Psych Review & Screening - 05/23/23 1307       Initial Review   Current issues with Current Sleep Concerns;Current Depression;History of Depression;Current Stress Concerns      Family Dynamics   Good Support System? Yes      Barriers   Psychosocial barriers to participate in program The patient should benefit from training in stress management and relaxation.      Screening Interventions   Interventions Encouraged to exercise;Provide feedback about the scores to participant;To provide support and resources with identified psychosocial needs    Expected Outcomes Short Term goal: Utilizing psychosocial counselor, staff  and physician to assist with identification of specific Stressors or current issues interfering with healing process. Setting desired goal for each stressor or current issue identified.;Long Term Goal: Stressors or current issues are controlled or eliminated.;Short Term goal: Identification and review with participant of any Quality of Life or Depression concerns found by scoring the questionnaire.;Long Term goal: The participant improves quality of Life and PHQ9 Scores as seen by post scores and/or verbalization of changes             Quality of Life Scores:  Scores of 19 and below usually indicate a poorer quality of life in these areas.  A difference of  2-3 points is a clinically meaningful difference.  A difference of 2-3 points in the total score of the Quality of Life Index has been associated with significant improvement in overall quality of life, self-image, physical symptoms, and general health in studies assessing change in quality of life.  PHQ-9: Review Flowsheet  More data exists      05/24/2023 05/15/2023 12/01/2020 11/17/2020 11/03/2020  Depression screen PHQ 2/9  Decreased Interest 3 0 0 1 0  Down, Depressed, Hopeless 2 1 0 0 0  PHQ - 2 Score 5 1 0 1 0  Altered sleeping 3 - 0 1 0  Tired, decreased energy 2 - 1 1 1   Change in appetite 0 - 0 0 0  Feeling bad or failure about yourself  1 - 0 0 0  Trouble concentrating 0 - 0 0 0  Moving slowly or fidgety/restless 0 - 0 0 0  Suicidal thoughts 0 - 0 0 0  PHQ-9 Score 11 - 1 3 1   Difficult doing work/chores Somewhat difficult - Not difficult at all Not difficult at all Not difficult at all   Interpretation of Total Score  Total Score Depression Severity:  1-4 = Minimal depression, 5-9 = Mild depression, 10-14 = Moderate depression, 15-19 = Moderately severe depression, 20-27 = Severe depression   Psychosocial Evaluation and Intervention:  Psychosocial Evaluation - 05/23/23 1314       Psychosocial Evaluation &  Interventions   Interventions Encouraged to exercise with the program and follow exercise prescription;Stress management education;Relaxation education    Comments Gabbi it coming to pulmonary rehab wtih pulmonary htn. She has done the program before and knows what to expect. She states she really doesn't want to come, but knows she needs to take care of herself so she is going to try. She is still recovering from losing her husband September of last year and the financial and emotional stress she was left with. She has been seeing a therapist which has helped her a lot so she has someone she can "vent to." Annabellee states she has been sleeping well with her medicine. She feels like  she is ready to move forward in managing her health.    Expected Outcomes Short: attend pulmonary rehab for education and exercise. Long: develop positive self care habits.    Continue Psychosocial Services  Follow up required by staff             Psychosocial Re-Evaluation:   Psychosocial Discharge (Final Psychosocial Re-Evaluation):   Education: Education Goals: Education classes will be provided on a weekly basis, covering required topics. Participant will state understanding/return demonstration of topics presented.  Learning Barriers/Preferences:  Learning Barriers/Preferences - 05/23/23 1307       Learning Barriers/Preferences   Learning Barriers None    Learning Preferences None             General Pulmonary Education Topics:  Infection Prevention: - Provides verbal and written material to individual with discussion of infection control including proper hand washing and proper equipment cleaning during exercise session. Flowsheet Row Pulmonary Rehab from 05/24/2023 in [facility] Cardiac and Pulmonary Rehab  Date 05/24/23  Educator [facility]  Instruction Review Code 1- Verbalizes Understanding       Falls Prevention: - Provides verbal and written material to individual with discussion of falls  prevention and safety. Flowsheet Row Pulmonary Rehab from 05/24/2023 in [facility] Cardiac and Pulmonary Rehab  Date 05/24/23  Educator [facility]  Instruction Review Code 1- Verbalizes Understanding       Chronic Lung Disease Review: - Group verbal instruction with posters, models, PowerPoint presentations and videos,  to review new updates, new respiratory medications, new advancements in procedures and treatments. Providing information on websites and "800" numbers for continued self-education. Includes information about supplement oxygen , available portable oxygen  systems, continuous and intermittent flow rates, oxygen  safety, concentrators, and Medicare reimbursement for oxygen . Explanation of Pulmonary Drugs, including class, frequency, complications, importance of spacers, rinsing mouth after steroid MDI's, and proper cleaning methods for nebulizers. Review of basic lung anatomy and physiology related to function, structure, and complications of lung disease. Review of risk factors. Discussion about methods for diagnosing sleep apnea and types of masks and machines for OSA. Includes a review of the use of types of environmental controls: home humidity, furnaces, filters, dust mite/pet prevention, HEPA vacuums. Discussion about weather changes, air quality and the benefits of nasal washing. Instruction on Warning signs, infection symptoms, calling MD promptly, preventive modes, and value of vaccinations. Review of effective airway clearance, coughing and/or vibration techniques. Emphasizing that all should Create an Action Plan. Written material given at graduation. Flowsheet Row Pulmonary Rehab from 05/24/2023 in [facility] Cardiac and Pulmonary Rehab  Education need identified 05/24/23       AED/CPR: - Group verbal and written instruction with the use of models to demonstrate the basic use of the AED with the basic ABC's of resuscitation.    Anatomy and Cardiac Procedures: - Group verbal and visual  presentation and models provide information about basic cardiac anatomy and function. Reviews the testing methods done to diagnose heart disease and the outcomes of the test results. Describes the treatment choices: Medical Management, Angioplasty, or Coronary Bypass Surgery for treating various heart conditions including Myocardial Infarction, Angina, Valve Disease, and Cardiac Arrhythmias.  Written material given at graduation. Flowsheet Row Pulmonary Rehab from 12/03/2020 in [facility] Cardiac and Pulmonary Rehab  Date 12/03/20  Educator [facility]  Instruction Review Code 1- Verbalizes Understanding       Medication Safety: - Group verbal and visual instruction to review commonly prescribed medications for heart and lung disease. Reviews the medication, class of the  drug, and side effects. Includes the steps to properly store meds and maintain the prescription regimen.  Written material given at graduation. Flowsheet Row Pulmonary Rehab from 12/03/2020 in [facility] Cardiac and Pulmonary Rehab  Date 10/22/20  Educator [facility]  Instruction Review Code 1- Verbalizes Understanding       Other: -Provides group and verbal instruction on various topics (see comments)   Knowledge Questionnaire Score:  Knowledge Questionnaire Score - 05/24/23 1237       Knowledge Questionnaire Score   Pre Score 9/18              Core Components/Risk Factors/Patient Goals at Admission:  Personal Goals and Risk Factors at Admission - 05/24/23 1237       Core Components/Risk Factors/Patient Goals on Admission    Weight Management Yes;Weight Maintenance    Intervention Weight Management: Develop a combined nutrition and exercise program designed to reach desired caloric intake, while maintaining appropriate intake of nutrient and fiber, sodium and fats, and appropriate energy expenditure required for the weight goal.;Weight Management: Provide education and appropriate resources to help participant work on and attain dietary  goals.;Weight Management/Obesity: Establish reasonable short term and long term weight goals.    Admit Weight 160 lb 8 oz (72.8 kg)    Goal Weight: Short Term 160 lb (72.6 kg)    Goal Weight: Long Term 160 lb (72.6 kg)    Expected Outcomes Short Term: Continue to assess and modify interventions until short term weight is achieved;Long Term: Adherence to nutrition and physical activity/exercise program aimed toward attainment of established weight goal;Weight Maintenance: Understanding of the daily nutrition guidelines, which includes 25-35% calories from fat, 7% or less cal from saturated fats, less than 200mg  cholesterol, less than 1.5gm of sodium, & 5 or more servings of fruits and vegetables daily;Understanding recommendations for meals to include 15-35% energy as protein, 25-35% energy from fat, 35-60% energy from carbohydrates, less than 200mg  of dietary cholesterol, 20-35 gm of total fiber daily;Understanding of distribution of calorie intake throughout the day with the consumption of 4-5 meals/snacks    Improve shortness of breath with ADL's Yes    Intervention Provide education, individualized exercise plan and daily activity instruction to help decrease symptoms of SOB with activities of daily living.    Expected Outcomes Short Term: Improve cardiorespiratory fitness to achieve a reduction of symptoms when performing ADLs;Long Term: Be able to perform more ADLs without symptoms or delay the onset of symptoms    Diabetes Yes    Intervention Provide education about signs/symptoms and action to take for hypo/hyperglycemia.;Provide education about proper nutrition, including hydration, and aerobic/resistive exercise prescription along with prescribed medications to achieve blood glucose in normal ranges: Fasting glucose 65-99 mg/dL    Expected Outcomes Short Term: Participant verbalizes understanding of the signs/symptoms and immediate care of hyper/hypoglycemia, proper foot care and importance of  medication, aerobic/resistive exercise and nutrition plan for blood glucose control.;Long Term: Attainment of HbA1C < 7%.    Heart Failure Yes    Intervention Provide a combined exercise and nutrition program that is supplemented with education, support and counseling about heart failure. Directed toward relieving symptoms such as shortness of breath, decreased exercise tolerance, and extremity edema.    Expected Outcomes Improve functional capacity of life;Short term: Attendance in program 2-3 days a week with increased exercise capacity. Reported lower sodium intake. Reported increased fruit and vegetable intake. Reports medication compliance.;Short term: Daily weights obtained and reported for increase. Utilizing diuretic protocols set by physician.;Long term: Adoption  of self-care skills and reduction of barriers for early signs and symptoms recognition and intervention leading to self-care maintenance.    Hypertension Yes    Intervention Provide education on lifestyle modifcations including regular physical activity/exercise, weight management, moderate sodium restriction and increased consumption of fresh fruit, vegetables, and low fat dairy, alcohol moderation, and smoking cessation.;Monitor prescription use compliance.    Expected Outcomes Short Term: Continued assessment and intervention until BP is < 140/76mm HG in hypertensive participants. < 130/74mm HG in hypertensive participants with diabetes, heart failure or chronic kidney disease.;Long Term: Maintenance of blood pressure at goal levels.    Lipids Yes    Intervention Provide education and support for participant on nutrition & aerobic/resistive exercise along with prescribed medications to achieve LDL 70mg , HDL >40mg .    Expected Outcomes Short Term: Participant states understanding of desired cholesterol values and is compliant with medications prescribed. Participant is following exercise prescription and nutrition guidelines.;Long Term:  Cholesterol controlled with medications as prescribed, with individualized exercise RX and with personalized nutrition plan. Value goals: LDL < 70mg , HDL > 40 mg.             Education:Diabetes - Individual verbal and written instruction to review signs/symptoms of diabetes, desired ranges of glucose level fasting, after meals and with exercise. Acknowledge that pre and post exercise glucose checks will be done for 3 sessions at entry of program. Flowsheet Row Pulmonary Rehab from 05/24/2023 in [facility] Cardiac and Pulmonary Rehab  Date 05/24/23  Educator [facility]  Instruction Review Code 1- Verbalizes Understanding       Know Your Numbers and Heart Failure: - Group verbal and visual instruction to discuss disease risk factors for cardiac and pulmonary disease and treatment options.  Reviews associated critical values for Overweight/Obesity, Hypertension, Cholesterol, and Diabetes.  Discusses basics of heart failure: signs/symptoms and treatments.  Introduces Heart Failure Zone chart for action plan for heart failure.  Written material given at graduation. Flowsheet Row Pulmonary Rehab from 12/03/2020 in [facility] Cardiac and Pulmonary Rehab  Date 10/29/20  Educator [facility]  Instruction Review Code 1- Verbalizes Understanding       Core Components/Risk Factors/Patient Goals Review:    Core Components/Risk Factors/Patient Goals at Discharge (Final Review):    ITP Comments:  ITP Comments     Row Name 05/24/23 1213 06/14/23 1340 06/21/23 1259       ITP Comments Completed and gym orientation. Initial ITP created and sent for review to Dr. Faud Aleskerov, Medical Director. First full day of exercise!  Patient was oriented to gym and equipment including functions, settings, policies, and procedures.  Patient's individual exercise prescription and treatment plan were reviewed.  All starting workloads were established based on the results of the 6 minute walk test done at initial orientation visit.   The plan for exercise progression was also introduced and progression will be customized based on patient's performance and goals. 30 Day review completed. Medical Director ITP review done, changes made as directed, and signed approval by Medical Director.    new to program              Comments:
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
## 2023-06-26 ENCOUNTER — Other Ambulatory Visit (HOSPITAL_COMMUNITY): Payer: Self-pay

## 2023-06-26 DIAGNOSIS — I272 Pulmonary hypertension, unspecified: Secondary | ICD-10-CM

## 2023-06-26 MED ORDER — TADALAFIL (PAH) 20 MG PO TABS: 40.0000 mg | ORAL_TABLET | Freq: Every day | ORAL | 11 refills | Status: DC

## 2023-06-29 ENCOUNTER — Ambulatory Visit
Admission: RE | Admit: 2023-06-29 | Discharge: 2023-06-29 | Disposition: A | Discharge status: Home / Self Care | Payer: Medicare PPO | Source: Ambulatory Visit | Attending: Oncology | Admitting: Oncology

## 2023-06-29 ENCOUNTER — Inpatient Hospital Stay: Payer: Medicare PPO

## 2023-06-29 ENCOUNTER — Inpatient Hospital Stay: Payer: Medicare PPO | Attending: Oncology | Admitting: Oncology

## 2023-06-29 ENCOUNTER — Encounter: Payer: Self-pay | Admitting: Oncology

## 2023-06-29 VITALS — BP 152/78 | HR 73 | Temp 97.5°F | Resp 18

## 2023-06-29 DIAGNOSIS — F32A Depression, unspecified: Secondary | ICD-10-CM | POA: Diagnosis not present

## 2023-06-29 DIAGNOSIS — M549 Dorsalgia, unspecified: Secondary | ICD-10-CM | POA: Diagnosis not present

## 2023-06-29 DIAGNOSIS — Z8249 Family history of ischemic heart disease and other diseases of the circulatory system: Secondary | ICD-10-CM | POA: Insufficient documentation

## 2023-06-29 DIAGNOSIS — Z833 Family history of diabetes mellitus: Secondary | ICD-10-CM | POA: Diagnosis not present

## 2023-06-29 DIAGNOSIS — Z79899 Other long term (current) drug therapy: Secondary | ICD-10-CM | POA: Diagnosis not present

## 2023-06-29 DIAGNOSIS — D472 Monoclonal gammopathy: Secondary | ICD-10-CM | POA: Diagnosis not present

## 2023-06-29 DIAGNOSIS — N1832 Chronic kidney disease, stage 3b: Secondary | ICD-10-CM | POA: Diagnosis not present

## 2023-06-29 DIAGNOSIS — I5032 Chronic diastolic (congestive) heart failure: Secondary | ICD-10-CM | POA: Insufficient documentation

## 2023-06-29 DIAGNOSIS — I130 Hypertensive heart and chronic kidney disease with heart failure and stage 1 through stage 4 chronic kidney disease, or unspecified chronic kidney disease: Secondary | ICD-10-CM | POA: Insufficient documentation

## 2023-06-29 DIAGNOSIS — D631 Anemia in chronic kidney disease: Secondary | ICD-10-CM | POA: Insufficient documentation

## 2023-06-29 DIAGNOSIS — Z8673 Personal history of transient ischemic attack (TIA), and cerebral infarction without residual deficits: Secondary | ICD-10-CM | POA: Insufficient documentation

## 2023-06-29 DIAGNOSIS — F419 Anxiety disorder, unspecified: Secondary | ICD-10-CM | POA: Insufficient documentation

## 2023-06-29 DIAGNOSIS — Z90710 Acquired absence of both cervix and uterus: Secondary | ICD-10-CM | POA: Insufficient documentation

## 2023-06-29 DIAGNOSIS — M810 Age-related osteoporosis without current pathological fracture: Secondary | ICD-10-CM

## 2023-06-29 DIAGNOSIS — M2550 Pain in unspecified joint: Secondary | ICD-10-CM | POA: Diagnosis not present

## 2023-06-29 DIAGNOSIS — Z823 Family history of stroke: Secondary | ICD-10-CM | POA: Diagnosis not present

## 2023-06-29 DIAGNOSIS — J449 Chronic obstructive pulmonary disease, unspecified: Secondary | ICD-10-CM | POA: Insufficient documentation

## 2023-06-29 DIAGNOSIS — E1122 Type 2 diabetes mellitus with diabetic chronic kidney disease: Secondary | ICD-10-CM | POA: Diagnosis not present

## 2023-06-29 DIAGNOSIS — Z800 Family history of malignant neoplasm of digestive organs: Secondary | ICD-10-CM | POA: Diagnosis not present

## 2023-06-29 DIAGNOSIS — E785 Hyperlipidemia, unspecified: Secondary | ICD-10-CM | POA: Insufficient documentation

## 2023-06-29 DIAGNOSIS — I272 Pulmonary hypertension, unspecified: Secondary | ICD-10-CM | POA: Insufficient documentation

## 2023-06-29 DIAGNOSIS — M47816 Spondylosis without myelopathy or radiculopathy, lumbar region: Secondary | ICD-10-CM | POA: Diagnosis not present

## 2023-06-29 LAB — COMPREHENSIVE METABOLIC PANEL
ALT: 12 U/L (ref 0–44)
AST: 18 U/L (ref 15–41)
Albumin: 4.1 g/dL (ref 3.5–5.0)
Alkaline Phosphatase: 64 U/L (ref 38–126)
Anion gap: 9 (ref 5–15)
BUN: 26 mg/dL — ABNORMAL HIGH (ref 8–23)
CO2: 23 mmol/L (ref 22–32)
Calcium: 9.1 mg/dL (ref 8.9–10.3)
Chloride: 108 mmol/L (ref 98–111)
Creatinine, Ser: 1.39 mg/dL — ABNORMAL HIGH (ref 0.44–1.00)
GFR, Estimated: 39 mL/min — ABNORMAL LOW (ref >60)
Glucose, Bld: 164 mg/dL — ABNORMAL HIGH (ref 70–99)
Potassium: 4 mmol/L (ref 3.5–5.1)
Sodium: 140 mmol/L (ref 135–145)
Total Bilirubin: 0.6 mg/dL (ref 0.0–1.2)
Total Protein: 7.7 g/dL (ref 6.5–8.1)

## 2023-06-29 LAB — CBC WITH DIFFERENTIAL/PLATELET
Abs Immature Granulocytes: 0 10*3/uL (ref 0.00–0.07)
Basophils Absolute: 0 10*3/uL (ref 0.0–0.1)
Basophils Relative: 0 %
Eosinophils Absolute: 0 10*3/uL (ref 0.0–0.5)
Eosinophils Relative: 1 %
HCT: 32.8 % — ABNORMAL LOW (ref 36.0–46.0)
Hemoglobin: 10.4 g/dL — ABNORMAL LOW (ref 12.0–15.0)
Immature Granulocytes: 0 %
Lymphocytes Relative: 27 %
Lymphs Abs: 1.1 10*3/uL (ref 0.7–4.0)
MCH: 27.9 pg (ref 26.0–34.0)
MCHC: 31.7 g/dL (ref 30.0–36.0)
MCV: 87.9 fL (ref 80.0–100.0)
Monocytes Absolute: 0.2 10*3/uL (ref 0.1–1.0)
Monocytes Relative: 4 %
Neutro Abs: 2.6 10*3/uL (ref 1.7–7.7)
Neutrophils Relative %: 68 %
Platelets: 151 10*3/uL (ref 150–400)
RBC: 3.73 MIL/uL — ABNORMAL LOW (ref 3.87–5.11)
RDW: 15.5 % (ref 11.5–15.5)
WBC: 3.9 10*3/uL — ABNORMAL LOW (ref 4.0–10.5)
nRBC: 0 % (ref 0.0–0.2)

## 2023-06-29 LAB — LACTATE DEHYDROGENASE: LDH: 114 U/L (ref 98–192)

## 2023-06-29 NOTE — Assessment & Plan Note (Signed)
Recent Labs were reviewed and discussed with patient. Lab Results  Component Value Date   HGB 10.4 (L) 06/29/2023   TIBC 287 02/26/2021   IRONPCTSAT 10 (L) 02/26/2021   FERRITIN 28 02/26/2021    Anemia is most likely due to CKD stage 3b Recommend patient to further increase iron store. Goal of  ferritin  200.  Plan IV iron with Venofer 200mg  weekly x 3 doses.

## 2023-06-29 NOTE — Addendum Note (Signed)
Addended by: Rickard Patience on: 06/29/2023 09:16 PM   Modules accepted: Level of Service

## 2023-06-29 NOTE — Assessment & Plan Note (Addendum)
IgM kappa MGUS- M protein 0.7, light chain ratio 3.46 I discussed with patient about the diagnosis of IgM MGUS which is an asymptomatic condition which has a small risk of progression to smoldering [place] macroglobulinemia and to symptomatic [place] macroglobulinemia, and less often to lymphoma or AL amyloidosis. Infrequently, IgM MGUS can progress to IgM multiple myeloma. check cbc cmp, myeloma panel, light chain ratio, beta 2 microglobulin, LDH, 24h UPEP.  Obtain DEXA

## 2023-06-29 NOTE — Patient Instructions (Signed)
Skeletal survey/ bone survey- this scan is a walk-in and no appointment is needed. You may go to the medical mall at your own convenience to have this scan done.

## 2023-06-29 NOTE — Progress Notes (Addendum)
Hematology/Oncology Progress note [phone] [phone]      Patient Care Team: Miki Kins, FNP as PCP - General (Family Medicine) Laurier Nancy, MD as Consulting Physician (Cardiology) Rickard Patience, MD as Consulting Physician (Oncology)   CHIEF COMPLAINTS/REASON FOR VISIT:  Follow up for anemia, MGUS  ASSESSMENT & PLAN:   MGUS (monoclonal gammopathy of unknown significance) IgM kappa MGUS- M protein 0.7, light chain ratio 3.46 I discussed with patient about the diagnosis of IgM MGUS which is an asymptomatic condition which has a small risk of progression to smoldering [place] macroglobulinemia and to symptomatic [place] macroglobulinemia, and less often to lymphoma or AL amyloidosis. Infrequently, IgM MGUS can progress to IgM multiple myeloma. check cbc cmp, myeloma panel, light chain ratio, beta 2 microglobulin, LDH, 24h UPEP.  Obtain DEXA    Anemia due to stage 3b chronic kidney disease (HCC) Recent Labs were reviewed and discussed with patient. Lab Results  Component Value Date   HGB 10.4 (L) 06/29/2023   TIBC 287 02/26/2021   IRONPCTSAT 10 (L) 02/26/2021   FERRITIN 28 02/26/2021    Anemia is most likely due to CKD stage 3b Recommend patient to further increase iron store. Goal of  ferritin  200.  Plan IV iron with Venofer 200mg  weekly x 3 doses.   Orders Placed This Encounter  Procedures   DG Bone Survey Met    Standing Status:   Future    Number of Occurrences:   1    Expected Date:   06/30/2023    Expiration Date:   06/28/2024    Reason for Exam (SYMPTOM  OR DIAGNOSIS REQUIRED):   osteoporosis    Preferred imaging location?:   [facility]   CBC with Differential/Platelet    Standing Status:   Future    Number of Occurrences:   1    Expected Date:   06/29/2023    Expiration Date:   06/28/2024   Multiple Myeloma Panel (SPEP&IFE w/QIG)    Standing Status:   Future    Number of Occurrences:   1    Expected Date:    06/29/2023    Expiration Date:   06/28/2024   Kappa/lambda light chains    Standing Status:   Future    Number of Occurrences:   1    Expected Date:   06/29/2023    Expiration Date:   06/28/2024   Lactate dehydrogenase    Standing Status:   Future    Number of Occurrences:   1    Expected Date:   06/29/2023    Expiration Date:   06/28/2024   Comprehensive metabolic panel    Standing Status:   Future    Number of Occurrences:   1    Expected Date:   06/29/2023    Expiration Date:   06/28/2024   IFE+PROTEIN ELECTRO, 24-HR UR    Standing Status:   Future    Expected Date:   06/29/2023    Expiration Date:   06/28/2024   Beta 2 microglobulin, serum    Standing Status:   Future    Number of Occurrences:   1    Expected Date:   06/29/2023    Expiration Date:   06/28/2024   We spent sufficient time to discuss many aspect of care, questions were answered to patient's satisfaction. A total of 40 minutes was spent on this visit.  With 10 minutes spent reviewing lab results, 25 minutes counseling the patient on the diagnosis, prognosis.  Additional 5 minutes was spent on answering patient and daughter's  questions.  All questions were answered. The patient knows to call the clinic with any problems, questions or concerns.  Rickard Patience, MD, PhD [facility] Hematology Oncology 06/29/2023   HISTORY OF PRESENTING ILLNESS:   Jamie Murray is a  78 y.o.  female with PMH listed below was seen in consultation at the request of  Miki Kins, FNP  for evaluation of anemia   11/11/2020 cbc showed hemoglobin 9.3, mcv 85.4 Wbc 3.3, platelet count 132 Anemia is chronic, since at least 2017 Patient has chronic kidney disease following nephrology.  She report feeling fatigued. No bloody or black stool, no abdominal pain, unintentional weight loss, fever, night sweats.  She takes Hemocyte plus.    INTERVAL HISTORY Jamie Murray is a 78 y.o. female who has above history reviewed by me today  presents to re- establish care.  She was accompanied by her daughter Patient lost follow up after her visit in 2022.  She reports occasional back pain  Review of Systems  Constitutional:  Positive for fatigue. Negative for appetite change, chills and fever.  HENT:   Negative for hearing loss and voice change.   Eyes:  Negative for eye problems.  Respiratory:  Negative for chest tightness and cough.   Cardiovascular:  Negative for chest pain.  Gastrointestinal:  Negative for abdominal distention, abdominal pain and blood in stool.  Endocrine: Negative for hot flashes.  Genitourinary:  Negative for difficulty urinating and frequency.   Musculoskeletal:  Positive for arthralgias.  Skin:  Negative for itching and rash.  Neurological:  Negative for extremity weakness.  Hematological:  Negative for adenopathy.  Psychiatric/Behavioral:  Negative for confusion.     MEDICAL HISTORY:  Past Medical History:  Diagnosis Date   Abnormal electrocardiogram 11/25/2013   Formatting of this note might be different from the original. Last Assessment & Plan: Resolved when went to the ER.  Will f/u with Dr. Welton Flakes Note: Unchanged Formatting of this note might be different from the original. Note: Unchanged Formatting of this note might be different from the original.  Formatting of this note might be different from the original. Last Assessment & Plan: Resolved when we   Allergy    Anemia    Anxiety    Bilateral hand pain 07/25/2018   Cerebral infarction, unspecified (HCC) 01/26/2015   Chest pain 08/09/2016   Chest pain, rule out acute myocardial infarction 08/09/2016   Chronic diastolic heart failure (HCC)    COPD (chronic obstructive pulmonary disease) (HCC)    Depression    Diabetes (HCC)    Diarrhea, functional 01/25/2016   Edema of lower extremity 02/03/2020   Elevated troponin level 01/09/2020   Enlarged RV (right ventricle)    GERD (gastroesophageal reflux disease)    Hepatic steatosis     History of stroke 08/26/2022   Formatting of this note might be different from the original. Last Assessment & Plan:  Formatting of this note might be different from the original.  Continue ASA, statin     Hyperlipidemia    Hypertension    Hypokalemia 12/22/2015   Hypomagnesemia 03/22/2016   Insomnia    Macrocytosis 01/25/2016   Migraines    Nutmeg liver    Osteoporosis    Palpitations 11/25/2013   Formatting of this note might be different from the original. Note: Unchanged   Pulmonary hypertension (HCC)    [facility] spotted fever    Severe pulmonary arterial  systolic hypertension (HCC)    Severe tricuspid regurgitation by prior echocardiogram    Sleep apnea    CPAP   Transient cerebral ischemia 2016   Type 2 diabetes mellitus with hyperglycemia (HCC) 02/14/2012   Formatting of this note might be different from the original. Last Assessment & Plan: Hgb A1C is 7.1%.  Don't make any changes now.  Will hopefully increase exercise when pulmonary hypertension is properly treated.  ? Pulmonary rehab Note: Unchanged Formatting of this note might be different from the original. Formatting of this note might be different from the original.  Formatting of this note    Vertigo    every 2-3 months    SURGICAL HISTORY: Past Surgical History:  Procedure Laterality Date   ABDOMINAL HYSTERECTOMY     CORONARY ANGIOPLASTY     ESOPHAGOGASTRODUODENOSCOPY (EGD) WITH PROPOFOL N/A 08/24/2015   Procedure: ESOPHAGOGASTRODUODENOSCOPY (EGD) WITH PROPOFOL with dialation;  Surgeon: Midge Minium, MD;  Location: [facility];  Service: Endoscopy;  Laterality: N/A;  Diabetic - oral meds   RIGHT/LEFT HEART CATH AND CORONARY/GRAFT ANGIOGRAPHY N/A 01/13/2020   Procedure: RIGHT/LEFT HEART CATH AND CORONARY/GRAFT ANGIOGRAPHY;  Surgeon: Iran Ouch, MD;  Location: [facility] INVASIVE CV LAB;  Service: Cardiovascular;  Laterality: N/A;   TUBAL LIGATION      SOCIAL HISTORY: Social History   Socioeconomic  History   Marital status: Widowed    Spouse name: Chanetta Marshall   Number of children: 3   Years of education: 14   Highest education level: Not on file  Occupational History    Comment: work part time, day care   Occupation: Retired  Tobacco Use   Smoking status: Never   Smokeless tobacco: Never  Vaping Use   Vaping status: Never Used  Substance and Sexual Activity   Alcohol use: No    Alcohol/week: 0.0 standard drinks of alcohol   Drug use: No   Sexual activity: Not Currently    Birth control/protection: None, Post-menopausal  Other Topics Concern   Not on file  Social History Narrative   Caffeine use- coffee 2 cups daily, Coke once a week, green tea daily   Social Drivers of Corporate investment banker Strain: Not on file  Food Insecurity: No Food Insecurity (05/15/2023)   Hunger Vital Sign    Worried About Running Out of Food in the Last Year: Never true    Ran Out of Food in the Last Year: Never true  Transportation Needs: No Transportation Needs (05/15/2023)   PRAPARE - Administrator, Civil Service (Medical): No    Lack of Transportation (Non-Medical): No  Physical Activity: Not on file  Stress: Not on file  Social Connections: Not on file  Intimate Partner Violence: Not At Risk (05/15/2023)   Humiliation, Afraid, Rape, and Kick questionnaire    Fear of Current or Ex-Partner: No    Emotionally Abused: No    Physically Abused: No    Sexually Abused: No    FAMILY HISTORY: Family History  Problem Relation Age of Onset   Diabetes Mother    Hypertension Mother    Stroke Mother    Heart disease Mother        MI   Cancer Father        pancreatic   Stroke Father    Hypertension Sister    Diabetes Brother    Diabetes Maternal Grandfather    Diabetes Sister    Diabetes Sister    Diabetes Brother    Diabetes  Brother     ALLERGIES:  is allergic to atorvastatin, fluoxetine, levemir [insulin detemir], and prozac [fluoxetine hcl].  MEDICATIONS:  Current  Outpatient Medications  Medication Sig Dispense Refill   ACCU-CHEK GUIDE test strip 3 each by Other route 3 (three) times daily as needed for other (low blood sugar symptoms). 100 each 6   ambrisentan (LETAIRIS) 10 MG tablet TAKE 1 TABLET BY MOUTH EVERY DAY 30 tablet 3   amLODipine (NORVASC) 10 MG tablet Take 1 tablet (10 mg total) by mouth daily. 90 tablet 3   aspirin 81 MG tablet Take 81 mg by mouth daily.     Cholecalciferol (VITAMIN D3) 125 MCG (5000 UT) CAPS Take 1 capsule by mouth daily.     clonazePAM (KLONOPIN) 1 MG tablet TAKE 1 TABLET BY MOUTH UP TO TWICE DAILY AS NEEDED FOR ANXIETY 60 tablet 2   dapagliflozin propanediol (FARXIGA) 10 MG TABS tablet Take 1 tablet (10 mg total) by mouth daily before breakfast. 90 tablet 3   diphenoxylate-atropine (LOMOTIL) 2.5-0.025 MG tablet Take 1 tablet by mouth 4 (four) times daily as needed for diarrhea or loose stools. 120 tablet 2   escitalopram (LEXAPRO) 10 MG tablet TAKE 1 TABLET BY MOUTH EVERY MORNING 90 tablet 1   esomeprazole (NEXIUM) 40 MG capsule Take 1 capsule (40 mg total) by mouth daily. 90 capsule 1   Fe Fum-FA-B Cmp-C-Zn-Mg-Mn-Cu (HEMOCYTE PLUS) 106-1 MG CAPS Take 1 capsule by mouth daily. 90 capsule 1   furosemide (LASIX) 20 MG tablet Take 2 tablets (40 mg total) by mouth 2 (two) times daily. 90 tablet 0   insulin aspart (NOVOLOG) 100 UNIT/ML injection Inject 4 Units into the skin. Bs greater than 200     Insulin Degludec (TRESIBA [place]) Inject 14 Units into the skin at bedtime.     Insulin Pen Needle (B-D UF III MINI PEN NEEDLES) 31G X 5 MM MISC 1 Needle by Other route 2 (two) times daily. 100 each 5   levothyroxine (SYNTHROID) 75 MCG tablet TAKE 1 TABLET BY MOUTH EVERY MORNING ON AN EMPTY STOMACH 90 tablet 1   loratadine (CLARITIN) 10 MG tablet Take 10 mg by mouth daily.      meclizine (ANTIVERT) 25 MG tablet Take 25 mg by mouth 3 (three) times daily as needed for dizziness.      metoprolol succinate (TOPROL-XL) 25 MG 24 hr tablet Take  1 tablet (25 mg total) by mouth daily. 90 tablet 3   Multiple Vitamins-Minerals (MULTI ADULT GUMMIES) CHEW Chew by mouth daily.     ORENITRAM 0.25 MG TBCR Take by mouth.     ORENITRAM 1 MG TBCR Take by mouth.     ORENITRAM MONTH 3 0.125 & 0.25 &1 MG TEPK Take 3 tablets by mouth 3 (three) times daily.     potassium chloride (KLOR-CON M) 10 MEQ tablet Take 1 tablet (10 mEq total) by mouth daily. 30 tablet 0   rosuvastatin (CRESTOR) 10 MG tablet Take 10 mg by mouth daily.     silver sulfADIAZINE (SILVADENE) 1 % cream Apply to affected area daily 400 g 1   tadalafil, PAH, (ADCIRCA) 20 MG tablet Take 2 tablets (40 mg total) by mouth daily. 60 tablet 11   Treprostinil Diolamine ER (ORENITRAM) 0.125 MG TBCR Take 1 tablet by mouth 3 (three) times daily.     No current facility-administered medications for this visit.     PHYSICAL EXAMINATION: ECOG PERFORMANCE STATUS: 1 - Symptomatic but completely ambulatory Vitals:   06/29/23 1407  BP: (!) 152/78  Pulse: 73  Resp: 18  Temp: (!) 97.5 F (36.4 C)   There were no vitals filed for this visit.   Physical Exam Constitutional:      General: She is not in acute distress. HENT:     Head: Normocephalic and atraumatic.  Eyes:     General: No scleral icterus. Cardiovascular:     Rate and Rhythm: Normal rate and regular rhythm.     Heart sounds: Normal heart sounds.  Pulmonary:     Effort: Pulmonary effort is normal. No respiratory distress.     Breath sounds: No wheezing.  Abdominal:     General: Bowel sounds are normal. There is no distension.     Palpations: Abdomen is soft.  Musculoskeletal:        General: No deformity. Normal range of motion.     Cervical back: Normal range of motion and neck supple.  Skin:    General: Skin is warm and dry.     Findings: No erythema or rash.  Neurological:     Mental Status: She is alert and oriented to person, place, and time. Mental status is at baseline.     Cranial Nerves: No cranial nerve  deficit.     Coordination: Coordination normal.  Psychiatric:        Mood and Affect: Mood normal.     LABORATORY DATA:  I have reviewed the data as listed Lab Results  Component Value Date   WBC 3.9 (L) 06/29/2023   HGB 10.4 (L) 06/29/2023   HCT 32.8 (L) 06/29/2023   MCV 87.9 06/29/2023   PLT 151 06/29/2023   Recent Labs    10/20/22 1207 01/18/23 1156 01/20/23 1136 04/26/23 1211 06/29/23 1514  NA 141 141 142 139 140  K 4.4 3.8 4.0 3.7 4.0  CL 104 110 105 106 108  CO2 21 24 22 24 23   GLUCOSE 129* 105* 75 117* 164*  BUN 22 15 18 16  26*  CREATININE 1.70* 1.49* 1.62* 1.43* 1.39*  CALCIUM 9.4 9.1 9.8 9.6 9.1  GFRNONAA  --  36*  --  38* 39*  PROT 6.7  --  6.5  --  7.7  ALBUMIN 4.1  --  4.2  --  4.1  AST 19  --  14  --  18  ALT 10  --  10  --  12  ALKPHOS 74  --  71  --  64  BILITOT 0.3  --  0.3  --  0.6   Iron/TIBC/Ferritin/ %Sat    Component Value Date/Time   IRON 28 02/26/2021 1313   IRON 38 07/14/2016 1140   TIBC 287 02/26/2021 1313   TIBC 302 07/14/2016 1140   FERRITIN 28 02/26/2021 1313   FERRITIN 32 07/14/2016 1140   IRONPCTSAT 10 (L) 02/26/2021 1313   IRONPCTSAT 13 (L) 07/14/2016 1140      RADIOGRAPHIC STUDIES: I have personally reviewed the radiological images as listed and agreed with the findings in the report. No results found.

## 2023-06-30 LAB — KAPPA/LAMBDA LIGHT CHAINS
Kappa free light chain: 46.8 mg/L — ABNORMAL HIGH (ref 3.3–19.4)
Kappa, lambda light chain ratio: 3.3 — ABNORMAL HIGH (ref 0.26–1.65)
Lambda free light chains: 14.2 mg/L (ref 5.7–26.3)

## 2023-06-30 LAB — BETA 2 MICROGLOBULIN, SERUM: Beta-2 Microglobulin: 4.2 mg/L — ABNORMAL HIGH (ref 0.6–2.4)

## 2023-07-03 ENCOUNTER — Encounter: Payer: Medicare PPO | Admitting: *Deleted

## 2023-07-03 ENCOUNTER — Other Ambulatory Visit: Payer: Self-pay

## 2023-07-03 DIAGNOSIS — I272 Pulmonary hypertension, unspecified: Secondary | ICD-10-CM | POA: Diagnosis not present

## 2023-07-03 DIAGNOSIS — I5032 Chronic diastolic (congestive) heart failure: Secondary | ICD-10-CM | POA: Diagnosis not present

## 2023-07-03 DIAGNOSIS — D472 Monoclonal gammopathy: Secondary | ICD-10-CM

## 2023-07-03 DIAGNOSIS — E1122 Type 2 diabetes mellitus with diabetic chronic kidney disease: Secondary | ICD-10-CM | POA: Diagnosis not present

## 2023-07-03 DIAGNOSIS — M549 Dorsalgia, unspecified: Secondary | ICD-10-CM | POA: Diagnosis not present

## 2023-07-03 DIAGNOSIS — I130 Hypertensive heart and chronic kidney disease with heart failure and stage 1 through stage 4 chronic kidney disease, or unspecified chronic kidney disease: Secondary | ICD-10-CM | POA: Diagnosis not present

## 2023-07-03 DIAGNOSIS — N1832 Chronic kidney disease, stage 3b: Secondary | ICD-10-CM | POA: Diagnosis not present

## 2023-07-03 DIAGNOSIS — J449 Chronic obstructive pulmonary disease, unspecified: Secondary | ICD-10-CM | POA: Diagnosis not present

## 2023-07-03 DIAGNOSIS — M2550 Pain in unspecified joint: Secondary | ICD-10-CM | POA: Diagnosis not present

## 2023-07-03 DIAGNOSIS — D631 Anemia in chronic kidney disease: Secondary | ICD-10-CM | POA: Diagnosis not present

## 2023-07-03 LAB — MULTIPLE MYELOMA PANEL, SERUM
Albumin SerPl Elph-Mcnc: 3.7 g/dL (ref 2.9–4.4)
Albumin/Glob SerPl: 1.2 (ref 0.7–1.7)
Alpha 1: 0.3 g/dL (ref 0.0–0.4)
Alpha2 Glob SerPl Elph-Mcnc: 0.9 g/dL (ref 0.4–1.0)
B-Globulin SerPl Elph-Mcnc: 0.9 g/dL (ref 0.7–1.3)
Gamma Glob SerPl Elph-Mcnc: 1.2 g/dL (ref 0.4–1.8)
Globulin, Total: 3.3 g/dL (ref 2.2–3.9)
IgA: 53 mg/dL — ABNORMAL LOW (ref 64–422)
IgG (Immunoglobin G), Serum: 737 mg/dL (ref 586–1602)
IgM (Immunoglobulin M), Srm: 960 mg/dL — ABNORMAL HIGH (ref 26–217)
M Protein SerPl Elph-Mcnc: 0.7 g/dL — ABNORMAL HIGH
Total Protein ELP: 7 g/dL (ref 6.0–8.5)

## 2023-07-03 LAB — GLUCOSE, CAPILLARY
Glucose-Capillary: 122 mg/dL — ABNORMAL HIGH (ref 70–99)
Glucose-Capillary: 108 mg/dL — ABNORMAL HIGH (ref 70–99)

## 2023-07-03 NOTE — Progress Notes (Signed)
Daily Session Note  Patient Details  Name: Jamie Murray MRN: [id number] Date of Birth: [birth year] Referring Provider:   Flowsheet Row Pulmonary Rehab from 05/24/2023 in [facility] Cardiac and Pulmonary Rehab  Referring Provider Dr. Marca Ancona       Encounter Date: 07/03/2023  Check In:  Session Check In - 07/03/23 1357       Check-In   Supervising physician immediately available to respond to emergencies See telemetry face sheet for immediately available ER MD    Location [facility]-Cardiac & Pulmonary Rehab    Staff Present Susann Givens RN,BSN;Maxon Manya Silvas BS, Exercise Physiologist;Joseph Hood RCP,RRT,BSRT;Noah Tickle, [place], Exercise Physiologist    Virtual Visit No    Medication changes reported     No    Fall or balance concerns reported    No    Warm-up and Cool-down Performed on first and last piece of equipment    Resistance Training Performed Yes    VAD Patient? No    PAD/SET Patient? No      Pain Assessment   Currently in Pain? No/denies                Social History   Tobacco Use  Smoking Status Never  Smokeless Tobacco Never    Goals Met:  Independence with exercise equipment Exercise tolerated well No report of concerns or symptoms today Strength training completed today  Goals Unmet:  Not Applicable  Comments: Pt able to follow exercise prescription today without complaint.  Will continue to monitor for progression.    Dr. Bethann Punches is Medical Director for [facility] Cardiac Rehabilitation.  Dr. Vida Rigger is Medical Director for [facility] Pulmonary Rehabilitation.

## 2023-07-04 DIAGNOSIS — F4321 Adjustment disorder with depressed mood: Secondary | ICD-10-CM | POA: Diagnosis not present

## 2023-07-05 ENCOUNTER — Encounter: Payer: Medicare PPO | Admitting: *Deleted

## 2023-07-05 DIAGNOSIS — I272 Pulmonary hypertension, unspecified: Secondary | ICD-10-CM | POA: Diagnosis not present

## 2023-07-05 LAB — GLUCOSE, CAPILLARY
Glucose-Capillary: 99 mg/dL (ref 70–99)
Glucose-Capillary: 96 mg/dL (ref 70–99)

## 2023-07-05 NOTE — Progress Notes (Signed)
Daily Session Note  Patient Details  Name: Jamie Murray MRN: [id number] Date of Birth: 1945-08-25 Referring Provider:   Flowsheet Row Pulmonary Rehab from 05/24/2023 in [facility] Cardiac and Pulmonary Rehab  Referring Provider Dr. Marca Ancona       Encounter Date: 07/05/2023  Check In:  Session Check In - 07/05/23 1346       Check-In   Supervising physician immediately available to respond to emergencies See telemetry face sheet for immediately available ER MD    Location [facility]-Cardiac & Pulmonary Rehab    Staff Present Susann Givens RN,BSN;Kelly Madilyn Fireman BS, ACSM CEP, Exercise Physiologist;Maxon Conetta BS, Exercise Physiologist;Noah Tickle, BS, Exercise Physiologist    Virtual Visit No    Medication changes reported     No    Fall or balance concerns reported    No    Warm-up and Cool-down Performed on first and last piece of equipment    Resistance Training Performed Yes    VAD Patient? No    PAD/SET Patient? No      Pain Assessment   Currently in Pain? No/denies                Social History   Tobacco Use  Smoking Status Never  Smokeless Tobacco Never    Goals Met:  Independence with exercise equipment Exercise tolerated well No report of concerns or symptoms today Strength training completed today  Goals Unmet:  Not Applicable  Comments: Pt able to follow exercise prescription today without complaint.  Will continue to monitor for progression.    Dr. Bethann Punches is Medical Director for [facility] Cardiac Rehabilitation.  Dr. Vida Rigger is Medical Director for [facility] Pulmonary Rehabilitation.

## 2023-07-06 ENCOUNTER — Inpatient Hospital Stay: Payer: Medicare PPO

## 2023-07-06 VITALS — BP 142/71 | HR 62 | Temp 97.2°F | Resp 18

## 2023-07-06 DIAGNOSIS — E1122 Type 2 diabetes mellitus with diabetic chronic kidney disease: Secondary | ICD-10-CM | POA: Diagnosis not present

## 2023-07-06 DIAGNOSIS — M549 Dorsalgia, unspecified: Secondary | ICD-10-CM | POA: Diagnosis not present

## 2023-07-06 DIAGNOSIS — D631 Anemia in chronic kidney disease: Secondary | ICD-10-CM | POA: Diagnosis not present

## 2023-07-06 DIAGNOSIS — G4733 Obstructive sleep apnea (adult) (pediatric): Secondary | ICD-10-CM | POA: Diagnosis not present

## 2023-07-06 DIAGNOSIS — J449 Chronic obstructive pulmonary disease, unspecified: Secondary | ICD-10-CM | POA: Diagnosis not present

## 2023-07-06 DIAGNOSIS — I130 Hypertensive heart and chronic kidney disease with heart failure and stage 1 through stage 4 chronic kidney disease, or unspecified chronic kidney disease: Secondary | ICD-10-CM | POA: Diagnosis not present

## 2023-07-06 DIAGNOSIS — N1832 Chronic kidney disease, stage 3b: Secondary | ICD-10-CM

## 2023-07-06 DIAGNOSIS — M2550 Pain in unspecified joint: Secondary | ICD-10-CM | POA: Diagnosis not present

## 2023-07-06 DIAGNOSIS — D472 Monoclonal gammopathy: Secondary | ICD-10-CM | POA: Diagnosis not present

## 2023-07-06 DIAGNOSIS — I5032 Chronic diastolic (congestive) heart failure: Secondary | ICD-10-CM | POA: Diagnosis not present

## 2023-07-06 MED ORDER — SODIUM CHLORIDE 0.9% FLUSH
10.0000 mL | Freq: Once | INTRAVENOUS | Status: AC | PRN
  Administered 2023-07-06: 10 mL
  Filled 2023-07-06: qty 10

## 2023-07-06 MED ORDER — IRON SUCROSE 20 MG/ML IV SOLN
200.0000 mg | Freq: Once | INTRAVENOUS | Status: AC
  Administered 2023-07-06: 200 mg via INTRAVENOUS
  Filled 2023-07-06: qty 10

## 2023-07-07 LAB — IFE+PROTEIN ELECTRO, 24-HR UR
% BETA, Urine: 0 %
ALPHA 1 URINE: 0 %
Albumin, U: 100 %
Alpha 2, Urine: 0 %
GAMMA GLOBULIN URINE: 0 %
Total Protein, Urine-Ur/day: 83 mg/(24.h) (ref 30–150)
Total Protein, Urine: 5 mg/dL
Total Volume: 1650

## 2023-07-10 ENCOUNTER — Encounter: Payer: Medicare PPO | Attending: Cardiology | Admitting: *Deleted

## 2023-07-10 DIAGNOSIS — I272 Pulmonary hypertension, unspecified: Secondary | ICD-10-CM | POA: Insufficient documentation

## 2023-07-10 NOTE — Progress Notes (Signed)
Daily Session Note  Patient Details  Name: Jamie Murray MRN: [id number] Date of Birth: 1946/04/09 Referring Provider:   Flowsheet Row Pulmonary Rehab from 05/24/2023 in [facility] Cardiac and Pulmonary Rehab  Referring Provider Dr. Marca Ancona       Encounter Date: 07/10/2023  Check In:  Session Check In - 07/10/23 1356       Check-In   Supervising physician immediately available to respond to emergencies See telemetry face sheet for immediately available ER MD    Location [facility]-Cardiac & Pulmonary Rehab    Staff Present Maxon Conetta BS, Exercise Physiologist;Noah Tickle, BS, Exercise Physiologist;Dashauna Heymann Jewel Baize RN,BSN;Joseph Hood RCP,RRT,BSRT    Virtual Visit No    Medication changes reported     No    Fall or balance concerns reported    No    Warm-up and Cool-down Performed on first and last piece of equipment    Resistance Training Performed Yes    VAD Patient? No    PAD/SET Patient? No      Pain Assessment   Currently in Pain? No/denies                Social History   Tobacco Use  Smoking Status Never  Smokeless Tobacco Never    Goals Met:  Independence with exercise equipment Exercise tolerated well No report of concerns or symptoms today Strength training completed today  Goals Unmet:  Not Applicable  Comments: Pt able to follow exercise prescription today without complaint.  Will continue to monitor for progression.    Dr. Bethann Punches is Medical Director for [facility] Cardiac Rehabilitation.  Dr. Vida Rigger is Medical Director for [facility] Pulmonary Rehabilitation.

## 2023-07-12 ENCOUNTER — Encounter: Payer: Medicare PPO | Admitting: *Deleted

## 2023-07-12 DIAGNOSIS — I272 Pulmonary hypertension, unspecified: Secondary | ICD-10-CM | POA: Diagnosis not present

## 2023-07-12 NOTE — Progress Notes (Signed)
 Daily Session Note  Patient Details  Name: Jamie Murray MRN: [id number] Date of Birth: 01/05/1946 Referring Provider:   Conrad Ports Pulmonary Rehab from 05/24/2023 in [facility] Cardiac and Pulmonary Rehab  Referring Provider Dr. Ezra Shuck       Encounter Date: 07/12/2023  Check In:  Session Check In - 07/12/23 1352       Check-In   Supervising physician immediately available to respond to emergencies See telemetry face sheet for immediately available ER MD    Location [facility]-Cardiac & Pulmonary Rehab    Staff Present Maxon Conetta BS, Exercise Physiologist;Kelly Dyane HECKLE, ACSM CEP, Exercise Physiologist;Noah Tickle, BS, Exercise Physiologist;Aristide Waggle Tressa RN,BSN    Virtual Visit No    Medication changes reported     No    Fall or balance concerns reported    No    Warm-up and Cool-down Performed on first and last piece of equipment    Resistance Training Performed Yes    VAD Patient? No    PAD/SET Patient? No      Pain Assessment   Currently in Pain? No/denies                Social History   Tobacco Use  Smoking Status Never  Smokeless Tobacco Never    Goals Met:  Independence with exercise equipment Exercise tolerated well No report of concerns or symptoms today Strength training completed today  Goals Unmet:  Not Applicable  Comments: Pt able to follow exercise prescription today without complaint.  Will continue to monitor for progression.   Reviewed home exercise with pt today from 2:15 pm to 2:26pm.  Pt plans to do chair exercises and walk  for exercise.  Reviewed THR, pulse, RPE, sign and symptoms, pulse oximetery and when to call 911 or MD.  Also discussed weather considerations and indoor options.  Pt voiced understanding.   Dr. Oneil Pinal is Medical Director for [facility] Cardiac Rehabilitation.  Dr. Fuad Aleskerov is Medical Director for [facility] Pulmonary Rehabilitation.

## 2023-07-13 ENCOUNTER — Inpatient Hospital Stay: Payer: Medicare PPO | Attending: Oncology

## 2023-07-13 ENCOUNTER — Other Ambulatory Visit: Payer: Self-pay | Admitting: Family

## 2023-07-13 ENCOUNTER — Telehealth: Payer: Self-pay | Admitting: Family

## 2023-07-13 VITALS — BP 121/62 | HR 62 | Temp 96.8°F

## 2023-07-13 DIAGNOSIS — N183 Chronic kidney disease, stage 3 unspecified: Secondary | ICD-10-CM | POA: Insufficient documentation

## 2023-07-13 DIAGNOSIS — D631 Anemia in chronic kidney disease: Secondary | ICD-10-CM | POA: Diagnosis not present

## 2023-07-13 MED ORDER — IRON SUCROSE 20 MG/ML IV SOLN
200.0000 mg | Freq: Once | INTRAVENOUS | Status: AC
  Administered 2023-07-13: 200 mg via INTRAVENOUS
  Filled 2023-07-13: qty 10

## 2023-07-13 MED ORDER — SODIUM CHLORIDE 0.9% FLUSH
10.0000 mL | Freq: Once | INTRAVENOUS | Status: AC | PRN
  Administered 2023-07-13: 10 mL
  Filled 2023-07-13: qty 10

## 2023-07-13 NOTE — Telephone Encounter (Signed)
 Patient left VM wanting to know why her clonazepam  Rx was refused. Please advise.

## 2023-07-14 ENCOUNTER — Ambulatory Visit: Payer: Medicare PPO | Admitting: Family

## 2023-07-14 ENCOUNTER — Telehealth (HOSPITAL_COMMUNITY): Payer: Self-pay | Admitting: Pharmacy Technician

## 2023-07-14 ENCOUNTER — Encounter: Payer: Self-pay | Admitting: Family

## 2023-07-14 ENCOUNTER — Other Ambulatory Visit: Payer: Self-pay

## 2023-07-14 ENCOUNTER — Other Ambulatory Visit (HOSPITAL_COMMUNITY): Payer: Self-pay

## 2023-07-14 VITALS — BP 150/94 | HR 68 | Ht 67.0 in | Wt 155.8 lb

## 2023-07-14 DIAGNOSIS — N1832 Chronic kidney disease, stage 3b: Secondary | ICD-10-CM | POA: Diagnosis not present

## 2023-07-14 DIAGNOSIS — J449 Chronic obstructive pulmonary disease, unspecified: Secondary | ICD-10-CM

## 2023-07-14 DIAGNOSIS — I5032 Chronic diastolic (congestive) heart failure: Secondary | ICD-10-CM | POA: Diagnosis not present

## 2023-07-14 DIAGNOSIS — Z794 Long term (current) use of insulin: Secondary | ICD-10-CM

## 2023-07-14 DIAGNOSIS — E1165 Type 2 diabetes mellitus with hyperglycemia: Secondary | ICD-10-CM | POA: Diagnosis not present

## 2023-07-14 DIAGNOSIS — F5101 Primary insomnia: Secondary | ICD-10-CM | POA: Diagnosis not present

## 2023-07-14 DIAGNOSIS — E1122 Type 2 diabetes mellitus with diabetic chronic kidney disease: Secondary | ICD-10-CM | POA: Diagnosis not present

## 2023-07-14 DIAGNOSIS — I10 Essential (primary) hypertension: Secondary | ICD-10-CM

## 2023-07-14 MED ORDER — HEMOCYTE PLUS 106-1 MG PO CAPS: 1.0000 | ORAL_CAPSULE | Freq: Every day | ORAL | 1 refills | Status: AC

## 2023-07-14 MED ORDER — FUROSEMIDE 20 MG PO TABS: 40.0000 mg | ORAL_TABLET | Freq: Two times a day (BID) | ORAL | 1 refills | Status: DC

## 2023-07-14 NOTE — Telephone Encounter (Signed)
 Patient had appt today and Jamie Murray took care of her rx

## 2023-07-14 NOTE — Telephone Encounter (Signed)
 Advanced Heart Failure Patient Advocate Encounter  Patients daughter called and asked for us  to help her apply for Farxiga  assistance through [employer]. She has a TAF grant for her PAH medications. This should help her meet the $2000 cap for Medicare spending. When I run a test claim, I cannot see the current co-pay. The patients pharmacy has recently filled the medication (5mg  went through for $0, indicating that the next refill should be $0). I called the patients pharmacy and confirmed the current co-pay is $0. Called patients daughter back to confirm co-pay. The rest of her medications should be $0 through the remainder of the year.  No further assistance is needed.  Almarie JULIANNA Pa, CPhT

## 2023-07-15 NOTE — Assessment & Plan Note (Signed)
Patient is seen by Endocrinology, who manage this condition.  She is well controlled with current therapy.   Will defer to them for further changes to plan of care.

## 2023-07-15 NOTE — Assessment & Plan Note (Signed)
 Refill sent for clonazepam . She will let me know if this doesn't continue to control her insomnia.   Reassess as needed.

## 2023-07-15 NOTE — Assessment & Plan Note (Signed)
 Patient is seen by Cardiology, who manage this condition.  She is well controlled with current therapy.   Will defer to them for further changes to plan of care.

## 2023-07-15 NOTE — Assessment & Plan Note (Signed)
 Patient is seen by Pulmonology, who manage this condition.  She is well controlled with current therapy.   Will defer to them for further changes to plan of care

## 2023-07-15 NOTE — Progress Notes (Signed)
 Established Patient Office Visit  Subjective:  Patient ID: Jamie Murray, female    DOB: 1945/06/07  Age: 78 y.o. MRN: [id number]  Chief Complaint  Patient presents with   Follow-up    2 month follow up    Patient is here today for her 3 months follow up.  She has been feeling fairly well since last appointment.   She does have additional concerns to discuss today.  She has been having a lot of trouble sleeping and she says that she has been waiting for a refill, but I did not get a request from the pharmacy until yesterday.   Labs are due today. She needs refills.   I have reviewed her active problem list, medication list, allergies, notes from last encounter, lab results for her appointment today.      No other concerns at this time.   Past Medical History:  Diagnosis Date   Abnormal electrocardiogram 11/25/2013   Formatting of this note might be different from the original. Last Assessment & Plan: Resolved when went to the ER.  Will f/u with Dr. Fernand Note: Unchanged Formatting of this note might be different from the original. Note: Unchanged Formatting of this note might be different from the original.  Formatting of this note might be different from the original. Last Assessment & Plan: Resolved when we   Allergy    Anemia    Anxiety    Bilateral hand pain 07/25/2018   Cerebral infarction, unspecified (HCC) 01/26/2015   Chest pain 08/09/2016   Chest pain, rule out acute myocardial infarction 08/09/2016   Chronic diastolic heart failure (HCC)    COPD (chronic obstructive pulmonary disease) (HCC)    Depression    Diabetes (HCC)    Diarrhea, functional 01/25/2016   Edema of lower extremity 02/03/2020   Elevated troponin level 01/09/2020   Enlarged RV (right ventricle)    GERD (gastroesophageal reflux disease)    Hepatic steatosis    History of stroke 08/26/2022   Formatting of this note might be different from the original. Last Assessment & Plan:   Formatting of this note might be different from the original.  Continue ASA, statin     Hyperlipidemia    Hypertension    Hypokalemia 12/22/2015   Hypomagnesemia 03/22/2016   Insomnia    Macrocytosis 01/25/2016   Migraines    Nutmeg liver    Osteoporosis    Palpitations 11/25/2013   Formatting of this note might be different from the original. Note: Unchanged   Pulmonary hypertension (HCC)    [facility] spotted fever    Severe pulmonary arterial systolic hypertension (HCC)    Severe tricuspid regurgitation by prior echocardiogram    Sleep apnea    CPAP   Transient cerebral ischemia 2016   Type 2 diabetes mellitus with hyperglycemia (HCC) 02/14/2012   Formatting of this note might be different from the original. Last Assessment & Plan: Hgb A1C is 7.1%.  Don't make any changes now.  Will hopefully increase exercise when pulmonary hypertension is properly treated.  ? Pulmonary rehab Note: Unchanged Formatting of this note might be different from the original. Formatting of this note might be different from the original.  Formatting of this note    Vertigo    every 2-3 months    Past Surgical History:  Procedure Laterality Date   ABDOMINAL HYSTERECTOMY     CORONARY ANGIOPLASTY     ESOPHAGOGASTRODUODENOSCOPY (EGD) WITH PROPOFOL  N/A 08/24/2015   Procedure: ESOPHAGOGASTRODUODENOSCOPY (EGD) WITH  PROPOFOL  with dialation;  Surgeon: Rogelia Copping, MD;  Location: [facility];  Service: Endoscopy;  Laterality: N/A;  Diabetic - oral meds   RIGHT/LEFT HEART CATH AND CORONARY/GRAFT ANGIOGRAPHY N/A 01/13/2020   Procedure: RIGHT/LEFT HEART CATH AND CORONARY/GRAFT ANGIOGRAPHY;  Surgeon: Darron Deatrice LABOR, MD;  Location: [facility];  Service: Cardiovascular;  Laterality: N/A;   TUBAL LIGATION      Social History   Socioeconomic History   Marital status: Widowed    Spouse name: Rutha   Number of children: 3   Years of education: 14   Highest education level: Not on file   Occupational History    Comment: work part time, day care   Occupation: Retired  Tobacco Use   Smoking status: Never   Smokeless tobacco: Never  Vaping Use   Vaping status: Never Used  Substance and Sexual Activity   Alcohol use: No    Alcohol/week: 0.0 standard drinks of alcohol   Drug use: No   Sexual activity: Not Currently    Birth control/protection: None, Post-menopausal  Other Topics Concern   Not on file  Social History Narrative   Caffeine use- coffee 2 cups daily, Coke once a week, green tea daily   Social Drivers of Corporate Investment Banker Strain: Not on file  Food Insecurity: No Food Insecurity (05/15/2023)   Hunger Vital Sign    Worried About Running Out of Food in the Last Year: Never true    Ran Out of Food in the Last Year: Never true  Transportation Needs: No Transportation Needs (05/15/2023)   PRAPARE - Administrator, Civil Service (Medical): No    Lack of Transportation (Non-Medical): No  Physical Activity: Not on file  Stress: Not on file  Social Connections: Not on file  Intimate Partner Violence: Not At Risk (05/15/2023)   Humiliation, Afraid, Rape, and Kick questionnaire    Fear of Current or Ex-Partner: No    Emotionally Abused: No    Physically Abused: No    Sexually Abused: No    Family History  Problem Relation Age of Onset   Diabetes Mother    Hypertension Mother    Stroke Mother    Heart disease Mother        MI   Cancer Father        pancreatic   Stroke Father    Hypertension Sister    Diabetes Brother    Diabetes Maternal Grandfather    Diabetes Sister    Diabetes Sister    Diabetes Brother    Diabetes Brother     Allergies  Allergen Reactions   Atorvastatin      Note: joint pain   Fluoxetine Other (See Comments)    felt crazy   Levemir  [Insulin  Detemir] Other (See Comments)    Bruising   Prozac [Fluoxetine Hcl] Other (See Comments)    felt crazy    Review of Systems  Constitutional:  Positive  for malaise/fatigue.  All other systems reviewed and are negative.      Objective:   BP (!) 150/94   Pulse 68   Ht 5' 7 (1.702 m)   Wt 155 lb 12.8 oz (70.7 kg)   LMP  (LMP Unknown)   SpO2 98%   BMI 24.40 kg/m   Vitals:   07/14/23 1023  BP: (!) 150/94  Pulse: 68  Height: 5' 7 (1.702 m)  Weight: 155 lb 12.8 oz (70.7 kg)  SpO2: 98%  BMI (Calculated): 24.4  Physical Exam Vitals and nursing note reviewed.  Constitutional:      Appearance: Normal appearance. She is normal weight.  HENT:     Head: Normocephalic.  Eyes:     Extraocular Movements: Extraocular movements intact.     Conjunctiva/sclera: Conjunctivae normal.     Pupils: Pupils are equal, round, and reactive to light.  Cardiovascular:     Rate and Rhythm: Normal rate.  Pulmonary:     Effort: Pulmonary effort is normal.  Neurological:     General: No focal deficit present.     Mental Status: She is alert and oriented to person, place, and time. Mental status is at baseline.  Psychiatric:        Mood and Affect: Mood normal.        Behavior: Behavior normal.        Thought Content: Thought content normal.        Judgment: Judgment normal.      No results found for any visits on 07/14/23.  Recent Results (from the past 2160 hours)  ECHOCARDIOGRAM COMPLETE     Status: None   Collection Time: 04/26/23 11:23 AM  Result Value Ref Range   S' Lateral 2.30 cm   AV Area VTI 2.18 cm2   AV Mean grad 4.0 mmHg   Single Plane A4C EF 61.0 %   Single Plane A2C EF 57.9 %   Calc EF 57.8 %   AV Area mean vel 2.01 cm2   Area-P 1/2 2.17 cm2   AR max vel 1.86 cm2   AV Peak grad 8.5 mmHg   Ao pk vel 1.46 m/s   MV VTI 3.08 cm2   Est EF 60 - 65%   Basic Metabolic Panel (BMET)     Status: Abnormal   Collection Time: 04/26/23 12:11 PM  Result Value Ref Range   Sodium 139 135 - 145 mmol/L   Potassium 3.7 3.5 - 5.1 mmol/L   Chloride 106 98 - 111 mmol/L   CO2 24 22 - 32 mmol/L   Glucose, Bld 117 (H) 70 - 99 mg/dL     Comment: Glucose reference range applies only to samples taken after fasting for at least 8 hours.   BUN 16 8 - 23 mg/dL   Creatinine, Ser 8.56 (H) 0.44 - 1.00 mg/dL   Calcium  9.6 8.9 - 10.3 mg/dL   GFR, Estimated 38 (L) >60 mL/min    Comment: (NOTE) Calculated using the CKD-EPI Creatinine Equation (2021)    Anion gap 9 5 - 15    Comment: Performed at [facility], [address] [place], [place] [address]  B Nat Peptide     Status: Abnormal   Collection Time: 04/26/23 12:11 PM  Result Value Ref Range   B Natriuretic Peptide 115.6 (H) 0.0 - 100.0 pg/mL    Comment: Performed at [facility], [address], [place], [place] [address]  POCT Glucose (CBG)     Status: Abnormal   Collection Time: 04/27/23  2:03 PM  Result Value Ref Range   POC Glucose 164 (A) 70 - 99 mg/dl  POCT Glucose (CBG)     Status: Abnormal   Collection Time: 05/12/23  1:11 PM  Result Value Ref Range   POC Glucose 109 (A) 70 - 99 mg/dl  Glucose, capillary     Status: Abnormal   Collection Time: 06/14/23  1:44 PM  Result Value Ref Range   Glucose-Capillary 103 (H) 70 - 99 mg/dL  Comment: Glucose reference range applies only to samples taken after fasting for at least 8 hours.  Glucose, capillary     Status: Abnormal   Collection Time: 06/14/23  2:06 PM  Result Value Ref Range   Glucose-Capillary 124 (H) 70 - 99 mg/dL    Comment: Glucose reference range applies only to samples taken after fasting for at least 8 hours.  Glucose, capillary     Status: Abnormal   Collection Time: 06/14/23  2:54 PM  Result Value Ref Range   Glucose-Capillary 132 (H) 70 - 99 mg/dL    Comment: Glucose reference range applies only to samples taken after fasting for at least 8 hours.  Glucose, capillary     Status: Abnormal   Collection Time: 06/16/23 10:54 AM  Result Value Ref Range   Glucose-Capillary 162 (H) 70 - 99 mg/dL    Comment: Glucose reference range applies only to samples taken after fasting for at  least 8 hours.  Glucose, capillary     Status: Abnormal   Collection Time: 06/16/23 12:04 PM  Result Value Ref Range   Glucose-Capillary 105 (H) 70 - 99 mg/dL    Comment: Glucose reference range applies only to samples taken after fasting for at least 8 hours.  Glucose, capillary     Status: None   Collection Time: 06/19/23  1:36 PM  Result Value Ref Range   Glucose-Capillary 85 70 - 99 mg/dL    Comment: Glucose reference range applies only to samples taken after fasting for at least 8 hours.  Glucose, capillary     Status: Abnormal   Collection Time: 06/19/23  1:55 PM  Result Value Ref Range   Glucose-Capillary 118 (H) 70 - 99 mg/dL    Comment: Glucose reference range applies only to samples taken after fasting for at least 8 hours.  Glucose, capillary     Status: Abnormal   Collection Time: 06/19/23  2:45 PM  Result Value Ref Range   Glucose-Capillary 110 (H) 70 - 99 mg/dL    Comment: Glucose reference range applies only to samples taken after fasting for at least 8 hours.  Glucose, capillary     Status: None   Collection Time: 06/21/23  1:56 PM  Result Value Ref Range   Glucose-Capillary 89 70 - 99 mg/dL    Comment: Glucose reference range applies only to samples taken after fasting for at least 8 hours.  Glucose, capillary     Status: None   Collection Time: 06/21/23  2:17 PM  Result Value Ref Range   Glucose-Capillary 85 70 - 99 mg/dL    Comment: Glucose reference range applies only to samples taken after fasting for at least 8 hours.  Beta 2 microglobulin, serum     Status: Abnormal   Collection Time: 06/29/23  3:14 PM  Result Value Ref Range   Beta-2  Microglobulin 4.2 (H) 0.6 - 2.4 mg/L    Comment: (NOTE) Siemens Immulite 2000 Immunochemiluminometric assay (ICMA) Values obtained with different assay methods or kits cannot be used interchangeably. Results cannot be interpreted as absolute evidence of the presence or absence of malignant disease. Performed At: [facility] [address] [place], [place] [id number] Jennette Shorter MD Ey:[id number]   Comprehensive metabolic panel     Status: Abnormal   Collection Time: 06/29/23  3:14 PM  Result Value Ref Range   Sodium 140 135 - 145 mmol/L   Potassium 4.0 3.5 - 5.1 mmol/L   Chloride 108 98 - 111 mmol/L  CO2 23 22 - 32 mmol/L   Glucose, Bld 164 (H) 70 - 99 mg/dL    Comment: Glucose reference range applies only to samples taken after fasting for at least 8 hours.   BUN 26 (H) 8 - 23 mg/dL   Creatinine, Ser 8.60 (H) 0.44 - 1.00 mg/dL   Calcium  9.1 8.9 - 10.3 mg/dL   Total Protein 7.7 6.5 - 8.1 g/dL   Albumin  4.1 3.5 - 5.0 g/dL   AST 18 15 - 41 U/L   ALT 12 0 - 44 U/L   Alkaline Phosphatase 64 38 - 126 U/L   Total Bilirubin 0.6 0.0 - 1.2 mg/dL   GFR, Estimated 39 (L) >60 mL/min    Comment: (NOTE) Calculated using the CKD-EPI Creatinine Equation (2021)    Anion gap 9 5 - 15    Comment: Performed at [facility], [address] [place], [place] [address]  Lactate dehydrogenase     Status: None   Collection Time: 06/29/23  3:14 PM  Result Value Ref Range   LDH 114 98 - 192 U/L    Comment: Performed at [facility], [address] [place], [place] [address]  Kappa/lambda light chains     Status: Abnormal   Collection Time: 06/29/23  3:14 PM  Result Value Ref Range   Kappa free light chain 46.8 (H) 3.3 - 19.4 mg/L   Lambda free light chains 14.2 5.7 - 26.3 mg/L   Kappa, lambda light chain ratio 3.30 (H) 0.26 - 1.65    Comment: (NOTE) Performed At: [facility]  [address] [place], [place] [id number] Jennette Shorter MD Ey:[id number]   Multiple Myeloma Panel (SPEP&IFE w/QIG)     Status: Abnormal   Collection Time: 06/29/23  3:14 PM  Result Value Ref Range   IgG (Immunoglobin G), Serum 737 586 - 1,602 mg/dL   IgA 53 (L) 64 - 577 mg/dL   IgM (Immunoglobulin M), Srm 960 (H) 26 - 217 mg/dL    Comment: (NOTE) Results confirmed on dilution.    Total Protein ELP  7.0 6.0 - 8.5 g/dL   Albumin  SerPl Elph-Mcnc 3.7 2.9 - 4.4 g/dL   Alpha 1 0.3 0.0 - 0.4 g/dL   Alpha2 Glob SerPl Elph-Mcnc 0.9 0.4 - 1.0 g/dL   B-Globulin SerPl Elph-Mcnc 0.9 0.7 - 1.3 g/dL   Gamma Glob SerPl Elph-Mcnc 1.2 0.4 - 1.8 g/dL   M Protein SerPl Elph-Mcnc 0.7 (H) Not Observed g/dL   Globulin, Total 3.3 2.2 - 3.9 g/dL   Albumin /Glob SerPl 1.2 0.7 - 1.7   IFE 1 Comment (A)     Comment: (NOTE) Immunofixation shows IgM monoclonal protein with kappa light chain specificity.    Please Note Comment     Comment: (NOTE) Protein electrophoresis scan will follow via computer, mail, or courier delivery. Performed At: [facility] [address] [place], [place] [id number] Jennette Shorter MD Ey:[id number]   CBC with Differential/Platelet     Status: Abnormal   Collection Time: 06/29/23  3:14 PM  Result Value Ref Range   WBC 3.9 (L) 4.0 - 10.5 K/uL   RBC 3.73 (L) 3.87 - 5.11 MIL/uL   Hemoglobin 10.4 (L) 12.0 - 15.0 g/dL   HCT 67.1 (L) 63.9 - 53.9 %   MCV 87.9 80.0 - 100.0 fL   MCH 27.9 26.0 - 34.0 pg   MCHC 31.7 30.0 - 36.0 g/dL   RDW 84.4 88.4 - 84.4 %   Platelets 151 150 - 400 K/uL  nRBC 0.0 0.0 - 0.2 %   Neutrophils Relative % 68 %   Neutro Abs 2.6 1.7 - 7.7 K/uL   Lymphocytes Relative 27 %   Lymphs Abs 1.1 0.7 - 4.0 K/uL   Monocytes Relative 4 %   Monocytes Absolute 0.2 0.1 - 1.0 K/uL   Eosinophils Relative 1 %   Eosinophils Absolute 0.0 0.0 - 0.5 K/uL   Basophils Relative 0 %   Basophils Absolute 0.0 0.0 - 0.1 K/uL   Immature Granulocytes 0 %   Abs Immature Granulocytes 0.00 0.00 - 0.07 K/uL    Comment: Performed at [facility], [address] [place], [place] [address]  [place], [place] UR     Status: Abnormal   Collection Time: 07/03/23 12:46 PM  Result Value Ref Range   Total Protein, Urine 5.0 Not Estab. mg/dL   Total Protein, Urine-Ur/day 83 30 - 150 mg/24 hr   Albumin , U 100.0 %   ALPHA 1 URINE 0.0 %   Alpha 2, Urine 0.0 %   %  BETA, Urine 0.0 %   GAMMA GLOBULIN URINE 0.0 %   M-SPIKE %, Urine Not Observed Not Observed %   Immunofixation Result, Urine Comment (A)     Comment: Bence Jones Protein positive; kappa type.   Note: Comment     Comment: (NOTE) Protein electrophoresis scan will follow via computer, mail, or courier delivery. Performed At: [facility] [address] [place], [place] [id number] Jennette Shorter MD Ey:[id number]    Total Volume 1,650     Comment: Performed at [facility], [address] [place], [place] [address]  Glucose, capillary     Status: Abnormal   Collection Time: 07/03/23  1:43 PM  Result Value Ref Range   Glucose-Capillary 122 (H) 70 - 99 mg/dL    Comment: Glucose reference range applies only to samples taken after fasting for at least 8 hours.  Glucose, capillary     Status: Abnormal   Collection Time: 07/03/23  2:47 PM  Result Value Ref Range   Glucose-Capillary 108 (H) 70 - 99 mg/dL    Comment: Glucose reference range applies only to samples taken after fasting for at least 8 hours.  Glucose, capillary     Status: None   Collection Time: 07/05/23  1:52 PM  Result Value Ref Range   Glucose-Capillary 99 70 - 99 mg/dL    Comment: Glucose reference range applies only to samples taken after fasting for at least 8 hours.  Glucose, capillary     Status: None   Collection Time: 07/05/23  2:49 PM  Result Value Ref Range   Glucose-Capillary 96 70 - 99 mg/dL    Comment: Glucose reference range applies only to samples taken after fasting for at least 8 hours.       Assessment & Plan:   Problem List Items Addressed This Visit       Cardiovascular and Mediastinum   Chronic diastolic heart failure [facility])   Patient is seen by Cardiology , who manage this condition.  She is well controlled with current therapy.   Will defer to them for further changes to plan of care.       Relevant Medications   furosemide  (LASIX ) 20 MG tablet     Respiratory   Chronic  obstructive pulmonary disease, unspecified COPD type (HCC)   Patient is seen by Pulmonology, who manage this condition.  She is well controlled with current therapy.   Will defer to them for  further changes to plan of care.         Endocrine   Type 2 diabetes mellitus with hyperglycemia [facility])   Patient is seen by Endocrinology., who manage this condition.  She is well controlled with current therapy.   Will defer to them for further changes to plan of care.       Type II diabetes mellitus with renal manifestations [facility])   Patient is seen by Endocrinology, who manage this condition.  She is well controlled with current therapy.   Will defer to them for further changes to plan of care.         Other   Insomnia - Primary   Refill sent for clonazepam . She will let me know if this doesn't continue to control her insomnia.   Reassess as needed.       Will defer labs as I know she is still seeing Hematology and will have to have more labs drawn at that time.  Refills sent for her medications.   Return in about 3 months (around 10/11/2023).   Total time spent: 20 minutes  ALAN CHRISTELLA ARRANT, FNP  07/14/2023   This document may have been prepared by [facility] Voice Recognition software and as such may include unintentional dictation errors.

## 2023-07-17 ENCOUNTER — Encounter: Payer: Self-pay | Admitting: Oncology

## 2023-07-19 ENCOUNTER — Encounter: Payer: Self-pay | Admitting: *Deleted

## 2023-07-19 DIAGNOSIS — I272 Pulmonary hypertension, unspecified: Secondary | ICD-10-CM

## 2023-07-19 NOTE — Progress Notes (Signed)
Pulmonary Individual Treatment Plan  Patient Details  Name: Jamie Murray MRN: [id number] Date of Birth: 1946-06-06 Referring Provider:   Flowsheet Row Pulmonary Rehab from 05/24/2023 in [facility] Cardiac and Pulmonary Rehab  Referring Provider Dr. Marca Ancona       Initial Encounter Date:  Flowsheet Row Pulmonary Rehab from 05/24/2023 in [facility] Cardiac and Pulmonary Rehab  Date 05/24/23       Visit Diagnosis: Pulmonary hypertension (HCC)  Patient's Home Medications on Admission:  Current Outpatient Medications:    ACCU-CHEK GUIDE test strip, 3 each by Other route 3 (three) times daily as needed for other (low blood sugar symptoms)., Disp: 100 each, Rfl: 6   ambrisentan (LETAIRIS) 10 MG tablet, TAKE 1 TABLET BY MOUTH EVERY DAY, Disp: 30 tablet, Rfl: 3   amLODipine (NORVASC) 10 MG tablet, Take 1 tablet (10 mg total) by mouth daily., Disp: 90 tablet, Rfl: 3   aspirin 81 MG tablet, Take 81 mg by mouth daily., Disp: , Rfl:    Cholecalciferol (VITAMIN D3) 125 MCG (5000 UT) CAPS, Take 1 capsule by mouth daily., Disp: , Rfl:    clonazePAM (KLONOPIN) 1 MG tablet, TAKE 1 TABLET BY MOUTH UP TO TWICE DAILY AS NEEDED FOR ANXIETY, Disp: 60 tablet, Rfl: 1   dapagliflozin propanediol (FARXIGA) 10 MG TABS tablet, Take 1 tablet (10 mg total) by mouth daily before breakfast., Disp: 90 tablet, Rfl: 3   diphenoxylate-atropine (LOMOTIL) 2.5-0.025 MG tablet, Take 1 tablet by mouth 4 (four) times daily as needed for diarrhea or loose stools., Disp: 120 tablet, Rfl: 2   escitalopram (LEXAPRO) 10 MG tablet, TAKE 1 TABLET BY MOUTH EVERY MORNING, Disp: 90 tablet, Rfl: 1   esomeprazole (NEXIUM) 40 MG capsule, Take 1 capsule (40 mg total) by mouth daily., Disp: 90 capsule, Rfl: 1   Fe Fum-FA-B Cmp-C-Zn-Mg-Mn-Cu (HEMOCYTE PLUS) 106-1 MG CAPS, Take 1 capsule by mouth daily., Disp: 90 capsule, Rfl: 1   furosemide (LASIX) 20 MG tablet, Take 2 tablets (40 mg total) by mouth 2 (two) times daily., Disp: 180 tablet,  Rfl: 1   insulin aspart (NOVOLOG) 100 UNIT/ML injection, Inject 4 Units into the skin. Bs greater than 200, Disp: , Rfl:    Insulin Degludec (TRESIBA [place]), Inject 14 Units into the skin at bedtime., Disp: , Rfl:    Insulin Pen Needle (B-D UF III MINI PEN NEEDLES) 31G X 5 MM MISC, 1 Needle by Other route 2 (two) times daily., Disp: 100 each, Rfl: 5   levothyroxine (SYNTHROID) 75 MCG tablet, TAKE 1 TABLET BY MOUTH EVERY MORNING ON AN EMPTY STOMACH, Disp: 90 tablet, Rfl: 1   loratadine (CLARITIN) 10 MG tablet, Take 10 mg by mouth daily. , Disp: , Rfl:    meclizine (ANTIVERT) 25 MG tablet, Take 25 mg by mouth 3 (three) times daily as needed for dizziness. , Disp: , Rfl:    metoprolol succinate (TOPROL-XL) 25 MG 24 hr tablet, Take 1 tablet (25 mg total) by mouth daily., Disp: 90 tablet, Rfl: 3   Multiple Vitamins-Minerals (MULTI ADULT GUMMIES) CHEW, Chew by mouth daily., Disp: , Rfl:    ORENITRAM 0.25 MG TBCR, Take by mouth., Disp: , Rfl:    ORENITRAM 1 MG TBCR, Take by mouth., Disp: , Rfl:    ORENITRAM MONTH 3 0.125 & 0.25 &1 MG TEPK, Take 3 tablets by mouth 3 (three) times daily., Disp: , Rfl:    potassium chloride (KLOR-CON M) 10 MEQ tablet, Take 1 tablet (10 mEq total) by mouth daily., Disp:  30 tablet, Rfl: 0   rosuvastatin (CRESTOR) 10 MG tablet, Take 10 mg by mouth daily., Disp: , Rfl:    tadalafil, PAH, (ADCIRCA) 20 MG tablet, Take 2 tablets (40 mg total) by mouth daily., Disp: 60 tablet, Rfl: 11   Treprostinil Diolamine ER (ORENITRAM) 0.125 MG TBCR, Take 1 tablet by mouth 3 (three) times daily., Disp: , Rfl:   Past Medical History: Past Medical History:  Diagnosis Date   Abnormal electrocardiogram 11/25/2013   Formatting of this note might be different from the original. Last Assessment & Plan: Resolved when went to the ER.  Will f/u with Dr. Welton Flakes Note: Unchanged Formatting of this note might be different from the original. Note: Unchanged Formatting of this note might be different from the  original.  Formatting of this note might be different from the original. Last Assessment & Plan: Resolved when we   Allergy    Anemia    Anxiety    Bilateral hand pain 07/25/2018   Cerebral infarction, unspecified (HCC) 01/26/2015   Chest pain 08/09/2016   Chest pain, rule out acute myocardial infarction 08/09/2016   Chronic diastolic heart failure (HCC)    COPD (chronic obstructive pulmonary disease) (HCC)    Depression    Diabetes (HCC)    Diarrhea, functional 01/25/2016   Edema of lower extremity 02/03/2020   Elevated troponin level 01/09/2020   Enlarged RV (right ventricle)    GERD (gastroesophageal reflux disease)    Hepatic steatosis    History of stroke 08/26/2022   Formatting of this note might be different from the original. Last Assessment & Plan:  Formatting of this note might be different from the original.  Continue ASA, statin     Hyperlipidemia    Hypertension    Hypokalemia 12/22/2015   Hypomagnesemia 03/22/2016   Insomnia    Macrocytosis 01/25/2016   Migraines    Nutmeg liver    Osteoporosis    Palpitations 11/25/2013   Formatting of this note might be different from the original. Note: Unchanged   Pulmonary hypertension (HCC)    [facility] spotted fever    Severe pulmonary arterial systolic hypertension (HCC)    Severe tricuspid regurgitation by prior echocardiogram    Sleep apnea    CPAP   Transient cerebral ischemia 2016   Type 2 diabetes mellitus with hyperglycemia (HCC) 02/14/2012   Formatting of this note might be different from the original. Last Assessment & Plan: Hgb A1C is 7.1%.  Don't make any changes now.  Will hopefully increase exercise when pulmonary hypertension is properly treated.  ? Pulmonary rehab Note: Unchanged Formatting of this note might be different from the original. Formatting of this note might be different from the original.  Formatting of this note    Vertigo    every 2-3 months    Tobacco Use: Social History    Tobacco Use  Smoking Status Never  Smokeless Tobacco Never    Labs: Review Flowsheet  More data exists      Latest Ref Rng & Units 07/03/2020 07/15/2022 10/20/2022 01/20/2023 02/13/2023  Labs for ITP Cardiac and Pulmonary Rehab  Cholestrol 100 - 199 mg/dL 161  096  045  409  -  LDL (calc) 0 - 99 mg/dL 55  70  75  50  -  HDL-C >39 mg/dL 43  42  48  48  -  Trlycerides 0 - 149 mg/dL 811  914  782  956  -  Hemoglobin A1c - - 6.3  8.2  - 6.2        Details       This result is from an external source.          Pulmonary Assessment Scores:  Pulmonary Assessment Scores     Row Name 05/24/23 1238         ADL UCSD   ADL Phase Entry     SOB Score total 5     Rest 0     Walk 1     Stairs 0     Bath 0     Dress 0     Shop 1       CAT Score   CAT Score 4       mMRC Score   mMRC Score 3              UCSD: Self-administered rating of dyspnea associated with activities of daily living (ADLs) 6-point scale (0 = "not at all" to 5 = "maximal or unable to do because of breathlessness")  Scoring Scores range from 0 to 120.  Minimally important difference is 5 units  CAT: CAT can identify the health impairment of COPD patients and is better correlated with disease progression.  CAT has a scoring range of zero to 40. The CAT score is classified into four groups of low (less than 10), medium (10 - 20), high (21-30) and very high (31-40) based on the impact level of disease on health status. A CAT score over 10 suggests significant symptoms.  A worsening CAT score could be explained by an exacerbation, poor medication adherence, poor inhaler technique, or progression of COPD or comorbid conditions.  CAT MCID is 2 points  mMRC: mMRC (Modified Medical Research Council) Dyspnea Scale is used to assess the degree of baseline functional disability in patients of respiratory disease due to dyspnea. No minimal important difference is established. A decrease in score of 1 point or  greater is considered a positive change.   Pulmonary Function Assessment:   Exercise Target Goals: Exercise Program Goal: Individual exercise prescription set using results from initial 6 min walk test and THRR while considering  patient's activity barriers and safety.   Exercise Prescription Goal: Initial exercise prescription builds to 30-45 minutes a day of aerobic activity, 2-3 days per week.  Home exercise guidelines will be given to patient during program as part of exercise prescription that the participant will acknowledge.  Education: Aerobic Exercise: - Group verbal and visual presentation on the components of exercise prescription. Introduces F.I.T.T principle from ACSM for exercise prescriptions.  Reviews F.I.T.T. principles of aerobic exercise including progression. Written material given at graduation. Flowsheet Row Pulmonary Rehab from 05/24/2023 in [facility] Cardiac and Pulmonary Rehab  Education need identified 05/24/23       Education: Resistance Exercise: - Group verbal and visual presentation on the components of exercise prescription. Introduces F.I.T.T principle from ACSM for exercise prescriptions  Reviews F.I.T.T. principles of resistance exercise including progression. Written material given at graduation. Flowsheet Row Pulmonary Rehab from 12/03/2020 in [facility] Cardiac and Pulmonary Rehab  Date 12/03/20  Educator [facility]  Instruction Review Code 1- Verbalizes Understanding        Education: Exercise & Equipment Safety: - Individual verbal instruction and demonstration of equipment use and safety with use of the equipment. Flowsheet Row Pulmonary Rehab from 05/24/2023 in [facility] Cardiac and Pulmonary Rehab  Date 05/24/23  Educator [facility]  Instruction Review Code 1- [employer] Understanding       Education: Exercise  Physiology & General Exercise Guidelines: - Group verbal and written instruction with models to review the exercise physiology of the cardiovascular system and  associated critical values. Provides general exercise guidelines with specific guidelines to those with heart or lung disease.  Flowsheet Row Pulmonary Rehab from 05/24/2023 in [facility] Cardiac and Pulmonary Rehab  Education need identified 05/24/23       Education: Flexibility, Balance, Mind/Body Relaxation: - Group verbal and visual presentation with interactive activity on the components of exercise prescription. Introduces F.I.T.T principle from ACSM for exercise prescriptions. Reviews F.I.T.T. principles of flexibility and balance exercise training including progression. Also discusses the mind body connection.  Reviews various relaxation techniques to help reduce and manage stress (i.e. Deep breathing, progressive muscle relaxation, and visualization). Balance handout provided to take home. Written material given at graduation. Flowsheet Row Pulmonary Rehab from 12/03/2020 in [facility] Cardiac and Pulmonary Rehab  Date 10/08/20  Educator [facility]  Instruction Review Code 1- Verbalizes Understanding       Activity Barriers & Risk Stratification:  Activity Barriers & Cardiac Risk Stratification - 05/24/23 1227       Activity Barriers & Cardiac Risk Stratification   Activity Barriers Back Problems;Muscular Weakness;Assistive Device;Balance Concerns             6 Minute Walk:  6 Minute Walk     Row Name 05/24/23 1221         6 Minute Walk   Phase Initial     Distance 900 feet     Walk Time 6 minutes     # of Rest Breaks 0     MPH 1.7     METS 2.19     RPE 11     Perceived Dyspnea  0     VO2 Peak 7.65     Symptoms No     Resting HR 64 bpm     Resting BP 138/82     Resting Oxygen Saturation  98 %     Exercise Oxygen Saturation  during 6 min walk 96 %     Max Ex. HR 109 bpm     Max Ex. BP 142/80     2 Minute Post BP 130/60       Interval HR   1 Minute HR 80     2 Minute HR 93     3 Minute HR 100     4 Minute HR 103     5 Minute HR 106     6 Minute HR 109     2 Minute  Post HR 65     Interval Heart Rate? Yes       Interval Oxygen   Interval Oxygen? Yes     Baseline Oxygen Saturation % 98 %     1 Minute Oxygen Saturation % 96 %     1 Minute Liters of Oxygen 0 L     2 Minute Oxygen Saturation % 96 %     2 Minute Liters of Oxygen 0 L     3 Minute Oxygen Saturation % 98 %     3 Minute Liters of Oxygen 0 L     4 Minute Oxygen Saturation % 98 %     4 Minute Liters of Oxygen 0 L     5 Minute Oxygen Saturation % 99 %     5 Minute Liters of Oxygen 0 L     6 Minute Oxygen Saturation % 97 %     6 Minute Liters  of Oxygen 0 L     2 Minute Post Oxygen Saturation % 98 %     2 Minute Post Liters of Oxygen 0 L             Oxygen Initial Assessment:  Oxygen Initial Assessment - 05/24/23 1238       Home Oxygen   Home Oxygen Device None    Sleep Oxygen Prescription CPAP    Home Exercise Oxygen Prescription None    Home Resting Oxygen Prescription None    Compliance with Home Oxygen Use No      Initial 6 min Walk   Oxygen Used None      Program Oxygen Prescription   Program Oxygen Prescription None      Intervention   Short Term Goals To learn and understand importance of monitoring SPO2 with pulse oximeter and demonstrate accurate use of the pulse oximeter.;To learn and understand importance of maintaining oxygen saturations>88%;To learn and demonstrate proper pursed lip breathing techniques or other breathing techniques. ;To learn and demonstrate proper use of respiratory medications    Long  Term Goals Verbalizes importance of monitoring SPO2 with pulse oximeter and return demonstration;Exhibits proper breathing techniques, such as pursed lip breathing or other method taught during program session;Maintenance of O2 saturations>88%;Compliance with respiratory medication             Oxygen Re-Evaluation:  Oxygen Re-Evaluation     Row Name 06/14/23 1342 07/03/23 1412           Program Oxygen Prescription   Program Oxygen Prescription --  None        Home Oxygen   Home Oxygen Device -- None      Sleep Oxygen Prescription -- CPAP      Liters per minute -- 0      Home Exercise Oxygen Prescription -- None      Home Resting Oxygen Prescription -- None      Compliance with Home Oxygen Use -- No        Goals/Expected Outcomes   Short Term Goals -- To learn and demonstrate proper pursed lip breathing techniques or other breathing techniques.       Long  Term Goals -- Exhibits proper breathing techniques, such as pursed lip breathing or other method taught during program session      Comments Reviewed PLB technique with pt.  Talked about how it works and it's importance in maintaining their exercise saturations. Informed patient how to perform the Pursed Lipped breathing technique. Told patient to Inhale through the nose and out the mouth with pursed lips to keep their airways open, help oxygenate them better, practice when at rest or doing strenuous activity. Patient Verbalizes understanding of technique and will work on and be reiterated during [facility].      Goals/Expected Outcomes Short: Become more profiecient at using PLB.   Long: Become independent at using PLB. Short: use PLB with exertion. Long: use PLB on exertion proficiently and independently.               Oxygen Discharge (Final Oxygen Re-Evaluation):  Oxygen Re-Evaluation - 07/03/23 1412       Program Oxygen Prescription   Program Oxygen Prescription None      Home Oxygen   Home Oxygen Device None    Sleep Oxygen Prescription CPAP    Liters per minute 0    Home Exercise Oxygen Prescription None    Home Resting Oxygen Prescription None    Compliance with Home Oxygen  Use No      Goals/Expected Outcomes   Short Term Goals To learn and demonstrate proper pursed lip breathing techniques or other breathing techniques.     Long  Term Goals Exhibits proper breathing techniques, such as pursed lip breathing or other method taught during program session     Comments Informed patient how to perform the Pursed Lipped breathing technique. Told patient to Inhale through the nose and out the mouth with pursed lips to keep their airways open, help oxygenate them better, practice when at rest or doing strenuous activity. Patient Verbalizes understanding of technique and will work on and be reiterated during [facility].    Goals/Expected Outcomes Short: use PLB with exertion. Long: use PLB on exertion proficiently and independently.             Initial Exercise Prescription:  Initial Exercise Prescription - 05/24/23 1200       Date of Initial Exercise RX and Referring Provider   Date 05/24/23    Referring Provider Dr. Marca Ancona      Oxygen   Maintain Oxygen Saturation 88% or higher      Recumbant Bike   Level 1    RPM 50    Watts 15    Minutes 15    METs 2.19      NuStep   Level 1    SPM 80    Minutes 15    METs 2.19      REL-XR   Level 1    Speed 50    Minutes 15    METs 2.19      Biostep-RELP   Level 1    SPM 50    Minutes 15    METs 2.19      Track   Laps 22    Minutes 15    METs 2.2      Prescription Details   Frequency (times per week) 2    Duration Progress to 30 minutes of continuous aerobic without signs/symptoms of physical distress      Intensity   THRR 40-80% of Max Heartrate 98-128    Ratings of Perceived Exertion 11-13    Perceived Dyspnea 0-4      Progression   Progression Continue to progress workloads to maintain intensity without signs/symptoms of physical distress.      Resistance Training   Training Prescription Yes    Weight 2    Reps 10-15             Perform Capillary Blood Glucose checks as needed.  Exercise Prescription Changes:   Exercise Prescription Changes     Row Name 05/24/23 1200 06/28/23 0800 07/11/23 0700 07/12/23 1400       Response to Exercise   Blood Pressure (Admit) 138/82 124/62 128/74 --    Blood Pressure (Exercise) 142/80 160/62 148/72 --    Blood  Pressure (Exit) 130/60 110/62 124/80 --    Heart Rate (Admit) 64 bpm 73 bpm 64 bpm --    Heart Rate (Exercise) 109 bpm 114 bpm 93 bpm --    Heart Rate (Exit) 65 bpm 70 bpm 70 bpm --    Oxygen Saturation (Admit) 98 % 95 % 96 % --    Oxygen Saturation (Exercise) 96 % 89 % 96 % --    Oxygen Saturation (Exit) 98 % 95 % 97 % --    Rating of Perceived Exertion (Exercise) 11 11 11  --    Perceived Dyspnea (Exercise) 0 0 0 --  Symptoms back pain 1/10 relieved with rest -- none --    Comments 6 MWT results First two weeks of exercise -- --    Duration -- Progress to 30 minutes of  aerobic without signs/symptoms of physical distress Progress to 30 minutes of  aerobic without signs/symptoms of physical distress Progress to 30 minutes of  aerobic without signs/symptoms of physical distress    Intensity -- THRR unchanged THRR unchanged THRR unchanged      Progression   Progression -- Continue to progress workloads to maintain intensity without signs/symptoms of physical distress. Continue to progress workloads to maintain intensity without signs/symptoms of physical distress. Continue to progress workloads to maintain intensity without signs/symptoms of physical distress.    Average METs -- 2 1.64 1.64      Resistance Training   Training Prescription -- Yes Yes Yes    Weight -- 2lb 2lb 2lb    Reps -- 10-15 10-15 10-15      Interval Training   Interval Training -- No No No      NuStep   Level -- 2 2 2     Minutes -- 15 15 15     METs -- 2.2 2.3 2.3      T5 Nustep   Level -- 2  T6 -- --    Minutes -- 15 -- --    METs -- 1.8 -- --      Biostep-RELP   Level -- -- 2 2    Minutes -- -- 15 15    METs -- -- 1 1      Track   Laps -- -- 5 5    Minutes -- -- 15 15    METs -- -- 1.27 1.27      Home Exercise Plan   Plans to continue exercise at -- -- -- Home (comment)  chair exercises and walking    Frequency -- -- -- Add 2 additional days to program exercise sessions.    Initial Home Exercises  Provided -- -- -- 07/12/23      Oxygen   Maintain Oxygen Saturation -- 88% or higher 88% or higher 88% or higher             Exercise Comments:   Exercise Comments     Row Name 06/14/23 1340           Exercise Comments First full day of exercise!  Patient was oriented to gym and equipment including functions, settings, policies, and procedures.  Patient's individual exercise prescription and treatment plan were reviewed.  All starting workloads were established based on the results of the 6 minute walk test done at initial orientation visit.  The plan for exercise progression was also introduced and progression will be customized based on patient's performance and goals.                Exercise Goals and Review:   Exercise Goals     Row Name 05/24/23 1231             Exercise Goals   Increase Physical Activity Yes       Intervention Provide advice, education, support and counseling about physical activity/exercise needs.;Develop an individualized exercise prescription for aerobic and resistive training based on initial evaluation findings, risk stratification, comorbidities and participant's personal goals.       Expected Outcomes Short Term: Attend rehab on a regular basis to increase amount of physical activity.;Long Term: Add in home exercise to make exercise part of routine and  to increase amount of physical activity.;Long Term: Exercising regularly at least 3-5 days a week.       Increase Strength and Stamina Yes       Intervention Provide advice, education, support and counseling about physical activity/exercise needs.;Develop an individualized exercise prescription for aerobic and resistive training based on initial evaluation findings, risk stratification, comorbidities and participant's personal goals.       Expected Outcomes Short Term: Increase workloads from initial exercise prescription for resistance, speed, and METs.;Short Term: Perform resistance training  exercises routinely during rehab and add in resistance training at home;Long Term: Improve cardiorespiratory fitness, muscular endurance and strength as measured by increased METs and functional capacity ( )       Able to understand and use rate of perceived exertion (RPE) scale Yes       Intervention Provide education and explanation on how to use RPE scale       Expected Outcomes Short Term: Able to use RPE daily in rehab to express subjective intensity level;Long Term:  Able to use RPE to guide intensity level when exercising independently       Able to understand and use Dyspnea scale Yes       Intervention Provide education and explanation on how to use Dyspnea scale       Expected Outcomes Short Term: Able to use Dyspnea scale daily in rehab to express subjective sense of shortness of breath during exertion;Long Term: Able to use Dyspnea scale to guide intensity level when exercising independently       Knowledge and understanding of Target Heart Rate Range (THRR) Yes       Intervention Provide education and explanation of THRR including how the numbers were predicted and where they are located for reference       Expected Outcomes Short Term: Able to state/look up THRR;Long Term: Able to use THRR to govern intensity when exercising independently;Short Term: Able to use daily as guideline for intensity in rehab       Able to check pulse independently Yes       Intervention Provide education and demonstration on how to check pulse in carotid and radial arteries.;Review the importance of being able to check your own pulse for safety during independent exercise       Expected Outcomes Short Term: Able to explain why pulse checking is important during independent exercise;Long Term: Able to check pulse independently and accurately       Understanding of Exercise Prescription Yes       Intervention Provide education, explanation, and written materials on patient's individual exercise prescription        Expected Outcomes Short Term: Able to explain program exercise prescription;Long Term: Able to explain home exercise prescription to exercise independently                Exercise Goals Re-Evaluation :  Exercise Goals Re-Evaluation     Row Name 06/14/23 1341 06/28/23 0816 07/11/23 0745 07/12/23 1427       Exercise Goal Re-Evaluation   Exercise Goals Review Increase Physical Activity;Able to understand and use rate of perceived exertion (RPE) scale;Knowledge and understanding of Target Heart Rate Range (THRR);Understanding of Exercise Prescription;Increase Strength and Stamina;Able to understand and use Dyspnea scale;Able to check pulse independently Increase Physical Activity;Increase Strength and Stamina;Understanding of Exercise Prescription Increase Physical Activity;Increase Strength and Stamina;Understanding of Exercise Prescription Able to understand and use rate of perceived exertion (RPE) scale;Increase Physical Activity;Knowledge and understanding of Target Heart Rate Range (THRR);Understanding of  Exercise Prescription;Increase Strength and Stamina;Able to understand and use Dyspnea scale;Able to check pulse independently    Comments Reviewed RPE and dyspnea scale, THR and program prescription with pt today.  Pt voiced understanding and was given a copy of goals to take home. Jacquelin is off to a good start in the program. She was able to use both the T4 and T6 nustep at an intensity of level 2. We will try to incorporate new exercise machines to her, in order to diversify her prescription. We will continue to monitor her progress in the program. Akeiba is doing well in rehab. She was able to maintain a workload of level 2 on the T4 nustep as well as increase to level 2 on the biostep. We will continue to monitor and encourage her progression in the program. Reviewed home exercise with pt today from 2:15 pm to 2:26pm.  Pt plans to do chair exercises and walk  for exercise.  Reviewed THR,  pulse, RPE, sign and symptoms, pulse oximetery and when to call 911 or MD.  Also discussed weather considerations and indoor options.  Pt voiced understanding.    Expected Outcomes Short: Use RPE daily to regulate intensity.  Long: Follow program prescription in THR. Short: Continue to follow current exercise prescription. Long: Continue exercise to improve strength and stamina. Short: Continue to increase workloads, push for more laps on the track. Long: Continue exercise to improvre strength and stamina. Short: add 1-2 days a week of home exercise on off days of rehab. Plans to do chair exercises and walk. Long: maintain independent exercise routine upon graduation from pulmonary rehab.             Discharge Exercise Prescription (Final Exercise Prescription Changes):  Exercise Prescription Changes - 07/12/23 1400       Response to Exercise   Duration Progress to 30 minutes of  aerobic without signs/symptoms of physical distress    Intensity THRR unchanged      Progression   Progression Continue to progress workloads to maintain intensity without signs/symptoms of physical distress.    Average METs 1.64      Resistance Training   Training Prescription Yes    Weight 2lb    Reps 10-15      Interval Training   Interval Training No      NuStep   Level 2    Minutes 15    METs 2.3      Biostep-RELP   Level 2    Minutes 15    METs 1      Track   Laps 5    Minutes 15    METs 1.27      Home Exercise Plan   Plans to continue exercise at Home (comment)   chair exercises and walking   Frequency Add 2 additional days to program exercise sessions.    Initial Home Exercises Provided 07/12/23      Oxygen   Maintain Oxygen Saturation 88% or higher             Nutrition:  Target Goals: Understanding of nutrition guidelines, daily intake of sodium 1500mg , cholesterol 200mg , calories 30% from fat and 7% or less from saturated fats, daily to have 5 or more servings of fruits  and vegetables.  Education: All About Nutrition: -Group instruction provided by verbal, written material, interactive activities, discussions, models, and posters to present general guidelines for heart healthy nutrition including fat, fiber, MyPlate, the role of sodium in heart healthy nutrition, utilization  of the nutrition label, and utilization of this knowledge for meal planning. Follow up email sent as well. Written material given at graduation. Flowsheet Row Pulmonary Rehab from 12/03/2020 in [facility] Cardiac and Pulmonary Rehab  Date 10/15/20  Educator [facility]  Instruction Review Code 1- Verbalizes Understanding       Biometrics:  Pre Biometrics - 05/24/23 1231       Pre Biometrics   Height 5\' 7"  (1.702 m)    Weight 160 lb 8 oz (72.8 kg)    Waist Circumference 37.5 inches    Hip Circumference 40 inches    Waist to Hip Ratio 0.94 %    BMI (Calculated) 25.13    Single Leg Stand 0 seconds   attempted but was not able to stand on one foot.             Nutrition Therapy Plan and Nutrition Goals:  Nutrition Therapy & Goals - 05/24/23 1233       Nutrition Therapy   RD appointment deferred Yes             Nutrition Assessments:  MEDIFICTS Score Key: >=70 Need to make dietary changes  40-70 Heart Healthy Diet <= 40 Therapeutic Level Cholesterol Diet  Flowsheet Row Pulmonary Rehab from 05/24/2023 in [facility] Cardiac and Pulmonary Rehab  Picture Your Plate Total Score on Admission 61      Picture Your Plate Scores: <11 Unhealthy dietary pattern with much room for improvement. 41-50 Dietary pattern unlikely to meet recommendations for good health and room for improvement. 51-60 More healthful dietary pattern, with some room for improvement.  >60 Healthy dietary pattern, although there may be some specific behaviors that could be improved.   Nutrition Goals Re-Evaluation:  Nutrition Goals Re-Evaluation     Row Name 07/03/23 1414             Goals   Comment  Patient was informed on why it is important to maintain a balanced diet when dealing with Respiratory issues. Explained that it takes a lot of energy to breath and when they are short of breath often they will need to have a good diet to help keep up with the calories they are expending for breathing.       Expected Outcome Short: Choose and plan snacks accordingly to patients caloric intake to improve breathing. Long: Maintain a diet independently that meets their caloric intake to aid in daily shortness of breath.                Nutrition Goals Discharge (Final Nutrition Goals Re-Evaluation):  Nutrition Goals Re-Evaluation - 07/03/23 1414       Goals   Comment Patient was informed on why it is important to maintain a balanced diet when dealing with Respiratory issues. Explained that it takes a lot of energy to breath and when they are short of breath often they will need to have a good diet to help keep up with the calories they are expending for breathing.    Expected Outcome Short: Choose and plan snacks accordingly to patients caloric intake to improve breathing. Long: Maintain a diet independently that meets their caloric intake to aid in daily shortness of breath.             Psychosocial: Target Goals: Acknowledge presence or absence of significant depression and/or stress, maximize coping skills, provide positive support system. Participant is able to verbalize types and ability to use techniques and skills needed for reducing stress and depression.  Education: Stress, Anxiety, and Depression - Group verbal and visual presentation to define topics covered.  Reviews how body is impacted by stress, anxiety, and depression.  Also discusses healthy ways to reduce stress and to treat/manage anxiety and depression.  Written material given at graduation. Flowsheet Row Pulmonary Rehab from 12/03/2020 in [facility] Cardiac and Pulmonary Rehab  Date 11/12/20  Educator [facility]  Instruction Review  Code 1- [employer] Understanding       Education: Sleep Hygiene -Provides group verbal and written instruction about how sleep can affect your health.  Define sleep hygiene, discuss sleep cycles and impact of sleep habits. Review good sleep hygiene tips.    Initial Review & Psychosocial Screening:  Initial Psych Review & Screening - 05/23/23 1307       Initial Review   Current issues with Current Sleep Concerns;Current Depression;History of Depression;Current Stress Concerns      Family Dynamics   Good Support System? Yes      Barriers   Psychosocial barriers to participate in program The patient should benefit from training in stress management and relaxation.      Screening Interventions   Interventions Encouraged to exercise;Provide feedback about the scores to participant;To provide support and resources with identified psychosocial needs    Expected Outcomes Short Term goal: Utilizing psychosocial counselor, staff and physician to assist with identification of specific Stressors or current issues interfering with healing process. Setting desired goal for each stressor or current issue identified.;Long Term Goal: Stressors or current issues are controlled or eliminated.;Short Term goal: Identification and review with participant of any Quality of Life or Depression concerns found by scoring the questionnaire.;Long Term goal: The participant improves quality of Life and PHQ9 Scores as seen by post scores and/or verbalization of changes             Quality of Life Scores:  Scores of 19 and below usually indicate a poorer quality of life in these areas.  A difference of  2-3 points is a clinically meaningful difference.  A difference of 2-3 points in the total score of the Quality of Life Index has been associated with significant improvement in overall quality of life, self-image, physical symptoms, and general health in studies assessing change in quality of  life.  PHQ-9: Review Flowsheet  More data exists      07/03/2023 05/24/2023 05/15/2023 12/01/2020 11/17/2020  Depression screen PHQ 2/9  Decreased Interest 3 3 0 0 1  Down, Depressed, Hopeless 2 2 1  0 0  PHQ - 2 Score 5 5 1  0 1  Altered sleeping 3 3 - 0 1  Tired, decreased energy 1 2 - 1 1  Change in appetite 3 0 - 0 0  Feeling bad or failure about yourself  2 1 - 0 0  Trouble concentrating 0 0 - 0 0  Moving slowly or fidgety/restless 0 0 - 0 0  Suicidal thoughts 0 0 - 0 0  PHQ-9 Score 14 11 - 1 3  Difficult doing work/chores Somewhat difficult Somewhat difficult - Not difficult at all Not difficult at all   Interpretation of Total Score  Total Score Depression Severity:  1-4 = Minimal depression, 5-9 = Mild depression, 10-14 = Moderate depression, 15-19 = Moderately severe depression, 20-27 = Severe depression   Psychosocial Evaluation and Intervention:  Psychosocial Evaluation - 05/23/23 1314       Psychosocial Evaluation & Interventions   Interventions Encouraged to exercise with the program and follow exercise prescription;Stress management education;Relaxation education  Comments Antoninette it coming to pulmonary rehab wtih pulmonary htn. She has done the program before and knows what to expect. She states she really doesn't want to come, but knows she needs to take care of herself so she is going to try. She is still recovering from losing her husband September of last year and the financial and emotional stress she was left with. She has been seeing a therapist which has helped her a lot so she has someone she can "vent to." Almira states she has been sleeping well with her medicine. She feels like she is ready to move forward in managing her health.    Expected Outcomes Short: attend pulmonary rehab for education and exercise. Long: develop positive self care habits.    Continue Psychosocial Services  Follow up required by staff             Psychosocial Re-Evaluation:   Psychosocial Re-Evaluation     Row Name 07/03/23 1422             Psychosocial Re-Evaluation   Current issues with Current Stress Concerns;Current Sleep Concerns       Comments Reviewed patient health questionnaire (PHQ-9) with patient for follow up. Previously, patients score indicated signs/symptoms of depression.  Reviewed to see if patient is improving symptom wise while in program.  Score declined and patient states that it is because she has a diagnosis that is causing her to go to the Cancer center. She states she sleeps good but gets tired easily and sleeps to much.       Expected Outcomes Short: Continue to work toward an improvement in PHQ9 scores by attending [facility] regularly. Long: Continue to improve stress and depression coping skills by talking with staff and attending [facility] regularly and work toward a positive mental state.       Interventions Encouraged to attend Pulmonary Rehabilitation for the exercise       Continue Psychosocial Services  Follow up required by staff         Initial Review   Source of Stress Concerns Chronic Illness;Family                Psychosocial Discharge (Final Psychosocial Re-Evaluation):  Psychosocial Re-Evaluation - 07/03/23 1422       Psychosocial Re-Evaluation   Current issues with Current Stress Concerns;Current Sleep Concerns    Comments Reviewed patient health questionnaire (PHQ-9) with patient for follow up. Previously, patients score indicated signs/symptoms of depression.  Reviewed to see if patient is improving symptom wise while in program.  Score declined and patient states that it is because she has a diagnosis that is causing her to go to the Cancer center. She states she sleeps good but gets tired easily and sleeps to much.    Expected Outcomes Short: Continue to work toward an improvement in PHQ9 scores by attending [facility] regularly. Long: Continue to improve stress and depression coping skills by talking with staff  and attending [facility] regularly and work toward a positive mental state.    Interventions Encouraged to attend Pulmonary Rehabilitation for the exercise    Continue Psychosocial Services  Follow up required by staff      Initial Review   Source of Stress Concerns Chronic Illness;Family             Education: Education Goals: Education classes will be provided on a weekly basis, covering required topics. Participant will state understanding/return demonstration of topics presented.  Learning Barriers/Preferences:  Learning Barriers/Preferences - 05/23/23 1307  Learning Barriers/Preferences   Learning Barriers None    Learning Preferences None             General Pulmonary Education Topics:  Infection Prevention: - Provides verbal and written material to individual with discussion of infection control including proper hand washing and proper equipment cleaning during exercise session. Flowsheet Row Pulmonary Rehab from 05/24/2023 in [facility] Cardiac and Pulmonary Rehab  Date 05/24/23  Educator [facility]  Instruction Review Code 1- Verbalizes Understanding       Falls Prevention: - Provides verbal and written material to individual with discussion of falls prevention and safety. Flowsheet Row Pulmonary Rehab from 05/24/2023 in [facility] Cardiac and Pulmonary Rehab  Date 05/24/23  Educator [facility]  Instruction Review Code 1- Verbalizes Understanding       Chronic Lung Disease Review: - Group verbal instruction with posters, models, PowerPoint presentations and videos,  to review new updates, new respiratory medications, new advancements in procedures and treatments. Providing information on websites and "800" numbers for continued self-education. Includes information about supplement oxygen, available portable oxygen systems, continuous and intermittent flow rates, oxygen safety, concentrators, and Medicare reimbursement for oxygen. Explanation of Pulmonary Drugs, including class,  frequency, complications, importance of spacers, rinsing mouth after steroid MDI's, and proper cleaning methods for nebulizers. Review of basic lung anatomy and physiology related to function, structure, and complications of lung disease. Review of risk factors. Discussion about methods for diagnosing sleep apnea and types of masks and machines for OSA. Includes a review of the use of types of environmental controls: home humidity, furnaces, filters, dust mite/pet prevention, HEPA vacuums. Discussion about weather changes, air quality and the benefits of nasal washing. Instruction on Warning signs, infection symptoms, calling MD promptly, preventive modes, and value of vaccinations. Review of effective airway clearance, coughing and/or vibration techniques. Emphasizing that all should Create an Action Plan. Written material given at graduation. Flowsheet Row Pulmonary Rehab from 05/24/2023 in [facility] Cardiac and Pulmonary Rehab  Education need identified 05/24/23       AED/CPR: - Group verbal and written instruction with the use of models to demonstrate the basic use of the AED with the basic ABC's of resuscitation.    Anatomy and Cardiac Procedures: - Group verbal and visual presentation and models provide information about basic cardiac anatomy and function. Reviews the testing methods done to diagnose heart disease and the outcomes of the test results. Describes the treatment choices: Medical Management, Angioplasty, or Coronary Bypass Surgery for treating various heart conditions including Myocardial Infarction, Angina, Valve Disease, and Cardiac Arrhythmias.  Written material given at graduation. Flowsheet Row Pulmonary Rehab from 12/03/2020 in [facility] Cardiac and Pulmonary Rehab  Date 12/03/20  Educator [facility]  Instruction Review Code 1- Verbalizes Understanding       Medication Safety: - Group verbal and visual instruction to review commonly prescribed medications for heart and lung disease.  Reviews the medication, class of the drug, and side effects. Includes the steps to properly store meds and maintain the prescription regimen.  Written material given at graduation. Flowsheet Row Pulmonary Rehab from 12/03/2020 in [facility] Cardiac and Pulmonary Rehab  Date 10/22/20  Educator [facility]  Instruction Review Code 1- Verbalizes Understanding       Other: -Provides group and verbal instruction on various topics (see comments)   Knowledge Questionnaire Score:  Knowledge Questionnaire Score - 05/24/23 1237       Knowledge Questionnaire Score   Pre Score 9/18  Core Components/Risk Factors/Patient Goals at Admission:  Personal Goals and Risk Factors at Admission - 05/24/23 1237       Core Components/Risk Factors/Patient Goals on Admission    Weight Management Yes;Weight Maintenance    Intervention Weight Management: Develop a combined nutrition and exercise program designed to reach desired caloric intake, while maintaining appropriate intake of nutrient and fiber, sodium and fats, and appropriate energy expenditure required for the weight goal.;Weight Management: Provide education and appropriate resources to help participant work on and attain dietary goals.;Weight Management/Obesity: Establish reasonable short term and long term weight goals.    Admit Weight 160 lb 8 oz (72.8 kg)    Goal Weight: Short Term 160 lb (72.6 kg)    Goal Weight: Long Term 160 lb (72.6 kg)    Expected Outcomes Short Term: Continue to assess and modify interventions until short term weight is achieved;Long Term: Adherence to nutrition and physical activity/exercise program aimed toward attainment of established weight goal;Weight Maintenance: Understanding of the daily nutrition guidelines, which includes 25-35% calories from fat, 7% or less cal from saturated fats, less than 200mg  cholesterol, less than 1.5gm of sodium, & 5 or more servings of fruits and vegetables daily;Understanding  recommendations for meals to include 15-35% energy as protein, 25-35% energy from fat, 35-60% energy from carbohydrates, less than 200mg  of dietary cholesterol, 20-35 gm of total fiber daily;Understanding of distribution of calorie intake throughout the day with the consumption of 4-5 meals/snacks    Improve shortness of breath with ADL's Yes    Intervention Provide education, individualized exercise plan and daily activity instruction to help decrease symptoms of SOB with activities of daily living.    Expected Outcomes Short Term: Improve cardiorespiratory fitness to achieve a reduction of symptoms when performing ADLs;Long Term: Be able to perform more ADLs without symptoms or delay the onset of symptoms    Diabetes Yes    Intervention Provide education about signs/symptoms and action to take for hypo/hyperglycemia.;Provide education about proper nutrition, including hydration, and aerobic/resistive exercise prescription along with prescribed medications to achieve blood glucose in normal ranges: Fasting glucose 65-99 mg/dL    Expected Outcomes Short Term: Participant verbalizes understanding of the signs/symptoms and immediate care of hyper/hypoglycemia, proper foot care and importance of medication, aerobic/resistive exercise and nutrition plan for blood glucose control.;Long Term: Attainment of HbA1C < 7%.    Heart Failure Yes    Intervention Provide a combined exercise and nutrition program that is supplemented with education, support and counseling about heart failure. Directed toward relieving symptoms such as shortness of breath, decreased exercise tolerance, and extremity edema.    Expected Outcomes Improve functional capacity of life;Short term: Attendance in program 2-3 days a week with increased exercise capacity. Reported lower sodium intake. Reported increased fruit and vegetable intake. Reports medication compliance.;Short term: Daily weights obtained and reported for increase. Utilizing  diuretic protocols set by physician.;Long term: Adoption of self-care skills and reduction of barriers for early signs and symptoms recognition and intervention leading to self-care maintenance.    Hypertension Yes    Intervention Provide education on lifestyle modifcations including regular physical activity/exercise, weight management, moderate sodium restriction and increased consumption of fresh fruit, vegetables, and low fat dairy, alcohol moderation, and smoking cessation.;Monitor prescription use compliance.    Expected Outcomes Short Term: Continued assessment and intervention until BP is < 140/69mm HG in hypertensive participants. < 130/104mm HG in hypertensive participants with diabetes, heart failure or chronic kidney disease.;Long Term: Maintenance of blood pressure at goal levels.  Lipids Yes    Intervention Provide education and support for participant on nutrition & aerobic/resistive exercise along with prescribed medications to achieve LDL 70mg , HDL >40mg .    Expected Outcomes Short Term: Participant states understanding of desired cholesterol values and is compliant with medications prescribed. Participant is following exercise prescription and nutrition guidelines.;Long Term: Cholesterol controlled with medications as prescribed, with individualized exercise RX and with personalized nutrition plan. Value goals: LDL < 70mg , HDL > 40 mg.             Education:Diabetes - Individual verbal and written instruction to review signs/symptoms of diabetes, desired ranges of glucose level fasting, after meals and with exercise. Acknowledge that pre and post exercise glucose checks will be done for 3 sessions at entry of program. Flowsheet Row Pulmonary Rehab from 05/24/2023 in [facility] Cardiac and Pulmonary Rehab  Date 05/24/23  Educator [facility]  Instruction Review Code 1- Verbalizes Understanding       Know Your Numbers and Heart Failure: - Group verbal and visual instruction to discuss  disease risk factors for cardiac and pulmonary disease and treatment options.  Reviews associated critical values for Overweight/Obesity, Hypertension, Cholesterol, and Diabetes.  Discusses basics of heart failure: signs/symptoms and treatments.  Introduces Heart Failure Zone chart for action plan for heart failure.  Written material given at graduation. Flowsheet Row Pulmonary Rehab from 12/03/2020 in [facility] Cardiac and Pulmonary Rehab  Date 10/29/20  Educator [facility]  Instruction Review Code 1- Verbalizes Understanding       Core Components/Risk Factors/Patient Goals Review:   Goals and Risk Factor Review     Row Name 07/03/23 1413             Core Components/Risk Factors/Patient Goals Review   Personal Goals Review Improve shortness of breath with ADL's       Review Spoke to patient about their shortness of breath and what they can do to improve. Patient has been informed of breathing techniques when starting the program. Patient is informed to tell staff if they have had any med changes and that certain meds they are taking or not taking can be causing shortness of breath.       Expected Outcomes Short: Attend [facility] regularly to improve shortness of breath with ADL's. Long: maintain independence with ADL's                Core Components/Risk Factors/Patient Goals at Discharge (Final Review):   Goals and Risk Factor Review - 07/03/23 1413       Core Components/Risk Factors/Patient Goals Review   Personal Goals Review Improve shortness of breath with ADL's    Review Spoke to patient about their shortness of breath and what they can do to improve. Patient has been informed of breathing techniques when starting the program. Patient is informed to tell staff if they have had any med changes and that certain meds they are taking or not taking can be causing shortness of breath.    Expected Outcomes Short: Attend [facility] regularly to improve shortness of breath with ADL's. Long:  maintain independence with ADL's             ITP Comments:  ITP Comments     Row Name 05/24/23 1213 06/14/23 1340 06/21/23 1259 07/19/23 0808     ITP Comments Completed and gym orientation. Initial ITP created and sent for review to Dr. Jinny Sanders, Medical Director. First full day of exercise!  Patient was oriented to gym and equipment including functions, settings,  policies, and procedures.  Patient's individual exercise prescription and treatment plan were reviewed.  All starting workloads were established based on the results of the 6 minute walk test done at initial orientation visit.  The plan for exercise progression was also introduced and progression will be customized based on patient's performance and goals. 30 Day review completed. Medical Director ITP review done, changes made as directed, and signed approval by Medical Director.    new to program 30 Day review completed. Medical Director ITP review done, changes made as directed, and signed approval by Medical Director.             Comments:

## 2023-07-20 ENCOUNTER — Inpatient Hospital Stay (HOSPITAL_BASED_OUTPATIENT_CLINIC_OR_DEPARTMENT_OTHER): Payer: Medicare PPO | Admitting: Oncology

## 2023-07-20 ENCOUNTER — Inpatient Hospital Stay: Payer: Medicare PPO

## 2023-07-20 ENCOUNTER — Encounter: Payer: Self-pay | Admitting: Oncology

## 2023-07-20 VITALS — BP 136/98 | HR 73 | Resp 17

## 2023-07-20 VITALS — BP 124/83 | HR 87 | Temp 96.0°F | Resp 18 | Wt 156.4 lb

## 2023-07-20 DIAGNOSIS — D631 Anemia in chronic kidney disease: Secondary | ICD-10-CM

## 2023-07-20 DIAGNOSIS — N183 Chronic kidney disease, stage 3 unspecified: Secondary | ICD-10-CM | POA: Diagnosis not present

## 2023-07-20 DIAGNOSIS — N1832 Chronic kidney disease, stage 3b: Secondary | ICD-10-CM

## 2023-07-20 DIAGNOSIS — D472 Monoclonal gammopathy: Secondary | ICD-10-CM | POA: Diagnosis not present

## 2023-07-20 MED ORDER — IRON SUCROSE 20 MG/ML IV SOLN
200.0000 mg | Freq: Once | INTRAVENOUS | Status: AC
  Administered 2023-07-20: 200 mg via INTRAVENOUS

## 2023-07-20 NOTE — Assessment & Plan Note (Addendum)
IgM kappa MGUS- M protein 0.7, light chain ratio 3.46 Lab Results  Component Value Date   MPROTEIN 0.7 (H) 06/29/2023   KPAFRELGTCHN 46.8 (H) 06/29/2023   LAMBDASER 14.2 06/29/2023   KAPLAMBRATIO 3.30 (H) 06/29/2023   Stable M protein  I discussed with patient about the diagnosis of IgM MGUS which is an asymptomatic condition which has a small risk of progression to smoldering Waldenstrm macroglobulinemia and to symptomatic [place] macroglobulinemia, and less often to lymphoma or AL amyloidosis. Infrequently, IgM MGUS can progress to IgM multiple myeloma. I recommend observation. Check multiple myeloma panel, light chain ratio every 6 months, NT proBNP annually.

## 2023-07-20 NOTE — Progress Notes (Signed)
Hematology/Oncology Progress note [phone] [phone]      Patient Care Team: Miki Kins, FNP as PCP - General (Family Medicine) Laurier Nancy, MD as Consulting Physician (Cardiology) Rickard Patience, MD as Consulting Physician (Oncology)   CHIEF COMPLAINTS/REASON FOR VISIT:  Follow up for anemia, MGUS  ASSESSMENT & PLAN:   MGUS (monoclonal gammopathy of unknown significance) IgM kappa MGUS- M protein 0.7, light chain ratio 3.46 Lab Results  Component Value Date   MPROTEIN 0.7 (H) 06/29/2023   KPAFRELGTCHN 46.8 (H) 06/29/2023   LAMBDASER 14.2 06/29/2023   KAPLAMBRATIO 3.30 (H) 06/29/2023    I discussed with patient about the diagnosis of IgM MGUS which is an asymptomatic condition which has a small risk of progression to smoldering Waldenstrm macroglobulinemia and to symptomatic Wal[place]strm macroglobulinemia, and less often to lymphoma or AL amyloidosis. Infrequently, IgM MGUS can progress to IgM multiple myeloma. I recommend observation. Check multiple myeloma panel, light chain ratio every 6 months, NT proBNP annually.    Anemia due to stage 3b chronic kidney disease (HCC) Recent Labs were reviewed and discussed with patient. Lab Results  Component Value Date   HGB 10.4 (L) 06/29/2023   TIBC 287 02/26/2021   IRONPCTSAT 10 (L) 02/26/2021   FERRITIN 28 02/26/2021    Anemia is most likely due to CKD stage 3b Finish 3 weekly dose of Venofer. HB is stable.   Orders Placed This Encounter  Procedures   CBC with Differential (Cancer Center Only)    Standing Status:   Future    Expected Date:   01/17/2024    Expiration Date:   07/19/2024   CMP (Cancer Center only)    Standing Status:   Future    Expected Date:   01/17/2024    Expiration Date:   07/19/2024   Iron and TIBC    Standing Status:   Future    Expected Date:   01/17/2024    Expiration Date:   07/19/2024   Ferritin    Standing Status:   Future    Expected Date:   01/17/2024     Expiration Date:   07/19/2024   Multiple Myeloma Panel (SPEP&IFE w/QIG)    Standing Status:   Future    Expected Date:   01/17/2024    Expiration Date:   07/19/2024   Kappa/lambda light chains    Standing Status:   Future    Expected Date:   01/17/2024    Expiration Date:   07/19/2024   We spent sufficient time to discuss many aspect of care, questions were answered to patient's satisfaction.  Rickard Patience, MD, PhD [facility] Hematology Oncology 07/20/2023   HISTORY OF PRESENTING ILLNESS:   Jamie Murray is a  78 y.o.  female with PMH listed below was seen in consultation at the request of  Miki Kins, FNP  for evaluation of anemia   11/11/2020 cbc showed hemoglobin 9.3, mcv 85.4 Wbc 3.3, platelet count 132 Anemia is chronic, since at least 2017 Patient has chronic kidney disease following nephrology.  She report feeling fatigued. No bloody or black stool, no abdominal pain, unintentional weight loss, fever, night sweats.  She takes Hemocyte plus.    INTERVAL HISTORY Jamie Murray is a 78 y.o. female who has above history reviewed by me today presents to re- establish care.  She was accompanied by her daughter No new complaints.  She has a history of anemia secondary to chronic kidney disease and has been receiving iron treatments.  She has completed two doses and is scheduled for a third dose today. She tolerates the treatments well, currently taking Hemocyte Plus as an iron supplement.  She experiences a sudden onset of fatigue and weakness that began last week. She describes feeling like her 'old self' with low energy and difficulty walking, requiring the use of a cane. These symptoms had previously improved but have now returned, impacting her ability to attend rehabilitation sessions. She mentions a recent lapse in her sleep medication, clonazepam, which she was without for three to four days, potentially contributing to her fatigue. She has since resumed the  medication.    Review of Systems  Constitutional:  Positive for fatigue. Negative for appetite change, chills and fever.  HENT:   Negative for hearing loss and voice change.   Eyes:  Negative for eye problems.  Respiratory:  Negative for chest tightness and cough.   Cardiovascular:  Negative for chest pain.  Gastrointestinal:  Negative for abdominal distention, abdominal pain and blood in stool.  Endocrine: Negative for hot flashes.  Genitourinary:  Negative for difficulty urinating and frequency.   Musculoskeletal:  Positive for arthralgias.  Skin:  Negative for itching and rash.  Neurological:  Negative for extremity weakness.  Hematological:  Negative for adenopathy.  Psychiatric/Behavioral:  Negative for confusion.     MEDICAL HISTORY:  Past Medical History:  Diagnosis Date   Abnormal electrocardiogram 11/25/2013   Formatting of this note might be different from the original. Last Assessment & Plan: Resolved when went to the ER.  Will f/u with Dr. Welton Flakes Note: Unchanged Formatting of this note might be different from the original. Note: Unchanged Formatting of this note might be different from the original.  Formatting of this note might be different from the original. Last Assessment & Plan: Resolved when we   Allergy    Anemia    Anxiety    Bilateral hand pain 07/25/2018   Cerebral infarction, unspecified (HCC) 01/26/2015   Chest pain 08/09/2016   Chest pain, rule out acute myocardial infarction 08/09/2016   Chronic diastolic heart failure (HCC)    COPD (chronic obstructive pulmonary disease) (HCC)    Depression    Diabetes (HCC)    Diarrhea, functional 01/25/2016   Edema of lower extremity 02/03/2020   Elevated troponin level 01/09/2020   Enlarged RV (right ventricle)    GERD (gastroesophageal reflux disease)    Hepatic steatosis    History of stroke 08/26/2022   Formatting of this note might be different from the original. Last Assessment & Plan:  Formatting of this  note might be different from the original.  Continue ASA, statin     Hyperlipidemia    Hypertension    Hypokalemia 12/22/2015   Hypomagnesemia 03/22/2016   Insomnia    Macrocytosis 01/25/2016   Migraines    Nutmeg liver    Osteoporosis    Palpitations 11/25/2013   Formatting of this note might be different from the original. Note: Unchanged   Pulmonary hypertension (HCC)    [facility] spotted fever    Severe pulmonary arterial systolic hypertension (HCC)    Severe tricuspid regurgitation by prior echocardiogram    Sleep apnea    CPAP   Transient cerebral ischemia 2016   Type 2 diabetes mellitus with hyperglycemia (HCC) 02/14/2012   Formatting of this note might be different from the original. Last Assessment & Plan: Hgb A1C is 7.1%.  Don't make any changes now.  Will hopefully increase exercise when pulmonary hypertension is properly  treated.  ? Pulmonary rehab Note: Unchanged Formatting of this note might be different from the original. Formatting of this note might be different from the original.  Formatting of this note    Vertigo    every 2-3 months    SURGICAL HISTORY: Past Surgical History:  Procedure Laterality Date   ABDOMINAL HYSTERECTOMY     CORONARY ANGIOPLASTY     ESOPHAGOGASTRODUODENOSCOPY (EGD) WITH PROPOFOL N/A 08/24/2015   Procedure: ESOPHAGOGASTRODUODENOSCOPY (EGD) WITH PROPOFOL with dialation;  Surgeon: Midge Minium, MD;  Location: [facility];  Service: Endoscopy;  Laterality: N/A;  Diabetic - oral meds   RIGHT/LEFT HEART CATH AND CORONARY/GRAFT ANGIOGRAPHY N/A 01/13/2020   Procedure: RIGHT/LEFT HEART CATH AND CORONARY/GRAFT ANGIOGRAPHY;  Surgeon: Iran Ouch, MD;  Location: [facility] INVASIVE CV LAB;  Service: Cardiovascular;  Laterality: N/A;   TUBAL LIGATION      SOCIAL HISTORY: Social History   Socioeconomic History   Marital status: Widowed    Spouse name: Chanetta Marshall   Number of children: 3   Years of education: 14   Highest education level:  Not on file  Occupational History    Comment: work part time, day care   Occupation: Retired  Tobacco Use   Smoking status: Never   Smokeless tobacco: Never  Vaping Use   Vaping status: Never Used  Substance and Sexual Activity   Alcohol use: No    Alcohol/week: 0.0 standard drinks of alcohol   Drug use: No   Sexual activity: Not Currently    Birth control/protection: None, Post-menopausal  Other Topics Concern   Not on file  Social History Narrative   Caffeine use- coffee 2 cups daily, Coke once a week, green tea daily   Social Drivers of Corporate investment banker Strain: Not on file  Food Insecurity: No Food Insecurity (05/15/2023)   Hunger Vital Sign    Worried About Running Out of Food in the Last Year: Never true    Ran Out of Food in the Last Year: Never true  Transportation Needs: No Transportation Needs (05/15/2023)   PRAPARE - Administrator, Civil Service (Medical): No    Lack of Transportation (Non-Medical): No  Physical Activity: Not on file  Stress: Not on file  Social Connections: Not on file  Intimate Partner Violence: Not At Risk (05/15/2023)   Humiliation, Afraid, Rape, and Kick questionnaire    Fear of Current or Ex-Partner: No    Emotionally Abused: No    Physically Abused: No    Sexually Abused: No    FAMILY HISTORY: Family History  Problem Relation Age of Onset   Diabetes Mother    Hypertension Mother    Stroke Mother    Heart disease Mother        MI   Cancer Father        pancreatic   Stroke Father    Hypertension Sister    Diabetes Brother    Diabetes Maternal Grandfather    Diabetes Sister    Diabetes Sister    Diabetes Brother    Diabetes Brother     ALLERGIES:  is allergic to atorvastatin, fluoxetine, levemir [insulin detemir], and prozac [fluoxetine hcl].  MEDICATIONS:  Current Outpatient Medications  Medication Sig Dispense Refill   ACCU-CHEK GUIDE test strip 3 each by Other route 3 (three) times daily as needed  for other (low blood sugar symptoms). 100 each 6   ambrisentan (LETAIRIS) 10 MG tablet TAKE 1 TABLET BY MOUTH EVERY DAY 30 tablet  3   amLODipine (NORVASC) 10 MG tablet Take 1 tablet (10 mg total) by mouth daily. 90 tablet 3   aspirin 81 MG tablet Take 81 mg by mouth daily.     Cholecalciferol (VITAMIN D3) 125 MCG (5000 UT) CAPS Take 1 capsule by mouth daily.     clonazePAM (KLONOPIN) 1 MG tablet TAKE 1 TABLET BY MOUTH UP TO TWICE DAILY AS NEEDED FOR ANXIETY 60 tablet 1   dapagliflozin propanediol (FARXIGA) 10 MG TABS tablet Take 1 tablet (10 mg total) by mouth daily before breakfast. 90 tablet 3   diphenoxylate-atropine (LOMOTIL) 2.5-0.025 MG tablet Take 1 tablet by mouth 4 (four) times daily as needed for diarrhea or loose stools. 120 tablet 2   escitalopram (LEXAPRO) 10 MG tablet TAKE 1 TABLET BY MOUTH EVERY MORNING 90 tablet 1   esomeprazole (NEXIUM) 40 MG capsule Take 1 capsule (40 mg total) by mouth daily. 90 capsule 1   Fe Fum-FA-B Cmp-C-Zn-Mg-Mn-Cu (HEMOCYTE PLUS) 106-1 MG CAPS Take 1 capsule by mouth daily. 90 capsule 1   furosemide (LASIX) 20 MG tablet Take 2 tablets (40 mg total) by mouth 2 (two) times daily. 180 tablet 1   insulin aspart (NOVOLOG) 100 UNIT/ML injection Inject 4 Units into the skin. Bs greater than 200     Insulin Degludec (TRESIBA [place]) Inject 14 Units into the skin at bedtime.     Insulin Pen Needle (B-D UF III MINI PEN NEEDLES) 31G X 5 MM MISC 1 Needle by Other route 2 (two) times daily. 100 each 5   levothyroxine (SYNTHROID) 75 MCG tablet TAKE 1 TABLET BY MOUTH EVERY MORNING ON AN EMPTY STOMACH 90 tablet 1   loratadine (CLARITIN) 10 MG tablet Take 10 mg by mouth daily.      meclizine (ANTIVERT) 25 MG tablet Take 25 mg by mouth 3 (three) times daily as needed for dizziness.      metoprolol succinate (TOPROL-XL) 25 MG 24 hr tablet Take 1 tablet (25 mg total) by mouth daily. 90 tablet 3   Multiple Vitamins-Minerals (MULTI ADULT GUMMIES) CHEW Chew by mouth daily.      ORENITRAM 0.25 MG TBCR Take by mouth.     ORENITRAM 1 MG TBCR Take by mouth.     ORENITRAM MONTH 3 0.125 & 0.25 &1 MG TEPK Take 3 tablets by mouth 3 (three) times daily.     potassium chloride (KLOR-CON M) 10 MEQ tablet Take 1 tablet (10 mEq total) by mouth daily. 30 tablet 0   rosuvastatin (CRESTOR) 10 MG tablet Take 10 mg by mouth daily.     tadalafil, PAH, (ADCIRCA) 20 MG tablet Take 2 tablets (40 mg total) by mouth daily. 60 tablet 11   Treprostinil Diolamine ER (ORENITRAM) 0.125 MG TBCR Take 1 tablet by mouth 3 (three) times daily.     No current facility-administered medications for this visit.     PHYSICAL EXAMINATION: ECOG PERFORMANCE STATUS: 1 - Symptomatic but completely ambulatory Vitals:   07/20/23 1342 07/20/23 1359  BP: (!) 140/98 124/83  Pulse: 87   Resp: 18   Temp: (!) 96 F (35.6 C)   SpO2: 98%    Filed Weights   07/20/23 1342  Weight: 156 lb 6.4 oz (70.9 kg)     Physical Exam Constitutional:      General: She is not in acute distress. HENT:     Head: Normocephalic and atraumatic.  Eyes:     General: No scleral icterus. Cardiovascular:     Rate and Rhythm:  Normal rate and regular rhythm.     Heart sounds: Normal heart sounds.  Pulmonary:     Effort: Pulmonary effort is normal. No respiratory distress.     Breath sounds: No wheezing.  Abdominal:     General: Bowel sounds are normal. There is no distension.     Palpations: Abdomen is soft.  Musculoskeletal:        General: No deformity. Normal range of motion.     Cervical back: Normal range of motion and neck supple.  Skin:    General: Skin is warm and dry.     Findings: No erythema or rash.  Neurological:     Mental Status: She is alert and oriented to person, place, and time. Mental status is at baseline.     Cranial Nerves: No cranial nerve deficit.     Coordination: Coordination normal.  Psychiatric:        Mood and Affect: Mood normal.     LABORATORY DATA:  I have reviewed the data as  listed Lab Results  Component Value Date   WBC 3.9 (L) 06/29/2023   HGB 10.4 (L) 06/29/2023   HCT 32.8 (L) 06/29/2023   MCV 87.9 06/29/2023   PLT 151 06/29/2023   Recent Labs    10/20/22 1207 01/18/23 1156 01/20/23 1136 04/26/23 1211 06/29/23 1514  NA 141 141 142 139 140  K 4.4 3.8 4.0 3.7 4.0  CL 104 110 105 106 108  CO2 21 24 22 24 23   GLUCOSE 129* 105* 75 117* 164*  BUN 22 15 18 16  26*  CREATININE 1.70* 1.49* 1.62* 1.43* 1.39*  CALCIUM 9.4 9.1 9.8 9.6 9.1  GFRNONAA  --  36*  --  38* 39*  PROT 6.7  --  6.5  --  7.7  ALBUMIN 4.1  --  4.2  --  4.1  AST 19  --  14  --  18  ALT 10  --  10  --  12  ALKPHOS 74  --  71  --  64  BILITOT 0.3  --  0.3  --  0.6   Iron/TIBC/Ferritin/ %Sat    Component Value Date/Time   IRON 28 02/26/2021 1313   IRON 38 07/14/2016 1140   TIBC 287 02/26/2021 1313   TIBC 302 07/14/2016 1140   FERRITIN 28 02/26/2021 1313   FERRITIN 32 07/14/2016 1140   IRONPCTSAT 10 (L) 02/26/2021 1313   IRONPCTSAT 13 (L) 07/14/2016 1140      RADIOGRAPHIC STUDIES: I have personally reviewed the radiological images as listed and agreed with the findings in the report. DG Bone Survey Met Result Date: 07/08/2023 CLINICAL DATA:  Osteoporosis. EXAM: METASTATIC BONE SURVEY COMPARISON:  09/08/2022 chest radiograph. FINDINGS: No fracture or focal osseous lesions in the ribs, clavicles, shoulders, spine, calvarium, pelvis, hips or extremity long bones. Mild S-shaped thoracolumbar curvature. Mild lumbar spondylosis. IMPRESSION: 1. No fracture or focal osseous lesions. 2. Mild S-shaped thoracolumbar curvature. Mild lumbar spondylosis. Electronically Signed   By: Delbert Phenix M.D.   On: 07/08/2023 21:39

## 2023-07-20 NOTE — Assessment & Plan Note (Addendum)
Recent Labs were reviewed and discussed with patient. Lab Results  Component Value Date   HGB 10.4 (L) 06/29/2023   TIBC 287 02/26/2021   IRONPCTSAT 10 (L) 02/26/2021   FERRITIN 28 02/26/2021    Anemia is most likely due to CKD stage 3b Finish 3 weekly dose of Venofer. HB is stable.

## 2023-07-24 ENCOUNTER — Encounter: Payer: Medicare PPO | Admitting: *Deleted

## 2023-07-24 DIAGNOSIS — I272 Pulmonary hypertension, unspecified: Secondary | ICD-10-CM | POA: Diagnosis not present

## 2023-07-24 NOTE — Progress Notes (Signed)
Daily Session Note  Patient Details  Name: Jamie Murray MRN: [id number] Date of Birth: [birth year] Referring Provider:   Flowsheet Row Pulmonary Rehab from 05/24/2023 in [facility]  Referring Provider Dr. Marca Ancona       Encounter Date: 07/24/2023  Check In:  Session Check In - 07/24/23 1409       Check-In   Supervising physician immediately available to respond to emergencies See telemetry face sheet for immediately available ER MD    Location [facility]-Cardiac & Pulmonary Rehab    Staff Present Susann Givens RN,BSN;Joseph [facility] BS, Exercise Physiologist;Noah Tickle, BS, Exercise Physiologist    Virtual Visit No    Medication changes reported     No    Fall or balance concerns reported    No    Warm-up and Cool-down Performed on first and last piece of equipment    Resistance Training Performed Yes    VAD Patient? No    PAD/SET Patient? No      Pain Assessment   Currently in Pain? No/denies                Social History   Tobacco Use  Smoking Status Never  Smokeless Tobacco Never    Goals Met:  Independence with exercise equipment Exercise tolerated well No report of concerns or symptoms today Strength training completed today  Goals Unmet:  Not Applicable  Comments: Pt able to follow exercise prescription today without complaint.  Will continue to monitor for progression.    Dr. Bethann Punches is Medical Director for [facility].  Dr. Vida Rigger is Medical Director for [facility].

## 2023-07-25 ENCOUNTER — Other Ambulatory Visit (HOSPITAL_COMMUNITY): Payer: Self-pay

## 2023-07-25 ENCOUNTER — Telehealth (HOSPITAL_COMMUNITY): Payer: Self-pay | Admitting: Pharmacy Technician

## 2023-07-25 ENCOUNTER — Telehealth (HOSPITAL_COMMUNITY): Payer: Self-pay | Admitting: Pharmacist

## 2023-07-25 ENCOUNTER — Telehealth: Payer: Self-pay | Admitting: *Deleted

## 2023-07-25 NOTE — Telephone Encounter (Signed)
Pharmacy Patient Advocate Encounter  Insurance verification completed.   The patient is insured through [employer] test claim for Orenitram 0.25mg . Currently a quantity of 162 is a 30 day supply and the co-pay is $0 .  This test claim was processed through [facility]- copay amounts may vary at other pharmacies due to pharmacy/plan contracts, or as the patient moves through the different stages of their insurance plan.   Archer Asa, CPhT

## 2023-07-25 NOTE — Telephone Encounter (Signed)
Patient Advocate Encounter   Received notification from [facility] that prior authorization for Jamie Murray is required.   PA submitted on CoverMyMeds Key [id number] Status is pending   Will continue to follow.  Karle Plumber, PharmD, BCPS, BCCP, CPP Heart Failure Clinic Pharmacist [phone]

## 2023-07-25 NOTE — Telephone Encounter (Signed)
Advanced Heart Failure Patient Advocate Encounter  Prior Authorization for Marylou Flesher has been approved.    PA# [id number] Effective dates: 06/07/23 through 06/05/24  Karle Plumber, PharmD, BCPS, BCCP, CPP Heart Failure Clinic Pharmacist [phone]

## 2023-07-25 NOTE — Telephone Encounter (Signed)
The patient would like to get it sent in the mail. I asked ashley to send it in the mail and I called  the pt. And told her the appts will be in the mail and it takes 4-5 days to get there and the first appt 01/16/2024 at 2 pm she is ok with this

## 2023-07-26 DIAGNOSIS — F4321 Adjustment disorder with depressed mood: Secondary | ICD-10-CM | POA: Diagnosis not present

## 2023-08-04 DIAGNOSIS — G4733 Obstructive sleep apnea (adult) (pediatric): Secondary | ICD-10-CM | POA: Diagnosis not present

## 2023-08-07 ENCOUNTER — Telehealth: Payer: Self-pay | Admitting: Cardiology

## 2023-08-07 NOTE — Telephone Encounter (Signed)
 Pt confirmed appt for 08/08/23

## 2023-08-08 ENCOUNTER — Ambulatory Visit: Payer: Medicare PPO | Attending: Cardiology | Admitting: Cardiology

## 2023-08-08 ENCOUNTER — Other Ambulatory Visit: Payer: Self-pay

## 2023-08-08 VITALS — BP 131/87 | HR 81 | Wt 154.0 lb

## 2023-08-08 DIAGNOSIS — R9431 Abnormal electrocardiogram [ECG] [EKG]: Secondary | ICD-10-CM | POA: Diagnosis not present

## 2023-08-08 DIAGNOSIS — Z794 Long term (current) use of insulin: Secondary | ICD-10-CM | POA: Insufficient documentation

## 2023-08-08 DIAGNOSIS — I272 Pulmonary hypertension, unspecified: Secondary | ICD-10-CM | POA: Diagnosis not present

## 2023-08-08 DIAGNOSIS — I10 Essential (primary) hypertension: Secondary | ICD-10-CM | POA: Diagnosis present

## 2023-08-08 DIAGNOSIS — E119 Type 2 diabetes mellitus without complications: Secondary | ICD-10-CM | POA: Insufficient documentation

## 2023-08-08 DIAGNOSIS — D472 Monoclonal gammopathy: Secondary | ICD-10-CM | POA: Insufficient documentation

## 2023-08-08 DIAGNOSIS — I5032 Chronic diastolic (congestive) heart failure: Secondary | ICD-10-CM | POA: Insufficient documentation

## 2023-08-08 DIAGNOSIS — R5383 Other fatigue: Secondary | ICD-10-CM | POA: Diagnosis not present

## 2023-08-08 DIAGNOSIS — I371 Nonrheumatic pulmonary valve insufficiency: Secondary | ICD-10-CM | POA: Insufficient documentation

## 2023-08-08 DIAGNOSIS — I50810 Right heart failure, unspecified: Secondary | ICD-10-CM | POA: Diagnosis not present

## 2023-08-08 DIAGNOSIS — I110 Hypertensive heart disease with heart failure: Secondary | ICD-10-CM | POA: Insufficient documentation

## 2023-08-08 DIAGNOSIS — G4733 Obstructive sleep apnea (adult) (pediatric): Secondary | ICD-10-CM | POA: Insufficient documentation

## 2023-08-08 DIAGNOSIS — Z79899 Other long term (current) drug therapy: Secondary | ICD-10-CM | POA: Insufficient documentation

## 2023-08-08 DIAGNOSIS — K7460 Unspecified cirrhosis of liver: Secondary | ICD-10-CM | POA: Insufficient documentation

## 2023-08-08 MED ORDER — POTASSIUM CHLORIDE CRYS ER 20 MEQ PO TBCR: 20.0000 meq | EXTENDED_RELEASE_TABLET | Freq: Every day | ORAL | 3 refills | Status: DC

## 2023-08-08 MED ORDER — FUROSEMIDE 20 MG PO TABS: 60.0000 mg | ORAL_TABLET | Freq: Two times a day (BID) | ORAL | 3 refills | Status: DC

## 2023-08-08 NOTE — Patient Instructions (Addendum)
 Medication Changes:  INREASE Furosemide 60mg  ( 3 tabs) two times daily  INCREASE Potassium to (1 tab) daily  Lab Work:  Go DOWN to LOWER LEVEL (LL) to have your blood work completed inside of [employer] office.  We will only call you if the results are abnormal or if the provider would like to make medication changes.    Follow-Up in: Our Doctors' schedules are NOT open yet for 2 months. We will place you on our recall list. Once they are available, we will call you to schedule your follow up appointment.   At the [facility], you and your health needs are our priority. We have a designated team specialized in the treatment of Heart Failure. This Care Team includes your primary Heart Failure Specialized Cardiologist (physician), Advanced Practice Providers (APPs- Physician Assistants and Nurse Practitioners), and Pharmacist who all work together to provide you with the care you need, when you need it.   You may see any of the following providers on your designated Care Team at your next follow up:  Dr. Arvilla Meres Dr. Marca Ancona Dr. Dorthula Nettles Dr. Theresia Bough Tonye Becket, NP Robbie Lis, [place] [facility] [place], [place] Brynda Peon, NP [place] Lee, NP Karle Plumber, PharmD   Please be sure to bring in all your medications bottles to every appointment.   Need to Contact us:  If you have any questions or concerns before your next appointment please send [place] a message through [employer] or call our office at [phone].    TO LEAVE A MESSAGE FOR THE NURSE SELECT OPTION 2, PLEASE LEAVE A MESSAGE INCLUDING: YOUR NAME DATE OF BIRTH CALL BACK NUMBER REASON FOR CALL**this is important as we prioritize the call backs  YOU WILL RECEIVE A CALL BACK THE SAME DAY AS LONG AS YOU CALL BEFORE 4:00 PM   Jamie Murray  08/08/2023  You are scheduled for a Cardiac Catheterization on Friday, March 21 with Dr. Marca Ancona.  1. Please  arrive at the [facility] (Main Entrance A) at [facility]: [address] [place], [place] [address] at 7:00 AM (This time is 2 hour(s) before your procedure to ensure your preparation).   Free valet parking service is available. You will check in at ADMITTING. The support person will be asked to wait in the waiting room.  It is OK to have someone drop you off and come back when you are ready to be discharged.    Special note: Every effort is made to have your procedure done on time. Please understand that emergencies sometimes delay scheduled procedures.  2. Diet: Do not eat solid foods after midnight.  The patient may have clear liquids until 5am upon the day of the procedure.  3. Labs: You will need to have blood drawn today.  4. Medication instructions in preparation for your procedure:   Contrast Allergy: No  DO NOT TAKE Farxiga or Fursemide the day of the procedure  TAKE HALF DOSE of your Tresiba (7 units) THE NIGHT BEFORE. DO NOT TAKE any insulin the morning of the procedure.     On the morning of your procedure, take your Aspirin 81 mg and any morning medicines NOT listed above.  You may use sips of water.  5. Plan to go home the same day, you will only stay overnight if medically necessary. 6. Bring a current list of your medications and current insurance cards. 7. You MUST have a responsible person to drive  you home. 8. Someone MUST be with you the first 24 hours after you arrive home or your discharge will be delayed. 9. Please wear clothes that are easy to get on and off and wear slip-on shoes.  Thank you for allowing [place] to care for you!   -- [facility]

## 2023-08-08 NOTE — H&P (View-Only) (Signed)
 PCP: Jamie Kins, FNP HF Cardiology: Dr. Shirlee Murray  78 y.o. with history of HTN, type 2 diabetes, OSA on CPAP, and pulmonary hypertension/RV failure was referred by Dr. Kirke Murray for evaluation of pulmonary hypertension. It appears that PH was diagnosed as early as 2018 by echo.  Echo in 2020 showed PA systolic pressure 85 mmHg.  She says that she was told that the pulmonary hypertension was due to OSA.  She has been compliant with CPAP. She reports exertional dyspnea for about 4 years now.   In 8/21, she was admitted to [facility] with CHF.  Echo showed EF 55-60%, mild LVH, D-shaped septum, severe RV dilation with moderate RV systolic dysfunction, PASP 83, moderate TR, moderate PI, dilated IVC.  RHC/LHC showed no coronary disease, severe pulmonary hypertension with PVR 7.38 and preserved cardiac output.  High resolution CT chest in 9/21 showed no evidence for fibrotic ILD.    She saw rheumatology and was given no definite rheumatological diagnosis.    Echo in 7/22 showed EF 65-70% with moderate LVH, RV moderately dilated with moderately decreased RV systolic function, mild RVH, D-shaped septum, PASP 85 mmHg, moderate PR, normal IVC.  Echo in 9/23 showed EF 55-60%, D-shaped septum, severe RV dilation with severe RV dysfunction, mild TR, PASP 50 mmHg.   She is off Uptravi, unable to tolerate even the lowest dose due to diarrhea and headache.  She tried Tyvaso DPI; she developed headaches and did not like inhaling and found the device hard to manipulate so she stopped it.  She was not interested in trying the inhaler form of Tyvaso.    Admitted 3/24 CHF/RV failure, no improvement after increase in Lasix. Diuresed with IV lasix, eventually transitioned to po Lasix 40 bid and discharged home, weight 168.5 lbs. Seen in ED 09/09/22 with dyspnea. BNP >2k, but felt she was stable for discharge, with close HF follow up.   Echo in 11/24 showed EF 60-65%, moderate LVH, mildly D-shaped interventricular septum, mild RV  enlargement and mildly decreased systolic function, unable to estimate PA systolic pressure.   Today she returns for RV failure/pulmonary hypertension follow up. She had been doing well at last appointment but feels substantially worse today.  Weight is actually down 3 lbs.  However, she reports a high sodium diet and has noted increased dyspnea. She has to stop to rest several times walking into church.  She is short of breath walking unless she goes very slowly. Energy level is poor.  She has felt too tired to go to pulmonary rehab.  She has had a lot of anxiety about recent MGUS diagnosis. She has had chronic loose stool, usually has a couple of episodes in the morning.  This does not really seem to be affected by uptitration of orenitram. She is up to 3.25 mg tid orenitram.    ECG (personally reviewed): NSR, iRBBB, RVH, inferolateral TWIs  Labs (8/21): K 3.3, creatinine 1.39, ANA negative, anti-Sm negative, anti-SCL-70 negative, ANCA negative, RF elevated 52 but CCP negative, ESR 67, CRP 2.2 Labs (9/21): K 4.5, creatinine 1.8 => 1.45, anti-centromere Ab negative, BNP 309 Labs (11/21): K 4.1, creatinine 1.47, BNP 590 Labs (1/22): LDL 55, K 3.8, creatinine 1.61 Labs (5/22): LDL 74, K 4, creatinine 1.5 Labs (9/22): K 3.8, creatinine 1.43 Labs (12/22): LDL 54, K 3.8, creatinine 1.3 Labs (2/23): BNP 197, K 3.4, creatinine 1.3 Labs (6/23): BNP 862 Labs (9/23): K 4, creatinine 1.87 Labs (2/24): K 3.9, creatinine 1.39, LFTs normal Labs (4/24): K 3.5,  creatinine 1.65 Labs (5/24): LDL 75 Labs (8/24): K 3.8, creatinine 1.27, hgb 9.3, LDL 50 Labs (9/24): K 4.4, creatinine 1.3 Labs (1/25): K 4, creatinine 1.39  6 minute walk (8/21): 154 m 6 minute walk (11/21): 183 m 6 minute walk (3/22): 351 m 6 minute walk (11/22): 381 m 6 minute walk (2/23): 290 m (off Uptravi) 6 minute walk (8/24): 335 m  PMH: 1. HTN 2. Type 2 diabetes 3. OSA: Uses CPAP.  4. GERD 5. Hypothyroidism 6. Pulmonary  hypertension/RV failure: Pulmonary hypertension diagnosed 2018 by echo.  - PFTs (6/18): moderate restriction - Echo 2020 with PASP 85 mmHg.  - Echo (8/21): EF 55-60%, mild LVH, D-shaped septum, severe RV dilation with moderate RV systolic dysfunction, PASP 83, moderate TR, moderate PI, dilated IVC.  - LHC/RHC (8/21): Normal coronaries; mean RA 3, PA 82/21 mean 44, mean PCWP 14, CI 2.2, PVR 7.38 WU.  - V/Q scan (8/21): No evidence for chronic PE.  - Serologic workup: ANA negative, anti-Sm negative, anti-SCL-70 negative, ANCA negative, RF elevated 52 but CCP negative, ESR 67, CRP 2.2, anti-centromere negative - High resolution CT chest: No fibrotic ILD.  - Echo (7/22): EF 65-70% with moderate LVH, RV moderately dilated with moderately decreased RV systolic function, mild RVH, D-shaped septum, PASP 85 mmHg, moderate PR, normal IVC.  - Echo (9/23): EF 55-60%, D-shaped septum, severe RV dilation with severe RV dysfunction, mild TR, PASP 50 mmHg. - Echo (11/24): EF 60-65%, moderate LVH, mildly D-shaped interventricular septum, mild RV enlargement and mildly decreased systolic function, unable to estimate PA systolic pressure.  7. CKD stage 3.  8. Cirrhosis: Noted on 9/23 CT abdomen/pelvis. Suspect cardiogenic.   Social History   Socioeconomic History   Marital status: Widowed    Spouse name: Jamie Murray   Number of children: 3   Years of education: 14   Highest education level: Not on file  Occupational History    Comment: work part time, day care   Occupation: Retired  Tobacco Use   Smoking status: Never   Smokeless tobacco: Never  Vaping Use   Vaping status: Never Used  Substance and Sexual Activity   Alcohol use: No    Alcohol/week: 0.0 standard drinks of alcohol   Drug use: No   Sexual activity: Not Currently    Birth control/protection: None, Post-menopausal  Other Topics Concern   Not on file  Social History Narrative   Caffeine use- coffee 2 cups daily, Coke once a week, green tea  daily   Social Drivers of Corporate investment banker Strain: Not on file  Food Insecurity: No Food Insecurity (05/15/2023)   Hunger Vital Sign    Worried About Running Out of Food in the Last Year: Never true    Ran Out of Food in the Last Year: Never true  Transportation Needs: No Transportation Needs (05/15/2023)   PRAPARE - Administrator, Civil Service (Medical): No    Lack of Transportation (Non-Medical): No  Physical Activity: Not on file  Stress: Not on file  Social Connections: Not on file  Intimate Partner Violence: Not At Risk (05/15/2023)   Humiliation, Afraid, Rape, and Kick questionnaire    Fear of Current or Ex-Partner: No    Emotionally Abused: No    Physically Abused: No    Sexually Abused: No   Family History  Problem Relation Age of Onset   Diabetes Mother    Hypertension Mother    Stroke Mother    Heart disease  Mother        MI   Cancer Father        pancreatic   Stroke Father    Hypertension Sister    Diabetes Brother    Diabetes Maternal Grandfather    Diabetes Sister    Diabetes Sister    Diabetes Brother    Diabetes Brother    ROS: All systems reviewed and negative except as per HPI.   Current Outpatient Medications  Medication Sig Dispense Refill   ACCU-CHEK GUIDE test strip 3 each by Other route 3 (three) times daily as needed for other (low blood sugar symptoms). 100 each 6   ambrisentan (LETAIRIS) 10 MG tablet TAKE 1 TABLET BY MOUTH EVERY DAY 30 tablet 3   amLODipine (NORVASC) 10 MG tablet Take 1 tablet (10 mg total) by mouth daily. 90 tablet 3   aspirin 81 MG tablet Take 81 mg by mouth daily.     Cholecalciferol (VITAMIN D3) 125 MCG (5000 UT) CAPS Take 1 capsule by mouth daily.     clonazePAM (KLONOPIN) 1 MG tablet TAKE 1 TABLET BY MOUTH UP TO TWICE DAILY AS NEEDED FOR ANXIETY 60 tablet 1   dapagliflozin propanediol (FARXIGA) 10 MG TABS tablet Take 1 tablet (10 mg total) by mouth daily before breakfast. 90 tablet 3    diphenoxylate-atropine (LOMOTIL) 2.5-0.025 MG tablet Take 1 tablet by mouth 4 (four) times daily as needed for diarrhea or loose stools. 120 tablet 2   escitalopram (LEXAPRO) 10 MG tablet TAKE 1 TABLET BY MOUTH EVERY MORNING 90 tablet 1   esomeprazole (NEXIUM) 40 MG capsule Take 1 capsule (40 mg total) by mouth daily. 90 capsule 1   Fe Fum-FA-B Cmp-C-Zn-Mg-Mn-Cu (HEMOCYTE PLUS) 106-1 MG CAPS Take 1 capsule by mouth daily. 90 capsule 1   insulin aspart (NOVOLOG) 100 UNIT/ML injection Inject 4 Units into the skin. Bs greater than 200     Insulin Degludec (TRESIBA [place]) Inject 14 Units into the skin at bedtime.     Insulin Pen Needle (B-D UF III MINI PEN NEEDLES) 31G X 5 MM MISC 1 Needle by Other route 2 (two) times daily. 100 each 5   levothyroxine (SYNTHROID) 75 MCG tablet TAKE 1 TABLET BY MOUTH EVERY MORNING ON AN EMPTY STOMACH 90 tablet 1   loratadine (CLARITIN) 10 MG tablet Take 10 mg by mouth daily.      meclizine (ANTIVERT) 25 MG tablet Take 25 mg by mouth 3 (three) times daily as needed for dizziness.      metoprolol succinate (TOPROL-XL) 25 MG 24 hr tablet Take 1 tablet (25 mg total) by mouth daily. 90 tablet 3   Multiple Vitamins-Minerals (MULTI ADULT GUMMIES) CHEW Chew by mouth daily.     ORENITRAM 0.25 MG TBCR Take by mouth.     ORENITRAM 1 MG TBCR Take by mouth.     ORENITRAM MONTH 3 0.125 & 0.25 &1 MG TEPK Take 3 tablets by mouth 3 (three) times daily.     rosuvastatin (CRESTOR) 10 MG tablet Take 10 mg by mouth daily.     tadalafil, PAH, (ADCIRCA) 20 MG tablet Take 2 tablets (40 mg total) by mouth daily. 60 tablet 11   Treprostinil Diolamine ER (ORENITRAM) 0.125 MG TBCR Take 1 tablet by mouth 3 (three) times daily.     furosemide (LASIX) 20 MG tablet Take 3 tablets (60 mg total) by mouth 2 (two) times daily. 90 tablet 3   potassium chloride (KLOR-CON M) 20 MEQ tablet Take 1 tablet (20  mEq total) by mouth daily. 30 tablet 3   No current facility-administered medications for this visit.    Wt Readings from Last 3 Encounters:  08/08/23 154 lb (69.9 kg)  07/20/23 156 lb 6.4 oz (70.9 kg)  07/14/23 155 lb 12.8 oz (70.7 kg)   BP 131/87   Pulse 81   Wt 154 lb (69.9 kg)   LMP  (LMP Unknown)   SpO2 100%   BMI 24.12 kg/m  General: NAD Neck: JVP 8-9 cm with HJR, no thyromegaly or thyroid nodule.  Lungs: Clear to auscultation bilaterally with normal respiratory effort. CV: Nondisplaced PMI.  Heart regular S1/S2 with wide S2 split, no S3/S4, no murmur.  No peripheral edema.  No carotid bruit.  Normal pedal pulses.  Abdomen: Soft, nontender, no hepatosplenomegaly, no distention.  Skin: Intact without lesions or rashes.  Neurologic: Alert and oriented x 3.  Psych: Normal affect. Extremities: No clubbing or cyanosis.  HEENT: Normal.   Assessment/Plan: 1. HTN: BP controlled.   - Continue amlodipine.  2. OSA: Continue CPAP.    3. Pulmonary HTN/RV failure: Echo in 8/21 with EF 55-60%, mild LVH, D-shaped septum, severe RV dilation with moderate RV systolic dysfunction, PASP 83, moderate TR, moderate PI, dilated IVC.  Suspect long-standing RV dysfunction based on appearance of echo. RHC in 8/21 with severe pulmonary arterial hypertension, preserved cardiac output. PFTs from 6/18 showed moderate restriction.  V/Q scan from 8/21 showed no evidence for chronic PE. She has treated OSA.  Serologic workup was negative except for elevated CRP and ESR and elevated RF (normal CCP).  High resolution CT chest did not show evidence for ILD. I suspect long-standing group 1 PAH. Repeat echo in 7/22 showed EF 65-70% with moderate LVH, RV moderately dilated with moderately decreased RV systolic function, mild RVH, D-shaped septum, PASP 85 mmHg, moderate PR, normal IVC. Though echo still showed significant RV dysfunction, she symptomatically has felt better on PH meds.  She had to stop Uptravi due to intractable diarrhea and headache.  She was unable to use Tyvaso.  Echo in 9/23 showed EF 55-60%, D-shaped  septum, severe RV dilation with severe RV dysfunction, mild TR, PASP 50 mmHg. Echo in 11/24 showed improved RV; EF 60-65%, moderate LVH, mildly D-shaped interventricular septum, mild RV enlargement and mildly decreased systolic function, unable to estimate PA systolic pressure.  She is doing worse today than at last appointment.  She looks volume overloaded on exam and reports a high sodium diet.  NYHA class III. - Cut back sodium in diet.   - Increase Lasix to 60 mg bid. Increase KCl to 20 daily. BMET and BNP today, BMET in 10 days.  . - Continue Farxiga 10 mg daily.  - Continue ambrisentan 10.  - Continue tadalafil 40 mg daily.   - Continue uptitration of orenitram, aim for 4 mg tid gradually.  She is currently on 3.25 mg tid.  If she gets to 4 mg tid with minimal symptoms, can increase to 6 mg tid.  - Consider sotatercept as next step after full orenitram titration.  - Continue CPAP (OSA likely contributes but certainly cannot explain the extent of her PH).  - Worsening symptoms recently with volume overload.  I am going to plan for RHC after increased diuresis.  We discussed risks/benefits and she agrees to procedure.  4. Pulmonary insufficiency: Moderate on 9/23 echo, only mild on echo in 11/24.   5. Cirrhosis: Noted on imaging. Most likely due  to RV failure and passive  congestion, possible NAFLD.  6. Fatigue: Check CBC, TSH today.   Followup in 2 months.   I spent 42 minutes reviewing records, interviewing/examining patient, and managing orders. Jamie Murray  08/08/2023

## 2023-08-08 NOTE — Progress Notes (Signed)
 PCP: Miki Kins, FNP HF Cardiology: Dr. Shirlee Latch  78 y.o. with history of HTN, type 2 diabetes, OSA on CPAP, and pulmonary hypertension/RV failure was referred by Dr. Kirke Corin for evaluation of pulmonary hypertension. It appears that PH was diagnosed as early as 2018 by echo.  Echo in 2020 showed PA systolic pressure 85 mmHg.  She says that she was told that the pulmonary hypertension was due to OSA.  She has been compliant with CPAP. She reports exertional dyspnea for about 4 years now.   In 8/21, she was admitted to [facility] with CHF.  Echo showed EF 55-60%, mild LVH, D-shaped septum, severe RV dilation with moderate RV systolic dysfunction, PASP 83, moderate TR, moderate PI, dilated IVC.  RHC/LHC showed no coronary disease, severe pulmonary hypertension with PVR 7.38 and preserved cardiac output.  High resolution CT chest in 9/21 showed no evidence for fibrotic ILD.    She saw rheumatology and was given no definite rheumatological diagnosis.    Echo in 7/22 showed EF 65-70% with moderate LVH, RV moderately dilated with moderately decreased RV systolic function, mild RVH, D-shaped septum, PASP 85 mmHg, moderate PR, normal IVC.  Echo in 9/23 showed EF 55-60%, D-shaped septum, severe RV dilation with severe RV dysfunction, mild TR, PASP 50 mmHg.   She is off Uptravi, unable to tolerate even the lowest dose due to diarrhea and headache.  She tried Tyvaso DPI; she developed headaches and did not like inhaling and found the device hard to manipulate so she stopped it.  She was not interested in trying the inhaler form of Tyvaso.    Admitted 3/24 CHF/RV failure, no improvement after increase in Lasix. Diuresed with IV lasix, eventually transitioned to po Lasix 40 bid and discharged home, weight 168.5 lbs. Seen in ED 09/09/22 with dyspnea. BNP >2k, but felt she was stable for discharge, with close HF follow up.   Echo in 11/24 showed EF 60-65%, moderate LVH, mildly D-shaped interventricular septum, mild RV  enlargement and mildly decreased systolic function, unable to estimate PA systolic pressure.   Today she returns for RV failure/pulmonary hypertension follow up. She had been doing well at last appointment but feels substantially worse today.  Weight is actually down 3 lbs.  However, she reports a high sodium diet and has noted increased dyspnea. She has to stop to rest several times walking into church.  She is short of breath walking unless she goes very slowly. Energy level is poor.  She has felt too tired to go to pulmonary rehab.  She has had a lot of anxiety about recent MGUS diagnosis. She has had chronic loose stool, usually has a couple of episodes in the morning.  This does not really seem to be affected by uptitration of orenitram. She is up to 3.25 mg tid orenitram.    ECG (personally reviewed): NSR, iRBBB, RVH, inferolateral TWIs  Labs (8/21): K 3.3, creatinine 1.39, ANA negative, anti-Sm negative, anti-SCL-70 negative, ANCA negative, RF elevated 52 but CCP negative, ESR 67, CRP 2.2 Labs (9/21): K 4.5, creatinine 1.8 => 1.45, anti-centromere Ab negative, BNP 309 Labs (11/21): K 4.1, creatinine 1.47, BNP 590 Labs (1/22): LDL 55, K 3.8, creatinine 1.61 Labs (5/22): LDL 74, K 4, creatinine 1.5 Labs (9/22): K 3.8, creatinine 1.43 Labs (12/22): LDL 54, K 3.8, creatinine 1.3 Labs (2/23): BNP 197, K 3.4, creatinine 1.3 Labs (6/23): BNP 862 Labs (9/23): K 4, creatinine 1.87 Labs (2/24): K 3.9, creatinine 1.39, LFTs normal Labs (4/24): K 3.5,  creatinine 1.65 Labs (5/24): LDL 75 Labs (8/24): K 3.8, creatinine 1.27, hgb 9.3, LDL 50 Labs (9/24): K 4.4, creatinine 1.3 Labs (1/25): K 4, creatinine 1.39  6 minute walk (8/21): 154 m 6 minute walk (11/21): 183 m 6 minute walk (3/22): 351 m 6 minute walk (11/22): 381 m 6 minute walk (2/23): 290 m (off Uptravi) 6 minute walk (8/24): 335 m  PMH: 1. HTN 2. Type 2 diabetes 3. OSA: Uses CPAP.  4. GERD 5. Hypothyroidism 6. Pulmonary  hypertension/RV failure: Pulmonary hypertension diagnosed 2018 by echo.  - PFTs (6/18): moderate restriction - Echo 2020 with PASP 85 mmHg.  - Echo (8/21): EF 55-60%, mild LVH, D-shaped septum, severe RV dilation with moderate RV systolic dysfunction, PASP 83, moderate TR, moderate PI, dilated IVC.  - LHC/RHC (8/21): Normal coronaries; mean RA 3, PA 82/21 mean 44, mean PCWP 14, CI 2.2, PVR 7.38 WU.  - V/Q scan (8/21): No evidence for chronic PE.  - Serologic workup: ANA negative, anti-Sm negative, anti-SCL-70 negative, ANCA negative, RF elevated 52 but CCP negative, ESR 67, CRP 2.2, anti-centromere negative - High resolution CT chest: No fibrotic ILD.  - Echo (7/22): EF 65-70% with moderate LVH, RV moderately dilated with moderately decreased RV systolic function, mild RVH, D-shaped septum, PASP 85 mmHg, moderate PR, normal IVC.  - Echo (9/23): EF 55-60%, D-shaped septum, severe RV dilation with severe RV dysfunction, mild TR, PASP 50 mmHg. - Echo (11/24): EF 60-65%, moderate LVH, mildly D-shaped interventricular septum, mild RV enlargement and mildly decreased systolic function, unable to estimate PA systolic pressure.  7. CKD stage 3.  8. Cirrhosis: Noted on 9/23 CT abdomen/pelvis. Suspect cardiogenic.   Social History   Socioeconomic History   Marital status: Widowed    Spouse name: Chanetta Marshall   Number of children: 3   Years of education: 14   Highest education level: Not on file  Occupational History    Comment: work part time, day care   Occupation: Retired  Tobacco Use   Smoking status: Never   Smokeless tobacco: Never  Vaping Use   Vaping status: Never Used  Substance and Sexual Activity   Alcohol use: No    Alcohol/week: 0.0 standard drinks of alcohol   Drug use: No   Sexual activity: Not Currently    Birth control/protection: None, Post-menopausal  Other Topics Concern   Not on file  Social History Narrative   Caffeine use- coffee 2 cups daily, Coke once a week, green tea  daily   Social Drivers of Corporate investment banker Strain: Not on file  Food Insecurity: No Food Insecurity (05/15/2023)   Hunger Vital Sign    Worried About Running Out of Food in the Last Year: Never true    Ran Out of Food in the Last Year: Never true  Transportation Needs: No Transportation Needs (05/15/2023)   PRAPARE - Administrator, Civil Service (Medical): No    Lack of Transportation (Non-Medical): No  Physical Activity: Not on file  Stress: Not on file  Social Connections: Not on file  Intimate Partner Violence: Not At Risk (05/15/2023)   Humiliation, Afraid, Rape, and Kick questionnaire    Fear of Current or Ex-Partner: No    Emotionally Abused: No    Physically Abused: No    Sexually Abused: No   Family History  Problem Relation Age of Onset   Diabetes Mother    Hypertension Mother    Stroke Mother    Heart disease  Mother        MI   Cancer Father        pancreatic   Stroke Father    Hypertension Sister    Diabetes Brother    Diabetes Maternal Grandfather    Diabetes Sister    Diabetes Sister    Diabetes Brother    Diabetes Brother    ROS: All systems reviewed and negative except as per HPI.   Current Outpatient Medications  Medication Sig Dispense Refill   ACCU-CHEK GUIDE test strip 3 each by Other route 3 (three) times daily as needed for other (low blood sugar symptoms). 100 each 6   ambrisentan (LETAIRIS) 10 MG tablet TAKE 1 TABLET BY MOUTH EVERY DAY 30 tablet 3   amLODipine (NORVASC) 10 MG tablet Take 1 tablet (10 mg total) by mouth daily. 90 tablet 3   aspirin 81 MG tablet Take 81 mg by mouth daily.     Cholecalciferol (VITAMIN D3) 125 MCG (5000 UT) CAPS Take 1 capsule by mouth daily.     clonazePAM (KLONOPIN) 1 MG tablet TAKE 1 TABLET BY MOUTH UP TO TWICE DAILY AS NEEDED FOR ANXIETY 60 tablet 1   dapagliflozin propanediol (FARXIGA) 10 MG TABS tablet Take 1 tablet (10 mg total) by mouth daily before breakfast. 90 tablet 3    diphenoxylate-atropine (LOMOTIL) 2.5-0.025 MG tablet Take 1 tablet by mouth 4 (four) times daily as needed for diarrhea or loose stools. 120 tablet 2   escitalopram (LEXAPRO) 10 MG tablet TAKE 1 TABLET BY MOUTH EVERY MORNING 90 tablet 1   esomeprazole (NEXIUM) 40 MG capsule Take 1 capsule (40 mg total) by mouth daily. 90 capsule 1   Fe Fum-FA-B Cmp-C-Zn-Mg-Mn-Cu (HEMOCYTE PLUS) 106-1 MG CAPS Take 1 capsule by mouth daily. 90 capsule 1   insulin aspart (NOVOLOG) 100 UNIT/ML injection Inject 4 Units into the skin. Bs greater than 200     Insulin Degludec (TRESIBA [place]) Inject 14 Units into the skin at bedtime.     Insulin Pen Needle (B-D UF III MINI PEN NEEDLES) 31G X 5 MM MISC 1 Needle by Other route 2 (two) times daily. 100 each 5   levothyroxine (SYNTHROID) 75 MCG tablet TAKE 1 TABLET BY MOUTH EVERY MORNING ON AN EMPTY STOMACH 90 tablet 1   loratadine (CLARITIN) 10 MG tablet Take 10 mg by mouth daily.      meclizine (ANTIVERT) 25 MG tablet Take 25 mg by mouth 3 (three) times daily as needed for dizziness.      metoprolol succinate (TOPROL-XL) 25 MG 24 hr tablet Take 1 tablet (25 mg total) by mouth daily. 90 tablet 3   Multiple Vitamins-Minerals (MULTI ADULT GUMMIES) CHEW Chew by mouth daily.     ORENITRAM 0.25 MG TBCR Take by mouth.     ORENITRAM 1 MG TBCR Take by mouth.     ORENITRAM MONTH 3 0.125 & 0.25 &1 MG TEPK Take 3 tablets by mouth 3 (three) times daily.     rosuvastatin (CRESTOR) 10 MG tablet Take 10 mg by mouth daily.     tadalafil, PAH, (ADCIRCA) 20 MG tablet Take 2 tablets (40 mg total) by mouth daily. 60 tablet 11   Treprostinil Diolamine ER (ORENITRAM) 0.125 MG TBCR Take 1 tablet by mouth 3 (three) times daily.     furosemide (LASIX) 20 MG tablet Take 3 tablets (60 mg total) by mouth 2 (two) times daily. 90 tablet 3   potassium chloride (KLOR-CON M) 20 MEQ tablet Take 1 tablet (20  mEq total) by mouth daily. 30 tablet 3   No current facility-administered medications for this visit.    Wt Readings from Last 3 Encounters:  08/08/23 154 lb (69.9 kg)  07/20/23 156 lb 6.4 oz (70.9 kg)  07/14/23 155 lb 12.8 oz (70.7 kg)   BP 131/87   Pulse 81   Wt 154 lb (69.9 kg)   LMP  (LMP Unknown)   SpO2 100%   BMI 24.12 kg/m  General: NAD Neck: JVP 8-9 cm with HJR, no thyromegaly or thyroid nodule.  Lungs: Clear to auscultation bilaterally with normal respiratory effort. CV: Nondisplaced PMI.  Heart regular S1/S2 with wide S2 split, no S3/S4, no murmur.  No peripheral edema.  No carotid bruit.  Normal pedal pulses.  Abdomen: Soft, nontender, no hepatosplenomegaly, no distention.  Skin: Intact without lesions or rashes.  Neurologic: Alert and oriented x 3.  Psych: Normal affect. Extremities: No clubbing or cyanosis.  HEENT: Normal.   Assessment/Plan: 1. HTN: BP controlled.   - Continue amlodipine.  2. OSA: Continue CPAP.    3. Pulmonary HTN/RV failure: Echo in 8/21 with EF 55-60%, mild LVH, D-shaped septum, severe RV dilation with moderate RV systolic dysfunction, PASP 83, moderate TR, moderate PI, dilated IVC.  Suspect long-standing RV dysfunction based on appearance of echo. RHC in 8/21 with severe pulmonary arterial hypertension, preserved cardiac output. PFTs from 6/18 showed moderate restriction.  V/Q scan from 8/21 showed no evidence for chronic PE. She has treated OSA.  Serologic workup was negative except for elevated CRP and ESR and elevated RF (normal CCP).  High resolution CT chest did not show evidence for ILD. I suspect long-standing group 1 PAH. Repeat echo in 7/22 showed EF 65-70% with moderate LVH, RV moderately dilated with moderately decreased RV systolic function, mild RVH, D-shaped septum, PASP 85 mmHg, moderate PR, normal IVC. Though echo still showed significant RV dysfunction, she symptomatically has felt better on PH meds.  She had to stop Uptravi due to intractable diarrhea and headache.  She was unable to use Tyvaso.  Echo in 9/23 showed EF 55-60%, D-shaped  septum, severe RV dilation with severe RV dysfunction, mild TR, PASP 50 mmHg. Echo in 11/24 showed improved RV; EF 60-65%, moderate LVH, mildly D-shaped interventricular septum, mild RV enlargement and mildly decreased systolic function, unable to estimate PA systolic pressure.  She is doing worse today than at last appointment.  She looks volume overloaded on exam and reports a high sodium diet.  NYHA class III. - Cut back sodium in diet.   - Increase Lasix to 60 mg bid. Increase KCl to 20 daily. BMET and BNP today, BMET in 10 days.  . - Continue Farxiga 10 mg daily.  - Continue ambrisentan 10.  - Continue tadalafil 40 mg daily.   - Continue uptitration of orenitram, aim for 4 mg tid gradually.  She is currently on 3.25 mg tid.  If she gets to 4 mg tid with minimal symptoms, can increase to 6 mg tid.  - Consider sotatercept as next step after full orenitram titration.  - Continue CPAP (OSA likely contributes but certainly cannot explain the extent of her PH).  - Worsening symptoms recently with volume overload.  I am going to plan for RHC after increased diuresis.  We discussed risks/benefits and she agrees to procedure.  4. Pulmonary insufficiency: Moderate on 9/23 echo, only mild on echo in 11/24.   5. Cirrhosis: Noted on imaging. Most likely due  to RV failure and passive  congestion, possible NAFLD.  6. Fatigue: Check CBC, TSH today.   Followup in 2 months.   I spent 42 minutes reviewing records, interviewing/examining patient, and managing orders. Marca Ancona  08/08/2023

## 2023-08-08 NOTE — Progress Notes (Signed)
 Cath orders placed and reviewed with pt and daughter, including the updated schedule date.

## 2023-08-09 ENCOUNTER — Encounter: Payer: Self-pay | Admitting: *Deleted

## 2023-08-10 LAB — BASIC METABOLIC PANEL
BUN/Creatinine Ratio: 13 (ref 12–28)
BUN: 20 mg/dL (ref 8–27)
CO2: 22 mmol/L (ref 20–29)
Calcium: 9.7 mg/dL (ref 8.7–10.3)
Chloride: 105 mmol/L (ref 96–106)
Creatinine, Ser: 1.57 mg/dL — ABNORMAL HIGH (ref 0.57–1.00)
Glucose: 95 mg/dL (ref 70–99)
Potassium: 4.3 mmol/L (ref 3.5–5.2)
Sodium: 142 mmol/L (ref 134–144)
eGFR: 34 mL/min/{1.73_m2} — ABNORMAL LOW (ref >59)

## 2023-08-10 LAB — CBC
Hematocrit: 40.1 % (ref 34.0–46.6)
Hemoglobin: 12.7 g/dL (ref 11.1–15.9)
MCH: 27.6 pg (ref 26.6–33.0)
MCHC: 31.7 g/dL (ref 31.5–35.7)
MCV: 87 fL (ref 79–97)
Platelets: 116 10*3/uL — ABNORMAL LOW (ref 150–450)
RBC: 4.6 x10E6/uL (ref 3.77–5.28)
RDW: 15.6 % — ABNORMAL HIGH (ref 11.7–15.4)
WBC: 3.8 10*3/uL (ref 3.4–10.8)

## 2023-08-10 LAB — MAGNESIUM: Magnesium: 2.2 mg/dL (ref 1.6–2.3)

## 2023-08-10 LAB — TSH: TSH: 2.16 u[IU]/mL (ref 0.450–4.500)

## 2023-08-10 LAB — BRAIN NATRIURETIC PEPTIDE: BNP: 865.8 pg/mL — ABNORMAL HIGH (ref 0.0–100.0)

## 2023-08-11 ENCOUNTER — Other Ambulatory Visit: Payer: Self-pay

## 2023-08-11 ENCOUNTER — Other Ambulatory Visit: Payer: Self-pay | Admitting: Family

## 2023-08-11 DIAGNOSIS — I5032 Chronic diastolic (congestive) heart failure: Secondary | ICD-10-CM

## 2023-08-13 ENCOUNTER — Other Ambulatory Visit: Payer: Self-pay

## 2023-08-13 DIAGNOSIS — Z8673 Personal history of transient ischemic attack (TIA), and cerebral infarction without residual deficits: Secondary | ICD-10-CM | POA: Diagnosis not present

## 2023-08-13 DIAGNOSIS — N1832 Chronic kidney disease, stage 3b: Secondary | ICD-10-CM | POA: Insufficient documentation

## 2023-08-13 DIAGNOSIS — I5032 Chronic diastolic (congestive) heart failure: Secondary | ICD-10-CM | POA: Insufficient documentation

## 2023-08-13 DIAGNOSIS — Z79899 Other long term (current) drug therapy: Secondary | ICD-10-CM | POA: Diagnosis not present

## 2023-08-13 DIAGNOSIS — M25571 Pain in right ankle and joints of right foot: Secondary | ICD-10-CM | POA: Diagnosis present

## 2023-08-13 DIAGNOSIS — Z955 Presence of coronary angioplasty implant and graft: Secondary | ICD-10-CM | POA: Insufficient documentation

## 2023-08-13 DIAGNOSIS — E1122 Type 2 diabetes mellitus with diabetic chronic kidney disease: Secondary | ICD-10-CM | POA: Insufficient documentation

## 2023-08-13 DIAGNOSIS — F32A Depression, unspecified: Secondary | ICD-10-CM | POA: Insufficient documentation

## 2023-08-13 DIAGNOSIS — J449 Chronic obstructive pulmonary disease, unspecified: Secondary | ICD-10-CM | POA: Diagnosis not present

## 2023-08-13 DIAGNOSIS — K219 Gastro-esophageal reflux disease without esophagitis: Secondary | ICD-10-CM | POA: Diagnosis not present

## 2023-08-13 DIAGNOSIS — Z794 Long term (current) use of insulin: Secondary | ICD-10-CM | POA: Insufficient documentation

## 2023-08-13 DIAGNOSIS — I272 Pulmonary hypertension, unspecified: Secondary | ICD-10-CM | POA: Diagnosis not present

## 2023-08-13 DIAGNOSIS — E785 Hyperlipidemia, unspecified: Secondary | ICD-10-CM | POA: Diagnosis not present

## 2023-08-13 DIAGNOSIS — I130 Hypertensive heart and chronic kidney disease with heart failure and stage 1 through stage 4 chronic kidney disease, or unspecified chronic kidney disease: Secondary | ICD-10-CM | POA: Insufficient documentation

## 2023-08-13 DIAGNOSIS — R252 Cramp and spasm: Secondary | ICD-10-CM | POA: Diagnosis not present

## 2023-08-13 DIAGNOSIS — Z7982 Long term (current) use of aspirin: Secondary | ICD-10-CM | POA: Diagnosis not present

## 2023-08-13 DIAGNOSIS — R197 Diarrhea, unspecified: Secondary | ICD-10-CM | POA: Insufficient documentation

## 2023-08-13 DIAGNOSIS — N179 Acute kidney failure, unspecified: Principal | ICD-10-CM | POA: Insufficient documentation

## 2023-08-13 DIAGNOSIS — E039 Hypothyroidism, unspecified: Secondary | ICD-10-CM | POA: Diagnosis not present

## 2023-08-13 DIAGNOSIS — M79606 Pain in leg, unspecified: Secondary | ICD-10-CM | POA: Diagnosis not present

## 2023-08-13 NOTE — ED Triage Notes (Signed)
 Pt arrived POV for right foot pain since last night after having a real bad cramp in her foot. Pt reports her toes are curled under which is not normal for her and she cannot place her foot flat on the ground d/t her toes being curled. Pt also reports since she ha the cramp last night her foot feels really heavy. Pt was ambulatory with cane, steady gait noted to triage. Pedal pulse present, CNS intact. No injury per pt.

## 2023-08-14 ENCOUNTER — Observation Stay
Admission: EM | Admit: 2023-08-14 | Discharge: 2023-08-14 | Disposition: A | Discharge status: Home / Self Care | Attending: Emergency Medicine | Admitting: Emergency Medicine

## 2023-08-14 DIAGNOSIS — F32A Depression, unspecified: Secondary | ICD-10-CM | POA: Insufficient documentation

## 2023-08-14 DIAGNOSIS — Z794 Long term (current) use of insulin: Secondary | ICD-10-CM

## 2023-08-14 DIAGNOSIS — N179 Acute kidney failure, unspecified: Principal | ICD-10-CM

## 2023-08-14 DIAGNOSIS — E785 Hyperlipidemia, unspecified: Secondary | ICD-10-CM

## 2023-08-14 DIAGNOSIS — E039 Hypothyroidism, unspecified: Secondary | ICD-10-CM | POA: Diagnosis present

## 2023-08-14 DIAGNOSIS — N189 Chronic kidney disease, unspecified: Secondary | ICD-10-CM | POA: Diagnosis not present

## 2023-08-14 DIAGNOSIS — R197 Diarrhea, unspecified: Secondary | ICD-10-CM

## 2023-08-14 DIAGNOSIS — I10 Essential (primary) hypertension: Secondary | ICD-10-CM

## 2023-08-14 DIAGNOSIS — K219 Gastro-esophageal reflux disease without esophagitis: Secondary | ICD-10-CM | POA: Insufficient documentation

## 2023-08-14 DIAGNOSIS — E1122 Type 2 diabetes mellitus with diabetic chronic kidney disease: Secondary | ICD-10-CM

## 2023-08-14 DIAGNOSIS — R252 Cramp and spasm: Secondary | ICD-10-CM

## 2023-08-14 DIAGNOSIS — E890 Postprocedural hypothyroidism: Secondary | ICD-10-CM | POA: Diagnosis not present

## 2023-08-14 LAB — MAGNESIUM: Magnesium: 2.2 mg/dL (ref 1.7–2.4)

## 2023-08-14 LAB — BASIC METABOLIC PANEL
Anion gap: 11 (ref 5–15)
Anion gap: 14 (ref 5–15)
BUN: 40 mg/dL — ABNORMAL HIGH (ref 8–23)
BUN: 41 mg/dL — ABNORMAL HIGH (ref 8–23)
CO2: 21 mmol/L — ABNORMAL LOW (ref 22–32)
CO2: 22 mmol/L (ref 22–32)
Calcium: 8.6 mg/dL — ABNORMAL LOW (ref 8.9–10.3)
Calcium: 9 mg/dL (ref 8.9–10.3)
Chloride: 101 mmol/L (ref 98–111)
Chloride: 107 mmol/L (ref 98–111)
Creatinine, Ser: 2.09 mg/dL — ABNORMAL HIGH (ref 0.44–1.00)
Creatinine, Ser: 2.4 mg/dL — ABNORMAL HIGH (ref 0.44–1.00)
GFR, Estimated: 20 mL/min — ABNORMAL LOW (ref >60)
GFR, Estimated: 24 mL/min — ABNORMAL LOW (ref >60)
Glucose, Bld: 115 mg/dL — ABNORMAL HIGH (ref 70–99)
Glucose, Bld: 172 mg/dL — ABNORMAL HIGH (ref 70–99)
Potassium: 3.9 mmol/L (ref 3.5–5.1)
Potassium: 4 mmol/L (ref 3.5–5.1)
Sodium: 136 mmol/L (ref 135–145)
Sodium: 140 mmol/L (ref 135–145)

## 2023-08-14 LAB — CBC WITH DIFFERENTIAL/PLATELET
Abs Immature Granulocytes: 0.01 10*3/uL (ref 0.00–0.07)
Basophils Absolute: 0 10*3/uL (ref 0.0–0.1)
Basophils Relative: 0 %
Eosinophils Absolute: 0 10*3/uL (ref 0.0–0.5)
Eosinophils Relative: 0 %
HCT: 44.3 % (ref 36.0–46.0)
Hemoglobin: 13.8 g/dL (ref 12.0–15.0)
Immature Granulocytes: 0 %
Lymphocytes Relative: 40 %
Lymphs Abs: 1.9 10*3/uL (ref 0.7–4.0)
MCH: 27.8 pg (ref 26.0–34.0)
MCHC: 31.2 g/dL (ref 30.0–36.0)
MCV: 89.3 fL (ref 80.0–100.0)
Monocytes Absolute: 0.2 10*3/uL (ref 0.1–1.0)
Monocytes Relative: 4 %
Neutro Abs: 2.6 10*3/uL (ref 1.7–7.7)
Neutrophils Relative %: 56 %
Platelets: 147 10*3/uL — ABNORMAL LOW (ref 150–400)
RBC: 4.96 MIL/uL (ref 3.87–5.11)
RDW: 16.6 % — ABNORMAL HIGH (ref 11.5–15.5)
WBC: 4.8 10*3/uL (ref 4.0–10.5)
nRBC: 0 % (ref 0.0–0.2)

## 2023-08-14 LAB — HEPATIC FUNCTION PANEL
ALT: 28 U/L (ref 0–44)
AST: 36 U/L (ref 15–41)
Albumin: 3.8 g/dL (ref 3.5–5.0)
Alkaline Phosphatase: 64 U/L (ref 38–126)
Bilirubin, Direct: 0.2 mg/dL (ref 0.0–0.2)
Indirect Bilirubin: 0.5 mg/dL (ref 0.3–0.9)
Total Bilirubin: 0.7 mg/dL (ref 0.0–1.2)
Total Protein: 7 g/dL (ref 6.5–8.1)

## 2023-08-14 LAB — CBC
HCT: 39.3 % (ref 36.0–46.0)
Hemoglobin: 12.7 g/dL (ref 12.0–15.0)
MCH: 28.3 pg (ref 26.0–34.0)
MCHC: 32.3 g/dL (ref 30.0–36.0)
MCV: 87.7 fL (ref 80.0–100.0)
Platelets: 110 10*3/uL — ABNORMAL LOW (ref 150–400)
RBC: 4.48 MIL/uL (ref 3.87–5.11)
RDW: 16.7 % — ABNORMAL HIGH (ref 11.5–15.5)
WBC: 4.3 10*3/uL (ref 4.0–10.5)
nRBC: 0 % (ref 0.0–0.2)

## 2023-08-14 LAB — HEMOGLOBIN A1C
Hgb A1c MFr Bld: 7.2 % — ABNORMAL HIGH (ref 4.8–5.6)
Mean Plasma Glucose: 159.94 mg/dL

## 2023-08-14 LAB — CBG MONITORING, ED: Glucose-Capillary: 122 mg/dL — ABNORMAL HIGH (ref 70–99)

## 2023-08-14 MED ORDER — AMLODIPINE BESYLATE 5 MG PO TABS
10.0000 mg | ORAL_TABLET | Freq: Every day | ORAL | Status: DC
  Administered 2023-08-14: 10 mg via ORAL
  Filled 2023-08-14: qty 2

## 2023-08-14 MED ORDER — MAGNESIUM HYDROXIDE 400 MG/5ML PO SUSP: 30.0000 mL | Freq: Every day | ORAL | Status: DC | PRN

## 2023-08-14 MED ORDER — LEVOTHYROXINE SODIUM 50 MCG PO TABS
75.0000 ug | ORAL_TABLET | Freq: Every day | ORAL | Status: DC
  Administered 2023-08-14: 75 ug via ORAL
  Filled 2023-08-14: qty 1

## 2023-08-14 MED ORDER — SODIUM CHLORIDE 0.9 % IV SOLN: INTRAVENOUS | Status: DC

## 2023-08-14 MED ORDER — POTASSIUM CHLORIDE CRYS ER 20 MEQ PO TBCR
20.0000 meq | EXTENDED_RELEASE_TABLET | Freq: Every day | ORAL | Status: DC
  Filled 2023-08-14: qty 1

## 2023-08-14 MED ORDER — INSULIN GLARGINE-YFGN 100 UNIT/ML ~~LOC~~ SOLN: 14.0000 [IU] | Freq: Every day | SUBCUTANEOUS | Status: DC

## 2023-08-14 MED ORDER — LOPERAMIDE HCL 2 MG PO CAPS
4.0000 mg | ORAL_CAPSULE | Freq: Once | ORAL | Status: AC
  Administered 2023-08-14: 4 mg via ORAL
  Filled 2023-08-14: qty 2

## 2023-08-14 MED ORDER — ADULT MULTIVITAMIN W/MINERALS CH
1.0000 | ORAL_TABLET | Freq: Every day | ORAL | Status: DC
  Administered 2023-08-14: 1 via ORAL
  Filled 2023-08-14: qty 1

## 2023-08-14 MED ORDER — ONDANSETRON HCL 4 MG/2ML IJ SOLN: 4.0000 mg | Freq: Four times a day (QID) | INTRAMUSCULAR | Status: DC | PRN

## 2023-08-14 MED ORDER — DAPAGLIFLOZIN PROPANEDIOL 10 MG PO TABS: 10.0000 mg | ORAL_TABLET | Freq: Every day | ORAL | Status: DC

## 2023-08-14 MED ORDER — FUROSEMIDE 40 MG PO TABS
60.0000 mg | ORAL_TABLET | Freq: Two times a day (BID) | ORAL | Status: DC
  Filled 2023-08-14: qty 2

## 2023-08-14 MED ORDER — ASPIRIN 81 MG PO TBEC
81.0000 mg | DELAYED_RELEASE_TABLET | Freq: Every day | ORAL | Status: DC
  Administered 2023-08-14: 81 mg via ORAL
  Filled 2023-08-14: qty 1

## 2023-08-14 MED ORDER — ROSUVASTATIN CALCIUM 20 MG PO TABS
10.0000 mg | ORAL_TABLET | Freq: Every day | ORAL | Status: DC
  Filled 2023-08-14: qty 1

## 2023-08-14 MED ORDER — VITAMIN D 25 MCG (1000 UNIT) PO TABS
5000.0000 [IU] | ORAL_TABLET | Freq: Every day | ORAL | Status: DC
Start: 2023-08-14 — End: 2023-08-14
  Administered 2023-08-14: 5000 [IU] via ORAL
  Filled 2023-08-14: qty 5

## 2023-08-14 MED ORDER — METOPROLOL SUCCINATE ER 50 MG PO TB24
25.0000 mg | ORAL_TABLET | Freq: Every day | ORAL | Status: DC
  Administered 2023-08-14: 25 mg via ORAL
  Filled 2023-08-14: qty 1

## 2023-08-14 MED ORDER — ENOXAPARIN SODIUM 30 MG/0.3ML IJ SOSY
30.0000 mg | PREFILLED_SYRINGE | INTRAMUSCULAR | Status: DC
  Administered 2023-08-14: 30 mg via SUBCUTANEOUS
  Filled 2023-08-14: qty 0.3

## 2023-08-14 MED ORDER — ESCITALOPRAM OXALATE 10 MG PO TABS
10.0000 mg | ORAL_TABLET | Freq: Every morning | ORAL | Status: DC
  Administered 2023-08-14: 10 mg via ORAL
  Filled 2023-08-14: qty 1

## 2023-08-14 MED ORDER — AMBRISENTAN 5 MG PO TABS: 10.0000 mg | ORAL_TABLET | Freq: Every day | ORAL | Status: DC

## 2023-08-14 MED ORDER — CLONAZEPAM 0.5 MG PO TABS: 1.0000 mg | ORAL_TABLET | Freq: Two times a day (BID) | ORAL | Status: DC | PRN

## 2023-08-14 MED ORDER — AMBRISENTAN 5 MG PO TABS
10.0000 mg | ORAL_TABLET | Freq: Every day | ORAL | Status: DC
Start: 2023-08-14 — End: 2023-08-14
  Filled 2023-08-14: qty 2

## 2023-08-14 MED ORDER — ACETAMINOPHEN 325 MG PO TABS: 650.0000 mg | ORAL_TABLET | Freq: Four times a day (QID) | ORAL | Status: DC | PRN

## 2023-08-14 MED ORDER — INSULIN ASPART 100 UNIT/ML IJ SOLN: 0.0000 [IU] | Freq: Every day | INTRAMUSCULAR | Status: DC

## 2023-08-14 MED ORDER — ACETAMINOPHEN 650 MG RE SUPP: 650.0000 mg | Freq: Four times a day (QID) | RECTAL | Status: DC | PRN

## 2023-08-14 MED ORDER — SODIUM CHLORIDE 0.9 % IV BOLUS (SEPSIS)
500.0000 mL | Freq: Once | INTRAVENOUS | Status: AC
  Administered 2023-08-14: 500 mL via INTRAVENOUS

## 2023-08-14 MED ORDER — PANTOPRAZOLE SODIUM 40 MG PO TBEC
40.0000 mg | DELAYED_RELEASE_TABLET | Freq: Every day | ORAL | Status: DC
  Administered 2023-08-14: 40 mg via ORAL
  Filled 2023-08-14: qty 1

## 2023-08-14 MED ORDER — TREPROSTINIL DIOLAMINE ER 0.25 MG PO TBCR: 0.2500 mg | EXTENDED_RELEASE_TABLET | Freq: Every day | ORAL | Status: DC

## 2023-08-14 MED ORDER — INSULIN ASPART 100 UNIT/ML IJ SOLN
0.0000 [IU] | Freq: Three times a day (TID) | INTRAMUSCULAR | Status: DC
  Administered 2023-08-14: 1 [IU] via SUBCUTANEOUS
  Filled 2023-08-14: qty 1

## 2023-08-14 MED ORDER — TRAZODONE HCL 50 MG PO TABS: 25.0000 mg | ORAL_TABLET | Freq: Every evening | ORAL | Status: DC | PRN

## 2023-08-14 MED ORDER — MECLIZINE HCL 25 MG PO TABS: 12.5000 mg | ORAL_TABLET | Freq: Three times a day (TID) | ORAL | Status: DC | PRN

## 2023-08-14 MED ORDER — ONDANSETRON HCL 4 MG PO TABS: 4.0000 mg | ORAL_TABLET | Freq: Four times a day (QID) | ORAL | Status: DC | PRN

## 2023-08-14 MED ORDER — LORATADINE 10 MG PO TABS
10.0000 mg | ORAL_TABLET | Freq: Every day | ORAL | Status: DC
  Administered 2023-08-14: 10 mg via ORAL
  Filled 2023-08-14: qty 1

## 2023-08-14 NOTE — ED Provider Notes (Signed)
 [facility] Provider Note    Event Date/Time   First MD Initiated Contact with Patient 08/14/23 0041     (approximate)   History   Right Foot Pain   HPI  Seynabou Kaisa Wofford is a 78 y.o. female with history of grade 1 diastolic dysfunction, COPD, hypertension, hyperlipidemia, diabetes, pulmonary hypertension who presents to the emergency department with complaints of intermittent cramping for the past week.  States symptoms were severe tonight where her toes were curled and she could not get them to straighten out.  She does report that her PCP recently increased her dose of Lasix this week and that she has also had multiple episodes of watery diarrhea today.  No vomiting.  No chest pain or shortness of breath.  No abdominal discomfort.   History provided by patient, daughter.    Past Medical History:  Diagnosis Date   Abnormal electrocardiogram 11/25/2013   Formatting of this note might be different from the original. Last Assessment & Plan: Resolved when went to the ER.  Will f/u with Dr. Welton Flakes Note: Unchanged Formatting of this note might be different from the original. Note: Unchanged Formatting of this note might be different from the original.  Formatting of this note might be different from the original. Last Assessment & Plan: Resolved when we   Allergy    Anemia    Anxiety    Bilateral hand pain 07/25/2018   Cerebral infarction, unspecified (HCC) 01/26/2015   Chest pain 08/09/2016   Chest pain, rule out acute myocardial infarction 08/09/2016   Chronic diastolic heart failure (HCC)    COPD (chronic obstructive pulmonary disease) (HCC)    Depression    Diabetes (HCC)    Diarrhea, functional 01/25/2016   Edema of lower extremity 02/03/2020   Elevated troponin level 01/09/2020   Enlarged RV (right ventricle)    GERD (gastroesophageal reflux disease)    Hepatic steatosis    History of stroke 08/26/2022   Formatting of this note might be  different from the original. Last Assessment & Plan:  Formatting of this note might be different from the original.  Continue ASA, statin     Hyperlipidemia    Hypertension    Hypokalemia 12/22/2015   Hypomagnesemia 03/22/2016   Insomnia    Macrocytosis 01/25/2016   Migraines    Nutmeg liver    Osteoporosis    Palpitations 11/25/2013   Formatting of this note might be different from the original. Note: Unchanged   Pulmonary hypertension (HCC)    [facility] spotted fever    Severe pulmonary arterial systolic hypertension (HCC)    Severe tricuspid regurgitation by prior echocardiogram    Sleep apnea    CPAP   Transient cerebral ischemia 2016   Type 2 diabetes mellitus with hyperglycemia (HCC) 02/14/2012   Formatting of this note might be different from the original. Last Assessment & Plan: Hgb A1C is 7.1%.  Don't make any changes now.  Will hopefully increase exercise when pulmonary hypertension is properly treated.  ? Pulmonary rehab Note: Unchanged Formatting of this note might be different from the original. Formatting of this note might be different from the original.  Formatting of this note    Vertigo    every 2-3 months    Past Surgical History:  Procedure Laterality Date   ABDOMINAL HYSTERECTOMY     CORONARY ANGIOPLASTY     ESOPHAGOGASTRODUODENOSCOPY (EGD) WITH PROPOFOL N/A 08/24/2015   Procedure: ESOPHAGOGASTRODUODENOSCOPY (EGD) WITH PROPOFOL with dialation;  Surgeon:  Midge Minium, MD;  Location: [facility];  Service: Endoscopy;  Laterality: N/A;  Diabetic - oral meds   RIGHT/LEFT HEART CATH AND CORONARY/GRAFT ANGIOGRAPHY N/A 01/13/2020   Procedure: RIGHT/LEFT HEART CATH AND CORONARY/GRAFT ANGIOGRAPHY;  Surgeon: Iran Ouch, MD;  Location: [facility] INVASIVE CV [facility];  Service: Cardiovascular;  Laterality: N/A;   TUBAL LIGATION      MEDICATIONS:  Prior to Admission medications   Medication Sig Start Date End Date Taking? Authorizing Provider  ACCU-CHEK GUIDE  test strip 3 each by Other route 3 (three) times daily as needed for other (low blood sugar symptoms). 08/01/22   Miki Kins, FNP  ambrisentan (LETAIRIS) 10 MG tablet TAKE 1 TABLET BY MOUTH EVERY DAY 05/08/23   Laurey Morale, MD  amLODipine (NORVASC) 10 MG tablet Take 1 tablet (10 mg total) by mouth daily. 05/26/23   Laurey Morale, MD  aspirin 81 MG tablet Take 81 mg by mouth daily.    [provider]  Cholecalciferol (VITAMIN D3) 125 MCG (5000 UT) CAPS Take 1 capsule by mouth daily.    [provider]  clonazePAM (KLONOPIN) 1 MG tablet TAKE 1 TABLET BY MOUTH UP TO TWICE DAILY AS NEEDED FOR ANXIETY 07/14/23   Miki Kins, FNP  dapagliflozin propanediol (FARXIGA) 10 MG TABS tablet Take 1 tablet (10 mg total) by mouth daily before breakfast. 05/26/23   [place], Anderson Malta, FNP  diphenoxylate-atropine (LOMOTIL) 2.5-0.025 MG tablet Take 1 tablet by mouth 4 (four) times daily as needed for diarrhea or loose stools. 01/20/23 01/20/24  Miki Kins, FNP  escitalopram (LEXAPRO) 10 MG tablet TAKE 1 TABLET BY MOUTH EVERY MORNING 03/15/23   Miki Kins, FNP  esomeprazole (NEXIUM) 40 MG capsule Take 1 capsule (40 mg total) by mouth daily. 06/14/23   Miki Kins, FNP  Fe Fum-FA-B Cmp-C-Zn-Mg-Mn-Cu (HEMOCYTE PLUS) 106-1 MG CAPS Take 1 capsule by mouth daily. 07/14/23   Miki Kins, FNP  furosemide (LASIX) 20 MG tablet Take 3 tablets (60 mg total) by mouth 2 (two) times daily. 08/08/23   Laurey Morale, MD  insulin aspart (NOVOLOG) 100 UNIT/ML injection Inject 4 Units into the skin. Bs greater than 200    [provider]  Insulin Degludec (TRESIBA Zalma) Inject 14 Units into the skin at bedtime.    [provider]  Insulin Pen Needle (B-D UF III MINI PEN NEEDLES) 31G X 5 MM MISC 1 Needle by Other route 2 (two) times daily. 04/12/23   Miki Kins, FNP  levothyroxine (SYNTHROID) 75 MCG tablet TAKE 1 TABLET BY MOUTH EVERY MORNING ON AN EMPTY STOMACH  04/14/23   Miki Kins, FNP  loratadine (CLARITIN) 10 MG tablet Take 10 mg by mouth daily.     [provider]  meclizine (ANTIVERT) 25 MG tablet Take 25 mg by mouth 3 (three) times daily as needed for dizziness.     [provider]  metoprolol succinate (TOPROL-XL) 25 MG 24 hr tablet Take 1 tablet (25 mg total) by mouth daily. 01/18/23   Laurey Morale, MD  Multiple Vitamins-Minerals (MULTI ADULT GUMMIES) CHEW Chew by mouth daily.    [provider]  ORENITRAM 0.25 MG TBCR Take by mouth. 01/06/23   [provider]  ORENITRAM 1 MG TBCR Take by mouth. 01/06/23   [provider]  ORENITRAM MONTH 3 0.125 & 0.25 &1 MG TEPK Take 3 tablets by mouth 3 (three) times daily. 12/20/22   [provider]  potassium chloride (KLOR-CON M) 20 MEQ tablet Take 1 tablet (20 mEq total) by mouth daily. 08/08/23   Laurey Morale, MD  rosuvastatin (CRESTOR) 10 MG tablet TAKE 1 TABLET BY MOUTH EVERY DAY 08/11/23   Miki Kins, FNP  tadalafil, PAH, (ADCIRCA) 20 MG tablet Take 2 tablets (40 mg total) by mouth daily. 06/26/23   Laurey Morale, MD  Treprostinil Diolamine ER (ORENITRAM) 0.125 MG TBCR Take 1 tablet by mouth 3 (three) times daily.    [provider]    Physical Exam   Triage Vital Signs: ED Triage Vitals  Encounter Vitals Group     BP 08/13/23 2318 (!) 146/98     Systolic BP Percentile --      Diastolic BP Percentile --      Pulse Rate 08/13/23 2318 86     Resp 08/13/23 2318 16     Temp 08/13/23 2318 (!) 97.4 F (36.3 C)     Temp Source 08/13/23 2318 Oral     SpO2 08/13/23 2318 100 %     Weight 08/13/23 2320 154 lb (69.9 kg)     Height 08/13/23 2320 5\' 7"  (1.702 m)     Head Circumference --      Peak Flow --      Pain Score 08/13/23 2320 2     Pain Loc --      Pain Education --      Exclude from Growth Chart --      Most recent vital signs: Vitals:   08/14/23 0325 08/14/23 0400  BP:  121/76  Pulse:  72  Resp:  18   Temp: (!) 97.3 F (36.3 C)   SpO2:  99%    CONSTITUTIONAL: Alert, responds appropriately to questions. Well-appearing; well-nourished HEAD: Normocephalic, atraumatic EYES: Conjunctivae clear, pupils appear equal, sclera nonicteric ENT: normal nose; moist mucous membranes NECK: Supple, normal ROM CARD: RRR; S1 and S2 appreciated RESP: Normal chest excursion without splinting or tachypnea; breath sounds clear and equal bilaterally; no wheezes, no rhonchi, no rales, no hypoxia or respiratory distress, speaking full sentences ABD/GI: Non-distended; soft, non-tender, no rebound, no guarding, no peritoneal signs BACK: The back appears normal EXT: Normal ROM in all joints; no deformity noted, no edema, no calf tenderness or calf swelling, 2+ DP pulses bilaterally SKIN: Normal color for age and race; warm; no rash on exposed skin NEURO: Moves all extremities equally, normal speech PSYCH: The patient's mood and manner are appropriate.   ED Results / Procedures / Treatments   LABS: (all labs ordered are listed, but only abnormal results are displayed) Labs Reviewed  CBC WITH DIFFERENTIAL/PLATELET - Abnormal; Notable for the following components:      Result Value   RDW 16.6 (*)    Platelets 147 (*)    All other components within normal limits  BASIC METABOLIC PANEL - Abnormal; Notable for the following components:   CO2 21 (*)    Glucose, Bld 172 (*)    BUN 40 (*)    Creatinine, Ser 2.40 (*)    GFR, Estimated 20 (*)    All other components within normal limits  BASIC METABOLIC PANEL - Abnormal; Notable for the following components:   Glucose, Bld 115 (*)    BUN 41 (*)    Creatinine, Ser 2.09 (*)    Calcium 8.6 (*)    GFR, Estimated 24 (*)    All other components within normal limits  CBC - Abnormal; Notable for the following components:  RDW 16.7 (*)    Platelets 110 (*)    All other components within normal limits  MAGNESIUM  HEMOGLOBIN A1C     EKG:   RADIOLOGY: My  personal review and interpretation of imaging:    I have personally reviewed all radiology reports.   No results found.   PROCEDURES:  Critical Care performed: No     Procedures    IMPRESSION / MDM / ASSESSMENT AND PLAN / ED COURSE  I reviewed the triage vital signs and the nursing notes.    Patient here with extremity cramping.  PCP recently increased dose of furosemide and patient has had diarrhea today.     DIFFERENTIAL DIAGNOSIS (includes but not limited to):   Electrolyte derangement, dehydration, kidney injury, muscle spasms   Patient's presentation is most consistent with acute presentation with potential threat to life or bodily function.   PLAN: Patient's electrolytes are normal however creatinine is 2.4.  Was 1.57 four days ago.  I suspect that this is from overdiuresis and dehydration from diarrhea.  Will begin hydrating patient as this is likely the cause of her symptoms of cramping.  No cramping currently.  No chest pain or shortness of breath.  Abdominal exam benign.  Normal potassium, sodium, magnesium.   MEDICATIONS GIVEN IN ED: Medications  0.9 %  sodium chloride infusion (0 mLs Intravenous Stopped 08/14/23 0327)  aspirin EC tablet 81 mg (has no administration in time range)  ambrisentan (LETAIRIS) tablet 10 mg (has no administration in time range)  amLODipine (NORVASC) tablet 10 mg (has no administration in time range)  furosemide (LASIX) tablet 60 mg (has no administration in time range)  metoprolol succinate (TOPROL-XL) 24 hr tablet 25 mg (has no administration in time range)  Treprostinil Diolamine ER TBCR 0.25 mg (has no administration in time range)  rosuvastatin (CRESTOR) tablet 10 mg (has no administration in time range)  escitalopram (LEXAPRO) tablet 10 mg (has no administration in time range)  dapagliflozin propanediol (FARXIGA) tablet 10 mg (has no administration in time range)  insulin glargine-yfgn (SEMGLEE) injection 14 Units (has no  administration in time range)  levothyroxine (SYNTHROID) tablet 75 mcg (75 mcg Oral Given 08/14/23 0528)  pantoprazole (PROTONIX) EC tablet 40 mg (has no administration in time range)  clonazePAM (KLONOPIN) tablet 1 mg (has no administration in time range)  cholecalciferol (VITAMIN D3) 25 MCG (1000 UNIT) tablet 5,000 Units (has no administration in time range)  multivitamin with minerals tablet 1 tablet (has no administration in time range)  potassium chloride SA (KLOR-CON M) CR tablet 20 mEq (has no administration in time range)  loratadine (CLARITIN) tablet 10 mg (has no administration in time range)  meclizine (ANTIVERT) tablet 12.5 mg (has no administration in time range)  enoxaparin (LOVENOX) injection 30 mg (has no administration in time range)  0.9 %  sodium chloride infusion ( Intravenous New Bag/Given 08/14/23 0326)  acetaminophen (TYLENOL) tablet 650 mg (has no administration in time range)    Or  acetaminophen (TYLENOL) suppository 650 mg (has no administration in time range)  traZODone (DESYREL) tablet 25 mg (has no administration in time range)  magnesium hydroxide (MILK OF MAGNESIA) suspension 30 mL (has no administration in time range)  ondansetron (ZOFRAN) tablet 4 mg (has no administration in time range)    Or  ondansetron (ZOFRAN) injection 4 mg (has no administration in time range)  insulin aspart (novoLOG) injection 0-9 Units ( Subcutaneous Not Given 08/14/23 0435)  insulin aspart (novoLOG) injection 0-5 Units (has no  administration in time range)  sodium chloride 0.9 % bolus 500 mL (0 mLs Intravenous Stopped 08/14/23 0311)     ED COURSE:  Consulted and discussed patient's case with hospitalist, Dr. Arville Care.  I have recommended admission and consulting physician agrees and will place admission orders.  Patient (and family if present) agree with this plan.   I reviewed all nursing notes, vitals, pertinent previous records.  All labs, EKGs, imaging ordered have been  independently reviewed and interpreted by myself.     OUTSIDE RECORDS REVIEWED: Reviewed cardiology note where Lasix was increased to 60 mg twice daily.       FINAL CLINICAL IMPRESSION(S) / ED DIAGNOSES   Final diagnoses:  AKI (acute kidney injury) (HCC)  Diarrhea, unspecified type  Muscle cramps     Rx / DC Orders   ED Discharge Orders     None        Note:  This document was prepared using Dragon voice recognition software and may include unintentional dictation errors.   Henry Demeritt, Layla Maw, DO 08/14/23 0530

## 2023-08-14 NOTE — Progress Notes (Signed)
 PHARMACIST - PHYSICIAN COMMUNICATION  CONCERNING:  Enoxaparin (Lovenox) for DVT Prophylaxis    RECOMMENDATION: Patient was prescribed enoxaprin 40mg  q24 hours for VTE prophylaxis.   Filed Weights   08/13/23 2320  Weight: 69.9 kg (154 lb)    Body mass index is 24.12 kg/m.  Estimated Creatinine Clearance: 19.4 mL/min (A) (by C-G formula based on SCr of 2.4 mg/dL (H)).  Patient is candidate for enoxaparin 30mg  every 24 hours based on CrCl <51ml/min or Weight <45kg  DESCRIPTION: Pharmacy has adjusted enoxaparin dose per [facility] policy.  Patient is now receiving enoxaparin 30 mg every 24 hours   Otelia Sergeant, PharmD, [facility] 08/14/2023 3:24 AM

## 2023-08-14 NOTE — Assessment & Plan Note (Signed)
 -  We will continue statin therapy.

## 2023-08-14 NOTE — Assessment & Plan Note (Signed)
-   We will continue antihypertensive therapy while holding off nephrotoxins.

## 2023-08-14 NOTE — Assessment & Plan Note (Signed)
-   We will continue Synthroid and check TSH level.

## 2023-08-14 NOTE — Discharge Summary (Signed)
 Physician Discharge Summary  Jamie Murray BJY:[id number] DOB: 1946-05-10 DOA: 08/14/2023  PCP: Miki Kins, FNP  Admit date: 08/14/2023 Discharge date: 08/14/2023  Admitted From: home  Disposition:  home   Recommendations for Outpatient Follow-up:  Follow up with PCP in 1-2 weeks Get BMP w/in 1-2 weeks to check GFR/Cr   Home Health: no  Equipment/Devices:  Discharge Condition: stable  CODE STATUS: full  Diet recommendation: Carb Modified  Brief/Interim Summary: HPI was taken from Dr. Arville Murray: Jamie Murray is a 78 y.o. female with medical history significant for type diabetes mellitus, COPD, GERD, hypertension, dyslipidemia, TIA and OSA on CPAP, presented to the emergency room with acute onset of muscle cramps over the last week mainly in the lower extremities.  She had her Lasix dose increased recently.  She has been having diarrhea every day per her report but about the bowel movements preceded by abdominal discomfort in the morning.  She has been taking Pepto-Bismol.  She stated that anytime she was outside she has to take antidiarrheal medication.  She was having an appointment for her recurrent diarrhea on Tuesday.  Stools have been dark but she has been taking iron.  No nausea or vomiting.  No fever or chills.  No chest pain or dyspnea or cough or wheezing.  No dysuria, oliguria or hematuria or flank pain.  No other bleeding diathesis.   ED Course: When she came to the ER hypertension was 97.4 and blood pressure 131/87 and later 146/98 with otherwise normal vital signs.  Labs revealed a BUN of 40 with a creatinine of 2.4 above previous levels and CBC showed thrombocytopenia  of 147 better than previous levels. EKG as reviewed by me : None.   The patient was given 500 mL IV normal saline bolus followed by 125 mL/h.  She will be admitted to a medical observation bed for further evaluation and management.  Discharge Diagnoses:  Principal Problem:   Acute kidney  injury superimposed on chronic kidney disease (HCC) Active Problems:   Hypothyroidism   Type 2 diabetes mellitus with chronic kidney disease, with long-term current use of insulin (HCC)   Dyslipidemia   GERD without esophagitis   Depression   Essential hypertension  AKI on CKDIIIb: Cr is trending down from day prior. Pt's states her GFR is usually around 14. Continue on IVFs   Hypothyroidism: continue on home dose of synthroid    HTN: continue on home dose of amlodipine, metoprolol   Pulmonary HTN: continue on home dose of ambrisentan    Depression: severity unknown. Continue on home dsoe of lexapro    GERD: continue on PPI    HLD: continue on statin    DM2: fair control, HbA1c 7.2. Continue on SSI w/ accuchecks while in the hospital   Discharge Instructions  Discharge Instructions     Diet - low sodium heart healthy   Complete by: As directed    Diet Carb Modified   Complete by: As directed    Discharge instructions   Complete by: As directed    F/u w/ PCP in 1-2 weeks. F/u w/ nephro, Dr. Thedore Mins, in 1-2 weeks. Get BMP within 1-2 weeks to check Cr/GFR (kidney function)   Increase activity slowly   Complete by: As directed       Allergies as of 08/14/2023       Reactions   Atorvastatin    Note: joint pain   Fluoxetine Other (See Comments)   "felt crazy"   Levemir [insulin  Detemir] Other (See Comments)   Bruising   Prozac [fluoxetine Hcl] Other (See Comments)   "felt crazy"        Medication List     TAKE these medications    Accu-Chek Guide test strip Generic drug: glucose blood 3 each by Other route 3 (three) times daily as needed for other (low blood sugar symptoms).   ambrisentan 10 MG tablet Commonly known as: LETAIRIS TAKE 1 TABLET BY MOUTH EVERY DAY   amLODipine 10 MG tablet Commonly known as: NORVASC Take 1 tablet (10 mg total) by mouth daily.   aspirin 81 MG tablet Take 81 mg by mouth daily.   B-D UF III MINI PEN NEEDLES 31G X 5 MM  Misc Generic drug: Insulin Pen Needle 1 Needle by Other route 2 (two) times daily.   clonazePAM 1 MG tablet Commonly known as: KLONOPIN TAKE 1 TABLET BY MOUTH UP TO TWICE DAILY AS NEEDED FOR ANXIETY   dapagliflozin propanediol 10 MG Tabs tablet Commonly known as: FARXIGA Take 1 tablet (10 mg total) by mouth daily before breakfast.   diphenoxylate-atropine 2.5-0.025 MG tablet Commonly known as: Lomotil Take 1 tablet by mouth 4 (four) times daily as needed for diarrhea or loose stools.   escitalopram 10 MG tablet Commonly known as: LEXAPRO TAKE 1 TABLET BY MOUTH EVERY MORNING   esomeprazole 40 MG capsule Commonly known as: NEXIUM Take 1 capsule (40 mg total) by mouth daily.   furosemide 20 MG tablet Commonly known as: LASIX Take 3 tablets (60 mg total) by mouth 2 (two) times daily.   Hemocyte Plus 106-1 MG Caps Take 1 capsule by mouth daily.   insulin aspart 100 UNIT/ML injection Commonly known as: novoLOG Inject 4 Units into the skin. Bs greater than 200   levothyroxine 75 MCG tablet Commonly known as: SYNTHROID TAKE 1 TABLET BY MOUTH EVERY MORNING ON AN EMPTY STOMACH   loratadine 10 MG tablet Commonly known as: CLARITIN Take 10 mg by mouth daily.   meclizine 25 MG tablet Commonly known as: ANTIVERT Take 25 mg by mouth 3 (three) times daily as needed for dizziness.   metoprolol succinate 25 MG 24 hr tablet Commonly known as: TOPROL-XL Take 1 tablet (25 mg total) by mouth daily.   Multi Adult Gummies Chew Chew by mouth daily.   Orenitram 0.125 MG Tbcr Generic drug: Treprostinil Diolamine ER Take 1 tablet by mouth 3 (three) times daily.   Orenitram Month 3 0.125 & 0.25 &1 MG Tepk Generic drug: Treprostinil Diolam (Month 3) Take 3 tablets by mouth 3 (three) times daily.   Orenitram 1 MG Tbcr Generic drug: Treprostinil Diolamine ER Take by mouth.   Orenitram 0.25 MG Tbcr Generic drug: Treprostinil Diolamine ER Take by mouth.   potassium chloride SA 20  MEQ tablet Commonly known as: KLOR-CON M Take 1 tablet (20 mEq total) by mouth daily.   rosuvastatin 10 MG tablet Commonly known as: CRESTOR TAKE 1 TABLET BY MOUTH EVERY DAY   tadalafil (PAH) 20 MG tablet Commonly known as: ADCIRCA Take 2 tablets (40 mg total) by mouth daily.   TRESIBA [place] Inject 14 Units into the skin at bedtime.   Vitamin D3 125 MCG (5000 UT) Caps Take 1 capsule by mouth daily.        Allergies  Allergen Reactions   Atorvastatin     Note: joint pain   Fluoxetine Other (See Comments)    "felt crazy"   Levemir [Insulin Detemir] Other (See Comments)    Bruising  Prozac [Fluoxetine Hcl] Other (See Comments)    "felt crazy"    Consultations:    Procedures/Studies: No results found. (Echo, Carotid, EGD, Colonoscopy, ERCP)    Subjective: Pt c/o fatigue    Discharge Exam: Vitals:   08/14/23 0800 08/14/23 0941  BP: 130/88   Pulse: 77   Resp: 16   Temp:  97.6 F (36.4 C)  SpO2: 100%    Vitals:   08/14/23 0530 08/14/23 0600 08/14/23 0800 08/14/23 0941  BP: 124/81 108/72 130/88   Pulse: 83 72 77   Resp:  15 16   Temp:    97.6 F (36.4 C)  TempSrc:    Oral  SpO2: 100% 98% 100%   Weight:      Height:        General: Pt is alert, awake, not in acute distress Cardiovascular: S1/S2 +, no rubs, no gallops Respiratory: CTA bilaterally, no wheezing, no rhonchi Abdominal: Soft, NT, ND, bowel sounds + Extremities: no edema, no cyanosis    The results of significant diagnostics from this hospitalization (including imaging, microbiology, ancillary and laboratory) are listed below for reference.     Microbiology: No results found for this or any previous visit (from the past 240 hours).   Labs: BNP (last 3 results) Recent Labs    01/18/23 1156 04/26/23 1211 08/08/23 1527  BNP 219.6* 115.6* 865.8*   Basic Metabolic Panel: Recent Labs  Lab 08/08/23 1527 08/14/23 0016 08/14/23 0331  NA 142 136 140  K 4.3 3.9 4.0  CL 105 101  107  CO2 22 21* 22  GLUCOSE 95 172* 115*  BUN 20 40* 41*  CREATININE 1.57* 2.40* 2.09*  CALCIUM 9.7 9.0 8.6*  MG 2.2 2.2  --    Liver Function Tests: Recent Labs  Lab 08/14/23 0331  AST 36  ALT 28  ALKPHOS 64  BILITOT 0.7  PROT 7.0  ALBUMIN 3.8   No results for input(s): "LIPASE", "AMYLASE" in the last 168 hours. No results for input(s): "AMMONIA" in the last 168 hours. CBC: Recent Labs  Lab 08/08/23 1527 08/14/23 0016 08/14/23 0331  WBC 3.8 4.8 4.3  NEUTROABS  --  2.6  --   HGB 12.7 13.8 12.7  HCT 40.1 44.3 39.3  MCV 87 89.3 87.7  PLT 116* 147* 110*   Cardiac Enzymes: No results for input(s): "CKTOTAL", "CKMB", "CKMBINDEX", "TROPONINI" in the last 168 hours. BNP: Invalid input(s): "POCBNP" CBG: Recent Labs  Lab 08/14/23 0756  GLUCAP 122*   D-Dimer No results for input(s): "DDIMER" in the last 72 hours. Hgb A1c No results for input(s): "HGBA1C" in the last 72 hours. Lipid Profile No results for input(s): "CHOL", "HDL", "LDLCALC", "TRIG", "CHOLHDL", "LDLDIRECT" in the last 72 hours. Thyroid function studies No results for input(s): "TSH", "T4TOTAL", "T3FREE", "THYROIDAB" in the last 72 hours.  Invalid input(s): "FREET3" Anemia work up No results for input(s): "VITAMINB12", "FOLATE", "FERRITIN", "TIBC", "IRON", "RETICCTPCT" in the last 72 hours. Urinalysis    Component Value Date/Time   COLORURINE YELLOW (A) 02/07/2022 1435   APPEARANCEUR HAZY (A) 02/07/2022 1435   APPEARANCEUR Cloudy (A) 04/28/2015 1100   LABSPEC 1.031 (H) 02/07/2022 1435   LABSPEC 1.024 04/17/2012 1056   PHURINE 5.0 02/07/2022 1435   GLUCOSEU 50 (A) 02/07/2022 1435   GLUCOSEU Negative 04/17/2012 1056   HGBUR NEGATIVE 02/07/2022 1435   BILIRUBINUR NEGATIVE 02/07/2022 1435   BILIRUBINUR Negative 04/28/2015 1100   BILIRUBINUR Negative 04/17/2012 1056   KETONESUR NEGATIVE 02/07/2022 1435   PROTEINUR 100 (  A) 02/07/2022 1435   NITRITE NEGATIVE 02/07/2022 1435   LEUKOCYTESUR NEGATIVE  02/07/2022 1435   LEUKOCYTESUR 1+ 04/17/2012 1056   Sepsis Labs Recent Labs  Lab 08/08/23 1527 08/14/23 0016 08/14/23 0331  WBC 3.8 4.8 4.3   Microbiology No results found for this or any previous visit (from the past 240 hours).   Time coordinating discharge: Over 30 minutes  SIGNED:   Charise Killian, MD  [facility] 08/14/2023, 9:47 AM Pager   If 7PM-7AM, please contact night-coverage [email or web address]

## 2023-08-14 NOTE — Progress Notes (Signed)
 Monitoring by Pharmacy for Pulmonary Hypertension Treatment   Indication - Continuation of prior to admission medication   Patient is 78 y.o.  with history of PAH on chronic ambrisentan (LETAIRIS) PTA and will be continued while hospitalized.   Continuing this medication order as an inpatient requires that monitoring parameters per REMS requirements must be met.  Chronic therapy is under the supervision of Marca Ancona (add provider name) who is enrolled in the REMS program.  Per note from 01/31/2020 L. Marden Noble, RPh:  Sent in Ambrisentan prescription to [facility]. Faxed REMS patient application to Ambrisentan REMS. Patient received Ambrisentan Guide for Female Patients. Of note, patient is post-menopausal.  On admission pregnancy risk has been assessed and Patient is female and cannot get pregnant, and birth control option is appropriately not indicated as an option has been confirmed as birth control option for this patient.  Hepatic function has been evaluated. AST / ALT appropriate to continue medication at this time.     Latest Ref Rng & Units 06/29/2023    3:14 PM 01/20/2023   11:36 AM 10/20/2022   12:07 PM  Hepatic Function  Total Protein 6.5 - 8.1 g/dL 7.7  6.5  6.7   Albumin 3.5 - 5.0 g/dL 4.1  4.2  4.1   AST 15 - 41 U/L 18  14  19    ALT 0 - 44 U/L 12  10  10    Alk Phosphatase 38 - 126 U/L 64  71  74   Total Bilirubin 0.0 - 1.2 mg/dL 0.6  0.3  0.3     If any question arise or pregnancy is identified during hospitalization, contact for bosentan: [phone]; macitentan: [phone]; ambrisentan: [phone].  Thank for you allowing [place] to participate in the care of this patient.  Otelia Sergeant, PharmD, [facility] 08/14/2023 6:16 AM   Guides for Female Patient:  ambrisentan (LETAIRIS), macitentan (OPSUMIT), bosentan (TRACLEER).

## 2023-08-14 NOTE — Assessment & Plan Note (Signed)
-   The patient will be placed on supplemental coverage with NovoLog. - Will continue [place]. - Will continue basal coverage.

## 2023-08-14 NOTE — Assessment & Plan Note (Signed)
-   We will continue Lexapro. 

## 2023-08-14 NOTE — ED Notes (Signed)
 Pt ambulatory to restroom. Refuses cane or walker at this time.

## 2023-08-14 NOTE — Assessment & Plan Note (Signed)
 -  We will continue PPI therapy

## 2023-08-14 NOTE — ED Notes (Signed)
 Pt declined putting on gown at this time. Offers that maybe after she gets some sleep and feels better she will change.

## 2023-08-14 NOTE — Assessment & Plan Note (Addendum)
-   This is likely prerenal due to fluid depletion and dehydration with her diarrhea. - This is AKI superimposed on stage IIIb CKD. - She will be admitted to a medical observation bed. - We will continue hydration with IV normal saline. - We will avoid nephrotoxins. - Will follow BMP.

## 2023-08-14 NOTE — H&P (Signed)
 [place]   PATIENT NAME: Jamie Murray    MR#:  [id number]  DATE OF BIRTH:  [birth year]  DATE OF ADMISSION:  08/14/2023  PRIMARY CARE PHYSICIAN: Miki Kins, FNP   Patient is coming from: Home  REQUESTING/REFERRING PHYSICIAN: Ward, Layla Maw, DO  CHIEF COMPLAINT:   Chief Complaint  Patient presents with   Right Foot Pain    HISTORY OF PRESENT ILLNESS:  Jamie Murray is a 78 y.o. female with medical history significant for type diabetes mellitus, COPD, GERD, hypertension, dyslipidemia, TIA and OSA on CPAP, presented to the emergency room with acute onset of muscle cramps over the last week mainly in the lower extremities.  She had her Lasix dose increased recently.  She has been having diarrhea every day per her report but about the bowel movements preceded by abdominal discomfort in the morning.  She has been taking Pepto-Bismol.  She stated that anytime she was outside she has to take antidiarrheal medication.  She was having an appointment for her recurrent diarrhea on Tuesday.  Stools have been dark but she has been taking iron.  No nausea or vomiting.  No fever or chills.  No chest pain or dyspnea or cough or wheezing.  No dysuria, oliguria or hematuria or flank pain.  No other bleeding diathesis.  ED Course: When she came to the ER hypertension was 97.4 and blood pressure 131/87 and later 146/98 with otherwise normal vital signs.  Labs revealed a BUN of 40 with a creatinine of 2.4 above previous levels and CBC showed thrombocytopenia  of 147 better than previous levels. EKG as reviewed by me : None.  The patient was given 500 mL IV normal saline bolus followed by 125 mL/h.  She will be admitted to a medical observation bed for further evaluation and management.  PAST MEDICAL HISTORY:   Past Medical History:  Diagnosis Date   Abnormal electrocardiogram 11/25/2013   Formatting of this note might be different from the original. Last Assessment & Plan:  Resolved when went to the ER.  Will f/u with Dr. Welton Flakes Note: Unchanged Formatting of this note might be different from the original. Note: Unchanged Formatting of this note might be different from the original.  Formatting of this note might be different from the original. Last Assessment & Plan: Resolved when we   Allergy    Anemia    Anxiety    Bilateral hand pain 07/25/2018   Cerebral infarction, unspecified (HCC) 01/26/2015   Chest pain 08/09/2016   Chest pain, rule out acute myocardial infarction 08/09/2016   Chronic diastolic heart failure (HCC)    COPD (chronic obstructive pulmonary disease) (HCC)    Depression    Diabetes (HCC)    Diarrhea, functional 01/25/2016   Edema of lower extremity 02/03/2020   Elevated troponin level 01/09/2020   Enlarged RV (right ventricle)    GERD (gastroesophageal reflux disease)    Hepatic steatosis    History of stroke 08/26/2022   Formatting of this note might be different from the original. Last Assessment & Plan:  Formatting of this note might be different from the original.  Continue ASA, statin     Hyperlipidemia    Hypertension    Hypokalemia 12/22/2015   Hypomagnesemia 03/22/2016   Insomnia    Macrocytosis 01/25/2016   Migraines    Nutmeg liver    Osteoporosis    Palpitations 11/25/2013   Formatting of this note might be different from the original. Note:  Unchanged   Pulmonary hypertension (HCC)    [facility] spotted fever    Severe pulmonary arterial systolic hypertension (HCC)    Severe tricuspid regurgitation by prior echocardiogram    Sleep apnea    CPAP   Transient cerebral ischemia 2016   Type 2 diabetes mellitus with hyperglycemia (HCC) 02/14/2012   Formatting of this note might be different from the original. Last Assessment & Plan: Hgb A1C is 7.1%.  Don't make any changes now.  Will hopefully increase exercise when pulmonary hypertension is properly treated.  ? Pulmonary rehab Note: Unchanged Formatting of this note  might be different from the original. Formatting of this note might be different from the original.  Formatting of this note    Vertigo    every 2-3 months    PAST SURGICAL HISTORY:   Past Surgical History:  Procedure Laterality Date   ABDOMINAL HYSTERECTOMY     CORONARY ANGIOPLASTY     ESOPHAGOGASTRODUODENOSCOPY (EGD) WITH PROPOFOL N/A 08/24/2015   Procedure: ESOPHAGOGASTRODUODENOSCOPY (EGD) WITH PROPOFOL with dialation;  Surgeon: Midge Minium, MD;  Location: [facility];  Service: Endoscopy;  Laterality: N/A;  Diabetic - oral meds   RIGHT/LEFT HEART CATH AND CORONARY/GRAFT ANGIOGRAPHY N/A 01/13/2020   Procedure: RIGHT/LEFT HEART CATH AND CORONARY/GRAFT ANGIOGRAPHY;  Surgeon: Iran Ouch, MD;  Location: [facility] IN[facility];  Service: Cardiovascular;  Laterality: N/A;   TUBAL LIGATION      SOCIAL HISTORY:   Social History   Tobacco Use   Smoking status: Never   Smokeless tobacco: Never  Substance Use Topics   Alcohol use: No    Alcohol/week: 0.0 standard drinks of alcohol    FAMILY HISTORY:   Family History  Problem Relation Age of Onset   Diabetes Mother    Hypertension Mother    Stroke Mother    Heart disease Mother        MI   Cancer Father        pancreatic   Stroke Father    Hypertension Sister    Diabetes Brother    Diabetes Maternal Grandfather    Diabetes Sister    Diabetes Sister    Diabetes Brother    Diabetes Brother     DRUG ALLERGIES:   Allergies  Allergen Reactions   Atorvastatin     Note: joint pain   Fluoxetine Other (See Comments)    "felt crazy"   Levemir [Insulin Detemir] Other (See Comments)    Bruising   Prozac [Fluoxetine Hcl] Other (See Comments)    "felt crazy"    REVIEW OF SYSTEMS:   ROS As per history of present illness. All pertinent systems were reviewed above. Constitutional, HEENT, cardiovascular, respiratory, GI, GU, musculoskeletal, neuro, psychiatric, endocrine, integumentary and hematologic systems were  reviewed and are otherwise negative/unremarkable except for positive findings mentioned above in the HPI.   MEDICATIONS AT HOME:   Prior to Admission medications   Medication Sig Start Date End Date Taking? Authorizing Provider  ACCU-CHEK GUIDE test strip 3 each by Other route 3 (three) times daily as needed for other (low blood sugar symptoms). 08/01/22  Yes Miki Kins, FNP  ambrisentan (LETAIRIS) 10 MG tablet TAKE 1 TABLET BY MOUTH EVERY DAY 05/08/23  Yes Laurey Morale, MD  amLODipine (NORVASC) 10 MG tablet Take 1 tablet (10 mg total) by mouth daily. 05/26/23  Yes Laurey Morale, MD  aspirin 81 MG tablet Take 81 mg by mouth daily.   Yes [provider]  Cholecalciferol (  VITAMIN D3) 125 MCG (5000 UT) CAPS Take 1 capsule by mouth daily.   Yes [provider]  clonazePAM (KLONOPIN) 1 MG tablet TAKE 1 TABLET BY MOUTH UP TO TWICE DAILY AS NEEDED FOR ANXIETY 07/14/23  Yes Miki Kins, FNP  dapagliflozin propanediol (FARXIGA) 10 MG TABS tablet Take 1 tablet (10 mg total) by mouth daily before breakfast. 05/26/23  Yes [place], Jessica M, FNP  diphenoxylate-atropine (LOMOTIL) 2.5-0.025 MG tablet Take 1 tablet by mouth 4 (four) times daily as needed for diarrhea or loose stools. 01/20/23 01/20/24 Yes Miki Kins, FNP  escitalopram (LEXAPRO) 10 MG tablet TAKE 1 TABLET BY MOUTH EVERY MORNING 03/15/23  Yes Miki Kins, FNP  esomeprazole (NEXIUM) 40 MG capsule Take 1 capsule (40 mg total) by mouth daily. 06/14/23  Yes Miki Kins, FNP  furosemide (LASIX) 20 MG tablet Take 3 tablets (60 mg total) by mouth 2 (two) times daily. 08/08/23  Yes Laurey Morale, MD  insulin aspart (NOVOLOG) 100 UNIT/ML injection Inject 4 Units into the skin. Bs greater than 200   Yes [provider]  Insulin Degludec (TRESIBA [place]) Inject 14 Units into the skin at bedtime.   Yes [provider]  levothyroxine (SYNTHROID) 75 MCG tablet TAKE 1 TABLET BY MOUTH EVERY MORNING  ON AN EMPTY STOMACH 04/14/23  Yes Miki Kins, FNP  loratadine (CLARITIN) 10 MG tablet Take 10 mg by mouth daily.    Yes [provider]  meclizine (ANTIVERT) 25 MG tablet Take 25 mg by mouth 3 (three) times daily as needed for dizziness.    Yes [provider]  metoprolol succinate (TOPROL-XL) 25 MG 24 hr tablet Take 1 tablet (25 mg total) by mouth daily. 01/18/23  Yes Laurey Morale, MD  Multiple Vitamins-Minerals (MULTI ADULT GUMMIES) CHEW Chew by mouth daily.   Yes [provider]  ORENITRAM 0.25 MG TBCR Take by mouth. 01/06/23  Yes [provider]  ORENITRAM 1 MG TBCR Take by mouth. 01/06/23  Yes [provider]  ORENITRAM MONTH 3 0.125 & 0.25 &1 MG TEPK Take 3 tablets by mouth 3 (three) times daily. 12/20/22  Yes [provider]  potassium chloride (KLOR-CON M) 20 MEQ tablet Take 1 tablet (20 mEq total) by mouth daily. 08/08/23  Yes Laurey Morale, MD  rosuvastatin (CRESTOR) 10 MG tablet TAKE 1 TABLET BY MOUTH EVERY DAY 08/11/23  Yes Miki Kins, FNP  Treprostinil Diolamine ER (ORENITRAM) 0.125 MG TBCR Take 1 tablet by mouth 3 (three) times daily.   Yes [provider]  Fe Fum-FA-B Cmp-C-Zn-Mg-Mn-Cu (HEMOCYTE PLUS) 106-1 MG CAPS Take 1 capsule by mouth daily. 07/14/23   Miki Kins, FNP  Insulin Pen Needle (B-D UF III MINI PEN NEEDLES) 31G X 5 MM MISC 1 Needle by Other route 2 (two) times daily. 04/12/23   Miki Kins, FNP  tadalafil, PAH, (ADCIRCA) 20 MG tablet Take 2 tablets (40 mg total) by mouth daily. 06/26/23   Laurey Morale, MD      VITAL SIGNS:  Blood pressure 121/76, pulse 72, temperature (!) 97.3 F (36.3 C), temperature source Oral, resp. rate 18, height 5\' 7"  (1.702 m), weight 69.9 kg, SpO2 99%.  PHYSICAL EXAMINATION:  Physical Exam  GENERAL:  78 y.o.-year-old patient lying in the bed with no acute distress.  EYES: Pupils equal, round, reactive to light and accommodation. No scleral icterus.  Extraocular muscles intact.  HEENT: Head atraumatic, normocephalic. Oropharynx with slightly dry mucous membrane  and tongue and nasopharynx clear.  NECK:  Supple, no jugular venous distention. No thyroid enlargement, no tenderness.  LUNGS: Normal breath sounds bilaterally, no wheezing, rales,rhonchi or crepitation. No use of accessory muscles of respiration.  CARDIOVASCULAR: Regular rate and rhythm, S1, S2 normal. No murmurs, rubs, or gallops.  ABDOMEN: Soft, nondistended, nontender. Bowel sounds present. No organomegaly or mass.  EXTREMITIES: No pedal edema, cyanosis, or clubbing.  NEUROLOGIC: Cranial nerves II through XII are intact. Muscle strength 5/5 in all extremities. Sensation intact. Gait not checked.  PSYCHIATRIC: The patient is alert and oriented x 3.  Normal affect and good eye contact. SKIN: No obvious rash, lesion, or ulcer.   LABORATORY PANEL:   CBC Recent Labs  Lab 08/14/23 0331  WBC 4.3  HGB 12.7  HCT 39.3  PLT 110*   ------------------------------------------------------------------------------------------------------------------  Chemistries  Recent Labs  Lab 08/14/23 0016 08/14/23 0331  NA 136 140  K 3.9 4.0  CL 101 107  CO2 21* 22  GLUCOSE 172* 115*  BUN 40* 41*  CREATININE 2.40* 2.09*  CALCIUM 9.0 8.6*  MG 2.2  --    ------------------------------------------------------------------------------------------------------------------  Cardiac Enzymes No results for input(s): "TROPONINI" in the last 168 hours. ------------------------------------------------------------------------------------------------------------------  RADIOLOGY:  No results found.    IMPRESSION AND PLAN:  Assessment and Plan: * Acute kidney injury superimposed on chronic kidney disease (HCC) - This is likely prerenal due to fluid depletion and dehydration with her diarrhea. - This is AKI superimposed on stage IIIb CKD. - She will be admitted to a medical observation  bed. - We will continue hydration with IV normal saline. - We will avoid nephrotoxins. - Will follow BMP.  Hypothyroidism - We will continue Synthroid and check TSH level.  Essential hypertension - We will continue antihypertensive therapy while holding off nephrotoxins.  Depression - We will continue Lexapro.  GERD without esophagitis - We will continue PPI therapy.  Dyslipidemia - We will continue statin therapy  Type 2 diabetes mellitus with chronic kidney disease, with long-term current use of insulin (HCC) - The patient will be placed on supplemental coverage with NovoLog. - Will continue [place]. - Will continue basal coverage.     DVT prophylaxis: Lovenox. Advanced Care Planning:  Code Status: full code. Family Communication:  The plan of care was discussed in details with the patient (and family). I answered all questions. The patient agreed to proceed with the above mentioned plan. Further management will depend upon hospital course. Disposition Plan: Back to previous home environment Consults called: none. All the records are reviewed and case discussed with ED provider.  Status is: Observation   I certify that at the time of admission, it is my clinical judgment that the patient will require inpatient hospital care extending more than 2 midnights.                            Dispo: The patient is from: Home              Anticipated d/c is to: Home              Patient currently is not medically stable to d/c.              Difficult to place patient: No  Hannah Beat M.D on 08/14/2023 at 4:36 AM  [facility]   From 7 PM-7 AM, contact night-coverage [email or web address]  CC: Primary care physician; Miki Kins, FNP

## 2023-08-14 NOTE — ED Notes (Signed)
 Pt had become winded when walking from waiting room to triage two. Writer insisted on using wheelchair but pt adamantly refusing.

## 2023-08-15 ENCOUNTER — Encounter (HOSPITAL_COMMUNITY): Payer: Self-pay | Admitting: Cardiology

## 2023-08-16 DIAGNOSIS — I272 Pulmonary hypertension, unspecified: Secondary | ICD-10-CM

## 2023-08-16 NOTE — Progress Notes (Signed)
 Pulmonary Individual Treatment Plan  Patient Details  Name: Jamie Murray MRN: [id number] Date of Birth: 1946-03-30 Referring Provider:   Flowsheet Row Pulmonary Rehab from 05/24/2023 in [facility] Cardiac and Pulmonary Rehab  Referring Provider Dr. Marca Ancona       Initial Encounter Date:  Flowsheet Row Pulmonary Rehab from 05/24/2023 in [facility] Cardiac and Pulmonary Rehab  Date 05/24/23       Visit Diagnosis: Pulmonary hypertension (HCC)  Patient's Home Medications on Admission:  Current Outpatient Medications:    ACCU-CHEK GUIDE test strip, 3 each by Other route 3 (three) times daily as needed for other (low blood sugar symptoms)., Disp: 100 each, Rfl: 6   ambrisentan (LETAIRIS) 10 MG tablet, TAKE 1 TABLET BY MOUTH EVERY DAY, Disp: 30 tablet, Rfl: 3   amLODipine (NORVASC) 10 MG tablet, Take 1 tablet (10 mg total) by mouth daily., Disp: 90 tablet, Rfl: 3   aspirin 81 MG tablet, Take 81 mg by mouth daily., Disp: , Rfl:    Cholecalciferol (VITAMIN D3) 125 MCG (5000 UT) CAPS, Take 1 capsule by mouth daily., Disp: , Rfl:    clonazePAM (KLONOPIN) 1 MG tablet, TAKE 1 TABLET BY MOUTH UP TO TWICE DAILY AS NEEDED FOR ANXIETY, Disp: 60 tablet, Rfl: 1   dapagliflozin propanediol (FARXIGA) 10 MG TABS tablet, Take 1 tablet (10 mg total) by mouth daily before breakfast., Disp: 90 tablet, Rfl: 3   diphenoxylate-atropine (LOMOTIL) 2.5-0.025 MG tablet, Take 1 tablet by mouth 4 (four) times daily as needed for diarrhea or loose stools., Disp: 120 tablet, Rfl: 2   escitalopram (LEXAPRO) 10 MG tablet, TAKE 1 TABLET BY MOUTH EVERY MORNING, Disp: 90 tablet, Rfl: 1   esomeprazole (NEXIUM) 40 MG capsule, Take 1 capsule (40 mg total) by mouth daily., Disp: 90 capsule, Rfl: 1   Fe Fum-FA-B Cmp-C-Zn-Mg-Mn-Cu (HEMOCYTE PLUS) 106-1 MG CAPS, Take 1 capsule by mouth daily., Disp: 90 capsule, Rfl: 1   furosemide (LASIX) 20 MG tablet, Take 3 tablets (60 mg total) by mouth 2 (two) times daily., Disp: 90 tablet,  Rfl: 3   insulin aspart (NOVOLOG) 100 UNIT/ML injection, Inject 4 Units into the skin. Bs greater than 200, Disp: , Rfl:    Insulin Degludec (TRESIBA ), Inject 14 Units into the skin at bedtime., Disp: , Rfl:    Insulin Pen Needle (B-D UF III MINI PEN NEEDLES) 31G X 5 MM MISC, 1 Needle by Other route 2 (two) times daily., Disp: 100 each, Rfl: 5   levothyroxine (SYNTHROID) 75 MCG tablet, TAKE 1 TABLET BY MOUTH EVERY MORNING ON AN EMPTY STOMACH, Disp: 90 tablet, Rfl: 1   loratadine (CLARITIN) 10 MG tablet, Take 10 mg by mouth daily. , Disp: , Rfl:    meclizine (ANTIVERT) 25 MG tablet, Take 25 mg by mouth 3 (three) times daily as needed for dizziness. , Disp: , Rfl:    metoprolol succinate (TOPROL-XL) 25 MG 24 hr tablet, Take 1 tablet (25 mg total) by mouth daily., Disp: 90 tablet, Rfl: 3   Multiple Vitamins-Minerals (MULTI ADULT GUMMIES) CHEW, Chew by mouth daily., Disp: , Rfl:    ORENITRAM 0.25 MG TBCR, Take by mouth., Disp: , Rfl:    ORENITRAM 1 MG TBCR, Take by mouth., Disp: , Rfl:    ORENITRAM MONTH 3 0.125 & 0.25 &1 MG TEPK, Take 3 tablets by mouth 3 (three) times daily., Disp: , Rfl:    potassium chloride (KLOR-CON M) 20 MEQ tablet, Take 1 tablet (20 mEq total) by mouth daily., Disp:  30 tablet, Rfl: 3   rosuvastatin (CRESTOR) 10 MG tablet, TAKE 1 TABLET BY MOUTH EVERY DAY, Disp: 90 tablet, Rfl: 2   tadalafil, PAH, (ADCIRCA) 20 MG tablet, Take 2 tablets (40 mg total) by mouth daily., Disp: 60 tablet, Rfl: 11   Treprostinil Diolamine ER (ORENITRAM) 0.125 MG TBCR, Take 1 tablet by mouth 3 (three) times daily., Disp: , Rfl:   Past Medical History: Past Medical History:  Diagnosis Date   Abnormal electrocardiogram 11/25/2013   Formatting of this note might be different from the original. Last Assessment & Plan: Resolved when went to the ER.  Will f/u with Dr. Welton Flakes Note: Unchanged Formatting of this note might be different from the original. Note: Unchanged Formatting of this note might be  different from the original.  Formatting of this note might be different from the original. Last Assessment & Plan: Resolved when we   Allergy    Anemia    Anxiety    Bilateral hand pain 07/25/2018   Cerebral infarction, unspecified (HCC) 01/26/2015   Chest pain 08/09/2016   Chest pain, rule out acute myocardial infarction 08/09/2016   Chronic diastolic heart failure (HCC)    COPD (chronic obstructive pulmonary disease) (HCC)    Depression    Diabetes (HCC)    Diarrhea, functional 01/25/2016   Edema of lower extremity 02/03/2020   Elevated troponin level 01/09/2020   Enlarged RV (right ventricle)    GERD (gastroesophageal reflux disease)    Hepatic steatosis    History of stroke 08/26/2022   Formatting of this note might be different from the original. Last Assessment & Plan:  Formatting of this note might be different from the original.  Continue ASA, statin     Hyperlipidemia    Hypertension    Hypokalemia 12/22/2015   Hypomagnesemia 03/22/2016   Insomnia    Macrocytosis 01/25/2016   Migraines    Nutmeg liver    Osteoporosis    Palpitations 11/25/2013   Formatting of this note might be different from the original. Note: Unchanged   Pulmonary hypertension (HCC)    [facility] spotted fever    Severe pulmonary arterial systolic hypertension (HCC)    Severe tricuspid regurgitation by prior echocardiogram    Sleep apnea    CPAP   Transient cerebral ischemia 2016   Type 2 diabetes mellitus with hyperglycemia (HCC) 02/14/2012   Formatting of this note might be different from the original. Last Assessment & Plan: Hgb A1C is 7.1%.  Don't make any changes now.  Will hopefully increase exercise when pulmonary hypertension is properly treated.  ? Pulmonary rehab Note: Unchanged Formatting of this note might be different from the original. Formatting of this note might be different from the original.  Formatting of this note    Vertigo    every 2-3 months    Tobacco Use: Social  History   Tobacco Use  Smoking Status Never  Smokeless Tobacco Never    Labs: Review Flowsheet  More data exists      Latest Ref Rng & Units 07/15/2022 10/20/2022 01/20/2023 02/13/2023 08/14/2023  Labs for ITP Cardiac and Pulmonary Rehab  Cholestrol 100 - 199 mg/dL 147  829  562  - -  LDL (calc) 0 - 99 mg/dL 70  75  50  - -  HDL-C >39 mg/dL 42  48  48  - -  Trlycerides 0 - 149 mg/dL 130  865  784  - -  Hemoglobin A1c 4.8 - 5.6 % 6.3  8.2  - 6.2     7.2     Details       This result is from an external source.          Pulmonary Assessment Scores:  Pulmonary Assessment Scores     Row Name 05/24/23 1238         ADL UCSD   ADL Phase Entry     SOB Score total 5     Rest 0     Walk 1     Stairs 0     Bath 0     Dress 0     Shop 1       CAT Score   CAT Score 4       mMRC Score   mMRC Score 3              UCSD: Self-administered rating of dyspnea associated with activities of daily living (ADLs) 6-point scale (0 = "not at all" to 5 = "maximal or unable to do because of breathlessness")  Scoring Scores range from 0 to 120.  Minimally important difference is 5 units  CAT: CAT can identify the health impairment of COPD patients and is better correlated with disease progression.  CAT has a scoring range of zero to 40. The CAT score is classified into four groups of low (less than 10), medium (10 - 20), high (21-30) and very high (31-40) based on the impact level of disease on health status. A CAT score over 10 suggests significant symptoms.  A worsening CAT score could be explained by an exacerbation, poor medication adherence, poor inhaler technique, or progression of COPD or comorbid conditions.  CAT MCID is 2 points  mMRC: mMRC (Modified Medical Research Council) Dyspnea Scale is used to assess the degree of baseline functional disability in patients of respiratory disease due to dyspnea. No minimal important difference is established. A decrease in score of 1  point or greater is considered a positive change.   Pulmonary Function Assessment:   Exercise Target Goals: Exercise Program Goal: Individual exercise prescription set using results from initial 6 min walk test and THRR while considering  patient's activity barriers and safety.   Exercise Prescription Goal: Initial exercise prescription builds to 30-45 minutes a day of aerobic activity, 2-3 days per week.  Home exercise guidelines will be given to patient during program as part of exercise prescription that the participant will acknowledge.  Education: Aerobic Exercise: - Group verbal and visual presentation on the components of exercise prescription. Introduces F.I.T.T principle from ACSM for exercise prescriptions.  Reviews F.I.T.T. principles of aerobic exercise including progression. Written material given at graduation. Flowsheet Row Pulmonary Rehab from 05/24/2023 in [facility] Cardiac and Pulmonary Rehab  Education need identified 05/24/23       Education: Resistance Exercise: - Group verbal and visual presentation on the components of exercise prescription. Introduces F.I.T.T principle from ACSM for exercise prescriptions  Reviews F.I.T.T. principles of resistance exercise including progression. Written material given at graduation. Flowsheet Row Pulmonary Rehab from 12/03/2020 in [facility] Cardiac and Pulmonary Rehab  Date 12/03/20  Educator [facility]  Instruction Review Code 1- Verbalizes Understanding        Education: Exercise & Equipment Safety: - Individual verbal instruction and demonstration of equipment use and safety with use of the equipment. Flowsheet Row Pulmonary Rehab from 05/24/2023 in [facility] Cardiac and Pulmonary Rehab  Date 05/24/23  Educator [facility]  Instruction Review Code 1- [employer]  Education: Exercise Physiology & General Exercise Guidelines: - Group verbal and written instruction with models to review the exercise physiology of the cardiovascular  system and associated critical values. Provides general exercise guidelines with specific guidelines to those with heart or lung disease.  Flowsheet Row Pulmonary Rehab from 05/24/2023 in [facility] Cardiac and Pulmonary Rehab  Education need identified 05/24/23       Education: Flexibility, Balance, Mind/Body Relaxation: - Group verbal and visual presentation with interactive activity on the components of exercise prescription. Introduces F.I.T.T principle from ACSM for exercise prescriptions. Reviews F.I.T.T. principles of flexibility and balance exercise training including progression. Also discusses the mind body connection.  Reviews various relaxation techniques to help reduce and manage stress (i.e. Deep breathing, progressive muscle relaxation, and visualization). Balance handout provided to take home. Written material given at graduation. Flowsheet Row Pulmonary Rehab from 12/03/2020 in [facility] Cardiac and Pulmonary Rehab  Date 10/08/20  Educator [employer]  Instruction Review Code 1- Verbalizes Understanding       Activity Barriers & Risk Stratification:  Activity Barriers & Cardiac Risk Stratification - 05/24/23 1227       Activity Barriers & Cardiac Risk Stratification   Activity Barriers Back Problems;Muscular Weakness;Assistive Device;Balance Concerns             6 Minute Walk:  6 Minute Walk     Row Name 05/24/23 1221         6 Minute Walk   Phase Initial     Distance 900 feet     Walk Time 6 minutes     # of Rest Breaks 0     MPH 1.7     METS 2.19     RPE 11     Perceived Dyspnea  0     VO2 Peak 7.65     Symptoms No     Resting HR 64 bpm     Resting BP 138/82     Resting Oxygen Saturation  98 %     Exercise Oxygen Saturation  during 6 min walk 96 %     Max Ex. HR 109 bpm     Max Ex. BP 142/80     2 Minute Post BP 130/60       Interval HR   1 Minute HR 80     2 Minute HR 93     3 Minute HR 100     4 Minute HR 103     5 Minute HR 106     6 Minute HR 109     2  Minute Post HR 65     Interval Heart Rate? Yes       Interval Oxygen   Interval Oxygen? Yes     Baseline Oxygen Saturation % 98 %     1 Minute Oxygen Saturation % 96 %     1 Minute Liters of Oxygen 0 L     2 Minute Oxygen Saturation % 96 %     2 Minute Liters of Oxygen 0 L     3 Minute Oxygen Saturation % 98 %     3 Minute Liters of Oxygen 0 L     4 Minute Oxygen Saturation % 98 %     4 Minute Liters of Oxygen 0 L     5 Minute Oxygen Saturation % 99 %     5 Minute Liters of Oxygen 0 L     6 Minute Oxygen Saturation % 97 %     6  Minute Liters of Oxygen 0 L     2 Minute Post Oxygen Saturation % 98 %     2 Minute Post Liters of Oxygen 0 L             Oxygen Initial Assessment:  Oxygen Initial Assessment - 05/24/23 1238       Home Oxygen   Home Oxygen Device None    Sleep Oxygen Prescription CPAP    Home Exercise Oxygen Prescription None    Home Resting Oxygen Prescription None    Compliance with Home Oxygen Use No      Initial 6 min Walk   Oxygen Used None      Program Oxygen Prescription   Program Oxygen Prescription None      Intervention   Short Term Goals To learn and understand importance of monitoring SPO2 with pulse oximeter and demonstrate accurate use of the pulse oximeter.;To learn and understand importance of maintaining oxygen saturations>88%;To learn and demonstrate proper pursed lip breathing techniques or other breathing techniques. ;To learn and demonstrate proper use of respiratory medications    Long  Term Goals Verbalizes importance of monitoring SPO2 with pulse oximeter and return demonstration;Exhibits proper breathing techniques, such as pursed lip breathing or other method taught during program session;Maintenance of O2 saturations>88%;Compliance with respiratory medication             Oxygen Re-Evaluation:  Oxygen Re-Evaluation     Row Name 06/14/23 1342 07/03/23 1412           Program Oxygen Prescription   Program Oxygen Prescription  -- None        Home Oxygen   Home Oxygen Device -- None      Sleep Oxygen Prescription -- CPAP      Liters per minute -- 0      Home Exercise Oxygen Prescription -- None      Home Resting Oxygen Prescription -- None      Compliance with Home Oxygen Use -- No        Goals/Expected Outcomes   Short Term Goals -- To learn and demonstrate proper pursed lip breathing techniques or other breathing techniques.       Long  Term Goals -- Exhibits proper breathing techniques, such as pursed lip breathing or other method taught during program session      Comments Reviewed PLB technique with pt.  Talked about how it works and it's importance in maintaining their exercise saturations. Informed patient how to perform the Pursed Lipped breathing technique. Told patient to Inhale through the nose and out the mouth with pursed lips to keep their airways open, help oxygenate them better, practice when at rest or doing strenuous activity. Patient Verbalizes understanding of technique and will work on and be reiterated during [facility].      Goals/Expected Outcomes Short: Become more profiecient at using PLB.   Long: Become independent at using PLB. Short: use PLB with exertion. Long: use PLB on exertion proficiently and independently.               Oxygen Discharge (Final Oxygen Re-Evaluation):  Oxygen Re-Evaluation - 07/03/23 1412       Program Oxygen Prescription   Program Oxygen Prescription None      Home Oxygen   Home Oxygen Device None    Sleep Oxygen Prescription CPAP    Liters per minute 0    Home Exercise Oxygen Prescription None    Home Resting Oxygen Prescription None    Compliance with  Home Oxygen Use No      Goals/Expected Outcomes   Short Term Goals To learn and demonstrate proper pursed lip breathing techniques or other breathing techniques.     Long  Term Goals Exhibits proper breathing techniques, such as pursed lip breathing or other method taught during program session     Comments Informed patient how to perform the Pursed Lipped breathing technique. Told patient to Inhale through the nose and out the mouth with pursed lips to keep their airways open, help oxygenate them better, practice when at rest or doing strenuous activity. Patient Verbalizes understanding of technique and will work on and be reiterated during [facility].    Goals/Expected Outcomes Short: use PLB with exertion. Long: use PLB on exertion proficiently and independently.             Initial Exercise Prescription:  Initial Exercise Prescription - 05/24/23 1200       Date of Initial Exercise RX and Referring Provider   Date 05/24/23    Referring Provider Dr. Marca Ancona      Oxygen   Maintain Oxygen Saturation 88% or higher      Recumbant Bike   Level 1    RPM 50    Watts 15    Minutes 15    METs 2.19      NuStep   Level 1    SPM 80    Minutes 15    METs 2.19      REL-XR   Level 1    Speed 50    Minutes 15    METs 2.19      Biostep-RELP   Level 1    SPM 50    Minutes 15    METs 2.19      Track   Laps 22    Minutes 15    METs 2.2      Prescription Details   Frequency (times per week) 2    Duration Progress to 30 minutes of continuous aerobic without signs/symptoms of physical distress      Intensity   THRR 40-80% of Max Heartrate 98-128    Ratings of Perceived Exertion 11-13    Perceived Dyspnea 0-4      Progression   Progression Continue to progress workloads to maintain intensity without signs/symptoms of physical distress.      Resistance Training   Training Prescription Yes    Weight 2    Reps 10-15             Perform Capillary Blood Glucose checks as needed.  Exercise Prescription Changes:   Exercise Prescription Changes     Row Name 05/24/23 1200 06/28/23 0800 07/11/23 0700 07/12/23 1400 07/26/23 1100     Response to Exercise   Blood Pressure (Admit) 138/82 124/62 128/74 -- 142/78   Blood Pressure (Exercise) 142/80 160/62 148/72  -- 148/78   Blood Pressure (Exit) 130/60 110/62 124/80 -- 138/82   Heart Rate (Admit) 64 bpm 73 bpm 64 bpm -- 100 bpm   Heart Rate (Exercise) 109 bpm 114 bpm 93 bpm -- 124 bpm   Heart Rate (Exit) 65 bpm 70 bpm 70 bpm -- 91 bpm   Oxygen Saturation (Admit) 98 % 95 % 96 % -- 96 %   Oxygen Saturation (Exercise) 96 % 89 % 96 % -- 94 %   Oxygen Saturation (Exit) 98 % 95 % 97 % -- 95 %   Rating of Perceived Exertion (Exercise) 11 11 11  -- 13  Perceived Dyspnea (Exercise) 0 0 0 -- 1   Symptoms back pain 1/10 relieved with rest -- none -- none   Comments 6 MWT results First two weeks of exercise -- -- --   Duration -- Progress to 30 minutes of  aerobic without signs/symptoms of physical distress Progress to 30 minutes of  aerobic without signs/symptoms of physical distress Progress to 30 minutes of  aerobic without signs/symptoms of physical distress Progress to 30 minutes of  aerobic without signs/symptoms of physical distress   Intensity -- THRR unchanged THRR unchanged THRR unchanged THRR unchanged     Progression   Progression -- Continue to progress workloads to maintain intensity without signs/symptoms of physical distress. Continue to progress workloads to maintain intensity without signs/symptoms of physical distress. Continue to progress workloads to maintain intensity without signs/symptoms of physical distress. Continue to progress workloads to maintain intensity without signs/symptoms of physical distress.   Average METs -- 2 1.64 1.64 1.85     Resistance Training   Training Prescription -- Yes Yes Yes Yes   Weight -- 2lb 2lb 2lb 2lb   Reps -- 10-15 10-15 10-15 10-15     Interval Training   Interval Training -- No No No No     Treadmill   MPH -- -- -- -- 0.7   Grade -- -- -- -- 0   Minutes -- -- -- -- 15   METs -- -- -- -- 1.54     NuStep   Level -- 2 2 2 2    Minutes -- 15 15 15 15    METs -- 2.2 2.3 2.3 2     T5 Nustep   Level -- 2  T6 -- -- --   Minutes -- 15 -- -- --    METs -- 1.8 -- -- --     Biostep-RELP   Level -- -- 2 2 2    Minutes -- -- 15 15 15    METs -- -- 1 1 2      Track   Laps -- -- 5 5 16    Minutes -- -- 15 15 15    METs -- -- 1.27 1.27 1.87     Home Exercise Plan   Plans to continue exercise at -- -- -- Home (comment)  chair exercises and walking Home (comment)  chair exercises and walking   Frequency -- -- -- Add 2 additional days to program exercise sessions. Add 2 additional days to program exercise sessions.   Initial Home Exercises Provided -- -- -- 07/12/23 07/12/23     Oxygen   Maintain Oxygen Saturation -- 88% or higher 88% or higher 88% or higher 88% or higher    Row Name 08/10/23 1600             Response to Exercise   Blood Pressure (Admit) 122/82       Blood Pressure (Exercise) 142/80       Blood Pressure (Exit) 118/62       Heart Rate (Admit) 95 bpm       Heart Rate (Exercise) 96 bpm       Heart Rate (Exit) 83 bpm       Oxygen Saturation (Admit) 96 %       Oxygen Saturation (Exercise) 95 %       Oxygen Saturation (Exit) 95 %       Rating of Perceived Exertion (Exercise) 15       Perceived Dyspnea (Exercise) 1       Symptoms none  Duration Progress to 30 minutes of  aerobic without signs/symptoms of physical distress       Intensity THRR unchanged         Progression   Progression Continue to progress workloads to maintain intensity without signs/symptoms of physical distress.       Average METs 1.99         Resistance Training   Training Prescription Yes       Weight 2 lb       Reps 10-15         Interval Training   Interval Training No         Recumbant Bike   Level 1       Watts 15       Minutes 15       METs 2.67         NuStep   Level 2       Minutes 15       METs 1.3         Home Exercise Plan   Plans to continue exercise at Home (comment)  chair exercises and walking       Frequency Add 2 additional days to program exercise sessions.       Initial Home Exercises Provided 07/12/23          Oxygen   Maintain Oxygen Saturation 88% or higher                Exercise Comments:   Exercise Comments     Row Name 06/14/23 1340           Exercise Comments First full day of exercise!  Patient was oriented to gym and equipment including functions, settings, policies, and procedures.  Patient's individual exercise prescription and treatment plan were reviewed.  All starting workloads were established based on the results of the 6 minute walk test done at initial orientation visit.  The plan for exercise progression was also introduced and progression will be customized based on patient's performance and goals.                Exercise Goals and Review:   Exercise Goals     Row Name 05/24/23 1231             Exercise Goals   Increase Physical Activity Yes       Intervention Provide advice, education, support and counseling about physical activity/exercise needs.;Develop an individualized exercise prescription for aerobic and resistive training based on initial evaluation findings, risk stratification, comorbidities and participant's personal goals.       Expected Outcomes Short Term: Attend rehab on a regular basis to increase amount of physical activity.;Long Term: Add in home exercise to make exercise part of routine and to increase amount of physical activity.;Long Term: Exercising regularly at least 3-5 days a week.       Increase Strength and Stamina Yes       Intervention Provide advice, education, support and counseling about physical activity/exercise needs.;Develop an individualized exercise prescription for aerobic and resistive training based on initial evaluation findings, risk stratification, comorbidities and participant's personal goals.       Expected Outcomes Short Term: Increase workloads from initial exercise prescription for resistance, speed, and METs.;Short Term: Perform resistance training exercises routinely during rehab and add in resistance  training at home;Long Term: Improve cardiorespiratory fitness, muscular endurance and strength as measured by increased METs and functional capacity ( )       Able to understand and  use rate of perceived exertion (RPE) scale Yes       Intervention Provide education and explanation on how to use RPE scale       Expected Outcomes Short Term: Able to use RPE daily in rehab to express subjective intensity level;Long Term:  Able to use RPE to guide intensity level when exercising independently       Able to understand and use Dyspnea scale Yes       Intervention Provide education and explanation on how to use Dyspnea scale       Expected Outcomes Short Term: Able to use Dyspnea scale daily in rehab to express subjective sense of shortness of breath during exertion;Long Term: Able to use Dyspnea scale to guide intensity level when exercising independently       Knowledge and understanding of Target Heart Rate Range (THRR) Yes       Intervention Provide education and explanation of THRR including how the numbers were predicted and where they are located for reference       Expected Outcomes Short Term: Able to state/look up THRR;Long Term: Able to use THRR to govern intensity when exercising independently;Short Term: Able to use daily as guideline for intensity in rehab       Able to check pulse independently Yes       Intervention Provide education and demonstration on how to check pulse in carotid and radial arteries.;Review the importance of being able to check your own pulse for safety during independent exercise       Expected Outcomes Short Term: Able to explain why pulse checking is important during independent exercise;Long Term: Able to check pulse independently and accurately       Understanding of Exercise Prescription Yes       Intervention Provide education, explanation, and written materials on patient's individual exercise prescription       Expected Outcomes Short Term: Able to explain  program exercise prescription;Long Term: Able to explain home exercise prescription to exercise independently                Exercise Goals Re-Evaluation :  Exercise Goals Re-Evaluation     Row Name 06/14/23 1341 06/28/23 0816 07/11/23 0745 07/12/23 1427 07/26/23 1103     Exercise Goal Re-Evaluation   Exercise Goals Review Increase Physical Activity;Able to understand and use rate of perceived exertion (RPE) scale;Knowledge and understanding of Target Heart Rate Range (THRR);Understanding of Exercise Prescription;Increase Strength and Stamina;Able to understand and use Dyspnea scale;Able to check pulse independently Increase Physical Activity;Increase Strength and Stamina;Understanding of Exercise Prescription Increase Physical Activity;Increase Strength and Stamina;Understanding of Exercise Prescription Able to understand and use rate of perceived exertion (RPE) scale;Increase Physical Activity;Knowledge and understanding of Target Heart Rate Range (THRR);Understanding of Exercise Prescription;Increase Strength and Stamina;Able to understand and use Dyspnea scale;Able to check pulse independently Increase Physical Activity;Increase Strength and Stamina;Understanding of Exercise Prescription   Comments Reviewed RPE and dyspnea scale, THR and program prescription with pt today.  Pt voiced understanding and was given a copy of goals to take home. Jamie Murray is off to a good start in the program. She was able to use both the T4 and T6 nustep at an intensity of level 2. We will try to incorporate new exercise machines to her, in order to diversify her prescription. We will continue to monitor her progress in the program. Jamie Murray is doing well in rehab. She was able to maintain a workload of level 2 on the T4 nustep as well  as increase to level 2 on the biostep. We will continue to monitor and encourage her progression in the program. Reviewed home exercise with pt today from 2:15 pm to 2:26pm.  Pt plans to do  chair exercises and walk  for exercise.  Reviewed THR, pulse, RPE, sign and symptoms, pulse oximetery and when to call 911 or MD.  Also discussed weather considerations and indoor options.  Pt voiced understanding. Jamie Murray is doing well in rehab. She was able to increase her track laps from 5 to 16 in 15 minutes. She was also able to add the treadmill to her current exercise prescription at a speed of 0.7 and zero incline. We will continue to monitor her progress in the program.   Expected Outcomes Short: Use RPE daily to regulate intensity.  Long: Follow program prescription in THR. Short: Continue to follow current exercise prescription. Long: Continue exercise to improve strength and stamina. Short: Continue to increase workloads, push for more laps on the track. Long: Continue exercise to improvre strength and stamina. Short: add 1-2 days a week of home exercise on off days of rehab. Plans to do chair exercises and walk. Long: maintain independent exercise routine upon graduation from pulmonary rehab. Short: Continue to use treadmill, increase track laps. Long: Continue exercise to improve strength and stamina.    Row Name 08/10/23 1619             Exercise Goal Re-Evaluation   Exercise Goals Review Increase Physical Activity;Increase Strength and Stamina;Understanding of Exercise Prescription       Comments Jamie Murray has only attended one session since the last review. She continues to work at level 2 on the T4 nustep and level 1 on the recumbent bike. She recently called to inform us that she will be out of rehab on medical hold due to getting a heart cath on 08/24/2023. We will continue to monitor her progress when she returns.       Expected Outcomes Short: Return to rehab when appropriate. Long: Continue exercise to improve strength and stamina.                Discharge Exercise Prescription (Final Exercise Prescription Changes):  Exercise Prescription Changes - 08/10/23 1600        Response to Exercise   Blood Pressure (Admit) 122/82    Blood Pressure (Exercise) 142/80    Blood Pressure (Exit) 118/62    Heart Rate (Admit) 95 bpm    Heart Rate (Exercise) 96 bpm    Heart Rate (Exit) 83 bpm    Oxygen Saturation (Admit) 96 %    Oxygen Saturation (Exercise) 95 %    Oxygen Saturation (Exit) 95 %    Rating of Perceived Exertion (Exercise) 15    Perceived Dyspnea (Exercise) 1    Symptoms none    Duration Progress to 30 minutes of  aerobic without signs/symptoms of physical distress    Intensity THRR unchanged      Progression   Progression Continue to progress workloads to maintain intensity without signs/symptoms of physical distress.    Average METs 1.99      Resistance Training   Training Prescription Yes    Weight 2 lb    Reps 10-15      Interval Training   Interval Training No      Recumbant Bike   Level 1    Watts 15    Minutes 15    METs 2.67      NuStep   Level 2  Minutes 15    METs 1.3      Home Exercise Plan   Plans to continue exercise at Home (comment)   chair exercises and walking   Frequency Add 2 additional days to program exercise sessions.    Initial Home Exercises Provided 07/12/23      Oxygen   Maintain Oxygen Saturation 88% or higher             Nutrition:  Target Goals: Understanding of nutrition guidelines, daily intake of sodium 1500mg , cholesterol 200mg , calories 30% from fat and 7% or less from saturated fats, daily to have 5 or more servings of fruits and vegetables.  Education: All About Nutrition: -Group instruction provided by verbal, written material, interactive activities, discussions, models, and posters to present general guidelines for heart healthy nutrition including fat, fiber, MyPlate, the role of sodium in heart healthy nutrition, utilization of the nutrition label, and utilization of this knowledge for meal planning. Follow up email sent as well. Written material given at graduation. Flowsheet Row  Pulmonary Rehab from 12/03/2020 in [facility] Cardiac and Pulmonary Rehab  Date 10/15/20  Educator [facility]  Instruction Review Code 1- Verbalizes Understanding       Biometrics:  Pre Biometrics - 05/24/23 1231       Pre Biometrics   Height 5\' 7"  (1.702 m)    Weight 160 lb 8 oz (72.8 kg)    Waist Circumference 37.5 inches    Hip Circumference 40 inches    Waist to Hip Ratio 0.94 %    BMI (Calculated) 25.13    Single Leg Stand 0 seconds   attempted but was not able to stand on one foot.             Nutrition Therapy Plan and Nutrition Goals:  Nutrition Therapy & Goals - 05/24/23 1233       Nutrition Therapy   RD appointment deferred Yes             Nutrition Assessments:  MEDIFICTS Score Key: >=70 Need to make dietary changes  40-70 Heart Healthy Diet <= 40 Therapeutic Level Cholesterol Diet  Flowsheet Row Pulmonary Rehab from 05/24/2023 in [facility] Cardiac and Pulmonary Rehab  Picture Your Plate Total Score on Admission 61      Picture Your Plate Scores: <16 Unhealthy dietary pattern with much room for improvement. 41-50 Dietary pattern unlikely to meet recommendations for good health and room for improvement. 51-60 More healthful dietary pattern, with some room for improvement.  >60 Healthy dietary pattern, although there may be some specific behaviors that could be improved.   Nutrition Goals Re-Evaluation:  Nutrition Goals Re-Evaluation     Row Name 07/03/23 1414 07/24/23 1428           Goals   Comment Patient was informed on why it is important to maintain a balanced diet when dealing with Respiratory issues. Explained that it takes a lot of energy to breath and when they are short of breath often they will need to have a good diet to help keep up with the calories they are expending for breathing. Spoke with patient about scheduling RD appointment.      Expected Outcome Short: Choose and plan snacks accordingly to patients caloric intake to improve breathing.  Long: Maintain a diet independently that meets their caloric intake to aid in daily shortness of breath. STG: meet with RD on 2/24. LTG: maintain diet independently that meets caloiric needs and aids in SOB  Nutrition Goals Discharge (Final Nutrition Goals Re-Evaluation):  Nutrition Goals Re-Evaluation - 07/24/23 1428       Goals   Comment Spoke with patient about scheduling RD appointment.    Expected Outcome STG: meet with RD on 2/24. LTG: maintain diet independently that meets caloiric needs and aids in SOB             Psychosocial: Target Goals: Acknowledge presence or absence of significant depression and/or stress, maximize coping skills, provide positive support system. Participant is able to verbalize types and ability to use techniques and skills needed for reducing stress and depression.   Education: Stress, Anxiety, and Depression - Group verbal and visual presentation to define topics covered.  Reviews how body is impacted by stress, anxiety, and depression.  Also discusses healthy ways to reduce stress and to treat/manage anxiety and depression.  Written material given at graduation. Flowsheet Row Pulmonary Rehab from 12/03/2020 in [facility] Cardiac and Pulmonary Rehab  Date 11/12/20  Educator [facility]  Instruction Review Code 1- [employer] Understanding       Education: Sleep Hygiene -Provides group verbal and written instruction about how sleep can affect your health.  Define sleep hygiene, discuss sleep cycles and impact of sleep habits. Review good sleep hygiene tips.    Initial Review & Psychosocial Screening:  Initial Psych Review & Screening - 05/23/23 1307       Initial Review   Current issues with Current Sleep Concerns;Current Depression;History of Depression;Current Stress Concerns      Family Dynamics   Good Support System? Yes      Barriers   Psychosocial barriers to participate in program The patient should benefit from training in stress  management and relaxation.      Screening Interventions   Interventions Encouraged to exercise;Provide feedback about the scores to participant;To provide support and resources with identified psychosocial needs    Expected Outcomes Short Term goal: Utilizing psychosocial counselor, staff and physician to assist with identification of specific Stressors or current issues interfering with healing process. Setting desired goal for each stressor or current issue identified.;Long Term Goal: Stressors or current issues are controlled or eliminated.;Short Term goal: Identification and review with participant of any Quality of Life or Depression concerns found by scoring the questionnaire.;Long Term goal: The participant improves quality of Life and PHQ9 Scores as seen by post scores and/or verbalization of changes             Quality of Life Scores:  Scores of 19 and below usually indicate a poorer quality of life in these areas.  A difference of  2-3 points is a clinically meaningful difference.  A difference of 2-3 points in the total score of the Quality of Life Index has been associated with significant improvement in overall quality of life, self-image, physical symptoms, and general health in studies assessing change in quality of life.  PHQ-9: Review Flowsheet  More data exists      07/03/2023 05/24/2023 05/15/2023 12/01/2020 11/17/2020  Depression screen PHQ 2/9  Decreased Interest 3 3 0 0 1  Down, Depressed, Hopeless 2 2 1  0 0  PHQ - 2 Score 5 5 1  0 1  Altered sleeping 3 3 - 0 1  Tired, decreased energy 1 2 - 1 1  Change in appetite 3 0 - 0 0  Feeling bad or failure about yourself  2 1 - 0 0  Trouble concentrating 0 0 - 0 0  Moving slowly or fidgety/restless 0 0 - 0 0  Suicidal thoughts 0 0 - 0 0  PHQ-9 Score 14 11 - 1 3  Difficult doing work/chores Somewhat difficult Somewhat difficult - Not difficult at all Not difficult at all   Interpretation of Total Score  Total Score  Depression Severity:  1-4 = Minimal depression, 5-9 = Mild depression, 10-14 = Moderate depression, 15-19 = Moderately severe depression, 20-27 = Severe depression   Psychosocial Evaluation and Intervention:  Psychosocial Evaluation - 05/23/23 1314       Psychosocial Evaluation & Interventions   Interventions Encouraged to exercise with the program and follow exercise prescription;Stress management education;Relaxation education    Comments Jamie Murray it coming to pulmonary rehab wtih pulmonary htn. She has done the program before and knows what to expect. She states she really doesn't want to come, but knows she needs to take care of herself so she is going to try. She is still recovering from losing her husband September of last year and the financial and emotional stress she was left with. She has been seeing a therapist which has helped her a lot so she has someone she can "vent to." Jamie Murray states she has been sleeping well with her medicine. She feels like she is ready to move forward in managing her health.    Expected Outcomes Short: attend pulmonary rehab for education and exercise. Long: develop positive self care habits.    Continue Psychosocial Services  Follow up required by staff             Psychosocial Re-Evaluation:  Psychosocial Re-Evaluation     Row Name 07/03/23 1422             Psychosocial Re-Evaluation   Current issues with Current Stress Concerns;Current Sleep Concerns       Comments Reviewed patient health questionnaire (PHQ-9) with patient for follow up. Previously, patients score indicated signs/symptoms of depression.  Reviewed to see if patient is improving symptom wise while in program.  Score declined and patient states that it is because she has a diagnosis that is causing her to go to the Cancer center. She states she sleeps good but gets tired easily and sleeps to much.       Expected Outcomes Short: Continue to work toward an improvement in PHQ9 scores by  attending [facility] regularly. Long: Continue to improve stress and depression coping skills by talking with staff and attending [facility] regularly and work toward a positive mental state.       Interventions Encouraged to attend Pulmonary Rehabilitation for the exercise       Continue Psychosocial Services  Follow up required by staff         Initial Review   Source of Stress Concerns Chronic Illness;Family                Psychosocial Discharge (Final Psychosocial Re-Evaluation):  Psychosocial Re-Evaluation - 07/03/23 1422       Psychosocial Re-Evaluation   Current issues with Current Stress Concerns;Current Sleep Concerns    Comments Reviewed patient health questionnaire (PHQ-9) with patient for follow up. Previously, patients score indicated signs/symptoms of depression.  Reviewed to see if patient is improving symptom wise while in program.  Score declined and patient states that it is because she has a diagnosis that is causing her to go to the Cancer center. She states she sleeps good but gets tired easily and sleeps to much.    Expected Outcomes Short: Continue to work toward an improvement in PHQ9 scores by attending [facility] regularly. Long:  Continue to improve stress and depression coping skills by talking with staff and attending [facility] regularly and work toward a positive mental state.    Interventions Encouraged to attend Pulmonary Rehabilitation for the exercise    Continue Psychosocial Services  Follow up required by staff      Initial Review   Source of Stress Concerns Chronic Illness;Family             Education: Education Goals: Education classes will be provided on a weekly basis, covering required topics. Participant will state understanding/return demonstration of topics presented.  Learning Barriers/Preferences:  Learning Barriers/Preferences - 05/23/23 1307       Learning Barriers/Preferences   Learning Barriers None    Learning Preferences None              General Pulmonary Education Topics:  Infection Prevention: - Provides verbal and written material to individual with discussion of infection control including proper hand washing and proper equipment cleaning during exercise session. Flowsheet Row Pulmonary Rehab from 05/24/2023 in [facility] Cardiac and Pulmonary Rehab  Date 05/24/23  Educator [facility]  Instruction Review Code 1- Verbalizes Understanding       Falls Prevention: - Provides verbal and written material to individual with discussion of falls prevention and safety. Flowsheet Row Pulmonary Rehab from 05/24/2023 in [facility] Cardiac and Pulmonary Rehab  Date 05/24/23  Educator [facility]  Instruction Review Code 1- Verbalizes Understanding       Chronic Lung Disease Review: - Group verbal instruction with posters, models, PowerPoint presentations and videos,  to review new updates, new respiratory medications, new advancements in procedures and treatments. Providing information on websites and "800" numbers for continued self-education. Includes information about supplement oxygen, available portable oxygen systems, continuous and intermittent flow rates, oxygen safety, concentrators, and Medicare reimbursement for oxygen. Explanation of Pulmonary Drugs, including class, frequency, complications, importance of spacers, rinsing mouth after steroid MDI's, and proper cleaning methods for nebulizers. Review of basic lung anatomy and physiology related to function, structure, and complications of lung disease. Review of risk factors. Discussion about methods for diagnosing sleep apnea and types of masks and machines for OSA. Includes a review of the use of types of environmental controls: home humidity, furnaces, filters, dust mite/pet prevention, HEPA vacuums. Discussion about weather changes, air quality and the benefits of nasal washing. Instruction on Warning signs, infection symptoms, calling MD promptly, preventive modes, and value of  vaccinations. Review of effective airway clearance, coughing and/or vibration techniques. Emphasizing that all should Create an Action Plan. Written material given at graduation. Flowsheet Row Pulmonary Rehab from 05/24/2023 in [facility] Cardiac and Pulmonary Rehab  Education need identified 05/24/23       AED/CPR: - Group verbal and written instruction with the use of models to demonstrate the basic use of the AED with the basic ABC's of resuscitation.    Anatomy and Cardiac Procedures: - Group verbal and visual presentation and models provide information about basic cardiac anatomy and function. Reviews the testing methods done to diagnose heart disease and the outcomes of the test results. Describes the treatment choices: Medical Management, Angioplasty, or Coronary Bypass Surgery for treating various heart conditions including Myocardial Infarction, Angina, Valve Disease, and Cardiac Arrhythmias.  Written material given at graduation. Flowsheet Row Pulmonary Rehab from 12/03/2020 in [facility] Cardiac and Pulmonary Rehab  Date 12/03/20  Educator [facility]  Instruction Review Code 1- Verbalizes Understanding       Medication Safety: - Group verbal and visual instruction to review commonly prescribed medications for  heart and lung disease. Reviews the medication, class of the drug, and side effects. Includes the steps to properly store meds and maintain the prescription regimen.  Written material given at graduation. Flowsheet Row Pulmonary Rehab from 12/03/2020 in [facility] Cardiac and Pulmonary Rehab  Date 10/22/20  Educator [facility]  Instruction Review Code 1- Verbalizes Understanding       Other: -Provides group and verbal instruction on various topics (see comments)   Knowledge Questionnaire Score:  Knowledge Questionnaire Score - 05/24/23 1237       Knowledge Questionnaire Score   Pre Score 9/18              Core Components/Risk Factors/Patient Goals at Admission:  Personal Goals and Risk  Factors at Admission - 05/24/23 1237       Core Components/Risk Factors/Patient Goals on Admission    Weight Management Yes;Weight Maintenance    Intervention Weight Management: Develop a combined nutrition and exercise program designed to reach desired caloric intake, while maintaining appropriate intake of nutrient and fiber, sodium and fats, and appropriate energy expenditure required for the weight goal.;Weight Management: Provide education and appropriate resources to help participant work on and attain dietary goals.;Weight Management/Obesity: Establish reasonable short term and long term weight goals.    Admit Weight 160 lb 8 oz (72.8 kg)    Goal Weight: Short Term 160 lb (72.6 kg)    Goal Weight: Long Term 160 lb (72.6 kg)    Expected Outcomes Short Term: Continue to assess and modify interventions until short term weight is achieved;Long Term: Adherence to nutrition and physical activity/exercise program aimed toward attainment of established weight goal;Weight Maintenance: Understanding of the daily nutrition guidelines, which includes 25-35% calories from fat, 7% or less cal from saturated fats, less than 200mg  cholesterol, less than 1.5gm of sodium, & 5 or more servings of fruits and vegetables daily;Understanding recommendations for meals to include 15-35% energy as protein, 25-35% energy from fat, 35-60% energy from carbohydrates, less than 200mg  of dietary cholesterol, 20-35 gm of total fiber daily;Understanding of distribution of calorie intake throughout the day with the consumption of 4-5 meals/snacks    Improve shortness of breath with ADL's Yes    Intervention Provide education, individualized exercise plan and daily activity instruction to help decrease symptoms of SOB with activities of daily living.    Expected Outcomes Short Term: Improve cardiorespiratory fitness to achieve a reduction of symptoms when performing ADLs;Long Term: Be able to perform more ADLs without symptoms or  delay the onset of symptoms    Diabetes Yes    Intervention Provide education about signs/symptoms and action to take for hypo/hyperglycemia.;Provide education about proper nutrition, including hydration, and aerobic/resistive exercise prescription along with prescribed medications to achieve blood glucose in normal ranges: Fasting glucose 65-99 mg/dL    Expected Outcomes Short Term: Participant verbalizes understanding of the signs/symptoms and immediate care of hyper/hypoglycemia, proper foot care and importance of medication, aerobic/resistive exercise and nutrition plan for blood glucose control.;Long Term: Attainment of HbA1C < 7%.    Heart Failure Yes    Intervention Provide a combined exercise and nutrition program that is supplemented with education, support and counseling about heart failure. Directed toward relieving symptoms such as shortness of breath, decreased exercise tolerance, and extremity edema.    Expected Outcomes Improve functional capacity of life;Short term: Attendance in program 2-3 days a week with increased exercise capacity. Reported lower sodium intake. Reported increased fruit and vegetable intake. Reports medication compliance.;Short term: Daily weights obtained and reported  for increase. Utilizing diuretic protocols set by physician.;Long term: Adoption of self-care skills and reduction of barriers for early signs and symptoms recognition and intervention leading to self-care maintenance.    Hypertension Yes    Intervention Provide education on lifestyle modifcations including regular physical activity/exercise, weight management, moderate sodium restriction and increased consumption of fresh fruit, vegetables, and low fat dairy, alcohol moderation, and smoking cessation.;Monitor prescription use compliance.    Expected Outcomes Short Term: Continued assessment and intervention until BP is < 140/39mm HG in hypertensive participants. < 130/30mm HG in hypertensive participants  with diabetes, heart failure or chronic kidney disease.;Long Term: Maintenance of blood pressure at goal levels.    Lipids Yes    Intervention Provide education and support for participant on nutrition & aerobic/resistive exercise along with prescribed medications to achieve LDL 70mg , HDL >40mg .    Expected Outcomes Short Term: Participant states understanding of desired cholesterol values and is compliant with medications prescribed. Participant is following exercise prescription and nutrition guidelines.;Long Term: Cholesterol controlled with medications as prescribed, with individualized exercise RX and with personalized nutrition plan. Value goals: LDL < 70mg , HDL > 40 mg.             Education:Diabetes - Individual verbal and written instruction to review signs/symptoms of diabetes, desired ranges of glucose level fasting, after meals and with exercise. Acknowledge that pre and post exercise glucose checks will be done for 3 sessions at entry of program. Flowsheet Row Pulmonary Rehab from 05/24/2023 in [facility]  Date 05/24/23  Educator [facility]  Instruction Review Code 1- Verbalizes Understanding       Know Your Numbers and Heart Failure: - Group verbal and visual instruction to discuss disease risk factors for cardiac and pulmonary disease and treatment options.  Reviews associated critical values for Overweight/Obesity, Hypertension, Cholesterol, and Diabetes.  Discusses basics of heart failure: signs/symptoms and treatments.  Introduces Heart Failure Zone chart for action plan for heart failure.  Written material given at graduation. Flowsheet Row Pulmonary Rehab from 12/03/2020 in [facility]  Date 10/29/20  Educator [facility]  Instruction Review Code 1- Verbalizes Understanding       Core Components/Risk Factors/Patient Goals Review:   Goals and Risk Factor Review     Row Name 07/03/23 1413             Core Components/Risk  Factors/Patient Goals Review   Personal Goals Review Improve shortness of breath with ADL's       Review Spoke to patient about their shortness of breath and what they can do to improve. Patient has been informed of breathing techniques when starting the program. Patient is informed to tell staff if they have had any med changes and that certain meds they are taking or not taking can be causing shortness of breath.       Expected Outcomes Short: Attend [facility] regularly to improve shortness of breath with ADL's. Long: maintain independence with ADL's                Core Components/Risk Factors/Patient Goals at Discharge (Final Review):   Goals and Risk Factor Review - 07/03/23 1413       Core Components/Risk Factors/Patient Goals Review   Personal Goals Review Improve shortness of breath with ADL's    Review Spoke to patient about their shortness of breath and what they can do to improve. Patient has been informed of breathing techniques when starting the program. Patient is informed to tell  staff if they have had any med changes and that certain meds they are taking or not taking can be causing shortness of breath.    Expected Outcomes Short: Attend [facility] regularly to improve shortness of breath with ADL's. Long: maintain independence with ADL's             ITP Comments:  ITP Comments     Row Name 05/24/23 1213 06/14/23 1340 06/21/23 1259 07/19/23 0808 08/09/23 1248   ITP Comments Completed and gym orientation. Initial ITP created and sent for review to Dr. Jinny Sanders, Medical Director. First full day of exercise!  Patient was oriented to gym and equipment including functions, settings, policies, and procedures.  Patient's individual exercise prescription and treatment plan were reviewed.  All starting workloads were established based on the results of the 6 minute walk test done at initial orientation visit.  The plan for exercise progression was also introduced and  progression will be customized based on patient's performance and goals. 30 Day review completed. Medical Director ITP review done, changes made as directed, and signed approval by Medical Director.    new to program 30 Day review completed. Medical Director ITP review done, changes made as directed, and signed approval by Medical Director. Jamie Murray called to inform staff that she will be having a cath on 3/20. She will be placed on medical hold until then.    Row Name 08/16/23 1012           ITP Comments 30 Day review completed. Medical Director ITP review done, changes made as directed, and signed approval by Medical Director.                Comments: 30 day review

## 2023-08-17 ENCOUNTER — Telehealth: Payer: Self-pay

## 2023-08-17 ENCOUNTER — Telehealth: Payer: Self-pay | Admitting: *Deleted

## 2023-08-17 NOTE — Telephone Encounter (Signed)
 Patient Jamie Murray asking for call back to discuss hospital visit

## 2023-08-17 NOTE — Telephone Encounter (Signed)
 I called the patient and let her know that the next appt for her 01/16/2024 and she is ok with this.

## 2023-08-17 NOTE — Telephone Encounter (Signed)
 Pt lvm wanting to give you an FYI that she was in the hospital Sunday- Monday.

## 2023-08-18 ENCOUNTER — Other Ambulatory Visit (HOSPITAL_COMMUNITY): Payer: Self-pay | Admitting: Cardiology

## 2023-08-18 DIAGNOSIS — I5032 Chronic diastolic (congestive) heart failure: Secondary | ICD-10-CM

## 2023-08-18 DIAGNOSIS — E1159 Type 2 diabetes mellitus with other circulatory complications: Secondary | ICD-10-CM | POA: Diagnosis not present

## 2023-08-18 DIAGNOSIS — E1165 Type 2 diabetes mellitus with hyperglycemia: Secondary | ICD-10-CM | POA: Diagnosis not present

## 2023-08-18 DIAGNOSIS — Z794 Long term (current) use of insulin: Secondary | ICD-10-CM | POA: Diagnosis not present

## 2023-08-18 DIAGNOSIS — E063 Autoimmune thyroiditis: Secondary | ICD-10-CM | POA: Diagnosis not present

## 2023-08-18 DIAGNOSIS — I152 Hypertension secondary to endocrine disorders: Secondary | ICD-10-CM | POA: Diagnosis not present

## 2023-08-18 DIAGNOSIS — E782 Mixed hyperlipidemia: Secondary | ICD-10-CM | POA: Diagnosis not present

## 2023-08-19 LAB — BASIC METABOLIC PANEL
BUN/Creatinine Ratio: 18 (ref 12–28)
BUN: 36 mg/dL — ABNORMAL HIGH (ref 8–27)
CO2: 22 mmol/L (ref 20–29)
Calcium: 9.3 mg/dL (ref 8.7–10.3)
Chloride: 99 mmol/L (ref 96–106)
Creatinine, Ser: 1.97 mg/dL — ABNORMAL HIGH (ref 0.57–1.00)
Glucose: 126 mg/dL — ABNORMAL HIGH (ref 70–99)
Potassium: 4 mmol/L (ref 3.5–5.2)
Sodium: 136 mmol/L (ref 134–144)
eGFR: 26 mL/min/{1.73_m2} — ABNORMAL LOW (ref >59)

## 2023-08-20 ENCOUNTER — Other Ambulatory Visit: Payer: Self-pay | Admitting: Family

## 2023-08-20 DIAGNOSIS — E1165 Type 2 diabetes mellitus with hyperglycemia: Secondary | ICD-10-CM

## 2023-08-21 ENCOUNTER — Other Ambulatory Visit (HOSPITAL_COMMUNITY): Payer: Self-pay | Admitting: Cardiology

## 2023-08-21 DIAGNOSIS — I10 Essential (primary) hypertension: Secondary | ICD-10-CM | POA: Diagnosis not present

## 2023-08-21 DIAGNOSIS — D631 Anemia in chronic kidney disease: Secondary | ICD-10-CM | POA: Diagnosis not present

## 2023-08-21 DIAGNOSIS — I270 Primary pulmonary hypertension: Secondary | ICD-10-CM

## 2023-08-21 DIAGNOSIS — N184 Chronic kidney disease, stage 4 (severe): Secondary | ICD-10-CM | POA: Diagnosis not present

## 2023-08-21 DIAGNOSIS — E1122 Type 2 diabetes mellitus with diabetic chronic kidney disease: Secondary | ICD-10-CM | POA: Diagnosis not present

## 2023-08-21 DIAGNOSIS — N2581 Secondary hyperparathyroidism of renal origin: Secondary | ICD-10-CM | POA: Diagnosis not present

## 2023-08-23 ENCOUNTER — Other Ambulatory Visit: Payer: Self-pay | Admitting: Nurse Practitioner

## 2023-08-23 DIAGNOSIS — K760 Fatty (change of) liver, not elsewhere classified: Secondary | ICD-10-CM

## 2023-08-23 DIAGNOSIS — K761 Chronic passive congestion of liver: Secondary | ICD-10-CM

## 2023-08-24 ENCOUNTER — Encounter (HOSPITAL_COMMUNITY)
Admission: RE | Disposition: A | Discharge status: Home / Self Care | Payer: Self-pay | Source: Home / Self Care | Attending: Cardiology

## 2023-08-24 ENCOUNTER — Ambulatory Visit (HOSPITAL_COMMUNITY)
Admission: RE | Admit: 2023-08-24 | Discharge: 2023-08-24 | Disposition: A | Discharge status: Home / Self Care | Attending: Cardiology | Admitting: Cardiology

## 2023-08-24 ENCOUNTER — Encounter: Payer: Self-pay | Admitting: Oncology

## 2023-08-24 ENCOUNTER — Telehealth (HOSPITAL_COMMUNITY): Payer: Self-pay | Admitting: Pharmacist

## 2023-08-24 ENCOUNTER — Other Ambulatory Visit: Payer: Self-pay

## 2023-08-24 ENCOUNTER — Other Ambulatory Visit (HOSPITAL_COMMUNITY): Payer: Self-pay

## 2023-08-24 DIAGNOSIS — I272 Pulmonary hypertension, unspecified: Secondary | ICD-10-CM | POA: Diagnosis not present

## 2023-08-24 DIAGNOSIS — J984 Other disorders of lung: Secondary | ICD-10-CM | POA: Insufficient documentation

## 2023-08-24 DIAGNOSIS — Z794 Long term (current) use of insulin: Secondary | ICD-10-CM | POA: Diagnosis not present

## 2023-08-24 DIAGNOSIS — Z7984 Long term (current) use of oral hypoglycemic drugs: Secondary | ICD-10-CM | POA: Insufficient documentation

## 2023-08-24 DIAGNOSIS — Z79899 Other long term (current) drug therapy: Secondary | ICD-10-CM | POA: Diagnosis not present

## 2023-08-24 DIAGNOSIS — N183 Chronic kidney disease, stage 3 unspecified: Secondary | ICD-10-CM | POA: Diagnosis not present

## 2023-08-24 DIAGNOSIS — G4733 Obstructive sleep apnea (adult) (pediatric): Secondary | ICD-10-CM | POA: Diagnosis not present

## 2023-08-24 DIAGNOSIS — K7460 Unspecified cirrhosis of liver: Secondary | ICD-10-CM | POA: Diagnosis not present

## 2023-08-24 DIAGNOSIS — E1122 Type 2 diabetes mellitus with diabetic chronic kidney disease: Secondary | ICD-10-CM | POA: Insufficient documentation

## 2023-08-24 DIAGNOSIS — I129 Hypertensive chronic kidney disease with stage 1 through stage 4 chronic kidney disease, or unspecified chronic kidney disease: Secondary | ICD-10-CM | POA: Insufficient documentation

## 2023-08-24 HISTORY — PX: RIGHT HEART CATH: CATH118263

## 2023-08-24 LAB — POCT I-STAT EG7
Acid-base deficit: 3 mmol/L — ABNORMAL HIGH (ref 0.0–2.0)
Acid-base deficit: 1 mmol/L (ref 0.0–2.0)
Bicarbonate: 21.7 mmol/L (ref 20.0–28.0)
Bicarbonate: 22.8 mmol/L (ref 20.0–28.0)
Calcium, Ion: 1.24 mmol/L (ref 1.15–1.40)
Calcium, Ion: 1.24 mmol/L (ref 1.15–1.40)
HCT: 36 % (ref 36.0–46.0)
HCT: 36 % (ref 36.0–46.0)
Hemoglobin: 12.2 g/dL (ref 12.0–15.0)
Hemoglobin: 12.2 g/dL (ref 12.0–15.0)
O2 Saturation: 62 %
O2 Saturation: 62 %
Potassium: 3.4 mmol/L — ABNORMAL LOW (ref 3.5–5.1)
Potassium: 3.5 mmol/L (ref 3.5–5.1)
Sodium: 141 mmol/L (ref 135–145)
Sodium: 141 mmol/L (ref 135–145)
TCO2: 23 mmol/L (ref 22–32)
TCO2: 24 mmol/L (ref 22–32)
pCO2, Ven: 35.3 mmHg — ABNORMAL LOW (ref 44–60)
pCO2, Ven: 36 mmHg — ABNORMAL LOW (ref 44–60)
pH, Ven: 7.397 (ref 7.25–7.43)
pH, Ven: 7.411 (ref 7.25–7.43)
pO2, Ven: 32 mmHg (ref 32–45)
pO2, Ven: 32 mmHg (ref 32–45)

## 2023-08-24 LAB — GLUCOSE, CAPILLARY
Glucose-Capillary: 128 mg/dL — ABNORMAL HIGH (ref 70–99)
Glucose-Capillary: 95 mg/dL (ref 70–99)

## 2023-08-24 SURGERY — RIGHT HEART CATH
Anesthesia: LOCAL

## 2023-08-24 MED ORDER — ASPIRIN 81 MG PO CHEW: 81.0000 mg | CHEWABLE_TABLET | ORAL | Status: DC

## 2023-08-24 MED ORDER — LIDOCAINE HCL (PF) 1 % IJ SOLN
INTRAMUSCULAR | Status: DC | PRN
  Administered 2023-08-24: 2 mL

## 2023-08-24 MED ORDER — LIDOCAINE HCL (PF) 1 % IJ SOLN
INTRAMUSCULAR | Status: AC
  Filled 2023-08-24: qty 30

## 2023-08-24 MED ORDER — ACETAMINOPHEN 325 MG PO TABS: 650.0000 mg | ORAL_TABLET | ORAL | Status: DC | PRN

## 2023-08-24 MED ORDER — SODIUM CHLORIDE 0.9% FLUSH: 3.0000 mL | INTRAVENOUS | Status: DC | PRN

## 2023-08-24 MED ORDER — FUROSEMIDE 20 MG PO TABS: 40.0000 mg | ORAL_TABLET | Freq: Every day | ORAL | 3 refills | Status: DC

## 2023-08-24 MED ORDER — SODIUM CHLORIDE 0.9 % IV SOLN: 250.0000 mL | INTRAVENOUS | Status: DC | PRN

## 2023-08-24 MED ORDER — SODIUM CHLORIDE 0.9% FLUSH: 3.0000 mL | Freq: Two times a day (BID) | INTRAVENOUS | Status: DC

## 2023-08-24 MED ORDER — LABETALOL HCL 5 MG/ML IV SOLN: 10.0000 mg | INTRAVENOUS | Status: DC | PRN

## 2023-08-24 MED ORDER — SODIUM CHLORIDE 0.9 % IV SOLN: INTRAVENOUS | Status: DC

## 2023-08-24 MED ORDER — HEPARIN (PORCINE) IN NACL 1000-0.9 UT/500ML-% IV SOLN
INTRAVENOUS | Status: DC | PRN
  Administered 2023-08-24: 500 mL

## 2023-08-24 MED ORDER — ONDANSETRON HCL 4 MG/2ML IJ SOLN: 4.0000 mg | Freq: Four times a day (QID) | INTRAMUSCULAR | Status: DC | PRN

## 2023-08-24 MED ORDER — HYDRALAZINE HCL 20 MG/ML IJ SOLN: 10.0000 mg | INTRAMUSCULAR | Status: DC | PRN

## 2023-08-24 MED ORDER — FUROSEMIDE 20 MG PO TABS: 40.0000 mg | ORAL_TABLET | Freq: Two times a day (BID) | ORAL | 3 refills | Status: DC

## 2023-08-24 SURGICAL SUPPLY — 6 items
CATH WEDGE PA 7FR 110 (CATHETERS) IMPLANT
GLIDESHEATH SLENDER 7FR .021G (SHEATH) IMPLANT
PACK CARDIAC CATHETERIZATION (CUSTOM PROCEDURE TRAY) ×1 IMPLANT
SHEATH GLIDE SLENDER 4/5FR (SHEATH) IMPLANT
TRANSDUCER W/STOPCOCK (MISCELLANEOUS) IMPLANT
TUBING ART PRESS 72 MALE/FEM (TUBING) IMPLANT

## 2023-08-24 NOTE — Interval H&P Note (Signed)
 History and Physical Interval Note:  08/24/2023 11:50 AM  Jamie Murray  has presented today for surgery, with the diagnosis of hp.  The various methods of treatment have been discussed with the patient and family. After consideration of risks, benefits and other options for treatment, the patient has consented to  Procedure(s): RIGHT HEART CATH (N/A) as a surgical intervention.  The patient's history has been reviewed, patient examined, no change in status, stable for surgery.  I have reviewed the patient's chart and labs.  Questions were answered to the patient's satisfaction.     [name] [employer]

## 2023-08-24 NOTE — Discharge Instructions (Signed)

## 2023-08-24 NOTE — Telephone Encounter (Signed)
 Advanced Heart Failure Patient Advocate Encounter  Prior Authorization for Jamie Murray has been approved (CMM key [id number]).    PA# [id number] Effective dates: 06/07/23 through 06/05/24  Karle Plumber, PharmD, BCPS, BCCP, CPP Heart Failure Clinic Pharmacist [phone]

## 2023-08-25 ENCOUNTER — Encounter (HOSPITAL_COMMUNITY): Payer: Self-pay | Admitting: Cardiology

## 2023-08-28 ENCOUNTER — Encounter: Payer: Self-pay | Admitting: *Deleted

## 2023-08-28 DIAGNOSIS — I272 Pulmonary hypertension, unspecified: Secondary | ICD-10-CM

## 2023-08-28 NOTE — Progress Notes (Signed)
 Pulmonary Individual Treatment Plan  Patient Details  Name: Jamie Murray MRN: [id number] Date of Birth: 10-Oct-1945 Referring Provider:   Doristine Devoid Pulmonary Rehab from 05/24/2023 in [facility] Cardiac and Pulmonary Rehab  Referring Provider Dr. Marca Ancona       Initial Encounter Date:  Flowsheet Row Pulmonary Rehab from 05/24/2023 in [facility] Cardiac and Pulmonary Rehab  Date 05/24/23       Visit Diagnosis: Pulmonary hypertension (HCC)  Patient's Home Medications on Admission:  Current Outpatient Medications:    ACCU-CHEK GUIDE TEST test strip, TEST BLOOD SUGAR THREE TIMES DAILY AS NEEDED FOR OTHER( LOW BLOOD SUGAR SYMPTOMS), Disp: 100 strip, Rfl: 3   ambrisentan (LETAIRIS) 10 MG tablet, TAKE 1 TABLET BY MOUTH EVERY DAY, Disp: 30 tablet, Rfl: 3   amLODipine (NORVASC) 10 MG tablet, Take 1 tablet (10 mg total) by mouth daily., Disp: 90 tablet, Rfl: 3   aspirin 81 MG tablet, Take 81 mg by mouth daily., Disp: , Rfl:    Cholecalciferol (VITAMIN D3) 125 MCG (5000 UT) CAPS, Take 5,000 Units by mouth daily., Disp: , Rfl:    clonazePAM (KLONOPIN) 1 MG tablet, TAKE 1 TABLET BY MOUTH UP TO TWICE DAILY AS NEEDED FOR ANXIETY, Disp: 60 tablet, Rfl: 3   dapagliflozin propanediol (FARXIGA) 10 MG TABS tablet, Take 1 tablet (10 mg total) by mouth daily before breakfast., Disp: 90 tablet, Rfl: 3   diphenoxylate-atropine (LOMOTIL) 2.5-0.025 MG tablet, Take 1 tablet by mouth 4 (four) times daily as needed for diarrhea or loose stools. (Patient not taking: Reported on 08/22/2023), Disp: 120 tablet, Rfl: 2   escitalopram (LEXAPRO) 10 MG tablet, TAKE 1 TABLET BY MOUTH EVERY MORNING, Disp: 90 tablet, Rfl: 1   esomeprazole (NEXIUM) 40 MG capsule, Take 1 capsule (40 mg total) by mouth daily., Disp: 90 capsule, Rfl: 1   Fe Fum-FA-B Cmp-C-Zn-Mg-Mn-Cu (HEMOCYTE PLUS) 106-1 MG CAPS, Take 1 capsule by mouth daily., Disp: 90 capsule, Rfl: 1   furosemide (LASIX) 20 MG tablet, Take 2 tablets (40 mg total) by  mouth daily., Disp: 90 tablet, Rfl: 3   insulin aspart (NOVOLOG) 100 UNIT/ML injection, Inject 4 Units into the skin 3 (three) times daily as needed for high blood sugar (Bs greater than 200)., Disp: , Rfl:    Insulin Degludec (TRESIBA ), Inject 18 Units into the skin at bedtime., Disp: , Rfl:    Insulin Pen Needle (B-D UF III MINI PEN NEEDLES) 31G X 5 MM MISC, 1 Needle by Other route 2 (two) times daily., Disp: 100 each, Rfl: 5   levothyroxine (SYNTHROID) 75 MCG tablet, TAKE 1 TABLET BY MOUTH EVERY MORNING ON AN EMPTY STOMACH, Disp: 90 tablet, Rfl: 1   loperamide (IMODIUM) 2 MG capsule, Take 4 mg by mouth daily as needed for diarrhea or loose stools., Disp: , Rfl:    loratadine (CLARITIN) 10 MG tablet, Take 10 mg by mouth daily as needed for allergies., Disp: , Rfl:    meclizine (ANTIVERT) 25 MG tablet, Take 25 mg by mouth 3 (three) times daily as needed for dizziness (Migraine)., Disp: , Rfl:    metoprolol succinate (TOPROL-XL) 25 MG 24 hr tablet, Take 1 tablet (25 mg total) by mouth daily., Disp: 90 tablet, Rfl: 3   Multiple Vitamins-Minerals (MULTI ADULT GUMMIES) CHEW, Chew 1 tablet by mouth daily. Centrum, Disp: , Rfl:    ORENITRAM 0.25 MG TBCR, Take 0.75 mg by mouth See admin instructions. Take 0.75 mg at 10 am, 1600 and  2200 along with 1 mg.  This week the Nurse will call  and give her the dose for next week. (White Pill), Disp: , Rfl:    ORENITRAM 1 MG TBCR, Take 1 mg by mouth See admin instructions. Take 1 mg at 10 am, 1600 and 2200 for this week, The nurse will call and give her the dose for next week (Yellow Pill), Disp: , Rfl:    potassium chloride (KLOR-CON M) 20 MEQ tablet, Take 1 tablet (20 mEq total) by mouth daily., Disp: 30 tablet, Rfl: 3   rosuvastatin (CRESTOR) 10 MG tablet, TAKE 1 TABLET BY MOUTH EVERY DAY, Disp: 90 tablet, Rfl: 2   tadalafil, PAH, (ADCIRCA) 20 MG tablet, Take 2 tablets (40 mg total) by mouth daily., Disp: 60 tablet, Rfl: 11   Treprostinil Diolamine ER  (ORENITRAM) 0.125 MG TBCR, Take 0.125 mg by mouth 3 (three) times daily. (Blue Pill), Disp: , Rfl:    Treprostinil Diolamine ER (ORENITRAM) 2.5 MG TBCR, Take 2.5 mg by mouth 3 (three) times daily., Disp: , Rfl:   Past Medical History: Past Medical History:  Diagnosis Date   Abnormal electrocardiogram 11/25/2013   Formatting of this note might be different from the original. Last Assessment & Plan: Resolved when went to the ER.  Will f/u with Dr. Welton Flakes Note: Unchanged Formatting of this note might be different from the original. Note: Unchanged Formatting of this note might be different from the original.  Formatting of this note might be different from the original. Last Assessment & Plan: Resolved when we   Allergy    Anemia    Anxiety    Bilateral hand pain 07/25/2018   Cerebral infarction, unspecified (HCC) 01/26/2015   Chest pain 08/09/2016   Chest pain, rule out acute myocardial infarction 08/09/2016   Chronic diastolic heart failure (HCC)    COPD (chronic obstructive pulmonary disease) (HCC)    Depression    Diabetes (HCC)    Diarrhea, functional 01/25/2016   Edema of lower extremity 02/03/2020   Elevated troponin level 01/09/2020   Enlarged RV (right ventricle)    GERD (gastroesophageal reflux disease)    Hepatic steatosis    History of stroke 08/26/2022   Formatting of this note might be different from the original. Last Assessment & Plan:  Formatting of this note might be different from the original.  Continue ASA, statin     Hyperlipidemia    Hypertension    Hypokalemia 12/22/2015   Hypomagnesemia 03/22/2016   Insomnia    Macrocytosis 01/25/2016   Migraines    Nutmeg liver    Osteoporosis    Palpitations 11/25/2013   Formatting of this note might be different from the original. Note: Unchanged   Pulmonary hypertension (HCC)    [facility] spotted fever    Severe pulmonary arterial systolic hypertension (HCC)    Severe tricuspid regurgitation by prior  echocardiogram    Sleep apnea    CPAP   Transient cerebral ischemia 2016   Type 2 diabetes mellitus with hyperglycemia (HCC) 02/14/2012   Formatting of this note might be different from the original. Last Assessment & Plan: Hgb A1C is 7.1%.  Don't make any changes now.  Will hopefully increase exercise when pulmonary hypertension is properly treated.  ? Pulmonary rehab Note: Unchanged Formatting of this note might be different from the original. Formatting of this note might be different from the original.  Formatting of this note    Vertigo    every 2-3 months    Tobacco Use: Social History  Tobacco Use  Smoking Status Never  Smokeless Tobacco Never    Labs: Review Flowsheet  More data exists      Latest Ref Rng & Units 10/20/2022 01/20/2023 02/13/2023 08/14/2023 08/24/2023  Labs for ITP Cardiac and Pulmonary Rehab  Cholestrol 100 - 199 mg/dL 956  213  - - -  LDL (calc) 0 - 99 mg/dL 75  50  - - -  HDL-C >08 mg/dL 48  48  - - -  Trlycerides 0 - 149 mg/dL 657  846  - - -  Hemoglobin A1c 4.8 - 5.6 % 8.2  - 6.2     7.2  -  Bicarbonate 20.0 - 28.0 mmol/L 20.0 - 28.0 mmol/L - - - - 21.7  22.8   TCO2 22 - 32 mmol/L 22 - 32 mmol/L - - - - 23  24   Acid-base deficit 0.0 - 2.0 mmol/L 0.0 - 2.0 mmol/L - - - - 3.0  1.0   O2 Saturation % % - - - - 62  62     Details       This result is from an external source.   Multiple values from one day are sorted in reverse-chronological order          Pulmonary Assessment Scores:  Pulmonary Assessment Scores     Row Name 05/24/23 1238         ADL UCSD   ADL Phase Entry     SOB Score total 5     Rest 0     Walk 1     Stairs 0     Bath 0     Dress 0     Shop 1       CAT Score   CAT Score 4       mMRC Score   mMRC Score 3              UCSD: Self-administered rating of dyspnea associated with activities of daily living (ADLs) 6-point scale (0 = "not at all" to 5 = "maximal or unable to do because of breathlessness")   Scoring Scores range from 0 to 120.  Minimally important difference is 5 units  CAT: CAT can identify the health impairment of COPD patients and is better correlated with disease progression.  CAT has a scoring range of zero to 40. The CAT score is classified into four groups of low (less than 10), medium (10 - 20), high (21-30) and very high (31-40) based on the impact level of disease on health status. A CAT score over 10 suggests significant symptoms.  A worsening CAT score could be explained by an exacerbation, poor medication adherence, poor inhaler technique, or progression of COPD or comorbid conditions.  CAT MCID is 2 points  mMRC: mMRC (Modified Medical Research Council) Dyspnea Scale is used to assess the degree of baseline functional disability in patients of respiratory disease due to dyspnea. No minimal important difference is established. A decrease in score of 1 point or greater is considered a positive change.   Pulmonary Function Assessment:   Exercise Target Goals: Exercise Program Goal: Individual exercise prescription set using results from initial 6 min walk test and THRR while considering  patient's activity barriers and safety.   Exercise Prescription Goal: Initial exercise prescription builds to 30-45 minutes a day of aerobic activity, 2-3 days per week.  Home exercise guidelines will be given to patient during program as part of exercise prescription that the participant will acknowledge.  Education: Aerobic Exercise: - Group verbal and visual presentation on the components of exercise prescription. Introduces F.I.T.T principle from ACSM for exercise prescriptions.  Reviews F.I.T.T. principles of aerobic exercise including progression. Written material given at graduation. Flowsheet Row Pulmonary Rehab from 05/24/2023 in [facility] Cardiac and Pulmonary Rehab  Education need identified 05/24/23       Education: Resistance Exercise: - Group verbal and visual  presentation on the components of exercise prescription. Introduces F.I.T.T principle from ACSM for exercise prescriptions  Reviews F.I.T.T. principles of resistance exercise including progression. Written material given at graduation. Flowsheet Row Pulmonary Rehab from 12/03/2020 in [facility] Cardiac and Pulmonary Rehab  Date 12/03/20  Educator [facility]  Instruction Review Code 1- Verbalizes Understanding        Education: Exercise & Equipment Safety: - Individual verbal instruction and demonstration of equipment use and safety with use of the equipment. Flowsheet Row Pulmonary Rehab from 05/24/2023 in [facility] Cardiac and Pulmonary Rehab  Date 05/24/23  Educator [facility]  Instruction Review Code 1- Verbalizes Understanding       Education: Exercise Physiology & General Exercise Guidelines: - Group verbal and written instruction with models to review the exercise physiology of the cardiovascular system and associated critical values. Provides general exercise guidelines with specific guidelines to those with heart or lung disease.  Flowsheet Row Pulmonary Rehab from 05/24/2023 in [facility] Cardiac and Pulmonary Rehab  Education need identified 05/24/23       Education: Flexibility, Balance, Mind/Body Relaxation: - Group verbal and visual presentation with interactive activity on the components of exercise prescription. Introduces F.I.T.T principle from ACSM for exercise prescriptions. Reviews F.I.T.T. principles of flexibility and balance exercise training including progression. Also discusses the mind body connection.  Reviews various relaxation techniques to help reduce and manage stress (i.e. Deep breathing, progressive muscle relaxation, and visualization). Balance handout provided to take home. Written material given at graduation. Flowsheet Row Pulmonary Rehab from 12/03/2020 in [facility] Cardiac and Pulmonary Rehab  Date 10/08/20  Educator [facility]  Instruction Review Code 1- Verbalizes Understanding        Activity Barriers & Risk Stratification:  Activity Barriers & Cardiac Risk Stratification - 05/24/23 1227       Activity Barriers & Cardiac Risk Stratification   Activity Barriers Back Problems;Muscular Weakness;Assistive Device;Balance Concerns             6 Minute Walk:  6 Minute Walk     Row Name 05/24/23 1221         6 Minute Walk   Phase Initial     Distance 900 feet     Walk Time 6 minutes     # of Rest Breaks 0     MPH 1.7     METS 2.19     RPE 11     Perceived Dyspnea  0     VO2 Peak 7.65     Symptoms No     Resting HR 64 bpm     Resting BP 138/82     Resting Oxygen Saturation  98 %     Exercise Oxygen Saturation  during 6 min walk 96 %     Max Ex. HR 109 bpm     Max Ex. BP 142/80     2 Minute Post BP 130/60       Interval HR   1 Minute HR 80     2 Minute HR 93     3 Minute HR 100     4 Minute HR 103  5 Minute HR 106     6 Minute HR 109     2 Minute Post HR 65     Interval Heart Rate? Yes       Interval Oxygen   Interval Oxygen? Yes     Baseline Oxygen Saturation % 98 %     1 Minute Oxygen Saturation % 96 %     1 Minute Liters of Oxygen 0 L     2 Minute Oxygen Saturation % 96 %     2 Minute Liters of Oxygen 0 L     3 Minute Oxygen Saturation % 98 %     3 Minute Liters of Oxygen 0 L     4 Minute Oxygen Saturation % 98 %     4 Minute Liters of Oxygen 0 L     5 Minute Oxygen Saturation % 99 %     5 Minute Liters of Oxygen 0 L     6 Minute Oxygen Saturation % 97 %     6 Minute Liters of Oxygen 0 L     2 Minute Post Oxygen Saturation % 98 %     2 Minute Post Liters of Oxygen 0 L             Oxygen Initial Assessment:  Oxygen Initial Assessment - 05/24/23 1238       Home Oxygen   Home Oxygen Device None    Sleep Oxygen Prescription CPAP    Home Exercise Oxygen Prescription None    Home Resting Oxygen Prescription None    Compliance with Home Oxygen Use No      Initial 6 min Walk   Oxygen Used None      Program Oxygen  Prescription   Program Oxygen Prescription None      Intervention   Short Term Goals To learn and understand importance of monitoring SPO2 with pulse oximeter and demonstrate accurate use of the pulse oximeter.;To learn and understand importance of maintaining oxygen saturations>88%;To learn and demonstrate proper pursed lip breathing techniques or other breathing techniques. ;To learn and demonstrate proper use of respiratory medications    Long  Term Goals Verbalizes importance of monitoring SPO2 with pulse oximeter and return demonstration;Exhibits proper breathing techniques, such as pursed lip breathing or other method taught during program session;Maintenance of O2 saturations>88%;Compliance with respiratory medication             Oxygen Re-Evaluation:  Oxygen Re-Evaluation     Row Name 06/14/23 1342 07/03/23 1412           Program Oxygen Prescription   Program Oxygen Prescription -- None        Home Oxygen   Home Oxygen Device -- None      Sleep Oxygen Prescription -- CPAP      Liters per minute -- 0      Home Exercise Oxygen Prescription -- None      Home Resting Oxygen Prescription -- None      Compliance with Home Oxygen Use -- No        Goals/Expected Outcomes   Short Term Goals -- To learn and demonstrate proper pursed lip breathing techniques or other breathing techniques.       Long  Term Goals -- Exhibits proper breathing techniques, such as pursed lip breathing or other method taught during program session      Comments Reviewed PLB technique with pt.  Talked about how it works and it's importance in maintaining their exercise saturations.  Informed patient how to perform the Pursed Lipped breathing technique. Told patient to Inhale through the nose and out the mouth with pursed lips to keep their airways open, help oxygenate them better, practice when at rest or doing strenuous activity. Patient Verbalizes understanding of technique and will work on and be  reiterated during [facility].      Goals/Expected Outcomes Short: Become more profiecient at using PLB.   Long: Become independent at using PLB. Short: use PLB with exertion. Long: use PLB on exertion proficiently and independently.               Oxygen Discharge (Final Oxygen Re-Evaluation):  Oxygen Re-Evaluation - 07/03/23 1412       Program Oxygen Prescription   Program Oxygen Prescription None      Home Oxygen   Home Oxygen Device None    Sleep Oxygen Prescription CPAP    Liters per minute 0    Home Exercise Oxygen Prescription None    Home Resting Oxygen Prescription None    Compliance with Home Oxygen Use No      Goals/Expected Outcomes   Short Term Goals To learn and demonstrate proper pursed lip breathing techniques or other breathing techniques.     Long  Term Goals Exhibits proper breathing techniques, such as pursed lip breathing or other method taught during program session    Comments Informed patient how to perform the Pursed Lipped breathing technique. Told patient to Inhale through the nose and out the mouth with pursed lips to keep their airways open, help oxygenate them better, practice when at rest or doing strenuous activity. Patient Verbalizes understanding of technique and will work on and be reiterated during [facility].    Goals/Expected Outcomes Short: use PLB with exertion. Long: use PLB on exertion proficiently and independently.             Initial Exercise Prescription:  Initial Exercise Prescription - 05/24/23 1200       Date of Initial Exercise RX and Referring Provider   Date 05/24/23    Referring Provider Dr. Marca Ancona      Oxygen   Maintain Oxygen Saturation 88% or higher      Recumbant Bike   Level 1    RPM 50    Watts 15    Minutes 15    METs 2.19      NuStep   Level 1    SPM 80    Minutes 15    METs 2.19      REL-XR   Level 1    Speed 50    Minutes 15    METs 2.19      Biostep-RELP   Level 1    SPM 50     Minutes 15    METs 2.19      Track   Laps 22    Minutes 15    METs 2.2      Prescription Details   Frequency (times per week) 2    Duration Progress to 30 minutes of continuous aerobic without signs/symptoms of physical distress      Intensity   THRR 40-80% of Max Heartrate 98-128    Ratings of Perceived Exertion 11-13    Perceived Dyspnea 0-4      Progression   Progression Continue to progress workloads to maintain intensity without signs/symptoms of physical distress.      Resistance Training   Training Prescription Yes    Weight 2    Reps 10-15  Perform Capillary Blood Glucose checks as needed.  Exercise Prescription Changes:   Exercise Prescription Changes     Row Name 05/24/23 1200 06/28/23 0800 07/11/23 0700 07/12/23 1400 07/26/23 1100     Response to Exercise   Blood Pressure (Admit) 138/82 124/62 128/74 -- 142/78   Blood Pressure (Exercise) 142/80 160/62 148/72 -- 148/78   Blood Pressure (Exit) 130/60 110/62 124/80 -- 138/82   Heart Rate (Admit) 64 bpm 73 bpm 64 bpm -- 100 bpm   Heart Rate (Exercise) 109 bpm 114 bpm 93 bpm -- 124 bpm   Heart Rate (Exit) 65 bpm 70 bpm 70 bpm -- 91 bpm   Oxygen Saturation (Admit) 98 % 95 % 96 % -- 96 %   Oxygen Saturation (Exercise) 96 % 89 % 96 % -- 94 %   Oxygen Saturation (Exit) 98 % 95 % 97 % -- 95 %   Rating of Perceived Exertion (Exercise) 11 11 11  -- 13   Perceived Dyspnea (Exercise) 0 0 0 -- 1   Symptoms back pain 1/10 relieved with rest -- none -- none   Comments 6 MWT results First two weeks of exercise -- -- --   Duration -- Progress to 30 minutes of  aerobic without signs/symptoms of physical distress Progress to 30 minutes of  aerobic without signs/symptoms of physical distress Progress to 30 minutes of  aerobic without signs/symptoms of physical distress Progress to 30 minutes of  aerobic without signs/symptoms of physical distress   Intensity -- THRR unchanged THRR unchanged THRR unchanged THRR  unchanged     Progression   Progression -- Continue to progress workloads to maintain intensity without signs/symptoms of physical distress. Continue to progress workloads to maintain intensity without signs/symptoms of physical distress. Continue to progress workloads to maintain intensity without signs/symptoms of physical distress. Continue to progress workloads to maintain intensity without signs/symptoms of physical distress.   Average METs -- 2 1.64 1.64 1.85     Resistance Training   Training Prescription -- Yes Yes Yes Yes   Weight -- 2lb 2lb 2lb 2lb   Reps -- 10-15 10-15 10-15 10-15     Interval Training   Interval Training -- No No No No     Treadmill   MPH -- -- -- -- 0.7   Grade -- -- -- -- 0   Minutes -- -- -- -- 15   METs -- -- -- -- 1.54     NuStep   Level -- 2 2 2 2    Minutes -- 15 15 15 15    METs -- 2.2 2.3 2.3 2     T5 Nustep   Level -- 2  T6 -- -- --   Minutes -- 15 -- -- --   METs -- 1.8 -- -- --     Biostep-RELP   Level -- -- 2 2 2    Minutes -- -- 15 15 15    METs -- -- 1 1 2      Track   Laps -- -- 5 5 16    Minutes -- -- 15 15 15    METs -- -- 1.27 1.27 1.87     Home Exercise Plan   Plans to continue exercise at -- -- -- Home (comment)  chair exercises and walking Home (comment)  chair exercises and walking   Frequency -- -- -- Add 2 additional days to program exercise sessions. Add 2 additional days to program exercise sessions.   Initial Home Exercises Provided -- -- -- 07/12/23 07/12/23  Oxygen   Maintain Oxygen Saturation -- 88% or higher 88% or higher 88% or higher 88% or higher    Row Name 08/10/23 1600             Response to Exercise   Blood Pressure (Admit) 122/82       Blood Pressure (Exercise) 142/80       Blood Pressure (Exit) 118/62       Heart Rate (Admit) 95 bpm       Heart Rate (Exercise) 96 bpm       Heart Rate (Exit) 83 bpm       Oxygen Saturation (Admit) 96 %       Oxygen Saturation (Exercise) 95 %       Oxygen  Saturation (Exit) 95 %       Rating of Perceived Exertion (Exercise) 15       Perceived Dyspnea (Exercise) 1       Symptoms none       Duration Progress to 30 minutes of  aerobic without signs/symptoms of physical distress       Intensity THRR unchanged         Progression   Progression Continue to progress workloads to maintain intensity without signs/symptoms of physical distress.       Average METs 1.99         Resistance Training   Training Prescription Yes       Weight 2 lb       Reps 10-15         Interval Training   Interval Training No         Recumbant Bike   Level 1       Watts 15       Minutes 15       METs 2.67         NuStep   Level 2       Minutes 15       METs 1.3         Home Exercise Plan   Plans to continue exercise at Home (comment)  chair exercises and walking       Frequency Add 2 additional days to program exercise sessions.       Initial Home Exercises Provided 07/12/23         Oxygen   Maintain Oxygen Saturation 88% or higher                Exercise Comments:   Exercise Comments     Row Name 06/14/23 1340           Exercise Comments First full day of exercise!  Patient was oriented to gym and equipment including functions, settings, policies, and procedures.  Patient's individual exercise prescription and treatment plan were reviewed.  All starting workloads were established based on the results of the 6 minute walk test done at initial orientation visit.  The plan for exercise progression was also introduced and progression will be customized based on patient's performance and goals.                Exercise Goals and Review:   Exercise Goals     Row Name 05/24/23 1231             Exercise Goals   Increase Physical Activity Yes       Intervention Provide advice, education, support and counseling about physical activity/exercise needs.;Develop an individualized exercise prescription for aerobic and resistive training  based on initial evaluation  findings, risk stratification, comorbidities and participant's personal goals.       Expected Outcomes Short Term: Attend rehab on a regular basis to increase amount of physical activity.;Long Term: Add in home exercise to make exercise part of routine and to increase amount of physical activity.;Long Term: Exercising regularly at least 3-5 days a week.       Increase Strength and Stamina Yes       Intervention Provide advice, education, support and counseling about physical activity/exercise needs.;Develop an individualized exercise prescription for aerobic and resistive training based on initial evaluation findings, risk stratification, comorbidities and participant's personal goals.       Expected Outcomes Short Term: Increase workloads from initial exercise prescription for resistance, speed, and METs.;Short Term: Perform resistance training exercises routinely during rehab and add in resistance training at home;Long Term: Improve cardiorespiratory fitness, muscular endurance and strength as measured by increased METs and functional capacity ( )       Able to understand and use rate of perceived exertion (RPE) scale Yes       Intervention Provide education and explanation on how to use RPE scale       Expected Outcomes Short Term: Able to use RPE daily in rehab to express subjective intensity level;Long Term:  Able to use RPE to guide intensity level when exercising independently       Able to understand and use Dyspnea scale Yes       Intervention Provide education and explanation on how to use Dyspnea scale       Expected Outcomes Short Term: Able to use Dyspnea scale daily in rehab to express subjective sense of shortness of breath during exertion;Long Term: Able to use Dyspnea scale to guide intensity level when exercising independently       Knowledge and understanding of Target Heart Rate Range (THRR) Yes       Intervention Provide education and explanation of  THRR including how the numbers were predicted and where they are located for reference       Expected Outcomes Short Term: Able to state/look up THRR;Long Term: Able to use THRR to govern intensity when exercising independently;Short Term: Able to use daily as guideline for intensity in rehab       Able to check pulse independently Yes       Intervention Provide education and demonstration on how to check pulse in carotid and radial arteries.;Review the importance of being able to check your own pulse for safety during independent exercise       Expected Outcomes Short Term: Able to explain why pulse checking is important during independent exercise;Long Term: Able to check pulse independently and accurately       Understanding of Exercise Prescription Yes       Intervention Provide education, explanation, and written materials on patient's individual exercise prescription       Expected Outcomes Short Term: Able to explain program exercise prescription;Long Term: Able to explain home exercise prescription to exercise independently                Exercise Goals Re-Evaluation :  Exercise Goals Re-Evaluation     Row Name 06/14/23 1341 06/28/23 0816 07/11/23 0745 07/12/23 1427 07/26/23 1103     Exercise Goal Re-Evaluation   Exercise Goals Review Increase Physical Activity;Able to understand and use rate of perceived exertion (RPE) scale;Knowledge and understanding of Target Heart Rate Range (THRR);Understanding of Exercise Prescription;Increase Strength and Stamina;Able to understand and use Dyspnea scale;Able to check pulse  independently Increase Physical Activity;Increase Strength and Stamina;Understanding of Exercise Prescription Increase Physical Activity;Increase Strength and Stamina;Understanding of Exercise Prescription Able to understand and use rate of perceived exertion (RPE) scale;Increase Physical Activity;Knowledge and understanding of Target Heart Rate Range (THRR);Understanding of  Exercise Prescription;Increase Strength and Stamina;Able to understand and use Dyspnea scale;Able to check pulse independently Increase Physical Activity;Increase Strength and Stamina;Understanding of Exercise Prescription   Comments Reviewed RPE and dyspnea scale, THR and program prescription with pt today.  Pt voiced understanding and was given a copy of goals to take home. Jamie Murray is off to a good start in the program. She was able to use both the T4 and T6 nustep at an intensity of level 2. We will try to incorporate new exercise machines to her, in order to diversify her prescription. We will continue to monitor her progress in the program. Jamie Murray is doing well in rehab. She was able to maintain a workload of level 2 on the T4 nustep as well as increase to level 2 on the biostep. We will continue to monitor and encourage her progression in the program. Reviewed home exercise with pt today from 2:15 pm to 2:26pm.  Pt plans to do chair exercises and walk  for exercise.  Reviewed THR, pulse, RPE, sign and symptoms, pulse oximetery and when to call 911 or MD.  Also discussed weather considerations and indoor options.  Pt voiced understanding. Jamie Murray is doing well in rehab. She was able to increase her track laps from 5 to 16 in 15 minutes. She was also able to add the treadmill to her current exercise prescription at a speed of 0.7 and zero incline. We will continue to monitor her progress in the program.   Expected Outcomes Short: Use RPE daily to regulate intensity.  Long: Follow program prescription in THR. Short: Continue to follow current exercise prescription. Long: Continue exercise to improve strength and stamina. Short: Continue to increase workloads, push for more laps on the track. Long: Continue exercise to improvre strength and stamina. Short: add 1-2 days a week of home exercise on off days of rehab. Plans to do chair exercises and walk. Long: maintain independent exercise routine upon graduation from  pulmonary rehab. Short: Continue to use treadmill, increase track laps. Long: Continue exercise to improve strength and stamina.    Row Name 08/10/23 1619 08/22/23 1431           Exercise Goal Re-Evaluation   Exercise Goals Review Increase Physical Activity;Increase Strength and Stamina;Understanding of Exercise Prescription Increase Physical Activity;Increase Strength and Stamina;Understanding of Exercise Prescription      Comments Jamie Murray has only attended one session since the last review. She continues to work at level 2 on the T4 nustep and level 1 on the recumbent bike. She recently called to inform us that she will be out of rehab on medical hold due to getting a heart cath on 08/24/2023. We will continue to monitor her progress when she returns. Jamie Murray is on a medical hold due to getting a heart cath on 08/24/2023. We will continue to monitor her progress when she returns.      Expected Outcomes Short: Return to rehab when appropriate. Long: Continue exercise to improve strength and stamina. Short: Return to rehab when appropriate. Long: Continue exercise to improve strength and stamina.               Discharge Exercise Prescription (Final Exercise Prescription Changes):  Exercise Prescription Changes - 08/10/23 1600  Response to Exercise   Blood Pressure (Admit) 122/82    Blood Pressure (Exercise) 142/80    Blood Pressure (Exit) 118/62    Heart Rate (Admit) 95 bpm    Heart Rate (Exercise) 96 bpm    Heart Rate (Exit) 83 bpm    Oxygen Saturation (Admit) 96 %    Oxygen Saturation (Exercise) 95 %    Oxygen Saturation (Exit) 95 %    Rating of Perceived Exertion (Exercise) 15    Perceived Dyspnea (Exercise) 1    Symptoms none    Duration Progress to 30 minutes of  aerobic without signs/symptoms of physical distress    Intensity THRR unchanged      Progression   Progression Continue to progress workloads to maintain intensity without signs/symptoms of physical distress.     Average METs 1.99      Resistance Training   Training Prescription Yes    Weight 2 lb    Reps 10-15      Interval Training   Interval Training No      Recumbant Bike   Level 1    Watts 15    Minutes 15    METs 2.67      NuStep   Level 2    Minutes 15    METs 1.3      Home Exercise Plan   Plans to continue exercise at Home (comment)   chair exercises and walking   Frequency Add 2 additional days to program exercise sessions.    Initial Home Exercises Provided 07/12/23      Oxygen   Maintain Oxygen Saturation 88% or higher             Nutrition:  Target Goals: Understanding of nutrition guidelines, daily intake of sodium 1500mg , cholesterol 200mg , calories 30% from fat and 7% or less from saturated fats, daily to have 5 or more servings of fruits and vegetables.  Education: All About Nutrition: -Group instruction provided by verbal, written material, interactive activities, discussions, models, and posters to present general guidelines for heart healthy nutrition including fat, fiber, MyPlate, the role of sodium in heart healthy nutrition, utilization of the nutrition label, and utilization of this knowledge for meal planning. Follow up email sent as well. Written material given at graduation. Flowsheet Row Pulmonary Rehab from 12/03/2020 in [facility] Cardiac and Pulmonary Rehab  Date 10/15/20  Educator [facility]  Instruction Review Code 1- Verbalizes Understanding       Biometrics:  Pre Biometrics - 05/24/23 1231       Pre Biometrics   Height 5\' 7"  (1.702 m)    Weight 160 lb 8 oz (72.8 kg)    Waist Circumference 37.5 inches    Hip Circumference 40 inches    Waist to Hip Ratio 0.94 %    BMI (Calculated) 25.13    Single Leg Stand 0 seconds   attempted but was not able to stand on one foot.             Nutrition Therapy Plan and Nutrition Goals:  Nutrition Therapy & Goals - 05/24/23 1233       Nutrition Therapy   RD appointment deferred Yes              Nutrition Assessments:  MEDIFICTS Score Key: >=70 Need to make dietary changes  40-70 Heart Healthy Diet <= 40 Therapeutic Level Cholesterol Diet  Flowsheet Row Pulmonary Rehab from 05/24/2023 in [facility] Cardiac and Pulmonary Rehab  Picture Your Plate Total Score on Admission 61  Picture Your Plate Scores: <95 Unhealthy dietary pattern with much room for improvement. 41-50 Dietary pattern unlikely to meet recommendations for good health and room for improvement. 51-60 More healthful dietary pattern, with some room for improvement.  >60 Healthy dietary pattern, although there may be some specific behaviors that could be improved.   Nutrition Goals Re-Evaluation:  Nutrition Goals Re-Evaluation     Row Name 07/03/23 1414 07/24/23 1428           Goals   Comment Patient was informed on why it is important to maintain a balanced diet when dealing with Respiratory issues. Explained that it takes a lot of energy to breath and when they are short of breath often they will need to have a good diet to help keep up with the calories they are expending for breathing. Spoke with patient about scheduling RD appointment.      Expected Outcome Short: Choose and plan snacks accordingly to patients caloric intake to improve breathing. Long: Maintain a diet independently that meets their caloric intake to aid in daily shortness of breath. STG: meet with RD on 2/24. LTG: maintain diet independently that meets caloiric needs and aids in SOB               Nutrition Goals Discharge (Final Nutrition Goals Re-Evaluation):  Nutrition Goals Re-Evaluation - 07/24/23 1428       Goals   Comment Spoke with patient about scheduling RD appointment.    Expected Outcome STG: meet with RD on 2/24. LTG: maintain diet independently that meets caloiric needs and aids in SOB             Psychosocial: Target Goals: Acknowledge presence or absence of significant depression and/or stress, maximize  coping skills, provide positive support system. Participant is able to verbalize types and ability to use techniques and skills needed for reducing stress and depression.   Education: Stress, Anxiety, and Depression - Group verbal and visual presentation to define topics covered.  Reviews how body is impacted by stress, anxiety, and depression.  Also discusses healthy ways to reduce stress and to treat/manage anxiety and depression.  Written material given at graduation. Flowsheet Row Pulmonary Rehab from 12/03/2020 in [facility] Cardiac and Pulmonary Rehab  Date 11/12/20  Educator [facility]  Instruction Review Code 1- [employer] Understanding       Education: Sleep Hygiene -Provides group verbal and written instruction about how sleep can affect your health.  Define sleep hygiene, discuss sleep cycles and impact of sleep habits. Review good sleep hygiene tips.    Initial Review & Psychosocial Screening:  Initial Psych Review & Screening - 05/23/23 1307       Initial Review   Current issues with Current Sleep Concerns;Current Depression;History of Depression;Current Stress Concerns      Family Dynamics   Good Support System? Yes      Barriers   Psychosocial barriers to participate in program The patient should benefit from training in stress management and relaxation.      Screening Interventions   Interventions Encouraged to exercise;Provide feedback about the scores to participant;To provide support and resources with identified psychosocial needs    Expected Outcomes Short Term goal: Utilizing psychosocial counselor, staff and physician to assist with identification of specific Stressors or current issues interfering with healing process. Setting desired goal for each stressor or current issue identified.;Long Term Goal: Stressors or current issues are controlled or eliminated.;Short Term goal: Identification and review with participant of any Quality of Life or Depression  concerns found by  scoring the questionnaire.;Long Term goal: The participant improves quality of Life and PHQ9 Scores as seen by post scores and/or verbalization of changes             Quality of Life Scores:  Scores of 19 and below usually indicate a poorer quality of life in these areas.  A difference of  2-3 points is a clinically meaningful difference.  A difference of 2-3 points in the total score of the Quality of Life Index has been associated with significant improvement in overall quality of life, self-image, physical symptoms, and general health in studies assessing change in quality of life.  PHQ-9: Review Flowsheet  More data exists      07/03/2023 05/24/2023 05/15/2023 12/01/2020 11/17/2020  Depression screen PHQ 2/9  Decreased Interest 3 3 0 0 1  Down, Depressed, Hopeless 2 2 1  0 0  PHQ - 2 Score 5 5 1  0 1  Altered sleeping 3 3 - 0 1  Tired, decreased energy 1 2 - 1 1  Change in appetite 3 0 - 0 0  Feeling bad or failure about yourself  2 1 - 0 0  Trouble concentrating 0 0 - 0 0  Moving slowly or fidgety/restless 0 0 - 0 0  Suicidal thoughts 0 0 - 0 0  PHQ-9 Score 14 11 - 1 3  Difficult doing work/chores Somewhat difficult Somewhat difficult - Not difficult at all Not difficult at all   Interpretation of Total Score  Total Score Depression Severity:  1-4 = Minimal depression, 5-9 = Mild depression, 10-14 = Moderate depression, 15-19 = Moderately severe depression, 20-27 = Severe depression   Psychosocial Evaluation and Intervention:  Psychosocial Evaluation - 05/23/23 1314       Psychosocial Evaluation & Interventions   Interventions Encouraged to exercise with the program and follow exercise prescription;Stress management education;Relaxation education    Comments Jamie Murray it coming to pulmonary rehab wtih pulmonary htn. She has done the program before and knows what to expect. She states she really doesn't want to come, but knows she needs to take care of herself so she is going to  try. She is still recovering from losing her husband September of last year and the financial and emotional stress she was left with. She has been seeing a therapist which has helped her a lot so she has someone she can "vent to." Shailene states she has been sleeping well with her medicine. She feels like she is ready to move forward in managing her health.    Expected Outcomes Short: attend pulmonary rehab for education and exercise. Long: develop positive self care habits.    Continue Psychosocial Services  Follow up required by staff             Psychosocial Re-Evaluation:  Psychosocial Re-Evaluation     Row Name 07/03/23 1422             Psychosocial Re-Evaluation   Current issues with Current Stress Concerns;Current Sleep Concerns       Comments Reviewed patient health questionnaire (PHQ-9) with patient for follow up. Previously, patients score indicated signs/symptoms of depression.  Reviewed to see if patient is improving symptom wise while in program.  Score declined and patient states that it is because she has a diagnosis that is causing her to go to the Cancer center. She states she sleeps good but gets tired easily and sleeps to much.       Expected Outcomes Short: Continue  to work toward an improvement in PHQ9 scores by attending [facility] regularly. Long: Continue to improve stress and depression coping skills by talking with staff and attending [facility] regularly and work toward a positive mental state.       Interventions Encouraged to attend Pulmonary Rehabilitation for the exercise       Continue Psychosocial Services  Follow up required by staff         Initial Review   Source of Stress Concerns Chronic Illness;Family                Psychosocial Discharge (Final Psychosocial Re-Evaluation):  Psychosocial Re-Evaluation - 07/03/23 1422       Psychosocial Re-Evaluation   Current issues with Current Stress Concerns;Current Sleep Concerns    Comments Reviewed  patient health questionnaire (PHQ-9) with patient for follow up. Previously, patients score indicated signs/symptoms of depression.  Reviewed to see if patient is improving symptom wise while in program.  Score declined and patient states that it is because she has a diagnosis that is causing her to go to the Cancer center. She states she sleeps good but gets tired easily and sleeps to much.    Expected Outcomes Short: Continue to work toward an improvement in PHQ9 scores by attending [facility] regularly. Long: Continue to improve stress and depression coping skills by talking with staff and attending [facility] regularly and work toward a positive mental state.    Interventions Encouraged to attend Pulmonary Rehabilitation for the exercise    Continue Psychosocial Services  Follow up required by staff      Initial Review   Source of Stress Concerns Chronic Illness;Family             Education: Education Goals: Education classes will be provided on a weekly basis, covering required topics. Participant will state understanding/return demonstration of topics presented.  Learning Barriers/Preferences:  Learning Barriers/Preferences - 05/23/23 1307       Learning Barriers/Preferences   Learning Barriers None    Learning Preferences None             General Pulmonary Education Topics:  Infection Prevention: - Provides verbal and written material to individual with discussion of infection control including proper hand washing and proper equipment cleaning during exercise session. Flowsheet Row Pulmonary Rehab from 05/24/2023 in [facility] Cardiac and Pulmonary Rehab  Date 05/24/23  Educator [facility]  Instruction Review Code 1- Verbalizes Understanding       Falls Prevention: - Provides verbal and written material to individual with discussion of falls prevention and safety. Flowsheet Row Pulmonary Rehab from 05/24/2023 in [facility] Cardiac and Pulmonary Rehab  Date 05/24/23  Educator [facility]   Instruction Review Code 1- Verbalizes Understanding       Chronic Lung Disease Review: - Group verbal instruction with posters, models, PowerPoint presentations and videos,  to review new updates, new respiratory medications, new advancements in procedures and treatments. Providing information on websites and "800" numbers for continued self-education. Includes information about supplement oxygen, available portable oxygen systems, continuous and intermittent flow rates, oxygen safety, concentrators, and Medicare reimbursement for oxygen. Explanation of Pulmonary Drugs, including class, frequency, complications, importance of spacers, rinsing mouth after steroid MDI's, and proper cleaning methods for nebulizers. Review of basic lung anatomy and physiology related to function, structure, and complications of lung disease. Review of risk factors. Discussion about methods for diagnosing sleep apnea and types of masks and machines for OSA. Includes a review of the use of types of environmental controls: home humidity, furnaces,  filters, dust mite/pet prevention, HEPA vacuums. Discussion about weather changes, air quality and the benefits of nasal washing. Instruction on Warning signs, infection symptoms, calling MD promptly, preventive modes, and value of vaccinations. Review of effective airway clearance, coughing and/or vibration techniques. Emphasizing that all should Create an Action Plan. Written material given at graduation. Flowsheet Row Pulmonary Rehab from 05/24/2023 in [facility] Cardiac and Pulmonary Rehab  Education need identified 05/24/23       AED/CPR: - Group verbal and written instruction with the use of models to demonstrate the basic use of the AED with the basic ABC's of resuscitation.    Anatomy and Cardiac Procedures: - Group verbal and visual presentation and models provide information about basic cardiac anatomy and function. Reviews the testing methods done to diagnose heart  disease and the outcomes of the test results. Describes the treatment choices: Medical Management, Angioplasty, or Coronary Bypass Surgery for treating various heart conditions including Myocardial Infarction, Angina, Valve Disease, and Cardiac Arrhythmias.  Written material given at graduation. Flowsheet Row Pulmonary Rehab from 12/03/2020 in [facility] Cardiac and Pulmonary Rehab  Date 12/03/20  Educator [facility]  Instruction Review Code 1- Verbalizes Understanding       Medication Safety: - Group verbal and visual instruction to review commonly prescribed medications for heart and lung disease. Reviews the medication, class of the drug, and side effects. Includes the steps to properly store meds and maintain the prescription regimen.  Written material given at graduation. Flowsheet Row Pulmonary Rehab from 12/03/2020 in [facility] Cardiac and Pulmonary Rehab  Date 10/22/20  Educator [facility]  Instruction Review Code 1- Verbalizes Understanding       Other: -Provides group and verbal instruction on various topics (see comments)   Knowledge Questionnaire Score:  Knowledge Questionnaire Score - 05/24/23 1237       Knowledge Questionnaire Score   Pre Score 9/18              Core Components/Risk Factors/Patient Goals at Admission:  Personal Goals and Risk Factors at Admission - 05/24/23 1237       Core Components/Risk Factors/Patient Goals on Admission    Weight Management Yes;Weight Maintenance    Intervention Weight Management: Develop a combined nutrition and exercise program designed to reach desired caloric intake, while maintaining appropriate intake of nutrient and fiber, sodium and fats, and appropriate energy expenditure required for the weight goal.;Weight Management: Provide education and appropriate resources to help participant work on and attain dietary goals.;Weight Management/Obesity: Establish reasonable short term and long term weight goals.    Admit Weight 160 lb 8 oz (72.8 kg)     Goal Weight: Short Term 160 lb (72.6 kg)    Goal Weight: Long Term 160 lb (72.6 kg)    Expected Outcomes Short Term: Continue to assess and modify interventions until short term weight is achieved;Long Term: Adherence to nutrition and physical activity/exercise program aimed toward attainment of established weight goal;Weight Maintenance: Understanding of the daily nutrition guidelines, which includes 25-35% calories from fat, 7% or less cal from saturated fats, less than 200mg  cholesterol, less than 1.5gm of sodium, & 5 or more servings of fruits and vegetables daily;Understanding recommendations for meals to include 15-35% energy as protein, 25-35% energy from fat, 35-60% energy from carbohydrates, less than 200mg  of dietary cholesterol, 20-35 gm of total fiber daily;Understanding of distribution of calorie intake throughout the day with the consumption of 4-5 meals/snacks    Improve shortness of breath with ADL's Yes    Intervention Provide education, individualized  exercise plan and daily activity instruction to help decrease symptoms of SOB with activities of daily living.    Expected Outcomes Short Term: Improve cardiorespiratory fitness to achieve a reduction of symptoms when performing ADLs;Long Term: Be able to perform more ADLs without symptoms or delay the onset of symptoms    Diabetes Yes    Intervention Provide education about signs/symptoms and action to take for hypo/hyperglycemia.;Provide education about proper nutrition, including hydration, and aerobic/resistive exercise prescription along with prescribed medications to achieve blood glucose in normal ranges: Fasting glucose 65-99 mg/dL    Expected Outcomes Short Term: Participant verbalizes understanding of the signs/symptoms and immediate care of hyper/hypoglycemia, proper foot care and importance of medication, aerobic/resistive exercise and nutrition plan for blood glucose control.;Long Term: Attainment of HbA1C < 7%.    Heart Failure  Yes    Intervention Provide a combined exercise and nutrition program that is supplemented with education, support and counseling about heart failure. Directed toward relieving symptoms such as shortness of breath, decreased exercise tolerance, and extremity edema.    Expected Outcomes Improve functional capacity of life;Short term: Attendance in program 2-3 days a week with increased exercise capacity. Reported lower sodium intake. Reported increased fruit and vegetable intake. Reports medication compliance.;Short term: Daily weights obtained and reported for increase. Utilizing diuretic protocols set by physician.;Long term: Adoption of self-care skills and reduction of barriers for early signs and symptoms recognition and intervention leading to self-care maintenance.    Hypertension Yes    Intervention Provide education on lifestyle modifcations including regular physical activity/exercise, weight management, moderate sodium restriction and increased consumption of fresh fruit, vegetables, and low fat dairy, alcohol moderation, and smoking cessation.;Monitor prescription use compliance.    Expected Outcomes Short Term: Continued assessment and intervention until BP is < 140/107mm HG in hypertensive participants. < 130/8mm HG in hypertensive participants with diabetes, heart failure or chronic kidney disease.;Long Term: Maintenance of blood pressure at goal levels.    Lipids Yes    Intervention Provide education and support for participant on nutrition & aerobic/resistive exercise along with prescribed medications to achieve LDL 70mg , HDL >40mg .    Expected Outcomes Short Term: Participant states understanding of desired cholesterol values and is compliant with medications prescribed. Participant is following exercise prescription and nutrition guidelines.;Long Term: Cholesterol controlled with medications as prescribed, with individualized exercise RX and with personalized nutrition plan. Value goals:  LDL < 70mg , HDL > 40 mg.             Education:Diabetes - Individual verbal and written instruction to review signs/symptoms of diabetes, desired ranges of glucose level fasting, after meals and with exercise. Acknowledge that pre and post exercise glucose checks will be done for 3 sessions at entry of program. Flowsheet Row Pulmonary Rehab from 05/24/2023 in [facility]  Date 05/24/23  Educator [facility]  Instruction Review Code 1- Verbalizes Understanding       Know Your Numbers and Heart Failure: - Group verbal and visual instruction to discuss disease risk factors for cardiac and pulmonary disease and treatment options.  Reviews associated critical values for Overweight/Obesity, Hypertension, Cholesterol, and Diabetes.  Discusses basics of heart failure: signs/symptoms and treatments.  Introduces Heart Failure Zone chart for action plan for heart failure.  Written material given at graduation. Flowsheet Row Pulmonary Rehab from 12/03/2020 in [facility]  Date 10/29/20  Educator [facility]  Instruction Review Code 1- Verbalizes Understanding       Core Components/Risk Factors/Patient Goals Review:  Goals and Risk Factor Review     Row Name 07/03/23 1413             Core Components/Risk Factors/Patient Goals Review   Personal Goals Review Improve shortness of breath with ADL's       Review Spoke to patient about their shortness of breath and what they can do to improve. Patient has been informed of breathing techniques when starting the program. Patient is informed to tell staff if they have had any med changes and that certain meds they are taking or not taking can be causing shortness of breath.       Expected Outcomes Short: Attend [facility] regularly to improve shortness of breath with ADL's. Long: maintain independence with ADL's                Core Components/Risk Factors/Patient Goals at Discharge (Final Review):   Goals and Risk  Factor Review - 07/03/23 1413       Core Components/Risk Factors/Patient Goals Review   Personal Goals Review Improve shortness of breath with ADL's    Review Spoke to patient about their shortness of breath and what they can do to improve. Patient has been informed of breathing techniques when starting the program. Patient is informed to tell staff if they have had any med changes and that certain meds they are taking or not taking can be causing shortness of breath.    Expected Outcomes Short: Attend [facility] regularly to improve shortness of breath with ADL's. Long: maintain independence with ADL's             ITP Comments:  ITP Comments     Row Name 05/24/23 1213 06/14/23 1340 06/21/23 1259 07/19/23 0808 08/09/23 1248   ITP Comments Completed and gym orientation. Initial ITP created and sent for review to Dr. Jinny Sanders, Medical Director. First full day of exercise!  Patient was oriented to gym and equipment including functions, settings, policies, and procedures.  Patient's individual exercise prescription and treatment plan were reviewed.  All starting workloads were established based on the results of the 6 minute walk test done at initial orientation visit.  The plan for exercise progression was also introduced and progression will be customized based on patient's performance and goals. 30 Day review completed. Medical Director ITP review done, changes made as directed, and signed approval by Medical Director.    new to program 30 Day review completed. Medical Director ITP review done, changes made as directed, and signed approval by Medical Director. Daniela called to inform staff that she will be having a cath on 3/20. She will be placed on medical hold until then.    Row Name 08/16/23 1012 08/28/23 1310         ITP Comments 30 Day review completed. Medical Director ITP review done, changes made as directed, and signed approval by Medical Director. Alyah called to inform  staff that she would like to be discharged at this time due to lack of energy. She had a recent heart cath and they are going to start her on new medication for pulmonary hypertenstion. She does not feel like she can participate in the program at this time. She compelted 8 of 36 sessions.               Comments: Early Discharge ITP

## 2023-08-28 NOTE — Progress Notes (Signed)
 Early Discharge Summary   Jamie Murray  DOB: [birth year]  [name] called to inform staff that she would like to be discharged at this time due to lack of energy. She does not feel like she can participate in the program at this time. She completed 8 of 36 sessions.    6 Minute Walk     Row Name 05/24/23 1221         6 Minute Walk   Phase Initial     Distance 900 feet     Walk Time 6 minutes     # of Rest Breaks 0     MPH 1.7     METS 2.19     RPE 11     Perceived Dyspnea  0     VO2 Peak 7.65     Symptoms No     Resting HR 64 bpm     Resting BP 138/82     Resting Oxygen Saturation  98 %     Exercise Oxygen Saturation  during 6 min walk 96 %     Max Ex. HR 109 bpm     Max Ex. BP 142/80     2 Minute Post BP 130/60       Interval HR   1 Minute HR 80     2 Minute HR 93     3 Minute HR 100     4 Minute HR 103     5 Minute HR 106     6 Minute HR 109     2 Minute Post HR 65     Interval Heart Rate? Yes       Interval Oxygen   Interval Oxygen? Yes     Baseline Oxygen Saturation % 98 %     1 Minute Oxygen Saturation % 96 %     1 Minute Liters of Oxygen 0 L     2 Minute Oxygen Saturation % 96 %     2 Minute Liters of Oxygen 0 L     3 Minute Oxygen Saturation % 98 %     3 Minute Liters of Oxygen 0 L     4 Minute Oxygen Saturation % 98 %     4 Minute Liters of Oxygen 0 L     5 Minute Oxygen Saturation % 99 %     5 Minute Liters of Oxygen 0 L     6 Minute Oxygen Saturation % 97 %     6 Minute Liters of Oxygen 0 L     2 Minute Post Oxygen Saturation % 98 %     2 Minute Post Liters of Oxygen 0 L

## 2023-08-29 ENCOUNTER — Other Ambulatory Visit: Payer: Medicare PPO

## 2023-08-29 NOTE — Telephone Encounter (Signed)
 Sent in enrollment application to [employer] for [employer]. Requested [facility] as dispensing specialty pharmacy.  Application pending, will continue to follow.   Karle Plumber, PharmD, BCPS, BCCP, CPP Heart Failure Clinic Pharmacist [phone]

## 2023-08-31 ENCOUNTER — Ambulatory Visit: Payer: Medicare PPO

## 2023-08-31 ENCOUNTER — Ambulatory Visit: Payer: Medicare PPO | Admitting: Oncology

## 2023-08-31 ENCOUNTER — Other Ambulatory Visit (HOSPITAL_COMMUNITY): Payer: Self-pay | Admitting: Cardiology

## 2023-08-31 DIAGNOSIS — I270 Primary pulmonary hypertension: Secondary | ICD-10-CM

## 2023-08-31 MED ORDER — AMBRISENTAN 10 MG PO TABS: 10.0000 mg | ORAL_TABLET | Freq: Every day | ORAL | 3 refills | Status: AC

## 2023-09-01 ENCOUNTER — Telehealth: Payer: Self-pay | Admitting: Cardiology

## 2023-09-01 NOTE — Telephone Encounter (Incomplete)
 Called to confirm/remind patient of their appointment at the [facility] on 09/05/23***.   Appointment:   [x] Confirmed  [] Left mess   [] No answer/No voice mail  [] Phone not in service  Patient reminded to bring all medications and/or complete list.  Confirmed patient has transportation. Gave directions, instructed to utilize valet parking.

## 2023-09-04 ENCOUNTER — Other Ambulatory Visit: Payer: Self-pay | Admitting: Family

## 2023-09-05 ENCOUNTER — Telehealth: Payer: Self-pay | Admitting: Family

## 2023-09-05 ENCOUNTER — Encounter: Admitting: Cardiology

## 2023-09-05 NOTE — Telephone Encounter (Signed)
 Patient left VM that she needs to speak to [facility] about some [employer] equipment.

## 2023-09-07 ENCOUNTER — Telehealth: Payer: Self-pay | Admitting: Cardiology

## 2023-09-07 NOTE — Telephone Encounter (Incomplete)
 Called to confirm/remind patient of their appointment at the [facility] on 09/08/23***.   Appointment:   [x] Confirmed  [] Left mess   [] No answer/No voice mail  [] Phone not in service  Patient reminded to bring all medications and/or complete list.  Confirmed patient has transportation. Gave directions, instructed to utilize valet parking.

## 2023-09-08 ENCOUNTER — Ambulatory Visit: Attending: Cardiology | Admitting: Cardiology

## 2023-09-08 ENCOUNTER — Telehealth: Payer: Self-pay | Admitting: Pharmacist

## 2023-09-08 VITALS — BP 112/65 | HR 62 | Wt 156.0 lb

## 2023-09-08 DIAGNOSIS — G4733 Obstructive sleep apnea (adult) (pediatric): Secondary | ICD-10-CM | POA: Diagnosis not present

## 2023-09-08 DIAGNOSIS — Z79899 Other long term (current) drug therapy: Secondary | ICD-10-CM | POA: Insufficient documentation

## 2023-09-08 DIAGNOSIS — I2721 Secondary pulmonary arterial hypertension: Secondary | ICD-10-CM | POA: Diagnosis not present

## 2023-09-08 DIAGNOSIS — R700 Elevated erythrocyte sedimentation rate: Secondary | ICD-10-CM | POA: Diagnosis not present

## 2023-09-08 DIAGNOSIS — R7982 Elevated C-reactive protein (CRP): Secondary | ICD-10-CM | POA: Diagnosis not present

## 2023-09-08 DIAGNOSIS — E1122 Type 2 diabetes mellitus with diabetic chronic kidney disease: Secondary | ICD-10-CM | POA: Insufficient documentation

## 2023-09-08 DIAGNOSIS — I5032 Chronic diastolic (congestive) heart failure: Secondary | ICD-10-CM | POA: Insufficient documentation

## 2023-09-08 DIAGNOSIS — I130 Hypertensive heart and chronic kidney disease with heart failure and stage 1 through stage 4 chronic kidney disease, or unspecified chronic kidney disease: Secondary | ICD-10-CM | POA: Diagnosis not present

## 2023-09-08 DIAGNOSIS — K7460 Unspecified cirrhosis of liver: Secondary | ICD-10-CM | POA: Insufficient documentation

## 2023-09-08 DIAGNOSIS — Z7984 Long term (current) use of oral hypoglycemic drugs: Secondary | ICD-10-CM | POA: Diagnosis not present

## 2023-09-08 DIAGNOSIS — N183 Chronic kidney disease, stage 3 unspecified: Secondary | ICD-10-CM | POA: Diagnosis not present

## 2023-09-08 DIAGNOSIS — Z794 Long term (current) use of insulin: Secondary | ICD-10-CM | POA: Insufficient documentation

## 2023-09-08 DIAGNOSIS — J984 Other disorders of lung: Secondary | ICD-10-CM | POA: Diagnosis not present

## 2023-09-08 DIAGNOSIS — I270 Primary pulmonary hypertension: Secondary | ICD-10-CM

## 2023-09-08 MED ORDER — SOTATERCEPT-CSRK 2 X 45 MG ~~LOC~~ KIT: 0.3000 mg/kg | PACK | SUBCUTANEOUS | Status: DC

## 2023-09-08 NOTE — Telephone Encounter (Signed)
 Patient reports [facility] nurse will perform education and first dose of sotatercept next week. Patient has been instructed to return for labs ~1 week prior to her next dose. She will need CBC prior to the first 5 doses. Patient and daughter voiced understanding. Given platelets are relatively low at baseline and patient is taking prostacyclin analogue, will stop aspirin use for primary prevention.

## 2023-09-08 NOTE — Patient Instructions (Signed)
 Medication Changes:  No medication changes today!  Lab Work:  Go DOWN to LOWER LEVEL (LL) to have your blood work completed inside of [employer] office.   We will only call you if the results are abnormal or if the provider would like to make medication changes.   Follow-Up in: Please follow up with the Heart Failure Clinic in 2 months.  At the [facility], you and your health needs are our priority. We have a designated team specialized in the treatment of Heart Failure. This Care Team includes your primary Heart Failure Specialized Cardiologist (physician), Advanced Practice Providers (APPs- Physician Assistants and Nurse Practitioners), and Pharmacist who all work together to provide you with the care you need, when you need it.   You may see any of the following providers on your designated Care Team at your next follow up:  Dr. Arvilla Meres Dr. Marca Ancona Dr. Dorthula Nettles Dr. Theresia Bough Clarisa Kindred, FNP Enos Fling, RPH-CPP  Please be sure to bring in all your medications bottles to every appointment.   Need to Contact us:  If you have any questions or concerns before your next appointment please send [place] a message through [name] or call our office at [phone].    TO LEAVE A MESSAGE FOR THE NURSE SELECT OPTION 2, PLEASE LEAVE A MESSAGE INCLUDING: YOUR NAME DATE OF BIRTH CALL BACK NUMBER REASON FOR CALL**this is important as we prioritize the call backs  YOU WILL RECEIVE A CALL BACK THE SAME DAY AS LONG AS YOU CALL BEFORE 4:00 PM

## 2023-09-09 LAB — CBC
Hematocrit: 36.6 % (ref 34.0–46.6)
Hemoglobin: 11.9 g/dL (ref 11.1–15.9)
MCH: 28.3 pg (ref 26.6–33.0)
MCHC: 32.5 g/dL (ref 31.5–35.7)
MCV: 87 fL (ref 79–97)
Platelets: 124 10*3/uL — ABNORMAL LOW (ref 150–450)
RBC: 4.2 x10E6/uL (ref 3.77–5.28)
RDW: 16.2 % — ABNORMAL HIGH (ref 11.7–15.4)
WBC: 3.5 10*3/uL (ref 3.4–10.8)

## 2023-09-09 LAB — BASIC METABOLIC PANEL WITH GFR
BUN/Creatinine Ratio: 11 — ABNORMAL LOW (ref 12–28)
BUN: 15 mg/dL (ref 8–27)
CO2: 20 mmol/L (ref 20–29)
Calcium: 9.7 mg/dL (ref 8.7–10.3)
Chloride: 106 mmol/L (ref 96–106)
Creatinine, Ser: 1.38 mg/dL — ABNORMAL HIGH (ref 0.57–1.00)
Glucose: 60 mg/dL — ABNORMAL LOW (ref 70–99)
Potassium: 4.4 mmol/L (ref 3.5–5.2)
Sodium: 142 mmol/L (ref 134–144)
eGFR: 40 mL/min/{1.73_m2} — ABNORMAL LOW (ref >59)

## 2023-09-09 LAB — BRAIN NATRIURETIC PEPTIDE: BNP: 109.9 pg/mL — ABNORMAL HIGH (ref 0.0–100.0)

## 2023-09-09 NOTE — Progress Notes (Signed)
 PCP: Miki Kins, FNP HF Cardiology: Dr. Shirlee Latch  Chief complaint: Pulmonary HTN  78 y.o. with history of HTN, type 2 diabetes, OSA on CPAP, and pulmonary hypertension/RV failure was referred by Dr. Kirke Corin for evaluation of pulmonary hypertension. It appears that PH was diagnosed as early as 2018 by echo.  Echo in 2020 showed PA systolic pressure 85 mmHg.  She says that she was told that the pulmonary hypertension was due to OSA.  She has been compliant with CPAP. She reports exertional dyspnea for about 4 years now.   In 8/21, she was admitted to [facility] with CHF.  Echo showed EF 55-60%, mild LVH, D-shaped septum, severe RV dilation with moderate RV systolic dysfunction, PASP 83, moderate TR, moderate PI, dilated IVC.  RHC/LHC showed no coronary disease, severe pulmonary hypertension with PVR 7.38 and preserved cardiac output.  High resolution CT chest in 9/21 showed no evidence for fibrotic ILD.    She saw rheumatology and was given no definite rheumatological diagnosis.    Echo in 7/22 showed EF 65-70% with moderate LVH, RV moderately dilated with moderately decreased RV systolic function, mild RVH, D-shaped septum, PASP 85 mmHg, moderate PR, normal IVC.  Echo in 9/23 showed EF 55-60%, D-shaped septum, severe RV dilation with severe RV dysfunction, mild TR, PASP 50 mmHg.   She is off Uptravi, unable to tolerate even the lowest dose due to diarrhea and headache.  She tried Tyvaso DPI; she developed headaches and did not like inhaling and found the device hard to manipulate so she stopped it.  She was not interested in trying the inhaler form of Tyvaso.    Admitted 3/24 CHF/RV failure, no improvement after increase in Lasix. Diuresed with IV lasix, eventually transitioned to po Lasix 40 bid and discharged home, weight 168.5 lbs. Seen in ED 09/09/22 with dyspnea. BNP >2k, but felt she was stable for discharge, with close HF follow up.   Echo in 11/24 showed EF 60-65%, moderate LVH, mildly D-shaped  interventricular septum, mild RV enlargement and mildly decreased systolic function, unable to estimate PA systolic pressure.   RHC was repeated in 3/25, PA 74/18 mean 36 with PVR 8 WU, CI 2.14.   Today she returns for RV failure/pulmonary hypertension follow up. Weight is stable here and at home.  She is now taking Lasix 40 mg daily, cut back with elevated creatinine. She is using CPAP nightly. She is now on orenitram 3.25 mg tid but is having headaches at night. They are tolerable, she uses Tylenol.  Not very active, but no dyspnea with normal activities.  No chest pain.  No orthopnea/PND.  No lightheadedness/syncope/palpitations.  Not getting much exercise.   Labs (8/21): K 3.3, creatinine 1.39, ANA negative, anti-Sm negative, anti-SCL-70 negative, ANCA negative, RF elevated 52 but CCP negative, ESR 67, CRP 2.2 Labs (9/21): K 4.5, creatinine 1.8 => 1.45, anti-centromere Ab negative, BNP 309 Labs (11/21): K 4.1, creatinine 1.47, BNP 590 Labs (1/22): LDL 55, K 3.8, creatinine 1.61 Labs (5/22): LDL 74, K 4, creatinine 1.5 Labs (9/22): K 3.8, creatinine 1.43 Labs (12/22): LDL 54, K 3.8, creatinine 1.3 Labs (2/23): BNP 197, K 3.4, creatinine 1.3 Labs (6/23): BNP 862 Labs (9/23): K 4, creatinine 1.87 Labs (2/24): K 3.9, creatinine 1.39, LFTs normal Labs (4/24): K 3.5, creatinine 1.65 Labs (5/24): LDL 75 Labs (8/24): K 3.8, creatinine 1.27, hgb 9.3, LDL 50 Labs (9/24): K 4.4, creatinine 1.3 Labs (1/25): K 4, creatinine 1.39 Labs (3/25): K 4, creatinine 1.97, BNP 865  6 minute walk (8/21): 154 m 6 minute walk (11/21): 183 m 6 minute walk (3/22): 351 m 6 minute walk (11/22): 381 m 6 minute walk (2/23): 290 m (off Uptravi) 6 minute walk (8/24): 335 m  PMH: 1. HTN 2. Type 2 diabetes 3. OSA: Uses CPAP.  4. GERD 5. Hypothyroidism 6. Pulmonary hypertension/RV failure: Pulmonary hypertension diagnosed 2018 by echo.  - PFTs (6/18): moderate restriction - Echo 2020 with PASP 85 mmHg.  -  Echo (8/21): EF 55-60%, mild LVH, D-shaped septum, severe RV dilation with moderate RV systolic dysfunction, PASP 83, moderate TR, moderate PI, dilated IVC.  - LHC/RHC (8/21): Normal coronaries; mean RA 3, PA 82/21 mean 44, mean PCWP 14, CI 2.2, PVR 7.38 WU.  - V/Q scan (8/21): No evidence for chronic PE.  - Serologic workup: ANA negative, anti-Sm negative, anti-SCL-70 negative, ANCA negative, RF elevated 52 but CCP negative, ESR 67, CRP 2.2, anti-centromere negative - High resolution CT chest: No fibrotic ILD.  - Echo (7/22): EF 65-70% with moderate LVH, RV moderately dilated with moderately decreased RV systolic function, mild RVH, D-shaped septum, PASP 85 mmHg, moderate PR, normal IVC.  - Echo (9/23): EF 55-60%, D-shaped septum, severe RV dilation with severe RV dysfunction, mild TR, PASP 50 mmHg. - Echo (11/24): EF 60-65%, moderate LVH, mildly D-shaped interventricular septum, mild RV enlargement and mildly decreased systolic function, unable to estimate PA systolic pressure.  - RHC (3/25): mean RA 8, PA 74/18 mean 36, mean PCWP 5, CI 2.14, PVR 8 WU 7. CKD stage 3.  8. Cirrhosis: Noted on 9/23 CT abdomen/pelvis. Suspect cardiogenic.   Social History   Socioeconomic History   Marital status: Widowed    Spouse name: Chanetta Marshall   Number of children: 3   Years of education: 14   Highest education level: Not on file  Occupational History    Comment: work part time, day care   Occupation: Retired  Tobacco Use   Smoking status: Never   Smokeless tobacco: Never  Vaping Use   Vaping status: Never Used  Substance and Sexual Activity   Alcohol use: No    Alcohol/week: 0.0 standard drinks of alcohol   Drug use: No   Sexual activity: Not Currently    Birth control/protection: None, Post-menopausal  Other Topics Concern   Not on file  Social History Narrative   Caffeine use- coffee 2 cups daily, Coke once a week, green tea daily   Social Drivers of Corporate investment banker Strain: Not on  file  Food Insecurity: No Food Insecurity (05/15/2023)   Hunger Vital Sign    Worried About Running Out of Food in the Last Year: Never true    Ran Out of Food in the Last Year: Never true  Transportation Needs: No Transportation Needs (05/15/2023)   PRAPARE - Administrator, Civil Service (Medical): No    Lack of Transportation (Non-Medical): No  Physical Activity: Not on file  Stress: Not on file  Social Connections: Not on file  Intimate Partner Violence: Not At Risk (05/15/2023)   Humiliation, Afraid, Rape, and Kick questionnaire    Fear of Current or Ex-Partner: No    Emotionally Abused: No    Physically Abused: No    Sexually Abused: No   Family History  Problem Relation Age of Onset   Diabetes Mother    Hypertension Mother    Stroke Mother    Heart disease Mother        MI  Cancer Father        pancreatic   Stroke Father    Hypertension Sister    Diabetes Brother    Diabetes Maternal Grandfather    Diabetes Sister    Diabetes Sister    Diabetes Brother    Diabetes Brother    ROS: All systems reviewed and negative except as per HPI.   Current Outpatient Medications  Medication Sig Dispense Refill   ACCU-CHEK GUIDE TEST test strip TEST BLOOD SUGAR THREE TIMES DAILY AS NEEDED FOR OTHER( LOW BLOOD SUGAR SYMPTOMS) 100 strip 3   ambrisentan (LETAIRIS) 10 MG tablet Take 1 tablet (10 mg total) by mouth daily. 90 tablet 3   amLODipine (NORVASC) 10 MG tablet Take 1 tablet (10 mg total) by mouth daily. 90 tablet 3   Cholecalciferol (VITAMIN D3) 125 MCG (5000 UT) CAPS Take 5,000 Units by mouth daily.     clonazePAM (KLONOPIN) 1 MG tablet TAKE 1 TABLET BY MOUTH UP TO TWICE DAILY AS NEEDED FOR ANXIETY 60 tablet 3   dapagliflozin propanediol (FARXIGA) 10 MG TABS tablet Take 1 tablet (10 mg total) by mouth daily before breakfast. 90 tablet 3   diphenoxylate-atropine (LOMOTIL) 2.5-0.025 MG tablet Take 1 tablet by mouth 4 (four) times daily as needed for diarrhea or loose  stools. 120 tablet 2   escitalopram (LEXAPRO) 10 MG tablet TAKE 1 TABLET BY MOUTH EVERY MORNING 90 tablet 1   esomeprazole (NEXIUM) 40 MG capsule Take 1 capsule (40 mg total) by mouth daily. 90 capsule 1   Fe Fum-FA-B Cmp-C-Zn-Mg-Mn-Cu (HEMOCYTE PLUS) 106-1 MG CAPS Take 1 capsule by mouth daily. 90 capsule 1   furosemide (LASIX) 20 MG tablet Take 2 tablets (40 mg total) by mouth daily. 90 tablet 3   insulin aspart (NOVOLOG) 100 UNIT/ML injection Inject 4 Units into the skin 3 (three) times daily as needed for high blood sugar (Bs greater than 200).     Insulin Degludec (TRESIBA Gorman) Inject 18 Units into the skin at bedtime.     Insulin Pen Needle (B-D UF III MINI PEN NEEDLES) 31G X 5 MM MISC 1 Needle by Other route 2 (two) times daily. 100 each 5   levothyroxine (SYNTHROID) 75 MCG tablet TAKE 1 TABLET BY MOUTH EVERY MORNING ON AN EMPTY STOMACH 90 tablet 1   loperamide (IMODIUM) 2 MG capsule Take 4 mg by mouth daily as needed for diarrhea or loose stools.     loratadine (CLARITIN) 10 MG tablet Take 10 mg by mouth daily as needed for allergies.     meclizine (ANTIVERT) 25 MG tablet Take 25 mg by mouth 3 (three) times daily as needed for dizziness (Migraine).     metoprolol succinate (TOPROL-XL) 25 MG 24 hr tablet Take 1 tablet (25 mg total) by mouth daily. 90 tablet 3   Multiple Vitamins-Minerals (MULTI ADULT GUMMIES) CHEW Chew 1 tablet by mouth daily. Centrum     ORENITRAM 0.25 MG TBCR Take 0.75 mg by mouth See admin instructions. Take 0.75 mg at 10 am, 1600 and  2200 along with 1 mg. This week the Nurse will call  and give her the dose for next week. (White Pill)     ORENITRAM 1 MG TBCR Take 1 mg by mouth See admin instructions. Take 1 mg at 10 am, 1600 and 2200 for this week, The nurse will call and give her the dose for next week (Yellow Pill)     potassium chloride (KLOR-CON M) 20 MEQ tablet Take 1 tablet (20  mEq total) by mouth daily. 30 tablet 3   rosuvastatin (CRESTOR) 10 MG tablet TAKE 1  TABLET BY MOUTH EVERY DAY 90 tablet 2   tadalafil, PAH, (ADCIRCA) 20 MG tablet Take 2 tablets (40 mg total) by mouth daily. 60 tablet 11   Treprostinil Diolamine ER (ORENITRAM) 0.125 MG TBCR Take 0.125 mg by mouth 3 (three) times daily. (Blue Pill)     Treprostinil Diolamine ER (ORENITRAM) 2.5 MG TBCR Take 2.5 mg by mouth 3 (three) times daily.     sotatercept (WINREVAIR) 2 x 45 MG subcutaneous injection Inject 21.24 mg into the skin every 21 ( twenty-one) days.     No current facility-administered medications for this visit.   Wt Readings from Last 3 Encounters:  09/08/23 156 lb (70.8 kg)  08/24/23 154 lb (69.9 kg)  08/13/23 154 lb (69.9 kg)   BP 112/65   Pulse 62   Wt 156 lb (70.8 kg)   LMP  (LMP Unknown)   SpO2 95%   BMI 24.43 kg/m  General: NAD Neck: No JVD, no thyromegaly or thyroid nodule.  Lungs: Mild crackles at bases.  CV: Nondisplaced PMI.  Heart regular S1/S2, no S3/S4, no murmur.  No peripheral edema.  No carotid bruit.  Normal pedal pulses.  Abdomen: Soft, nontender, no hepatosplenomegaly, no distention.  Skin: Intact without lesions or rashes.  Neurologic: Alert and oriented x 3.  Psych: Normal affect. Extremities: No clubbing or cyanosis.  HEENT: Normal.   Assessment/Plan: 1. HTN: BP controlled.   - Continue amlodipine.  2. OSA: Continue CPAP.    3. Pulmonary HTN/RV failure: Echo in 8/21 with EF 55-60%, mild LVH, D-shaped septum, severe RV dilation with moderate RV systolic dysfunction, PASP 83, moderate TR, moderate PI, dilated IVC.  Suspect long-standing RV dysfunction based on appearance of echo. RHC in 8/21 with severe pulmonary arterial hypertension, preserved cardiac output. PFTs from 6/18 showed moderate restriction.  V/Q scan from 8/21 showed no evidence for chronic PE. She has treated OSA.  Serologic workup was negative except for elevated CRP and ESR and elevated RF (normal CCP).  High resolution CT chest did not show evidence for ILD. I suspect  long-standing group 1 PAH. Repeat echo in 7/22 showed EF 65-70% with moderate LVH, RV moderately dilated with moderately decreased RV systolic function, mild RVH, D-shaped septum, PASP 85 mmHg, moderate PR, normal IVC. Though echo still showed significant RV dysfunction, she symptomatically has felt better on PH meds.  She had to stop Uptravi due to intractable diarrhea and headache.  She was unable to use Tyvaso.  Echo in 9/23 showed EF 55-60%, D-shaped septum, severe RV dilation with severe RV dysfunction, mild TR, PASP 50 mmHg. Echo in 11/24 showed improved RV; EF 60-65%, moderate LVH, mildly D-shaped interventricular septum, mild RV enlargement and mildly decreased systolic function, unable to estimate PA systolic pressure.  RHC in 3/25 showed mean PA pressure 36 with PVR 8 WU and CI 2.14.  She is doing better symptomatically, today, NYHA class II and not volume overloaded on exam.  She is doing better with low sodium diet.  - Continue Lasix 40 mg daily, BMET/BNP today. - Continue Farxiga 10 mg daily.  - Continue ambrisentan 10.  - Continue tadalafil 40 mg daily.   - She is currently on orenitram 3.25 mg tid. She gets a daily headache that is manageable.  I will continue this does of orenitram until the headaches subside before increasing.  - She is going to start sotatercept, will  get CBC today.  - Continue CPAP (OSA likely contributes but certainly cannot explain the extent of her PH).   - 6 minute walk next appointment (have not marked it out in this office yet).  4. Pulmonary insufficiency: Moderate on 9/23 echo, only mild on echo in 11/24.   5. Cirrhosis: Noted on imaging. Most likely due  to RV failure and passive congestion, possible NAFLD.   Followup in 2 months.   I spent 32 minutes reviewing records, interviewing/examining patient, and managing orders. Marca Ancona  09/09/2023

## 2023-09-15 ENCOUNTER — Telehealth (HOSPITAL_COMMUNITY): Payer: Self-pay

## 2023-09-15 DIAGNOSIS — I272 Pulmonary hypertension, unspecified: Secondary | ICD-10-CM

## 2023-09-15 NOTE — Telephone Encounter (Signed)
 Patient called to get her labs prior to next injection scheduled

## 2023-09-19 ENCOUNTER — Other Ambulatory Visit: Payer: Self-pay | Admitting: Family

## 2023-09-25 ENCOUNTER — Other Ambulatory Visit (HOSPITAL_COMMUNITY)

## 2023-09-25 ENCOUNTER — Other Ambulatory Visit: Payer: Self-pay | Admitting: *Deleted

## 2023-09-25 ENCOUNTER — Other Ambulatory Visit: Payer: Self-pay

## 2023-09-25 DIAGNOSIS — I270 Primary pulmonary hypertension: Secondary | ICD-10-CM | POA: Diagnosis not present

## 2023-09-25 DIAGNOSIS — I272 Pulmonary hypertension, unspecified: Secondary | ICD-10-CM

## 2023-09-25 NOTE — Addendum Note (Signed)
 Addended by: Chapman Commodore on: 09/25/2023 11:26 AM   Modules accepted: Orders

## 2023-09-26 ENCOUNTER — Telehealth: Payer: Self-pay | Admitting: Pharmacist

## 2023-09-26 DIAGNOSIS — I270 Primary pulmonary hypertension: Secondary | ICD-10-CM

## 2023-09-26 LAB — CBC
Hematocrit: 37.9 % (ref 34.0–46.6)
Hemoglobin: 12.4 g/dL (ref 11.1–15.9)
MCH: 28.7 pg (ref 26.6–33.0)
MCHC: 32.7 g/dL (ref 31.5–35.7)
MCV: 88 fL (ref 79–97)
Platelets: 114 10*3/uL — ABNORMAL LOW (ref 150–450)
RBC: 4.32 x10E6/uL (ref 3.77–5.28)
RDW: 16 % — ABNORMAL HIGH (ref 11.7–15.4)
WBC: 3.6 10*3/uL (ref 3.4–10.8)

## 2023-09-26 NOTE — Telephone Encounter (Signed)
 CBC stable after sotercept initiation. No changes. Patient called and made aware to check CBC again in 3 weeks.

## 2023-09-27 ENCOUNTER — Telehealth: Payer: Self-pay | Admitting: Pharmacist

## 2023-09-27 ENCOUNTER — Other Ambulatory Visit: Payer: Self-pay

## 2023-09-27 ENCOUNTER — Other Ambulatory Visit: Payer: Self-pay | Admitting: Cardiology

## 2023-09-27 DIAGNOSIS — I270 Primary pulmonary hypertension: Secondary | ICD-10-CM

## 2023-09-27 MED ORDER — SOTATERCEPT-CSRK 2 X 60 MG ~~LOC~~ KIT: 0.7000 mg/kg | PACK | SUBCUTANEOUS | 11 refills | Status: AC

## 2023-09-27 NOTE — Telephone Encounter (Signed)
 Spoke with Karena, the patient's daughter and caregiver. Given CBC was stable, permission given to [facility] to titrate to 0.7 mg/kg dose for the second injection next week. The patient's daughter was called and updated with the plan of care and the phone number for refill requests.

## 2023-10-03 ENCOUNTER — Ambulatory Visit
Admission: RE | Admit: 2023-10-03 | Discharge: 2023-10-03 | Disposition: A | Discharge status: Home / Self Care | Source: Ambulatory Visit | Attending: Nurse Practitioner | Admitting: Nurse Practitioner

## 2023-10-03 DIAGNOSIS — K760 Fatty (change of) liver, not elsewhere classified: Secondary | ICD-10-CM | POA: Diagnosis not present

## 2023-10-03 DIAGNOSIS — K761 Chronic passive congestion of liver: Secondary | ICD-10-CM | POA: Insufficient documentation

## 2023-10-03 DIAGNOSIS — K7689 Other specified diseases of liver: Secondary | ICD-10-CM | POA: Diagnosis not present

## 2023-10-03 DIAGNOSIS — K824 Cholesterolosis of gallbladder: Secondary | ICD-10-CM | POA: Diagnosis not present

## 2023-10-13 ENCOUNTER — Encounter: Payer: Self-pay | Admitting: Family

## 2023-10-13 ENCOUNTER — Ambulatory Visit: Payer: Medicare PPO | Admitting: Family

## 2023-10-13 VITALS — BP 132/82 | HR 72 | Ht 67.0 in | Wt 151.6 lb

## 2023-10-13 DIAGNOSIS — N1832 Chronic kidney disease, stage 3b: Secondary | ICD-10-CM

## 2023-10-13 DIAGNOSIS — E559 Vitamin D deficiency, unspecified: Secondary | ICD-10-CM

## 2023-10-13 DIAGNOSIS — E1122 Type 2 diabetes mellitus with diabetic chronic kidney disease: Secondary | ICD-10-CM

## 2023-10-13 DIAGNOSIS — E538 Deficiency of other specified B group vitamins: Secondary | ICD-10-CM | POA: Diagnosis not present

## 2023-10-13 DIAGNOSIS — E039 Hypothyroidism, unspecified: Secondary | ICD-10-CM | POA: Diagnosis not present

## 2023-10-13 DIAGNOSIS — E782 Mixed hyperlipidemia: Secondary | ICD-10-CM | POA: Diagnosis not present

## 2023-10-13 DIAGNOSIS — Z794 Long term (current) use of insulin: Secondary | ICD-10-CM | POA: Diagnosis not present

## 2023-10-13 DIAGNOSIS — I270 Primary pulmonary hypertension: Secondary | ICD-10-CM | POA: Diagnosis not present

## 2023-10-13 DIAGNOSIS — I10 Essential (primary) hypertension: Secondary | ICD-10-CM

## 2023-10-13 DIAGNOSIS — D631 Anemia in chronic kidney disease: Secondary | ICD-10-CM

## 2023-10-13 LAB — POCT CBG (FASTING - GLUCOSE)-MANUAL ENTRY: Glucose Fasting, POC: 91 mg/dL (ref 70–99)

## 2023-10-14 ENCOUNTER — Other Ambulatory Visit: Payer: Self-pay | Admitting: Cardiology

## 2023-10-15 NOTE — Assessment & Plan Note (Signed)
Patient is seen by Endocrinology, who manage this condition.  She is well controlled with current therapy.   Will defer to them for further changes to plan of care.

## 2023-10-15 NOTE — Assessment & Plan Note (Signed)
 Checking labs today.  Continue current therapy for lipid control. Will modify as needed based on labwork results.

## 2023-10-15 NOTE — Assessment & Plan Note (Signed)
Patient is seen by Endocrine, who manage this condition.  She is well controlled with current therapy.   Will defer to them for further changes to plan of care.

## 2023-10-15 NOTE — Assessment & Plan Note (Signed)
 Patient is seen by Pulmonology, who manage this condition.  She is well controlled with current therapy.   Will defer to them for further changes to plan of care

## 2023-10-15 NOTE — Progress Notes (Signed)
 Established Patient Office Visit  Subjective:  Patient ID: Jamie Murray, female    DOB: 05/18/1946  Age: 78 y.o. MRN: [id number]  Chief Complaint  Patient presents with   Follow-up    3 month follow up    Patient is here today for her 3 months follow up.  She has been feeling fairly well since last appointment.   She does not have additional concerns to discuss today.  Labs are due today. She needs refills.   I have reviewed her active problem list, medication list, allergies, health maintenance, notes from last encounter, lab results for her appointment today.      No other concerns at this time.   Past Medical History:  Diagnosis Date   Abnormal electrocardiogram 11/25/2013   Formatting of this note might be different from the original. Last Assessment & Plan: Resolved when went to the ER.  Will f/u with Dr. Meredeth Stallion Note: Unchanged Formatting of this note might be different from the original. Note: Unchanged Formatting of this note might be different from the original.  Formatting of this note might be different from the original. Last Assessment & Plan: Resolved when we   Allergy    Anemia    Anxiety    Bilateral hand pain 07/25/2018   Cerebral infarction, unspecified (HCC) 01/26/2015   Chest pain 08/09/2016   Chest pain, rule out acute myocardial infarction 08/09/2016   Chronic diastolic heart failure (HCC)    COPD (chronic obstructive pulmonary disease) (HCC)    Depression    Diabetes (HCC)    Diarrhea, functional 01/25/2016   Edema of lower extremity 02/03/2020   Elevated troponin level 01/09/2020   Enlarged RV (right ventricle)    GERD (gastroesophageal reflux disease)    Hepatic steatosis    History of stroke 08/26/2022   Formatting of this note might be different from the original. Last Assessment & Plan:  Formatting of this note might be different from the original.  Continue ASA, statin     Hyperlipidemia    Hypertension    Hypokalemia 12/22/2015    Hypomagnesemia 03/22/2016   Insomnia    Macrocytosis 01/25/2016   Migraines    Nutmeg liver    Osteoporosis    Palpitations 11/25/2013   Formatting of this note might be different from the original. Note: Unchanged   Pulmonary hypertension (HCC)    [facility] spotted fever    Severe pulmonary arterial systolic hypertension (HCC)    Severe tricuspid regurgitation by prior echocardiogram    Sleep apnea    CPAP   Transient cerebral ischemia 2016   Type 2 diabetes mellitus with hyperglycemia (HCC) 02/14/2012   Formatting of this note might be different from the original. Last Assessment & Plan: Hgb A1C is 7.1%.  Don't make any changes now.  Will hopefully increase exercise when pulmonary hypertension is properly treated.  ? Pulmonary rehab Note: Unchanged Formatting of this note might be different from the original. Formatting of this note might be different from the original.  Formatting of this note    Vertigo    every 2-3 months    Past Surgical History:  Procedure Laterality Date   ABDOMINAL HYSTERECTOMY     CORONARY ANGIOPLASTY     ESOPHAGOGASTRODUODENOSCOPY (EGD) WITH PROPOFOL  N/A 08/24/2015   Procedure: ESOPHAGOGASTRODUODENOSCOPY (EGD) WITH PROPOFOL  with dialation;  Surgeon: Marnee Sink, MD;  Location: [facility];  Service: Endoscopy;  Laterality: N/A;  Diabetic - oral meds   RIGHT HEART CATH N/A 08/24/2023  Procedure: RIGHT HEART CATH;  Surgeon: Darlis Eisenmenger, MD;  Location: [facility] INVASIVE CV LAB;  Service: Cardiovascular;  Laterality: N/A;   RIGHT/LEFT HEART CATH AND CORONARY/GRAFT ANGIOGRAPHY N/A 01/13/2020   Procedure: RIGHT/LEFT HEART CATH AND CORONARY/GRAFT ANGIOGRAPHY;  Surgeon: Wenona Hamilton, MD;  Location: [facility] INVASIVE CV LAB;  Service: Cardiovascular;  Laterality: N/A;   TUBAL LIGATION      Social History   Socioeconomic History   Marital status: Widowed    Spouse name: Martie Slaughter   Number of children: 3   Years of education: 14   Highest education  level: Not on file  Occupational History    Comment: work part time, day care   Occupation: Retired  Tobacco Use   Smoking status: Never   Smokeless tobacco: Never  Vaping Use   Vaping status: Never Used  Substance and Sexual Activity   Alcohol use: No    Alcohol/week: 0.0 standard drinks of alcohol   Drug use: No   Sexual activity: Not Currently    Birth control/protection: None, Post-menopausal  Other Topics Concern   Not on file  Social History Narrative   Caffeine use- coffee 2 cups daily, Coke once a week, green tea daily   Social Drivers of Corporate investment banker Strain: Not on file  Food Insecurity: No Food Insecurity (05/15/2023)   Hunger Vital Sign    Worried About Running Out of Food in the Last Year: Never true    Ran Out of Food in the Last Year: Never true  Transportation Needs: No Transportation Needs (05/15/2023)   PRAPARE - Administrator, Civil Service (Medical): No    Lack of Transportation (Non-Medical): No  Physical Activity: Not on file  Stress: Not on file  Social Connections: Not on file  Intimate Partner Violence: Not At Risk (05/15/2023)   Humiliation, Afraid, Rape, and Kick questionnaire    Fear of Current or Ex-Partner: No    Emotionally Abused: No    Physically Abused: No    Sexually Abused: No    Family History  Problem Relation Age of Onset   Diabetes Mother    Hypertension Mother    Stroke Mother    Heart disease Mother        MI   Cancer Father        pancreatic   Stroke Father    Hypertension Sister    Diabetes Brother    Diabetes Maternal Grandfather    Diabetes Sister    Diabetes Sister    Diabetes Brother    Diabetes Brother     Allergies  Allergen Reactions   Atorvastatin      Note: joint pain   Fluoxetine Other (See Comments)    "felt crazy"   Levemir  [Insulin  Detemir] Other (See Comments)    Bruising   Prozac [Fluoxetine Hcl] Other (See Comments)    "felt crazy"    Review of Systems   Respiratory:  Positive for shortness of breath (with activity).   All other systems reviewed and are negative.      Objective:   BP 132/82   Pulse 72   Ht 5\' 7"  (1.702 m)   Wt 151 lb 9.6 oz (68.8 kg)   LMP  (LMP Unknown)   SpO2 100%   BMI 23.74 kg/m   Vitals:   10/13/23 1031  BP: 132/82  Pulse: 72  Height: 5\' 7"  (1.702 m)  Weight: 151 lb 9.6 oz (68.8 kg)  SpO2: 100%  BMI (Calculated):  23.74    Physical Exam Vitals and nursing note reviewed.  Constitutional:      Appearance: Normal appearance. She is normal weight.  HENT:     Head: Normocephalic and atraumatic.  Eyes:     Extraocular Movements: Extraocular movements intact.     Conjunctiva/sclera: Conjunctivae normal.     Pupils: Pupils are equal, round, and reactive to light.  Cardiovascular:     Rate and Rhythm: Normal rate.     Pulses: Normal pulses.  Pulmonary:     Effort: Pulmonary effort is normal.     Breath sounds: Normal breath sounds.  Neurological:     General: No focal deficit present.     Mental Status: She is alert and oriented to person, place, and time. Mental status is at baseline.  Psychiatric:        Mood and Affect: Mood normal.        Behavior: Behavior normal.        Thought Content: Thought content normal.        Judgment: Judgment normal.      Results for orders placed or performed in visit on 10/13/23  POCT CBG (Fasting - Glucose)  Result Value Ref Range   Glucose Fasting, POC 91 70 - 99 mg/dL    Recent Results (from the past 2160 hours)  B Nat Peptide     Status: Abnormal   Collection Time: 08/08/23  3:27 PM  Result Value Ref Range   BNP 865.8 (H) 0.0 - 100.0 pg/mL    Comment: Doctor, general practice Metabolic Panel (BMET)     Status: Abnormal   Collection Time: 08/08/23  3:27 PM  Result Value Ref Range   Glucose 95 70 - 99 mg/dL   BUN 20 8 - 27 mg/dL   Creatinine, Ser 9.56 (H) 0.57 - 1.00 mg/dL   eGFR 34 (L) >21 HY/QMV/7.84   BUN/Creatinine Ratio  13 12 - 28   Sodium 142 134 - 144 mmol/L   Potassium 4.3 3.5 - 5.2 mmol/L   Chloride 105 96 - 106 mmol/L   CO2 22 20 - 29 mmol/L   Calcium  9.7 8.7 - 10.3 mg/dL  CBC     Status: Abnormal   Collection Time: 08/08/23  3:27 PM  Result Value Ref Range   WBC 3.8 3.4 - 10.8 x10E3/uL   RBC 4.60 3.77 - 5.28 x10E6/uL   Hemoglobin 12.7 11.1 - 15.9 g/dL   Hematocrit 69.6 29.5 - 46.6 %   MCV 87 79 - 97 fL   MCH 27.6 26.6 - 33.0 pg   MCHC 31.7 31.5 - 35.7 g/dL   RDW 28.4 (H) 13.2 - 44.0 %   Platelets 116 (L) 150 - 450 x10E3/uL  Magnesium      Status: None   Collection Time: 08/08/23  3:27 PM  Result Value Ref Range   Magnesium  2.2 1.6 - 2.3 mg/dL  TSH     Status: None   Collection Time: 08/08/23  3:27 PM  Result Value Ref Range   TSH 2.160 0.450 - 4.500 uIU/mL  CBC with Differential/Platelet     Status: Abnormal   Collection Time: 08/14/23 12:16 AM  Result Value Ref Range   WBC 4.8 4.0 - 10.5 K/uL   RBC 4.96 3.87 - 5.11 MIL/uL   Hemoglobin 13.8 12.0 - 15.0 g/dL   HCT 10.2 72.5 - 36.6 %   MCV 89.3 80.0 - 100.0 fL   MCH 27.8 26.0 - 34.0 pg  MCHC 31.2 30.0 - 36.0 g/dL   RDW 36.6 (H) 44.0 - 34.7 %   Platelets 147 (L) 150 - 400 K/uL   nRBC 0.0 0.0 - 0.2 %   Neutrophils Relative % 56 %   Neutro Abs 2.6 1.7 - 7.7 K/uL   Lymphocytes Relative 40 %   Lymphs Abs 1.9 0.7 - 4.0 K/uL   Monocytes Relative 4 %   Monocytes Absolute 0.2 0.1 - 1.0 K/uL   Eosinophils Relative 0 %   Eosinophils Absolute 0.0 0.0 - 0.5 K/uL   Basophils Relative 0 %   Basophils Absolute 0.0 0.0 - 0.1 K/uL   Immature Granulocytes 0 %   Abs Immature Granulocytes 0.01 0.00 - 0.07 K/uL    Comment: Performed at [facility], [address], [place], [place] [address]  Basic metabolic panel     Status: Abnormal   Collection Time: 08/14/23 12:16 AM  Result Value Ref Range   Sodium 136 135 - 145 mmol/L   Potassium 3.9 3.5 - 5.1 mmol/L   Chloride 101 98 - 111 mmol/L   CO2 21 (L) 22 - 32 mmol/L   Glucose, Bld  172 (H) 70 - 99 mg/dL    Comment: Glucose reference range applies only to samples taken after fasting for at least 8 hours.   BUN 40 (H) 8 - 23 mg/dL   Creatinine, Ser 6.38 (H) 0.44 - 1.00 mg/dL   Calcium  9.0 8.9 - 10.3 mg/dL   GFR, Estimated 20 (L) >60 mL/min    Comment: (NOTE) Calculated using the CKD-EPI Creatinine Equation (2021)    Anion gap 14 5 - 15    Comment: Performed at [facility], [address] [place], [place] [address]  Magnesium      Status: None   Collection Time: 08/14/23 12:16 AM  Result Value Ref Range   Magnesium  2.2 1.7 - 2.4 mg/dL    Comment: Performed at [facility], [address], [place], [place] [address]  Basic metabolic panel     Status: Abnormal   Collection Time: 08/14/23  3:31 AM  Result Value Ref Range   Sodium 140 135 - 145 mmol/L   Potassium 4.0 3.5 - 5.1 mmol/L   Chloride 107 98 - 111 mmol/L   CO2 22 22 - 32 mmol/L   Glucose, Bld 115 (H) 70 - 99 mg/dL    Comment: Glucose reference range applies only to samples taken after fasting for at least 8 hours.   BUN 41 (H) 8 - 23 mg/dL   Creatinine, Ser 8.84 (H) 0.44 - 1.00 mg/dL   Calcium  8.6 (L) 8.9 - 10.3 mg/dL   GFR, Estimated 24 (L) >60 mL/min    Comment: (NOTE) Calculated using the CKD-EPI Creatinine Equation (2021)    Anion gap 11 5 - 15    Comment: Performed at [facility], [address] [place], [place] [address]  CBC     Status: Abnormal   Collection Time: 08/14/23  3:31 AM  Result Value Ref Range   WBC 4.3 4.0 - 10.5 K/uL   RBC 4.48 3.87 - 5.11 MIL/uL   Hemoglobin 12.7 12.0 - 15.0 g/dL   HCT 30.1 60.1 - 09.3 %   MCV 87.7 80.0 - 100.0 fL   MCH 28.3 26.0 - 34.0 pg   MCHC 32.3 30.0 - 36.0 g/dL   RDW 23.5 (H) 57.3 - 22.0 %   Platelets 110 (L) 150 - 400 K/uL   nRBC 0.0 0.0 - 0.2 %  Comment: Performed at [facility], [address] [place], [place] [address]  Hemoglobin A1c     Status: Abnormal   Collection Time: 08/14/23  3:31 AM   Result Value Ref Range   Hgb A1c MFr Bld 7.2 (H) 4.8 - 5.6 %    Comment: (NOTE) Pre diabetes:          5.7%-6.4%  Diabetes:              >6.4%  Glycemic control for   <7.0% adults with diabetes    Mean Plasma Glucose 159.94 mg/dL    Comment: Performed at [facility], [address] [place], [place] [address]  Hepatic function panel     Status: None   Collection Time: 08/14/23  3:31 AM  Result Value Ref Range   Total Protein 7.0 6.5 - 8.1 g/dL   Albumin  3.8 3.5 - 5.0 g/dL   AST 36 15 - 41 U/L   ALT 28 0 - 44 U/L   Alkaline Phosphatase 64 38 - 126 U/L   Total Bilirubin 0.7 0.0 - 1.2 mg/dL   Bilirubin, Direct 0.2 0.0 - 0.2 mg/dL   Indirect Bilirubin 0.5 0.3 - 0.9 mg/dL    Comment: Performed at [facility], [address] [place], [place] [address]  CBG monitoring, ED     Status: Abnormal   Collection Time: 08/14/23  7:56 AM  Result Value Ref Range   Glucose-Capillary 122 (H) 70 - 99 mg/dL    Comment: Glucose reference range applies only to samples taken after fasting for at least 8 hours.  Basic metabolic panel     Status: Abnormal   Collection Time: 08/18/23 11:28 AM  Result Value Ref Range   Glucose 126 (H) 70 - 99 mg/dL   BUN 36 (H) 8 - 27 mg/dL   Creatinine, Ser 9.14 (H) 0.57 - 1.00 mg/dL   eGFR 26 (L) >78 GN/FAO/1.30   BUN/Creatinine Ratio 18 12 - 28   Sodium 136 134 - 144 mmol/L   Potassium 4.0 3.5 - 5.2 mmol/L   Chloride 99 96 - 106 mmol/L   CO2 22 20 - 29 mmol/L   Calcium  9.3 8.7 - 10.3 mg/dL  Glucose, capillary     Status: Abnormal   Collection Time: 08/24/23  8:37 AM  Result Value Ref Range   Glucose-Capillary 128 (H) 70 - 99 mg/dL    Comment: Glucose reference range applies only to samples taken after fasting for at least 8 hours.   Comment 1 Notify RN    Comment 2 Document in Chart   POCT I-Stat EG7     Status: Abnormal   Collection Time: 08/24/23 12:09 PM  Result Value Ref Range   pH, Ven 7.397 7.25 - 7.43   pCO2, Ven 35.3 (L) 44 -  60 mmHg   pO2, Ven 32 32 - 45 mmHg   Bicarbonate 21.7 20.0 - 28.0 mmol/L   TCO2 23 22 - 32 mmol/L   O2 Saturation 62 %   Acid-base deficit 3.0 (H) 0.0 - 2.0 mmol/L   Sodium 141 135 - 145 mmol/L   Potassium 3.4 (L) 3.5 - 5.1 mmol/L   Calcium , Ion 1.24 1.15 - 1.40 mmol/L   HCT 36.0 36.0 - 46.0 %   Hemoglobin 12.2 12.0 - 15.0 g/dL   Sample type VENOUS    Comment NOTIFIED PHYSICIAN   POCT I-Stat EG7     Status: Abnormal   Collection Time: 08/24/23 12:09 PM  Result  Value Ref Range   pH, Ven 7.411 7.25 - 7.43   pCO2, Ven 36.0 (L) 44 - 60 mmHg   pO2, Ven 32 32 - 45 mmHg   Bicarbonate 22.8 20.0 - 28.0 mmol/L   TCO2 24 22 - 32 mmol/L   O2 Saturation 62 %   Acid-base deficit 1.0 0.0 - 2.0 mmol/L   Sodium 141 135 - 145 mmol/L   Potassium 3.5 3.5 - 5.1 mmol/L   Calcium , Ion 1.24 1.15 - 1.40 mmol/L   HCT 36.0 36.0 - 46.0 %   Hemoglobin 12.2 12.0 - 15.0 g/dL   Sample type VENOUS    Comment NOTIFIED PHYSICIAN   Glucose, capillary     Status: None   Collection Time: 08/24/23 12:31 PM  Result Value Ref Range   Glucose-Capillary 95 70 - 99 mg/dL    Comment: Glucose reference range applies only to samples taken after fasting for at least 8 hours.  Basic Metabolic Panel (BMET)     Status: Abnormal   Collection Time: 09/08/23 11:09 AM  Result Value Ref Range   Glucose 60 (L) 70 - 99 mg/dL   BUN 15 8 - 27 mg/dL   Creatinine, Ser 1.61 (H) 0.57 - 1.00 mg/dL   eGFR 40 (L) >09 UE/AVW/0.98   BUN/Creatinine Ratio 11 (L) 12 - 28   Sodium 142 134 - 144 mmol/L   Potassium 4.4 3.5 - 5.2 mmol/L   Chloride 106 96 - 106 mmol/L   CO2 20 20 - 29 mmol/L   Calcium  9.7 8.7 - 10.3 mg/dL  B Nat Peptide     Status: Abnormal   Collection Time: 09/08/23 11:09 AM  Result Value Ref Range   BNP 109.9 (H) 0.0 - 100.0 pg/mL    Comment: Siemens ADVIA Centaur XP methodology  CBC     Status: Abnormal   Collection Time: 09/08/23 11:09 AM  Result Value Ref Range   WBC 3.5 3.4 - 10.8 x10E3/uL   RBC 4.20 3.77 - 5.28  x10E6/uL   Hemoglobin 11.9 11.1 - 15.9 g/dL   Hematocrit 11.9 14.7 - 46.6 %   MCV 87 79 - 97 fL   MCH 28.3 26.6 - 33.0 pg   MCHC 32.5 31.5 - 35.7 g/dL   RDW 82.9 (H) 56.2 - 13.0 %   Platelets 124 (L) 150 - 450 x10E3/uL  CBC     Status: Abnormal   Collection Time: 09/25/23 11:29 AM  Result Value Ref Range   WBC 3.6 3.4 - 10.8 x10E3/uL   RBC 4.32 3.77 - 5.28 x10E6/uL   Hemoglobin 12.4 11.1 - 15.9 g/dL   Hematocrit 86.5 78.4 - 46.6 %   MCV 88 79 - 97 fL   MCH 28.7 26.6 - 33.0 pg   MCHC 32.7 31.5 - 35.7 g/dL   RDW 69.6 (H) 29.5 - 28.4 %   Platelets 114 (L) 150 - 450 x10E3/uL  POCT CBG (Fasting - Glucose)     Status: None   Collection Time: 10/13/23 10:45 AM  Result Value Ref Range   Glucose Fasting, POC 91 70 - 99 mg/dL       Assessment & Plan:   Problem List Items Addressed This Visit       Cardiovascular and Mediastinum   Hypertension   Blood pressure well controlled with current medications.  Continue current therapy.  Will reassess at follow up.       Relevant Orders   CMP14+EGFR   CBC with Diff   Primary pulmonary hypertension (  HCC)   Patient is seen by Pulmonology, who manage this condition.  She is well controlled with current therapy.   Will defer to them for further changes to plan of care.      Relevant Orders   CMP14+EGFR   CBC with Diff     Endocrine   Hypothyroidism   Patient is seen by Endocrine, who manage this condition.  She is well controlled with current therapy.   Will defer to them for further changes to plan of care.      Relevant Orders   CMP14+EGFR   TSH   CBC with Diff   Type II diabetes mellitus with renal manifestations [facility]) - Primary   Patient is seen by Endocrinology, who manage this condition.  She is well controlled with current therapy.   Will defer to them for further changes to plan of care.       Relevant Orders   POCT CBG (Fasting - Glucose) (Completed)   CMP14+EGFR   Hemoglobin A1c   CBC with Diff      Genitourinary   Anemia due to stage 3b chronic kidney disease (HCC) (Chronic)   Patient stable.  Well controlled with current therapy.   Continue current meds.        Relevant Orders   CMP14+EGFR   CBC with Diff     Other   Hyperlipidemia   Checking labs today.  Continue current therapy for lipid control. Will modify as needed based on labwork results.       Relevant Orders   Lipid panel   CMP14+EGFR   CBC with Diff   Other Visit Diagnoses       Vitamin D  deficiency, unspecified       Checking labs today.  Will continue supplements as needed.   Relevant Orders   VITAMIN D  25 Hydroxy (Vit-D Deficiency, Fractures)   CMP14+EGFR   CBC with Diff     B12 deficiency due to diet       Checking labs today.  Will continue supplements as needed.   Relevant Orders   CMP14+EGFR   Vitamin B12   CBC with Diff       Return in about 3 months (around 01/13/2024) for CPE.   Total time spent: 20 minutes  Trenda Frisk, FNP  10/13/2023   This document may have been prepared by [facility] Voice Recognition software and as such may include unintentional dictation errors.

## 2023-10-15 NOTE — Assessment & Plan Note (Signed)
 Patient stable.  Well controlled with current therapy.   Continue current meds.

## 2023-10-15 NOTE — Assessment & Plan Note (Signed)
 Blood pressure well controlled with current medications.  Continue current therapy.  Will reassess at follow up.

## 2023-10-17 LAB — CBC WITH DIFFERENTIAL/PLATELET
Basophils Absolute: 0 10*3/uL (ref 0.0–0.2)
Basos: 0 %
EOS (ABSOLUTE): 0.1 10*3/uL (ref 0.0–0.4)
Eos: 1 %
Hematocrit: 41.8 % (ref 34.0–46.6)
Hemoglobin: 13.8 g/dL (ref 11.1–15.9)
Immature Grans (Abs): 0 10*3/uL (ref 0.0–0.1)
Immature Granulocytes: 1 %
Lymphocytes Absolute: 1.5 10*3/uL (ref 0.7–3.1)
Lymphs: 33 %
MCH: 28.6 pg (ref 26.6–33.0)
MCHC: 33 g/dL (ref 31.5–35.7)
MCV: 87 fL (ref 79–97)
Monocytes Absolute: 0.2 10*3/uL (ref 0.1–0.9)
Monocytes: 4 %
Neutrophils Absolute: 2.7 10*3/uL (ref 1.4–7.0)
Neutrophils: 61 %
Platelets: 117 10*3/uL — ABNORMAL LOW (ref 150–450)
RBC: 4.82 x10E6/uL (ref 3.77–5.28)
RDW: 15.8 % — ABNORMAL HIGH (ref 11.7–15.4)
WBC: 4.4 10*3/uL (ref 3.4–10.8)

## 2023-10-17 LAB — LIPID PANEL
Chol/HDL Ratio: 2.6 ratio (ref 0.0–4.4)
Cholesterol, Total: 110 mg/dL (ref 100–199)
HDL: 43 mg/dL (ref >39)
LDL Chol Calc (NIH): 48 mg/dL (ref 0–99)
Triglycerides: 102 mg/dL (ref 0–149)
VLDL Cholesterol Cal: 19 mg/dL (ref 5–40)

## 2023-10-17 LAB — CMP14+EGFR
ALT: 15 IU/L (ref 0–32)
AST: 19 IU/L (ref 0–40)
Albumin: 4.4 g/dL (ref 3.8–4.8)
Alkaline Phosphatase: 101 IU/L (ref 44–121)
BUN/Creatinine Ratio: 16 (ref 12–28)
BUN: 25 mg/dL (ref 8–27)
Bilirubin Total: 0.5 mg/dL (ref 0.0–1.2)
CO2: 21 mmol/L (ref 20–29)
Calcium: 9.8 mg/dL (ref 8.7–10.3)
Chloride: 105 mmol/L (ref 96–106)
Creatinine, Ser: 1.53 mg/dL — ABNORMAL HIGH (ref 0.57–1.00)
Globulin, Total: 2.7 g/dL (ref 1.5–4.5)
Glucose: 83 mg/dL (ref 70–99)
Potassium: 4.6 mmol/L (ref 3.5–5.2)
Sodium: 141 mmol/L (ref 134–144)
Total Protein: 7.1 g/dL (ref 6.0–8.5)
eGFR: 35 mL/min/{1.73_m2} — ABNORMAL LOW (ref >59)

## 2023-10-17 LAB — VITAMIN B12: Vitamin B-12: 847 pg/mL (ref 232–1245)

## 2023-10-17 LAB — TSH: TSH: 2.09 u[IU]/mL (ref 0.450–4.500)

## 2023-10-17 LAB — HEMOGLOBIN A1C
Est. average glucose Bld gHb Est-mCnc: 146 mg/dL
Hgb A1c MFr Bld: 6.7 % — ABNORMAL HIGH (ref 4.8–5.6)

## 2023-10-17 LAB — VITAMIN D 25 HYDROXY (VIT D DEFICIENCY, FRACTURES): Vit D, 25-Hydroxy: 49.3 ng/mL (ref 30.0–100.0)

## 2023-10-18 ENCOUNTER — Other Ambulatory Visit: Payer: Self-pay

## 2023-10-18 DIAGNOSIS — Z79899 Other long term (current) drug therapy: Secondary | ICD-10-CM

## 2023-10-18 DIAGNOSIS — I270 Primary pulmonary hypertension: Secondary | ICD-10-CM

## 2023-10-19 ENCOUNTER — Ambulatory Visit: Payer: Self-pay

## 2023-10-19 LAB — CBC
Hematocrit: 40.2 % (ref 34.0–46.6)
Hemoglobin: 12.9 g/dL (ref 11.1–15.9)
MCH: 28.8 pg (ref 26.6–33.0)
MCHC: 32.1 g/dL (ref 31.5–35.7)
MCV: 90 fL (ref 79–97)
Platelets: 120 10*3/uL — ABNORMAL LOW (ref 150–450)
RBC: 4.48 x10E6/uL (ref 3.77–5.28)
RDW: 15.9 % — ABNORMAL HIGH (ref 11.7–15.4)
WBC: 3.7 10*3/uL (ref 3.4–10.8)

## 2023-10-23 ENCOUNTER — Telehealth: Payer: Self-pay | Admitting: Pharmacist

## 2023-10-23 DIAGNOSIS — I270 Primary pulmonary hypertension: Secondary | ICD-10-CM

## 2023-10-23 NOTE — Telephone Encounter (Signed)
 Called Ardia Kraft to discuss lab results. CBC is stable from previous, okay to continue with scheduled dose this week. CBC in 3 weeks.

## 2023-11-02 ENCOUNTER — Encounter: Admitting: Cardiology

## 2023-11-03 ENCOUNTER — Encounter: Admitting: Cardiology

## 2023-11-10 ENCOUNTER — Other Ambulatory Visit: Payer: Self-pay

## 2023-11-10 DIAGNOSIS — K761 Chronic passive congestion of liver: Secondary | ICD-10-CM | POA: Diagnosis not present

## 2023-11-10 DIAGNOSIS — I270 Primary pulmonary hypertension: Secondary | ICD-10-CM | POA: Diagnosis not present

## 2023-11-10 DIAGNOSIS — D802 Selective deficiency of immunoglobulin A [IgA]: Secondary | ICD-10-CM | POA: Diagnosis not present

## 2023-11-10 DIAGNOSIS — K529 Noninfective gastroenteritis and colitis, unspecified: Secondary | ICD-10-CM | POA: Diagnosis not present

## 2023-11-10 NOTE — Addendum Note (Signed)
 Addended by: Chapman Commodore on: 11/10/2023 10:47 AM   Modules accepted: Orders

## 2023-11-11 LAB — CBC
Hematocrit: 45.2 % (ref 34.0–46.6)
Hemoglobin: 14.5 g/dL (ref 11.1–15.9)
MCH: 28.8 pg (ref 26.6–33.0)
MCHC: 32.1 g/dL (ref 31.5–35.7)
MCV: 90 fL (ref 79–97)
Platelets: 130 10*3/uL — ABNORMAL LOW (ref 150–450)
RBC: 5.03 x10E6/uL (ref 3.77–5.28)
RDW: 16 % — ABNORMAL HIGH (ref 11.7–15.4)
WBC: 3.1 10*3/uL — ABNORMAL LOW (ref 3.4–10.8)

## 2023-11-12 ENCOUNTER — Ambulatory Visit (HOSPITAL_COMMUNITY): Payer: Self-pay | Admitting: Cardiology

## 2023-11-13 ENCOUNTER — Telehealth: Payer: Self-pay | Admitting: Pharmacist

## 2023-11-13 NOTE — Telephone Encounter (Signed)
 Attempted to call patient's daughter and caregiver regarding recent CBC with no response. Left voicemail with instructions to give the upcoming dose of sotatercept, then repeat CBC prior to following dose. This next CBC will be the last of 5 required with initiation.

## 2023-11-14 NOTE — Telephone Encounter (Signed)
 Spoke with Karena, the patient's daughter regarding lab results. Patient will receive the next dose this week. Patient reports headache suspected to be secondary to Orenitram .

## 2023-11-27 DIAGNOSIS — K529 Noninfective gastroenteritis and colitis, unspecified: Secondary | ICD-10-CM | POA: Diagnosis not present

## 2023-11-28 DIAGNOSIS — D631 Anemia in chronic kidney disease: Secondary | ICD-10-CM | POA: Diagnosis not present

## 2023-11-28 DIAGNOSIS — I10 Essential (primary) hypertension: Secondary | ICD-10-CM | POA: Diagnosis not present

## 2023-11-28 DIAGNOSIS — E1122 Type 2 diabetes mellitus with diabetic chronic kidney disease: Secondary | ICD-10-CM | POA: Diagnosis not present

## 2023-11-28 DIAGNOSIS — N184 Chronic kidney disease, stage 4 (severe): Secondary | ICD-10-CM | POA: Diagnosis not present

## 2023-11-28 DIAGNOSIS — N2581 Secondary hyperparathyroidism of renal origin: Secondary | ICD-10-CM | POA: Diagnosis not present

## 2023-12-03 ENCOUNTER — Other Ambulatory Visit: Payer: Self-pay | Admitting: Family

## 2023-12-04 DIAGNOSIS — E1122 Type 2 diabetes mellitus with diabetic chronic kidney disease: Secondary | ICD-10-CM | POA: Diagnosis not present

## 2023-12-04 DIAGNOSIS — N1832 Chronic kidney disease, stage 3b: Secondary | ICD-10-CM | POA: Diagnosis not present

## 2023-12-04 DIAGNOSIS — I10 Essential (primary) hypertension: Secondary | ICD-10-CM | POA: Diagnosis not present

## 2023-12-04 DIAGNOSIS — N2581 Secondary hyperparathyroidism of renal origin: Secondary | ICD-10-CM | POA: Diagnosis not present

## 2023-12-04 DIAGNOSIS — D631 Anemia in chronic kidney disease: Secondary | ICD-10-CM | POA: Diagnosis not present

## 2023-12-05 ENCOUNTER — Encounter: Payer: Self-pay | Admitting: Cardiology

## 2023-12-20 ENCOUNTER — Encounter: Payer: Self-pay | Admitting: Oncology

## 2023-12-21 DIAGNOSIS — F4321 Adjustment disorder with depressed mood: Secondary | ICD-10-CM | POA: Diagnosis not present

## 2023-12-29 ENCOUNTER — Encounter: Payer: Self-pay | Admitting: Emergency Medicine

## 2023-12-29 ENCOUNTER — Ambulatory Visit
Admission: EM | Admit: 2023-12-29 | Discharge: 2023-12-29 | Disposition: A | Discharge status: Home / Self Care | Attending: Emergency Medicine | Admitting: Emergency Medicine

## 2023-12-29 DIAGNOSIS — F32A Depression, unspecified: Secondary | ICD-10-CM | POA: Insufficient documentation

## 2023-12-29 DIAGNOSIS — E1122 Type 2 diabetes mellitus with diabetic chronic kidney disease: Secondary | ICD-10-CM | POA: Diagnosis not present

## 2023-12-29 DIAGNOSIS — I5032 Chronic diastolic (congestive) heart failure: Secondary | ICD-10-CM | POA: Diagnosis not present

## 2023-12-29 DIAGNOSIS — M2550 Pain in unspecified joint: Secondary | ICD-10-CM | POA: Diagnosis not present

## 2023-12-29 DIAGNOSIS — R252 Cramp and spasm: Secondary | ICD-10-CM | POA: Diagnosis not present

## 2023-12-29 DIAGNOSIS — I272 Pulmonary hypertension, unspecified: Secondary | ICD-10-CM | POA: Diagnosis not present

## 2023-12-29 DIAGNOSIS — I130 Hypertensive heart and chronic kidney disease with heart failure and stage 1 through stage 4 chronic kidney disease, or unspecified chronic kidney disease: Secondary | ICD-10-CM | POA: Insufficient documentation

## 2023-12-29 DIAGNOSIS — E785 Hyperlipidemia, unspecified: Secondary | ICD-10-CM | POA: Diagnosis not present

## 2023-12-29 DIAGNOSIS — F419 Anxiety disorder, unspecified: Secondary | ICD-10-CM | POA: Insufficient documentation

## 2023-12-29 DIAGNOSIS — J449 Chronic obstructive pulmonary disease, unspecified: Secondary | ICD-10-CM | POA: Diagnosis not present

## 2023-12-29 DIAGNOSIS — R5383 Other fatigue: Secondary | ICD-10-CM | POA: Diagnosis not present

## 2023-12-29 DIAGNOSIS — R1110 Vomiting, unspecified: Secondary | ICD-10-CM | POA: Diagnosis not present

## 2023-12-29 DIAGNOSIS — Z8673 Personal history of transient ischemic attack (TIA), and cerebral infarction without residual deficits: Secondary | ICD-10-CM | POA: Insufficient documentation

## 2023-12-29 DIAGNOSIS — Z136 Encounter for screening for cardiovascular disorders: Secondary | ICD-10-CM | POA: Diagnosis not present

## 2023-12-29 LAB — URINALYSIS, W/ REFLEX TO CULTURE (INFECTION SUSPECTED)
Bilirubin Urine: NEGATIVE
Glucose, UA: 500 mg/dL — AB
Ketones, ur: NEGATIVE mg/dL
Nitrite: NEGATIVE
Protein, ur: 30 mg/dL — AB
Specific Gravity, Urine: <1.005 — ABNORMAL LOW (ref 1.005–1.030)
pH: 5.5 (ref 5.0–8.0)

## 2023-12-29 LAB — COMPREHENSIVE METABOLIC PANEL WITH GFR
ALT: 24 U/L (ref 0–44)
AST: 25 U/L (ref 15–41)
Albumin: 3.7 g/dL (ref 3.5–5.0)
Alkaline Phosphatase: 84 U/L (ref 38–126)
Anion gap: 11 (ref 5–15)
BUN: 22 mg/dL (ref 8–23)
CO2: 22 mmol/L (ref 22–32)
Calcium: 9.5 mg/dL (ref 8.9–10.3)
Chloride: 102 mmol/L (ref 98–111)
Creatinine, Ser: 1.54 mg/dL — ABNORMAL HIGH (ref 0.44–1.00)
GFR, Estimated: 35 mL/min — ABNORMAL LOW (ref >60)
Glucose, Bld: 104 mg/dL — ABNORMAL HIGH (ref 70–99)
Potassium: 3.7 mmol/L (ref 3.5–5.1)
Sodium: 135 mmol/L (ref 135–145)
Total Bilirubin: 0.5 mg/dL (ref 0.0–1.2)
Total Protein: 7.1 g/dL (ref 6.5–8.1)

## 2023-12-29 LAB — RESP PANEL BY RT-PCR (FLU A&B, COVID) ARPGX2
Influenza A by PCR: NEGATIVE
Influenza B by PCR: NEGATIVE
SARS Coronavirus 2 by RT PCR: NEGATIVE

## 2023-12-29 NOTE — Discharge Instructions (Addendum)
 As we discussed, the cause of your fatigue, nausea, and worsening diarrhea is clear.  There is no evidence of dehydration and your lab work today and your electrolytes are normal.  You have some blood in the stool from your house associated fevers or developing yeast infection due to being on Farxiga .  I have offered you Diflucan but she states she would prefer to use the medication you have received from the pharmacy.  Keep your appointment next month with your oncologist to discuss your symptoms.  I would call their office to see if you could be seen sooner.  If you develop any chest pain, shortness of breath, develop vomiting or you cannot keep down fluids, have an increase in diarrhea stools or develop blood in your stool you need to go to the emergency department.  Also if you develop fever.

## 2023-12-29 NOTE — ED Provider Notes (Signed)
 [facility]    CSN: [id number] Arrival date & time: 12/29/23  9070      History   Chief Complaint Chief Complaint  Patient presents with   Fatigue   Joint Pain   Emesis    HPI Jamie Murray is a 78 y.o. female.   HPI  78 year old female with past medical history significant for diabetes, hypertension, hyperlipidemia, GERD, COPD, CVA, TIA, vertigo, anxiety, depression, migraine headaches, chronic diastolic heart failure, severe pulmonary arterial systolic hypertension, pulmonary hypertension, nutmeg liver, enlarged right ventricle, functional diarrhea, sleep apnea presents for evaluation of constitutional complaints that consist of fatigue, body aches, and cramps in her legs that have gone on for last 5 days.  Last night she had a single episode of vomiting along with shortness of breath that lasted approximately 30 to 45 minutes.  EMS was called and administered oxygen  which helped the patient's symptoms.  She refused transport.  She reports that she has been experiencing diarrhea for the last 10 years but recently has had an increase in her diarrhea stools.  She denies blood in her stool.  She denies any upper or lower respiratory symptoms aside from the shortness of breath.  She denies chest pain.  Past Medical History:  Diagnosis Date   Abnormal electrocardiogram 11/25/2013   Formatting of this note might be different from the original. Last Assessment & Plan: Resolved when went to the ER.  Will f/u with Dr. Fernand Note: Unchanged Formatting of this note might be different from the original. Note: Unchanged Formatting of this note might be different from the original.  Formatting of this note might be different from the original. Last Assessment & Plan: Resolved when we   Allergy    Anemia    Anxiety    Bilateral hand pain 07/25/2018   Cerebral infarction, unspecified (HCC) 01/26/2015   Chest pain 08/09/2016   Chest pain, rule out acute myocardial infarction  08/09/2016   Chronic diastolic heart failure (HCC)    COPD (chronic obstructive pulmonary disease) (HCC)    Depression    Diabetes (HCC)    Diarrhea, functional 01/25/2016   Edema of lower extremity 02/03/2020   Elevated troponin level 01/09/2020   Enlarged RV (right ventricle)    GERD (gastroesophageal reflux disease)    Hepatic steatosis    History of stroke 08/26/2022   Formatting of this note might be different from the original. Last Assessment & Plan:  Formatting of this note might be different from the original.  Continue ASA, statin     Hyperlipidemia    Hypertension    Hypokalemia 12/22/2015   Hypomagnesemia 03/22/2016   Insomnia    Macrocytosis 01/25/2016   Migraines    Nutmeg liver    Osteoporosis    Palpitations 11/25/2013   Formatting of this note might be different from the original. Note: Unchanged   Pulmonary hypertension (HCC)    [facility] spotted fever    Severe pulmonary arterial systolic hypertension (HCC)    Severe tricuspid regurgitation by prior echocardiogram    Sleep apnea    CPAP   Transient cerebral ischemia 2016   Type 2 diabetes mellitus with hyperglycemia (HCC) 02/14/2012   Formatting of this note might be different from the original. Last Assessment & Plan: Hgb A1C is 7.1%.  Don't make any changes now.  Will hopefully increase exercise when pulmonary hypertension is properly treated.  ? Pulmonary rehab Note: Unchanged Formatting of this note might be different from the original. Formatting  of this note might be different from the original.  Formatting of this note    Vertigo    every 2-3 months    Patient Active Problem List   Diagnosis Date Noted   Acute kidney injury superimposed on chronic kidney disease (HCC) 08/14/2023   Type 2 diabetes mellitus with chronic kidney disease, with long-term current use of insulin  (HCC) 08/14/2023   Dyslipidemia 08/14/2023   GERD without esophagitis 08/14/2023   Depression 08/14/2023   Essential  hypertension 08/14/2023   Chronic diastolic heart failure (HCC) 08/26/2022   Myocardial injury 08/26/2022   Type II diabetes mellitus with renal manifestations (HCC) 08/26/2022   Diarrhea 08/26/2022   Chronic obstructive pulmonary disease, unspecified COPD type (HCC) 07/15/2022   Interstitial pulmonary disease (HCC) 07/15/2022   Anemia due to stage 3b chronic kidney disease (HCC) 03/15/2021   Rheumatoid factor positive 08/24/2020   Edema of lower extremity 02/03/2020   MGUS (monoclonal gammopathy of unknown significance) 02/03/2020   Vitreous floaters of right eye    Paresthesia of hand 07/25/2018   Primary pulmonary hypertension (HCC) 11/29/2016   Obstructive sleep apnea 09/29/2016   Impaired mobility 08/26/2016   Chronic fatigue 05/11/2016   Anemia 05/11/2016   Syncope and collapse 03/17/2016   Chronic kidney disease, stage 3b (HCC) 12/22/2015   Hiatal hernia    Stricture and stenosis of esophagus    Gastro-esophageal reflux disease without esophagitis 07/22/2015   Osteoporosis 01/26/2015   Allergic rhinitis 01/26/2015   Anxiety and depression 01/26/2015   Hypertension 01/26/2015   Mixed anxiety and depressive disorder 01/26/2015   Hyperlipidemia 01/26/2015   Insomnia 10/06/2014   Dizziness and giddiness 10/06/2014   Chronic tension-type headache, intractable 10/06/2014   Hypothyroidism 03/20/2012   Type 2 diabetes mellitus with hyperglycemia (HCC) 02/14/2012   Arthritis 06/28/2007    Past Surgical History:  Procedure Laterality Date   ABDOMINAL HYSTERECTOMY     CORONARY ANGIOPLASTY     ESOPHAGOGASTRODUODENOSCOPY (EGD) WITH PROPOFOL  N/A 08/24/2015   Procedure: ESOPHAGOGASTRODUODENOSCOPY (EGD) WITH PROPOFOL  with dialation;  Surgeon: Rogelia Copping, MD;  Location: [facility];  Service: Endoscopy;  Laterality: N/A;  Diabetic - oral meds   RIGHT HEART CATH N/A 08/24/2023   Procedure: RIGHT HEART CATH;  Surgeon: Rolan Ezra RAMAN, MD;  Location: [facility] INVASIVE CV LAB;   Service: Cardiovascular;  Laterality: N/A;   RIGHT/LEFT HEART CATH AND CORONARY/GRAFT ANGIOGRAPHY N/A 01/13/2020   Procedure: RIGHT/LEFT HEART CATH AND CORONARY/GRAFT ANGIOGRAPHY;  Surgeon: Darron Deatrice LABOR, MD;  Location: [facility] INVASIVE CV LAB;  Service: Cardiovascular;  Laterality: N/A;   TUBAL LIGATION      OB History   No obstetric history on file.      Home Medications    Prior to Admission medications   Medication Sig Start Date End Date Taking? Authorizing Provider  ACCU-CHEK GUIDE TEST test strip TEST BLOOD SUGAR THREE TIMES DAILY AS NEEDED FOR OTHER( LOW BLOOD SUGAR SYMPTOMS) 08/23/23  Yes Orlean Alan HERO, FNP  ambrisentan  (LETAIRIS ) 10 MG tablet Take 1 tablet (10 mg total) by mouth daily. 08/31/23  Yes Rolan Ezra RAMAN, MD  amLODipine  (NORVASC ) 10 MG tablet Take 1 tablet (10 mg total) by mouth daily. 05/26/23  Yes Rolan Ezra RAMAN, MD  Cholecalciferol  (VITAMIN D3) 125 MCG (5000 UT) CAPS Take 5,000 Units by mouth daily.   Yes [provider]  clonazePAM  (KLONOPIN ) 1 MG tablet TAKE 1 TABLET BY MOUTH UP TO TWICE DAILY AS NEEDED FOR ANXIETY 08/23/23  Yes Orlean Alan HERO, FNP  dapagliflozin  propanediol (FARXIGA ) 10 MG TABS tablet Take 1 tablet (10 mg total) by mouth daily before breakfast. 05/26/23  Yes Mil[place], Harlene HERO, FNP  diphenoxylate -atropine  (LOMOTIL ) 2.5-0.025 MG tablet Take 1 tablet by mouth 4 (four) times daily as needed for diarrhea or loose stools. 01/20/23 01/20/24 Yes Orlean Alan HERO, FNP  escitalopram  (LEXAPRO ) 10 MG tablet TAKE 1 TABLET BY MOUTH EVERY MORNING 09/06/23  Yes Orlean Alan HERO, FNP  esomeprazole  (NEXIUM ) 40 MG capsule TAKE 1 CAPSULE(40 MG) BY MOUTH DAILY 09/20/23  Yes Orlean Alan HERO, FNP  Fe Fum-FA-B Cmp-C-Zn-Mg-Mn-Cu (HEMOCYTE PLUS) 106-1 MG CAPS Take 1 capsule by mouth daily. 07/14/23  Yes Orlean Alan HERO, FNP  furosemide  (LASIX ) 20 MG tablet TAKE 3 TABLETS(60 MG) BY MOUTH TWICE DAILY 10/18/23  Yes Rolan Ezra RAMAN, MD  insulin  aspart (NOVOLOG )  100 UNIT/ML injection Inject 4 Units into the skin 3 (three) times daily as needed for high blood sugar (Bs greater than 200).   Yes [provider]  Insulin  Degludec (TRESIBA [place]) Inject 16 Units into the skin at bedtime.   Yes [provider]  levothyroxine  (SYNTHROID ) 75 MCG tablet TAKE 1 TABLET BY MOUTH EVERY MORNING ON AN EMPTY STOMACH 12/04/23  Yes Orlean Alan HERO, FNP  loratadine  (CLARITIN ) 10 MG tablet Take 10 mg by mouth daily as needed for allergies.   Yes [provider]  metoprolol  succinate (TOPROL -XL) 25 MG 24 hr tablet Take 1 tablet (25 mg total) by mouth daily. 01/18/23  Yes Rolan Ezra RAMAN, MD  Multiple Vitamins-Minerals (MULTI ADULT GUMMIES) CHEW Chew 1 tablet by mouth daily. Centrum   Yes [provider]  ORENITRAM  0.25 MG TBCR Take 0.75 mg by mouth See admin instructions. Take 0.75 mg at 10 am, 1600 and  2200 along with 1 mg. This week the Nurse will call  and give her the dose for next week. (White Pill) 01/06/23  Yes [provider]  ORENITRAM  1 MG TBCR Take 1 mg by mouth See admin instructions. Take 1 mg at 10 am, 1600 and 2200 for this week, The nurse will call and give her the dose for next week (Yellow Pill) 01/06/23  Yes [provider]  potassium chloride  (KLOR-CON  M) 20 MEQ tablet Take 1 tablet (20 mEq total) by mouth daily. 08/08/23  Yes Rolan Ezra RAMAN, MD  rosuvastatin  (CRESTOR ) 10 MG tablet TAKE 1 TABLET BY MOUTH EVERY DAY 08/11/23  Yes Orlean Alan HERO, FNP  sotatercept (WINREVAIR ) 2 x 60 MG subcutaneous injection Inject 49.56 mg into the skin every 21 ( twenty-one) days. 09/27/23  Yes Rolan Ezra RAMAN, MD  Insulin  Pen Needle (B-D UF III MINI PEN NEEDLES) 31G X 5 MM MISC 1 Needle by Other route 2 (two) times daily. 04/12/23   Orlean Alan HERO, FNP  loperamide  (IMODIUM ) 2 MG capsule Take 4 mg by mouth daily as needed for diarrhea or loose stools.    [provider]  meclizine  (ANTIVERT ) 25 MG tablet Take 25 mg by  mouth 3 (three) times daily as needed for dizziness (Migraine).    [provider]  tadalafil , PAH, (ADCIRCA ) 20 MG tablet Take 2 tablets (40 mg total) by mouth daily. 06/26/23   Rolan Ezra RAMAN, MD  Treprostinil  Diolamine ER (ORENITRAM ) 0.125 MG TBCR Take 0.125 mg by mouth 3 (three) times daily. (Blue Pill)    [provider]  Treprostinil  Diolamine ER (ORENITRAM ) 2.5 MG TBCR Take 2.5 mg by mouth 3 (three) times daily. 08/21/23   [provider]    Family History Family History  Problem Relation Age of Onset   Diabetes Mother    Hypertension Mother    Stroke Mother    Heart disease Mother        MI   Cancer Father        pancreatic   Stroke Father    Hypertension Sister    Diabetes Brother    Diabetes Maternal Grandfather    Diabetes Sister    Diabetes Sister    Diabetes Brother    Diabetes Brother     Social History Social History   Tobacco Use   Smoking status: Never   Smokeless tobacco: Never  Vaping Use   Vaping status: Never Used  Substance Use Topics   Alcohol use: No    Alcohol/week: 0.0 standard drinks of alcohol   Drug use: No     Allergies   Atorvastatin , Fluoxetine, Levemir  [insulin  detemir], and Prozac [fluoxetine hcl]   Review of Systems Review of Systems  Constitutional:  Positive for fatigue. Negative for fever.  HENT:  Negative for congestion, ear pain, rhinorrhea and sore throat.   Respiratory:  Positive for shortness of breath. Negative for cough and wheezing.   Cardiovascular:  Negative for chest pain, palpitations and leg swelling.  Gastrointestinal:  Positive for diarrhea, nausea and vomiting. Negative for blood in stool.  Musculoskeletal:  Positive for arthralgias and myalgias.  Skin:  Negative for rash.  Neurological:  Negative for syncope.     Physical Exam Triage Vital Signs ED Triage Vitals  Encounter Vitals Group     BP      Girls Systolic BP Percentile      Girls Diastolic BP Percentile      Boys  Systolic BP Percentile      Boys Diastolic BP Percentile      Pulse      Resp      Temp      Temp src      SpO2      Weight      Height      Head Circumference      Peak Flow      Pain Score      Pain Loc      Pain Education      Exclude from Growth Chart    No data found.  Updated Vital Signs BP (!) 150/90 (BP Location: Right Arm)   Pulse 60   Temp (!) 97.5 F (36.4 C) (Oral)   Resp 18   LMP  (LMP Unknown)   SpO2 95%   Visual Acuity Right Eye Distance:   Left Eye Distance:   Bilateral Distance:    Right Eye Near:   Left Eye Near:    Bilateral Near:     Physical Exam Vitals and nursing note reviewed.  Constitutional:      Appearance: Normal appearance. She is not ill-appearing or diaphoretic.  HENT:     Head: Normocephalic and atraumatic.     Mouth/Throat:     Mouth: Mucous membranes are moist.     Pharynx: Oropharynx is clear. No oropharyngeal exudate or posterior oropharyngeal erythema.  Eyes:     General: No scleral icterus. Cardiovascular:     Rate and Rhythm: Normal rate and regular rhythm.     Pulses: Normal pulses.     Heart sounds: Normal heart sounds. No murmur heard.    No friction rub. No gallop.  Pulmonary:     Effort: Pulmonary effort is  normal.     Breath sounds: Normal breath sounds. No wheezing, rhonchi or rales.  Skin:    General: Skin is warm and dry.     Capillary Refill: Capillary refill takes less than 2 seconds.     Findings: No rash.  Neurological:     General: No focal deficit present.     Mental Status: She is alert and oriented to person, place, and time.      UC Treatments / Results  Labs (all labs ordered are listed, but only abnormal results are displayed) Labs Reviewed  COMPREHENSIVE METABOLIC PANEL WITH GFR - Abnormal; Notable for the following components:      Result Value   Glucose, Bld 104 (*)    Creatinine, Ser 1.54 (*)    GFR, Estimated 35 (*)    All other components within normal limits  URINALYSIS, W/  REFLEX TO CULTURE (INFECTION SUSPECTED) - Abnormal; Notable for the following components:   Specific Gravity, Urine <1.005 (*)    Glucose, UA 500 (*)    Hgb urine dipstick TRACE (*)    Protein, ur 30 (*)    Leukocytes,Ua TRACE (*)    Bacteria, UA FEW (*)    All other components within normal limits  RESP PANEL BY RT-PCR (FLU A&B, COVID) [id number]    EKG Sinus rhythm with first-degree AV block Ventricular rate 65 bpm PR interval 244 ms QRS duration 104 ms QT/QTc 436/453 ms Incomplete right bundle branch block with ST and T wave abnormalities.  Radiology No results found.  Procedures Procedures (including critical care time)  Medications Ordered in UC Medications - No data to display  Initial Impression / Assessment and Plan / UC Course  I have reviewed the triage vital signs and the nursing notes.  Pertinent labs & imaging results that were available during my care of the patient were reviewed by me and considered in my medical decision making (see chart for details).   Patient is a pleasant, nontoxic-appearing 78 year old female presenting for evaluation of possible dehydration.  She is currently in a drug tile for her pulmonary hypertension and reports that she has to weigh herself daily and at a certain weight she takes 1 pill and above another weight she takes 2 pills.  Unsure what the medications are though they sound to be diuretics.  In the exam room she is not in any acute distress.  Her daughter and son, her son is with her, are concerned about dehydration.  She has bright sclera and moist oral mucous membranes.  Her skin turgor is normal.  Cardiopulmonary exam was S1-S2 heart sounds with regular rate and rhythm and lung sounds are clear to auscultation all fields.  She has no peripheral or pedal edema.  DP and PT pulses are 2+ bilaterally.  Differential diagnosis includes electrolyte abnormality, dehydration, viral GI illness, COVID, influenza.  COVID and influenza are less  likely given the patient does not receive visitors and largely stays at home, though she does go to the pharmacy and to the store.  She has a history of functional diarrhea and reports that she has recently had an increase in diarrhea stools.  She is eating and drinking.  I will obtain a CMP to evaluate for any electrolyte abnormality, urinalysis to assess for the spillage of ketones and to assess the patient's hydration status, EKG due to the fact the patient experienced shortness of breath last night.  She reports that she was on the phone with her daughter and indicates  that the episode lasted approximately 30 to 45 minutes.  EMS was called and administered supplemental oxygen  which helped.  The patient subsequently refused transport to the hospital.  The patient's son thinks that it was an anxiety attack, as the patient is prone to these and they present in a similar manner.  She is not having any chest pain.  Additionally, I will order a COVID and flu PCR.  COVID and flu PCR is negative.  Patient's EKG shows sinus rhythm with first-degree AV block as well as incomplete right bundle branch block.  The reading is showing ST-T wave abnormalities consider inferior and anterolateral ischemia.  When compared to the patient's EKG dated 08/08/2023 the tracing appears similar.  At that time her period full was 206 ms and now is reading 238.  The first-degree AV block is not new.  No other abnormalities noted.  CMP shows normal electrolytes with a sodium of 135, potassium of 3.7, and chloride of 102.  Glucose is mildly elevated 104.  BUN is normal at 22 but patient's creatinine is elevated at 1.54.  No elevation to transaminases.  When reviewing historical trends of lab work in epic the patient's creatinine on 10/13/2023 was 1.53.  There is no other change noted.  Urinalysis shows low specific gravity of less than 1.005.  500 glucose in the urine with trace hemoglobin.  30 protein.  Also trace leukocyte esterase  but negative for nitrates.  Reflex microscopy shows budding yeast, hyaline casts, and few bacteria present.  Etiology of patient's symptoms is unclear.  This could be worsening of her MGUS or IgA deficiency.  The patient does have some budding yeast present on her urinalysis but she is declining Diflucan.  She reports that she will use the over-the-counter cream that she purchased from the pharmacy.  She does have an appointment coming up with oncology and is scheduled for an iron  infusion at the end of August.  I advised her to keep that appointment and to call her oncologist to see if she can be seen sooner.  If she has worsening of her diarrhea starts passing blood in her stool, develops vomiting where she cannot fluids, patient and her son verbalized understanding of same.   Final Clinical Impressions(s) / UC Diagnoses   Final diagnoses:  Fatigue, unspecified type     Discharge Instructions      As we discussed, the cause of your fatigue, nausea, and worsening diarrhea is clear.  There is no evidence of dehydration and your lab work today and your electrolytes are normal.  You have some blood in the stool from your house associated fevers or developing yeast infection due to being on Farxiga .  I have offered you Diflucan but she states she would prefer to use the medication you have received from the pharmacy.  Keep your appointment next month with your oncologist to discuss your symptoms.  I would call their office to see if you could be seen sooner.  If you develop any chest pain, shortness of breath, develop vomiting or you cannot keep down fluids, have an increase in diarrhea stools or develop blood in your stool you need to go to the emergency department.  Also if you develop fever.     ED Prescriptions   None    PDMP not reviewed this encounter.   Bernardino Ditch, NP 12/29/23 1124

## 2023-12-29 NOTE — ED Triage Notes (Signed)
 Fatigue, body aches/ joint pain, cramps in legs x 5 days, vomiting last ngiht. Taking tylenol .  Pt states she couldn't catch her breath last night and EMS came and gave her oxygen  and states it helped.

## 2024-01-01 ENCOUNTER — Other Ambulatory Visit: Payer: Self-pay | Admitting: Family

## 2024-01-01 ENCOUNTER — Other Ambulatory Visit: Payer: Self-pay | Admitting: Cardiology

## 2024-01-02 ENCOUNTER — Other Ambulatory Visit: Payer: Self-pay | Admitting: Cardiology

## 2024-01-02 DIAGNOSIS — I5032 Chronic diastolic (congestive) heart failure: Secondary | ICD-10-CM

## 2024-01-09 ENCOUNTER — Other Ambulatory Visit: Payer: Self-pay | Admitting: Family

## 2024-01-11 DIAGNOSIS — F4321 Adjustment disorder with depressed mood: Secondary | ICD-10-CM | POA: Diagnosis not present

## 2024-01-12 ENCOUNTER — Other Ambulatory Visit: Payer: Self-pay | Admitting: Family

## 2024-01-15 ENCOUNTER — Encounter: Payer: Self-pay | Admitting: Family

## 2024-01-15 ENCOUNTER — Inpatient Hospital Stay: Attending: Oncology

## 2024-01-15 ENCOUNTER — Ambulatory Visit (INDEPENDENT_AMBULATORY_CARE_PROVIDER_SITE_OTHER): Admitting: Family

## 2024-01-15 VITALS — BP 150/88 | HR 81 | Ht 67.0 in | Wt 154.0 lb

## 2024-01-15 DIAGNOSIS — Z7984 Long term (current) use of oral hypoglycemic drugs: Secondary | ICD-10-CM | POA: Insufficient documentation

## 2024-01-15 DIAGNOSIS — R300 Dysuria: Secondary | ICD-10-CM

## 2024-01-15 DIAGNOSIS — E1122 Type 2 diabetes mellitus with diabetic chronic kidney disease: Secondary | ICD-10-CM | POA: Diagnosis not present

## 2024-01-15 DIAGNOSIS — F419 Anxiety disorder, unspecified: Secondary | ICD-10-CM | POA: Diagnosis not present

## 2024-01-15 DIAGNOSIS — Z0001 Encounter for general adult medical examination with abnormal findings: Secondary | ICD-10-CM

## 2024-01-15 DIAGNOSIS — F32A Depression, unspecified: Secondary | ICD-10-CM | POA: Diagnosis not present

## 2024-01-15 DIAGNOSIS — I130 Hypertensive heart and chronic kidney disease with heart failure and stage 1 through stage 4 chronic kidney disease, or unspecified chronic kidney disease: Secondary | ICD-10-CM | POA: Diagnosis not present

## 2024-01-15 DIAGNOSIS — Z1389 Encounter for screening for other disorder: Secondary | ICD-10-CM

## 2024-01-15 DIAGNOSIS — N1832 Chronic kidney disease, stage 3b: Secondary | ICD-10-CM

## 2024-01-15 DIAGNOSIS — G4709 Other insomnia: Secondary | ICD-10-CM

## 2024-01-15 DIAGNOSIS — Z794 Long term (current) use of insulin: Secondary | ICD-10-CM | POA: Insufficient documentation

## 2024-01-15 DIAGNOSIS — Z8673 Personal history of transient ischemic attack (TIA), and cerebral infarction without residual deficits: Secondary | ICD-10-CM | POA: Diagnosis not present

## 2024-01-15 DIAGNOSIS — D649 Anemia, unspecified: Secondary | ICD-10-CM | POA: Insufficient documentation

## 2024-01-15 DIAGNOSIS — Z0000 Encounter for general adult medical examination without abnormal findings: Secondary | ICD-10-CM

## 2024-01-15 DIAGNOSIS — N189 Chronic kidney disease, unspecified: Secondary | ICD-10-CM | POA: Diagnosis not present

## 2024-01-15 DIAGNOSIS — E785 Hyperlipidemia, unspecified: Secondary | ICD-10-CM

## 2024-01-15 DIAGNOSIS — Z79899 Other long term (current) drug therapy: Secondary | ICD-10-CM | POA: Diagnosis not present

## 2024-01-15 DIAGNOSIS — D472 Monoclonal gammopathy: Secondary | ICD-10-CM | POA: Diagnosis not present

## 2024-01-15 LAB — CBC WITH DIFFERENTIAL (CANCER CENTER ONLY)
Abs Immature Granulocytes: 0.02 K/uL (ref 0.00–0.07)
Basophils Absolute: 0 K/uL (ref 0.0–0.1)
Basophils Relative: 0 %
Eosinophils Absolute: 0 K/uL (ref 0.0–0.5)
Eosinophils Relative: 1 %
HCT: 46.5 % — ABNORMAL HIGH (ref 36.0–46.0)
Hemoglobin: 15 g/dL (ref 12.0–15.0)
Immature Granulocytes: 1 %
Lymphocytes Relative: 35 %
Lymphs Abs: 1.3 K/uL (ref 0.7–4.0)
MCH: 29.8 pg (ref 26.0–34.0)
MCHC: 32.3 g/dL (ref 30.0–36.0)
MCV: 92.4 fL (ref 80.0–100.0)
Monocytes Absolute: 0.1 K/uL (ref 0.1–1.0)
Monocytes Relative: 4 %
Neutro Abs: 2.1 K/uL (ref 1.7–7.7)
Neutrophils Relative %: 59 %
Platelet Count: 113 K/uL — ABNORMAL LOW (ref 150–400)
RBC: 5.03 MIL/uL (ref 3.87–5.11)
RDW: 17.5 % — ABNORMAL HIGH (ref 11.5–15.5)
WBC Count: 3.6 K/uL — ABNORMAL LOW (ref 4.0–10.5)
nRBC: 0 % (ref 0.0–0.2)

## 2024-01-15 LAB — CMP (CANCER CENTER ONLY)
ALT: 29 U/L (ref 0–44)
AST: 30 U/L (ref 15–41)
Albumin: 4 g/dL (ref 3.5–5.0)
Alkaline Phosphatase: 86 U/L (ref 38–126)
Anion gap: 10 (ref 5–15)
BUN: 19 mg/dL (ref 8–23)
CO2: 22 mmol/L (ref 22–32)
Calcium: 9.3 mg/dL (ref 8.9–10.3)
Chloride: 106 mmol/L (ref 98–111)
Creatinine: 1.34 mg/dL — ABNORMAL HIGH (ref 0.44–1.00)
GFR, Estimated: 41 mL/min — ABNORMAL LOW (ref >60)
Glucose, Bld: 79 mg/dL (ref 70–99)
Potassium: 4.2 mmol/L (ref 3.5–5.1)
Sodium: 138 mmol/L (ref 135–145)
Total Bilirubin: 0.6 mg/dL (ref 0.0–1.2)
Total Protein: 7.3 g/dL (ref 6.5–8.1)

## 2024-01-15 LAB — POCT URINALYSIS DIPSTICK
Bilirubin, UA: NEGATIVE
Glucose, UA: POSITIVE — AB
Ketones, UA: NEGATIVE
Leukocytes, UA: NEGATIVE
Nitrite, UA: NEGATIVE
Protein, UA: POSITIVE — AB
Spec Grav, UA: 1.015 (ref 1.010–1.025)
Urobilinogen, UA: 0.2 U/dL
pH, UA: 5.5 (ref 5.0–8.0)

## 2024-01-15 LAB — IRON AND TIBC
Iron: 45 ug/dL (ref 28–170)
Saturation Ratios: 17 % (ref 10.4–31.8)
TIBC: 259 ug/dL (ref 250–450)
UIBC: 214 ug/dL

## 2024-01-15 LAB — POC CREATINE & ALBUMIN,URINE
Creatinine, POC: 200 mg/dL
Microalbumin Ur, POC: 150 mg/L

## 2024-01-15 LAB — FERRITIN: Ferritin: 159 ng/mL (ref 11–307)

## 2024-01-15 LAB — POCT CBG (FASTING - GLUCOSE)-MANUAL ENTRY: Glucose Fasting, POC: 91 mg/dL (ref 70–99)

## 2024-01-15 MED ORDER — CLONAZEPAM 1 MG PO TABS: ORAL_TABLET | ORAL | 3 refills | Status: DC

## 2024-01-15 NOTE — Progress Notes (Signed)
 Annual Wellness Visit  Patient: Jamie Murray, Female    DOB: 02-May-1946, 78 y.o.   MRN: [id number] Visit Date: 01/15/2024  Today's Provider: ALAN CHRISTELLA ARRANT, FNP  Subjective:    Chief Complaint  Patient presents with   Annual Exam    AWV   Jamie Murray is a 78 y.o. female who presents today for her Annual Wellness Visit.  Patient is here today for her AWV.  She has been doing well physically, but is having some continued issues with her grieving process after her husband passed away last year.  She does see a therapist and says that they have been working together for this.   Due for labs.  No other major concerns today    Past Medical History:  Diagnosis Date   Abnormal electrocardiogram 11/25/2013   Formatting of this note might be different from the original. Last Assessment & Plan: Resolved when went to the ER.  Will f/u with Dr. Fernand Note: Unchanged Formatting of this note might be different from the original. Note: Unchanged Formatting of this note might be different from the original.  Formatting of this note might be different from the original. Last Assessment & Plan: Resolved when we   Allergy    Anemia    Anxiety    Bilateral hand pain 07/25/2018   Cerebral infarction, unspecified (HCC) 01/26/2015   Chest pain 08/09/2016   Chest pain, rule out acute myocardial infarction 08/09/2016   Chronic diastolic heart failure (HCC)    COPD (chronic obstructive pulmonary disease) (HCC)    Depression    Diabetes (HCC)    Diarrhea, functional 01/25/2016   Edema of lower extremity 02/03/2020   Elevated troponin level 01/09/2020   Enlarged RV (right ventricle)    GERD (gastroesophageal reflux disease)    Hepatic steatosis    History of stroke 08/26/2022   Formatting of this note might be different from the original. Last Assessment & Plan:  Formatting of this note might be different from the original.  Continue ASA, statin     Hyperlipidemia     Hypertension    Hypokalemia 12/22/2015   Hypomagnesemia 03/22/2016   Insomnia    Macrocytosis 01/25/2016   Migraines    Nutmeg liver    Osteoporosis    Palpitations 11/25/2013   Formatting of this note might be different from the original. Note: Unchanged   Pulmonary hypertension (HCC)    [facility] spotted fever    Severe pulmonary arterial systolic hypertension (HCC)    Severe tricuspid regurgitation by prior echocardiogram    Sleep apnea    CPAP   Transient cerebral ischemia 2016   Type 2 diabetes mellitus with hyperglycemia (HCC) 02/14/2012   Formatting of this note might be different from the original. Last Assessment & Plan: Hgb A1C is 7.1%.  Don't make any changes now.  Will hopefully increase exercise when pulmonary hypertension is properly treated.  ? Pulmonary rehab Note: Unchanged Formatting of this note might be different from the original. Formatting of this note might be different from the original.  Formatting of this note    Vertigo    every 2-3 months   Past Surgical History:  Procedure Laterality Date   ABDOMINAL HYSTERECTOMY     CORONARY ANGIOPLASTY     ESOPHAGOGASTRODUODENOSCOPY (EGD) WITH PROPOFOL  N/A 08/24/2015   Procedure: ESOPHAGOGASTRODUODENOSCOPY (EGD) WITH PROPOFOL  with dialation;  Surgeon: Rogelia Copping, MD;  Location: [facility];  Service: Endoscopy;  Laterality: N/A;  Diabetic -  oral meds   RIGHT HEART CATH N/A 08/24/2023   Procedure: RIGHT HEART CATH;  Surgeon: Rolan Ezra RAMAN, MD;  Location: [facility];  Service: Cardiovascular;  Laterality: N/A;   RIGHT/LEFT HEART CATH AND CORONARY/GRAFT ANGIOGRAPHY N/A 01/13/2020   Procedure: RIGHT/LEFT HEART CATH AND CORONARY/GRAFT ANGIOGRAPHY;  Surgeon: Darron Deatrice LABOR, MD;  Location: [facility] INVASIVE CV LAB;  Service: Cardiovascular;  Laterality: N/A;   TUBAL LIGATION     Family History  Problem Relation Age of Onset   Diabetes Mother    Hypertension Mother    Stroke Mother    Heart disease  Mother        MI   Cancer Father        pancreatic   Stroke Father    Hypertension Sister    Diabetes Brother    Diabetes Maternal Grandfather    Diabetes Sister    Diabetes Sister    Diabetes Brother    Diabetes Brother    Social History   Socioeconomic History   Marital status: Widowed    Spouse name: Rutha   Number of children: 3   Years of education: 14   Highest education level: Not on file  Occupational History    Comment: work part time, day care   Occupation: Retired  Tobacco Use   Smoking status: Never   Smokeless tobacco: Never  Vaping Use   Vaping status: Never Used  Substance and Sexual Activity   Alcohol use: No    Alcohol/week: 0.0 standard drinks of alcohol   Drug use: No   Sexual activity: Not Currently    Birth control/protection: None, Post-menopausal  Other Topics Concern   Not on file  Social History Narrative   Caffeine use- coffee 2 cups daily, Coke once a week, green tea daily   Social Drivers of Corporate investment banker Strain: Not on file  Food Insecurity: No Food Insecurity (05/15/2023)   Hunger Vital Sign    Worried About Running Out of Food in the Last Year: Never true    Ran Out of Food in the Last Year: Never true  Transportation Needs: No Transportation Needs (05/15/2023)   PRAPARE - Administrator, Civil Service (Medical): No    Lack of Transportation (Non-Medical): No  Physical Activity: Not on file  Stress: Not on file  Social Connections: Not on file  Intimate Partner Violence: Not At Risk (05/15/2023)   Humiliation, Afraid, Rape, and Kick questionnaire    Fear of Current or Ex-Partner: No    Emotionally Abused: No    Physically Abused: No    Sexually Abused: No    Medications: Outpatient Medications Prior to Visit  Medication Sig   ACCU-CHEK GUIDE TEST test strip TEST BLOOD SUGAR THREE TIMES DAILY AS NEEDED FOR OTHER( LOW BLOOD SUGAR SYMPTOMS)   ambrisentan  (LETAIRIS ) 10 MG tablet Take 1 tablet (10 mg  total) by mouth daily.   amLODipine  (NORVASC ) 10 MG tablet Take 1 tablet (10 mg total) by mouth daily.   Cholecalciferol  (VITAMIN D3) 125 MCG (5000 UT) CAPS Take 5,000 Units by mouth daily.   dapagliflozin  propanediol (FARXIGA ) 10 MG TABS tablet Take 1 tablet (10 mg total) by mouth daily before breakfast.   diphenoxylate -atropine  (LOMOTIL ) 2.5-0.025 MG tablet Take 1 tablet by mouth 4 (four) times daily as needed for diarrhea or loose stools.   escitalopram  (LEXAPRO ) 10 MG tablet TAKE 1 TABLET BY MOUTH EVERY MORNING   esomeprazole  (NEXIUM ) 40 MG capsule TAKE 1  CAPSULE(40 MG) BY MOUTH DAILY   Fe Fum-FA-B Cmp-C-Zn-Mg-Mn-Cu (HEMOCYTE PLUS) 106-1 MG CAPS Take 1 capsule by mouth daily.   furosemide  (LASIX ) 20 MG tablet Take 3 tablets (60 mg total) by mouth 3 (three) times daily. PLEASE SCHEDULE APPOINTMENT FOR MORE REFILLS   insulin  aspart (NOVOLOG ) 100 UNIT/ML injection Inject 4 Units into the skin 3 (three) times daily as needed for high blood sugar (Bs greater than 200).   Insulin  Degludec (TRESIBA [place]) Inject 16 Units into the skin at bedtime.   Insulin  Pen Needle (B-D UF III MINI PEN NEEDLES) 31G X 5 MM MISC 1 Needle by Other route 2 (two) times daily.   levothyroxine  (SYNTHROID ) 75 MCG tablet TAKE 1 TABLET BY MOUTH EVERY MORNING ON AN EMPTY STOMACH   loperamide  (IMODIUM ) 2 MG capsule Take 4 mg by mouth daily as needed for diarrhea or loose stools.   loratadine  (CLARITIN ) 10 MG tablet Take 10 mg by mouth daily as needed for allergies.   meclizine  (ANTIVERT ) 25 MG tablet Take 25 mg by mouth 3 (three) times daily as needed for dizziness (Migraine).   metoprolol  succinate (TOPROL -XL) 25 MG 24 hr tablet Take 1 tablet (25 mg total) by mouth daily.   Multiple Vitamins-Minerals (MULTI ADULT GUMMIES) CHEW Chew 1 tablet by mouth daily. Centrum   ORENITRAM  0.25 MG TBCR Take 0.75 mg by mouth See admin instructions. Take 0.75 mg at 10 am, 1600 and  2200 along with 1 mg. This week the Nurse will call  and give  her the dose for next week. (White Pill)   ORENITRAM  1 MG TBCR Take 1 mg by mouth See admin instructions. Take 1 mg at 10 am, 1600 and 2200 for this week, The nurse will call and give her the dose for next week (Yellow Pill)   potassium chloride  SA (KLOR-CON  M) 20 MEQ tablet Take 1 tablet (20 mEq total) by mouth once for 1 dose. PLEASE SCHEDULE APPOINTMENT FOR MORE REFILLS   rosuvastatin  (CRESTOR ) 10 MG tablet TAKE 1 TABLET BY MOUTH EVERY DAY   sotatercept (WINREVAIR ) 2 x 60 MG subcutaneous injection Inject 49.56 mg into the skin every 21 ( twenty-one) days.   tadalafil , PAH, (ADCIRCA ) 20 MG tablet Take 2 tablets (40 mg total) by mouth daily.   Treprostinil  Diolamine ER (ORENITRAM ) 0.125 MG TBCR Take 0.125 mg by mouth 3 (three) times daily. (Blue Pill)   Treprostinil  Diolamine ER (ORENITRAM ) 2.5 MG TBCR Take 2.5 mg by mouth 3 (three) times daily.   [DISCONTINUED] clonazePAM  (KLONOPIN ) 1 MG tablet TAKE 1 TABLET BY MOUTH UP TO TWICE DAILY AS NEEDED FOR ANXIETY   No facility-administered medications prior to visit.    Allergies  Allergen Reactions   Atorvastatin      Note: joint pain   Fluoxetine Other (See Comments)    felt crazy   Levemir  [Insulin  Detemir] Other (See Comments)    Bruising   Prozac [Fluoxetine Hcl] Other (See Comments)    felt crazy    Patient Care Team: Orlean Alan HERO, FNP as PCP - General (Family Medicine) Fernand Denyse LABOR, MD as Consulting Physician (Cardiology) Babara Call, MD as Consulting Physician (Oncology)  Review of Systems  Psychiatric/Behavioral:  Positive for sleep disturbance. The patient is nervous/anxious.   All other systems reviewed and are negative.     Objective:    Vitals: BP (!) 150/88   Pulse 81   Ht 5' 7 (1.702 m)   Wt 154 lb (69.9 kg)   LMP  (LMP Unknown)  SpO2 93%   BMI 24.12 kg/m    Physical Exam   Most recent functional status assessment:    05/15/2023    9:19 AM  In your present state of health, do you have any  difficulty performing the following activities:  Hearing? 0  Difficulty concentrating or making decisions? 0  Walking or climbing stairs? 1  Dressing or bathing? 0  Doing errands, shopping? 0  Preparing Food and eating ? N  Using the Toilet? N  In the past six months, have you accidently leaked urine? N  Do you have problems with loss of bowel control? N  Managing your Medications? N  Managing your Finances? N  Housekeeping or managing your Housekeeping? N    Most recent fall risk assessment:    05/23/2023    1:02 PM  Fall Risk   Falls in the past year? 0  Number falls in past yr: 0  Injury with Fall? 0  Risk for fall due to : Impaired balance/gait     Most recent depression screenings:    07/03/2023    2:18 PM 05/24/2023   12:39 PM  PHQ 2/9 Scores  PHQ - 2 Score 5 5  PHQ- 9 Score 14 11    Most recent cognitive screening:     No data to display          Results for orders placed or performed in visit on 01/15/24  POCT CBG (Fasting - Glucose)  Result Value Ref Range   Glucose Fasting, POC 91 70 - 99 mg/dL  POC CREATINE & ALBUMIN ,URINE  Result Value Ref Range   Microalbumin Ur, POC 150 mg/L   Creatinine, POC 200 mg/dL   Albumin /Creatinine Ratio, Urine, POC 30-300   POCT Urinalysis Dipstick (81002)  Result Value Ref Range   Color, UA Yellow    Clarity, UA Clear    Glucose, UA Positive (A) Negative   Bilirubin, UA Negative    Ketones, UA Negative    Spec Grav, UA 1.015 1.010 - 1.025   Blood, UA Trace    pH, UA 5.5 5.0 - 8.0   Protein, UA Positive (A) Negative   Urobilinogen, UA 0.2 0.2 or 1.0 E.U./dL   Nitrite, UA Negative    Leukocytes, UA Negative Negative   Appearance Clear    Odor No        Assessment & Plan:      Annual wellness visit done today including the all of the following: Reviewed patient's Family Medical History Reviewed and updated list of patient's medical providers Assessment of cognitive impairment was done Assessed  patient's functional ability Established a written schedule for health screening services Health Risk Assessent Completed and Reviewed  Exercise Activities and Dietary recommendations  Goals   None     Immunization History  Administered Date(s) Administered   Influenza Inj Mdck Quad With Preservative 04/06/2017, 03/08/2018   Influenza, High Dose Seasonal PF 03/07/2016, 04/24/2017   Influenza,inj,Quad PF,6+ Mos 02/25/2015   Pneumococcal Conjugate-13 07/08/2014   Pneumococcal Polysaccharide-23 07/08/2013   Td 11/17/2006   Zoster, Live 11/17/2006    Health Maintenance  Topic Date Due   COVID-19 Vaccine (1) Never done   Zoster Vaccines- Shingrix (1 of 2) 09/17/1965   Hepatitis B Vaccines (1 of 3 - Risk 3-dose series) Never done   DTaP/Tdap/Td (2 - Tdap) 11/16/2016   FOOT EXAM  08/25/2017   OPHTHALMOLOGY EXAM  06/16/2023   INFLUENZA VACCINE  01/05/2024   HEMOGLOBIN A1C  04/14/2024   Diabetic  kidney evaluation - eGFR measurement  12/28/2024   Diabetic kidney evaluation - Urine ACR  01/14/2025   Medicare Annual Wellness (AWV)  01/14/2025   Pneumococcal Vaccine: 50+ Years  Completed   DEXA SCAN  Completed   Hepatitis C Screening  Completed   HPV VACCINES  Aged Out   Meningococcal B Vaccine  Aged Out   Colonoscopy  Discontinued     Discussed health benefits of physical activity, and encouraged her to engage in regular exercise appropriate for her age and condition.        ALAN CHRISTELLA ARRANT, FNP   01/15/2024  This document may have been prepared by Dragon Voice Recognition software and as such may include unintentional dictation errors.

## 2024-01-16 ENCOUNTER — Other Ambulatory Visit: Payer: Medicare PPO

## 2024-01-16 LAB — URINALYSIS, ROUTINE W REFLEX MICROSCOPIC
Bilirubin, UA: NEGATIVE
Ketones, UA: NEGATIVE
Leukocytes,UA: NEGATIVE
Nitrite, UA: NEGATIVE
RBC, UA: NEGATIVE
Specific Gravity, UA: 1.015 (ref 1.005–1.030)
Urobilinogen, Ur: 0.2 mg/dL (ref 0.2–1.0)
pH, UA: 6 (ref 5.0–7.5)

## 2024-01-16 LAB — KAPPA/LAMBDA LIGHT CHAINS
Kappa free light chain: 43.1 mg/L — ABNORMAL HIGH (ref 3.3–19.4)
Kappa, lambda light chain ratio: 2.55 — ABNORMAL HIGH (ref 0.26–1.65)
Lambda free light chains: 16.9 mg/L (ref 5.7–26.3)

## 2024-01-16 LAB — MICROSCOPIC EXAMINATION
Bacteria, UA: NONE SEEN
Casts: NONE SEEN /LPF
RBC, Urine: NONE SEEN /HPF (ref 0–2)

## 2024-01-17 LAB — URINE CULTURE

## 2024-01-18 LAB — MULTIPLE MYELOMA PANEL, SERUM
Albumin SerPl Elph-Mcnc: 3.4 g/dL (ref 2.9–4.4)
Albumin/Glob SerPl: 1.1 (ref 0.7–1.7)
Alpha 1: 0.3 g/dL (ref 0.0–0.4)
Alpha2 Glob SerPl Elph-Mcnc: 0.8 g/dL (ref 0.4–1.0)
B-Globulin SerPl Elph-Mcnc: 0.9 g/dL (ref 0.7–1.3)
Gamma Glob SerPl Elph-Mcnc: 1.3 g/dL (ref 0.4–1.8)
Globulin, Total: 3.3 g/dL (ref 2.2–3.9)
IgA: 45 mg/dL — ABNORMAL LOW (ref 64–422)
IgG (Immunoglobin G), Serum: 713 mg/dL (ref 586–1602)
IgM (Immunoglobulin M), Srm: 949 mg/dL — ABNORMAL HIGH (ref 26–217)
M Protein SerPl Elph-Mcnc: 0.7 g/dL — ABNORMAL HIGH
Total Protein ELP: 6.7 g/dL (ref 6.0–8.5)

## 2024-01-19 ENCOUNTER — Ambulatory Visit (HOSPITAL_COMMUNITY): Payer: Self-pay | Admitting: Cardiology

## 2024-01-19 ENCOUNTER — Ambulatory Visit (HOSPITAL_COMMUNITY)
Admission: RE | Admit: 2024-01-19 | Discharge: 2024-01-19 | Disposition: A | Discharge status: Home / Self Care | Source: Ambulatory Visit | Attending: Cardiology | Admitting: Cardiology

## 2024-01-19 ENCOUNTER — Encounter (HOSPITAL_COMMUNITY): Payer: Self-pay | Admitting: Cardiology

## 2024-01-19 VITALS — BP 130/80 | HR 60

## 2024-01-19 DIAGNOSIS — G4733 Obstructive sleep apnea (adult) (pediatric): Secondary | ICD-10-CM | POA: Diagnosis not present

## 2024-01-19 DIAGNOSIS — Z7984 Long term (current) use of oral hypoglycemic drugs: Secondary | ICD-10-CM | POA: Diagnosis not present

## 2024-01-19 DIAGNOSIS — Z794 Long term (current) use of insulin: Secondary | ICD-10-CM | POA: Insufficient documentation

## 2024-01-19 DIAGNOSIS — I272 Pulmonary hypertension, unspecified: Secondary | ICD-10-CM | POA: Insufficient documentation

## 2024-01-19 DIAGNOSIS — I4510 Unspecified right bundle-branch block: Secondary | ICD-10-CM | POA: Diagnosis not present

## 2024-01-19 DIAGNOSIS — Z8249 Family history of ischemic heart disease and other diseases of the circulatory system: Secondary | ICD-10-CM | POA: Insufficient documentation

## 2024-01-19 DIAGNOSIS — N183 Chronic kidney disease, stage 3 unspecified: Secondary | ICD-10-CM | POA: Diagnosis not present

## 2024-01-19 DIAGNOSIS — I5032 Chronic diastolic (congestive) heart failure: Secondary | ICD-10-CM | POA: Diagnosis not present

## 2024-01-19 DIAGNOSIS — Z79899 Other long term (current) drug therapy: Secondary | ICD-10-CM | POA: Insufficient documentation

## 2024-01-19 DIAGNOSIS — I130 Hypertensive heart and chronic kidney disease with heart failure and stage 1 through stage 4 chronic kidney disease, or unspecified chronic kidney disease: Secondary | ICD-10-CM | POA: Insufficient documentation

## 2024-01-19 DIAGNOSIS — K7460 Unspecified cirrhosis of liver: Secondary | ICD-10-CM | POA: Diagnosis not present

## 2024-01-19 DIAGNOSIS — R0609 Other forms of dyspnea: Secondary | ICD-10-CM | POA: Diagnosis not present

## 2024-01-19 DIAGNOSIS — E1122 Type 2 diabetes mellitus with diabetic chronic kidney disease: Secondary | ICD-10-CM | POA: Diagnosis not present

## 2024-01-19 DIAGNOSIS — J984 Other disorders of lung: Secondary | ICD-10-CM | POA: Insufficient documentation

## 2024-01-19 LAB — BASIC METABOLIC PANEL WITH GFR
Anion gap: 10 (ref 5–15)
BUN: 26 mg/dL — ABNORMAL HIGH (ref 8–23)
CO2: 23 mmol/L (ref 22–32)
Calcium: 9.4 mg/dL (ref 8.9–10.3)
Chloride: 105 mmol/L (ref 98–111)
Creatinine, Ser: 1.6 mg/dL — ABNORMAL HIGH (ref 0.44–1.00)
GFR, Estimated: 33 mL/min — ABNORMAL LOW (ref >60)
Glucose, Bld: 83 mg/dL (ref 70–99)
Potassium: 4.9 mmol/L (ref 3.5–5.1)
Sodium: 138 mmol/L (ref 135–145)

## 2024-01-19 LAB — BRAIN NATRIURETIC PEPTIDE: B Natriuretic Peptide: 69.4 pg/mL (ref 0.0–100.0)

## 2024-01-19 NOTE — Patient Instructions (Signed)
 Great to see you today!!!  Medication Changes:  None, continue current medications  Lab Work:  Labs done today, your results will be available in MyChart, we will contact you for abnormal readings.   Follow-Up in: 3 months (November), **PLEASE CALL OUR OFFICE IN OCTOBER TO SCHEDULE THIS APPOINTMENT    At the [facility], you and your health needs are our priority. We have a designated team specialized in the treatment of Heart Failure. This Care Team includes your primary Heart Failure Specialized Cardiologist (physician), Advanced Practice Providers (APPs- Physician Assistants and Nurse Practitioners), and Pharmacist who all work together to provide you with the care you need, when you need it.   You may see any of the following providers on your designated Care Team at your next follow up:  Dr. Toribio Fuel Dr. Ezra Shuck Dr. Ria Commander Dr. Odis Brownie Greig Mosses, NP [place]affie Shed, [place] [facility] [place], [place] Beckey Coe, NP [place] Lee, NP Tinnie Redman, PharmD   Please be sure to bring in all your medications bottles to every appointment.   Need to Contact Us :  If you have any questions or concerns before your next appointment please send us  a message through [place] or call our office at [phone].    TO LEAVE A MESSAGE FOR THE NURSE SELECT OPTION 2, PLEASE LEAVE A MESSAGE INCLUDING: YOUR NAME DATE OF BIRTH CALL BACK NUMBER REASON FOR CALL**this is important as we prioritize the call backs  YOU WILL RECEIVE A CALL BACK THE SAME DAY AS LONG AS YOU CALL BEFORE 4:00 PM

## 2024-01-19 NOTE — Progress Notes (Signed)
 6 Min Walk Test Completed  Pt ambulated 1039ft (304.80m) O2 Sat ranged 100-93  HR ranged 106-68

## 2024-01-20 ENCOUNTER — Other Ambulatory Visit (HOSPITAL_COMMUNITY): Payer: Self-pay | Admitting: Cardiology

## 2024-01-21 NOTE — Progress Notes (Signed)
 PCP: Orlean Alan HERO, FNP HF Cardiology: Dr. Rolan  Chief complaint: Pulmonary HTN  78 y.o. with history of HTN, type 2 diabetes, OSA on CPAP, and pulmonary hypertension/RV failure was referred by Dr. Darron for evaluation of pulmonary hypertension. It appears that PH was diagnosed as early as 2018 by echo.  Echo in 2020 showed PA systolic pressure 85 mmHg.  She says that she was told that the pulmonary hypertension was due to OSA.  She has been compliant with CPAP. She reports exertional dyspnea for about 4 years now.   In 8/21, she was admitted to [facility] with CHF.  Echo showed EF 55-60%, mild LVH, D-shaped septum, severe RV dilation with moderate RV systolic dysfunction, PASP 83, moderate TR, moderate PI, dilated IVC.  RHC/LHC showed no coronary disease, severe pulmonary hypertension with PVR 7.38 and preserved cardiac output.  High resolution CT chest in 9/21 showed no evidence for fibrotic ILD.    She saw rheumatology and was given no definite rheumatological diagnosis.    Echo in 7/22 showed EF 65-70% with moderate LVH, RV moderately dilated with moderately decreased RV systolic function, mild RVH, D-shaped septum, PASP 85 mmHg, moderate PR, normal IVC.  Echo in 9/23 showed EF 55-60%, D-shaped septum, severe RV dilation with severe RV dysfunction, mild TR, PASP 50 mmHg.   She is off Uptravi , unable to tolerate even the lowest dose due to diarrhea and headache.  She tried Tyvaso  DPI; she developed headaches and did not like inhaling and found the device hard to manipulate so she stopped it.  She was not interested in trying the inhaler form of Tyvaso .    Admitted 3/24 CHF/RV failure, no improvement after increase in Lasix . Diuresed with IV lasix , eventually transitioned to po Lasix  40 bid and discharged home, weight 168.5 lbs. Seen in ED 09/09/22 with dyspnea. BNP >2k, but felt she was stable for discharge, with close HF follow up.   Echo in 11/24 showed EF 60-65%, moderate LVH, mildly D-shaped  interventricular septum, mild RV enlargement and mildly decreased systolic function, unable to estimate PA systolic pressure.   RHC was repeated in 3/25, PA 74/18 mean 36 with PVR 8 WU, CI 2.14.   Today she returns for RV failure/pulmonary hypertension follow up. Weight is down 4 lbs.  She is now taking Lasix  20 to 40 mg daily, depending on weight. She is using CPAP nightly. She is now on orenitram  3.25 mg tid but is still having headaches at night a couple times a week. They are tolerable, she uses Tylenol .  She has chronic loose stool, this predated use of pulmonary hypertension medications.  She says that her breathing is better since starting sotatercept.  She is able to walk 1/2 block and back with no problems.  No lightheadedness or chest pain.  .   Labs (8/21): K 3.3, creatinine 1.39, ANA negative, anti-Sm negative, anti-SCL-70 negative, ANCA negative, RF elevated 52 but CCP negative, ESR 67, CRP 2.2 Labs (9/21): K 4.5, creatinine 1.8 => 1.45, anti-centromere Ab negative, BNP 309 Labs (4/24): K 3.5, creatinine 1.65 Labs (5/24): LDL 75 Labs (8/24): K 3.8, creatinine 1.27, hgb 9.3, LDL 50 Labs (9/24): K 4.4, creatinine 1.3 Labs (1/25): K 4, creatinine 1.39 Labs (3/25): K 4, creatinine 1.97, BNP 865 Labs (8/25): Hgb 15, K 4.2, creatinine 1.34  ECG (personally reviewed): NSR, iRBBB  6 minute walk (8/21): 154 m 6 minute walk (11/21): 183 m 6 minute walk (3/22): 351 m 6 minute walk (11/22): 381 m 6  minute walk (2/23): 290 m (off Uptravi ) 6 minute walk (8/24): 335 m 6 minute walk (8/25): 305 m  PMH: 1. HTN 2. Type 2 diabetes 3. OSA: Uses CPAP.  4. GERD 5. Hypothyroidism 6. Pulmonary hypertension/RV failure: Pulmonary hypertension diagnosed 2018 by echo.  - PFTs (6/18): moderate restriction - Echo 2020 with PASP 85 mmHg.  - Echo (8/21): EF 55-60%, mild LVH, D-shaped septum, severe RV dilation with moderate RV systolic dysfunction, PASP 83, moderate TR, moderate PI, dilated IVC.  -  LHC/RHC (8/21): Normal coronaries; mean RA 3, PA 82/21 mean 44, mean PCWP 14, CI 2.2, PVR 7.38 WU.  - V/Q scan (8/21): No evidence for chronic PE.  - Serologic workup: ANA negative, anti-Sm negative, anti-SCL-70 negative, ANCA negative, RF elevated 52 but CCP negative, ESR 67, CRP 2.2, anti-centromere negative - High resolution CT chest: No fibrotic ILD.  - Echo (7/22): EF 65-70% with moderate LVH, RV moderately dilated with moderately decreased RV systolic function, mild RVH, D-shaped septum, PASP 85 mmHg, moderate PR, normal IVC.  - Echo (9/23): EF 55-60%, D-shaped septum, severe RV dilation with severe RV dysfunction, mild TR, PASP 50 mmHg. - Echo (11/24): EF 60-65%, moderate LVH, mildly D-shaped interventricular septum, mild RV enlargement and mildly decreased systolic function, unable to estimate PA systolic pressure.  - RHC (3/25): mean RA 8, PA 74/18 mean 36, mean PCWP 5, CI 2.14, PVR 8 WU 7. CKD stage 3.  8. Cirrhosis: Noted on 9/23 CT abdomen/pelvis. Suspect cardiogenic.  9. IgM monoclonal gammopathy  Social History   Socioeconomic History   Marital status: Widowed    Spouse name: Rutha   Number of children: 3   Years of education: 14   Highest education level: Not on file  Occupational History    Comment: work part time, day care   Occupation: Retired  Tobacco Use   Smoking status: Never   Smokeless tobacco: Never  Vaping Use   Vaping status: Never Used  Substance and Sexual Activity   Alcohol use: No    Alcohol/week: 0.0 standard drinks of alcohol   Drug use: No   Sexual activity: Not Currently    Birth control/protection: None, Post-menopausal  Other Topics Concern   Not on file  Social History Narrative   Caffeine use- coffee 2 cups daily, Coke once a week, green tea daily   Social Drivers of Corporate investment banker Strain: Not on file  Food Insecurity: No Food Insecurity (05/15/2023)   Hunger Vital Sign    Worried About Running Out of Food in the Last  Year: Never true    Ran Out of Food in the Last Year: Never true  Transportation Needs: No Transportation Needs (05/15/2023)   PRAPARE - Administrator, Civil Service (Medical): No    Lack of Transportation (Non-Medical): No  Physical Activity: Not on file  Stress: Not on file  Social Connections: Not on file  Intimate Partner Violence: Not At Risk (05/15/2023)   Humiliation, Afraid, Rape, and Kick questionnaire    Fear of Current or Ex-Partner: No    Emotionally Abused: No    Physically Abused: No    Sexually Abused: No   Family History  Problem Relation Age of Onset   Diabetes Mother    Hypertension Mother    Stroke Mother    Heart disease Mother        MI   Cancer Father        pancreatic   Stroke Father  Hypertension Sister    Diabetes Brother    Diabetes Maternal Grandfather    Diabetes Sister    Diabetes Sister    Diabetes Brother    Diabetes Brother    ROS: All systems reviewed and negative except as per HPI.   Current Outpatient Medications  Medication Sig Dispense Refill   ACCU-CHEK GUIDE TEST test strip TEST BLOOD SUGAR THREE TIMES DAILY AS NEEDED FOR OTHER( LOW BLOOD SUGAR SYMPTOMS) 100 strip 3   ambrisentan  (LETAIRIS ) 10 MG tablet Take 1 tablet (10 mg total) by mouth daily. 90 tablet 3   amLODipine  (NORVASC ) 10 MG tablet Take 1 tablet (10 mg total) by mouth daily. 90 tablet 3   Cholecalciferol  (VITAMIN D3) 125 MCG (5000 UT) CAPS Take 5,000 Units by mouth daily.     clonazePAM  (KLONOPIN ) 1 MG tablet TAKE 1 TABLET BY MOUTH UP TO TWICE DAILY AS NEEDED FOR ANXIETY 60 tablet 3   dapagliflozin  propanediol (FARXIGA ) 10 MG TABS tablet Take 1 tablet (10 mg total) by mouth daily before breakfast. 90 tablet 3   escitalopram  (LEXAPRO ) 10 MG tablet TAKE 1 TABLET BY MOUTH EVERY MORNING 90 tablet 1   esomeprazole  (NEXIUM ) 40 MG capsule TAKE 1 CAPSULE(40 MG) BY MOUTH DAILY 90 capsule 1   Fe Fum-FA-B Cmp-C-Zn-Mg-Mn-Cu (HEMOCYTE PLUS) 106-1 MG CAPS Take 1 capsule by  mouth daily. 90 capsule 1   furosemide  (LASIX ) 20 MG tablet Take 20 mg by mouth daily. Can take additional as per weight     insulin  aspart (NOVOLOG ) 100 UNIT/ML injection Inject 4 Units into the skin 3 (three) times daily as needed for high blood sugar (Bs greater than 200).     Insulin  Degludec (TRESIBA [place]) Inject 16 Units into the skin at bedtime.     Insulin  Pen Needle (B-D UF III MINI PEN NEEDLES) 31G X 5 MM MISC 1 Needle by Other route 2 (two) times daily. 100 each 5   levothyroxine  (SYNTHROID ) 75 MCG tablet TAKE 1 TABLET BY MOUTH EVERY MORNING ON AN EMPTY STOMACH 30 tablet 0   loratadine  (CLARITIN ) 10 MG tablet Take 10 mg by mouth daily as needed for allergies.     meclizine  (ANTIVERT ) 25 MG tablet Take 25 mg by mouth 3 (three) times daily as needed for dizziness (Migraine).     metoprolol  succinate (TOPROL -XL) 25 MG 24 hr tablet Take 1 tablet (25 mg total) by mouth daily. 90 tablet 3   Multiple Vitamins-Minerals (MULTI ADULT GUMMIES) CHEW Chew 1 tablet by mouth daily. Centrum     ORENITRAM  0.25 MG TBCR Take 0.75 mg by mouth See admin instructions. Take 0.75 mg at 10 am, 1600 and  2200 along with 1 mg. This week the Nurse will call  and give her the dose for next week. (White Pill)     ORENITRAM  1 MG TBCR Take 1 mg by mouth See admin instructions. Take 1 mg at 10 am, 1600 and 2200 for this week, The nurse will call and give her the dose for next week (Yellow Pill)     potassium chloride  SA (KLOR-CON  M) 20 MEQ tablet Take 20 mEq by mouth daily.     rosuvastatin  (CRESTOR ) 10 MG tablet TAKE 1 TABLET BY MOUTH EVERY DAY 90 tablet 2   sotatercept (WINREVAIR ) 2 x 60 MG subcutaneous injection Inject 49.56 mg into the skin every 21 ( twenty-one) days. 1 kit 11   tadalafil , PAH, (ADCIRCA ) 20 MG tablet Take 2 tablets (40 mg total) by mouth daily. 60 tablet  11   Treprostinil  Diolamine ER (ORENITRAM ) 0.125 MG TBCR Take 0.125 mg by mouth 3 (three) times daily. (Blue Pill)     Treprostinil  Diolamine ER  (ORENITRAM ) 2.5 MG TBCR Take 2.5 mg by mouth 3 (three) times daily.     No current facility-administered medications for this encounter.   Wt Readings from Last 3 Encounters:  01/15/24 69.9 kg (154 lb)  10/13/23 68.8 kg (151 lb 9.6 oz)  09/08/23 70.8 kg (156 lb)   BP 130/80   Pulse 60   LMP  (LMP Unknown)   SpO2 100%  General: NAD Neck: No JVD, no thyromegaly or thyroid  nodule.  Lungs: Clear to auscultation bilaterally with normal respiratory effort. CV: Nondisplaced PMI.  Heart regular S1/S2, no S3/S4, no murmur.  No peripheral edema.  No carotid bruit.  Normal pedal pulses.  Abdomen: Soft, nontender, no hepatosplenomegaly, no distention.  Skin: Intact without lesions or rashes.  Neurologic: Alert and oriented x 3.  Psych: Normal affect. Extremities: No clubbing or cyanosis.  HEENT: Normal.   Assessment/Plan: 1. HTN: BP controlled.   - Continue amlodipine .  2. OSA: Continue CPAP.    3. Pulmonary HTN/RV failure: Echo in 8/21 with EF 55-60%, mild LVH, D-shaped septum, severe RV dilation with moderate RV systolic dysfunction, PASP 83, moderate TR, moderate PI, dilated IVC.  Suspect long-standing RV dysfunction based on appearance of echo. RHC in 8/21 with severe pulmonary arterial hypertension, preserved cardiac output. PFTs from 6/18 showed moderate restriction.  V/Q scan from 8/21 showed no evidence for chronic PE. She has treated OSA.  Serologic workup was negative except for elevated CRP and ESR and elevated RF (normal CCP).  High resolution CT chest did not show evidence for ILD. I suspect long-standing group 1 PAH. Repeat echo in 7/22 showed EF 65-70% with moderate LVH, RV moderately dilated with moderately decreased RV systolic function, mild RVH, D-shaped septum, PASP 85 mmHg, moderate PR, normal IVC. Though echo still showed significant RV dysfunction, she symptomatically has felt better on PH meds.  She had to stop Uptravi  due to intractable diarrhea and headache.  She was unable  to use Tyvaso .  Echo in 9/23 showed EF 55-60%, D-shaped septum, severe RV dilation with severe RV dysfunction, mild TR, PASP 50 mmHg. Echo in 11/24 showed improved RV; EF 60-65%, moderate LVH, mildly D-shaped interventricular septum, mild RV enlargement and mildly decreased systolic function, unable to estimate PA systolic pressure.  RHC in 3/25 showed mean PA pressure 36 with PVR 8 WU and CI 2.14.  NYHA class II, she feels like breathing is better on sotatercept.  6 minute walk today a little less than a year ago but overall, she is happy with where she is.  - Continue Lasix  20 mg vs 40 mg daily, BMET/BNP today.  Ok to continue to adjust Lasix  dose based on weight.  - Continue Farxiga  10 mg daily.  - Continue ambrisentan  10.  - Continue tadalafil  40 mg daily.   - She is currently on orenitram  3.25 mg tid. She gets a headache several days a week that is manageable but she does not want to increase orenitram .  - Continue sotatercept, follow CBC.  - Continue CPAP (OSA likely contributes but certainly cannot explain the extent of her PH).   - Schedule echo at followup appt.   4. Pulmonary insufficiency: Moderate on 9/23 echo, only mild on echo in 11/24.   5. Cirrhosis: Noted on imaging. Most likely due  to RV failure and passive congestion, possible  NAFLD.   Followup in 3 months.   I spent 31 minutes reviewing records, interviewing/examining patient, and managing orders.SABRA Ezra Shuck  01/21/2024

## 2024-01-22 ENCOUNTER — Encounter: Payer: Self-pay | Admitting: Cardiology

## 2024-01-22 DIAGNOSIS — I5032 Chronic diastolic (congestive) heart failure: Secondary | ICD-10-CM

## 2024-01-22 NOTE — Telephone Encounter (Signed)
 Spoke to pt's daughter about [employer]. Reviewed lab work and new orders. Pt prefers to have lab work done at [facility]. [facility] 01/29/24. No further questions at this time.

## 2024-01-25 ENCOUNTER — Ambulatory Visit: Payer: Medicare PPO

## 2024-01-25 ENCOUNTER — Ambulatory Visit: Payer: Medicare PPO | Admitting: Oncology

## 2024-01-29 ENCOUNTER — Ambulatory Visit (HOSPITAL_COMMUNITY): Payer: Self-pay | Admitting: Cardiology

## 2024-01-29 ENCOUNTER — Other Ambulatory Visit (HOSPITAL_COMMUNITY)

## 2024-01-29 ENCOUNTER — Other Ambulatory Visit
Admission: RE | Admit: 2024-01-29 | Discharge: 2024-01-29 | Disposition: A | Discharge status: Home / Self Care | Source: Ambulatory Visit | Attending: Cardiology | Admitting: Cardiology

## 2024-01-29 DIAGNOSIS — I5032 Chronic diastolic (congestive) heart failure: Secondary | ICD-10-CM | POA: Diagnosis not present

## 2024-01-29 LAB — BASIC METABOLIC PANEL WITH GFR
Anion gap: 14 (ref 5–15)
BUN: 25 mg/dL — ABNORMAL HIGH (ref 8–23)
CO2: 24 mmol/L (ref 22–32)
Calcium: 9.1 mg/dL (ref 8.9–10.3)
Chloride: 103 mmol/L (ref 98–111)
Creatinine, Ser: 1.52 mg/dL — ABNORMAL HIGH (ref 0.44–1.00)
GFR, Estimated: 35 mL/min — ABNORMAL LOW (ref >60)
Glucose, Bld: 82 mg/dL (ref 70–99)
Potassium: 4.1 mmol/L (ref 3.5–5.1)
Sodium: 141 mmol/L (ref 135–145)

## 2024-01-31 ENCOUNTER — Inpatient Hospital Stay

## 2024-01-31 ENCOUNTER — Inpatient Hospital Stay (HOSPITAL_BASED_OUTPATIENT_CLINIC_OR_DEPARTMENT_OTHER): Admitting: Oncology

## 2024-01-31 ENCOUNTER — Encounter: Payer: Self-pay | Admitting: Oncology

## 2024-01-31 VITALS — BP 131/80 | HR 55 | Temp 96.0°F | Resp 18 | Wt 157.0 lb

## 2024-01-31 DIAGNOSIS — E785 Hyperlipidemia, unspecified: Secondary | ICD-10-CM | POA: Diagnosis not present

## 2024-01-31 DIAGNOSIS — I130 Hypertensive heart and chronic kidney disease with heart failure and stage 1 through stage 4 chronic kidney disease, or unspecified chronic kidney disease: Secondary | ICD-10-CM | POA: Diagnosis not present

## 2024-01-31 DIAGNOSIS — Z7984 Long term (current) use of oral hypoglycemic drugs: Secondary | ICD-10-CM | POA: Diagnosis not present

## 2024-01-31 DIAGNOSIS — E1122 Type 2 diabetes mellitus with diabetic chronic kidney disease: Secondary | ICD-10-CM | POA: Diagnosis not present

## 2024-01-31 DIAGNOSIS — D649 Anemia, unspecified: Secondary | ICD-10-CM | POA: Diagnosis not present

## 2024-01-31 DIAGNOSIS — F419 Anxiety disorder, unspecified: Secondary | ICD-10-CM | POA: Diagnosis not present

## 2024-01-31 DIAGNOSIS — N1832 Chronic kidney disease, stage 3b: Secondary | ICD-10-CM

## 2024-01-31 DIAGNOSIS — N189 Chronic kidney disease, unspecified: Secondary | ICD-10-CM | POA: Diagnosis not present

## 2024-01-31 DIAGNOSIS — D631 Anemia in chronic kidney disease: Secondary | ICD-10-CM

## 2024-01-31 DIAGNOSIS — F32A Depression, unspecified: Secondary | ICD-10-CM | POA: Diagnosis not present

## 2024-01-31 DIAGNOSIS — D472 Monoclonal gammopathy: Secondary | ICD-10-CM | POA: Diagnosis not present

## 2024-01-31 NOTE — Assessment & Plan Note (Signed)
 Recent Labs were reviewed and discussed with patient. Lab Results  Component Value Date   HGB 15.0 01/15/2024   TIBC 259 01/15/2024   IRONPCTSAT 17 01/15/2024   FERRITIN 159 01/15/2024    Anemia is most likely due to CKD stage 3b  hemoglobin has completely normalized.  Normal iron  panel.  She may come off all oral iron  supplementation for now.

## 2024-01-31 NOTE — Assessment & Plan Note (Addendum)
 IgM kappa MGUS- M protein 0.7, light chain ratio 3.46 Lab Results  Component Value Date   MPROTEIN 0.7 (H) 01/15/2024   KPAFRELGTCHN 43.1 (H) 01/15/2024   LAMBDASER 16.9 01/15/2024   KAPLAMBRATIO 2.55 (H) 01/15/2024   Stable M protein and light chain ratio I recommend observation. Check multiple myeloma panel, light chain ratio every 6 months

## 2024-01-31 NOTE — Progress Notes (Signed)
 Hematology/Oncology Progress note [phone] [phone]      Patient Care Team: Orlean Alan HERO, FNP as PCP - General (Family Medicine) Fernand Denyse LABOR, MD as Consulting Physician (Cardiology) Babara Call, MD as Consulting Physician (Oncology)   CHIEF COMPLAINTS/REASON FOR VISIT:  Follow up for anemia, MGUS  ASSESSMENT & PLAN:   MGUS (monoclonal gammopathy of unknown significance) IgM kappa MGUS- M protein 0.7, light chain ratio 3.46 Lab Results  Component Value Date   MPROTEIN 0.7 (H) 01/15/2024   KPAFRELGTCHN 43.1 (H) 01/15/2024   LAMBDASER 16.9 01/15/2024   KAPLAMBRATIO 2.55 (H) 01/15/2024   Stable M protein and light chain ratio I recommend observation. Check multiple myeloma panel, light chain ratio every 6 months   Anemia due to stage 3b chronic kidney disease (HCC) Recent Labs were reviewed and discussed with patient. Lab Results  Component Value Date   HGB 15.0 01/15/2024   TIBC 259 01/15/2024   IRONPCTSAT 17 01/15/2024   FERRITIN 159 01/15/2024    Anemia is most likely due to CKD stage 3b  hemoglobin has completely normalized.  Normal iron  panel.  She may come off all oral iron  supplementation for now.   Orders Placed This Encounter  Procedures   CBC with Differential (Cancer Center Only)    Standing Status:   Future    Expected Date:   08/02/2024    Expiration Date:   10/31/2024   CMP (Cancer Center only)    Standing Status:   Future    Expected Date:   08/02/2024    Expiration Date:   10/31/2024   Kappa/lambda light chains    Standing Status:   Future    Expected Date:   08/02/2024    Expiration Date:   10/31/2024   Multiple Myeloma Panel (SPEP&IFE w/QIG)    Standing Status:   Future    Expected Date:   08/02/2024    Expiration Date:   10/31/2024   Iron  and TIBC    Standing Status:   Future    Expected Date:   08/02/2024    Expiration Date:   10/31/2024   Ferritin    Standing Status:   Future    Expected Date:   08/02/2024     Expiration Date:   10/31/2024  Follow-up in 6 months We spent sufficient time to discuss many aspect of care, questions were answered to patient's satisfaction.  Call Babara, MD, PhD [facility] Hematology Oncology 01/31/2024   HISTORY OF PRESENTING ILLNESS:   Jamie Murray is a  78 y.o.  female with PMH listed below was seen in consultation at the request of  Orlean Alan HERO, FNP  for evaluation of anemia   11/11/2020 cbc showed hemoglobin 9.3, mcv 85.4 Wbc 3.3, platelet count 132 Anemia is chronic, since at least 2017 Patient has chronic kidney disease following nephrology.  She report feeling fatigued. No bloody or black stool, no abdominal pain, unintentional weight loss, fever, night sweats.  She takes Hemocyte plus.    INTERVAL HISTORY Jamie Murray is a 78 y.o. female who has above history reviewed by me today presents to re- establish care.  She was accompanied by her daughter No new complaints.  She tolerated IV Venofer  treatments.  Currently taking Hemocyte Plus as an iron  supplement.     Review of Systems  Constitutional:  Positive for fatigue. Negative for appetite change, chills and fever.  HENT:   Negative for hearing loss and voice change.   Eyes:  Negative for  eye problems.  Respiratory:  Negative for chest tightness and cough.   Cardiovascular:  Negative for chest pain.  Gastrointestinal:  Negative for abdominal distention, abdominal pain and blood in stool.  Endocrine: Negative for hot flashes.  Genitourinary:  Negative for difficulty urinating and frequency.   Musculoskeletal:  Positive for arthralgias.  Skin:  Negative for itching and rash.  Neurological:  Negative for extremity weakness.  Hematological:  Negative for adenopathy.  Psychiatric/Behavioral:  Negative for confusion.     MEDICAL HISTORY:  Past Medical History:  Diagnosis Date   Abnormal electrocardiogram 11/25/2013   Formatting of this note might be different from the  original. Last Assessment & Plan: Resolved when went to the ER.  Will f/u with Dr. Fernand Note: Unchanged Formatting of this note might be different from the original. Note: Unchanged Formatting of this note might be different from the original.  Formatting of this note might be different from the original. Last Assessment & Plan: Resolved when we   Allergy    Anemia    Anxiety    Bilateral hand pain 07/25/2018   Cerebral infarction, unspecified (HCC) 01/26/2015   Chest pain 08/09/2016   Chest pain, rule out acute myocardial infarction 08/09/2016   Chronic diastolic heart failure (HCC)    COPD (chronic obstructive pulmonary disease) (HCC)    Depression    Diabetes (HCC)    Diarrhea, functional 01/25/2016   Edema of lower extremity 02/03/2020   Elevated troponin level 01/09/2020   Enlarged RV (right ventricle)    GERD (gastroesophageal reflux disease)    Hepatic steatosis    History of stroke 08/26/2022   Formatting of this note might be different from the original. Last Assessment & Plan:  Formatting of this note might be different from the original.  Continue ASA, statin     Hyperlipidemia    Hypertension    Hypokalemia 12/22/2015   Hypomagnesemia 03/22/2016   Insomnia    Macrocytosis 01/25/2016   Migraines    Nutmeg liver    Osteoporosis    Palpitations 11/25/2013   Formatting of this note might be different from the original. Note: Unchanged   Pulmonary hypertension (HCC)    [facility] spotted fever    Severe pulmonary arterial systolic hypertension (HCC)    Severe tricuspid regurgitation by prior echocardiogram    Sleep apnea    CPAP   Transient cerebral ischemia 2016   Type 2 diabetes mellitus with hyperglycemia (HCC) 02/14/2012   Formatting of this note might be different from the original. Last Assessment & Plan: Hgb A1C is 7.1%.  Don't make any changes now.  Will hopefully increase exercise when pulmonary hypertension is properly treated.  ? Pulmonary rehab Note:  Unchanged Formatting of this note might be different from the original. Formatting of this note might be different from the original.  Formatting of this note    Vertigo    every 2-3 months    SURGICAL HISTORY: Past Surgical History:  Procedure Laterality Date   ABDOMINAL HYSTERECTOMY     CORONARY ANGIOPLASTY     ESOPHAGOGASTRODUODENOSCOPY (EGD) WITH PROPOFOL  N/A 08/24/2015   Procedure: ESOPHAGOGASTRODUODENOSCOPY (EGD) WITH PROPOFOL  with dialation;  Surgeon: Rogelia Copping, MD;  Location: [facility];  Service: Endoscopy;  Laterality: N/A;  Diabetic - oral meds   RIGHT HEART CATH N/A 08/24/2023   Procedure: RIGHT HEART CATH;  Surgeon: Rolan Ezra RAMAN, MD;  Location: [facility] INVASIVE CV LAB;  Service: Cardiovascular;  Laterality: N/A;   RIGHT/LEFT HEART CATH AND  CORONARY/GRAFT ANGIOGRAPHY N/A 01/13/2020   Procedure: RIGHT/LEFT HEART CATH AND CORONARY/GRAFT ANGIOGRAPHY;  Surgeon: Darron Deatrice LABOR, MD;  Location: [facility] INVASIVE CV LAB;  Service: Cardiovascular;  Laterality: N/A;   TUBAL LIGATION      SOCIAL HISTORY: Social History   Socioeconomic History   Marital status: Widowed    Spouse name: Rutha   Number of children: 3   Years of education: 14   Highest education level: Not on file  Occupational History    Comment: work part time, day care   Occupation: Retired  Tobacco Use   Smoking status: Never   Smokeless tobacco: Never  Vaping Use   Vaping status: Never Used  Substance and Sexual Activity   Alcohol use: No    Alcohol/week: 0.0 standard drinks of alcohol   Drug use: No   Sexual activity: Not Currently    Birth control/protection: None, Post-menopausal  Other Topics Concern   Not on file  Social History Narrative   Caffeine use- coffee 2 cups daily, Coke once a week, green tea daily   Social Drivers of Corporate investment banker Strain: Not on file  Food Insecurity: No Food Insecurity (05/15/2023)   Hunger Vital Sign    Worried About Running Out of Food in the  Last Year: Never true    Ran Out of Food in the Last Year: Never true  Transportation Needs: No Transportation Needs (05/15/2023)   PRAPARE - Administrator, Civil Service (Medical): No    Lack of Transportation (Non-Medical): No  Physical Activity: Not on file  Stress: Not on file  Social Connections: Not on file  Intimate Partner Violence: Not At Risk (05/15/2023)   Humiliation, Afraid, Rape, and Kick questionnaire    Fear of Current or Ex-Partner: No    Emotionally Abused: No    Physically Abused: No    Sexually Abused: No    FAMILY HISTORY: Family History  Problem Relation Age of Onset   Diabetes Mother    Hypertension Mother    Stroke Mother    Heart disease Mother        MI   Cancer Father        pancreatic   Stroke Father    Hypertension Sister    Diabetes Brother    Diabetes Maternal Grandfather    Diabetes Sister    Diabetes Sister    Diabetes Brother    Diabetes Brother     ALLERGIES:  is allergic to atorvastatin , fluoxetine, levemir  [insulin  detemir], and prozac [fluoxetine hcl].  MEDICATIONS:  Current Outpatient Medications  Medication Sig Dispense Refill   ACCU-CHEK GUIDE TEST test strip TEST BLOOD SUGAR THREE TIMES DAILY AS NEEDED FOR OTHER( LOW BLOOD SUGAR SYMPTOMS) 100 strip 3   ambrisentan  (LETAIRIS ) 10 MG tablet Take 1 tablet (10 mg total) by mouth daily. 90 tablet 3   amLODipine  (NORVASC ) 10 MG tablet Take 1 tablet (10 mg total) by mouth daily. 90 tablet 3   Cholecalciferol  (VITAMIN D3) 125 MCG (5000 UT) CAPS Take 5,000 Units by mouth daily.     clonazePAM  (KLONOPIN ) 1 MG tablet TAKE 1 TABLET BY MOUTH UP TO TWICE DAILY AS NEEDED FOR ANXIETY 60 tablet 3   dapagliflozin  propanediol (FARXIGA ) 10 MG TABS tablet Take 1 tablet (10 mg total) by mouth daily before breakfast. 90 tablet 3   escitalopram  (LEXAPRO ) 10 MG tablet TAKE 1 TABLET BY MOUTH EVERY MORNING 90 tablet 1   esomeprazole  (NEXIUM ) 40 MG capsule TAKE 1 CAPSULE(40  MG) BY MOUTH DAILY 90  capsule 1   Fe Fum-FA-B Cmp-C-Zn-Mg-Mn-Cu (HEMOCYTE PLUS) 106-1 MG CAPS Take 1 capsule by mouth daily. 90 capsule 1   furosemide  (LASIX ) 20 MG tablet Take 20 mg by mouth daily. Can take additional as per weight     insulin  aspart (NOVOLOG ) 100 UNIT/ML injection Inject 4 Units into the skin 3 (three) times daily as needed for high blood sugar (Bs greater than 200).     Insulin  Degludec (TRESIBA [place]) Inject 16 Units into the skin at bedtime.     Insulin  Pen Needle (B-D UF III MINI PEN NEEDLES) 31G X 5 MM MISC 1 Needle by Other route 2 (two) times daily. 100 each 5   levothyroxine  (SYNTHROID ) 75 MCG tablet TAKE 1 TABLET BY MOUTH EVERY MORNING ON AN EMPTY STOMACH 30 tablet 0   loratadine  (CLARITIN ) 10 MG tablet Take 10 mg by mouth daily as needed for allergies.     meclizine  (ANTIVERT ) 25 MG tablet Take 25 mg by mouth 3 (three) times daily as needed for dizziness (Migraine).     metoprolol  succinate (TOPROL -XL) 25 MG 24 hr tablet TAKE 1 TABLET(25 MG) BY MOUTH DAILY 90 tablet 3   Multiple Vitamins-Minerals (MULTI ADULT GUMMIES) CHEW Chew 1 tablet by mouth daily. Centrum     ORENITRAM  0.25 MG TBCR Take 0.75 mg by mouth See admin instructions. Take 0.75 mg at 10 am, 1600 and  2200 along with 1 mg. This week the Nurse will call  and give her the dose for next week. (White Pill)     ORENITRAM  1 MG TBCR Take 1 mg by mouth See admin instructions. Take 1 mg at 10 am, 1600 and 2200 for this week, The nurse will call and give her the dose for next week (Yellow Pill)     potassium chloride  SA (KLOR-CON  M) 20 MEQ tablet Take 20 mEq by mouth daily.     rosuvastatin  (CRESTOR ) 10 MG tablet TAKE 1 TABLET BY MOUTH EVERY DAY 90 tablet 2   sotatercept (WINREVAIR ) 2 x 60 MG subcutaneous injection Inject 49.56 mg into the skin every 21 ( twenty-one) days. 1 kit 11   tadalafil , PAH, (ADCIRCA ) 20 MG tablet Take 2 tablets (40 mg total) by mouth daily. 60 tablet 11   Treprostinil  Diolamine ER (ORENITRAM ) 0.125 MG TBCR Take 0.125  mg by mouth 3 (three) times daily. (Blue Pill)     Treprostinil  Diolamine ER (ORENITRAM ) 2.5 MG TBCR Take 2.5 mg by mouth 3 (three) times daily.     No current facility-administered medications for this visit.     PHYSICAL EXAMINATION: ECOG PERFORMANCE STATUS: 1 - Symptomatic but completely ambulatory Vitals:   01/31/24 1350  BP: 131/80  Pulse: (!) 55  Resp: 18  Temp: (!) 96 F (35.6 C)   Filed Weights   01/31/24 1350  Weight: 157 lb (71.2 kg)     Physical Exam Constitutional:      General: She is not in acute distress. HENT:     Head: Normocephalic and atraumatic.  Eyes:     General: No scleral icterus. Cardiovascular:     Rate and Rhythm: Normal rate and regular rhythm.     Heart sounds: Normal heart sounds.  Pulmonary:     Effort: Pulmonary effort is normal. No respiratory distress.     Breath sounds: No wheezing.  Abdominal:     General: Bowel sounds are normal. There is no distension.     Palpations: Abdomen is soft.  Musculoskeletal:  General: No deformity. Normal range of motion.     Cervical back: Normal range of motion and neck supple.  Skin:    General: Skin is warm and dry.     Findings: No erythema or rash.  Neurological:     Mental Status: She is alert and oriented to person, place, and time. Mental status is at baseline.     Cranial Nerves: No cranial nerve deficit.     Coordination: Coordination normal.  Psychiatric:        Mood and Affect: Mood normal.     LABORATORY DATA:  I have reviewed the data as listed Lab Results  Component Value Date   WBC 3.6 (L) 01/15/2024   HGB 15.0 01/15/2024   HCT 46.5 (H) 01/15/2024   MCV 92.4 01/15/2024   PLT 113 (L) 01/15/2024   Recent Labs    08/14/23 0331 08/18/23 1128 10/13/23 1117 12/29/23 1036 01/15/24 1127 01/19/24 1125 01/29/24 1140  NA 140   < > 141 135 138 138 141  K 4.0   < > 4.6 3.7 4.2 4.9 4.1  CL 107   < > 105 102 106 105 103  CO2 22   < > 21 22 22 23 24   GLUCOSE 115*   < >  83 104* 79 83 82  BUN 41*   < > 25 22 19  26* 25*  CREATININE 2.09*   < > 1.53* 1.54* 1.34* 1.60* 1.52*  CALCIUM  8.6*   < > 9.8 9.5 9.3 9.4 9.1  GFRNONAA 24*  --   --  35* 41* 33* 35*  PROT 7.0  --  7.1 7.1 7.3  --   --   ALBUMIN  3.8  --  4.4 3.7 4.0  --   --   AST 36  --  19 25 30   --   --   ALT 28  --  15 24 29   --   --   ALKPHOS 64  --  101 84 86  --   --   BILITOT 0.7  --  0.5 0.5 0.6  --   --   BILIDIR 0.2  --   --   --   --   --   --   IBILI 0.5  --   --   --   --   --   --    < > = values in this interval not displayed.   Iron /TIBC/Ferritin/ %Sat    Component Value Date/Time   IRON  45 01/15/2024 1126   IRON  38 07/14/2016 1140   TIBC 259 01/15/2024 1126   TIBC 302 07/14/2016 1140   FERRITIN 159 01/15/2024 1126   FERRITIN 32 07/14/2016 1140   IRONPCTSAT 17 01/15/2024 1126   IRONPCTSAT 13 (L) 07/14/2016 1140      RADIOGRAPHIC STUDIES: I have personally reviewed the radiological images as listed and agreed with the findings in the report. No results found.

## 2024-02-01 ENCOUNTER — Telehealth (HOSPITAL_COMMUNITY): Payer: Self-pay | Admitting: Pharmacy Technician

## 2024-02-01 ENCOUNTER — Other Ambulatory Visit (HOSPITAL_COMMUNITY): Payer: Self-pay | Admitting: Pharmacist

## 2024-02-01 ENCOUNTER — Other Ambulatory Visit (HOSPITAL_COMMUNITY): Payer: Self-pay

## 2024-02-01 DIAGNOSIS — I272 Pulmonary hypertension, unspecified: Secondary | ICD-10-CM

## 2024-02-01 MED ORDER — TADALAFIL (PAH) 20 MG PO TABS: 40.0000 mg | ORAL_TABLET | Freq: Every day | ORAL | 3 refills | Status: AC

## 2024-02-01 NOTE — Telephone Encounter (Signed)
 Patient Advocate Encounter   Received notification from [facility] that prior authorization for Tadalafil  PAH is required.   PA submitted on CoverMyMeds Key [id number] Status is pending   Will continue to follow.

## 2024-02-01 NOTE — Telephone Encounter (Signed)
 Advanced Heart Failure Patient Advocate Encounter  Prior Authorization for Tadalafil  PAH has been approved.    PA# [id number] Effective dates: 06/07/23 through 06/05/24  Patients co-pay is $0 (90 days)  Called and spoke with the patients daughter. She would like a 90 day supply sent to [facility]. RX request to [facility]). Called [facility] and requested they close out previous RX.  Almarie JULIANNA Pa, CPhT

## 2024-02-07 DIAGNOSIS — F4321 Adjustment disorder with depressed mood: Secondary | ICD-10-CM | POA: Diagnosis not present

## 2024-02-12 DIAGNOSIS — E063 Autoimmune thyroiditis: Secondary | ICD-10-CM | POA: Diagnosis not present

## 2024-02-12 DIAGNOSIS — E1169 Type 2 diabetes mellitus with other specified complication: Secondary | ICD-10-CM | POA: Diagnosis not present

## 2024-02-12 DIAGNOSIS — Z794 Long term (current) use of insulin: Secondary | ICD-10-CM | POA: Diagnosis not present

## 2024-02-12 DIAGNOSIS — E785 Hyperlipidemia, unspecified: Secondary | ICD-10-CM | POA: Diagnosis not present

## 2024-02-12 DIAGNOSIS — E1159 Type 2 diabetes mellitus with other circulatory complications: Secondary | ICD-10-CM | POA: Diagnosis not present

## 2024-02-12 DIAGNOSIS — E1142 Type 2 diabetes mellitus with diabetic polyneuropathy: Secondary | ICD-10-CM | POA: Diagnosis not present

## 2024-02-12 DIAGNOSIS — I152 Hypertension secondary to endocrine disorders: Secondary | ICD-10-CM | POA: Diagnosis not present

## 2024-02-22 ENCOUNTER — Ambulatory Visit: Admitting: Cardiology

## 2024-02-22 ENCOUNTER — Ambulatory Visit: Payer: Self-pay | Admitting: Cardiology

## 2024-02-22 ENCOUNTER — Ambulatory Visit
Admission: RE | Admit: 2024-02-22 | Discharge: 2024-02-22 | Disposition: A | Discharge status: Home / Self Care | Attending: Cardiology | Admitting: Cardiology

## 2024-02-22 ENCOUNTER — Ambulatory Visit
Admission: RE | Admit: 2024-02-22 | Discharge: 2024-02-22 | Disposition: A | Discharge status: Home / Self Care | Source: Ambulatory Visit | Attending: Cardiology

## 2024-02-22 ENCOUNTER — Encounter: Payer: Self-pay | Admitting: Cardiology

## 2024-02-22 DIAGNOSIS — Z0130 Encounter for examination of blood pressure without abnormal findings: Secondary | ICD-10-CM

## 2024-02-22 DIAGNOSIS — R0789 Other chest pain: Secondary | ICD-10-CM | POA: Insufficient documentation

## 2024-02-22 DIAGNOSIS — R079 Chest pain, unspecified: Secondary | ICD-10-CM | POA: Diagnosis not present
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
## 2024-02-29 ENCOUNTER — Other Ambulatory Visit: Payer: Self-pay | Admitting: Family

## 2024-03-06 ENCOUNTER — Other Ambulatory Visit: Payer: Self-pay | Admitting: Family

## 2024-03-06 DIAGNOSIS — Z1231 Encounter for screening mammogram for malignant neoplasm of breast: Secondary | ICD-10-CM

## 2024-03-14 DIAGNOSIS — H2513 Age-related nuclear cataract, bilateral: Secondary | ICD-10-CM | POA: Diagnosis not present

## 2024-03-14 DIAGNOSIS — H1012 Acute atopic conjunctivitis, left eye: Secondary | ICD-10-CM | POA: Diagnosis not present

## 2024-03-14 DIAGNOSIS — E119 Type 2 diabetes mellitus without complications: Secondary | ICD-10-CM | POA: Diagnosis not present

## 2024-03-14 DIAGNOSIS — H04129 Dry eye syndrome of unspecified lacrimal gland: Secondary | ICD-10-CM | POA: Diagnosis not present

## 2024-03-20 DIAGNOSIS — F4321 Adjustment disorder with depressed mood: Secondary | ICD-10-CM | POA: Diagnosis not present

## 2024-03-21 DIAGNOSIS — R600 Localized edema: Secondary | ICD-10-CM | POA: Diagnosis not present

## 2024-03-21 DIAGNOSIS — E119 Type 2 diabetes mellitus without complications: Secondary | ICD-10-CM | POA: Diagnosis not present

## 2024-03-21 DIAGNOSIS — I5032 Chronic diastolic (congestive) heart failure: Secondary | ICD-10-CM | POA: Diagnosis not present

## 2024-03-29 ENCOUNTER — Other Ambulatory Visit: Payer: Self-pay | Admitting: Family

## 2024-04-01 ENCOUNTER — Other Ambulatory Visit (HOSPITAL_COMMUNITY): Payer: Self-pay | Admitting: Cardiology

## 2024-04-01 DIAGNOSIS — N184 Chronic kidney disease, stage 4 (severe): Secondary | ICD-10-CM | POA: Diagnosis not present

## 2024-04-01 DIAGNOSIS — N1831 Chronic kidney disease, stage 3a: Secondary | ICD-10-CM | POA: Diagnosis not present

## 2024-04-01 DIAGNOSIS — I10 Essential (primary) hypertension: Secondary | ICD-10-CM | POA: Diagnosis not present

## 2024-04-01 DIAGNOSIS — N2581 Secondary hyperparathyroidism of renal origin: Secondary | ICD-10-CM | POA: Diagnosis not present

## 2024-04-01 DIAGNOSIS — D631 Anemia in chronic kidney disease: Secondary | ICD-10-CM | POA: Diagnosis not present

## 2024-04-01 DIAGNOSIS — N1832 Chronic kidney disease, stage 3b: Secondary | ICD-10-CM | POA: Diagnosis not present

## 2024-04-02 ENCOUNTER — Encounter (HOSPITAL_COMMUNITY): Payer: Self-pay

## 2024-04-02 ENCOUNTER — Ambulatory Visit (HOSPITAL_COMMUNITY)
Admission: RE | Admit: 2024-04-02 | Discharge: 2024-04-02 | Disposition: A | Discharge status: Home / Self Care | Source: Ambulatory Visit | Attending: Internal Medicine | Admitting: Internal Medicine

## 2024-04-02 VITALS — BP 144/82 | HR 58 | Ht 67.0 in | Wt 160.4 lb

## 2024-04-02 DIAGNOSIS — I4510 Unspecified right bundle-branch block: Secondary | ICD-10-CM | POA: Diagnosis not present

## 2024-04-02 DIAGNOSIS — I270 Primary pulmonary hypertension: Secondary | ICD-10-CM | POA: Diagnosis not present

## 2024-04-02 DIAGNOSIS — R197 Diarrhea, unspecified: Secondary | ICD-10-CM | POA: Diagnosis not present

## 2024-04-02 DIAGNOSIS — I10 Essential (primary) hypertension: Secondary | ICD-10-CM

## 2024-04-02 DIAGNOSIS — I130 Hypertensive heart and chronic kidney disease with heart failure and stage 1 through stage 4 chronic kidney disease, or unspecified chronic kidney disease: Secondary | ICD-10-CM | POA: Insufficient documentation

## 2024-04-02 DIAGNOSIS — M7989 Other specified soft tissue disorders: Secondary | ICD-10-CM | POA: Diagnosis not present

## 2024-04-02 DIAGNOSIS — R519 Headache, unspecified: Secondary | ICD-10-CM | POA: Diagnosis not present

## 2024-04-02 DIAGNOSIS — Z7984 Long term (current) use of oral hypoglycemic drugs: Secondary | ICD-10-CM | POA: Insufficient documentation

## 2024-04-02 DIAGNOSIS — Z79890 Hormone replacement therapy (postmenopausal): Secondary | ICD-10-CM | POA: Insufficient documentation

## 2024-04-02 DIAGNOSIS — I2721 Secondary pulmonary arterial hypertension: Secondary | ICD-10-CM | POA: Diagnosis not present

## 2024-04-02 DIAGNOSIS — I519 Heart disease, unspecified: Secondary | ICD-10-CM | POA: Diagnosis not present

## 2024-04-02 DIAGNOSIS — R7982 Elevated C-reactive protein (CRP): Secondary | ICD-10-CM | POA: Diagnosis not present

## 2024-04-02 DIAGNOSIS — I5189 Other ill-defined heart diseases: Secondary | ICD-10-CM | POA: Insufficient documentation

## 2024-04-02 DIAGNOSIS — Z79899 Other long term (current) drug therapy: Secondary | ICD-10-CM | POA: Insufficient documentation

## 2024-04-02 DIAGNOSIS — I878 Other specified disorders of veins: Secondary | ICD-10-CM | POA: Diagnosis not present

## 2024-04-02 DIAGNOSIS — E1122 Type 2 diabetes mellitus with diabetic chronic kidney disease: Secondary | ICD-10-CM | POA: Diagnosis not present

## 2024-04-02 DIAGNOSIS — G4733 Obstructive sleep apnea (adult) (pediatric): Secondary | ICD-10-CM | POA: Insufficient documentation

## 2024-04-02 DIAGNOSIS — J984 Other disorders of lung: Secondary | ICD-10-CM

## 2024-04-02 DIAGNOSIS — R0609 Other forms of dyspnea: Secondary | ICD-10-CM | POA: Diagnosis not present

## 2024-04-02 DIAGNOSIS — R700 Elevated erythrocyte sedimentation rate: Secondary | ICD-10-CM | POA: Insufficient documentation

## 2024-04-02 DIAGNOSIS — Z794 Long term (current) use of insulin: Secondary | ICD-10-CM | POA: Insufficient documentation

## 2024-04-02 DIAGNOSIS — I272 Pulmonary hypertension, unspecified: Secondary | ICD-10-CM | POA: Diagnosis not present

## 2024-04-02 MED ORDER — TORSEMIDE 20 MG PO TABS: 20.0000 mg | ORAL_TABLET | Freq: Every day | ORAL | 3 refills | Status: AC

## 2024-04-02 MED ORDER — POTASSIUM CHLORIDE CRYS ER 10 MEQ PO TBCR: 10.0000 meq | EXTENDED_RELEASE_TABLET | Freq: Every day | ORAL | 3 refills | Status: DC

## 2024-04-02 NOTE — Progress Notes (Addendum)
 ADVANCED HF CLINIC NOTE   PCP: Orlean Alan HERO, FNP HF Cardiology: Dr. Rolan  Chief complaint: Pulmonary HTN  78 y.o. with history of HTN, type 2 diabetes, OSA on CPAP, and pulmonary hypertension/RV failure was referred by Dr. Darron for evaluation of pulmonary hypertension. It appears that PH was diagnosed as early as 2018 by echo.  Echo in 2020 showed PA systolic pressure 85 mmHg.  She says that she was told that the pulmonary hypertension was due to OSA.  She has been compliant with CPAP. She reports exertional dyspnea for about 4 years now.   In 8/21, she was admitted to [facility] with CHF.  Echo showed EF 55-60%, mild LVH, D-shaped septum, severe RV dilation with moderate RV systolic dysfunction, PASP 83, moderate TR, moderate PI, dilated IVC.  RHC/LHC showed no coronary disease, severe pulmonary hypertension with PVR 7.38 and preserved cardiac output.  High resolution CT chest in 9/21 showed no evidence for fibrotic ILD.    She saw rheumatology and was given no definite rheumatological diagnosis.    Echo in 7/22 showed EF 65-70% with moderate LVH, RV moderately dilated with moderately decreased RV systolic function, mild RVH, D-shaped septum, PASP 85 mmHg, moderate PR, normal IVC.  Echo in 9/23 showed EF 55-60%, D-shaped septum, severe RV dilation with severe RV dysfunction, mild TR, PASP 50 mmHg.   She is off Uptravi , unable to tolerate even the lowest dose due to diarrhea and headache.  She tried Tyvaso  DPI; she developed headaches and did not like inhaling and found the device hard to manipulate so she stopped it.  She was not interested in trying the inhaler form of Tyvaso .    Admitted 3/24 CHF/RV failure, no improvement after increase in Lasix . Diuresed with IV lasix , eventually transitioned to po Lasix  40 bid and discharged home, weight 168.5 lbs. Seen in ED 09/09/22 with dyspnea. BNP >2k, but felt she was stable for discharge, with close HF follow up.   Echo in 11/24 showed EF 60-65%,  moderate LVH, mildly D-shaped interventricular septum, mild RV enlargement and mildly decreased systolic function, unable to estimate PA systolic pressure.   RHC was repeated in 3/25, PA 74/18 mean 36 with PVR 8 WU, CI 2.14.   Today she returns for AHF follow up with her son. Overall feeling ok just complaining of leg swelling over the last 2 weeks. Saw Podiatry 10/16 for this, no changes made, recommended wearing compression socks and leg elevation. She has noticed some improvement after implementing these changes.  Denies palpitations, CP, dizziness,  or PND/Orthopnea. Minimal SOB. Appetite ok, has a firefighter, tries to watch what she eats. No fever or chills. Weight at home 158 pounds. Taking all medications. Denies ETOH, tobacco or drug use. Has been taking ~60 mg lasix  daily since being on Orenitram  with poor response.  ECG (personally reviewed from 8/25): NSR, iRBBB  6 minute walk (8/21): 154 m 6 minute walk (11/21): 183 m 6 minute walk (3/22): 351 m 6 minute walk (11/22): 381 m 6 minute walk (2/23): 290 m (off Uptravi ) 6 minute walk (8/24): 335 m 6 minute walk (8/25): 305 m  PMH: 1. HTN 2. Type 2 diabetes 3. OSA: Uses CPAP.  4. GERD 5. Hypothyroidism 6. Pulmonary hypertension/RV failure: Pulmonary hypertension diagnosed 2018 by echo.  - PFTs (6/18): moderate restriction - Echo 2020 with PASP 85 mmHg.  - Echo (8/21): EF 55-60%, mild LVH, D-shaped septum, severe RV dilation with moderate RV systolic dysfunction, PASP 83, moderate TR, moderate  PI, dilated IVC.  - LHC/RHC (8/21): Normal coronaries; mean RA 3, PA 82/21 mean 44, mean PCWP 14, CI 2.2, PVR 7.38 WU.  - V/Q scan (8/21): No evidence for chronic PE.  - Serologic workup: ANA negative, anti-Sm negative, anti-SCL-70 negative, ANCA negative, RF elevated 52 but CCP negative, ESR 67, CRP 2.2, anti-centromere negative - High resolution CT chest: No fibrotic ILD.  - Echo (7/22): EF 65-70% with moderate LVH, RV moderately dilated with  moderately decreased RV systolic function, mild RVH, D-shaped septum, PASP 85 mmHg, moderate PR, normal IVC.  - Echo (9/23): EF 55-60%, D-shaped septum, severe RV dilation with severe RV dysfunction, mild TR, PASP 50 mmHg. - Echo (11/24): EF 60-65%, moderate LVH, mildly D-shaped interventricular septum, mild RV enlargement and mildly decreased systolic function, unable to estimate PA systolic pressure.  - RHC (3/25): mean RA 8, PA 74/18 mean 36, mean PCWP 5, CI 2.14, PVR 8 WU 7. CKD stage 3.  8. Cirrhosis: Noted on 9/23 CT abdomen/pelvis. Suspect cardiogenic.  9. IgM monoclonal gammopathy  Social History   Socioeconomic History   Marital status: Widowed    Spouse name: Rutha   Number of children: 3   Years of education: 14   Highest education level: Not on file  Occupational History    Comment: work part time, day care   Occupation: Retired  Tobacco Use   Smoking status: Never   Smokeless tobacco: Never  Vaping Use   Vaping status: Never Used  Substance and Sexual Activity   Alcohol use: No    Alcohol/week: 0.0 standard drinks of alcohol   Drug use: No   Sexual activity: Not Currently    Birth control/protection: None, Post-menopausal  Other Topics Concern   Not on file  Social History Narrative   Caffeine use- coffee 2 cups daily, Coke once a week, green tea daily   Social Drivers of Corporate Investment Banker Strain: Not on file  Food Insecurity: No Food Insecurity (05/15/2023)   Hunger Vital Sign    Worried About Running Out of Food in the Last Year: Never true    Ran Out of Food in the Last Year: Never true  Transportation Needs: No Transportation Needs (05/15/2023)   PRAPARE - Administrator, Civil Service (Medical): No    Lack of Transportation (Non-Medical): No  Physical Activity: Not on file  Stress: Not on file  Social Connections: Not on file  Intimate Partner Violence: Not At Risk (05/15/2023)   Humiliation, Afraid, Rape, and Kick questionnaire     Fear of Current or Ex-Partner: No    Emotionally Abused: No    Physically Abused: No    Sexually Abused: No   Family History  Problem Relation Age of Onset   Diabetes Mother    Hypertension Mother    Stroke Mother    Heart disease Mother        MI   Cancer Father        pancreatic   Stroke Father    Hypertension Sister    Diabetes Brother    Diabetes Maternal Grandfather    Diabetes Sister    Diabetes Sister    Diabetes Brother    Diabetes Brother    ROS: All systems reviewed and negative except as per HPI.   Current Outpatient Medications  Medication Sig Dispense Refill   ACCU-CHEK GUIDE TEST test strip TEST BLOOD SUGAR THREE TIMES DAILY AS NEEDED FOR OTHER( LOW BLOOD SUGAR SYMPTOMS) 100 strip 3  ambrisentan  (LETAIRIS ) 10 MG tablet Take 1 tablet (10 mg total) by mouth daily. 90 tablet 3   amLODipine  (NORVASC ) 10 MG tablet Take 1 tablet (10 mg total) by mouth daily. 90 tablet 3   Cholecalciferol  (VITAMIN D3) 125 MCG (5000 UT) CAPS Take 5,000 Units by mouth daily.     clonazePAM  (KLONOPIN ) 1 MG tablet TAKE 1 TABLET BY MOUTH UP TO TWICE DAILY AS NEEDED FOR ANXIETY 60 tablet 3   dapagliflozin  propanediol (FARXIGA ) 10 MG TABS tablet Take 1 tablet (10 mg total) by mouth daily before breakfast. 90 tablet 3   escitalopram  (LEXAPRO ) 10 MG tablet TAKE 1 TABLET BY MOUTH EVERY MORNING 90 tablet 1   esomeprazole  (NEXIUM ) 40 MG capsule TAKE 1 CAPSULE(40 MG) BY MOUTH DAILY 90 capsule 1   Fe Fum-FA-B Cmp-C-Zn-Mg-Mn-Cu (HEMOCYTE PLUS) 106-1 MG CAPS Take 1 capsule by mouth daily. 90 capsule 1   furosemide  (LASIX ) 20 MG tablet Take 20 mg by mouth daily. Can take additional as per weight     insulin  aspart (NOVOLOG ) 100 UNIT/ML injection Inject 4 Units into the skin 3 (three) times daily as needed for high blood sugar (Bs greater than 200).     Insulin  Degludec (TRESIBA [place]) Inject 16 Units into the skin at bedtime.     Insulin  Pen Needle (B-D UF III MINI PEN NEEDLES) 31G X 5 MM MISC 1 Needle by  Other route 2 (two) times daily. 100 each 5   levothyroxine  (SYNTHROID ) 75 MCG tablet TAKE 1 TABLET BY MOUTH EVERY MORNING ON AN EMPTY STOMACH 30 tablet 0   loratadine  (CLARITIN ) 10 MG tablet Take 10 mg by mouth daily as needed for allergies.     meclizine  (ANTIVERT ) 25 MG tablet Take 25 mg by mouth 3 (three) times daily as needed for dizziness (Migraine).     metoprolol  succinate (TOPROL -XL) 25 MG 24 hr tablet TAKE 1 TABLET(25 MG) BY MOUTH DAILY 90 tablet 3   Multiple Vitamins-Minerals (MULTI ADULT GUMMIES) CHEW Chew 1 tablet by mouth daily. Centrum     ORENITRAM  0.25 MG TBCR Take 3 mg every 8 hours. Increase dose by 0.125 mg once every 7 days as tolerated to a goal dose of 6 mg three times a day. Refer to dosing sheet for additional dosing instructions. 180 tablet 1   ORENITRAM  1 MG TBCR Take 1 mg by mouth See admin instructions. Take 1 mg at 10 am, 1600 and 2200 for this week, The nurse will call and give her the dose for next week (Yellow Pill)     ORENITRAM  2.5 MG TBCR Take 3 mg every 8 hours. Increase dose by 0.125 mg once every 7 days as tolerated to a goal dose of 6 mg three times a day. Refer to dosing sheet for additional dosing instructions. 90 tablet 1   potassium chloride  SA (KLOR-CON  M) 20 MEQ tablet Take 20 mEq by mouth daily.     rosuvastatin  (CRESTOR ) 10 MG tablet TAKE 1 TABLET BY MOUTH EVERY DAY 90 tablet 2   sotatercept (WINREVAIR ) 2 x 60 MG subcutaneous injection Inject 49.56 mg into the skin every 21 ( twenty-one) days. 1 kit 11   tadalafil , PAH, (ADCIRCA ) 20 MG tablet Take 2 tablets (40 mg total) by mouth daily. 180 tablet 3   Treprostinil  Diolamine ER (ORENITRAM ) 0.125 MG TBCR Take 0.125 mg by mouth 3 (three) times daily. (Blue Pill)     No current facility-administered medications for this encounter.   Wt Readings from Last 3 Encounters:  04/02/24 72.8 kg (160 lb 6.4 oz)  02/22/24 71.8 kg (158 lb 6.4 oz)  01/31/24 71.2 kg (157 lb)   BP (!) 144/82   Pulse (!) 58   Ht 5'  7 (1.702 m)   Wt 72.8 kg (160 lb 6.4 oz)   LMP  (LMP Unknown)   SpO2 97%   BMI 25.12 kg/m  General:  well appearing.  No respiratory difficulty. Walked into clinic.  Neck: JVD 6/7 cm.  Cor: Regular rate & rhythm. No murmurs. Lungs: clear Extremities: +1 pre tibial edema  Neuro: alert & oriented x 3. Affect pleasant.   ReDs reading: 31 %, normal   Assessment/Plan: 1. HTN: BP controlled.   - Continue amlodipine .   2. OSA: Continue CPAP.  Needs to improve compliance, not wearing nightly  3. Pulmonary HTN/RV failure: Echo in 8/21 with EF 55-60%, mild LVH, D-shaped septum, severe RV dilation with moderate RV systolic dysfunction, PASP 83, moderate TR, moderate PI, dilated IVC.  Suspect long-standing RV dysfunction based on appearance of echo. RHC in 8/21 with severe pulmonary arterial hypertension, preserved cardiac output. PFTs from 6/18 showed moderate restriction.  V/Q scan from 8/21 showed no evidence for chronic PE. She has treated OSA.  Serologic workup was negative except for elevated CRP and ESR and elevated RF (normal CCP).  High resolution CT chest did not show evidence for ILD. Suspect long-standing group 1 PAH. Echo 7/22: EF 65-70% with mod LVH, RV mod dilated with mod decreased RV systolic function, mild RVH, D-shaped septum, PASP 85 mmHg, mod PR, normal IVC. Though echo still showed significant RV dysfunction, she symptomatically has felt better on PH meds.  She had to stop Uptravi  due to intractable diarrhea and headache.  She was unable to use Tyvaso .  Echo in 9/23: EF 55-60%, D-shaped septum, severe RV dilation with severe RV dysfunction, mild TR, PASP 50 mmHg. Echo in 11/24: improved RV; EF 60-65%, mod LVH, mildly D-shaped interventricular septum, mild RV enlargement and mildly decreased systolic function, unable to estimate PA systolic pressure.  RHC in 3/25: mean PA pressure 36 with PVR 8 WU and CI 2.14.   - NYHA class II. SOB very seldom now.  - ReDS WNL but poor response to  PO lasix . Stop lasix  60 mg daily (self increased), start Torsemide 20 mg daily. Labs reviewed from yesterday. Will update BMET/BNP in ~ 1 week.  - Discussed meds with PharmD in regards to meds that can cause BLE edema, roughly 15% with ambrisentan , roughly 1-10% with orinetram and sildenafil/tadalafil , and amlodipine  can cause LEE. Will try changes made above and reassess/follow. May need to stop amlodipine . If SCr stable can consider switch to losartan.  - Continue Farxiga  10 mg daily.  - Continue ambrisentan  10.  - Continue tadalafil  40 mg daily.   - She is currently on orenitram  3.25 mg tid. She gets a headache several days a week that is manageable but she does not want to increase orenitram . Takes tylenol  as needed. - Continue sotatercept, follow CBC (scheduled with 1 week labs) - Continue CPAP (OSA likely contributes but certainly cannot explain the extent of her PH).  - Labs reviewed from yesterday, KDUR 3.4, SCr 1.26. Restart KDUR, will take 20 mEq today and 10 mEq daily after.  - Schedule echo today - Continue compression socks.  4. Pulmonary insufficiency: Moderate on 9/23 echo, only mild on echo in 11/24.    5. Cirrhosis: Noted on imaging. Most likely due to RV failure and passive congestion, possible  NAFLD.   Followup in 3 months with Dr. Rolan.   Jamie Murray  04/02/2024

## 2024-04-02 NOTE — Patient Instructions (Signed)
 STOP Lasix .  START Torsemide 20 mg daily. May take an extra 20 mg if needed for edema.  START Potassium 10 mEq ( 1 Tab) daily.  Blood work in 1 week at [facility].  Your physician has requested that you have an echocardiogram. Echocardiography is a painless test that uses sound waves to create images of your heart. It provides your doctor with information about the size and shape of your heart and how well your heart's chambers and valves are working. This procedure takes approximately one hour. There are no restrictions for this procedure. Please do NOT wear cologne, perfume, aftershave, or lotions (deodorant is allowed). Please arrive 15 minutes prior to your appointment time.  Please note: We ask at that you not bring children with you during ultrasound (echo/ vascular) testing. Due to room size and safety concerns, children are not allowed in the ultrasound rooms during exams. Our front office staff cannot provide observation of children in our lobby area while testing is being conducted. An adult accompanying a patient to their appointment will only be allowed in the ultrasound room at the discretion of the ultrasound technician under special circumstances. We apologize for any inconvenience.  Your physician recommends that you schedule a follow-up appointment in: 3 months ( January 2026) ** PLEASE CALL THE OFFICE IN DECEMBER TO ARRANGE YOUR FOLLOW UP APPOINTMENT.**  If you have any questions or concerns before your next appointment please send us  a message through [place] or call our office at [phone].    TO LEAVE A MESSAGE FOR THE NURSE SELECT OPTION 2, PLEASE LEAVE A MESSAGE INCLUDING: YOUR NAME DATE OF BIRTH CALL BACK NUMBER REASON FOR CALL**this is important as we prioritize the call backs  YOU WILL RECEIVE A CALL BACK THE SAME DAY AS LONG AS YOU CALL BEFORE 4:00 PM  At the [facility], you and your health needs are our priority. As part of our continuing mission  to provide you with exceptional heart care, we have created designated Provider Care Teams. These Care Teams include your primary Cardiologist (physician) and Advanced Practice Providers (APPs- Physician Assistants and Nurse Practitioners) who all work together to provide you with the care you need, when you need it.   You may see any of the following providers on your designated Care Team at your next follow up: Dr Toribio Fuel Dr Ezra Shuck Dr. Morene Brownie Greig Mosses, NP Caffie Shed, [place] [facility] [place], [place] Beckey Coe, NP Jordan Lee, NP Ellouise Class, NP Tinnie Redman, PharmD Jaun Bash, PharmD   Please be sure to bring in all your medications bottles to every appointment.    Thank you for choosing [facility]

## 2024-04-02 NOTE — Progress Notes (Signed)
 ReDS Vest / Clip - 04/02/24 1056       ReDS Vest / Clip   Station Marker C    Ruler Value 31.5    ReDS Value Range Low volume    ReDS Actual Value 31

## 2024-04-04 NOTE — Addendum Note (Signed)
 Encounter addended by: Buell Powell HERO, RN on: 04/04/2024 12:31 PM  Actions taken: Visit diagnoses modified

## 2024-04-05 ENCOUNTER — Encounter: Payer: Self-pay | Admitting: Oncology

## 2024-04-05 NOTE — Telephone Encounter (Signed)
 Encounter opened in error.

## 2024-04-08 ENCOUNTER — Other Ambulatory Visit (HOSPITAL_COMMUNITY): Payer: Self-pay

## 2024-04-09 ENCOUNTER — Ambulatory Visit
Admission: RE | Admit: 2024-04-09 | Discharge: 2024-04-09 | Disposition: A | Discharge status: Home / Self Care | Source: Ambulatory Visit | Attending: Family | Admitting: Family

## 2024-04-09 DIAGNOSIS — Z1231 Encounter for screening mammogram for malignant neoplasm of breast: Secondary | ICD-10-CM | POA: Insufficient documentation

## 2024-04-10 ENCOUNTER — Other Ambulatory Visit
Admission: RE | Admit: 2024-04-10 | Discharge: 2024-04-10 | Disposition: A | Discharge status: Home / Self Care | Attending: Internal Medicine | Admitting: Internal Medicine

## 2024-04-10 ENCOUNTER — Ambulatory Visit (HOSPITAL_COMMUNITY): Payer: Self-pay | Admitting: Internal Medicine

## 2024-04-10 DIAGNOSIS — N1832 Chronic kidney disease, stage 3b: Secondary | ICD-10-CM | POA: Diagnosis not present

## 2024-04-10 DIAGNOSIS — D631 Anemia in chronic kidney disease: Secondary | ICD-10-CM | POA: Diagnosis not present

## 2024-04-10 DIAGNOSIS — E1122 Type 2 diabetes mellitus with diabetic chronic kidney disease: Secondary | ICD-10-CM | POA: Diagnosis not present

## 2024-04-10 DIAGNOSIS — N2581 Secondary hyperparathyroidism of renal origin: Secondary | ICD-10-CM | POA: Diagnosis not present

## 2024-04-10 DIAGNOSIS — I10 Essential (primary) hypertension: Secondary | ICD-10-CM | POA: Diagnosis not present

## 2024-04-10 DIAGNOSIS — I270 Primary pulmonary hypertension: Secondary | ICD-10-CM | POA: Insufficient documentation

## 2024-04-10 DIAGNOSIS — N189 Chronic kidney disease, unspecified: Secondary | ICD-10-CM | POA: Diagnosis not present

## 2024-04-10 DIAGNOSIS — E876 Hypokalemia: Secondary | ICD-10-CM | POA: Diagnosis not present

## 2024-04-10 DIAGNOSIS — I5032 Chronic diastolic (congestive) heart failure: Secondary | ICD-10-CM

## 2024-04-10 LAB — CBC
HCT: 46.4 % — ABNORMAL HIGH (ref 36.0–46.0)
Hemoglobin: 15.3 g/dL — ABNORMAL HIGH (ref 12.0–15.0)
MCH: 29.4 pg (ref 26.0–34.0)
MCHC: 33 g/dL (ref 30.0–36.0)
MCV: 89.2 fL (ref 80.0–100.0)
Platelets: 120 K/uL — ABNORMAL LOW (ref 150–400)
RBC: 5.2 MIL/uL — ABNORMAL HIGH (ref 3.87–5.11)
RDW: 16.1 % — ABNORMAL HIGH (ref 11.5–15.5)
WBC: 5.3 K/uL (ref 4.0–10.5)
nRBC: 0 % (ref 0.0–0.2)

## 2024-04-10 LAB — BASIC METABOLIC PANEL WITH GFR
Anion gap: 12 (ref 5–15)
BUN: 26 mg/dL — ABNORMAL HIGH (ref 8–23)
CO2: 23 mmol/L (ref 22–32)
Calcium: 8.9 mg/dL (ref 8.9–10.3)
Chloride: 104 mmol/L (ref 98–111)
Creatinine, Ser: 1.33 mg/dL — ABNORMAL HIGH (ref 0.44–1.00)
GFR, Estimated: 41 mL/min — ABNORMAL LOW (ref >60)
Glucose, Bld: 79 mg/dL (ref 70–99)
Potassium: 3 mmol/L — ABNORMAL LOW (ref 3.5–5.1)
Sodium: 139 mmol/L (ref 135–145)

## 2024-04-10 LAB — BRAIN NATRIURETIC PEPTIDE: B Natriuretic Peptide: 101.6 pg/mL — ABNORMAL HIGH (ref 0.0–100.0)

## 2024-04-10 NOTE — Addendum Note (Signed)
 Addended by: ALEX SLOUGH C on: 04/10/2024 02:29 PM   Modules accepted: Orders

## 2024-04-12 ENCOUNTER — Other Ambulatory Visit: Payer: Self-pay | Admitting: Family

## 2024-04-12 DIAGNOSIS — R928 Other abnormal and inconclusive findings on diagnostic imaging of breast: Secondary | ICD-10-CM

## 2024-04-16 ENCOUNTER — Ambulatory Visit: Admitting: Family

## 2024-04-16 ENCOUNTER — Encounter: Payer: Self-pay | Admitting: Cardiology

## 2024-04-17 ENCOUNTER — Ambulatory Visit
Admission: RE | Admit: 2024-04-17 | Discharge: 2024-04-17 | Disposition: A | Discharge status: Home / Self Care | Source: Ambulatory Visit | Attending: Family | Admitting: Family

## 2024-04-17 DIAGNOSIS — R928 Other abnormal and inconclusive findings on diagnostic imaging of breast: Secondary | ICD-10-CM | POA: Insufficient documentation

## 2024-04-17 DIAGNOSIS — R92322 Mammographic fibroglandular density, left breast: Secondary | ICD-10-CM | POA: Diagnosis not present

## 2024-04-19 ENCOUNTER — Ambulatory Visit (HOSPITAL_COMMUNITY)
Admission: RE | Admit: 2024-04-19 | Discharge: 2024-04-19 | Disposition: A | Discharge status: Home / Self Care | Source: Ambulatory Visit | Attending: Internal Medicine | Admitting: Internal Medicine

## 2024-04-19 DIAGNOSIS — I270 Primary pulmonary hypertension: Secondary | ICD-10-CM | POA: Diagnosis not present

## 2024-04-19 DIAGNOSIS — J449 Chronic obstructive pulmonary disease, unspecified: Secondary | ICD-10-CM | POA: Insufficient documentation

## 2024-04-19 DIAGNOSIS — I5032 Chronic diastolic (congestive) heart failure: Secondary | ICD-10-CM | POA: Insufficient documentation

## 2024-04-19 DIAGNOSIS — G4730 Sleep apnea, unspecified: Secondary | ICD-10-CM | POA: Insufficient documentation

## 2024-04-19 DIAGNOSIS — E1122 Type 2 diabetes mellitus with diabetic chronic kidney disease: Secondary | ICD-10-CM | POA: Insufficient documentation

## 2024-04-19 DIAGNOSIS — N189 Chronic kidney disease, unspecified: Secondary | ICD-10-CM | POA: Diagnosis not present

## 2024-04-19 DIAGNOSIS — E785 Hyperlipidemia, unspecified: Secondary | ICD-10-CM | POA: Insufficient documentation

## 2024-04-19 LAB — ECHOCARDIOGRAM COMPLETE
Area-P 1/2: 1.86 cm2
S' Lateral: 2.2 cm
Single Plane A4C EF: 51.4 %

## 2024-04-19 NOTE — Progress Notes (Signed)
  Echocardiogram 2D Echocardiogram has been performed.  Aden Sek 04/19/2024, 9:46 AM

## 2024-04-19 NOTE — Addendum Note (Signed)
 Addended by: MICAEL PORTO A on: 04/19/2024 02:24 PM   Modules accepted: Orders

## 2024-04-22 ENCOUNTER — Telehealth (HOSPITAL_COMMUNITY): Payer: Self-pay | Admitting: *Deleted

## 2024-04-22 NOTE — Telephone Encounter (Signed)
 Called patient per Beckey Coe, NP with following echo results:  EF 65-70%, LV with no RWMA, no MR. RV mildly reduced.  Pt verbalized understanding of same. No further questions at this time.

## 2024-04-25 ENCOUNTER — Ambulatory Visit: Admitting: Family

## 2024-04-26 ENCOUNTER — Ambulatory Visit: Admitting: Family

## 2024-04-26 ENCOUNTER — Encounter: Payer: Self-pay | Admitting: Family

## 2024-04-26 VITALS — BP 108/78 | Ht 67.0 in | Wt 154.4 lb

## 2024-04-26 DIAGNOSIS — E1122 Type 2 diabetes mellitus with diabetic chronic kidney disease: Secondary | ICD-10-CM

## 2024-04-26 DIAGNOSIS — J849 Interstitial pulmonary disease, unspecified: Secondary | ICD-10-CM

## 2024-04-26 DIAGNOSIS — R197 Diarrhea, unspecified: Secondary | ICD-10-CM

## 2024-04-26 DIAGNOSIS — E039 Hypothyroidism, unspecified: Secondary | ICD-10-CM

## 2024-04-26 DIAGNOSIS — Z794 Long term (current) use of insulin: Secondary | ICD-10-CM | POA: Diagnosis not present

## 2024-04-26 DIAGNOSIS — E559 Vitamin D deficiency, unspecified: Secondary | ICD-10-CM | POA: Diagnosis not present

## 2024-04-26 DIAGNOSIS — E538 Deficiency of other specified B group vitamins: Secondary | ICD-10-CM

## 2024-04-26 DIAGNOSIS — E782 Mixed hyperlipidemia: Secondary | ICD-10-CM | POA: Diagnosis not present

## 2024-04-26 DIAGNOSIS — N1832 Chronic kidney disease, stage 3b: Secondary | ICD-10-CM

## 2024-04-26 DIAGNOSIS — Z0130 Encounter for examination of blood pressure without abnormal findings: Secondary | ICD-10-CM

## 2024-04-26 LAB — POCT CBG (FASTING - GLUCOSE)-MANUAL ENTRY: Glucose Fasting, POC: 105 mg/dL — AB (ref 70–99)

## 2024-04-26 NOTE — Progress Notes (Unsigned)
 Established Patient Office Visit  Subjective:  Patient ID: Jamie Murray, female    DOB: 1946/01/21  Age: 78 y.o. MRN: [id number]  Chief Complaint  Patient presents with  . Follow-up    3 month follow up    HPI  No other concerns at this time.   Past Medical History:  Diagnosis Date  . Abnormal electrocardiogram 11/25/2013   Formatting of this note might be different from the original. Last Assessment & Plan: Resolved when went to the ER.  Will f/u with Dr. Fernand Note: Unchanged Formatting of this note might be different from the original. Note: Unchanged Formatting of this note might be different from the original.  Formatting of this note might be different from the original. Last Assessment & Plan: Resolved when we  . Allergy   . Anemia   . Anxiety   . Bilateral hand pain 07/25/2018  . Cerebral infarction, unspecified (HCC) 01/26/2015  . Chest pain 08/09/2016  . Chest pain, rule out acute myocardial infarction 08/09/2016  . Chronic diastolic heart failure (HCC)   . COPD (chronic obstructive pulmonary disease) (HCC)   . Depression   . Diabetes (HCC)   . Diarrhea, functional 01/25/2016  . Edema of lower extremity 02/03/2020  . Elevated troponin level 01/09/2020  . Enlarged RV (right ventricle)   . GERD (gastroesophageal reflux disease)   . Hepatic steatosis   . History of stroke 08/26/2022   Formatting of this note might be different from the original. Last Assessment & Plan:  Formatting of this note might be different from the original.  Continue ASA, statin    . Hyperlipidemia   . Hypertension   . Hypokalemia 12/22/2015  . Hypomagnesemia 03/22/2016  . Insomnia   . Macrocytosis 01/25/2016  . Migraines   . Nutmeg liver   . Osteoporosis   . Palpitations 11/25/2013   Formatting of this note might be different from the original. Note: Unchanged  . Pulmonary hypertension (HCC)   . [facility] spotted fever   . Severe pulmonary arterial systolic  hypertension (HCC)   . Severe tricuspid regurgitation by prior echocardiogram   . Sleep apnea    CPAP  . Transient cerebral ischemia 2016  . Type 2 diabetes mellitus with hyperglycemia (HCC) 02/14/2012   Formatting of this note might be different from the original. Last Assessment & Plan: Hgb A1C is 7.1%.  Don't make any changes now.  Will hopefully increase exercise when pulmonary hypertension is properly treated.  ? Pulmonary rehab Note: Unchanged Formatting of this note might be different from the original. Formatting of this note might be different from the original.  Formatting of this note   . Vertigo    every 2-3 months    Past Surgical History:  Procedure Laterality Date  . ABDOMINAL HYSTERECTOMY    . CORONARY ANGIOPLASTY    . ESOPHAGOGASTRODUODENOSCOPY (EGD) WITH PROPOFOL  N/A 08/24/2015   Procedure: ESOPHAGOGASTRODUODENOSCOPY (EGD) WITH PROPOFOL  with dialation;  Surgeon: Rogelia Copping, MD;  Location: [facility];  Service: Endoscopy;  Laterality: N/A;  Diabetic - oral meds  . RIGHT HEART CATH N/A 08/24/2023   Procedure: RIGHT HEART CATH;  Surgeon: Rolan Ezra RAMAN, MD;  Location: [facility] INVASIVE CV LAB;  Service: Cardiovascular;  Laterality: N/A;  . RIGHT/LEFT HEART CATH AND CORONARY/GRAFT ANGIOGRAPHY N/A 01/13/2020   Procedure: RIGHT/LEFT HEART CATH AND CORONARY/GRAFT ANGIOGRAPHY;  Surgeon: Darron Deatrice LABOR, MD;  Location: [facility] INVASIVE CV LAB;  Service: Cardiovascular;  Laterality: N/A;  . TUBAL LIGATION  Social History   Socioeconomic History  . Marital status: Widowed    Spouse name: Rutha  . Number of children: 3  . Years of education: 99  . Highest education level: Not on file  Occupational History    Comment: work part time, day care  . Occupation: Retired  Tobacco Use  . Smoking status: Never  . Smokeless tobacco: Never  Vaping Use  . Vaping status: Never Used  Substance and Sexual Activity  . Alcohol use: No    Alcohol/week: 0.0 standard drinks of  alcohol  . Drug use: No  . Sexual activity: Not Currently    Birth control/protection: None, Post-menopausal  Other Topics Concern  . Not on file  Social History Narrative   Caffeine use- coffee 2 cups daily, Coke once a week, green tea daily   Social Drivers of Health   Financial Resource Strain: Not on file  Food Insecurity: No Food Insecurity (05/15/2023)   Hunger Vital Sign   . Worried About Programme Researcher, Broadcasting/film/video in the Last Year: Never true   . Ran Out of Food in the Last Year: Never true  Transportation Needs: No Transportation Needs (05/15/2023)   PRAPARE - Transportation   . Lack of Transportation (Medical): No   . Lack of Transportation (Non-Medical): No  Physical Activity: Not on file  Stress: Not on file  Social Connections: Not on file  Intimate Partner Violence: Not At Risk (05/15/2023)   Humiliation, Afraid, Rape, and Kick questionnaire   . Fear of Current or Ex-Partner: No   . Emotionally Abused: No   . Physically Abused: No   . Sexually Abused: No    Family History  Problem Relation Age of Onset  . Diabetes Mother   . Hypertension Mother   . Stroke Mother   . Heart disease Mother        MI  . Cancer Father        pancreatic  . Stroke Father   . Hypertension Sister   . Diabetes Brother   . Diabetes Maternal Grandfather   . Diabetes Sister   . Diabetes Sister   . Diabetes Brother   . Diabetes Brother     Allergies  Allergen Reactions  . Atorvastatin      Note: joint pain  . Fluoxetine Other (See Comments)    felt crazy  . Levemir  [Insulin  Detemir] Other (See Comments)    Bruising  . Prozac [Fluoxetine Hcl] Other (See Comments)    felt crazy    Review of Systems  All other systems reviewed and are negative.      Objective:   BP 108/78   Ht 5' 7 (1.702 m)   Wt 154 lb 6.4 oz (70 kg)   LMP  (LMP Unknown)   BMI 24.18 kg/m   Vitals:   04/26/24 1005  BP: 108/78  Height: 5' 7 (1.702 m)  Weight: 154 lb 6.4 oz (70 kg)  BMI  (Calculated): 24.18    Physical Exam Vitals and nursing note reviewed.  Constitutional:      Appearance: Normal appearance. She is normal weight.  HENT:     Head: Normocephalic.  Eyes:     Extraocular Movements: Extraocular movements intact.     Conjunctiva/sclera: Conjunctivae normal.     Pupils: Pupils are equal, round, and reactive to light.  Cardiovascular:     Rate and Rhythm: Normal rate.  Pulmonary:     Effort: Pulmonary effort is normal.  Neurological:     General:  No focal deficit present.     Mental Status: She is alert and oriented to person, place, and time. Mental status is at baseline.  Psychiatric:        Mood and Affect: Mood normal.        Behavior: Behavior normal.        Thought Content: Thought content normal.      Results for orders placed or performed in visit on 04/26/24  POCT CBG (Fasting - Glucose)  Result Value Ref Range   Glucose Fasting, POC 105 (A) 70 - 99 mg/dL    Recent Results (from the past 2160 hours)  Basic metabolic panel with GFR     Status: Abnormal   Collection Time: 01/29/24 11:40 AM  Result Value Ref Range   Sodium 141 135 - 145 mmol/L   Potassium 4.1 3.5 - 5.1 mmol/L   Chloride 103 98 - 111 mmol/L   CO2 24 22 - 32 mmol/L   Glucose, Bld 82 70 - 99 mg/dL    Comment: Glucose reference range applies only to samples taken after fasting for at least 8 hours.   BUN 25 (H) 8 - 23 mg/dL   Creatinine, Ser 8.47 (H) 0.44 - 1.00 mg/dL   Calcium  9.1 8.9 - 10.3 mg/dL   GFR, Estimated 35 (L) >60 mL/min    Comment: (NOTE) Calculated using the CKD-EPI Creatinine Equation (2021)    Anion gap 14 5 - 15    Comment: Performed at [facility], [address] [place], [place] [address]  Basic Metabolic Panel (BMET)     Status: Abnormal   Collection Time: 04/10/24  2:33 PM  Result Value Ref Range   Sodium 139 135 - 145 mmol/L   Potassium 3.0 (L) 3.5 - 5.1 mmol/L   Chloride 104 98 - 111 mmol/L   CO2 23 22 - 32 mmol/L   Glucose,  Bld 79 70 - 99 mg/dL    Comment: Glucose reference range applies only to samples taken after fasting for at least 8 hours.   BUN 26 (H) 8 - 23 mg/dL   Creatinine, Ser 8.66 (H) 0.44 - 1.00 mg/dL   Calcium  8.9 8.9 - 10.3 mg/dL   GFR, Estimated 41 (L) >60 mL/min    Comment: (NOTE) Calculated using the CKD-EPI Creatinine Equation (2021)    Anion gap 12 5 - 15    Comment: Performed at [facility], [address] [place], [place] [address]  B Nat Peptide     Status: Abnormal   Collection Time: 04/10/24  2:33 PM  Result Value Ref Range   B Natriuretic Peptide 101.6 (H) 0.0 - 100.0 pg/mL    Comment: Performed at [facility], [address] [place], [place] [address]  CBC     Status: Abnormal   Collection Time: 04/10/24  2:33 PM  Result Value Ref Range   WBC 5.3 4.0 - 10.5 K/uL   RBC 5.20 (H) 3.87 - 5.11 MIL/uL   Hemoglobin 15.3 (H) 12.0 - 15.0 g/dL   HCT 53.5 (H) 63.9 - 53.9 %   MCV 89.2 80.0 - 100.0 fL   MCH 29.4 26.0 - 34.0 pg   MCHC 33.0 30.0 - 36.0 g/dL   RDW 83.8 (H) 88.4 - 84.4 %   Platelets 120 (L) 150 - 400 K/uL   nRBC 0.0 0.0 - 0.2 %    Comment: Performed at [facility], [address] [place], [place] [address]  ECHOCARDIOGRAM COMPLETE  Status: None   Collection Time: 04/19/24  9:44 AM  Result Value Ref Range   S' Lateral 2.20 cm   Single Plane A4C EF 51.4 %   Area-P 1/2 1.86 cm2   Est EF 65 - 70%   POCT CBG (Fasting - Glucose)     Status: Abnormal   Collection Time: 04/26/24 10:19 AM  Result Value Ref Range   Glucose Fasting, POC 105 (A) 70 - 99 mg/dL       Assessment & Plan:   Assessment & Plan Type 2 diabetes mellitus with stage 3b chronic kidney disease, with long-term current use of insulin  (HCC)  Chronic kidney disease, stage 3b (HCC)  Mixed hyperlipidemia  Interstitial pulmonary disease (HCC)  Acquired hypothyroidism  Vitamin D  deficiency, unspecified  B12 deficiency due to diet     Return in about 4 months  (around 08/24/2024).   Total time spent: {AMA time spent:29001} minutes  ALAN CHRISTELLA ARRANT, FNP  04/26/2024   This document may have been prepared by [facility] Voice Recognition software and as such may include unintentional dictation errors.

## 2024-04-27 ENCOUNTER — Encounter: Payer: Self-pay | Admitting: Family

## 2024-04-27 LAB — CMP14+EGFR
ALT: 17 IU/L (ref 0–32)
AST: 21 IU/L (ref 0–40)
Albumin: 4.7 g/dL (ref 3.8–4.8)
Alkaline Phosphatase: 120 IU/L (ref 49–135)
BUN/Creatinine Ratio: 24 (ref 12–28)
BUN: 51 mg/dL — ABNORMAL HIGH (ref 8–27)
Bilirubin Total: 0.5 mg/dL (ref 0.0–1.2)
CO2: 18 mmol/L — ABNORMAL LOW (ref 20–29)
Calcium: 9.8 mg/dL (ref 8.7–10.3)
Chloride: 101 mmol/L (ref 96–106)
Creatinine, Ser: 2.09 mg/dL — ABNORMAL HIGH (ref 0.57–1.00)
Globulin, Total: 2.9 g/dL (ref 1.5–4.5)
Glucose: 102 mg/dL — ABNORMAL HIGH (ref 70–99)
Potassium: 4.7 mmol/L (ref 3.5–5.2)
Sodium: 143 mmol/L (ref 134–144)
Total Protein: 7.6 g/dL (ref 6.0–8.5)
eGFR: 24 mL/min/1.73 — ABNORMAL LOW (ref >59)

## 2024-04-27 LAB — CBC WITH DIFFERENTIAL/PLATELET
Basophils Absolute: 0 x10E3/uL (ref 0.0–0.2)
Basos: 0 %
EOS (ABSOLUTE): 0 x10E3/uL (ref 0.0–0.4)
Eos: 1 %
Hematocrit: 53.1 % — ABNORMAL HIGH (ref 34.0–46.6)
Hemoglobin: 17.1 g/dL — ABNORMAL HIGH (ref 11.1–15.9)
Immature Grans (Abs): 0 x10E3/uL (ref 0.0–0.1)
Immature Granulocytes: 0 %
Lymphocytes Absolute: 1.7 x10E3/uL (ref 0.7–3.1)
Lymphs: 35 %
MCH: 29.2 pg (ref 26.6–33.0)
MCHC: 32.2 g/dL (ref 31.5–35.7)
MCV: 91 fL (ref 79–97)
Monocytes Absolute: 0.2 x10E3/uL (ref 0.1–0.9)
Monocytes: 4 %
Neutrophils Absolute: 2.9 x10E3/uL (ref 1.4–7.0)
Neutrophils: 60 %
Platelets: 132 x10E3/uL — ABNORMAL LOW (ref 150–450)
RBC: 5.86 x10E6/uL — ABNORMAL HIGH (ref 3.77–5.28)
RDW: 15.8 % — ABNORMAL HIGH (ref 11.7–15.4)
WBC: 4.8 x10E3/uL (ref 3.4–10.8)

## 2024-04-27 LAB — VITAMIN B12: Vitamin B-12: 570 pg/mL (ref 232–1245)

## 2024-04-27 LAB — VITAMIN D 25 HYDROXY (VIT D DEFICIENCY, FRACTURES): Vit D, 25-Hydroxy: 39 ng/mL (ref 30.0–100.0)

## 2024-04-27 LAB — LIPID PANEL
Chol/HDL Ratio: 4.2 ratio (ref 0.0–4.4)
Cholesterol, Total: 173 mg/dL (ref 100–199)
HDL: 41 mg/dL (ref >39)
LDL Chol Calc (NIH): 105 mg/dL — ABNORMAL HIGH (ref 0–99)
Triglycerides: 155 mg/dL — ABNORMAL HIGH (ref 0–149)
VLDL Cholesterol Cal: 27 mg/dL (ref 5–40)

## 2024-04-27 LAB — TSH: TSH: 2.76 u[IU]/mL (ref 0.450–4.500)

## 2024-04-27 LAB — HEMOGLOBIN A1C
Est. average glucose Bld gHb Est-mCnc: 143 mg/dL
Hgb A1c MFr Bld: 6.6 % — ABNORMAL HIGH (ref 4.8–5.6)

## 2024-04-27 NOTE — Assessment & Plan Note (Signed)
 Checking labs today.  Continue current therapy for lipid control. Will modify as needed based on labwork results.   -CMP w/eGFR -Lipid Panel

## 2024-04-27 NOTE — Assessment & Plan Note (Signed)
 Checking labs today.  Will continue supplements as needed.   - Vitamin D  - Vitamin B12 - TSH

## 2024-04-27 NOTE — Assessment & Plan Note (Signed)
 Pt given 75mg  Viberzi samples.  Will see if this is helpful and she will let me know.  If so, will send RX.

## 2024-04-27 NOTE — Assessment & Plan Note (Signed)
 Checking labs today. Will call pt. With results  Continue current diabetes POC, as patient has been well controlled on current regimen.  Will adjust meds if needed based on labs.   -CBC w/Diff -CMP w/eGFR -Hemoglobin A1C

## 2024-04-27 NOTE — Assessment & Plan Note (Signed)
Patient is seen by nephrology, who manage this condition.  She is well controlled with current therapy.   Will defer to them for further changes to plan of care.

## 2024-04-27 NOTE — Assessment & Plan Note (Signed)
 Patient is seen by pulmonary, who manage this condition.  She is well controlled with current therapy.   Will defer to them for further changes to plan of care.

## 2024-04-29 ENCOUNTER — Telehealth (HOSPITAL_COMMUNITY): Payer: Self-pay

## 2024-04-29 ENCOUNTER — Other Ambulatory Visit (HOSPITAL_COMMUNITY): Payer: Self-pay

## 2024-04-29 ENCOUNTER — Encounter: Payer: Self-pay | Admitting: Oncology

## 2024-04-29 NOTE — Telephone Encounter (Signed)
 Advanced Heart Failure Patient Advocate Encounter  Prior authorization for Winrevair  has been submitted and approved. Test billing returns $0 for 21 day supply.  Key: [id number] Effective: 06/07/2023 to 06/05/2025  Rachel DEL, CPhT Rx Patient Advocate Phone: [phone]

## 2024-05-14 ENCOUNTER — Other Ambulatory Visit: Payer: Self-pay | Admitting: Family

## 2024-05-17 ENCOUNTER — Other Ambulatory Visit: Payer: Self-pay | Admitting: Family

## 2024-05-21 ENCOUNTER — Other Ambulatory Visit: Payer: Self-pay | Admitting: Family

## 2024-05-21 DIAGNOSIS — G4709 Other insomnia: Secondary | ICD-10-CM

## 2024-06-17 ENCOUNTER — Other Ambulatory Visit: Payer: Self-pay | Admitting: Cardiology

## 2024-06-17 ENCOUNTER — Other Ambulatory Visit: Payer: Self-pay | Admitting: Family

## 2024-06-17 DIAGNOSIS — I5032 Chronic diastolic (congestive) heart failure: Secondary | ICD-10-CM

## 2024-06-18 ENCOUNTER — Other Ambulatory Visit (HOSPITAL_COMMUNITY): Payer: Self-pay | Admitting: Cardiology

## 2024-06-24 ENCOUNTER — Telehealth (HOSPITAL_COMMUNITY): Payer: Self-pay

## 2024-06-24 DIAGNOSIS — I5032 Chronic diastolic (congestive) heart failure: Secondary | ICD-10-CM

## 2024-06-24 NOTE — Telephone Encounter (Signed)
 Patient called stating that she needs refill of potassium 20meq daily- reports this is what she has been taking. But patients current med list has potassium 10meq daily.   Per patient's BMP results on 04/10/24  Beckey LITTIE Coe, NP 04/10/2024  4:06 PM EST     Fluid marker mildly elevated. Now on Torsemide . K low. Please have her take 40 mEq KDUR x1 and increase daily dose to 20 mEq. Please have PCP draw BMET at follow up 11/11.   Repeat labs drawn at PCP on 11/21-- as seen below    Latest Ref Rng & Units 04/26/2024   11:00 AM 04/10/2024    2:33 PM 01/29/2024   11:40 AM  CMP  Glucose 70 - 99 mg/dL 897  79  82   BUN 8 - 27 mg/dL 51  26  25   Creatinine 0.57 - 1.00 mg/dL 7.90  8.66  8.47   Sodium 134 - 144 mmol/L 143  139  141   Potassium 3.5 - 5.2 mmol/L 4.7  3.0  4.1   Chloride 96 - 106 mmol/L 101  104  103   CO2 20 - 29 mmol/L 18  23  24    Calcium  8.7 - 10.3 mg/dL 9.8  8.9  9.1   Total Protein 6.0 - 8.5 g/dL 7.6     Total Bilirubin 0.0 - 1.2 mg/dL 0.5     Alkaline Phos 49 - 135 IU/L 120     AST 0 - 40 IU/L 21     ALT 0 - 32 IU/L 17      Will send to provider to see if patient should remain on potassium 20meq daily?

## 2024-06-26 ENCOUNTER — Ambulatory Visit (HOSPITAL_COMMUNITY): Payer: Self-pay | Admitting: Internal Medicine

## 2024-06-26 ENCOUNTER — Ambulatory Visit (HOSPITAL_COMMUNITY)

## 2024-06-26 ENCOUNTER — Other Ambulatory Visit
Admission: RE | Admit: 2024-06-26 | Discharge: 2024-06-26 | Disposition: A | Discharge status: Home / Self Care | Source: Ambulatory Visit | Attending: Cardiology | Admitting: Cardiology

## 2024-06-26 DIAGNOSIS — I5032 Chronic diastolic (congestive) heart failure: Secondary | ICD-10-CM | POA: Diagnosis present

## 2024-06-26 LAB — BASIC METABOLIC PANEL WITH GFR
Anion gap: 13 (ref 5–15)
BUN: 27 mg/dL — ABNORMAL HIGH (ref 8–23)
CO2: 24 mmol/L (ref 22–32)
Calcium: 9.2 mg/dL (ref 8.9–10.3)
Chloride: 104 mmol/L (ref 98–111)
Creatinine, Ser: 1.62 mg/dL — ABNORMAL HIGH (ref 0.44–1.00)
GFR, Estimated: 32 mL/min — ABNORMAL LOW
Glucose, Bld: 124 mg/dL — ABNORMAL HIGH (ref 70–99)
Potassium: 3.4 mmol/L — ABNORMAL LOW (ref 3.5–5.1)
Sodium: 140 mmol/L (ref 135–145)

## 2024-06-26 NOTE — Telephone Encounter (Signed)
 Spoke with patient, she will go today and have repeat BMP done at [facility]. Order placed.   Advised patient to call back to office with any issues, questions, or concerns. Patient verbalized understanding.

## 2024-06-27 MED ORDER — POTASSIUM CHLORIDE CRYS ER 20 MEQ PO TBCR: 20.0000 meq | EXTENDED_RELEASE_TABLET | Freq: Every day | ORAL | 5 refills | Status: AC

## 2024-07-03 ENCOUNTER — Telehealth (HOSPITAL_COMMUNITY): Payer: Self-pay

## 2024-07-05 ENCOUNTER — Other Ambulatory Visit (HOSPITAL_COMMUNITY): Payer: Self-pay

## 2024-07-05 NOTE — Telephone Encounter (Signed)
 Advanced Heart Failure Patient Advocate Encounter  Prior authorization for Orenitram  ER 0.25 MG has been submitted and approved. Test billing returns refill too soon rejection, unable to confirm copay at this time Approval notification by fax  Effective: 07/04/2024 to 06/05/2025  Rachel DEL, CPhT Rx Patient Advocate Phone: [phone]

## 2024-07-15 ENCOUNTER — Ambulatory Visit (HOSPITAL_COMMUNITY): Admitting: Cardiology

## 2024-08-02 ENCOUNTER — Other Ambulatory Visit

## 2024-08-12 ENCOUNTER — Ambulatory Visit: Admitting: Oncology

## 2024-08-21 ENCOUNTER — Other Ambulatory Visit

## 2024-08-26 ENCOUNTER — Ambulatory Visit: Admitting: Family

## 2024-08-28 ENCOUNTER — Ambulatory Visit: Admitting: Oncology
